# Patient Record
Sex: Female | Born: 1945 | Race: White | Hispanic: No | Marital: Married | Smoking: Never smoker
Health system: Southern US, Community
[De-identification: ages and names within clinical notes are randomized; demographics above are authoritative.]

## PROBLEM LIST (undated history)

## (undated) DIAGNOSIS — F32A Depression, unspecified: Secondary | ICD-10-CM

## (undated) DIAGNOSIS — E876 Hypokalemia: Secondary | ICD-10-CM

## (undated) DIAGNOSIS — I10 Essential (primary) hypertension: Secondary | ICD-10-CM

## (undated) DIAGNOSIS — E785 Hyperlipidemia, unspecified: Secondary | ICD-10-CM

## (undated) DIAGNOSIS — N83209 Unspecified ovarian cyst, unspecified side: Secondary | ICD-10-CM

## (undated) DIAGNOSIS — S069X9A Unspecified intracranial injury with loss of consciousness of unspecified duration, initial encounter: Secondary | ICD-10-CM

## (undated) DIAGNOSIS — S069XAA Unspecified intracranial injury with loss of consciousness status unknown, initial encounter: Secondary | ICD-10-CM

## (undated) DIAGNOSIS — E039 Hypothyroidism, unspecified: Secondary | ICD-10-CM

## (undated) DIAGNOSIS — K219 Gastro-esophageal reflux disease without esophagitis: Secondary | ICD-10-CM

## (undated) DIAGNOSIS — K589 Irritable bowel syndrome without diarrhea: Secondary | ICD-10-CM

## (undated) DIAGNOSIS — M62830 Muscle spasm of back: Secondary | ICD-10-CM

## (undated) DIAGNOSIS — G4730 Sleep apnea, unspecified: Secondary | ICD-10-CM

## (undated) DIAGNOSIS — J310 Chronic rhinitis: Secondary | ICD-10-CM

## (undated) DIAGNOSIS — N951 Menopausal and female climacteric states: Secondary | ICD-10-CM

## (undated) DIAGNOSIS — H8110 Benign paroxysmal vertigo, unspecified ear: Secondary | ICD-10-CM

## (undated) DIAGNOSIS — F329 Major depressive disorder, single episode, unspecified: Secondary | ICD-10-CM

## (undated) DIAGNOSIS — I509 Heart failure, unspecified: Secondary | ICD-10-CM

## (undated) DIAGNOSIS — N3281 Overactive bladder: Secondary | ICD-10-CM

## (undated) DIAGNOSIS — M1990 Unspecified osteoarthritis, unspecified site: Secondary | ICD-10-CM

## (undated) HISTORY — PX: REDUCTION MAMMAPLASTY: SUR839

## (undated) HISTORY — PX: OTHER SURGICAL HISTORY: SHX169

## (undated) HISTORY — PX: CATARACT EXTRACTION: SUR2

## (undated) HISTORY — PX: SHOULDER OPEN ROTATOR CUFF REPAIR: SHX2407

## (undated) HISTORY — PX: ANKLE RECONSTRUCTION: SHX1151

## (undated) HISTORY — PX: CHOLECYSTECTOMY: SHX55

## (undated) HISTORY — PX: SOFT TISSUE TUMOR RESECTION: SHX1054

## (undated) HISTORY — DX: Irritable bowel syndrome, unspecified: K58.9

## (undated) HISTORY — PX: JOINT REPLACEMENT: SHX530

## (undated) HISTORY — PX: BRACHIOPLASTY: SUR162

## (undated) HISTORY — PX: TOE SURGERY: SHX1073

---

## 2004-04-02 ENCOUNTER — Ambulatory Visit: Payer: Self-pay

## 2005-06-02 ENCOUNTER — Ambulatory Visit: Payer: Self-pay | Admitting: Internal Medicine

## 2005-07-22 ENCOUNTER — Ambulatory Visit: Payer: Self-pay | Admitting: Podiatry

## 2005-08-24 ENCOUNTER — Other Ambulatory Visit: Payer: Self-pay

## 2005-08-28 ENCOUNTER — Inpatient Hospital Stay: Payer: Self-pay | Admitting: Podiatry

## 2005-12-04 ENCOUNTER — Ambulatory Visit: Payer: Self-pay | Admitting: General Surgery

## 2006-06-11 ENCOUNTER — Ambulatory Visit: Payer: Self-pay | Admitting: General Practice

## 2006-08-13 ENCOUNTER — Ambulatory Visit: Payer: Self-pay | Admitting: Unknown Physician Specialty

## 2007-07-12 ENCOUNTER — Ambulatory Visit: Payer: Self-pay | Admitting: General Practice

## 2008-02-07 ENCOUNTER — Ambulatory Visit: Payer: Self-pay | Admitting: Internal Medicine

## 2008-02-22 ENCOUNTER — Ambulatory Visit: Payer: Self-pay | Admitting: Internal Medicine

## 2008-05-15 ENCOUNTER — Ambulatory Visit: Payer: Self-pay | Admitting: General Practice

## 2008-06-13 ENCOUNTER — Ambulatory Visit: Payer: Self-pay | Admitting: General Practice

## 2008-06-26 ENCOUNTER — Ambulatory Visit: Payer: Self-pay | Admitting: General Surgery

## 2008-07-03 ENCOUNTER — Ambulatory Visit: Payer: Self-pay | Admitting: General Surgery

## 2008-07-31 ENCOUNTER — Ambulatory Visit: Payer: Self-pay | Admitting: General Practice

## 2011-07-31 ENCOUNTER — Telehealth: Payer: Self-pay | Admitting: *Deleted

## 2011-07-31 NOTE — Telephone Encounter (Signed)
Pt states the ringing in her ears is still pretty bad and would like to know if you can send her something in for that.

## 2011-07-31 NOTE — Telephone Encounter (Signed)
Patient can try OTC anti-histamines such as benadryl, zyrtec, or claritin. Needs to have office visit if persists.

## 2011-08-03 NOTE — Telephone Encounter (Signed)
LM for pt

## 2014-07-12 HISTORY — PX: ABDOMINAL HYSTERECTOMY: SHX81

## 2015-03-25 DIAGNOSIS — N83209 Unspecified ovarian cyst, unspecified side: Secondary | ICD-10-CM

## 2015-03-25 HISTORY — DX: Unspecified ovarian cyst, unspecified side: N83.209

## 2015-04-10 DIAGNOSIS — M1712 Unilateral primary osteoarthritis, left knee: Secondary | ICD-10-CM | POA: Insufficient documentation

## 2015-08-21 DIAGNOSIS — M1711 Unilateral primary osteoarthritis, right knee: Secondary | ICD-10-CM | POA: Insufficient documentation

## 2016-03-04 DIAGNOSIS — E039 Hypothyroidism, unspecified: Secondary | ICD-10-CM | POA: Insufficient documentation

## 2016-03-04 DIAGNOSIS — H8110 Benign paroxysmal vertigo, unspecified ear: Secondary | ICD-10-CM

## 2016-03-04 DIAGNOSIS — I10 Essential (primary) hypertension: Secondary | ICD-10-CM | POA: Insufficient documentation

## 2016-03-04 DIAGNOSIS — E876 Hypokalemia: Secondary | ICD-10-CM | POA: Insufficient documentation

## 2016-03-04 DIAGNOSIS — S069X0S Unspecified intracranial injury without loss of consciousness, sequela: Secondary | ICD-10-CM | POA: Insufficient documentation

## 2016-03-04 DIAGNOSIS — F325 Major depressive disorder, single episode, in full remission: Secondary | ICD-10-CM | POA: Insufficient documentation

## 2016-03-04 DIAGNOSIS — Z0000 Encounter for general adult medical examination without abnormal findings: Secondary | ICD-10-CM | POA: Insufficient documentation

## 2016-03-04 DIAGNOSIS — R4189 Other symptoms and signs involving cognitive functions and awareness: Secondary | ICD-10-CM | POA: Insufficient documentation

## 2016-03-04 HISTORY — DX: Benign paroxysmal vertigo, unspecified ear: H81.10

## 2016-04-09 ENCOUNTER — Other Ambulatory Visit: Payer: Self-pay | Admitting: Obstetrics & Gynecology

## 2016-04-09 DIAGNOSIS — Z1239 Encounter for other screening for malignant neoplasm of breast: Secondary | ICD-10-CM

## 2016-04-09 DIAGNOSIS — N6315 Unspecified lump in the right breast, overlapping quadrants: Secondary | ICD-10-CM

## 2016-04-09 DIAGNOSIS — N6310 Unspecified lump in the right breast, unspecified quadrant: Secondary | ICD-10-CM

## 2016-04-28 ENCOUNTER — Other Ambulatory Visit: Payer: Self-pay | Admitting: *Deleted

## 2016-04-28 ENCOUNTER — Inpatient Hospital Stay
Admission: RE | Admit: 2016-04-28 | Discharge: 2016-04-28 | Disposition: A | Discharge status: Home / Self Care | Payer: Self-pay | Source: Ambulatory Visit | Attending: *Deleted | Admitting: *Deleted

## 2016-04-28 DIAGNOSIS — Z9289 Personal history of other medical treatment: Secondary | ICD-10-CM

## 2016-05-18 ENCOUNTER — Ambulatory Visit
Admission: RE | Admit: 2016-05-18 | Discharge: 2016-05-18 | Disposition: A | Discharge status: Home / Self Care | Payer: Medicare Other | Source: Ambulatory Visit | Attending: Obstetrics & Gynecology | Admitting: Obstetrics & Gynecology

## 2016-05-18 ENCOUNTER — Encounter: Payer: Self-pay | Admitting: Radiology

## 2016-05-18 DIAGNOSIS — N6315 Unspecified lump in the right breast, overlapping quadrants: Secondary | ICD-10-CM

## 2016-05-18 DIAGNOSIS — N6002 Solitary cyst of left breast: Secondary | ICD-10-CM | POA: Diagnosis not present

## 2016-05-18 DIAGNOSIS — N6310 Unspecified lump in the right breast, unspecified quadrant: Secondary | ICD-10-CM

## 2016-05-18 DIAGNOSIS — Z1231 Encounter for screening mammogram for malignant neoplasm of breast: Secondary | ICD-10-CM | POA: Diagnosis not present

## 2016-05-18 DIAGNOSIS — Z1239 Encounter for other screening for malignant neoplasm of breast: Secondary | ICD-10-CM

## 2016-06-03 DIAGNOSIS — Z6841 Body Mass Index (BMI) 40.0 and over, adult: Secondary | ICD-10-CM | POA: Insufficient documentation

## 2016-07-25 DIAGNOSIS — M545 Low back pain, unspecified: Secondary | ICD-10-CM | POA: Insufficient documentation

## 2016-09-16 DIAGNOSIS — M47816 Spondylosis without myelopathy or radiculopathy, lumbar region: Secondary | ICD-10-CM | POA: Insufficient documentation

## 2016-10-07 ENCOUNTER — Other Ambulatory Visit: Payer: Self-pay | Admitting: Orthopedic Surgery

## 2016-10-07 DIAGNOSIS — M48061 Spinal stenosis, lumbar region without neurogenic claudication: Secondary | ICD-10-CM

## 2016-10-28 ENCOUNTER — Ambulatory Visit
Admission: RE | Admit: 2016-10-28 | Discharge: 2016-10-28 | Disposition: A | Discharge status: Home / Self Care | Payer: Medicare Other | Source: Ambulatory Visit | Attending: Orthopedic Surgery | Admitting: Orthopedic Surgery

## 2016-10-28 DIAGNOSIS — M48061 Spinal stenosis, lumbar region without neurogenic claudication: Secondary | ICD-10-CM

## 2016-11-05 DIAGNOSIS — F419 Anxiety disorder, unspecified: Secondary | ICD-10-CM | POA: Insufficient documentation

## 2016-11-13 ENCOUNTER — Other Ambulatory Visit: Payer: Self-pay | Admitting: Orthopedic Surgery

## 2016-11-13 DIAGNOSIS — M9973 Connective tissue and disc stenosis of intervertebral foramina of lumbar region: Secondary | ICD-10-CM

## 2016-11-19 ENCOUNTER — Ambulatory Visit
Admission: RE | Admit: 2016-11-19 | Discharge: 2016-11-19 | Disposition: A | Discharge status: Home / Self Care | Payer: Medicare Other | Source: Ambulatory Visit | Attending: Orthopedic Surgery | Admitting: Orthopedic Surgery

## 2016-11-19 DIAGNOSIS — M9973 Connective tissue and disc stenosis of intervertebral foramina of lumbar region: Secondary | ICD-10-CM

## 2016-11-19 MED ORDER — IOPAMIDOL (ISOVUE-M 200) INJECTION 41%
1.0000 mL | Freq: Once | INTRAMUSCULAR | Status: AC
  Administered 2016-11-19: 1 mL via EPIDURAL

## 2016-11-19 MED ORDER — METHYLPREDNISOLONE ACETATE 40 MG/ML INJ SUSP (RADIOLOG
120.0000 mg | Freq: Once | INTRAMUSCULAR | Status: AC
  Administered 2016-11-19: 120 mg via EPIDURAL

## 2016-11-19 NOTE — Discharge Instructions (Signed)

## 2016-12-04 ENCOUNTER — Encounter: Payer: Self-pay | Admitting: *Deleted

## 2016-12-07 ENCOUNTER — Ambulatory Visit: Payer: Medicare Other | Admitting: Certified Registered Nurse Anesthetist

## 2016-12-07 ENCOUNTER — Ambulatory Visit
Admission: RE | Admit: 2016-12-07 | Discharge: 2016-12-07 | Disposition: A | Discharge status: Home / Self Care | Payer: Medicare Other | Source: Ambulatory Visit | Attending: Unknown Physician Specialty | Admitting: Unknown Physician Specialty

## 2016-12-07 ENCOUNTER — Encounter
Admission: RE | Disposition: A | Discharge status: Home / Self Care | Payer: Self-pay | Source: Ambulatory Visit | Attending: Unknown Physician Specialty

## 2016-12-07 DIAGNOSIS — Z79899 Other long term (current) drug therapy: Secondary | ICD-10-CM | POA: Diagnosis not present

## 2016-12-07 DIAGNOSIS — K5730 Diverticulosis of large intestine without perforation or abscess without bleeding: Secondary | ICD-10-CM | POA: Insufficient documentation

## 2016-12-07 DIAGNOSIS — K219 Gastro-esophageal reflux disease without esophagitis: Secondary | ICD-10-CM | POA: Diagnosis not present

## 2016-12-07 DIAGNOSIS — Z7982 Long term (current) use of aspirin: Secondary | ICD-10-CM | POA: Insufficient documentation

## 2016-12-07 DIAGNOSIS — M1990 Unspecified osteoarthritis, unspecified site: Secondary | ICD-10-CM | POA: Insufficient documentation

## 2016-12-07 DIAGNOSIS — K648 Other hemorrhoids: Secondary | ICD-10-CM | POA: Insufficient documentation

## 2016-12-07 DIAGNOSIS — Z7951 Long term (current) use of inhaled steroids: Secondary | ICD-10-CM | POA: Diagnosis not present

## 2016-12-07 DIAGNOSIS — G4730 Sleep apnea, unspecified: Secondary | ICD-10-CM | POA: Insufficient documentation

## 2016-12-07 DIAGNOSIS — Z1211 Encounter for screening for malignant neoplasm of colon: Secondary | ICD-10-CM | POA: Diagnosis present

## 2016-12-07 DIAGNOSIS — E785 Hyperlipidemia, unspecified: Secondary | ICD-10-CM | POA: Insufficient documentation

## 2016-12-07 DIAGNOSIS — Z7952 Long term (current) use of systemic steroids: Secondary | ICD-10-CM | POA: Diagnosis not present

## 2016-12-07 DIAGNOSIS — Z86010 Personal history of colonic polyps: Secondary | ICD-10-CM | POA: Diagnosis not present

## 2016-12-07 DIAGNOSIS — F329 Major depressive disorder, single episode, unspecified: Secondary | ICD-10-CM | POA: Insufficient documentation

## 2016-12-07 DIAGNOSIS — E039 Hypothyroidism, unspecified: Secondary | ICD-10-CM | POA: Diagnosis not present

## 2016-12-07 DIAGNOSIS — I10 Essential (primary) hypertension: Secondary | ICD-10-CM | POA: Insufficient documentation

## 2016-12-07 HISTORY — DX: Muscle spasm of back: M62.830

## 2016-12-07 HISTORY — DX: Menopausal and female climacteric states: N95.1

## 2016-12-07 HISTORY — DX: Major depressive disorder, single episode, unspecified: F32.9

## 2016-12-07 HISTORY — PX: COLONOSCOPY WITH PROPOFOL: SHX5780

## 2016-12-07 HISTORY — DX: Gastro-esophageal reflux disease without esophagitis: K21.9

## 2016-12-07 HISTORY — DX: Chronic rhinitis: J31.0

## 2016-12-07 HISTORY — DX: Unspecified intracranial injury with loss of consciousness status unknown, initial encounter: S06.9XAA

## 2016-12-07 HISTORY — DX: Unspecified intracranial injury with loss of consciousness of unspecified duration, initial encounter: S06.9X9A

## 2016-12-07 HISTORY — DX: Hyperlipidemia, unspecified: E78.5

## 2016-12-07 HISTORY — DX: Unspecified osteoarthritis, unspecified site: M19.90

## 2016-12-07 HISTORY — DX: Unspecified ovarian cyst, unspecified side: N83.209

## 2016-12-07 HISTORY — DX: Benign paroxysmal vertigo, unspecified ear: H81.10

## 2016-12-07 HISTORY — DX: Sleep apnea, unspecified: G47.30

## 2016-12-07 HISTORY — DX: Depression, unspecified: F32.A

## 2016-12-07 HISTORY — DX: Hypokalemia: E87.6

## 2016-12-07 HISTORY — DX: Hypothyroidism, unspecified: E03.9

## 2016-12-07 HISTORY — DX: Essential (primary) hypertension: I10

## 2016-12-07 HISTORY — DX: Overactive bladder: N32.81

## 2016-12-07 SURGERY — COLONOSCOPY WITH PROPOFOL
Anesthesia: General

## 2016-12-07 MED ORDER — MIDAZOLAM HCL 2 MG/2ML IJ SOLN
INTRAMUSCULAR | Status: AC
  Filled 2016-12-07: qty 2

## 2016-12-07 MED ORDER — PROPOFOL 500 MG/50ML IV EMUL
INTRAVENOUS | Status: AC
  Filled 2016-12-07: qty 50

## 2016-12-07 MED ORDER — PIPERACILLIN-TAZOBACTAM 3.375 G IVPB 30 MIN
3.3750 g | Freq: Once | INTRAVENOUS | Status: AC
  Administered 2016-12-07: 3.375 g via INTRAVENOUS
  Filled 2016-12-07: qty 50

## 2016-12-07 MED ORDER — SODIUM CHLORIDE 0.9 % IV SOLN: INTRAVENOUS | Status: DC

## 2016-12-07 MED ORDER — SODIUM CHLORIDE 0.9 % IV SOLN
INTRAVENOUS | Status: DC
  Administered 2016-12-07: 11:00:00 via INTRAVENOUS

## 2016-12-07 MED ORDER — MIDAZOLAM HCL 2 MG/2ML IJ SOLN
INTRAMUSCULAR | Status: DC | PRN
  Administered 2016-12-07: 2 mg via INTRAVENOUS

## 2016-12-07 MED ORDER — LIDOCAINE HCL (CARDIAC) 20 MG/ML IV SOLN
INTRAVENOUS | Status: DC | PRN
  Administered 2016-12-07: 40 mg via INTRAVENOUS

## 2016-12-07 MED ORDER — PROPOFOL 10 MG/ML IV BOLUS
INTRAVENOUS | Status: DC | PRN
Start: 2016-12-07 — End: 2016-12-07
  Administered 2016-12-07: 20 mg via INTRAVENOUS
  Administered 2016-12-07: 10 mg via INTRAVENOUS
  Administered 2016-12-07: 70 mg via INTRAVENOUS

## 2016-12-07 MED ORDER — PROPOFOL 500 MG/50ML IV EMUL
INTRAVENOUS | Status: DC | PRN
  Administered 2016-12-07: 140 ug/kg/min via INTRAVENOUS

## 2016-12-07 NOTE — Anesthesia Preprocedure Evaluation (Signed)
Anesthesia Evaluation  Patient identified by MRN, date of birth, ID band Patient awake    Reviewed: Allergy & Precautions, H&P , NPO status , Patient's Chart, lab work & pertinent test results, reviewed documented beta blocker date and time   Airway Mallampati: II   Neck ROM: full    Dental  (+) Poor Dentition   Pulmonary neg pulmonary ROS, sleep apnea ,    Pulmonary exam normal        Cardiovascular hypertension, negative cardio ROS Normal cardiovascular exam Rhythm:regular Rate:Normal     Neuro/Psych PSYCHIATRIC DISORDERS Depression  Neuromuscular disease negative neurological ROS  negative psych ROS   GI/Hepatic negative GI ROS, Neg liver ROS, GERD  Medicated,  Endo/Other  negative endocrine ROSHypothyroidism Morbid obesity  Renal/GU negative Renal ROS  negative genitourinary   Musculoskeletal  (+) Arthritis , Osteoarthritis,    Abdominal   Peds  Hematology negative hematology ROS (+)   Anesthesia Other Findings Past Medical History: 09/17/2016: Adenomatous polyp of colon 03/04/2016: Benign positional vertigo No date: Brain injury (HCC) No date: Climacteric No date: Depression No date: DJD (degenerative joint disease)     Comment: lumbar and cervical No date: Dyslipidemia No date: GERD (gastroesophageal reflux disease) No date: Hypertension No date: Hypokalemia No date: Hypothyroidism No date: OAB (overactive bladder) 03/25/2015: Ovarian cyst     Comment: 3cm simple cyst on right ovary No date: Rhinitis No date: Sleep apnea No date: Spasm of thoracolumbar muscle Past Surgical History: 07/12/2014: ABDOMINAL HYSTERECTOMY No date: ANKLE RECONSTRUCTION Left No date: BRACHIOPLASTY No date: CATARACT EXTRACTION No date: CHOLECYSTECTOMY 07/2014, 03/2015: JOINT REPLACEMENT Bilateral No date: nasal reconstrucion No date: REDUCTION MAMMAPLASTY Bilateral No date: SHOULDER OPEN ROTATOR CUFF REPAIR No date:  SOFT TISSUE TUMOR RESECTION     Comment: excision tumor soft tissue neck, and soft               tissue subcutaneous axilla No date: TOE SURGERY Right     Comment: reconstruction right foot 4th and 5th toe   Reproductive/Obstetrics negative OB ROS                             Anesthesia Physical Anesthesia Plan  ASA: III  Anesthesia Plan: General   Post-op Pain Management:    Induction:   PONV Risk Score and Plan:   Airway Management Planned:   Additional Equipment:   Intra-op Plan:   Post-operative Plan:   Informed Consent: I have reviewed the patients History and Physical, chart, labs and discussed the procedure including the risks, benefits and alternatives for the proposed anesthesia with the patient or authorized representative who has indicated his/her understanding and acceptance.   Dental Advisory Given  Plan Discussed with: CRNA  Anesthesia Plan Comments:         Anesthesia Quick Evaluation

## 2016-12-07 NOTE — Transfer of Care (Signed)
Immediate Anesthesia Transfer of Care Note  Patient: Robin HurlLinda J Hollifield [name]  Procedure(s) Performed: Procedure(s): COLONOSCOPY WITH PROPOFOL (N/A)  Patient Location: PACU  Anesthesia Type:General  Level of Consciousness: sedated  Airway & Oxygen Therapy: Patient Spontanous Breathing and Patient connected to nasal cannula oxygen  Post-op Assessment: Report given to RN and Post -op Vital signs reviewed and stable  Post vital signs: Reviewed and stable  Last Vitals:  Vitals:   12/07/16 1030  BP: (!) 150/74  Pulse: 74  Resp: 20  Temp: 36.6 C    Last Pain:  Vitals:   12/07/16 1030  TempSrc: Tympanic         Complications: No apparent anesthesia complications

## 2016-12-07 NOTE — H&P (Signed)
Primary Care Physician:  Lauro Regulus, MD Primary Gastroenterologist:  Dr. Mechele Collin  Pre-Procedure History & Physical: HPI:  [name] is a 71 y.o. female is here for an colonoscopy.   Past Medical History:  Diagnosis Date  . Adenomatous polyp of colon 09/17/2016  . Benign positional vertigo 03/04/2016  . Brain injury (HCC)   . Climacteric   . Depression   . 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 Dyslipidemia   . GERD (gastroesophageal reflux disease)   . Hypertension   . Hypokalemia   . Hypothyroidism   . OAB (overactive bladder)   . Ovarian cyst 03/25/2015   3cm simple cyst on right ovary  . Rhinitis   . Sleep apnea   . Spasm of thoracolumbar muscle     Past Surgical History:  Procedure Laterality Date  . ABDOMINAL HYSTERECTOMY  07/12/2014  . ANKLE RECONSTRUCTION Left   . BRACHIOPLASTY    . CATARACT EXTRACTION    . CHOLECYSTECTOMY    . JOINT REPLACEMENT Bilateral 07/2014, 03/2015  . nasal reconstrucion    . REDUCTION MAMMAPLASTY Bilateral   . SHOULDER OPEN ROTATOR CUFF REPAIR    . SOFT TISSUE TUMOR RESECTION     excision tumor soft tissue neck, and soft tissue subcutaneous axilla  . TOE SURGERY Right    reconstruction right foot 4th and 5th toe    Prior to Admission medications   Medication Sig Start Date End Date Taking? Authorizing Provider  amantadine (SYMMETREL) 100 MG capsule Take 100 mg by mouth 2 (two) times daily.   Yes [provider]  amLODipine (NORVASC) 5 MG tablet Take 5 mg by mouth daily.   Yes [provider]  aspirin EC 81 MG tablet Take 81 mg by mouth daily.   Yes [provider]  baclofen (LIORESAL) 20 MG tablet Take 20 mg by mouth daily as needed for muscle spasms.   Yes [provider]  BUPROPION HCL PO Take by mouth daily.   Yes [provider]  carvedilol (COREG) 25 MG tablet Take 25 mg by mouth 2 (two) times daily with a meal.   Yes [provider]  celecoxib (CELEBREX) 200 MG capsule Take 200 mg by mouth 2 (two) times daily.   Yes [provider]  donepezil (ARICEPT) 10 MG tablet Take 10 mg by mouth at bedtime.   Yes [provider]  esomeprazole (NEXIUM) 40 MG capsule Take 40 mg by mouth daily.   Yes [provider]  fluticasone (FLONASE) 50 MCG/ACT nasal spray Place 2 sprays into both nostrils daily.   Yes [provider]  folic acid (FOLVITE) 400 MCG tablet Take 400 mcg by mouth 2 (two) times daily.   Yes [provider]  furosemide (LASIX) 20 MG tablet Take 20 mg by mouth daily as needed.   Yes [provider]  levothyroxine (SYNTHROID, LEVOTHROID) 112 MCG tablet Take 112 mcg by mouth daily.   Yes [provider]  losartan (COZAAR) 100 MG tablet Take 100 mg by mouth daily.   Yes [provider]  Melatonin 3 MG TABS Take 1 tablet by mouth at bedtime.   Yes [provider]  montelukast (SINGULAIR) 10 MG tablet Take 10 mg by mouth every evening.   Yes [provider]  Multiple Vitamin (MULTIVITAMIN) tablet Take 1 tablet by mouth daily.   Yes [provider]  potassium chloride SA (K-DUR,KLOR-CON) 20 MEQ tablet Take 20 mEq  by mouth 3 (three) times daily.   Yes [provider]  traMADol (ULTRAM) 50 MG tablet Take by mouth every 12 (twelve) hours.   Yes [provider]  traZODone (DESYREL) 50 MG tablet Take by mouth at bedtime. 50-150mg  nightly as needed   Yes [provider]  venlafaxine (EFFEXOR) 75 MG tablet Take 75 mg by mouth 3 (three) times daily.   Yes [provider]    Allergies as of 09/28/2016  . (Not on File)    Family History  Problem Relation Age of Onset  . Breast cancer Other     Social History   Social History  . Marital status: Legally Separated    Spouse name: N/A  . Number of children: N/A  . Years of education: N/A   Occupational History  . Not on file.    Social History Main Topics  . Smoking status: Never Smoker  . Smokeless tobacco: Never Used  . Alcohol use 1.2 oz/week    2 Glasses of wine per week     Comment: per week  . Drug use: No  . Sexual activity: Yes   Other Topics Concern  . Not on file   Social History Narrative  . No narrative on file    Review of Systems: See HPI, otherwise negative ROS  Physical Exam: BP (!) 150/74   Pulse 74   Temp 97.9 F (36.6 C) (Tympanic)   Resp 20   Ht 5\' 6"  (1.676 m)   Wt 122.5 kg (270 lb)   SpO2 98%   BMI 43.58 kg/m  General:   Alert,  pleasant and cooperative in NAD Head:  Normocephalic and atraumatic. Neck:  Supple; no masses or thyromegaly. Lungs:  Clear throughout to auscultation.    Heart:  Regular rate and rhythm. Abdomen:  Soft, nontender and nondistended. Normal bowel sounds, without guarding, and without rebound.  Somewhat obese abdomen. Neurologic:  Alert and  oriented x4;  grossly normal neurologically.  Impression/Plan: [name] [name] is here for an colonoscopy to [facility].  Risks, benefits, limitations, and alternatives regarding  colonoscopy have been reviewed with the patient.  Questions have been answered.  All parties agreeable.   Lynnae PrudeELLIOTT, ROBERT, MD  12/07/2016, 10:59 AM

## 2016-12-07 NOTE — Anesthesia Postprocedure Evaluation (Signed)
Anesthesia Post Note  Patient: [name]  Procedure(s) Performed: Procedure(s) (LRB): COLONOSCOPY WITH PROPOFOL (N/A)  Patient location during evaluation: PACU Anesthesia Type: General Level of consciousness: awake and alert Pain management: pain level controlled Vital Signs Assessment: post-procedure vital signs reviewed and stable Respiratory status: spontaneous breathing, nonlabored ventilation, respiratory function stable and patient connected to nasal cannula oxygen Cardiovascular status: blood pressure returned to baseline and stable Postop Assessment: no signs of nausea or vomiting Anesthetic complications: no     Last Vitals:  Vitals:   12/07/16 1124 12/07/16 1154  BP: 124/67 (!) 159/76  Pulse: 75 80  Resp: 13 17  Temp: 36.7 C     Last Pain:  Vitals:   12/07/16 1124  TempSrc: Tympanic                 Yevette EdwardsJames G Tawfiq Favila

## 2016-12-07 NOTE — Anesthesia Procedure Notes (Addendum)
Performed by: Demarco Bacci Pre-anesthesia Checklist: Patient identified, Emergency Drugs available, Suction available, Patient being monitored and Timeout performed Patient Re-evaluated:Patient Re-evaluated prior to inductionOxygen Delivery Method: Nasal cannula Intubation Type: IV induction Ventilation: Oral airway inserted - appropriate to patient size       

## 2016-12-07 NOTE — Anesthesia Post-op Follow-up Note (Cosign Needed)
Anesthesia QCDR form completed.        

## 2016-12-07 NOTE — Op Note (Signed)
[facility] Gastroenterology Patient Name: Robin ButteryLinda Hollifield [name] Procedure Date: 12/07/2016 10:59 AM MRN: [id number] Account #: [id number] Date of Birth: 11/13/1945 Admit Type: Outpatient Age: [age] [facility] Gender: Female Note Status: Finalized Procedure:            Colonoscopy Indications:          High risk colon cancer surveillance: Personal history                        of colonic polyps Providers:            Scot Junobert T. Vegas Fritze, MD Referring MD:         Marya AmslerMarshall W. Dareen PianoAnderson MD, MD (Referring MD) Medicines:            Propofol per Anesthesia Complications:        No immediate complications. Procedure:            Pre-Anesthesia Assessment:                       - After reviewing the risks and benefits, the patient                        was deemed in satisfactory condition to undergo the                        procedure.                       After obtaining informed consent, the colonoscope was                        passed under direct vision. Throughout the procedure,                        the patient's blood pressure, pulse, and oxygen                        saturations were monitored continuously. The                        Colonoscope was introduced through the anus and                        advanced to the the cecum, identified by appendiceal                        orifice and ileocecal valve. The colonoscopy was                        performed without difficulty. The patient tolerated the                        procedure well. The quality of the bowel preparation                        was excellent. Findings:      A few small-mouthed diverticula were found in the sigmoid colon.      Internal hemorrhoids were found during endoscopy. The hemorrhoids were       small and Grade I (internal hemorrhoids that do not prolapse).      The  exam was otherwise without abnormality. Impression:           - Diverticulosis in the sigmoid colon.                     - Internal hemorrhoids.                       - The examination was otherwise normal.                       - No specimens collected. Recommendation:       - Repeat colonoscopy in 5 years for screening purposes. Scot Jun, MD 12/07/2016 11:23:43 AM This report has been signed electronically. Number of Addenda: 0 Note Initiated On: 12/07/2016 10:59 AM Scope Withdrawal Time: 0 hours 9 minutes 33 seconds  Total Procedure Duration: 0 hours 15 minutes 42 seconds       [facility]

## 2016-12-17 ENCOUNTER — Other Ambulatory Visit: Payer: Self-pay | Admitting: Orthopedic Surgery

## 2016-12-17 DIAGNOSIS — M48061 Spinal stenosis, lumbar region without neurogenic claudication: Secondary | ICD-10-CM

## 2016-12-24 ENCOUNTER — Ambulatory Visit
Admission: RE | Admit: 2016-12-24 | Discharge: 2016-12-24 | Disposition: A | Discharge status: Home / Self Care | Payer: Medicare Other | Source: Ambulatory Visit | Attending: Orthopedic Surgery | Admitting: Orthopedic Surgery

## 2016-12-24 DIAGNOSIS — M48061 Spinal stenosis, lumbar region without neurogenic claudication: Secondary | ICD-10-CM

## 2016-12-24 MED ORDER — IOPAMIDOL (ISOVUE-M 200) INJECTION 41%
1.0000 mL | Freq: Once | INTRAMUSCULAR | Status: AC
  Administered 2016-12-24: 1 mL via EPIDURAL

## 2016-12-24 MED ORDER — METHYLPREDNISOLONE ACETATE 40 MG/ML INJ SUSP (RADIOLOG
120.0000 mg | Freq: Once | INTRAMUSCULAR | Status: AC
  Administered 2016-12-24: 120 mg via EPIDURAL

## 2016-12-24 NOTE — Discharge Instructions (Signed)

## 2017-01-27 ENCOUNTER — Other Ambulatory Visit: Payer: Self-pay | Admitting: Orthopedic Surgery

## 2017-01-27 DIAGNOSIS — M9973 Connective tissue and disc stenosis of intervertebral foramina of lumbar region: Secondary | ICD-10-CM

## 2017-02-04 ENCOUNTER — Ambulatory Visit
Admission: RE | Admit: 2017-02-04 | Discharge: 2017-02-04 | Disposition: A | Discharge status: Home / Self Care | Payer: Medicare Other | Source: Ambulatory Visit | Attending: Orthopedic Surgery | Admitting: Orthopedic Surgery

## 2017-02-04 DIAGNOSIS — M9973 Connective tissue and disc stenosis of intervertebral foramina of lumbar region: Secondary | ICD-10-CM

## 2017-02-04 MED ORDER — IOPAMIDOL (ISOVUE-M 200) INJECTION 41%
1.0000 mL | Freq: Once | INTRAMUSCULAR | Status: AC
  Administered 2017-02-04: 1 mL via EPIDURAL

## 2017-02-04 MED ORDER — METHYLPREDNISOLONE ACETATE 40 MG/ML INJ SUSP (RADIOLOG
120.0000 mg | Freq: Once | INTRAMUSCULAR | Status: AC
  Administered 2017-02-04: 120 mg via EPIDURAL

## 2017-02-04 NOTE — Discharge Instructions (Signed)

## 2017-04-20 ENCOUNTER — Encounter: Payer: Self-pay | Admitting: Student in an Organized Health Care Education/Training Program

## 2017-04-20 ENCOUNTER — Ambulatory Visit
Payer: Medicare Other | Attending: Student in an Organized Health Care Education/Training Program | Admitting: Student in an Organized Health Care Education/Training Program

## 2017-04-20 VITALS — BP 136/70 | HR 76 | Temp 97.9°F | Resp 16 | Ht 63.0 in | Wt 294.0 lb

## 2017-04-20 DIAGNOSIS — K589 Irritable bowel syndrome without diarrhea: Secondary | ICD-10-CM | POA: Diagnosis not present

## 2017-04-20 DIAGNOSIS — E785 Hyperlipidemia, unspecified: Secondary | ICD-10-CM | POA: Insufficient documentation

## 2017-04-20 DIAGNOSIS — Z87442 Personal history of urinary calculi: Secondary | ICD-10-CM | POA: Insufficient documentation

## 2017-04-20 DIAGNOSIS — Z7982 Long term (current) use of aspirin: Secondary | ICD-10-CM | POA: Insufficient documentation

## 2017-04-20 DIAGNOSIS — G4730 Sleep apnea, unspecified: Secondary | ICD-10-CM | POA: Insufficient documentation

## 2017-04-20 DIAGNOSIS — E876 Hypokalemia: Secondary | ICD-10-CM | POA: Diagnosis not present

## 2017-04-20 DIAGNOSIS — Z79899 Other long term (current) drug therapy: Secondary | ICD-10-CM | POA: Diagnosis not present

## 2017-04-20 DIAGNOSIS — M545 Low back pain: Secondary | ICD-10-CM | POA: Diagnosis present

## 2017-04-20 DIAGNOSIS — F329 Major depressive disorder, single episode, unspecified: Secondary | ICD-10-CM | POA: Diagnosis not present

## 2017-04-20 DIAGNOSIS — K219 Gastro-esophageal reflux disease without esophagitis: Secondary | ICD-10-CM | POA: Insufficient documentation

## 2017-04-20 DIAGNOSIS — M5136 Other intervertebral disc degeneration, lumbar region: Secondary | ICD-10-CM | POA: Insufficient documentation

## 2017-04-20 DIAGNOSIS — N3281 Overactive bladder: Secondary | ICD-10-CM | POA: Diagnosis not present

## 2017-04-20 DIAGNOSIS — G8929 Other chronic pain: Secondary | ICD-10-CM | POA: Diagnosis not present

## 2017-04-20 DIAGNOSIS — M47816 Spondylosis without myelopathy or radiculopathy, lumbar region: Secondary | ICD-10-CM | POA: Diagnosis not present

## 2017-04-20 DIAGNOSIS — E039 Hypothyroidism, unspecified: Secondary | ICD-10-CM | POA: Insufficient documentation

## 2017-04-20 DIAGNOSIS — M48061 Spinal stenosis, lumbar region without neurogenic claudication: Secondary | ICD-10-CM | POA: Insufficient documentation

## 2017-04-20 DIAGNOSIS — Z6841 Body Mass Index (BMI) 40.0 and over, adult: Secondary | ICD-10-CM | POA: Diagnosis not present

## 2017-04-20 NOTE — Progress Notes (Signed)
Patient's Name: [name]  MRN: [id number]  Referring Provider: Meade Maw, MD  DOB: 1946-01-09  PCP: Kirk Ruths, MD  DOS: 04/20/2017  Note by: Gillis Santa, MD  Service setting: Ambulatory outpatient  Specialty: Interventional Pain Management  Location: [facility] (AMB) Pain Management Facility  Visit type: Initial Patient Evaluation  Patient type: New Patient   Primary Reason(s) for Visit: Encounter for initial evaluation of one or more chronic problems (new to examiner) potentially causing chronic pain, and posing a threat to normal musculoskeletal function. (Level of risk: High) CC: Back Pain (lower more on left than right)  HPI  [name] is a 71 y.o. year old, female patient, who comes today to see [name] for the first time for an initial evaluation of her chronic pain. She has Lumbar spondylosis; Lumbar facet arthropathy; and Lumbar degenerative disc disease on their problem list. Today she comes in for evaluation of her Back Pain (lower more on left than right)  Pain Assessment: Location: Lower, Left Back Radiating: some times pain goes down the back of left leg to the calf Onset: More than a month ago Duration: Chronic pain Quality: Aching, Constant, Radiating, Sharp(sharp pain when turns her body) Severity: 9 /10 (self-reported pain score)  Note: Reported level is inconsistent with clinical observations. Clinically the patient looks like a 3/10 A 3/10 is viewed as "Moderate" and described as significantly interfering with activities of daily living (ADL). It becomes difficult to feed, bathe, get dressed, get on and off the toilet or to perform personal hygiene functions. Difficult to get in and out of bed or a chair without assistance. Very distracting. With effort, it can be ignored when deeply involved in activities.       When using our objective Pain Scale, levels between 6 and 10/10 are said to belong in an emergency room, as it progressively  worsens from a 6/10, described as severely limiting, requiring emergency care not usually available at an outpatient pain management facility. At a 6/10 level, communication becomes difficult and requires great effort. Assistance to reach the emergency department may be required. Facial flushing and profuse sweating along with potentially dangerous increases in heart rate and blood pressure will be evident. Effect on ADL: pace self Timing: Constant Modifying factors: medicine, rest, ice and heating pad,   Onset and Duration: Sudden and Present longer than 3 months Cause of pain: Unknown Severity: Getting worse, NAS-11 at its worse: 10/10, NAS-11 at its best: 8/10, NAS-11 now: 8/10 and NAS-11 on the average: 8/10 Timing: Evening Aggravating Factors: Bending, Lifiting, Prolonged standing, Twisting and Walking uphill Alleviating Factors: Hot packs, Lying down, Medications, Resting, Sitting and Sleeping Associated Problems: Depression, Fatigue, Inability to concentrate, Inability to control bowel, Personality changes, Sadness, Swelling, Weakness and Pain that does not allow patient to sleep Quality of Pain: Aching, Agonizing, Constant, Cruel, Deep, Disabling, Distressing, Dreadful, Exhausting, Feeling of weight, Getting longer, Heavy, Horrible, Nagging, Pressure-like, Pulsating, Punishing, Throbbing, Tiring and Uncomfortable Previous Examinations or Tests: MRI scan, Neurosurgical evaluation and Orthopedic evaluation Previous Treatments: Epidural steroid injections, Pool exercises and Steroid treatments by mouth  The patient comes into the clinics today for the first time for a chronic pain management evaluation.   71 year old female with a history of depression, morbid obesity, obstructive sleep apnea who presents with axial low back pain, left greater than right.  Patient's pain has been chronic in nature but has acutely worsened over the last 6-8 weeks.  She notes difficulty in standing up  erect and  walking for an extended period of time.  Sitting and supine helps her.  Patient has tried physical therapy in the past which was effective for her leg pain but not her back pain.  She is currently on Nucynta 50 mg which she takes half tablet daily.  Patient is also on tramadol, baclofen, Celebrex.  Medication management per PCP.  She has had 3 epidural steroid injections in the past the best of which lasted 2.5 weeks.  Today I took the time to provide the patient with information regarding my pain practice. The patient was informed that my practice is divided into two sections: an interventional pain management section, as well as a completely separate and distinct medication management section. I explained that I have procedure days for my interventional therapies, and evaluation days for follow-ups and medication management. Because of the amount of documentation required during both, they are kept separated. This means that there is the possibility that she may be scheduled for a procedure on one day, and medication management the next. I have also informed her that because of staffing and facility limitations, I no longer take patients for medication management only. To illustrate the reasons for this, I gave the patient the example of surgeons, and how inappropriate it would be to refer a patient to his/her care, just to write for the post-surgical antibiotics on a surgery done by a different surgeon.   Because interventional pain management is my board-certified specialty, the patient was informed that joining my practice means that they are open to any and all interventional therapies. I made it clear that this does not mean that they will be forced to have any procedures done. What this means is that I believe interventional therapies to be essential part of the diagnosis and proper management of chronic pain conditions. Therefore, patients not interested in these interventional alternatives will be  better served under the care of a different practitioner.  The patient was also made aware of my Comprehensive Pain Management Safety Guidelines where by joining my practice, they limit all of their nerve blocks and joint injections to those done by our practice, for as long as we are retained to manage their care.   Historic Controlled Substance Pharmacotherapy Review  PMP and historical list of controlled substances: Nucynta 50 mg, half tablet daily as needed, tramadol 50 mg twice daily as needed MME/day: Less than 10 mg/day Medications: The patient did not bring the medication(s) to the appointment, as requested in our "New Patient Package" Pharmacodynamics: Desired effects: Analgesia: The patient reports >50% benefit. Reported improvement in function: The patient reports medication allows her to accomplish basic ADLs. Clinically meaningful improvement in function (CMIF): Sustained CMIF goals met Perceived effectiveness: Described as relatively effective, allowing for increase in activities of daily living (ADL) Undesirable effects: Side-effects or Adverse reactions: None reported Historical Monitoring: The patient  reports that she does not use drugs. List of all UDS Test(s): No results found for: MDMA, COCAINSCRNUR, PCPSCRNUR, PCPQUANT, CANNABQUANT, THCU, [place] List of all Serum Drug Screening Test(s):  No results found for: AMPHSCRSER, BARBSCRSER, BENZOSCRSER, COCAINSCRSER, PCPSCRSER, PCPQUANT, THCSCRSER, CANNABQUANT, OPIATESCRSER, OXYSCRSER, PROPOXSCRSER Historical Background Evaluation: [place] PMP: Six (6) year initial data search conducted.             [place] Department of public safety, offender search: Editor, commissioning Information) Non-contributory Risk Assessment Profile: Aberrant behavior: None observed or detected today Risk factors for fatal opioid overdose: None identified today Fatal overdose hazard ratio (HR): Calculation  deferred Non-fatal overdose hazard ratio (HR): Calculation  deferred Risk of opioid abuse or dependence: 0.7-3.0% with doses ? 36 MME/day and 6.1-26% with doses ? 120 MME/day. Substance use disorder (SUD) risk level: Pending results of Medical Psychology Evaluation for SUD Opioid risk tool (ORT) (Total Score): 2 Opioid Risk Tool - 04/20/17 1147      Family History of Substance Abuse   Alcohol  Negative    Illegal Drugs  Negative    Rx Drugs  Negative      Personal History of Substance Abuse   Alcohol  Negative    Illegal Drugs  Negative    Rx Drugs  Negative      Age   Age between 61-45 years   Yes      Psychological Disease   Psychological Disease  Negative    Depression  Positive on medication   on medication     Total Score   Opioid Risk Tool Scoring  2    Opioid Risk Interpretation  Low Risk      ORT Scoring interpretation table:  Score <3 = Low Risk for SUD  Score between 4-7 = Moderate Risk for SUD  Score >8 = High Risk for Opioid Abuse   PHQ-2 Depression Scale:  Total score: 0  PHQ-2 Scoring interpretation table: (Score and probability of major depressive disorder)  Score 0 = No depression  Score 1 = 15.4% Probability  Score 2 = 21.1% Probability  Score 3 = 38.4% Probability  Score 4 = 45.5% Probability  Score 5 = 56.4% Probability  Score 6 = 78.6% Probability   PHQ-9 Depression Scale:  Total score: 0  PHQ-9 Scoring interpretation table:  Score 0-4 = No depression  Score 5-9 = Mild depression  Score 10-14 = Moderate depression  Score 15-19 = Moderately severe depression  Score 20-27 = Severe depression (2.4 times higher risk of SUD and 2.89 times higher risk of overuse)   Pharmacologic Plan: Medication management per PCP.            Interventional plan as below: diagnostic lumbar facet blocks  Meds   Current Outpatient Medications:  .  amantadine (SYMMETREL) 100 MG capsule, Take 100 mg by mouth 2 (two) times daily., Disp: , Rfl:  .  amLODipine (NORVASC) 5 MG tablet, Take 5 mg by mouth daily., Disp: , Rfl:   .  Ascorbic Acid (VITAMIN C) 1000 MG tablet, Take 1,000 mg daily by mouth., Disp: , Rfl:  .  aspirin EC 81 MG tablet, Take 81 mg by mouth daily., Disp: , Rfl:  .  azelastine (ASTELIN) 0.1 % nasal spray, 2 sprays as needed for rhinitis. Use in each nostril as directed, Disp: , Rfl:  .  B Complex Vitamins (VITAMIN-B COMPLEX PO), Take 100 mg daily by mouth., Disp: , Rfl:  .  baclofen (LIORESAL) 20 MG tablet, Take 20 mg by mouth daily as needed for muscle spasms., Disp: , Rfl:  .  carvedilol (COREG) 25 MG tablet, Take 25 mg 4 (four) times daily by mouth. , Disp: , Rfl:  .  celecoxib (CELEBREX) 200 MG capsule, Take 200 mg by mouth 2 (two) times daily., Disp: , Rfl:  .  Cholecalciferol (VITAMIN D3) 2000 units TABS, Take 2,000 Units daily by mouth., Disp: , Rfl:  .  donepezil (ARICEPT) 10 MG tablet, Take 10 mg by mouth at bedtime., Disp: , Rfl:  .  esomeprazole (NEXIUM) 40 MG capsule, Take 40 mg by mouth daily., Disp: , Rfl:  .  folic acid (FOLVITE) 774 MCG tablet, Take 400 mcg by mouth 2 (two) times daily., Disp: , Rfl:  .  levothyroxine (SYNTHROID, LEVOTHROID) 112 MCG tablet, Take 112 mcg by mouth daily., Disp: , Rfl:  .  losartan (COZAAR) 100 MG tablet, Take 100 mg by mouth daily., Disp: , Rfl:  .  Melatonin 3 MG TABS, Take 1 tablet at bedtime by mouth. , Disp: , Rfl:  .  montelukast (SINGULAIR) 10 MG tablet, Take 10 mg by mouth every evening., Disp: , Rfl:  .  Multiple Vitamin (MULTIVITAMIN) tablet, Take 1 tablet by mouth daily., Disp: , Rfl:  .  OnabotulinumtoxinA (BOTOX IJ), Inject every 4 (four) months as directed., Disp: , Rfl:  .  potassium chloride SA (K-DUR,KLOR-CON) 20 MEQ tablet, Take 20 mEq by mouth 3 (three) times daily., Disp: , Rfl:  .  tapentadol (NUCYNTA) 50 MG tablet, Take 50 mg daily by mouth., Disp: , Rfl:  .  torsemide (DEMADEX) 10 MG tablet, Take 10 mg daily by mouth., Disp: , Rfl:  .  traMADol (ULTRAM) 50 MG tablet, Take 50 mg daily by mouth. , Disp: , Rfl:  .  traZODone  (DESYREL) 50 MG tablet, Take by mouth at bedtime. 50-152m nightly as needed, Disp: , Rfl:  .  venlafaxine (EFFEXOR) 75 MG tablet, Take 75 mg by mouth 3 (three) times daily., Disp: , Rfl:  .  BUPROPION HCL PO, Take by mouth daily., Disp: , Rfl:  .  fluticasone (FLONASE) 50 MCG/ACT nasal spray, Place 2 sprays into both nostrils daily., Disp: , Rfl:  .  furosemide (LASIX) 20 MG tablet, Take 20 mg by mouth daily as needed., Disp: , Rfl:   Imaging Review    Lumbosacral Imaging: Lumbar MR wo contrast:  Results for orders placed during the hospital encounter of 10/28/16  MR LUMBAR SPINE WO CONTRAST   Narrative CLINICAL DATA:  L4-5 spinal stenosis. Chronic low back and left leg pain.  EXAM: MRI LUMBAR SPINE WITHOUT CONTRAST  TECHNIQUE: Multiplanar, multisequence MR imaging of the lumbar spine was performed. No intravenous contrast was administered.  COMPARISON:  None.  FINDINGS: Segmentation: Normal lumbar segmentation is assumed, with the lowest fully formed disc space designated L5-S1.  Alignment: Mild lumbar dextroscoliosis with apex at L3. Trace retrolisthesis of L1 on L2.  Vertebrae: Preserved vertebral body heights without evidence of fracture. 1.2 x 1.0 cm T1 hypointense, T2 hyperintense cystic focus in the posterior left T12 vertebral body and pedicle has a chronic appearance with surrounding fatty marrow change and no significant edema, indeterminate but possibly an intraosseous ganglion or meningeal cyst. Advanced disc degeneration is present at L1-2 greater than L2-3 with type 2 endplate changes at both levels and mild type 1 change at L1-2.  Conus medullaris: Extends to the L1 level and appears normal.  Paraspinal and other soft tissues: Asymmetric left renal atrophy. Partially visualized 3.9 cm cyst in the right pelvis/adnexa.  Disc levels:  T11-12: Only imaged sagittally. Disc desiccation and moderate disc space narrowing. Minimal disc bulging without  stenosis.  T12-L1: Disc desiccation and mild disc space narrowing. Minimal disc bulging without stenosis.  L1-2: Disc desiccation and severe disc space narrowing. Disc bulging results in mild left lateral recess stenosis without spinal or neural foraminal stenosis.  L2-3: Disc desiccation and moderate left-sided disc space narrowing. Mild disc bulging without significant stenosis.  L3-4: Disc desiccation and mild disc space narrowing. Mild disc bulging without stenosis.  L4-5:  Mild facet arthrosis without disc herniation or stenosis.  L5-S1: Moderate facet arthrosis and at most minimal disc bulging without stenosis. Right facet joint effusion.  IMPRESSION: 1. Lumbar dextroscoliosis. 2. Advanced disc degeneration at L1-2 with mild left lateral recess stenosis. 3. Disc degeneration elsewhere as above without significant stenosis. 4. Moderate L5-S1 facet arthrosis. 5. 3.9 cm right adnexal cyst. Recommend pelvic ultrasound for further evaluation.   Electronically Signed   By: Logan Bores M.D.   On: 10/28/2016 17:29     Complexity Note: Imaging results reviewed. Results shared with [name], using Layman's terms.                         ROS  Cardiovascular History: Daily Aspirin intake and High blood pressure Pulmonary or Respiratory History: Snoring  and Coughing up mucus (Bronchitis) Neurological History: Curved spine and Incontinence:  Urinary Review of Past Neurological Studies: No results found for this or any previous visit. Psychological-Psychiatric History: Anxiousness, Depressed and Difficulty sleeping and or falling asleep Gastrointestinal History: Reflux or heatburn and Alternating episodes iof diarrhea and constipation (IBS-Irritable bowe syndrome) Genitourinary History: No reported renal or genitourinary signs or symptoms such as difficulty voiding or producing urine, peeing blood, non-functioning kidney, kidney stones, difficulty emptying the  bladder, difficulty controlling the flow of urine, or chronic kidney disease Hematological History: No reported hematological signs or symptoms such as prolonged bleeding, low or poor functioning platelets, bruising or bleeding easily, hereditary bleeding problems, low energy levels due to low hemoglobin or being anemic Endocrine History: Slow thyroid Rheumatologic History: Joint aches and or swelling due to excess weight (Osteoarthritis) Musculoskeletal History: Negative for myasthenia gravis, muscular dystrophy, multiple sclerosis or malignant hyperthermia Work History: Retired  Allergies  [name] is allergic to codeine; hydromorphone; latex; oxycodone-acetaminophen; sulfa antibiotics; bupropion; hydrocodone; morphine and related; and procaine.  Laboratory Chemistry  Inflammation Markers (CRP: Acute Phase) (ESR: Chronic Phase) No results found for: CRP, ESRSEDRATE               Renal Function Markers No results found for: BUN, CREATININE, GFRAA, GFRNONAA               Hepatic Function Markers No results found for: AST, ALT, ALBUMIN, ALKPHOS, HCVAB               Electrolytes No results found for: NA, K, CL, CALCIUM, MG               Neuropathy Markers No results found for: [id number]               Bone Pathology Markers No results found for: [place], VD125OH2TOT, [id number], [id number], 25OHVITD1, 25OHVITD2, 25OHVITD3, CALCIUM, TESTOFREE, TESTOSTERONE               Coagulation Parameters No results found for: INR, LABPROT, APTT, PLT               Cardiovascular Markers No results found for: BNP, HGB, HCT               Note: Lab results reviewed.  [place]  Drug: [name]  reports that she does not use drugs. Alcohol:  reports that she drinks about 1.2 oz of alcohol per week. Tobacco:  reports that  has never smoked. she has never used smokeless tobacco. 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 Family: family history includes Breast cancer in her other.  Past Surgical History:  Procedure Laterality Date  . ABDOMINAL HYSTERECTOMY  07/12/2014  . ANKLE RECONSTRUCTION Left   . BRACHIOPLASTY    . CATARACT EXTRACTION    . CHOLECYSTECTOMY    . JOINT REPLACEMENT Bilateral 07/2014, 03/2015  . nasal reconstrucion    . REDUCTION MAMMAPLASTY Bilateral   . SHOULDER OPEN ROTATOR CUFF REPAIR    . SOFT TISSUE TUMOR RESECTION     excision tumor soft tissue neck, and soft tissue subcutaneous axilla  . TOE SURGERY Right    reconstruction right foot 4th and 5th toe   Active Ambulatory Problems    Diagnosis Date Noted  . Lumbar spondylosis 04/20/2017  . Lumbar facet arthropathy 04/20/2017  . Lumbar degenerative disc disease 04/20/2017   Resolved Ambulatory Problems    Diagnosis Date Noted  . No Resolved Ambulatory Problems   Past Medical History:  Diagnosis Date  . Adenomatous polyp of colon 09/17/2016  . Benign positional vertigo 03/04/2016  . 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 Climacteric   . Depression   . DJD (degenerative joint disease)   . Dyslipidemia   . GERD (gastroesophageal reflux disease)   . Hypertension   . Hypokalemia   . Hypothyroidism   . IBS (irritable bowel syndrome)   . OAB (overactive bladder)   . Ovarian cyst 03/25/2015  . Rhinitis   . Sleep apnea   . Spasm of thoracolumbar muscle    Constitutional Exam  General appearance: morbidly obese and Well nourished, well developed, and well hydrated. In no apparent acute distress Vitals:   04/20/17 1117  BP: 136/70  Pulse: 76  Resp: 16  Temp: 97.9 F (36.6 C)  SpO2:  96%  Weight: 294 lb (133.4 kg)  Height: 5' 3"  (1.6 m)   BMI Assessment: Estimated body mass index is 52.08 kg/m as calculated from the following:   Height as of this encounter: 5' 3"  (1.6 m).   Weight as of this encounter: 294 lb (133.4 kg).  BMI interpretation table: BMI level Category Range association with higher incidence of chronic pain  <18 kg/m2 Underweight   18.5-24.9 kg/m2 Ideal body weight   25-29.9 kg/m2 Overweight Increased incidence by 20%  30-34.9 kg/m2 Obese (Class I) Increased incidence by 68%  35-39.9 kg/m2 Severe obesity (Class II) Increased incidence by 136%  >40 kg/m2 Extreme obesity (Class III) Increased incidence by 254%   BMI Readings from Last 4 Encounters:  04/20/17 52.08 kg/m  12/07/16 43.58 kg/m   Wt Readings from Last 4 Encounters:  04/20/17 294 lb (133.4 kg)  12/07/16 270 lb (122.5 kg)  Psych/Mental status: Alert, oriented x 3 (person, place, & time)       Eyes: PERLA Respiratory: No evidence of acute respiratory distress  Cervical Spine Area Exam  Skin & Axial Inspection: No masses, redness, edema, swelling, or associated skin lesions Alignment: Symmetrical Functional ROM: Unrestricted ROM      Stability: No instability detected Muscle Tone/Strength: Functionally intact. No obvious neuro-muscular anomalies detected. Sensory (Neurological): Unimpaired Palpation: No palpable anomalies              Upper Extremity (UE) Exam    Side: Right upper extremity  Side: Left upper extremity  Skin & Extremity Inspection: Skin color, temperature, and hair growth are WNL. No peripheral edema or cyanosis. No masses, redness, swelling, asymmetry, or  associated skin lesions. No contractures.  Skin & Extremity Inspection: Skin color, temperature, and hair growth are WNL. No peripheral edema or cyanosis. No masses, redness, swelling, asymmetry, or associated skin lesions. No contractures.  Functional ROM: Unrestricted ROM          Functional ROM: Unrestricted ROM           Muscle Tone/Strength: Functionally intact. No obvious neuro-muscular anomalies detected.  Muscle Tone/Strength: Functionally intact. No obvious neuro-muscular anomalies detected.  Sensory (Neurological): Unimpaired          Sensory (Neurological): Unimpaired          Palpation: No palpable anomalies              Palpation: No palpable anomalies              Specialized Test(s): Deferred         Specialized Test(s): Deferred          Thoracic Spine Area Exam  Skin & Axial Inspection: No masses, redness, or swelling Alignment: Symmetrical Functional ROM: Unrestricted ROM Stability: No instability detected Muscle Tone/Strength: Functionally intact. No obvious neuro-muscular anomalies detected. Sensory (Neurological): Unimpaired Muscle strength & Tone: No palpable anomalies  Lumbar Spine Area Exam  Skin & Axial Inspection: No masses, redness, or swelling Alignment: Symmetrical Functional ROM: Diminished ROM      Stability: No instability detected Muscle Tone/Strength: Functionally intact. No obvious neuro-muscular anomalies detected. Sensory (Neurological): Unimpaired Palpation: No palpable anomalies       Provocative Tests: Lumbar Hyperextension and rotation test: Positive bilaterally for facet joint pain. Lumbar Lateral bending test: Positive due to pain. Patrick's Maneuver: Positive                    Gait & Posture Assessment  Ambulation: Unassisted Gait: Relatively normal for age and body habitus Posture: WNL   Lower Extremity Exam    Side: Right lower extremity  Side: Left lower extremity  Skin & Extremity Inspection: Skin color, temperature, and hair growth are WNL. No peripheral edema or cyanosis. No masses, redness, swelling, asymmetry, or associated skin lesions. No contractures.  Skin & Extremity Inspection: Skin color, temperature, and hair growth are WNL. No peripheral edema or cyanosis. No masses, redness, swelling, asymmetry, or associated skin lesions. No  contractures.  Functional ROM: Unrestricted ROM          Functional ROM: Unrestricted ROM          Muscle Tone/Strength: Functionally intact. No obvious neuro-muscular anomalies detected.  Muscle Tone/Strength: Functionally intact. No obvious neuro-muscular anomalies detected.  Sensory (Neurological): Unimpaired  Sensory (Neurological): Unimpaired  Palpation: No palpable anomalies  Palpation: No palpable anomalies   Assessment  Primary Diagnosis & Pertinent Problem List: The primary encounter diagnosis was Lumbar spondylosis. Diagnoses of Lumbar facet arthropathy and Lumbar degenerative disc disease were also pertinent to this visit.  Visit Diagnosis (New problems to examiner): 1. Lumbar spondylosis   2. Lumbar facet arthropathy   3. Lumbar degenerative disc disease    General Recommendations: The pain condition that the patient suffers from is best treated with a multidisciplinary approach that involves an increase in physical activity to prevent de-conditioning and worsening of the pain cycle, as well as psychological counseling (formal and/or informal) to address the co-morbid psychological affects of pain. Treatment will often involve judicious use of pain medications and interventional procedures to decrease the pain, allowing the patient to participate in the physical activity that will ultimately produce long-lasting pain reductions. The goal  of the multidisciplinary approach is to return the patient to a higher level of overall function and to restore their ability to perform activities of daily living.   71 year old female who presents with axial low back pain, bilateral that is chronic in nature but has acutely worsened over the last 1-2 months.  Patient has tried 3 epidural steroid injections in the past without significant benefit.  She has completed physical therapy which was effective for her leg pain but not her back pain.  Patient's lumbar MRI shows facet arthropathy and facet  arthrosis most pronounced at L4-L5 and L5-S1.  Discussed risks and benefits of diagnostic lumbar facet blocks at L3/4, L4/5, L5/S1 bilaterally with goal radiofrequency ablation.  Patient would like to proceed.    Plan: -Continue medication management with PCP -Bilateral diagnostic lumbar facet medial branch nerve blocks at  at L3/4, L4/5, L5/S1  Future: -Bilateral sacroiliac joint injection -lumbar paraspinal trigger point injection -Bilateral lumbar erector spinae block  Orders Placed This Encounter  Procedures  . LUMBAR FACET(MEDIAL BRANCH NERVE BLOCK) MBNB    Standing Status:   Future    Standing Expiration Date:   05/20/2017    Scheduling Instructions:     Side: Bilateral     Level: L3/4, L4/5, L5/S1 facet blocks      Sedation: With Sedation.     Timeframe: ASAP-hopefully 11/12 Monday    Order Specific Question:   Where will this procedure be performed?    Answer:   [facility]    Provider-requested follow-up: Return for Procedure.  Future Appointments  Date Time Provider [place]  04/26/2017  8:45 AM Gillis Santa, MD [facility] None    Primary Care Physician: Kirk Ruths, MD Location: [facility] Note by: Gillis Santa, M.D, Date: 04/20/2017; Time: 2:56 PM  Patient Instructions   GENERAL RISKS AND COMPLICATIONS  What are the risk, side effects and possible complications? Generally speaking, most procedures are safe.  However, with any procedure there are risks, side effects, and the possibility of complications.  The risks and complications are dependent upon the sites that are lesioned, or the type of nerve block to be performed.  The closer the procedure is to the spine, the more serious the risks are.  Great care is taken when placing the radio frequency needles, block needles or lesioning probes, but sometimes complications can occur. 1. Infection: Any time there is an injection through the skin, there is a risk  of infection.  This is why sterile conditions are used for these blocks.  There are four possible types of infection. 1. Localized skin infection. 2. Central Nervous System Infection-This can be in the form of Meningitis, which can be deadly. 3. Epidural Infections-This can be in the form of an epidural abscess, which can cause pressure inside of the spine, causing compression of the spinal cord with subsequent paralysis. This would require an emergency surgery to decompress, and there are no guarantees that the patient would recover from the paralysis. 4. Discitis-This is an infection of the intervertebral discs.  It occurs in about 1% of discography procedures.  It is difficult to treat and it may lead to surgery.        2. Pain: the needles have to go through skin and soft tissues, will cause soreness.       3. Damage to internal structures:  The nerves to be lesioned may be near blood vessels or    other nerves which can be potentially  damaged.       4. Bleeding: Bleeding is more common if the patient is taking blood thinners such as  aspirin, Coumadin, Ticiid, Plavix, etc., or if he/she have some genetic predisposition  such as hemophilia. Bleeding into the spinal canal can cause compression of the spinal  cord with subsequent paralysis.  This would require an emergency surgery to  decompress and there are no guarantees that the patient would recover from the  paralysis.       5. Pneumothorax:  Puncturing of a lung is a possibility, every time a needle is introduced in  the area of the chest or upper back.  Pneumothorax refers to free air around the  collapsed lung(s), inside of the thoracic cavity (chest cavity).  Another two possible  complications related to a similar event would include: Hemothorax and Chylothorax.   These are variations of the Pneumothorax, where instead of air around the collapsed  lung(s), you may have blood or chyle, respectively.       6. Spinal headaches: They may occur  with any procedures in the area of the spine.       7. Persistent CSF (Cerebro-Spinal Fluid) leakage: This is a rare problem, but may occur  with prolonged intrathecal or epidural catheters either due to the formation of a fistulous  track or a dural tear.       8. Nerve damage: By working so close to the spinal cord, there is always a possibility of  nerve damage, which could be as serious as a permanent spinal cord injury with  paralysis.       9. Death:  Although rare, severe deadly allergic reactions known as "Anaphylactic  reaction" can occur to any of the medications used.      10. Worsening of the symptoms:  We can always make thing worse.  What are the chances of something like this happening? Chances of any of this occuring are extremely low.  By statistics, you have more of a chance of getting killed in a motor vehicle accident: while driving to the hospital than any of the above occurring .  Nevertheless, you should be aware that they are possibilities.  In general, it is similar to taking a shower.  Everybody knows that you can slip, hit your head and get killed.  Does that mean that you should not shower again?  Nevertheless always keep in mind that statistics do not mean anything if you happen to be on the wrong side of them.  Even if a procedure has a 1 (one) in a 1,000,000 (million) chance of going wrong, it you happen to be that one..Also, keep in mind that by statistics, you have more of a chance of having something go wrong when taking medications.  Who should not have this procedure? If you are on a blood thinning medication (e.g. Coumadin, Plavix, see list of "Blood Thinners"), or if you have an active infection going on, you should not have the procedure.  If you are taking any blood thinners, please inform your physician.  How should I prepare for this procedure?  Do not eat or drink anything at least six hours prior to the procedure.  Bring a driver with you .  It cannot be a  taxi.  Come accompanied by an adult that can drive you back, and that is strong enough to help you if your legs get weak or numb from the local anesthetic.  Take all of your medicines the morning  of the procedure with just enough water to swallow them.  If you have diabetes, make sure that you are scheduled to have your procedure done first thing in the morning, whenever possible.  If you have diabetes, take only half of your insulin dose and notify our nurse that you have done so as soon as you arrive at the clinic.  If you are diabetic, but only take blood sugar pills (oral hypoglycemic), then do not take them on the morning of your procedure.  You may take them after you have had the procedure.  Do not take aspirin or any aspirin-containing medications, at least eleven (11) days prior to the procedure.  They may prolong bleeding.  Wear loose fitting clothing that may be easy to take off and that you would not mind if it got stained with Betadine or blood.  Do not wear any jewelry or perfume  Remove any nail coloring.  It will interfere with some of our monitoring equipment.  NOTE: Remember that this is not meant to be interpreted as a complete list of all possible complications.  Unforeseen problems may occur.  BLOOD THINNERS The following drugs contain aspirin or other products, which can cause increased bleeding during surgery and should not be taken for 2 weeks prior to and 1 week after surgery.  If you should need take something for relief of minor pain, you may take acetaminophen which is found in Tylenol,m Datril, Anacin-3 and Panadol. It is not blood thinner. The products listed below are.  Do not take any of the products listed below in addition to any listed on your instruction sheet.  A.P.C or A.P.C with Codeine Codeine Phosphate Capsules #3 Ibuprofen Ridaura  ABC compound Congesprin Imuran rimadil  Advil Cope Indocin Robaxisal  Alka-Seltzer Effervescent Pain Reliever and  Antacid Coricidin or Coricidin-D  Indomethacin Rufen  Alka-Seltzer plus Cold Medicine Cosprin Ketoprofen S-A-C Tablets  Anacin Analgesic Tablets or Capsules Coumadin Korlgesic Salflex  Anacin Extra Strength Analgesic tablets or capsules CP-2 Tablets Lanoril Salicylate  Anaprox Cuprimine Capsules Levenox Salocol  Anexsia-D Dalteparin Magan Salsalate  Anodynos Darvon compound Magnesium Salicylate Sine-off  Ansaid Dasin Capsules Magsal Sodium Salicylate  Anturane Depen Capsules Marnal Soma  APF Arthritis pain formula Dewitt's Pills Measurin Stanback  Argesic Dia-Gesic Meclofenamic Sulfinpyrazone  Arthritis Bayer Timed Release Aspirin Diclofenac Meclomen Sulindac  Arthritis pain formula Anacin Dicumarol Medipren Supac  Analgesic (Safety coated) Arthralgen Diffunasal Mefanamic Suprofen  Arthritis Strength Bufferin Dihydrocodeine Mepro Compound Suprol  Arthropan liquid Dopirydamole Methcarbomol with Aspirin Synalgos  ASA tablets/Enseals Disalcid Micrainin Tagament  Ascriptin Doan's Midol Talwin  Ascriptin A/D Dolene Mobidin Tanderil  Ascriptin Extra Strength Dolobid Moblgesic Ticlid  Ascriptin with Codeine Doloprin or Doloprin with Codeine Momentum Tolectin  Asperbuf Duoprin Mono-gesic Trendar  Aspergum Duradyne Motrin or Motrin IB Triminicin  Aspirin plain, buffered or enteric coated Durasal Myochrisine Trigesic  Aspirin Suppositories Easprin Nalfon Trillsate  Aspirin with Codeine Ecotrin Regular or Extra Strength Naprosyn Uracel  Atromid-S Efficin Naproxen Ursinus  Auranofin Capsules Elmiron Neocylate Vanquish  Axotal Emagrin Norgesic Verin  Azathioprine Empirin or Empirin with Codeine Normiflo Vitamin E  Azolid Emprazil Nuprin Voltaren  Bayer Aspirin plain, buffered or children's or timed BC Tablets or powders Encaprin Orgaran Warfarin Sodium  Buff-a-Comp Enoxaparin Orudis Zorpin  Buff-a-Comp with Codeine Equegesic Os-Cal-Gesic   Buffaprin Excedrin plain, buffered or Extra Strength  Oxalid   Bufferin Arthritis Strength Feldene Oxphenbutazone   Bufferin plain or Extra Strength Feldene Capsules Oxycodone with Aspirin   Bufferin  with Codeine Fenoprofen Fenoprofen Pabalate or Pabalate-SF   Buffets II Flogesic Panagesic   Buffinol plain or Extra Strength Florinal or Florinal with Codeine Panwarfarin   Buf-Tabs Flurbiprofen Penicillamine   Butalbital Compound Four-way cold tablets Penicillin   Butazolidin Fragmin Pepto-Bismol   Carbenicillin Geminisyn Percodan   Carna Arthritis Reliever Geopen Persantine   Carprofen Gold's salt Persistin   Chloramphenicol Goody's Phenylbutazone   Chloromycetin Haltrain Piroxlcam   Clmetidine heparin Plaquenil   Cllnoril Hyco-pap Ponstel   Clofibrate Hydroxy chloroquine Propoxyphen         Before stopping any of these medications, be sure to consult the physician who ordered them.  Some, such as Coumadin (Warfarin) are ordered to prevent or treat serious conditions such as "deep thrombosis", "pumonary embolisms", and other heart problems.  The amount of time that you may need off of the medication may also vary with the medication and the reason for which you were taking it.  If you are taking any of these medications, please make sure you notify your pain physician before you undergo any procedures.         Facet Blocks Patient Information  Description: The facets are joints in the spine between the vertebrae.  Like any joints in the body, facets can become irritated and painful.  Arthritis can also effect the facets.  By injecting steroids and local anesthetic in and around these joints, we can temporarily block the nerve supply to them.  Steroids act directly on irritated nerves and tissues to reduce selling and inflammation which often leads to decreased pain.  Facet blocks may be done anywhere along the spine from the neck to the low back depending upon the location of your pain.   After numbing the skin with local anesthetic (like  Novocaine), a small needle is passed onto the facet joints under x-ray guidance.  You may experience a sensation of pressure while this is being done.  The entire block usually lasts about 15-25 minutes.   Conditions which may be treated by facet blocks:   Low back/buttock pain  Neck/shoulder pain  Certain types of headaches  Preparation for the injection:  1. Do not eat any solid food or dairy products within 8 hours of your appointment. 2. You may drink clear liquid up to 3 hours before appointment.  Clear liquids include water, black coffee, juice or soda.  No milk or cream please. 3. You may take your regular medication, including pain medications, with a sip of water before your appointment.  Diabetics should hold regular insulin (if taken separately) and take 1/2 normal NPH dose the morning of the procedure.  Carry some sugar containing items with you to your appointment. 4. A driver must accompany you and be prepared to drive you home after your procedure. 5. Bring all your current medications with you. 6. An IV may be inserted and sedation may be given at the discretion of the physician. 7. A blood pressure cuff, EKG and other monitors will often be applied during the procedure.  Some patients may need to have extra oxygen administered for a short period. 8. You will be asked to provide medical information, including your allergies and medications, prior to the procedure.  We must know immediately if you are taking blood thinners (like Coumadin/Warfarin) or if you are allergic to IV iodine contrast (dye).  We must know if you could possible be pregnant.  Possible side-effects:   Bleeding from needle site  Infection (rare, may require surgery)  Nerve injury (rare)  Numbness & tingling (temporary)  Difficulty urinating (rare, temporary)  Spinal headache (a headache worse with upright posture)  Light-headedness (temporary)  Pain at injection site (serveral  days)  Decreased blood pressure (rare, temporary)  Weakness in arm/leg (temporary)  Pressure sensation in back/neck (temporary)   Call if you experience:   Fever/chills associated with headache or increased back/neck pain  Headache worsened by an upright position  New onset, weakness or numbness of an extremity below the injection site  Hives or difficulty breathing (go to the emergency room)  Inflammation or drainage at the injection site(s)  Severe back/neck pain greater than usual  New symptoms which are concerning to you  Please note:  Although the local anesthetic injected can often make your back or neck feel good for several hours after the injection, the pain will likely return. It takes 3-7 days for steroids to work.  You may not notice any pain relief for at least one week.  If effective, we will often do a series of 2-3 injections spaced 3-6 weeks apart to maximally decrease your pain.  After the initial series, you may be a candidate for a more permanent nerve block of the facets.  If you have any questions, please call #[phone]) [facility]

## 2017-04-20 NOTE — Patient Instructions (Signed)
GENERAL RISKS AND COMPLICATIONS  What are the risk, side effects and possible complications? Generally speaking, most procedures are safe.  However, with any procedure there are risks, side effects, and the possibility of complications.  The risks and complications are dependent upon the sites that are lesioned, or the type of nerve block to be performed.  The closer the procedure is to the spine, the more serious the risks are.  Great care is taken when placing the radio frequency needles, block needles or lesioning probes, but sometimes complications can occur. 1. Infection: Any time there is an injection through the skin, there is a risk of infection.  This is why sterile conditions are used for these blocks.  There are four possible types of infection. 1. Localized skin infection. 2. Central Nervous System Infection-This can be in the form of Meningitis, which can be deadly. 3. Epidural Infections-This can be in the form of an epidural abscess, which can cause pressure inside of the spine, causing compression of the spinal cord with subsequent paralysis. This would require an emergency surgery to decompress, and there are no guarantees that the patient would recover from the paralysis. 4. Discitis-This is an infection of the intervertebral discs.  It occurs in about 1% of discography procedures.  It is difficult to treat and it may lead to surgery.        2. Pain: the needles have to go through skin and soft tissues, will cause soreness.       3. Damage to internal structures:  The nerves to be lesioned may be near blood vessels or    other nerves which can be potentially damaged.       4. Bleeding: Bleeding is more common if the patient is taking blood thinners such as  aspirin, Coumadin, Ticiid, Plavix, etc., or if he/she have some genetic predisposition  such as hemophilia. Bleeding into the spinal canal can cause compression of the spinal  cord with subsequent paralysis.  This would require an  emergency surgery to  decompress and there are no guarantees that the patient would recover from the  paralysis.       5. Pneumothorax:  Puncturing of a lung is a possibility, every time a needle is introduced in  the area of the chest or upper back.  Pneumothorax refers to free air around the  collapsed lung(s), inside of the thoracic cavity (chest cavity).  Another two possible  complications related to a similar event would include: Hemothorax and Chylothorax.   These are variations of the Pneumothorax, where instead of air around the collapsed  lung(s), you may have blood or chyle, respectively.       6. Spinal headaches: They may occur with any procedures in the area of the spine.       7. Persistent CSF (Cerebro-Spinal Fluid) leakage: This is a rare problem, but may occur  with prolonged intrathecal or epidural catheters either due to the formation of a fistulous  track or a dural tear.       8. Nerve damage: By working so close to the spinal cord, there is always a possibility of  nerve damage, which could be as serious as a permanent spinal cord injury with  paralysis.       9. Death:  Although rare, severe deadly allergic reactions known as "Anaphylactic  reaction" can occur to any of the medications used.      10. Worsening of the symptoms:  We can always make thing worse.    What are the chances of something like this happening? Chances of any of this occuring are extremely low.  By statistics, you have more of a chance of getting killed in a motor vehicle accident: while driving to the hospital than any of the above occurring .  Nevertheless, you should be aware that they are possibilities.  In general, it is similar to taking a shower.  Everybody knows that you can slip, hit your head and get killed.  Does that mean that you should not shower again?  Nevertheless always keep in mind that statistics do not mean anything if you happen to be on the wrong side of them.  Even if a procedure has a 1  (one) in a 1,000,000 (million) chance of going wrong, it you happen to be that one..Also, keep in mind that by statistics, you have more of a chance of having something go wrong when taking medications.  Who should not have this procedure? If you are on a blood thinning medication (e.g. Coumadin, Plavix, see list of "Blood Thinners"), or if you have an active infection going on, you should not have the procedure.  If you are taking any blood thinners, please inform your physician.  How should I prepare for this procedure?  Do not eat or drink anything at least six hours prior to the procedure.  Bring a driver with you .  It cannot be a taxi.  Come accompanied by an adult that can drive you back, and that is strong enough to help you if your legs get weak or numb from the local anesthetic.  Take all of your medicines the morning of the procedure with just enough water to swallow them.  If you have diabetes, make sure that you are scheduled to have your procedure done first thing in the morning, whenever possible.  If you have diabetes, take only half of your insulin dose and notify our nurse that you have done so as soon as you arrive at the clinic.  If you are diabetic, but only take blood sugar pills (oral hypoglycemic), then do not take them on the morning of your procedure.  You may take them after you have had the procedure.  Do not take aspirin or any aspirin-containing medications, at least eleven (11) days prior to the procedure.  They may prolong bleeding.  Wear loose fitting clothing that may be easy to take off and that you would not mind if it got stained with Betadine or blood.  Do not wear any jewelry or perfume  Remove any nail coloring.  It will interfere with some of our monitoring equipment.  NOTE: Remember that this is not meant to be interpreted as a complete list of all possible complications.  Unforeseen problems may occur.  BLOOD THINNERS The following drugs  contain aspirin or other products, which can cause increased bleeding during surgery and should not be taken for 2 weeks prior to and 1 week after surgery.  If you should need take something for relief of minor pain, you may take acetaminophen which is found in Tylenol,m Datril, Anacin-3 and Panadol. It is not blood thinner. The products listed below are.  Do not take any of the products listed below in addition to any listed on your instruction sheet.  A.P.C or A.P.C with Codeine Codeine Phosphate Capsules #3 Ibuprofen Ridaura  ABC compound Congesprin Imuran rimadil  Advil Cope Indocin Robaxisal  Alka-Seltzer Effervescent Pain Reliever and Antacid Coricidin or Coricidin-D  Indomethacin Rufen    Alka-Seltzer plus Cold Medicine Cosprin Ketoprofen S-A-C Tablets  Anacin Analgesic Tablets or Capsules Coumadin Korlgesic Salflex  Anacin Extra Strength Analgesic tablets or capsules CP-2 Tablets Lanoril Salicylate  Anaprox Cuprimine Capsules Levenox Salocol  Anexsia-D Dalteparin Magan Salsalate  Anodynos Darvon compound Magnesium Salicylate Sine-off  Ansaid Dasin Capsules Magsal Sodium Salicylate  Anturane Depen Capsules Marnal Soma  APF Arthritis pain formula Dewitt's Pills Measurin Stanback  Argesic Dia-Gesic Meclofenamic Sulfinpyrazone  Arthritis Bayer Timed Release Aspirin Diclofenac Meclomen Sulindac  Arthritis pain formula Anacin Dicumarol Medipren Supac  Analgesic (Safety coated) Arthralgen Diffunasal Mefanamic Suprofen  Arthritis Strength Bufferin Dihydrocodeine Mepro Compound Suprol  Arthropan liquid Dopirydamole Methcarbomol with Aspirin Synalgos  ASA tablets/Enseals Disalcid Micrainin Tagament  Ascriptin Doan's Midol Talwin  Ascriptin A/D Dolene Mobidin Tanderil  Ascriptin Extra Strength Dolobid Moblgesic Ticlid  Ascriptin with Codeine Doloprin or Doloprin with Codeine Momentum Tolectin  Asperbuf Duoprin Mono-gesic Trendar  Aspergum Duradyne Motrin or Motrin IB Triminicin  Aspirin  plain, buffered or enteric coated Durasal Myochrisine Trigesic  Aspirin Suppositories Easprin Nalfon Trillsate  Aspirin with Codeine Ecotrin Regular or Extra Strength Naprosyn Uracel  Atromid-S Efficin Naproxen Ursinus  Auranofin Capsules Elmiron Neocylate Vanquish  Axotal Emagrin Norgesic Verin  Azathioprine Empirin or Empirin with Codeine Normiflo Vitamin E  Azolid Emprazil Nuprin Voltaren  Bayer Aspirin plain, buffered or children's or timed BC Tablets or powders Encaprin Orgaran Warfarin Sodium  Buff-a-Comp Enoxaparin Orudis Zorpin  Buff-a-Comp with Codeine Equegesic Os-Cal-Gesic   Buffaprin Excedrin plain, buffered or Extra Strength Oxalid   Bufferin Arthritis Strength Feldene Oxphenbutazone   Bufferin plain or Extra Strength Feldene Capsules Oxycodone with Aspirin   Bufferin with Codeine Fenoprofen Fenoprofen Pabalate or Pabalate-SF   Buffets II Flogesic Panagesic   Buffinol plain or Extra Strength Florinal or Florinal with Codeine Panwarfarin   Buf-Tabs Flurbiprofen Penicillamine   Butalbital Compound Four-way cold tablets Penicillin   Butazolidin Fragmin Pepto-Bismol   Carbenicillin Geminisyn Percodan   Carna Arthritis Reliever Geopen Persantine   Carprofen Gold's salt Persistin   Chloramphenicol Goody's Phenylbutazone   Chloromycetin Haltrain Piroxlcam   Clmetidine heparin Plaquenil   Cllnoril Hyco-pap Ponstel   Clofibrate Hydroxy chloroquine Propoxyphen         Before stopping any of these medications, be sure to consult the physician who ordered them.  Some, such as Coumadin (Warfarin) are ordered to prevent or treat serious conditions such as "deep thrombosis", "pumonary embolisms", and other heart problems.  The amount of time that you may need off of the medication may also vary with the medication and the reason for which you were taking it.  If you are taking any of these medications, please make sure you notify your pain physician before you undergo any  procedures.         Facet Blocks Patient Information  Description: The facets are joints in the spine between the vertebrae.  Like any joints in the body, facets can become irritated and painful.  Arthritis can also effect the facets.  By injecting steroids and local anesthetic in and around these joints, we can temporarily block the nerve supply to them.  Steroids act directly on irritated nerves and tissues to reduce selling and inflammation which often leads to decreased pain.  Facet blocks may be done anywhere along the spine from the neck to the low back depending upon the location of your pain.   After numbing the skin with local anesthetic (like Novocaine), a small needle is passed onto the facet joints under x-ray guidance.    You may experience a sensation of pressure while this is being done.  The entire block usually lasts about 15-25 minutes.   Conditions which may be treated by facet blocks:   Low back/buttock pain  Neck/shoulder pain  Certain types of headaches  Preparation for the injection:  1. Do not eat any solid food or dairy products within 8 hours of your appointment. 2. You may drink clear liquid up to 3 hours before appointment.  Clear liquids include water, black coffee, juice or soda.  No milk or cream please. 3. You may take your regular medication, including pain medications, with a sip of water before your appointment.  Diabetics should hold regular insulin (if taken separately) and take 1/2 normal NPH dose the morning of the procedure.  Carry some sugar containing items with you to your appointment. 4. A driver must accompany you and be prepared to drive you home after your procedure. 5. Bring all your current medications with you. 6. An IV may be inserted and sedation may be given at the discretion of the physician. 7. A blood pressure cuff, EKG and other monitors will often be applied during the procedure.  Some patients may need to have extra oxygen  administered for a short period. 8. You will be asked to provide medical information, including your allergies and medications, prior to the procedure.  We must know immediately if you are taking blood thinners (like Coumadin/Warfarin) or if you are allergic to IV iodine contrast (dye).  We must know if you could possible be pregnant.  Possible side-effects:   Bleeding from needle site  Infection (rare, may require surgery)  Nerve injury (rare)  Numbness & tingling (temporary)  Difficulty urinating (rare, temporary)  Spinal headache (a headache worse with upright posture)  Light-headedness (temporary)  Pain at injection site (serveral days)  Decreased blood pressure (rare, temporary)  Weakness in arm/leg (temporary)  Pressure sensation in back/neck (temporary)   Call if you experience:   Fever/chills associated with headache or increased back/neck pain  Headache worsened by an upright position  New onset, weakness or numbness of an extremity below the injection site  Hives or difficulty breathing (go to the emergency room)  Inflammation or drainage at the injection site(s)  Severe back/neck pain greater than usual  New symptoms which are concerning to you  Please note:  Although the local anesthetic injected can often make your back or neck feel good for several hours after the injection, the pain will likely return. It takes 3-7 days for steroids to work.  You may not notice any pain relief for at least one week.  If effective, we will often do a series of 2-3 injections spaced 3-6 weeks apart to maximally decrease your pain.  After the initial series, you may be a candidate for a more permanent nerve block of the facets.  If you have any questions, please call [phone] [facility] 

## 2017-04-20 NOTE — Progress Notes (Signed)
Nursing Pain Medication Assessment:  Safety precautions to be maintained throughout the outpatient stay will include: orient to surroundings, keep bed in low position, maintain call bell within reach at all times, provide assistance with transfer out of bed and ambulation.  Medication Inspection Compliance: Pill count conducted under aseptic conditions, in front of the patient. Neither the pills nor the bottle was removed from the patient's sight at any time. Once count was completed pills were immediately returned to the patient in their original bottle.FYI : Patient started taking nucynta approx 6 weeks ago.   Medication: Tapentadol (Nucynta) Pill/Patch Count: 53 of 90 pills remain Pill/Patch Appearance: Markings consistent with prescribed medication Bottle Appearance: Standard pharmacy container. Clearly labeled. Filled Date: 03 / 28/ 2017 Last Medication intake:  Today

## 2017-04-26 ENCOUNTER — Encounter: Payer: Self-pay | Admitting: Student in an Organized Health Care Education/Training Program

## 2017-04-26 ENCOUNTER — Other Ambulatory Visit: Payer: Self-pay

## 2017-04-26 ENCOUNTER — Ambulatory Visit (HOSPITAL_BASED_OUTPATIENT_CLINIC_OR_DEPARTMENT_OTHER): Payer: Medicare Other | Admitting: Student in an Organized Health Care Education/Training Program

## 2017-04-26 ENCOUNTER — Ambulatory Visit
Admission: RE | Admit: 2017-04-26 | Discharge: 2017-04-26 | Disposition: A | Discharge status: Home / Self Care | Payer: Medicare Other | Source: Ambulatory Visit | Attending: Student in an Organized Health Care Education/Training Program | Admitting: Student in an Organized Health Care Education/Training Program

## 2017-04-26 VITALS — BP 137/67 | HR 76 | Temp 97.2°F | Resp 14 | Ht 63.0 in | Wt 290.0 lb

## 2017-04-26 DIAGNOSIS — M5136 Other intervertebral disc degeneration, lumbar region: Secondary | ICD-10-CM | POA: Insufficient documentation

## 2017-04-26 DIAGNOSIS — Z96611 Presence of right artificial shoulder joint: Secondary | ICD-10-CM | POA: Insufficient documentation

## 2017-04-26 DIAGNOSIS — Z9049 Acquired absence of other specified parts of digestive tract: Secondary | ICD-10-CM | POA: Insufficient documentation

## 2017-04-26 DIAGNOSIS — M47816 Spondylosis without myelopathy or radiculopathy, lumbar region: Secondary | ICD-10-CM | POA: Diagnosis not present

## 2017-04-26 DIAGNOSIS — Z885 Allergy status to narcotic agent status: Secondary | ICD-10-CM | POA: Insufficient documentation

## 2017-04-26 DIAGNOSIS — Z96612 Presence of left artificial shoulder joint: Secondary | ICD-10-CM | POA: Insufficient documentation

## 2017-04-26 DIAGNOSIS — Z7951 Long term (current) use of inhaled steroids: Secondary | ICD-10-CM | POA: Insufficient documentation

## 2017-04-26 DIAGNOSIS — Z79899 Other long term (current) drug therapy: Secondary | ICD-10-CM | POA: Diagnosis not present

## 2017-04-26 DIAGNOSIS — Z91040 Latex allergy status: Secondary | ICD-10-CM | POA: Diagnosis not present

## 2017-04-26 DIAGNOSIS — Z90710 Acquired absence of both cervix and uterus: Secondary | ICD-10-CM | POA: Insufficient documentation

## 2017-04-26 DIAGNOSIS — M1288 Other specific arthropathies, not elsewhere classified, other specified site: Secondary | ICD-10-CM | POA: Insufficient documentation

## 2017-04-26 DIAGNOSIS — Z882 Allergy status to sulfonamides status: Secondary | ICD-10-CM | POA: Diagnosis not present

## 2017-04-26 DIAGNOSIS — Z888 Allergy status to other drugs, medicaments and biological substances status: Secondary | ICD-10-CM | POA: Insufficient documentation

## 2017-04-26 DIAGNOSIS — M545 Low back pain: Secondary | ICD-10-CM | POA: Insufficient documentation

## 2017-04-26 MED ORDER — ROPIVACAINE HCL 2 MG/ML IJ SOLN
10.0000 mL | Freq: Once | INTRAMUSCULAR | Status: AC
  Administered 2017-04-26: 10 mL
  Filled 2017-04-26: qty 10

## 2017-04-26 MED ORDER — LACTATED RINGERS IV SOLN
1000.0000 mL | Freq: Once | INTRAVENOUS | Status: AC
  Administered 2017-04-26: 1000 mL via INTRAVENOUS

## 2017-04-26 MED ORDER — LIDOCAINE HCL (PF) 1 % IJ SOLN
10.0000 mL | Freq: Once | INTRAMUSCULAR | Status: AC
  Administered 2017-04-26: 5 mL
  Filled 2017-04-26: qty 10

## 2017-04-26 MED ORDER — FENTANYL CITRATE (PF) 100 MCG/2ML IJ SOLN
25.0000 ug | INTRAMUSCULAR | Status: AC | PRN
  Administered 2017-04-26 (×2): 100 ug via INTRAVENOUS
  Filled 2017-04-26: qty 2

## 2017-04-26 MED ORDER — ROPIVACAINE HCL 2 MG/ML IJ SOLN
INTRAMUSCULAR | Status: AC
  Filled 2017-04-26: qty 10

## 2017-04-26 NOTE — Progress Notes (Signed)
Safety precautions to be maintained throughout the outpatient stay will include: orient to surroundings, keep bed in low position, maintain call bell within reach at all times, provide assistance with transfer out of bed and ambulation.  

## 2017-04-26 NOTE — Patient Instructions (Signed)
Pain Management Discharge Instructions  General Discharge Instructions :  If you need to reach your doctor call: Monday-Friday 8:00 am - 4:00 pm at [phone] or toll free [phone].  After clinic hours [phone] to have operator reach doctor.  Bring all of your medication bottles to all your appointments in the pain clinic.  To cancel or reschedule your appointment with Pain Management please remember to call 24 hours in advance to avoid a fee.  Refer to the educational materials which you have been given on: General Risks, I had my Procedure. Discharge Instructions, Post Sedation.  Post Procedure Instructions:  The drugs you were given will stay in your system until tomorrow, so for the next 24 hours you should not drive, make any legal decisions or drink any alcoholic beverages.  You may eat anything you prefer, but it is better to start with liquids then soups and crackers, and gradually work up to solid foods.  Please notify your doctor immediately if you have any unusual bleeding, trouble breathing or pain that is not related to your normal pain.  Depending on the type of procedure that was done, some parts of your body may feel week and/or numb.  This usually clears up by tonight or the next day.  Walk with the use of an assistive device or accompanied by an adult for the 24 hours.  You may use ice on the affected area for the first 24 hours.  Put ice in a Ziploc bag and cover with a towel and place against area 15 minutes on 15 minutes off.  You may switch to heat after 24 hours.Facet Blocks Patient Information  Description: The facets are joints in the spine between the vertebrae.  Like any joints in the body, facets can become irritated and painful.  Arthritis can also effect the facets.  By injecting steroids and local anesthetic in and around these joints, we can temporarily block the nerve supply to them.  Steroids act directly on irritated nerves and tissues to  reduce selling and inflammation which often leads to decreased pain.  Facet blocks may be done anywhere along the spine from the neck to the low back depending upon the location of your pain.   After numbing the skin with local anesthetic (like Novocaine), a small needle is passed onto the facet joints under x-ray guidance.  You may experience a sensation of pressure while this is being done.  The entire block usually lasts about 15-25 minutes.   Conditions which may be treated by facet blocks:   Low back/buttock pain  Neck/shoulder pain  Certain types of headaches  Preparation for the injection:  1. Do not eat any solid food or dairy products within 8 hours of your appointment. 2. You may drink clear liquid up to 3 hours before appointment.  Clear liquids include water, black coffee, juice or soda.  No milk or cream please. 3. You may take your regular medication, including pain medications, with a sip of water before your appointment.  Diabetics should hold regular insulin (if taken separately) and take 1/2 normal NPH dose the morning of the procedure.  Carry some sugar containing items with you to your appointment. 4. A driver must accompany you and be prepared to drive you home after your procedure. 5. Bring all your current medications with you. 6. An IV may be inserted and sedation may be given at the discretion of the physician. 7. A blood pressure cuff, EKG and other monitors will often be   applied during the procedure.  Some patients may need to have extra oxygen administered for a short period. 8. You will be asked to provide medical information, including your allergies and medications, prior to the procedure.  We must know immediately if you are taking blood thinners (like Coumadin/Warfarin) or if you are allergic to IV iodine contrast (dye).  We must know if you could possible be pregnant.  Possible side-effects:   Bleeding from needle site  Infection (rare, may require  surgery)  Nerve injury (rare)  Numbness & tingling (temporary)  Difficulty urinating (rare, temporary)  Spinal headache (a headache worse with upright posture)  Light-headedness (temporary)  Pain at injection site (serveral days)  Decreased blood pressure (rare, temporary)  Weakness in arm/leg (temporary)  Pressure sensation in back/neck (temporary)   Call if you experience:   Fever/chills associated with headache or increased back/neck pain  Headache worsened by an upright position  New onset, weakness or numbness of an extremity below the injection site  Hives or difficulty breathing (go to the emergency room)  Inflammation or drainage at the injection site(s)  Severe back/neck pain greater than usual  New symptoms which are concerning to you  Please note:  Although the local anesthetic injected can often make your back or neck feel good for several hours after the injection, the pain will likely return. It takes 3-7 days for steroids to work.  You may not notice any pain relief for at least one week.  If effective, we will often do a series of 2-3 injections spaced 3-6 weeks apart to maximally decrease your pain.  After the initial series, you may be a candidate for a more permanent nerve block of the facets.  If you have any questions, please call [phone] [facility] 

## 2017-04-26 NOTE — Progress Notes (Signed)
Patient's Name: [name]  MRN: [id number]  Referring Provider: Lauro RegulusAnderson, Marshall W, MD  DOB: 07/29/1945  PCP: Lauro RegulusAnderson, Marshall W, MD  DOS: 04/26/2017  Note by: Edward JollyBilal Carrington Mullenax, MD  Service setting: Ambulatory outpatient  Specialty: Interventional Pain Management  Patient type: Established  Location: [facility] (AMB) Pain Management Facility  Visit type: Interventional Procedure   Primary Reason for Visit: Interventional Pain Management Treatment. CC: Back Pain (low)  Procedure:  Anesthesia, Analgesia, Anxiolysis:  Type: Diagnostic Medial Branch Facet Block Region: Lumbar Level: L3, L4, L5,Medial Branch Level(s) Laterality: Bilateral  Type: Local Anesthesia with Moderate (Conscious) Sedation Local Anesthetic: Lidocaine 1% Route: Intravenous (IV) IV Access: Secured Sedation: Meaningful verbal contact was maintained at all times during the procedure  Indication(s): Analgesia and Anxiety   Indications: 1. Lumbar spondylosis   2. Lumbar facet arthropathy   3. Lumbar degenerative disc disease    Pain Score: Pre-procedure: 10-Worst pain ever/10 Post-procedure: 0-No pain/10  Pre-op Assessment:  [name] is a 71 y.o. (year old), female patient, seen today for interventional treatment. She  has a past surgical history that includes Reduction mammaplasty (Bilateral); Ankle reconstruction (Left); Brachioplasty; Cataract extraction; Cholecystectomy; Soft Tissue Tumor Resection; Abdominal hysterectomy (07/12/2014); Joint replacement (Bilateral, 07/2014, 03/2015); nasal reconstrucion; Toe Surgery (Right); Shoulder open rotator cuff repair; and COLONOSCOPY WITH PROPOFOL (N/A, 12/07/2016). [name] has a current medication list which includes the following prescription(s): amantadine, amlodipine, vitamin c, aspirin ec, azelastine, b complex vitamins, baclofen, carvedilol, celecoxib, vitamin d3, donepezil, esomeprazole, folic acid, furosemide, levothyroxine,  losartan, melatonin, montelukast, multivitamin, onabotulinumtoxina, potassium chloride sa, tapentadol, torsemide, tramadol, trazodone, venlafaxine, bupropion hcl, and fluticasone, and the following Facility-Administered Medications: lactated ringers. Her primarily concern today is the Back Pain (low)  Initial Vital Signs: Blood pressure 126/75, pulse 76, temperature 98.4 F (36.9 C), resp. rate 18, height 5\' 3"  (1.6 m), weight 290 lb (131.5 kg), SpO2 96 %. BMI: Estimated body mass index is 51.37 kg/m as calculated from the following:   Height as of this encounter: 5\' 3"  (1.6 m).   Weight as of this encounter: 290 lb (131.5 kg).  Risk Assessment: Allergies: Reviewed. She is allergic to codeine; hydromorphone; latex; oxycodone-acetaminophen; sulfa antibiotics; bupropion; gabapentin; hydrocodone; morphine and related; and procaine.  Allergy Precautions: None required Coagulopathies: Reviewed. None identified.  Blood-thinner therapy: None at this time Active Infection(s): Reviewed. None identified. [name] is afebrile  Site Confirmation: [name] was asked to confirm the procedure and laterality before marking the site Procedure checklist: Completed Consent: Before the procedure and under the influence of no sedative(s), amnesic(s), or anxiolytics, the patient was informed of the treatment options, risks and possible complications. To fulfill our ethical and legal obligations, as recommended by the American Medical Association's Code of Ethics, I have informed the patient of my clinical impression; the nature and purpose of the treatment or procedure; the risks, benefits, and possible complications of the intervention; the alternatives, including doing nothing; the risk(s) and benefit(s) of the alternative treatment(s) or procedure(s); and the risk(s) and benefit(s) of doing nothing. The patient was provided information about the general risks and possible  complications associated with the procedure. These may include, but are not limited to: failure to achieve desired goals, infection, bleeding, organ or nerve damage, allergic reactions, paralysis, and death. In addition, the patient was informed of those risks and complications associated to Spine-related procedures, such as failure to decrease pain; infection (i.e.: Meningitis, epidural or intraspinal abscess); bleeding (i.e.: epidural hematoma, subarachnoid  hemorrhage, or any other type of intraspinal or peri-dural bleeding); organ or nerve damage (i.e.: Any type of peripheral nerve, nerve root, or spinal cord injury) with subsequent damage to sensory, motor, and/or autonomic systems, resulting in permanent pain, numbness, and/or weakness of one or several areas of the body; allergic reactions; (i.e.: anaphylactic reaction); and/or death. Furthermore, the patient was informed of those risks and complications associated with the medications. These include, but are not limited to: allergic reactions (i.e.: anaphylactic or anaphylactoid reaction(s)); adrenal axis suppression; blood sugar elevation that in diabetics may result in ketoacidosis or comma; water retention that in patients with history of congestive heart failure may result in shortness of breath, pulmonary edema, and decompensation with resultant heart failure; weight gain; swelling or edema; medication-induced neural toxicity; particulate matter embolism and blood vessel occlusion with resultant organ, and/or nervous system infarction; and/or aseptic necrosis of one or more joints. Finally, the patient was informed that Medicine is not an exact science; therefore, there is also the possibility of unforeseen or unpredictable risks and/or possible complications that may result in a catastrophic outcome. The patient indicated having understood very clearly. We have given the patient no guarantees and we have made no promises. Enough time was given to the  patient to ask questions, all of which were answered to the patient's satisfaction. [name] has indicated that she wanted to continue with the procedure. Attestation: I, the ordering provider, attest that I have discussed with the patient the benefits, risks, side-effects, alternatives, likelihood of achieving goals, and potential problems during recovery for the procedure that I have provided informed consent. Date: 04/26/2017; Time: 9:08 AM  Pre-Procedure Preparation:  Monitoring: As per clinic protocol. Respiration, ETCO2, SpO2, BP, heart rate and rhythm monitor placed and checked for adequate function Safety Precautions: Patient was assessed for positional comfort and pressure points before starting the procedure. Time-out: I initiated and conducted the "Time-out" before starting the procedure, as per protocol. The patient was asked to participate by confirming the accuracy of the "Time Out" information. Verification of the correct person, site, and procedure were performed and confirmed by me, the nursing staff, and the patient. "Time-out" conducted as per Joint Commission's Universal Protocol (UP.01.01.01). "Time-out" Date & Time: 04/26/2017; 0930 hrs.  Description of Procedure Process:   Position: Prone Target Area: For Lumbar Facet blocks, the target is the groove formed by the junction of the transverse process and superior articular process. For the L5 dorsal ramus, the target is the notch between superior articular process and sacral ala.  Approach: Paramedial approach. Area Prepped: Entire Posterior Lumbosacral Region Prepping solution: ChloraPrep (2% chlorhexidine gluconate and 70% isopropyl alcohol) Safety Precautions: Aspiration looking for blood return was conducted prior to all injections. At no point did we inject any substances, as a needle was being advanced. No attempts were made at seeking any paresthesias. Safe injection practices and needle disposal  techniques used. Medications properly checked for expiration dates. SDV (single dose vial) medications used. Description of the Procedure: Protocol guidelines were followed. The patient was placed in position over the fluoroscopy table. The target area was identified and the area prepped in the usual manner. Skin desensitized using vapocoolant spray. Skin & deeper tissues infiltrated with local anesthetic. Appropriate amount of time allowed to pass for local anesthetics to take effect. The procedure needle was introduced through the skin, ipsilateral to the reported pain, and advanced to the target area. Employing the "Medial Branch Technique", the needles were advanced to the angle made  by the superior and medial portion of the transverse process, and the lateral and inferior portion of the superior articulating process of the targeted vertebral bodies. This area is known as "Burton's Eye" or the "Eye of the [place] Dog". A procedure needle was introduced through the skin, and this time advanced to the angle made by the superior and medial border of the sacral ala, and the lateral border of the S1 vertebral body.  Negative aspiration confirmed. Solution injected in intermittent fashion, asking for systemic symptoms every 0.5cc of injectate. The needles were then removed and the area cleansed, making sure to leave some of the prepping solution back to take advantage of its long term bactericidal properties.   Illustration of the posterior view of the lumbar spine and the posterior neural structures. Laminae of L2 through S1 are labeled. DPRL5, dorsal primary ramus of L5; DPRS1, dorsal primary ramus of S1; DPR3, dorsal primary ramus of L3; FJ, facet (zygapophyseal) joint L3-L4; I, inferior articular process of L4; LB1, lateral branch of dorsal primary ramus of L1; IAB, inferior articular branches from L3 medial branch (supplies L4-L5 facet joint); IBP, intermediate branch plexus; MB3, medial branch of dorsal  primary ramus of L3; NR3, third lumbar nerve root; S, superior articular process of L5; SAB, superior articular branches from L4 (supplies L4-5 facet joint also); TP3, transverse process of L3.  Vitals:   04/26/17 0947 04/26/17 0957 04/26/17 1007 04/26/17 1017  BP: (!) 146/63 (!) 146/62 133/81 137/67  Pulse:   73 76  Resp:      Temp:   (!) 97.2 F (36.2 C)   SpO2: 99% 100% 100% 98%  Weight:      Height:        Start Time: 0931 hrs. End Time: 0947 hrs. Materials:  Needle(s) Type: Regular needle Gauge: 22G Length: 5-in Medication(s): We administered fentaNYL, lactated ringers, lidocaine (PF), and ropivacaine (PF) 2 mg/mL (0.2%). Please see chart orders for dosing details. 1.5 cc of 0.2% ropivacaine at each level. Imaging Guidance (Spinal):  Type of Imaging Technique: Fluoroscopy Guidance (Spinal) Indication(s): Assistance in needle guidance and placement for procedures requiring needle placement in or near specific anatomical locations not easily accessible without such assistance. Exposure Time: Please see nurses notes. Contrast: None used. Fluoroscopic Guidance: I was personally present during the use of fluoroscopy. "Tunnel Vision Technique" used to obtain the best possible view of the target area. Parallax error corrected before commencing the procedure. "Direction-depth-direction" technique used to introduce the needle under continuous pulsed fluoroscopy. Once target was reached, antero-posterior, oblique, and lateral fluoroscopic projection used confirm needle placement in all planes. Images permanently stored in EMR. Interpretation: No contrast injected. I personally interpreted the imaging intraoperatively. Adequate needle placement confirmed in multiple planes. Permanent images saved into the patient's record.  Antibiotic Prophylaxis:  Indication(s): None identified Antibiotic given: None  Post-operative Assessment:  EBL: None Complications: No immediate post-treatment  complications observed by team, or reported by patient. Note: The patient tolerated the entire procedure well. A repeat set of vitals were taken after the procedure and the patient was kept under observation following institutional policy, for this type of procedure. Post-procedural neurological assessment was performed, showing return to baseline, prior to discharge. The patient was provided with post-procedure discharge instructions, including a section on how to identify potential problems. Should any problems arise concerning this procedure, the patient was given instructions to immediately contact us, at any time, without hesitation. In any case, we plan to contact the patient by telephone  for a follow-up status report regarding this interventional procedure. Comments:  No additional relevant information. 5 out of 5 strength bilateral lower extremity: Plantar flexion, dorsiflexion, knee flexion, knee extension.  Plan of Care  Follow-up in 2 weeks for postprocedural evaluation  Imaging Orders     DG C-Arm 1-60 Min-No Report Procedure Orders    No procedure(s) ordered today    Medications ordered for procedure: Meds ordered this encounter  Medications  . fentaNYL (SUBLIMAZE) injection 25-100 mcg    Make sure Narcan is available in the pyxis when using this medication. In the event of respiratory depression (RR< 8/min): Titrate NARCAN (naloxone) in increments of 0.1 to 0.2 mg IV at 2-3 minute intervals, until desired degree of reversal.  . lactated ringers infusion 1,000 mL  . lidocaine (PF) (XYLOCAINE) 1 % injection 10 mL  . ropivacaine (PF) 2 mg/mL (0.2%) (NAROPIN) injection 10 mL   Medications administered: We administered fentaNYL, lactated ringers, lidocaine (PF), and ropivacaine (PF) 2 mg/mL (0.2%).  See the medical record for exact dosing, route, and time of administration. Disposition: Discharge home  Discharge Date & Time: 04/26/2017; 1020 hrs.   Physician-requested  Follow-up: Return in about 2 weeks (around 05/10/2017) for Post Procedure Evaluation. Future Appointments  Date Time Provider Department Center  05/11/2017 11:30 AM Edward JollyLateef, Ginna Schuur, MD [facility] None   Primary Care Physician: Lauro RegulusAnderson, Marshall W, MD [facility] Note by: Edward JollyBilal Kano Heckmann, MD Date: 04/26/2017; Time: 10:33 AM  Disclaimer:  Medicine is not an exact science. The only guarantee in medicine is that nothing is guaranteed. It is important to note that the decision to proceed with this intervention was based on the information collected from the patient. The Data and conclusions were drawn from the patient's questionnaire, the interview, and the physical examination. Because the information was provided in large part by the patient, it cannot be guaranteed that it has not been purposely or unconsciously manipulated. Every effort has been made to obtain as much relevant data as possible for this evaluation. It is important to note that the conclusions that lead to this procedure are derived in large part from the available data. Always take into account that the treatment will also be dependent on availability of resources and existing treatment guidelines, considered by other Pain Management Practitioners as being common knowledge and practice, at the time of the intervention. For Medico-Legal purposes, it is also important to point out that variation in procedural techniques and pharmacological choices are the acceptable norm. The indications, contraindications, technique, and results of the above procedure should only be interpreted and judged by a Board-Certified Interventional Pain Specialist with extensive familiarity and expertise in the same exact procedure and technique.

## 2017-05-11 ENCOUNTER — Other Ambulatory Visit: Payer: Self-pay

## 2017-05-11 ENCOUNTER — Encounter: Payer: Self-pay | Admitting: Student in an Organized Health Care Education/Training Program

## 2017-05-11 ENCOUNTER — Ambulatory Visit
Payer: Medicare Other | Attending: Student in an Organized Health Care Education/Training Program | Admitting: Student in an Organized Health Care Education/Training Program

## 2017-05-11 VITALS — BP 151/96 | HR 79 | Temp 98.2°F | Resp 16 | Ht 66.0 in | Wt 284.0 lb

## 2017-05-11 DIAGNOSIS — I10 Essential (primary) hypertension: Secondary | ICD-10-CM | POA: Insufficient documentation

## 2017-05-11 DIAGNOSIS — Z882 Allergy status to sulfonamides status: Secondary | ICD-10-CM | POA: Diagnosis not present

## 2017-05-11 DIAGNOSIS — K219 Gastro-esophageal reflux disease without esophagitis: Secondary | ICD-10-CM | POA: Insufficient documentation

## 2017-05-11 DIAGNOSIS — Z884 Allergy status to anesthetic agent status: Secondary | ICD-10-CM | POA: Diagnosis not present

## 2017-05-11 DIAGNOSIS — G4730 Sleep apnea, unspecified: Secondary | ICD-10-CM | POA: Insufficient documentation

## 2017-05-11 DIAGNOSIS — E039 Hypothyroidism, unspecified: Secondary | ICD-10-CM | POA: Diagnosis not present

## 2017-05-11 DIAGNOSIS — F329 Major depressive disorder, single episode, unspecified: Secondary | ICD-10-CM | POA: Insufficient documentation

## 2017-05-11 DIAGNOSIS — Z7982 Long term (current) use of aspirin: Secondary | ICD-10-CM | POA: Insufficient documentation

## 2017-05-11 DIAGNOSIS — Z90710 Acquired absence of both cervix and uterus: Secondary | ICD-10-CM | POA: Diagnosis not present

## 2017-05-11 DIAGNOSIS — M5136 Other intervertebral disc degeneration, lumbar region: Secondary | ICD-10-CM

## 2017-05-11 DIAGNOSIS — K589 Irritable bowel syndrome without diarrhea: Secondary | ICD-10-CM | POA: Insufficient documentation

## 2017-05-11 DIAGNOSIS — Z86010 Personal history of colonic polyps: Secondary | ICD-10-CM | POA: Insufficient documentation

## 2017-05-11 DIAGNOSIS — Z791 Long term (current) use of non-steroidal anti-inflammatories (NSAID): Secondary | ICD-10-CM | POA: Insufficient documentation

## 2017-05-11 DIAGNOSIS — N3281 Overactive bladder: Secondary | ICD-10-CM | POA: Insufficient documentation

## 2017-05-11 DIAGNOSIS — M1288 Other specific arthropathies, not elsewhere classified, other specified site: Secondary | ICD-10-CM | POA: Insufficient documentation

## 2017-05-11 DIAGNOSIS — G8929 Other chronic pain: Secondary | ICD-10-CM | POA: Diagnosis not present

## 2017-05-11 DIAGNOSIS — E876 Hypokalemia: Secondary | ICD-10-CM | POA: Diagnosis not present

## 2017-05-11 DIAGNOSIS — H8110 Benign paroxysmal vertigo, unspecified ear: Secondary | ICD-10-CM | POA: Insufficient documentation

## 2017-05-11 DIAGNOSIS — Z9049 Acquired absence of other specified parts of digestive tract: Secondary | ICD-10-CM | POA: Diagnosis not present

## 2017-05-11 DIAGNOSIS — Z888 Allergy status to other drugs, medicaments and biological substances status: Secondary | ICD-10-CM | POA: Diagnosis not present

## 2017-05-11 DIAGNOSIS — Z91040 Latex allergy status: Secondary | ICD-10-CM | POA: Insufficient documentation

## 2017-05-11 DIAGNOSIS — Z79890 Hormone replacement therapy (postmenopausal): Secondary | ICD-10-CM | POA: Diagnosis not present

## 2017-05-11 DIAGNOSIS — M545 Low back pain: Secondary | ICD-10-CM | POA: Diagnosis present

## 2017-05-11 DIAGNOSIS — Z885 Allergy status to narcotic agent status: Secondary | ICD-10-CM | POA: Insufficient documentation

## 2017-05-11 DIAGNOSIS — M47816 Spondylosis without myelopathy or radiculopathy, lumbar region: Secondary | ICD-10-CM | POA: Diagnosis not present

## 2017-05-11 DIAGNOSIS — E785 Hyperlipidemia, unspecified: Secondary | ICD-10-CM | POA: Diagnosis not present

## 2017-05-11 DIAGNOSIS — Z79899 Other long term (current) drug therapy: Secondary | ICD-10-CM | POA: Insufficient documentation

## 2017-05-11 NOTE — Progress Notes (Signed)
Safety precautions to be maintained throughout the outpatient stay will include: orient to surroundings, keep bed in low position, maintain call bell within reach at all times, provide assistance with transfer out of bed and ambulation.  

## 2017-05-11 NOTE — Progress Notes (Signed)
Patient's Name: [name]  MRN: [id number]  Referring Provider: Kirk Ruths, MD  DOB: 10-11-1945  PCP: Kirk Ruths, MD  DOS: 05/11/2017  Note by: Gillis Santa, MD  Service setting: Ambulatory outpatient  Specialty: Interventional Pain Management  Location: [facility] (AMB) Pain Management Facility    Patient type: Established   Primary Reason(s) for Visit: Encounter for post-procedure evaluation of chronic illness with mild to moderate exacerbation CC: Back Pain (bilateral lower)  HPI  [name] is a 71 y.o. year old, female patient, who comes today for a post-procedure evaluation. She has Lumbar spondylosis; Lumbar facet arthropathy; and Lumbar degenerative disc disease on their problem list. Her primarily concern today is the Back Pain (bilateral lower)  Pain Assessment: Location: Right, Left, Lower Back Radiating:   Onset: More than a month ago Duration: Chronic pain Quality: Aching, Sharp, Constant Severity: 7 /10 (self-reported pain score)  Note: Reported level is inconsistent with clinical observations. Clinically the patient looks like a 3/10 A 3/10 is viewed as "Moderate" and described as significantly interfering with activities of daily living (ADL). It becomes difficult to feed, bathe, get dressed, get on and off the toilet or to perform personal hygiene functions. Difficult to get in and out of bed or a chair without assistance. Very distracting. With effort, it can be ignored when deeply involved in activities.       When using our objective Pain Scale, levels between 6 and 10/10 are said to belong in an emergency room, as it progressively worsens from a 6/10, described as severely limiting, requiring emergency care not usually available at an outpatient pain management facility. At a 6/10 level, communication becomes difficult and requires great effort. Assistance to reach the emergency department may be required. Facial flushing and  profuse sweating along with potentially dangerous increases in heart rate and blood pressure will be evident. Effect on ADL:   Timing: Constant Modifying factors:    [name] comes in today for post-procedure evaluation after the treatment done on 04/26/2017.  Further details on both, my assessment(s), as well as the proposed treatment plan, please see below.  Post-Procedure Assessment  04/26/2017 Procedure: Bilateral L3, L4, L5 medial branch nerve block Pre-procedure pain score:  10/10 Post-procedure pain score: 0/10         Influential Factors: BMI: 45.84 kg/m Intra-procedural challenges: None observed.         Assessment challenges: None detected.              Reported side-effects: None.        Post-procedural adverse reactions or complications: None reported         Sedation: Please see nurses note. When no sedatives are used, the analgesic levels obtained are directly associated to the effectiveness of the local anesthetics. However, when sedation is provided, the level of analgesia obtained during the initial 1 hour following the intervention, is believed to be the result of a combination of factors. These factors may include, but are not limited to: 1. The effectiveness of the local anesthetics used. 2. The effects of the analgesic(s) and/or anxiolytic(s) used. 3. The degree of discomfort experienced by the patient at the time of the procedure. 4. The patients ability and reliability in recalling and recording the events. 5. The presence and influence of possible secondary gains and/or psychosocial factors. Reported result: Relief experienced during the 1st hour after the procedure: 100 % (Ultra-Short Term Relief)  Interpretative annotation: Clinically appropriate result. Analgesia during this period is likely to be Local Anesthetic and/or IV Sedative (Analgesic/Anxiolytic) related.          Effects of local anesthetic: The analgesic effects attained  during this period are directly associated to the localized infiltration of local anesthetics and therefore cary significant diagnostic value as to the etiological location, or anatomical origin, of the pain. Expected duration of relief is directly dependent on the pharmacodynamics of the local anesthetic used. Long-acting (4-6 hours) anesthetics used.  Reported result: Relief during the next 4 to 6 hour after the procedure: 90 % (Short-Term Relief)            Interpretative annotation: Clinically appropriate result. Analgesia during this period is likely to be Local Anesthetic-related.          Long-term benefit: Defined as the period of time past the expected duration of local anesthetics (1 hour for short-acting and 4-6 hours for long-acting). With the possible exception of prolonged sympathetic blockade from the local anesthetics, benefits during this period are typically attributed to, or associated with, other factors such as analgesic sensory neuropraxia, antiinflammatory effects, or beneficial biochemical changes provided by agents other than the local anesthetics.  Reported result: Extended relief following procedure: 50 % (Long-Term Relief)            Interpretative annotation: Clinically appropriate result. Good relief. No permanent benefit expected. Limited inflammation. Possible mechanical aggravating factors.          Current benefits: Defined as reported results that persistent at this point in time.   Analgesia: 50-75 %            Function: Somewhat improved ROM: Somewhat improved Interpretative annotation: Ongoing benefit. No permanent benefit expected. Effective diagnostic intervention.          Interpretation: Results would suggest a successful diagnostic intervention. We'll proceed with diagnostic intervention #2, as soon as convenient          Plan:  Please see "Plan of Care" for details.        Laboratory Chemistry  Inflammation Markers (CRP: Acute Phase) (ESR: Chronic  Phase) No results found for: CRP, ESRSEDRATE               Renal Function Markers No results found for: BUN, CREATININE, GFRAA, GFRNONAA               Hepatic Function Markers No results found for: AST, ALT, ALBUMIN, ALKPHOS, HCVAB               Electrolytes No results found for: NA, K, CL, CALCIUM, MG               Neuropathy Markers No results found for: [id number]               Bone Pathology Markers No results found for: Hendricks Milo, VD125OH2TOT, [id number], [id number], 25OHVITD1, 25OHVITD2, 25OHVITD3, CALCIUM, TESTOFREE, TESTOSTERONE               Rheumatology Markers No results found for: LABURIC, URICUR              Coagulation Parameters No results found for: INR, LABPROT, APTT, PLT, DDIMER               Cardiovascular Markers No results found for: BNP, CKTOTAL, CKMB, TROPONINI, HGB, HCT               CA Markers No results found for: CEA, CA125, LABCA2  Note: Lab results reviewed.  Recent Diagnostic Imaging Results  DG C-Arm 1-60 Min-No Report Fluoroscopy was utilized by the requesting physician.  No radiographic  interpretation.   Complexity Note: Imaging results reviewed. Results shared with [name], using Layman's terms.                         Meds   Current Outpatient Medications:  .  amantadine (SYMMETREL) 100 MG capsule, Take 100 mg by mouth 2 (two) times daily., Disp: , Rfl:  .  amLODipine (NORVASC) 5 MG tablet, Take 5 mg by mouth daily., Disp: , Rfl:  .  Ascorbic Acid (VITAMIN C) 1000 MG tablet, Take 1,000 mg daily by mouth., Disp: , Rfl:  .  aspirin EC 81 MG tablet, Take 81 mg by mouth daily., Disp: , Rfl:  .  azelastine (ASTELIN) 0.1 % nasal spray, 2 sprays as needed for rhinitis. Use in each nostril as directed, Disp: , Rfl:  .  B Complex Vitamins (VITAMIN-B COMPLEX PO), Take 100 mg daily by mouth., Disp: , Rfl:  .  baclofen (LIORESAL) 20 MG tablet, Take 20 mg by mouth daily as needed for muscle spasms., Disp: ,  Rfl:  .  carvedilol (COREG) 25 MG tablet, Take 25 mg 4 (four) times daily by mouth. , Disp: , Rfl:  .  celecoxib (CELEBREX) 200 MG capsule, Take 200 mg by mouth 2 (two) times daily., Disp: , Rfl:  .  Cholecalciferol (VITAMIN D3) 2000 units TABS, Take 2,000 Units daily by mouth., Disp: , Rfl:  .  donepezil (ARICEPT) 10 MG tablet, Take 10 mg by mouth at bedtime., Disp: , Rfl:  .  esomeprazole (NEXIUM) 40 MG capsule, Take 40 mg by mouth daily., Disp: , Rfl:  .  folic acid (FOLVITE) 400 MCG tablet, Take 400 mcg by mouth 2 (two) times daily., Disp: , Rfl:  .  levothyroxine (SYNTHROID, LEVOTHROID) 112 MCG tablet, Take 112 mcg by mouth daily., Disp: , Rfl:  .  losartan (COZAAR) 100 MG tablet, Take 100 mg by mouth daily., Disp: , Rfl:  .  Melatonin 3 MG TABS, Take 1 tablet at bedtime by mouth. , Disp: , Rfl:  .  montelukast (SINGULAIR) 10 MG tablet, Take 10 mg by mouth every evening., Disp: , Rfl:  .  Multiple Vitamin (MULTIVITAMIN) tablet, Take 1 tablet by mouth daily., Disp: , Rfl:  .  OnabotulinumtoxinA (BOTOX IJ), Inject every 4 (four) months as directed., Disp: , Rfl:  .  potassium chloride SA (K-DUR,KLOR-CON) 20 MEQ tablet, Take 20 mEq by mouth 3 (three) times daily., Disp: , Rfl:  .  tapentadol (NUCYNTA) 50 MG tablet, Take 50 mg by mouth as needed. , Disp: , Rfl:  .  torsemide (DEMADEX) 10 MG tablet, Take 10 mg daily by mouth., Disp: , Rfl:  .  traMADol (ULTRAM) 50 MG tablet, Take 50 mg by mouth every 12 (twelve) hours as needed. , Disp: , Rfl:  .  traZODone (DESYREL) 50 MG tablet, Take by mouth at bedtime. 50-118m nightly as needed, Disp: , Rfl:  .  venlafaxine (EFFEXOR) 75 MG tablet, Take 75 mg by mouth 3 (three) times daily., Disp: , Rfl:   ROS  Constitutional: Denies any fever or chills Gastrointestinal: No reported hemesis, hematochezia, vomiting, or acute GI distress Musculoskeletal: Denies any acute onset joint swelling, redness, loss of ROM, or weakness Neurological: No reported  episodes of acute onset apraxia, aphasia, dysarthria, agnosia, amnesia, paralysis, loss of coordination,  or loss of consciousness  Allergies  [name] is allergic to codeine; hydromorphone; latex; oxycodone-acetaminophen; sulfa antibiotics; bupropion; gabapentin; hydrocodone; morphine and related; and procaine.  [place]  Drug: [name]  reports that she does not use drugs. Alcohol:  reports that she drinks about 1.2 oz of alcohol per week. Tobacco:  reports that  has never smoked. she has never used smokeless tobacco. 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 Surgical: [name]  has a past surgical history that includes Reduction mammaplasty (Bilateral); Ankle reconstruction (Left); Brachioplasty; Cataract extraction; Cholecystectomy; Soft Tissue Tumor Resection; Abdominal hysterectomy (07/12/2014); Joint replacement (Bilateral, 07/2014, 03/2015); nasal reconstrucion; Toe Surgery (Right); Shoulder open rotator cuff repair; and Colonoscopy with propofol (N/A, 12/07/2016). Family: family history includes Breast cancer in her other.  Constitutional Exam  General appearance: Well nourished, well developed, and well hydrated. In no apparent acute distress Vitals:   05/11/17 1150  BP: (!) 151/96  Pulse: 79  Resp: 16  Temp: 98.2 F (36.8 C)  TempSrc: Oral  SpO2: 98%  Weight: 284 lb (128.8 kg)  Height: 5' 6"  (1.676 m)   BMI Assessment: Estimated body mass index is 45.84 kg/m as calculated from the following:   Height as of this encounter: 5' 6"  (1.676 m).    Weight as of this encounter: 284 lb (128.8 kg).  BMI interpretation table: BMI level Category Range association with higher incidence of chronic pain  <18 kg/m2 Underweight   18.5-24.9 kg/m2 Ideal body weight   25-29.9 kg/m2 Overweight Increased incidence by 20%  30-34.9 kg/m2 Obese (Class I) Increased incidence by 68%  35-39.9 kg/m2 Severe obesity (Class II) Increased incidence by 136%  >40 kg/m2 Extreme obesity (Class III) Increased incidence by 254%   BMI Readings from Last 4 Encounters:  05/11/17 45.84 kg/m  04/26/17 51.37 kg/m  04/20/17 52.08 kg/m  12/07/16 43.58 kg/m   Wt Readings from Last 4 Encounters:  05/11/17 284 lb (128.8 kg)  04/26/17 290 lb (131.5 kg)  04/20/17 294 lb (133.4 kg)  12/07/16 270 lb (122.5 kg)  Psych/Mental status: Alert, oriented x 3 (person, place, & time)       Eyes: PERLA Respiratory: No evidence of acute respiratory distress  Cervical Spine Area Exam  Skin & Axial Inspection: No masses, redness, edema, swelling, or associated skin lesions Alignment: Symmetrical Functional ROM: Unrestricted ROM      Stability: No instability detected Muscle Tone/Strength: Functionally intact. No obvious neuro-muscular anomalies detected. Sensory (Neurological): Unimpaired Palpation: No palpable anomalies              Upper Extremity (UE) Exam    Side: Right upper extremity  Side: Left upper extremity  Skin & Extremity Inspection: Skin color, temperature, and hair growth are WNL. No peripheral edema or cyanosis. No masses, redness, swelling, asymmetry, or associated skin lesions. No contractures.  Skin & Extremity Inspection: Skin color, temperature, and hair growth are WNL. No peripheral edema or cyanosis. No masses, redness, swelling, asymmetry, or associated skin lesions. No contractures.  Functional ROM: Unrestricted ROM          Functional ROM: Unrestricted ROM          Muscle Tone/Strength: Functionally intact. No obvious neuro-muscular anomalies  detected.  Muscle Tone/Strength: Functionally intact. No obvious neuro-muscular anomalies detected.  Sensory (Neurological): Unimpaired          Sensory (Neurological): Unimpaired          Palpation: No palpable anomalies              Palpation: No palpable anomalies              Specialized Test(s): Deferred         Specialized Test(s): Deferred          Thoracic Spine Area Exam  Skin & Axial Inspection: No masses, redness, or swelling Alignment: Symmetrical Functional ROM: Unrestricted ROM Stability: No instability detected Muscle Tone/Strength: Functionally intact. No obvious neuro-muscular anomalies detected. Sensory (Neurological): Unimpaired Muscle strength & Tone: No palpable anomalies  Lumbar Spine Area Exam  Skin & Axial Inspection: No masses, redness, or swelling Alignment: Symmetrical Functional ROM: Unrestricted ROM      Stability: No instability detected Muscle Tone/Strength: Functionally intact. No obvious neuro-muscular anomalies detected. Sensory (Neurological): Unimpaired Palpation: No palpable anomalies       Provocative Tests: Lumbar Hyperextension and rotation test: Improved after treatment       Lumbar Lateral bending test: Improved after treatment       Patrick's Maneuver: evaluation deferred today                    Gait & Posture Assessment  Ambulation: Unassisted Gait: Relatively normal for age and body habitus Posture: WNL   Lower Extremity Exam    Side: Right lower extremity  Side: Left lower extremity  Skin & Extremity Inspection: Skin color, temperature, and hair growth are WNL. No peripheral edema or cyanosis. No masses, redness, swelling, asymmetry, or associated skin lesions. No contractures.  Skin & Extremity Inspection: Skin color, temperature, and hair growth are WNL. No peripheral edema or cyanosis. No masses, redness, swelling, asymmetry, or associated skin lesions. No contractures.  Functional ROM: Unrestricted ROM          Functional ROM:  Unrestricted ROM          Muscle Tone/Strength: Functionally intact. No obvious neuro-muscular anomalies detected.  Muscle Tone/Strength: Functionally intact. No obvious neuro-muscular anomalies detected.  Sensory (Neurological): Unimpaired  Sensory (Neurological): Unimpaired  Palpation: No palpable anomalies  Palpation: No palpable anomalies   Assessment  Primary Diagnosis & Pertinent Problem List: The primary encounter diagnosis was Lumbar spondylosis. Diagnoses of Lumbar facet arthropathy and Lumbar degenerative disc disease were also pertinent to this visit.  Status Diagnosis  Responding Responding Responding 1. Lumbar spondylosis   2. Lumbar facet arthropathy   3. Lumbar degenerative disc disease      71 year old female who presents with axial low back pain, bilateral that is chronic in nature but has acutely worsened over the last 3 months.  Patient has tried 3 epidural steroid injections in the past without significant benefit.  She has completed physical therapy which was effective for her leg pain but not her back pain.  Patient's lumbar MRI shows facet arthropathy and facet arthrosis most pronounced at L4-L5 and L5-S1.  Patient is status post bilateral lumbar medial branch nerve blocks at L3-L5 who presents for follow-up.  Patient notes significant improvement in axial low back pain symptoms along with improve range of motion and greater ease of ambulation for the first week and a half post procedure.  Patient has improved lumbar extension with less pain since the procedure.  This would suggest a positive diagnostic block.  We will  proceed with diagnostic block #2 followed by radiofrequency ablation.  Plan: -Schedule for bilateral diagnostic lumbar facet medial branch nerve blocks at  at L3/4, L4/5, L5/S1,  #2 followed by radio frequency ablation  Future: -Bilateral sacroiliac joint injection -lumbar paraspinal trigger point injection -Bilateral lumbar erector spinae  block  Lab-work, procedure(s), and/or referral(s): Orders Placed This Encounter  Procedures  . LUMBAR FACET(MEDIAL BRANCH NERVE BLOCK) [facility]    Provider-requested follow-up: Return in about 1 week (around 05/18/2017) for Procedure.  Future Appointments  Date Time Provider [place]  05/19/2017  8:45 AM Gillis Santa, MD [facility] None    Primary Care Physician: Kirk Ruths, MD Location: [facility] Note by: Gillis Santa, M.D Date: 05/11/2017; Time: 1:53 PM  Patient Instructions   Return for repeat bilateral facet blocks without sedation  Facet Blocks Patient Information  Description: The facets are joints in the spine between the vertebrae.  Like any joints in the body, facets can become irritated and painful.  Arthritis can also effect the facets.  By injecting steroids and local anesthetic in and around these joints, we can temporarily block the nerve supply to them.  Steroids act directly on irritated nerves and tissues to reduce selling and inflammation which often leads to decreased pain.  Facet blocks may be done anywhere along the spine from the neck to the low back depending upon the location of your pain.   After numbing the skin with local anesthetic (like Novocaine), a small needle is passed onto the facet joints under x-ray guidance.  You may experience a sensation of pressure while this is being done.  The entire block usually lasts about 15-25 minutes.   Conditions which may be treated by facet blocks:   Low back/buttock pain  Neck/shoulder pain  Certain types of headaches  Preparation for the injection:  1. Do not eat any solid food or dairy products within 8 hours of your appointment. 2. You may drink clear liquid up to 3 hours before appointment.  Clear liquids include water, black coffee, juice or soda.  No milk or cream please. 3. You may take your regular medication, including pain medications, with a sip of  water before your appointment.  Diabetics should hold regular insulin (if taken separately) and take 1/2 normal NPH dose the morning of the procedure.  Carry some sugar containing items with you to your appointment. 4. A driver must accompany you and be prepared to drive you home after your procedure. 5. Bring all your current medications with you. 6. An IV may be inserted and sedation may be given at the discretion of the physician. 7. A blood pressure cuff, EKG and other monitors will often be applied during the procedure.  Some patients may need to have extra oxygen administered for a short period. 8. You will be asked to provide medical information, including your allergies and medications, prior to the procedure.  We must know immediately if you are taking blood thinners (like Coumadin/Warfarin) or if you are allergic to IV iodine contrast (dye).  We must know if you could possible be pregnant.  Possible side-effects:   Bleeding from needle site  Infection (rare, may require surgery)  Nerve injury (rare)  Numbness & tingling (temporary)  Difficulty urinating (rare, temporary)  Spinal headache (a headache worse with upright posture)  Light-headedness (temporary)  Pain at injection site (serveral days)  Decreased blood pressure (rare, temporary)  Weakness in arm/leg (temporary)  Pressure sensation in back/neck (temporary)  Call if you experience:   Fever/chills associated with headache or increased back/neck pain  Headache worsened by an upright position  New onset, weakness or numbness of an extremity below the injection site  Hives or difficulty breathing (go to the emergency room)  Inflammation or drainage at the injection site(s)  Severe back/neck pain greater than usual  New symptoms which are concerning to you  Please note:  Although the local anesthetic injected can often make your back or neck feel good for several hours after the injection, the pain  will likely return. It takes 3-7 days for steroids to work.  You may not notice any pain relief for at least one week.  If effective, we will often do a series of 2-3 injections spaced 3-6 weeks apart to maximally decrease your pain.  After the initial series, you may be a candidate for a more permanent nerve block of the facets.  GENERAL RISKS AND COMPLICATIONS  What are the risk, side effects and possible complications? Generally speaking, most procedures are safe.  However, with any procedure there are risks, side effects, and the possibility of complications.  The risks and complications are dependent upon the sites that are lesioned, or the type of nerve block to be performed.  The closer the procedure is to the spine, the more serious the risks are.  Great care is taken when placing the radio frequency needles, block needles or lesioning probes, but sometimes complications can occur. 1. Infection: Any time there is an injection through the skin, there is a risk of infection.  This is why sterile conditions are used for these blocks.  There are four possible types of infection. 1. Localized skin infection. 2. Central Nervous System Infection-This can be in the form of Meningitis, which can be deadly. 3. Epidural Infections-This can be in the form of an epidural abscess, which can cause pressure inside of the spine, causing compression of the spinal cord with subsequent paralysis. This would require an emergency surgery to decompress, and there are no guarantees that the patient would recover from the paralysis. 4. Discitis-This is an infection of the intervertebral discs.  It occurs in about 1% of discography procedures.  It is difficult to treat and it may lead to surgery.        2. Pain: the needles have to go through skin and soft tissues, will cause soreness.       3. Damage to internal structures:  The nerves to be lesioned may be near blood vessels or    other nerves which can be  potentially damaged.       4. Bleeding: Bleeding is more common if the patient is taking blood thinners such as  aspirin, Coumadin, Ticiid, Plavix, etc., or if he/she have some genetic predisposition  such as hemophilia. Bleeding into the spinal canal can cause compression of the spinal  cord with subsequent paralysis.  This would require an emergency surgery to  decompress and there are no guarantees that the patient would recover from the  paralysis.       5. Pneumothorax:  Puncturing of a lung is a possibility, every time a needle is introduced in  the area of the chest or upper back.  Pneumothorax refers to free air around the  collapsed lung(s), inside of the thoracic cavity (chest cavity).  Another two possible  complications related to a similar event would include: Hemothorax and Chylothorax.   These are variations of the Pneumothorax, where instead of air  around the collapsed  lung(s), you may have blood or chyle, respectively.       6. Spinal headaches: They may occur with any procedures in the area of the spine.       7. Persistent CSF (Cerebro-Spinal Fluid) leakage: This is a rare problem, but may occur  with prolonged intrathecal or epidural catheters either due to the formation of a fistulous  track or a dural tear.       8. Nerve damage: By working so close to the spinal cord, there is always a possibility of  nerve damage, which could be as serious as a permanent spinal cord injury with  paralysis.       9. Death:  Although rare, severe deadly allergic reactions known as "Anaphylactic  reaction" can occur to any of the medications used.      10. Worsening of the symptoms:  We can always make thing worse.  What are the chances of something like this happening? Chances of any of this occuring are extremely low.  By statistics, you have more of a chance of getting killed in a motor vehicle accident: while driving to the hospital than any of the above occurring .  Nevertheless, you should be  aware that they are possibilities.  In general, it is similar to taking a shower.  Everybody knows that you can slip, hit your head and get killed.  Does that mean that you should not shower again?  Nevertheless always keep in mind that statistics do not mean anything if you happen to be on the wrong side of them.  Even if a procedure has a 1 (one) in a 1,000,000 (million) chance of going wrong, it you happen to be that one..Also, keep in mind that by statistics, you have more of a chance of having something go wrong when taking medications.  Who should not have this procedure? If you are on a blood thinning medication (e.g. Coumadin, Plavix, see list of "Blood Thinners"), or if you have an active infection going on, you should not have the procedure.  If you are taking any blood thinners, please inform your physician.  How should I prepare for this procedure?  Do not eat or drink anything at least six hours prior to the procedure.  Bring a driver with you .  It cannot be a taxi.  Come accompanied by an adult that can drive you back, and that is strong enough to help you if your legs get weak or numb from the local anesthetic.  Take all of your medicines the morning of the procedure with just enough water to swallow them.  If you have diabetes, make sure that you are scheduled to have your procedure done first thing in the morning, whenever possible.  If you have diabetes, take only half of your insulin dose and notify our nurse that you have done so as soon as you arrive at the clinic.  If you are diabetic, but only take blood sugar pills (oral hypoglycemic), then do not take them on the morning of your procedure.  You may take them after you have had the procedure.  Do not take aspirin or any aspirin-containing medications, at least eleven (11) days prior to the procedure.  They may prolong bleeding.  Wear loose fitting clothing that may be easy to take off and that you would not mind if it  got stained with Betadine or blood.  Do not wear any jewelry or perfume  Remove any  nail coloring.  It will interfere with some of our monitoring equipment.  NOTE: Remember that this is not meant to be interpreted as a complete list of all possible complications.  Unforeseen problems may occur.  BLOOD THINNERS The following drugs contain aspirin or other products, which can cause increased bleeding during surgery and should not be taken for 2 weeks prior to and 1 week after surgery.  If you should need take something for relief of minor pain, you may take acetaminophen which is found in Tylenol,m Datril, Anacin-3 and Panadol. It is not blood thinner. The products listed below are.  Do not take any of the products listed below in addition to any listed on your instruction sheet.  A.P.C or A.P.C with Codeine Codeine Phosphate Capsules #3 Ibuprofen Ridaura  ABC compound Congesprin Imuran rimadil  Advil Cope Indocin Robaxisal  Alka-Seltzer Effervescent Pain Reliever and Antacid Coricidin or Coricidin-D  Indomethacin Rufen  Alka-Seltzer plus Cold Medicine Cosprin Ketoprofen S-A-C Tablets  Anacin Analgesic Tablets or Capsules Coumadin Korlgesic Salflex  Anacin Extra Strength Analgesic tablets or capsules CP-2 Tablets Lanoril Salicylate  Anaprox Cuprimine Capsules Levenox Salocol  Anexsia-D Dalteparin Magan Salsalate  Anodynos Darvon compound Magnesium Salicylate Sine-off  Ansaid Dasin Capsules Magsal Sodium Salicylate  Anturane Depen Capsules Marnal Soma  APF Arthritis pain formula Dewitt's Pills Measurin Stanback  Argesic Dia-Gesic Meclofenamic Sulfinpyrazone  Arthritis Bayer Timed Release Aspirin Diclofenac Meclomen Sulindac  Arthritis pain formula Anacin Dicumarol Medipren Supac  Analgesic (Safety coated) Arthralgen Diffunasal Mefanamic Suprofen  Arthritis Strength Bufferin Dihydrocodeine Mepro Compound Suprol  Arthropan liquid Dopirydamole Methcarbomol with Aspirin Synalgos  ASA  tablets/Enseals Disalcid Micrainin Tagament  Ascriptin Doan's Midol Talwin  Ascriptin A/D Dolene Mobidin Tanderil  Ascriptin Extra Strength Dolobid Moblgesic Ticlid  Ascriptin with Codeine Doloprin or Doloprin with Codeine Momentum Tolectin  Asperbuf Duoprin Mono-gesic Trendar  Aspergum Duradyne Motrin or Motrin IB Triminicin  Aspirin plain, buffered or enteric coated Durasal Myochrisine Trigesic  Aspirin Suppositories Easprin Nalfon Trillsate  Aspirin with Codeine Ecotrin Regular or Extra Strength Naprosyn Uracel  Atromid-S Efficin Naproxen Ursinus  Auranofin Capsules Elmiron Neocylate Vanquish  Axotal Emagrin Norgesic Verin  Azathioprine Empirin or Empirin with Codeine Normiflo Vitamin E  Azolid Emprazil Nuprin Voltaren  Bayer Aspirin plain, buffered or children's or timed BC Tablets or powders Encaprin Orgaran Warfarin Sodium  Buff-a-Comp Enoxaparin Orudis Zorpin  Buff-a-Comp with Codeine Equegesic Os-Cal-Gesic   Buffaprin Excedrin plain, buffered or Extra Strength Oxalid   Bufferin Arthritis Strength Feldene Oxphenbutazone   Bufferin plain or Extra Strength Feldene Capsules Oxycodone with Aspirin   Bufferin with Codeine Fenoprofen Fenoprofen Pabalate or Pabalate-SF   Buffets II Flogesic Panagesic   Buffinol plain or Extra Strength Florinal or Florinal with Codeine Panwarfarin   Buf-Tabs Flurbiprofen Penicillamine   Butalbital Compound Four-way cold tablets Penicillin   Butazolidin Fragmin Pepto-Bismol   Carbenicillin Geminisyn Percodan   Carna Arthritis Reliever Geopen Persantine   Carprofen Gold's salt Persistin   Chloramphenicol Goody's Phenylbutazone   Chloromycetin Haltrain Piroxlcam   Clmetidine heparin Plaquenil   Cllnoril Hyco-pap Ponstel   Clofibrate Hydroxy chloroquine Propoxyphen         Before stopping any of these medications, be sure to consult the physician who ordered them.  Some, such as Coumadin (Warfarin) are ordered to prevent or treat serious conditions  such as "deep thrombosis", "pumonary embolisms", and other heart problems.  The amount of time that you may need off of the medication may also vary with the medication and the reason  for which you were taking it.  If you are taking any of these medications, please make sure you notify your pain physician before you undergo any procedures.           If you have any questions, please call [phone] [facility]

## 2017-05-11 NOTE — Patient Instructions (Addendum)
Return for repeat bilateral facet blocks without sedation  Facet Blocks Patient Information  Description: The facets are joints in the spine between the vertebrae.  Like any joints in the body, facets can become irritated and painful.  Arthritis can also effect the facets.  By injecting steroids and local anesthetic in and around these joints, we can temporarily block the nerve supply to them.  Steroids act directly on irritated nerves and tissues to reduce selling and inflammation which often leads to decreased pain.  Facet blocks may be done anywhere along the spine from the neck to the low back depending upon the location of your pain.   After numbing the skin with local anesthetic (like Novocaine), a small needle is passed onto the facet joints under x-ray guidance.  You may experience a sensation of pressure while this is being done.  The entire block usually lasts about 15-25 minutes.   Conditions which may be treated by facet blocks:   Low back/buttock pain  Neck/shoulder pain  Certain types of headaches  Preparation for the injection:  1. Do not eat any solid food or dairy products within 8 hours of your appointment. 2. You may drink clear liquid up to 3 hours before appointment.  Clear liquids include water, black coffee, juice or soda.  No milk or cream please. 3. You may take your regular medication, including pain medications, with a sip of water before your appointment.  Diabetics should hold regular insulin (if taken separately) and take 1/2 normal NPH dose the morning of the procedure.  Carry some sugar containing items with you to your appointment. 4. A driver must accompany you and be prepared to drive you home after your procedure. 5. Bring all your current medications with you. 6. An IV may be inserted and sedation may be given at the discretion of the physician. 7. A blood pressure cuff, EKG and other monitors will often be applied during the procedure.  Some patients may  need to have extra oxygen administered for a short period. 8. You will be asked to provide medical information, including your allergies and medications, prior to the procedure.  We must know immediately if you are taking blood thinners (like Coumadin/Warfarin) or if you are allergic to IV iodine contrast (dye).  We must know if you could possible be pregnant.  Possible side-effects:   Bleeding from needle site  Infection (rare, may require surgery)  Nerve injury (rare)  Numbness & tingling (temporary)  Difficulty urinating (rare, temporary)  Spinal headache (a headache worse with upright posture)  Light-headedness (temporary)  Pain at injection site (serveral days)  Decreased blood pressure (rare, temporary)  Weakness in arm/leg (temporary)  Pressure sensation in back/neck (temporary)   Call if you experience:   Fever/chills associated with headache or increased back/neck pain  Headache worsened by an upright position  New onset, weakness or numbness of an extremity below the injection site  Hives or difficulty breathing (go to the emergency room)  Inflammation or drainage at the injection site(s)  Severe back/neck pain greater than usual  New symptoms which are concerning to you  Please note:  Although the local anesthetic injected can often make your back or neck feel good for several hours after the injection, the pain will likely return. It takes 3-7 days for steroids to work.  You may not notice any pain relief for at least one week.  If effective, we will often do a series of 2-3 injections spaced 3-6 weeks apart  to maximally decrease your pain.  After the initial series, you may be a candidate for a more permanent nerve block of the facets.  GENERAL RISKS AND COMPLICATIONS  What are the risk, side effects and possible complications? Generally speaking, most procedures are safe.  However, with any procedure there are risks, side effects, and the  possibility of complications.  The risks and complications are dependent upon the sites that are lesioned, or the type of nerve block to be performed.  The closer the procedure is to the spine, the more serious the risks are.  Great care is taken when placing the radio frequency needles, block needles or lesioning probes, but sometimes complications can occur. 1. Infection: Any time there is an injection through the skin, there is a risk of infection.  This is why sterile conditions are used for these blocks.  There are four possible types of infection. 1. Localized skin infection. 2. Central Nervous System Infection-This can be in the form of Meningitis, which can be deadly. 3. Epidural Infections-This can be in the form of an epidural abscess, which can cause pressure inside of the spine, causing compression of the spinal cord with subsequent paralysis. This would require an emergency surgery to decompress, and there are no guarantees that the patient would recover from the paralysis. 4. Discitis-This is an infection of the intervertebral discs.  It occurs in about 1% of discography procedures.  It is difficult to treat and it may lead to surgery.        2. Pain: the needles have to go through skin and soft tissues, will cause soreness.       3. Damage to internal structures:  The nerves to be lesioned may be near blood vessels or    other nerves which can be potentially damaged.       4. Bleeding: Bleeding is more common if the patient is taking blood thinners such as  aspirin, Coumadin, Ticiid, Plavix, etc., or if he/she have some genetic predisposition  such as hemophilia. Bleeding into the spinal canal can cause compression of the spinal  cord with subsequent paralysis.  This would require an emergency surgery to  decompress and there are no guarantees that the patient would recover from the  paralysis.       5. Pneumothorax:  Puncturing of a lung is a possibility, every time a needle is introduced  in  the area of the chest or upper back.  Pneumothorax refers to free air around the  collapsed lung(s), inside of the thoracic cavity (chest cavity).  Another two possible  complications related to a similar event would include: Hemothorax and Chylothorax.   These are variations of the Pneumothorax, where instead of air around the collapsed  lung(s), you may have blood or chyle, respectively.       6. Spinal headaches: They may occur with any procedures in the area of the spine.       7. Persistent CSF (Cerebro-Spinal Fluid) leakage: This is a rare problem, but may occur  with prolonged intrathecal or epidural catheters either due to the formation of a fistulous  track or a dural tear.       8. Nerve damage: By working so close to the spinal cord, there is always a possibility of  nerve damage, which could be as serious as a permanent spinal cord injury with  paralysis.       9. Death:  Although rare, severe deadly allergic reactions known as "Anaphylactic  reaction" can occur to any of the medications used.      10. Worsening of the symptoms:  We can always make thing worse.  What are the chances of something like this happening? Chances of any of this occuring are extremely low.  By statistics, you have more of a chance of getting killed in a motor vehicle accident: while driving to the hospital than any of the above occurring .  Nevertheless, you should be aware that they are possibilities.  In general, it is similar to taking a shower.  Everybody knows that you can slip, hit your head and get killed.  Does that mean that you should not shower again?  Nevertheless always keep in mind that statistics do not mean anything if you happen to be on the wrong side of them.  Even if a procedure has a 1 (one) in a 1,000,000 (million) chance of going wrong, it you happen to be that one..Also, keep in mind that by statistics, you have more of a chance of having something go wrong when taking medications.  Who  should not have this procedure? If you are on a blood thinning medication (e.g. Coumadin, Plavix, see list of "Blood Thinners"), or if you have an active infection going on, you should not have the procedure.  If you are taking any blood thinners, please inform your physician.  How should I prepare for this procedure?  Do not eat or drink anything at least six hours prior to the procedure.  Bring a driver with you .  It cannot be a taxi.  Come accompanied by an adult that can drive you back, and that is strong enough to help you if your legs get weak or numb from the local anesthetic.  Take all of your medicines the morning of the procedure with just enough water to swallow them.  If you have diabetes, make sure that you are scheduled to have your procedure done first thing in the morning, whenever possible.  If you have diabetes, take only half of your insulin dose and notify our nurse that you have done so as soon as you arrive at the clinic.  If you are diabetic, but only take blood sugar pills (oral hypoglycemic), then do not take them on the morning of your procedure.  You may take them after you have had the procedure.  Do not take aspirin or any aspirin-containing medications, at least eleven (11) days prior to the procedure.  They may prolong bleeding.  Wear loose fitting clothing that may be easy to take off and that you would not mind if it got stained with Betadine or blood.  Do not wear any jewelry or perfume  Remove any nail coloring.  It will interfere with some of our monitoring equipment.  NOTE: Remember that this is not meant to be interpreted as a complete list of all possible complications.  Unforeseen problems may occur.  BLOOD THINNERS The following drugs contain aspirin or other products, which can cause increased bleeding during surgery and should not be taken for 2 weeks prior to and 1 week after surgery.  If you should need take something for relief of minor  pain, you may take acetaminophen which is found in Tylenol,m Datril, Anacin-3 and Panadol. It is not blood thinner. The products listed below are.  Do not take any of the products listed below in addition to any listed on your instruction sheet.  A.P.C or A.P.C with Codeine Codeine Phosphate Capsules #3  Ibuprofen Ridaura  ABC compound Congesprin Imuran rimadil  Advil Cope Indocin Robaxisal  Alka-Seltzer Effervescent Pain Reliever and Antacid Coricidin or Coricidin-D  Indomethacin Rufen  Alka-Seltzer plus Cold Medicine Cosprin Ketoprofen S-A-C Tablets  Anacin Analgesic Tablets or Capsules Coumadin Korlgesic Salflex  Anacin Extra Strength Analgesic tablets or capsules CP-2 Tablets Lanoril Salicylate  Anaprox Cuprimine Capsules Levenox Salocol  Anexsia-D Dalteparin Magan Salsalate  Anodynos Darvon compound Magnesium Salicylate Sine-off  Ansaid Dasin Capsules Magsal Sodium Salicylate  Anturane Depen Capsules Marnal Soma  APF Arthritis pain formula Dewitt's Pills Measurin Stanback  Argesic Dia-Gesic Meclofenamic Sulfinpyrazone  Arthritis Bayer Timed Release Aspirin Diclofenac Meclomen Sulindac  Arthritis pain formula Anacin Dicumarol Medipren Supac  Analgesic (Safety coated) Arthralgen Diffunasal Mefanamic Suprofen  Arthritis Strength Bufferin Dihydrocodeine Mepro Compound Suprol  Arthropan liquid Dopirydamole Methcarbomol with Aspirin Synalgos  ASA tablets/Enseals Disalcid Micrainin Tagament  Ascriptin Doan's Midol Talwin  Ascriptin A/D Dolene Mobidin Tanderil  Ascriptin Extra Strength Dolobid Moblgesic Ticlid  Ascriptin with Codeine Doloprin or Doloprin with Codeine Momentum Tolectin  Asperbuf Duoprin Mono-gesic Trendar  Aspergum Duradyne Motrin or Motrin IB Triminicin  Aspirin plain, buffered or enteric coated Durasal Myochrisine Trigesic  Aspirin Suppositories Easprin Nalfon Trillsate  Aspirin with Codeine Ecotrin Regular or Extra Strength Naprosyn Uracel  Atromid-S Efficin Naproxen  Ursinus  Auranofin Capsules Elmiron Neocylate Vanquish  Axotal Emagrin Norgesic Verin  Azathioprine Empirin or Empirin with Codeine Normiflo Vitamin E  Azolid Emprazil Nuprin Voltaren  Bayer Aspirin plain, buffered or children's or timed BC Tablets or powders Encaprin Orgaran Warfarin Sodium  Buff-a-Comp Enoxaparin Orudis Zorpin  Buff-a-Comp with Codeine Equegesic Os-Cal-Gesic   Buffaprin Excedrin plain, buffered or Extra Strength Oxalid   Bufferin Arthritis Strength Feldene Oxphenbutazone   Bufferin plain or Extra Strength Feldene Capsules Oxycodone with Aspirin   Bufferin with Codeine Fenoprofen Fenoprofen Pabalate or Pabalate-SF   Buffets II Flogesic Panagesic   Buffinol plain or Extra Strength Florinal or Florinal with Codeine Panwarfarin   Buf-Tabs Flurbiprofen Penicillamine   Butalbital Compound Four-way cold tablets Penicillin   Butazolidin Fragmin Pepto-Bismol   Carbenicillin Geminisyn Percodan   Carna Arthritis Reliever Geopen Persantine   Carprofen Gold's salt Persistin   Chloramphenicol Goody's Phenylbutazone   Chloromycetin Haltrain Piroxlcam   Clmetidine heparin Plaquenil   Cllnoril Hyco-pap Ponstel   Clofibrate Hydroxy chloroquine Propoxyphen         Before stopping any of these medications, be sure to consult the physician who ordered them.  Some, such as Coumadin (Warfarin) are ordered to prevent or treat serious conditions such as "deep thrombosis", "pumonary embolisms", and other heart problems.  The amount of time that you may need off of the medication may also vary with the medication and the reason for which you were taking it.  If you are taking any of these medications, please make sure you notify your pain physician before you undergo any procedures.           If you have any questions, please call #[phone] [facility]

## 2017-05-18 ENCOUNTER — Telehealth: Payer: Self-pay

## 2017-05-18 NOTE — Telephone Encounter (Signed)
Called patient to let her know that we usually do not perform anything elective when there is an active infection going on.  Voicemail left that she could call back and we would reschedule at her convenience.

## 2017-05-19 ENCOUNTER — Encounter: Payer: Self-pay | Admitting: Student in an Organized Health Care Education/Training Program

## 2017-05-19 ENCOUNTER — Ambulatory Visit (HOSPITAL_BASED_OUTPATIENT_CLINIC_OR_DEPARTMENT_OTHER): Payer: Medicare Other | Admitting: Student in an Organized Health Care Education/Training Program

## 2017-05-19 ENCOUNTER — Ambulatory Visit
Admission: RE | Admit: 2017-05-19 | Discharge: 2017-05-19 | Disposition: A | Discharge status: Home / Self Care | Payer: Medicare Other | Source: Ambulatory Visit | Attending: Student in an Organized Health Care Education/Training Program | Admitting: Student in an Organized Health Care Education/Training Program

## 2017-05-19 ENCOUNTER — Other Ambulatory Visit: Payer: Self-pay

## 2017-05-19 VITALS — BP 177/63 | HR 81 | Temp 98.4°F | Resp 28 | Ht 66.0 in | Wt 294.0 lb

## 2017-05-19 DIAGNOSIS — M5136 Other intervertebral disc degeneration, lumbar region: Secondary | ICD-10-CM | POA: Diagnosis not present

## 2017-05-19 DIAGNOSIS — M47816 Spondylosis without myelopathy or radiculopathy, lumbar region: Secondary | ICD-10-CM | POA: Diagnosis not present

## 2017-05-19 DIAGNOSIS — M51369 Other intervertebral disc degeneration, lumbar region without mention of lumbar back pain or lower extremity pain: Secondary | ICD-10-CM

## 2017-05-19 MED ORDER — LIDOCAINE HCL (PF) 1 % IJ SOLN
10.0000 mL | Freq: Once | INTRAMUSCULAR | Status: AC
  Administered 2017-05-19: 5 mL
  Filled 2017-05-19: qty 10

## 2017-05-19 MED ORDER — FENTANYL CITRATE (PF) 100 MCG/2ML IJ SOLN: 25.0000 ug | INTRAMUSCULAR | Status: DC | PRN

## 2017-05-19 MED ORDER — LACTATED RINGERS IV SOLN: 1000.0000 mL | Freq: Once | INTRAVENOUS | Status: DC

## 2017-05-19 MED ORDER — ROPIVACAINE HCL 2 MG/ML IJ SOLN
INTRAMUSCULAR | Status: AC
  Filled 2017-05-19: qty 10

## 2017-05-19 MED ORDER — ROPIVACAINE HCL 2 MG/ML IJ SOLN
10.0000 mL | Freq: Once | INTRAMUSCULAR | Status: AC
  Administered 2017-05-19: 10 mL
  Filled 2017-05-19: qty 10

## 2017-05-19 NOTE — Progress Notes (Signed)
Patient's Name: [name]  MRN: [id number]  Referring Provider: Lauro RegulusAnderson, Marshall W, MD  DOB: 10/23/1945  PCP: Lauro RegulusAnderson, Marshall W, MD  DOS: 05/19/2017  Note by: Edward JollyBilal Brennan Litzinger, MD  Service setting: Ambulatory outpatient  Specialty: Interventional Pain Management  Patient type: Established  Location: [facility] (AMB) Pain Management Facility  Visit type: Interventional Procedure   Primary Reason for Visit: Interventional Pain Management Treatment. CC: Back Pain (low)  Procedure:  Anesthesia, Analgesia, Anxiolysis:  Type: Diagnostic Medial Branch Facet Block #2 Region: Lumbar Level: L3, L4, L5,Medial Branch Level(s) Laterality: Bilateral  Type: Local Anesthesia Local Anesthetic: Lidocaine 1% Route: Infiltration ([place]/IM) IV Access: Declined Sedation: Meaningful verbal contact was maintained at all times during the procedure  Indication(s): Analgesia and Anxiety   Indications: 1. Lumbar spondylosis   2. Lumbar facet arthropathy   3. Lumbar degenerative disc disease    Pain Score: Pre-procedure: 6 /10 Post-procedure: 0-No pain/10  Pre-op Assessment:  [name] is a 71 y.o. (year old), female patient, seen today for interventional treatment. She  has a past surgical history that includes Reduction mammaplasty (Bilateral); Ankle reconstruction (Left); Brachioplasty; Cataract extraction; Cholecystectomy; Soft Tissue Tumor Resection; Abdominal hysterectomy (07/12/2014); Joint replacement (Bilateral, 07/2014, 03/2015); nasal reconstrucion; Toe Surgery (Right); Shoulder open rotator cuff repair; and Colonoscopy with propofol (N/A, 12/07/2016). [name] has a current medication list which includes the following prescription(s): amantadine, amlodipine, vitamin c, aspirin ec, azelastine, b complex vitamins, baclofen, carvedilol, celecoxib, vitamin d3, donepezil, esomeprazole, folic acid, levothyroxine, losartan, melatonin, montelukast, multivitamin,  onabotulinumtoxina, potassium chloride sa, prednisone, torsemide, tramadol, trazodone, venlafaxine, and tapentadol, and the following Facility-Administered Medications: fentanyl and lactated ringers. Her primarily concern today is the Back Pain (low)  Initial Vital Signs: Blood pressure 126/75, pulse 76, temperature 98.4 F (36.9 C), resp. rate 18, height 5\' 3"  (1.6 m), weight 290 lb (131.5 kg), SpO2 96 %. BMI: Estimated body mass index is 47.45 kg/m as calculated from the following:   Height as of this encounter: 5\' 6"  (1.676 m).   Weight as of this encounter: 294 lb (133.4 kg).  Risk Assessment: Allergies: Reviewed. She is allergic to codeine; hydromorphone; latex; oxycodone-acetaminophen; sulfa antibiotics; bupropion; gabapentin; hydrocodone; morphine and related; and procaine.  Allergy Precautions: None required Coagulopathies: Reviewed. None identified.  Blood-thinner therapy: None at this time Active Infection(s): Reviewed. None identified. [name] is afebrile  Site Confirmation: [name] was asked to confirm the procedure and laterality before marking the site Procedure checklist: Completed Consent: Before the procedure and under the influence of no sedative(s), amnesic(s), or anxiolytics, the patient was informed of the treatment options, risks and possible complications. To fulfill our ethical and legal obligations, as recommended by the American Medical Association's Code of Ethics, I have informed the patient of my clinical impression; the nature and purpose of the treatment or procedure; the risks, benefits, and possible complications of the intervention; the alternatives, including doing nothing; the risk(s) and benefit(s) of the alternative treatment(s) or procedure(s); and the risk(s) and benefit(s) of doing nothing. The patient was provided information about the general risks and possible complications associated with the procedure. These may  include, but are not limited to: failure to achieve desired goals, infection, bleeding, organ or nerve damage, allergic reactions, paralysis, and death. In addition, the patient was informed of those risks and complications associated to Spine-related procedures, such as failure to decrease pain; infection (i.e.: Meningitis, epidural or intraspinal abscess); bleeding (i.e.: epidural hematoma, subarachnoid hemorrhage, or any other type  of intraspinal or peri-dural bleeding); organ or nerve damage (i.e.: Any type of peripheral nerve, nerve root, or spinal cord injury) with subsequent damage to sensory, motor, and/or autonomic systems, resulting in permanent pain, numbness, and/or weakness of one or several areas of the body; allergic reactions; (i.e.: anaphylactic reaction); and/or death. Furthermore, the patient was informed of those risks and complications associated with the medications. These include, but are not limited to: allergic reactions (i.e.: anaphylactic or anaphylactoid reaction(s)); adrenal axis suppression; blood sugar elevation that in diabetics may result in ketoacidosis or comma; water retention that in patients with history of congestive heart failure may result in shortness of breath, pulmonary edema, and decompensation with resultant heart failure; weight gain; swelling or edema; medication-induced neural toxicity; particulate matter embolism and blood vessel occlusion with resultant organ, and/or nervous system infarction; and/or aseptic necrosis of one or more joints. Finally, the patient was informed that Medicine is not an exact science; therefore, there is also the possibility of unforeseen or unpredictable risks and/or possible complications that may result in a catastrophic outcome. The patient indicated having understood very clearly. We have given the patient no guarantees and we have made no promises. Enough time was given to the patient to ask questions, all of which were answered  to the patient's satisfaction. [name] has indicated that she wanted to continue with the procedure. Attestation: I, the ordering provider, attest that I have discussed with the patient the benefits, risks, side-effects, alternatives, likelihood of achieving goals, and potential problems during recovery for the procedure that I have provided informed consent. Date: 05/19/2017; Time: 9:08 AM  Pre-Procedure Preparation:  Monitoring: As per clinic protocol. Respiration, ETCO2, SpO2, BP, heart rate and rhythm monitor placed and checked for adequate function Safety Precautions: Patient was assessed for positional comfort and pressure points before starting the procedure. Time-out: I initiated and conducted the "Time-out" before starting the procedure, as per protocol. The patient was asked to participate by confirming the accuracy of the "Time Out" information. Verification of the correct person, site, and procedure were performed and confirmed by me, the nursing staff, and the patient. "Time-out" conducted as per Joint Commission's Universal Protocol (UP.01.01.01). "Time-out" Date & Time: 05/19/2017; 0945 hrs.  Description of Procedure Process:   Position: Prone Target Area: For Lumbar Facet blocks, the target is the groove formed by the junction of the transverse process and superior articular process. For the L5 dorsal ramus, the target is the notch between superior articular process and sacral ala.  Approach: Paramedial approach. Area Prepped: Entire Posterior Lumbosacral Region Prepping solution: ChloraPrep (2% chlorhexidine gluconate and 70% isopropyl alcohol) Safety Precautions: Aspiration looking for blood return was conducted prior to all injections. At no point did we inject any substances, as a needle was being advanced. No attempts were made at seeking any paresthesias. Safe injection practices and needle disposal techniques used. Medications properly checked for expiration  dates. SDV (single dose vial) medications used. Description of the Procedure: Protocol guidelines were followed. The patient was placed in position over the fluoroscopy table. The target area was identified and the area prepped in the usual manner. Skin desensitized using vapocoolant spray. Skin & deeper tissues infiltrated with local anesthetic. Appropriate amount of time allowed to pass for local anesthetics to take effect. The procedure needle was introduced through the skin, ipsilateral to the reported pain, and advanced to the target area. Employing the "Medial Branch Technique", the needles were advanced to the angle made by the superior and medial  portion of the transverse process, and the lateral and inferior portion of the superior articulating process of the targeted vertebral bodies. This area is known as "Burton's Eye" or the "Eye of the [place] Dog". A procedure needle was introduced through the skin, and this time advanced to the angle made by the superior and medial border of the sacral ala.  Negative aspiration confirmed. Solution injected in intermittent fashion, asking for systemic symptoms every 0.5cc of injectate. The needles were then removed and the area cleansed, making sure to leave some of the prepping solution back to take advantage of its long term bactericidal properties.   Illustration of the posterior view of the lumbar spine and the posterior neural structures. Laminae of L2 through S1 are labeled. DPRL5, dorsal primary ramus of L5; DPRS1, dorsal primary ramus of S1; DPR3, dorsal primary ramus of L3; FJ, facet (zygapophyseal) joint L3-L4; I, inferior articular process of L4; LB1, lateral branch of dorsal primary ramus of L1; IAB, inferior articular branches from L3 medial branch (supplies L4-L5 facet joint); IBP, intermediate branch plexus; MB3, medial branch of dorsal primary ramus of L3; NR3, third lumbar nerve root; S, superior articular process of L5; SAB, superior articular  branches from L4 (supplies L4-5 facet joint also); TP3, transverse process of L3.  Vitals:   05/19/17 0950 05/19/17 0955 05/19/17 1000 05/19/17 1005  BP: (!) 152/95 (!) 160/82 (!) 155/99 (!) 177/63  Pulse:      Resp: 20 16 14  (!) 28  Temp:      TempSrc:      SpO2: 100% 100% 99% 99%  Weight:      Height:        Start Time: 0945 hrs. End Time: 1005 hrs. Materials:  Needle(s) Type: Regular needle Gauge: 22G Length: 5-in Medication(s): We administered lidocaine (PF) and ropivacaine (PF) 2 mg/mL (0.2%). Please see chart orders for dosing details. 1.5 cc of 0.2% ropivacaine at each level. Imaging Guidance (Spinal):  Type of Imaging Technique: Fluoroscopy Guidance (Spinal) Indication(s): Assistance in needle guidance and placement for procedures requiring needle placement in or near specific anatomical locations not easily accessible without such assistance. Exposure Time: Please see nurses notes. Contrast: None used. Fluoroscopic Guidance: I was personally present during the use of fluoroscopy. "Tunnel Vision Technique" used to obtain the best possible view of the target area. Parallax error corrected before commencing the procedure. "Direction-depth-direction" technique used to introduce the needle under continuous pulsed fluoroscopy. Once target was reached, antero-posterior, oblique, and lateral fluoroscopic projection used confirm needle placement in all planes. Images permanently stored in EMR. Interpretation: No contrast injected. I personally interpreted the imaging intraoperatively. Adequate needle placement confirmed in multiple planes. Permanent images saved into the patient's record.  Antibiotic Prophylaxis:  Indication(s): None identified Antibiotic given: None  Post-operative Assessment:  EBL: None Complications: No immediate post-treatment complications observed by team, or reported by patient. Note: The patient tolerated the entire procedure well. A repeat set of vitals  were taken after the procedure and the patient was kept under observation following institutional policy, for this type of procedure. Post-procedural neurological assessment was performed, showing return to baseline, prior to discharge. The patient was provided with post-procedure discharge instructions, including a section on how to identify potential problems. Should any problems arise concerning this procedure, the patient was given instructions to immediately contact us, at any time, without hesitation. In any case, we plan to contact the patient by telephone for a follow-up status report regarding this interventional procedure. Comments:  No  additional relevant information. 5 out of 5 strength bilateral lower extremity: Plantar flexion, dorsiflexion, knee flexion, knee extension.  Plan of Care  Follow-up in 2 weeks for postprocedural evaluation   Imaging Orders     DG C-Arm 1-60 Min-No Report Procedure Orders    No procedure(s) ordered today    Medications ordered for procedure: Meds ordered this encounter  Medications  . lactated ringers infusion 1,000 mL  . fentaNYL (SUBLIMAZE) injection 25-100 mcg    Make sure Narcan is available in the pyxis when using this medication. In the event of respiratory depression (RR< 8/min): Titrate NARCAN (naloxone) in increments of 0.1 to 0.2 mg IV at 2-3 minute intervals, until desired degree of reversal.  . lidocaine (PF) (XYLOCAINE) 1 % injection 10 mL  . ropivacaine (PF) 2 mg/mL (0.2%) (NAROPIN) injection 10 mL   Medications administered: We administered lidocaine (PF) and ropivacaine (PF) 2 mg/mL (0.2%).  See the medical record for exact dosing, route, and time of administration. Disposition: Discharge home  Discharge Date & Time: 05/19/2017; 1020 hrs.   Physician-requested Follow-up: Return in about 2 weeks (around 06/02/2017) for Post Procedure Evaluation. Future Appointments  Date Time Provider Department Center  06/02/2017 12:00 PM  Edward Jolly, MD [facility] None   Primary Care Physician: Lauro Regulus, MD Location: [facility] Facility Note by: Edward Jolly, MD Date: 05/19/2017; Time: 12:25 PM  Disclaimer:  Medicine is not an exact science. The only guarantee in medicine is that nothing is guaranteed. It is important to note that the decision to proceed with this intervention was based on the information collected from the patient. The Data and conclusions were drawn from the patient's questionnaire, the interview, and the physical examination. Because the information was provided in large part by the patient, it cannot be guaranteed that it has not been purposely or unconsciously manipulated. Every effort has been made to obtain as much relevant data as possible for this evaluation. It is important to note that the conclusions that lead to this procedure are derived in large part from the available data. Always take into account that the treatment will also be dependent on availability of resources and existing treatment guidelines, considered by other Pain Management Practitioners as being common knowledge and practice, at the time of the intervention. For Medico-Legal purposes, it is also important to point out that variation in procedural techniques and pharmacological choices are the acceptable norm. The indications, contraindications, technique, and results of the above procedure should only be interpreted and judged by a Board-Certified Interventional Pain Specialist with extensive familiarity and expertise in the same exact procedure and technique.

## 2017-05-19 NOTE — Progress Notes (Signed)
Safety precautions to be maintained throughout the outpatient stay will include: orient to surroundings, keep bed in low position, maintain call bell within reach at all times, provide assistance with transfer out of bed and ambulation.  

## 2017-05-19 NOTE — Patient Instructions (Signed)
Pain Management Discharge Instructions  General Discharge Instructions :  If you need to reach your doctor call: Monday-Friday 8:00 am - 4:00 pm at [phone] or toll free [phone].  After clinic hours [phone] to have operator reach doctor.  Bring all of your medication bottles to all your appointments in the pain clinic.  To cancel or reschedule your appointment with Pain Management please remember to call 24 hours in advance to avoid a fee.  Refer to the educational materials which you have been given on: General Risks, I had my Procedure. Discharge Instructions, Post Sedation.  Post Procedure Instructions:  The drugs you were given will stay in your system until tomorrow, so for the next 24 hours you should not drive, make any legal decisions or drink any alcoholic beverages.  You may eat anything you prefer, but it is better to start with liquids then soups and crackers, and gradually work up to solid foods.  Please notify your doctor immediately if you have any unusual bleeding, trouble breathing or pain that is not related to your normal pain.  Depending on the type of procedure that was done, some parts of your body may feel week and/or numb.  This usually clears up by tonight or the next day.  Walk with the use of an assistive device or accompanied by an adult for the 24 hours.  You may use ice on the affected area for the first 24 hours.  Put ice in a Ziploc bag and cover with a towel and place against area 15 minutes on 15 minutes off.  You may switch to heat after 24 hours.Facet Joint Block The facet joints connect the bones of the spine (vertebrae). They make it possible for you to bend, twist, and make other movements with your spine. They also keep you from bending too far, twisting too far, and making other excessive movements. A facet joint block is a procedure where a numbing medicine (anesthetic) is injected into a facet joint. Often, a type of  anti-inflammatory medicine called a steroid is also injected. A facet joint block may be done to diagnose neck or back pain. If the pain gets better after a facet joint block, it means the pain is probably coming from the facet joint. If the pain does not get better, it means the pain is probably not coming from the facet joint. A facet joint block may also be done to relieve neck or back pain caused by an inflamed facet joint. A facet joint block is only done to relieve pain if the pain does not improve with other methods, such as medicine, exercise programs, and physical therapy. Tell a health care provider about:  Any allergies you have.  All medicines you are taking, including vitamins, herbs, eye drops, creams, and over-the-counter medicines.  Any problems you or family members have had with anesthetic medicines.  Any blood disorders you have.  Any surgeries you have had.  Any medical conditions you have.  Whether you are pregnant or may be pregnant. What are the risks? Generally, this is a safe procedure. However, problems may occur, including:  Bleeding.  Injury to a nerve near the injection site.  Pain at the injection site.  Weakness or numbness in areas controlled by nerves near the injection site.  Infection.  Temporary fluid retention.  Allergic reactions to medicines or dyes.  Injury to other structures or organs near the injection site.  What happens before the procedure?  Follow instructions from your health   care provider about eating or drinking restrictions.  Ask your health care provider about: ? Changing or stopping your regular medicines. This is especially important if you are taking diabetes medicines or blood thinners. ? Taking medicines such as aspirin and ibuprofen. These medicines can thin your blood. Do not take these medicines before your procedure if your health care provider instructs you not to.  Do not take any new dietary supplements or  medicines without asking your health care provider first.  Plan to have someone take you home after the procedure. What happens during the procedure?  You may need to remove your clothing and dress in an open-back gown.  The procedure will be done while you are lying on an X-ray table. You will most likely be asked to lie on your stomach, but you may be asked to lie in a different position if an injection will be made in your neck.  Machines will be used to monitor your oxygen levels, heart rate, and blood pressure.  If an injection will be made in your neck, an IV tube will be inserted into one of your veins. Fluids and medicine will flow directly into your body through the IV tube.  The area over the facet joint where the injection will be made will be cleaned with soap. The surrounding skin will be covered with clean drapes.  A numbing medicine (local anesthetic) will be applied to your skin. Your skin may sting or burn for a moment.  A video X-ray machine (fluoroscopy) will be used to locate the joint. In some cases, a CT scan may be used.  A contrast dye may be injected into the facet joint area to help locate the joint.  When the joint is located, an anesthetic will be injected into the joint through the needle.  Your health care provider will ask you whether you feel pain relief. If you do feel relief, a steroid may be injected to provide pain relief for a longer period of time. If you do not feel relief or feel only partial relief, additional injections of an anesthetic may be made in other facet joints.  The needle will be removed.  Your skin will be cleaned.  A bandage (dressing) will be applied over each injection site. The procedure may vary among health care providers and hospitals. What happens after the procedure?  You will be observed for 15-30 minutes before being allowed to go home. This information is not intended to replace advice given to you by your health care  provider. Make sure you discuss any questions you have with your health care provider. Document Released: 10/21/2006 Document Revised: 07/03/2015 Document Reviewed: 02/25/2015 [employer] Interactive Patient Education  2018 [employer]  

## 2017-05-20 ENCOUNTER — Telehealth: Payer: Self-pay

## 2017-06-02 ENCOUNTER — Encounter: Payer: Self-pay | Admitting: Student in an Organized Health Care Education/Training Program

## 2017-06-02 ENCOUNTER — Ambulatory Visit
Payer: Medicare Other | Attending: Student in an Organized Health Care Education/Training Program | Admitting: Student in an Organized Health Care Education/Training Program

## 2017-06-02 ENCOUNTER — Other Ambulatory Visit: Payer: Self-pay

## 2017-06-02 VITALS — BP 148/81 | HR 69 | Temp 97.8°F | Resp 18 | Ht 66.0 in | Wt 285.0 lb

## 2017-06-02 DIAGNOSIS — M51369 Other intervertebral disc degeneration, lumbar region without mention of lumbar back pain or lower extremity pain: Secondary | ICD-10-CM

## 2017-06-02 DIAGNOSIS — E039 Hypothyroidism, unspecified: Secondary | ICD-10-CM | POA: Diagnosis not present

## 2017-06-02 DIAGNOSIS — M5136 Other intervertebral disc degeneration, lumbar region: Secondary | ICD-10-CM | POA: Insufficient documentation

## 2017-06-02 DIAGNOSIS — Z885 Allergy status to narcotic agent status: Secondary | ICD-10-CM | POA: Insufficient documentation

## 2017-06-02 DIAGNOSIS — M47816 Spondylosis without myelopathy or radiculopathy, lumbar region: Secondary | ICD-10-CM | POA: Diagnosis not present

## 2017-06-02 DIAGNOSIS — E785 Hyperlipidemia, unspecified: Secondary | ICD-10-CM | POA: Insufficient documentation

## 2017-06-02 DIAGNOSIS — E876 Hypokalemia: Secondary | ICD-10-CM | POA: Diagnosis not present

## 2017-06-02 DIAGNOSIS — Z6841 Body Mass Index (BMI) 40.0 and over, adult: Secondary | ICD-10-CM | POA: Insufficient documentation

## 2017-06-02 DIAGNOSIS — Z79899 Other long term (current) drug therapy: Secondary | ICD-10-CM | POA: Insufficient documentation

## 2017-06-02 DIAGNOSIS — G8929 Other chronic pain: Secondary | ICD-10-CM | POA: Insufficient documentation

## 2017-06-02 DIAGNOSIS — G4730 Sleep apnea, unspecified: Secondary | ICD-10-CM | POA: Diagnosis not present

## 2017-06-02 DIAGNOSIS — M47896 Other spondylosis, lumbar region: Secondary | ICD-10-CM | POA: Diagnosis not present

## 2017-06-02 DIAGNOSIS — M545 Low back pain: Secondary | ICD-10-CM | POA: Diagnosis present

## 2017-06-02 DIAGNOSIS — I10 Essential (primary) hypertension: Secondary | ICD-10-CM | POA: Insufficient documentation

## 2017-06-02 DIAGNOSIS — F329 Major depressive disorder, single episode, unspecified: Secondary | ICD-10-CM | POA: Diagnosis not present

## 2017-06-02 DIAGNOSIS — M1288 Other specific arthropathies, not elsewhere classified, other specified site: Secondary | ICD-10-CM | POA: Diagnosis not present

## 2017-06-02 DIAGNOSIS — Z79890 Hormone replacement therapy (postmenopausal): Secondary | ICD-10-CM | POA: Diagnosis not present

## 2017-06-02 DIAGNOSIS — K219 Gastro-esophageal reflux disease without esophagitis: Secondary | ICD-10-CM | POA: Diagnosis not present

## 2017-06-02 DIAGNOSIS — K589 Irritable bowel syndrome without diarrhea: Secondary | ICD-10-CM | POA: Insufficient documentation

## 2017-06-02 DIAGNOSIS — Z7982 Long term (current) use of aspirin: Secondary | ICD-10-CM | POA: Insufficient documentation

## 2017-06-02 NOTE — Progress Notes (Signed)
Patient's Name: [name]  MRN: [id number]  Referring Provider: Lauro Regulus, MD  DOB: 30-Oct-1945  PCP: Lauro Regulus, MD  DOS: 06/02/2017  Note by: Edward Jolly, MD  Service setting: Ambulatory outpatient  Specialty: Interventional Pain Management  Location: [facility] (AMB) Pain Management Facility    Patient type: Established   Primary Reason(s) for Visit: Encounter for post-procedure evaluation of chronic illness with mild to moderate exacerbation CC: Back Pain (lower)  HPI  [name] is a 71 y.o. year old, female patient, who comes today for a post-procedure evaluation. She has Lumbar spondylosis; Lumbar facet arthropathy; and Lumbar degenerative disc disease on their problem list. Her primarily concern today is the Back Pain (lower)  Pain Assessment: Location: Lower Back Radiating:   Onset: More than a month ago Duration: Chronic pain Quality: Constant, Aching Severity: 4 /10 (self-reported pain score)  Note: Reported level is compatible with observation.                         When using our objective Pain Scale, levels between 6 and 10/10 are said to belong in an emergency room, as it progressively worsens from a 6/10, described as severely limiting, requiring emergency care not usually available at an outpatient pain management facility. At a 6/10 level, communication becomes difficult and requires great effort. Assistance to reach the emergency department may be required. Facial flushing and profuse sweating along with potentially dangerous increases in heart rate and blood pressure will be evident. Effect on ADL:   Timing: Constant Modifying factors: sitting/lying down  [name] comes in today for post-procedure evaluation after the treatment done on 05/19/2017.  Further details on both, my assessment(s), as well as the proposed treatment plan, please see below.  Post-Procedure Assessment  05/19/2017 Procedure:  Bilateral L3-L5 lumbar medial branch nerve block Pre-procedure pain score:  6/10 Post-procedure pain score: 0/10         Influential Factors: BMI: 46.00 kg/m Intra-procedural challenges: None observed.         Assessment challenges: None detected.              Reported side-effects: None.        Post-procedural adverse reactions or complications: None reported         Sedation: Please see nurses note. When no sedatives are used, the analgesic levels obtained are directly associated to the effectiveness of the local anesthetics. However, when sedation is provided, the level of analgesia obtained during the initial 1 hour following the intervention, is believed to be the result of a combination of factors. These factors may include, but are not limited to: 1. The effectiveness of the local anesthetics used. 2. The effects of the analgesic(s) and/or anxiolytic(s) used. 3. The degree of discomfort experienced by the patient at the time of the procedure. 4. The patients ability and reliability in recalling and recording the events. 5. The presence and influence of possible secondary gains and/or psychosocial factors. Reported result: Relief experienced during the 1st hour after the procedure: 100 % (Ultra-Short Term Relief)            Interpretative annotation: Clinically appropriate result. Analgesia during this period is likely to be Local Anesthetic and/or IV Sedative (Analgesic/Anxiolytic) related.          Effects of local anesthetic: The analgesic effects attained during this period are directly associated to the localized infiltration of local anesthetics and therefore cary significant diagnostic value  as to the etiological location, or anatomical origin, of the pain. Expected duration of relief is directly dependent on the pharmacodynamics of the local anesthetic used. Long-acting (4-6 hours) anesthetics used.  Reported result: Relief during the next 4 to 6 hour after the procedure: 100 %  (Short-Term Relief)            Interpretative annotation: Clinically appropriate result. Analgesia during this period is likely to be Local Anesthetic-related.          Long-term benefit: Defined as the period of time past the expected duration of local anesthetics (1 hour for short-acting and 4-6 hours for long-acting). With the possible exception of prolonged sympathetic blockade from the local anesthetics, benefits during this period are typically attributed to, or associated with, other factors such as analgesic sensory neuropraxia, antiinflammatory effects, or beneficial biochemical changes provided by agents other than the local anesthetics.  Reported result: Extended relief following procedure: 50 % (Long-Term Relief)            Interpretative annotation: Clinically appropriate result. Good relief. No long-term benefit expected.   Current benefits: Defined as reported results that persistent at this point in time.   Analgesia: 25-50 %            Function: Somewhat improved ROM: Somewhat improved Interpretative annotation: Short-term benefit. Therapeutic benefit observed. Effective diagnostic intervention.          Interpretation: Results would suggest a successful diagnostic intervention. Proceed with radiofrequency ablation starting with the left side first.          Plan:  Please see "Plan of Care" for details.        Meds   Current Outpatient Medications:  .  amantadine (SYMMETREL) 100 MG capsule, Take 100 mg by mouth 2 (two) times daily., Disp: , Rfl:  .  amLODipine (NORVASC) 5 MG tablet, Take 5 mg by mouth daily., Disp: , Rfl:  .  Ascorbic Acid (VITAMIN C) 1000 MG tablet, Take 1,000 mg daily by mouth., Disp: , Rfl:  .  aspirin EC 81 MG tablet, Take 81 mg by mouth daily., Disp: , Rfl:  .  azelastine (ASTELIN) 0.1 % nasal spray, 2 sprays as needed for rhinitis. Use in each nostril as directed, Disp: , Rfl:  .  B Complex Vitamins (VITAMIN-B COMPLEX PO), Take 100 mg daily by mouth.,  Disp: , Rfl:  .  baclofen (LIORESAL) 20 MG tablet, Take 20 mg by mouth daily as needed for muscle spasms., Disp: , Rfl:  .  carvedilol (COREG) 25 MG tablet, Take 25 mg 4 (four) times daily by mouth. , Disp: , Rfl:  .  celecoxib (CELEBREX) 200 MG capsule, Take 200 mg by mouth 2 (two) times daily., Disp: , Rfl:  .  Cholecalciferol (VITAMIN D3) 2000 units TABS, Take 2,000 Units daily by mouth., Disp: , Rfl:  .  donepezil (ARICEPT) 10 MG tablet, Take 10 mg by mouth at bedtime., Disp: , Rfl:  .  esomeprazole (NEXIUM) 40 MG capsule, Take 40 mg by mouth daily., Disp: , Rfl:  .  folic acid (FOLVITE) 400 MCG tablet, Take 400 mcg by mouth 2 (two) times daily., Disp: , Rfl:  .  levothyroxine (SYNTHROID, LEVOTHROID) 112 MCG tablet, Take 112 mcg by mouth daily., Disp: , Rfl:  .  losartan (COZAAR) 100 MG tablet, Take 100 mg by mouth daily., Disp: , Rfl:  .  Melatonin 3 MG TABS, Take 1 tablet at bedtime by mouth. , Disp: , Rfl:  .  montelukast (SINGULAIR) 10 MG tablet, Take 10 mg by mouth every evening., Disp: , Rfl:  .  Multiple Vitamin (MULTIVITAMIN) tablet, Take 1 tablet by mouth daily., Disp: , Rfl:  .  OnabotulinumtoxinA (BOTOX IJ), Inject every 4 (four) months as directed., Disp: , Rfl:  .  potassium chloride SA (K-DUR,KLOR-CON) 20 MEQ tablet, Take 20 mEq by mouth 3 (three) times daily., Disp: , Rfl:  .  torsemide (DEMADEX) 10 MG tablet, Take 10 mg daily by mouth., Disp: , Rfl:  .  traMADol (ULTRAM) 50 MG tablet, Take 50 mg by mouth every 12 (twelve) hours as needed. , Disp: , Rfl:  .  traZODone (DESYREL) 50 MG tablet, Take by mouth at bedtime. 50-150mg  nightly as needed, Disp: , Rfl:  .  venlafaxine (EFFEXOR) 75 MG tablet, Take 75 mg by mouth 3 (three) times daily., Disp: , Rfl:  .  predniSONE (STERAPRED UNI-PAK 21 TAB) 5 MG (21) TBPK tablet, Take 5 mg by mouth daily., Disp: , Rfl:  .  tapentadol (NUCYNTA) 50 MG tablet, Take 50 mg by mouth as needed. , Disp: , Rfl:   ROS  Constitutional: Denies any  fever or chills Gastrointestinal: No reported hemesis, hematochezia, vomiting, or acute GI distress Musculoskeletal: Denies any acute onset joint swelling, redness, loss of ROM, or weakness Neurological: No reported episodes of acute onset apraxia, aphasia, dysarthria, agnosia, amnesia, paralysis, loss of coordination, or loss of consciousness  Allergies  [name] is allergic to codeine; hydromorphone; latex; oxycodone-acetaminophen; sulfa antibiotics; bupropion; gabapentin; hydrocodone; morphine and related; and procaine.  PFSH  Drug: [name]  reports that she does not use drugs. Alcohol:  reports that she drinks about 1.2 oz of alcohol per week. Tobacco:  reports that  has never smoked. she has never used smokeless tobacco. Medical:  has a past medical history of Adenomatous polyp of colon (09/17/2016), Benign positional vertigo (03/04/2016), Brain injury (HCC), Climacteric, Depression, DJD (degenerative joint disease), Dyslipidemia, GERD (gastroesophageal reflux disease), Hypertension, Hypokalemia, Hypothyroidism, IBS (irritable bowel syndrome), OAB (overactive bladder), Ovarian cyst (03/25/2015), Rhinitis, Sleep apnea, and Spasm of thoracolumbar muscle. Surgical: [name]  has a past surgical history that includes Reduction mammaplasty (Bilateral); Ankle reconstruction (Left); Brachioplasty; Cataract extraction; Cholecystectomy; Soft Tissue Tumor Resection; Abdominal hysterectomy (07/12/2014); Joint replacement (Bilateral, 07/2014, 03/2015); nasal reconstrucion; Toe Surgery (Right); Shoulder open rotator cuff repair; and Colonoscopy with propofol (N/A, 12/07/2016). Family: family history includes Breast cancer in her other.  Constitutional Exam  General appearance: alert, cooperative and morbidly obese Vitals:   06/02/17 1151  BP: (!) 148/81  Pulse: 69  Resp: 18  Temp: 97.8 F (36.6 C)  TempSrc: Oral  SpO2: 98%  Weight: 285 lb (129.3  kg)  Height: 5\' 6"  (1.676 m)   BMI Assessment: Estimated body mass index is 46 kg/m as calculated from the following:   Height as of this encounter: 5\' 6"  (1.676 m).   Weight as of this encounter: 285 lb (129.3 kg).  BMI interpretation table: BMI level Category Range association with higher incidence of chronic pain  <18 kg/m2 Underweight   18.5-24.9 kg/m2 Ideal body weight   25-29.9 kg/m2 Overweight Increased incidence by 20%  30-34.9 kg/m2 Obese (Class I) Increased incidence by 68%  35-39.9 kg/m2 Severe obesity (Class II) Increased incidence by 136%  >40 kg/m2 Extreme obesity (Class III) Increased incidence by 254%   BMI Readings from Last 4 Encounters:  06/02/17 46.00 kg/m  05/19/17 47.45 kg/m  05/11/17 45.84 kg/m  04/26/17 51.37 kg/m  Wt Readings from Last 4 Encounters:  06/02/17 285 lb (129.3 kg)  05/19/17 294 lb (133.4 kg)  05/11/17 284 lb (128.8 kg)  04/26/17 290 lb (131.5 kg)  Psych/Mental status: Alert, oriented x 3 (person, place, & time)       Eyes: PERLA Respiratory: No evidence of acute respiratory distress  Cervical Spine Area Exam  Skin & Axial Inspection: No masses, redness, edema, swelling, or associated skin lesions Alignment: Symmetrical Functional ROM: Unrestricted ROM      Stability: No instability detected Muscle Tone/Strength: Functionally intact. No obvious neuro-muscular anomalies detected. Sensory (Neurological): Unimpaired Palpation: No palpable anomalies              Upper Extremity (UE) Exam    Side: Right upper extremity  Side: Left upper extremity  Skin & Extremity Inspection: Skin color, temperature, and hair growth are WNL. No peripheral edema or cyanosis. No masses, redness, swelling, asymmetry, or associated skin lesions. No contractures.  Skin & Extremity Inspection: Skin color, temperature, and hair growth are WNL. No peripheral edema or cyanosis. No masses, redness, swelling, asymmetry, or associated skin lesions. No contractures.   Functional ROM: Unrestricted ROM          Functional ROM: Unrestricted ROM          Muscle Tone/Strength: Functionally intact. No obvious neuro-muscular anomalies detected.  Muscle Tone/Strength: Functionally intact. No obvious neuro-muscular anomalies detected.  Sensory (Neurological): Unimpaired          Sensory (Neurological): Unimpaired          Palpation: No palpable anomalies              Palpation: No palpable anomalies              Specialized Test(s): Deferred         Specialized Test(s): Deferred          Thoracic Spine Area Exam  Skin & Axial Inspection: No masses, redness, or swelling Alignment: Symmetrical Functional ROM: Unrestricted ROM Stability: No instability detected Muscle Tone/Strength: Functionally intact. No obvious neuro-muscular anomalies detected. Sensory (Neurological): Unimpaired Muscle strength & Tone: No palpable anomalies  Lumbar Spine Area Exam  Skin & Axial Inspection: No masses, redness, or swelling Alignment: Symmetrical Functional ROM: Unrestricted ROM      Stability: No instability detected Muscle Tone/Strength: Functionally intact. No obvious neuro-muscular anomalies detected. Sensory (Neurological): Unimpaired Palpation: No palpable anomalies       Provocative Tests: Lumbar Hyperextension and rotation test: Positive bilaterally for facet joint pain.improved after treatment Lumbar Lateral bending test: evaluation deferred today       Patrick's Maneuver: evaluation deferred today                    Gait & Posture Assessment  Ambulation: Unassisted Gait: Relatively normal for age and body habitus Posture: WNL   Lower Extremity Exam    Side: Right lower extremity  Side: Left lower extremity  Skin & Extremity Inspection: Skin color, temperature, and hair growth are WNL. No peripheral edema or cyanosis. No masses, redness, swelling, asymmetry, or associated skin lesions. No contractures.  Skin & Extremity Inspection: Skin color, temperature, and  hair growth are WNL. No peripheral edema or cyanosis. No masses, redness, swelling, asymmetry, or associated skin lesions. No contractures.  Functional ROM: Unrestricted ROM          Functional ROM: Unrestricted ROM          Muscle Tone/Strength: Functionally intact. No obvious neuro-muscular anomalies detected.  Muscle  Tone/Strength: Functionally intact. No obvious neuro-muscular anomalies detected.  Sensory (Neurological): Unimpaired  Sensory (Neurological): Unimpaired  Palpation: No palpable anomalies  Palpation: No palpable anomalies   Assessment  Primary Diagnosis & Pertinent Problem List: The primary encounter diagnosis was Lumbar spondylosis. Diagnoses of Lumbar facet arthropathy and Lumbar degenerative disc disease were also pertinent to this visit.  Status Diagnosis  Responding Responding Persistent 1. Lumbar spondylosis   2. Lumbar facet arthropathy   3. Lumbar degenerative disc disease       71 year old female with a history of axial low back pain that radiates into bilateral thighs, left greater than right secondary to lumbar spondylosis, lumbar facet arthropathy, lumbar degenerative disc disease.  She is status post 2 series of bilateral lumbar facet blocks at L3/4, L4/5 and L5/S1 which provided significant pain relief for approximately 5-7 days after the procedure.  In addition the patient endorses improved range of motion and ease of walking for those 5-7 days after the procedure.  This was suggest a positive diagnostic block and will proceed with radiofrequency ablation starting with the left side first.  Risks and benefits were discussed.  Plan:  -Start with left L3/4, L4/5 and L5/S1 RFA with sedation followed by right 2 weeks after.  Time Note: Greater than 50% of the 25 minute(s) of face-to-face time spent with [name], was spent in counseling/coordination of care regarding: the treatment plan, the risks and possible complications of proposed treatment,  going over the informed consent, the results, interpretation and significance of  her recent diagnostic interventional treatment(s), the goals of pain management (increased in functionality) and the need to bring and keep the BMI below 30.  Lab-work, procedure(s), and/or referral(s): Orders Placed This Encounter  Procedures  . Radiofrequency,Lumbar    Provider-requested follow-up: Return in about 7 days (around 06/09/2017) for Procedure.  Future Appointments  Date Time Provider Department Center  06/09/2017  8:00 AM Edward JollyLateef, Aleen Marston, MD [facility] None    Primary Care Physician: Lauro RegulusAnderson, Marshall W, MD [facility] Note by: Edward JollyBilal Lilee Aldea, M.D Date: 06/02/2017; Time: 3:57 PM  There are no Patient Instructions on file for this visit.

## 2017-06-02 NOTE — Progress Notes (Signed)
Safety precautions to be maintained throughout the outpatient stay will include: orient to surroundings, keep bed in low position, maintain call bell within reach at all times, provide assistance with transfer out of bed and ambulation.  

## 2017-06-09 ENCOUNTER — Other Ambulatory Visit: Payer: Self-pay

## 2017-06-09 ENCOUNTER — Encounter: Payer: Self-pay | Admitting: Student in an Organized Health Care Education/Training Program

## 2017-06-09 ENCOUNTER — Ambulatory Visit
Admission: RE | Admit: 2017-06-09 | Discharge: 2017-06-09 | Disposition: A | Discharge status: Home / Self Care | Payer: Medicare Other | Source: Ambulatory Visit | Attending: Student in an Organized Health Care Education/Training Program | Admitting: Student in an Organized Health Care Education/Training Program

## 2017-06-09 ENCOUNTER — Ambulatory Visit (HOSPITAL_BASED_OUTPATIENT_CLINIC_OR_DEPARTMENT_OTHER): Payer: Medicare Other | Admitting: Student in an Organized Health Care Education/Training Program

## 2017-06-09 VITALS — BP 151/63 | HR 76 | Temp 98.2°F | Resp 17 | Wt 287.0 lb

## 2017-06-09 DIAGNOSIS — M47816 Spondylosis without myelopathy or radiculopathy, lumbar region: Secondary | ICD-10-CM

## 2017-06-09 DIAGNOSIS — M5136 Other intervertebral disc degeneration, lumbar region: Secondary | ICD-10-CM | POA: Insufficient documentation

## 2017-06-09 DIAGNOSIS — M545 Low back pain: Secondary | ICD-10-CM | POA: Diagnosis present

## 2017-06-09 MED ORDER — DEXAMETHASONE SODIUM PHOSPHATE 10 MG/ML IJ SOLN
10.0000 mg | Freq: Once | INTRAMUSCULAR | Status: AC
  Administered 2017-06-09: 10 mg
  Filled 2017-06-09: qty 1

## 2017-06-09 MED ORDER — MIDAZOLAM HCL 5 MG/5ML IJ SOLN
1.0000 mg | INTRAMUSCULAR | Status: DC | PRN
  Administered 2017-06-09: 1 mg via INTRAVENOUS
  Filled 2017-06-09: qty 5

## 2017-06-09 MED ORDER — LIDOCAINE HCL (PF) 1 % IJ SOLN
10.0000 mL | Freq: Once | INTRAMUSCULAR | Status: AC
  Administered 2017-06-09: 5 mL
  Filled 2017-06-09: qty 10

## 2017-06-09 MED ORDER — ROPIVACAINE HCL 2 MG/ML IJ SOLN
10.0000 mL | Freq: Once | INTRAMUSCULAR | Status: AC
  Administered 2017-06-09: 10 mL
  Filled 2017-06-09: qty 10

## 2017-06-09 NOTE — Progress Notes (Signed)
Patient's Name: Robin MoseLinda J Hollifield [name]  MRN: [id number]  Referring Provider: Lauro RegulusAnderson, Marshall W, MD  DOB: 01/13/1946  PCP: Lauro RegulusAnderson, Marshall W, MD  DOS: 06/09/2017  Note by: Edward JollyBilal Sequan Auxier, MD  Service setting: Ambulatory outpatient  Specialty: Interventional Pain Management  Patient type: Established  Location: [facility] (AMB) Pain Management Facility  Visit type: Interventional Procedure   Primary Reason for Visit: Interventional Pain Management Treatment. CC: Back Pain (lower)  Procedure:  Anesthesia, Analgesia, Anxiolysis:  Type: Therapeutic Medial Branch Facet Radiofrequency Ablation Region: Lumbar Level: L3, L4, L5, Medial Branch Level(s) Laterality: Left-Sided  Type: Local Anesthesia with Moderate (Conscious) Sedation Local Anesthetic: Lidocaine 1% Route: Intravenous (IV) IV Access: Secured Sedation: Meaningful verbal contact was maintained at all times during the procedure  Indication(s): Analgesia and Anxiety   Indications: 1. Lumbar spondylosis   2. Lumbar facet arthropathy   3. Lumbar degenerative disc disease    [name] has either failed to respond, was unable to tolerate, or simply did not get enough benefit from other more conservative therapies including, but not limited to: 1. Over-the-counter medications 2. Anti-inflammatory medications 3. Muscle relaxants 4. Membrane stabilizers 5. Opioids 6. Physical therapy 7. Modalities (Heat, ice, etc.) 8. Invasive techniques such as nerve blocks. [name] has attained more than 50% relief of the pain from a series of diagnostic injections conducted in separate occasions.  Pain Score: Pre-procedure: 5 /10 Post-procedure: 0-No pain/10  Pre-op Assessment:  [name] is a 71 y.o. (year old), female patient, seen today for interventional treatment. She  has a past surgical history that includes Reduction mammaplasty (Bilateral); Ankle reconstruction (Left); Brachioplasty;  Cataract extraction; Cholecystectomy; Soft Tissue Tumor Resection; Abdominal hysterectomy (07/12/2014); Joint replacement (Bilateral, 07/2014, 03/2015); nasal reconstrucion; Toe Surgery (Right); Shoulder open rotator cuff repair; and Colonoscopy with propofol (N/A, 12/07/2016). [name] has a current medication list which includes the following prescription(s): amantadine, amlodipine, vitamin c, aspirin ec, azelastine, b complex vitamins, baclofen, carvedilol, celecoxib, vitamin d3, donepezil, esomeprazole, folic acid, levothyroxine, losartan, melatonin, montelukast, multivitamin, onabotulinumtoxina, potassium chloride sa, tapentadol, torsemide, tramadol, trazodone, venlafaxine, and prednisone, and the following Facility-Administered Medications: midazolam. Her primarily concern today is the Back Pain (lower)  Initial Vital Signs: Blood pressure (!) 150/73, pulse 84, temperature 98.2 F (36.8 C), weight 287 lb (130.2 kg), SpO2 98 %. BMI: Estimated body mass index is 46.32 kg/m as calculated from the following:   Height as of 06/02/17: 5\' 6"  (1.676 m).   Weight as of this encounter: 287 lb (130.2 kg).  Risk Assessment: Allergies: Reviewed. She is allergic to codeine; hydromorphone; latex; oxycodone-acetaminophen; sulfa antibiotics; bupropion; gabapentin; hydrocodone; morphine and related; and procaine.  Allergy Precautions: None required Coagulopathies: Reviewed. None identified.  Blood-thinner therapy: None at this time Active Infection(s): Reviewed. None identified. [name] is afebrile  Site Confirmation: [name] was asked to confirm the procedure and laterality before marking the site Procedure checklist: Completed Consent: Before the procedure and under the influence of no sedative(s), amnesic(s), or anxiolytics, the patient was informed of the treatment options, risks and possible complications. To fulfill our ethical and legal obligations, as  recommended by the American Medical Association's Code of Ethics, I have informed the patient of my clinical impression; the nature and purpose of the treatment or procedure; the risks, benefits, and possible complications of the intervention; the alternatives, including doing nothing; the risk(s) and benefit(s) of the alternative treatment(s) or procedure(s); and the risk(s) and benefit(s) of doing nothing. The patient was  provided information about the general risks and possible complications associated with the procedure. These may include, but are not limited to: failure to achieve desired goals, infection, bleeding, organ or nerve damage, allergic reactions, paralysis, and death. In addition, the patient was informed of those risks and complications associated to Spine-related procedures, such as failure to decrease pain; infection (i.e.: Meningitis, epidural or intraspinal abscess); bleeding (i.e.: epidural hematoma, subarachnoid hemorrhage, or any other type of intraspinal or peri-dural bleeding); organ or nerve damage (i.e.: Any type of peripheral nerve, nerve root, or spinal cord injury) with subsequent damage to sensory, motor, and/or autonomic systems, resulting in permanent pain, numbness, and/or weakness of one or several areas of the body; allergic reactions; (i.e.: anaphylactic reaction); and/or death. Furthermore, the patient was informed of those risks and complications associated with the medications. These include, but are not limited to: allergic reactions (i.e.: anaphylactic or anaphylactoid reaction(s)); adrenal axis suppression; blood sugar elevation that in diabetics may result in ketoacidosis or comma; water retention that in patients with history of congestive heart failure may result in shortness of breath, pulmonary edema, and decompensation with resultant heart failure; weight gain; swelling or edema; medication-induced neural toxicity; particulate matter embolism and blood vessel  occlusion with resultant organ, and/or nervous system infarction; and/or aseptic necrosis of one or more joints. Finally, the patient was informed that Medicine is not an exact science; therefore, there is also the possibility of unforeseen or unpredictable risks and/or possible complications that may result in a catastrophic outcome. The patient indicated having understood very clearly. We have given the patient no guarantees and we have made no promises. Enough time was given to the patient to ask questions, all of which were answered to the patient's satisfaction. [name] has indicated that she wanted to continue with the procedure. Attestation: I, the ordering provider, attest that I have discussed with the patient the benefits, risks, side-effects, alternatives, likelihood of achieving goals, and potential problems during recovery for the procedure that I have provided informed consent. Date: 06/09/2017; Time: 8:10 AM  Pre-Procedure Preparation:  Monitoring: As per clinic protocol. Respiration, ETCO2, SpO2, BP, heart rate and rhythm monitor placed and checked for adequate function Safety Precautions: Patient was assessed for positional comfort and pressure points before starting the procedure. Time-out: I initiated and conducted the "Time-out" before starting the procedure, as per protocol. The patient was asked to participate by confirming the accuracy of the "Time Out" information. Verification of the correct person, site, and procedure were performed and confirmed by me, the nursing staff, and the patient. "Time-out" conducted as per Joint Commission's Universal Protocol (UP.01.01.01). "Time-out" Date & Time: 06/09/2017; 0843 hrs.  Description of Procedure Process:   Position: Prone Target Area: For Lumbar Facet blocks, the target is the groove formed by the junction of the transverse process and superior articular process. For the L5 dorsal ramus, the target is the notch  between superior articular process and sacral ala. Approach: Paraspinal approach. Area Prepped: Entire Posterior Lumbosacral Region Prepping solution: Hibiclens (4.0% Chlorhexidine gluconate solution) Safety Precautions: Aspiration looking for blood return was conducted prior to all injections. At no point did we inject any substances, as a needle was being advanced. No attempts were made at seeking any paresthesias. Safe injection practices and needle disposal techniques used. Medications properly checked for expiration dates. SDV (single dose vial) medications used. Description of the Procedure: Protocol guidelines were followed. The patient was placed in position over the procedure table. The target area was identified  and the area prepped in the usual manner. The skin and muscle were infiltrated with local anesthetic. Appropriate amount of time allowed to pass for local anesthetics to take effect. Radiofrequency needles were introduced to the target area using fluoroscopic guidance. Using the NeuroTherm NT1100 Radiofrequency Generator, sensory stimulation using 50 Hz was used to locate & identify the nerve, making sure that the needle was positioned such that there was no sensory stimulation below 0.3 V or above 0.7 V. Stimulation using 2 Hz was used to evaluate the motor component. Care was taken not to lesion any nerves that demonstrated motor stimulation of the lower extremities at an output of less than 2.5 times that of the sensory threshold, or a maximum of 2.0 V. Once satisfactory placement of the needles was achieved, the numbing solution was slowly injected after negative aspiration. After waiting for at least 2 minutes, the ablation was performed at 80 degrees C for 60 seconds, using regular Radiofrequency settings. Once the procedure was completed, the needles were then removed and the area cleansed, making sure to leave some of the prepping solution back to take advantage of its long term  bactericidal properties. Intra-operative Compliance: Compliant  Illustration of the posterior view of the lumbar spine and the posterior neural structures. Laminae of L2 through S1 are labeled. DPRL5, dorsal primary ramus of L5; DPRS1, dorsal primary ramus of S1; DPR3, dorsal primary ramus of L3; FJ, facet (zygapophyseal) joint L3-L4; I, inferior articular process of L4; LB1, lateral branch of dorsal primary ramus of L1; IAB, inferior articular branches from L3 medial branch (supplies L4-L5 facet joint); IBP, intermediate branch plexus; MB3, medial branch of dorsal primary ramus of L3; NR3, third lumbar nerve root; S, superior articular process of L5; SAB, superior articular branches from L4 (supplies L4-5 facet joint also); TP3, transverse process of L3.  Vitals:   06/09/17 0920 06/09/17 0926 06/09/17 0939 06/09/17 0948  BP: 140/61 134/69 (!) 159/72 (!) 151/63  Pulse: 75 80 74 76  Resp: 15 12 16 17   Temp:  98.2 F (36.8 C)    SpO2: 96% 99% 98% 98%  Weight:        Start Time: 0843 hrs. End Time: 0917 hrs. Materials & Medications:  Needle(s) Type: Teflon-coated, curved tip, Radiofrequency needle(s) Gauge: 22G Length: 10cm Medication(s): We administered midazolam, lidocaine (PF), ropivacaine (PF) 2 mg/mL (0.2%), and dexamethasone. Please see chart orders for dosing details. After ablation, each level injected with 0.33 cc of Decadron 10 mg/cc and then flushed with 0.2% ropivacaine. Imaging Guidance (Spinal):  Type of Imaging Technique: Fluoroscopy Guidance (Spinal) Indication(s): Assistance in needle guidance and placement for procedures requiring needle placement in or near specific anatomical locations not easily accessible without such assistance. Exposure Time: Please see nurses notes. Contrast: None used. Fluoroscopic Guidance: I was personally present during the use of fluoroscopy. "Tunnel Vision Technique" used to obtain the best possible view of the target area. Parallax error  corrected before commencing the procedure. "Direction-depth-direction" technique used to introduce the needle under continuous pulsed fluoroscopy. Once target was reached, antero-posterior, oblique, and lateral fluoroscopic projection used confirm needle placement in all planes. Images permanently stored in EMR. Interpretation: No contrast injected. I personally interpreted the imaging intraoperatively. Adequate needle placement confirmed in multiple planes. Permanent images saved into the patient's record.  Antibiotic Prophylaxis:  Indication(s): None identified Antibiotic given: None  Post-operative Assessment:  EBL: None Complications: No immediate post-treatment complications observed by team, or reported by patient. Note: The patient tolerated the entire  procedure well. A repeat set of vitals were taken after the procedure and the patient was kept under observation following institutional policy, for this type of procedure. Post-procedural neurological assessment was performed, showing return to baseline, prior to discharge. The patient was provided with post-procedure discharge instructions, including a section on how to identify potential problems. Should any problems arise concerning this procedure, the patient was given instructions to immediately contact us, at any time, without hesitation. In any case, we plan to contact the patient by telephone for a follow-up status report regarding this interventional procedure. Comments:  No additional relevant information. 5 out of 5 strength bilateral lower extremity: Plantar flexion, dorsiflexion, knee flexion, knee extension.  Plan of Care  Follow-up in 3 weeks for contralateral RFA with sedation.  Imaging Orders     DG C-Arm 1-60 Min-No Report  Procedure Orders     Radiofrequency,Lumbar  Medications ordered for procedure: Meds ordered this encounter  Medications  . midazolam (VERSED) 5 MG/5ML injection 1-2 mg    Make sure Flumazenil is  available in the pyxis when using this medication. If oversedation occurs, administer 0.2 mg IV over 15 sec. If after 45 sec no response, administer 0.2 mg again over 1 min; may repeat at 1 min intervals; not to exceed 4 doses (1 mg)  . lidocaine (PF) (XYLOCAINE) 1 % injection 10 mL  . ropivacaine (PF) 2 mg/mL (0.2%) (NAROPIN) injection 10 mL  . dexamethasone (DECADRON) injection 10 mg   Medications administered: We administered midazolam, lidocaine (PF), ropivacaine (PF) 2 mg/mL (0.2%), and dexamethasone.  See the medical record for exact dosing, route, and time of administration.  This SmartLink is deprecated. Use AVSMEDLIST instead to display the medication list for a patient. Disposition: Discharge home  Discharge Date & Time: 06/09/2017; 0949 hrs.   Physician-requested Follow-up: Return in about 3 weeks (around 06/30/2017) for Contra-lateral RFA. Future Appointments  Date Time Provider Department Center  06/30/2017  8:00 AM Edward JollyLateef, Tian Davison, MD [facility]   Primary Care Physician: Lauro RegulusAnderson, Marshall W, MD [facility] Note by: Edward JollyBilal Audrick Lamoureaux, MD Date: 06/09/2017; Time: 10:40 AM  Disclaimer:  Medicine is not an exact science. The only guarantee in medicine is that nothing is guaranteed. It is important to note that the decision to proceed with this intervention was based on the information collected from the patient. The Data and conclusions were drawn from the patient's questionnaire, the interview, and the physical examination. Because the information was provided in large part by the patient, it cannot be guaranteed that it has not been purposely or unconsciously manipulated. Every effort has been made to obtain as much relevant data as possible for this evaluation. It is important to note that the conclusions that lead to this procedure are derived in large part from the available data. Always take into account that the treatment will also be  dependent on availability of resources and existing treatment guidelines, considered by other Pain Management Practitioners as being common knowledge and practice, at the time of the intervention. For Medico-Legal purposes, it is also important to point out that variation in procedural techniques and pharmacological choices are the acceptable norm. The indications, contraindications, technique, and results of the above procedure should only be interpreted and judged by a Board-Certified Interventional Pain Specialist with extensive familiarity and expertise in the same exact procedure and technique.

## 2017-06-09 NOTE — Patient Instructions (Signed)
Post-procedure Information What to expect: Most procedures involve the use of a local anesthetic (numbing medicine), and a steroid (anti-inflammatory medicine).  The local anesthetics may cause temporary numbness and weakness of the legs or arms, depending on the location of the block. This numbness/weakness may last 4-6 hours, depending on the local anesthetic used. In rare instances, it can last up to 24 hours. While numb, you must be very careful not to injure the extremity.  After any procedure, you could expect the pain to get better within 15-20 minutes. This relief is temporary and may last 4-6 hours. Once the local anesthetics wears off, you could experience discomfort, possibly more than usual, for up to 10 (ten) days. In the case of radiofrequencies, it may last up to 6 weeks. Surgeries may take up to 8 weeks for the healing process. The discomfort is due to the irritation caused by needles going through skin and muscle. To minimize the discomfort, we recommend using ice the first day, and heat from then on. The ice should be applied for 15 minutes on, and 15 minutes off. Keep repeating this cycle until bedtime. Avoid applying the ice directly to the skin, to prevent frostbite. Heat should be used daily, until the pain improves (4-10 days). Be careful not to burn yourself.  Occasionally you may experience muscle spasms or cramps. These occur as a consequence of the irritation caused by the needle sticks to the muscle and the blood that will inevitably be lost into the surrounding muscle tissue. Blood tends to be very irritating to tissues, which tend to react by going into spasm. These spasms may start the same day of your procedure, but they may also take days to develop. This late onset type of spasm or cramp is usually caused by electrolyte imbalances triggered by the steroids, at the level of the kidney. Cramps and spasms tend to respond well to muscle relaxants, multivitamins (some are  triggered by the procedure, but may have their origins in vitamin deficiencies), and "Gatorade", or any sports drinks that can replenish any electrolyte imbalances. (If you are a diabetic, ask your pharmacist to get you a sugar-free brand.) Warm showers or baths may also be helpful. Stretching exercises are highly recommended. General Instructions:  Be alert for signs of possible infection: redness, swelling, heat, red streaks, elevated temperature, and/or fever. These typically appear 4 to 6 days after the procedure. Immediately notify your doctor if you experience unusual bleeding, difficulty breathing, or loss of bowel or bladder control. If you experience increased pain, do not increase your pain medicine intake, unless instructed by your pain physician. Post-Procedure Care:  Be careful in moving about. Muscle spasms in the area of the injection may occur. Applying ice or heat to the area is often helpful. The incidence of spinal headaches after epidural injections ranges between 1.4% and 6%. If you develop a headache that does not seem to respond to conservative therapy, please let your physician know. This can be treated with an epidural blood patch.   Post-procedure numbness or redness is to be expected, however it should average 4 to 6 hours. If numbness and weakness of your extremities begins to develop 4 to 6 hours after your procedure, and is felt to be progressing and worsening, immediately contact your physician.   Diet:  If you experience nausea, do not eat until this sensation goes away. If you had a "Stellate Ganglion Block" for upper extremity "Reflex Sympathetic Dystrophy", do not eat or drink until your   hoarseness goes away. In any case, always start with liquids first and if you tolerate them well, then slowly progress to more solid foods. Activity:  For the first 4 to 6 hours after the procedure, use caution in moving about as you may experience numbness and/or weakness. Use caution in  cooking, using household electrical appliances, and climbing steps. If you need to reach your Doctor call our office: [phone] Monday-Thursday 8:00 am - 4:00 PM    Fridays: Closed     In case of an emergency: In case of emergency, call 911 or go to the nearest emergency room and have the physician there call us.  Interpretation of Procedure Every nerve block has two components: a diagnostic component, and a treatment component. Unrealistic expectations are the most common causes of "perceived failure".  In a perfect world, a single nerve block should be able to completely and permanently eliminate the pain. Sadly, the world is not perfect.  Most pain management nerve blocks are performed using local anesthetics and steroids. Steroids are responsible for any long-term benefit that you may experience. Their purpose is to decrease any chronic swelling that may exist in the area. Steroids begin to work immediately after being injected. However, most patients will not experience any benefits until 5 to 10 days after the injection, when the swelling has come down to the point where they can tell a difference. Steroids will only help if there is swelling to be treated. As such, they can assist with the diagnosis. If effective, they suggest an inflammatory component to the pain, and if ineffective, they rule out inflammation as the main cause or component of the problem. If the problem is one of mechanical compression, you will get no benefit from those steroids.   In the case of local anesthetics, they have a crucial role in the diagnosis of your condition. Most will begin to work within15 to 20 minutes after injection. The duration will depend on the type used (short- vs. Long-acting). It is of outmost importance that patients keep tract of their pain, after the procedure. To assist with this matter, a "Post-procedure Pain Diary" is provided. Make sure to complete it and to bring it back to your  follow-up appointment.  As long as the patient keeps accurate, detailed records of their symptoms after every procedure, and returns to have those interpreted, every procedure will provide us with invaluable information. Even a block that does not provide the patient with any relief, will always provide us with information about the mechanism and the origin of the pain. The only time a nerve block can be considered a waste of time is when patients do not keep track of the results, or do not keep their post-procedure appointment.  Reporting the results back to your physician The Pain Score  Pain is a subjective complaint. It cannot be seen, touched, or measured. We depend entirely on the patient's report of the pain in order to assess your condition and treatment. To evaluate the pain, we use a pain scale, where "0" means "No Pain", and a "10" is "the worst possible pain that you can even imagine" (i.e. something like been eaten alive by a shark or being torn apart by a lion).   You will frequently be asked to rate your pain. Please be as accurate, remember that medical decisions will be based on your responses. Please do not rate your pain above a 10. Doing so is actually interpreted as "symptom magnification" (exaggeration), as   well as lack of understanding with regards to the scale. To put this into perspective, when you tell us that your pain is at a 10 (ten), what you are saying is that there is nothing we can do to make this pain any worse. (Carefully think about that.) Preparing for Procedure with Sedation Instructions: . Oral Intake: Do not eat or drink anything for at least 8 hours prior to your procedure. . Transportation: Public transportation is not allowed. Bring an adult driver. The driver must be physically present in our waiting room before any procedure can be started. . Physical Assistance: Bring an adult capable of physically assisting you, in the event you need help. . Blood Pressure  Medicine: Take your blood pressure medicine with a sip of water the morning of the procedure. . Insulin: Take only  of your normal insulin dose. . Preventing infections: Shower with an antibacterial soap the morning of your procedure. . Build-up your immune system: Take 1000 mg of Vitamin C with every meal (3 times a day) the day prior to your procedure. . Pregnancy: If you are pregnant, call and cancel the procedure. . Sickness: If you have a cold, fever, or any active infections, call and cancel the procedure. . Arrival: You must be in the facility at least 30 minutes prior to your scheduled procedure. . Children: Do not bring children with you. . Dress appropriately: Bring dark clothing that you would not mind if they get stained. . Valuables: Do not bring any jewelry or valuables. Procedure appointments are reserved for interventional treatments only. . No Prescription Refills. . No medication changes will be discussed during procedure appointments. . No disability issues will be discussed. .  

## 2017-06-10 ENCOUNTER — Telehealth: Payer: Self-pay

## 2017-06-10 NOTE — Telephone Encounter (Signed)
Denies any needs at this time. Instructed to call if needed. 

## 2017-06-30 ENCOUNTER — Ambulatory Visit
Admission: RE | Admit: 2017-06-30 | Discharge: 2017-06-30 | Disposition: A | Discharge status: Home / Self Care | Payer: Medicare Other | Source: Ambulatory Visit | Attending: Student in an Organized Health Care Education/Training Program | Admitting: Student in an Organized Health Care Education/Training Program

## 2017-06-30 ENCOUNTER — Ambulatory Visit (HOSPITAL_BASED_OUTPATIENT_CLINIC_OR_DEPARTMENT_OTHER): Payer: Medicare Other | Admitting: Student in an Organized Health Care Education/Training Program

## 2017-06-30 ENCOUNTER — Encounter: Payer: Self-pay | Admitting: Student in an Organized Health Care Education/Training Program

## 2017-06-30 VITALS — BP 135/72 | HR 82 | Temp 97.9°F | Resp 16 | Ht 66.0 in | Wt 287.0 lb

## 2017-06-30 DIAGNOSIS — Z885 Allergy status to narcotic agent status: Secondary | ICD-10-CM | POA: Insufficient documentation

## 2017-06-30 DIAGNOSIS — G8929 Other chronic pain: Secondary | ICD-10-CM | POA: Insufficient documentation

## 2017-06-30 DIAGNOSIS — M47896 Other spondylosis, lumbar region: Secondary | ICD-10-CM | POA: Diagnosis not present

## 2017-06-30 DIAGNOSIS — M5136 Other intervertebral disc degeneration, lumbar region: Secondary | ICD-10-CM | POA: Insufficient documentation

## 2017-06-30 DIAGNOSIS — M47816 Spondylosis without myelopathy or radiculopathy, lumbar region: Secondary | ICD-10-CM | POA: Diagnosis not present

## 2017-06-30 DIAGNOSIS — Z91040 Latex allergy status: Secondary | ICD-10-CM | POA: Diagnosis not present

## 2017-06-30 DIAGNOSIS — M51369 Other intervertebral disc degeneration, lumbar region without mention of lumbar back pain or lower extremity pain: Secondary | ICD-10-CM

## 2017-06-30 DIAGNOSIS — Z888 Allergy status to other drugs, medicaments and biological substances status: Secondary | ICD-10-CM | POA: Insufficient documentation

## 2017-06-30 DIAGNOSIS — Z882 Allergy status to sulfonamides status: Secondary | ICD-10-CM | POA: Insufficient documentation

## 2017-06-30 MED ORDER — DEXAMETHASONE SODIUM PHOSPHATE 10 MG/ML IJ SOLN
10.0000 mg | Freq: Once | INTRAMUSCULAR | Status: AC
  Administered 2017-06-30: 10 mg
  Filled 2017-06-30: qty 1

## 2017-06-30 MED ORDER — FENTANYL CITRATE (PF) 100 MCG/2ML IJ SOLN
25.0000 ug | INTRAMUSCULAR | Status: DC | PRN
  Administered 2017-06-30: 25 ug via INTRAVENOUS
  Filled 2017-06-30: qty 2

## 2017-06-30 MED ORDER — LACTATED RINGERS IV SOLN
1000.0000 mL | Freq: Once | INTRAVENOUS | Status: AC
  Administered 2017-06-30: 1000 mL via INTRAVENOUS

## 2017-06-30 MED ORDER — ROPIVACAINE HCL 2 MG/ML IJ SOLN
10.0000 mL | Freq: Once | INTRAMUSCULAR | Status: AC
  Administered 2017-06-30: 10 mL
  Filled 2017-06-30: qty 10

## 2017-06-30 MED ORDER — LIDOCAINE HCL (PF) 1 % IJ SOLN
10.0000 mL | Freq: Once | INTRAMUSCULAR | Status: AC
  Administered 2017-06-30: 5 mL
  Filled 2017-06-30: qty 10

## 2017-06-30 NOTE — Progress Notes (Signed)
Patient's Name: [name]  MRN: [id number]  Referring Provider: Lauro Regulus, MD  DOB: 1945/11/26  PCP: Lauro Regulus, MD  DOS: 06/30/2017  Note by: Edward Jolly, MD  Service setting: Ambulatory outpatient  Specialty: Interventional Pain Management  Patient type: Established  Location: [facility] (AMB) Pain Management Facility  Visit type: Interventional Procedure   Primary Reason for Visit: Interventional Pain Management Treatment. CC: Back Pain (lower right)  Procedure:  Anesthesia, Analgesia, Anxiolysis:  Type: Therapeutic Medial Branch Facet Radiofrequency Ablation Region: Lumbar Level: L3, L4, L5, Medial Branch Level(s) Laterality: Right-Sided  Type: Local Anesthesia with Moderate (Conscious) Sedation Local Anesthetic: Lidocaine 1% Route: Intravenous (IV) IV Access: Secured Sedation: Meaningful verbal contact was maintained at all times during the procedure  Indication(s): Analgesia and Anxiety   Indications: 1. Lumbar spondylosis   2. Lumbar facet arthropathy   3. Lumbar degenerative disc disease    [name] has either failed to respond, was unable to tolerate, or simply did not get enough benefit from other more conservative therapies including, but not limited to: 1. Over-the-counter medications 2. Anti-inflammatory medications 3. Muscle relaxants 4. Membrane stabilizers 5. Opioids 6. Physical therapy 7. Modalities (Heat, ice, etc.) 8. Invasive techniques such as nerve blocks. [name] has attained more than 50% relief of the pain from a series of diagnostic injections conducted in separate occasions.  Pain Score: Pre-procedure: 2 /10 Post-procedure: 0-No pain/10  Pre-op Assessment:  [name] is a 72 y.o. (year old), female patient, seen today for interventional treatment. She  has a past surgical history that includes Reduction mammaplasty (Bilateral); Ankle reconstruction (Left);  Brachioplasty; Cataract extraction; Cholecystectomy; Soft Tissue Tumor Resection; Abdominal hysterectomy (07/12/2014); Joint replacement (Bilateral, 07/2014, 03/2015); nasal reconstrucion; Toe Surgery (Right); Shoulder open rotator cuff repair; and Colonoscopy with propofol (N/A, 12/07/2016). [name] has a current medication list which includes the following prescription(s): amantadine, amlodipine, vitamin c, aspirin ec, azelastine, b complex vitamins, baclofen, carvedilol, celecoxib, vitamin d3, donepezil, esomeprazole, folic acid, levothyroxine, losartan, melatonin, montelukast, multivitamin, onabotulinumtoxina, potassium chloride sa, tapentadol, torsemide, tramadol, trazodone, venlafaxine, and prednisone, and the following Facility-Administered Medications: fentanyl. Her primarily concern today is the Back Pain (lower right)  Initial Vital Signs: Blood pressure (!) 150/73, pulse 84, temperature 98.2 F (36.8 C), weight 287 lb (130.2 kg), SpO2 98 %. BMI: Estimated body mass index is 46.32 kg/m as calculated from the following:   Height as of this encounter: 5\' 6"  (1.676 m).   Weight as of this encounter: 287 lb (130.2 kg).  Risk Assessment: Allergies: Reviewed. She is allergic to codeine; hydromorphone; latex; oxycodone-acetaminophen; sulfa antibiotics; bupropion; gabapentin; hydrocodone; morphine and related; and procaine.  Allergy Precautions: None required Coagulopathies: Reviewed. None identified.  Blood-thinner therapy: None at this time Active Infection(s): Reviewed. None identified. [name] is afebrile  Site Confirmation: [name] was asked to confirm the procedure and laterality before marking the site Procedure checklist: Completed Consent: Before the procedure and under the influence of no sedative(s), amnesic(s), or anxiolytics, the patient was informed of the treatment options, risks and possible complications. To fulfill our ethical  and legal obligations, as recommended by the American Medical Association's Code of Ethics, I have informed the patient of my clinical impression; the nature and purpose of the treatment or procedure; the risks, benefits, and possible complications of the intervention; the alternatives, including doing nothing; the risk(s) and benefit(s) of the alternative treatment(s) or procedure(s); and the risk(s) and benefit(s) of doing nothing.  The patient was provided information about the general risks and possible complications associated with the procedure. These may include, but are not limited to: failure to achieve desired goals, infection, bleeding, organ or nerve damage, allergic reactions, paralysis, and death. In addition, the patient was informed of those risks and complications associated to Spine-related procedures, such as failure to decrease pain; infection (i.e.: Meningitis, epidural or intraspinal abscess); bleeding (i.e.: epidural hematoma, subarachnoid hemorrhage, or any other type of intraspinal or peri-dural bleeding); organ or nerve damage (i.e.: Any type of peripheral nerve, nerve root, or spinal cord injury) with subsequent damage to sensory, motor, and/or autonomic systems, resulting in permanent pain, numbness, and/or weakness of one or several areas of the body; allergic reactions; (i.e.: anaphylactic reaction); and/or death. Furthermore, the patient was informed of those risks and complications associated with the medications. These include, but are not limited to: allergic reactions (i.e.: anaphylactic or anaphylactoid reaction(s)); adrenal axis suppression; blood sugar elevation that in diabetics may result in ketoacidosis or comma; water retention that in patients with history of congestive heart failure may result in shortness of breath, pulmonary edema, and decompensation with resultant heart failure; weight gain; swelling or edema; medication-induced neural toxicity; particulate matter  embolism and blood vessel occlusion with resultant organ, and/or nervous system infarction; and/or aseptic necrosis of one or more joints. Finally, the patient was informed that Medicine is not an exact science; therefore, there is also the possibility of unforeseen or unpredictable risks and/or possible complications that may result in a catastrophic outcome. The patient indicated having understood very clearly. We have given the patient no guarantees and we have made no promises. Enough time was given to the patient to ask questions, all of which were answered to the patient's satisfaction. [name] has indicated that she wanted to continue with the procedure. Attestation: I, the ordering provider, attest that I have discussed with the patient the benefits, risks, side-effects, alternatives, likelihood of achieving goals, and potential problems during recovery for the procedure that I have provided informed consent. Date: 06/30/2017; Time: 8:10 AM  Pre-Procedure Preparation:  Monitoring: As per clinic protocol. Respiration, ETCO2, SpO2, BP, heart rate and rhythm monitor placed and checked for adequate function Safety Precautions: Patient was assessed for positional comfort and pressure points before starting the procedure. Time-out: I initiated and conducted the "Time-out" before starting the procedure, as per protocol. The patient was asked to participate by confirming the accuracy of the "Time Out" information. Verification of the correct person, site, and procedure were performed and confirmed by me, the nursing staff, and the patient. "Time-out" conducted as per Joint Commission's Universal Protocol (UP.01.01.01). "Time-out" Date & Time: 06/30/2017; 0850 hrs.  Description of Procedure Process:   Position: Prone Target Area: For Lumbar Facet blocks, the target is the groove formed by the junction of the transverse process and superior articular process. For the L5 dorsal ramus, the  target is the notch between superior articular process and sacral ala. Approach: Paraspinal approach. Area Prepped: Entire Posterior Lumbosacral Region Prepping solution: Hibiclens (4.0% Chlorhexidine gluconate solution) Safety Precautions: Aspiration looking for blood return was conducted prior to all injections. At no point did we inject any substances, as a needle was being advanced. No attempts were made at seeking any paresthesias. Safe injection practices and needle disposal techniques used. Medications properly checked for expiration dates. SDV (single dose vial) medications used. Description of the Procedure: Protocol guidelines were followed. The patient was placed in position over the procedure table. The target  area was identified and the area prepped in the usual manner. The skin and muscle were infiltrated with local anesthetic. Appropriate amount of time allowed to pass for local anesthetics to take effect. Radiofrequency needles were introduced to the target area using fluoroscopic guidance. Using the NeuroTherm NT1100 Radiofrequency Generator, sensory stimulation using 50 Hz was used to locate & identify the nerve, making sure that the needle was positioned such that there was no sensory stimulation below 0.3 V or above 0.7 V. Stimulation using 2 Hz was used to evaluate the motor component. Care was taken not to lesion any nerves that demonstrated motor stimulation of the lower extremities at an output of less than 2.5 times that of the sensory threshold, or a maximum of 2.0 V. Once satisfactory placement of the needles was achieved, the numbing solution was slowly injected after negative aspiration. After waiting for at least 2 minutes, the ablation was performed at 80 degrees C for 60 seconds, using regular Radiofrequency settings. Once the procedure was completed, the needles were then removed and the area cleansed, making sure to leave some of the prepping solution back to take advantage of  its long term bactericidal properties. Intra-operative Compliance: Compliant  Illustration of the posterior view of the lumbar spine and the posterior neural structures. Laminae of L2 through S1 are labeled. DPRL5, dorsal primary ramus of L5; DPRS1, dorsal primary ramus of S1; DPR3, dorsal primary ramus of L3; FJ, facet (zygapophyseal) joint L3-L4; I, inferior articular process of L4; LB1, lateral branch of dorsal primary ramus of L1; IAB, inferior articular branches from L3 medial branch (supplies L4-L5 facet joint); IBP, intermediate branch plexus; MB3, medial branch of dorsal primary ramus of L3; NR3, third lumbar nerve root; S, superior articular process of L5; SAB, superior articular branches from L4 (supplies L4-5 facet joint also); TP3, transverse process of L3.  Vitals:   06/30/17 0919 06/30/17 0929 06/30/17 0939 06/30/17 0949  BP: 138/85 (!) 151/84 125/65 135/72  Pulse:      Resp: 15 16 16 16   Temp:  97.8 F (36.6 C)  97.9 F (36.6 C)  TempSrc:      SpO2: 100% 98% 98% 96%  Weight:      Height:        Start Time: 0851 hrs. End Time: 0918 hrs. Materials & Medications:  Needle(s) Type: Teflon-coated, curved tip, Radiofrequency needle(s) Gauge: 22G Length: 10cm Medication(s): We administered lactated ringers, fentaNYL, lidocaine (PF), ropivacaine (PF) 2 mg/mL (0.2%), and dexamethasone. Please see chart orders for dosing details. After ablation, each level injected with 0.33 cc of Decadron 10 mg/cc and then flushed with 0.2% ropivacaine. Imaging Guidance (Spinal):  Type of Imaging Technique: Fluoroscopy Guidance (Spinal) Indication(s): Assistance in needle guidance and placement for procedures requiring needle placement in or near specific anatomical locations not easily accessible without such assistance. Exposure Time: Please see nurses notes. Contrast: None used. Fluoroscopic Guidance: I was personally present during the use of fluoroscopy. "Tunnel Vision Technique" used to  obtain the best possible view of the target area. Parallax error corrected before commencing the procedure. "Direction-depth-direction" technique used to introduce the needle under continuous pulsed fluoroscopy. Once target was reached, antero-posterior, oblique, and lateral fluoroscopic projection used confirm needle placement in all planes. Images permanently stored in EMR. Interpretation: No contrast injected. I personally interpreted the imaging intraoperatively. Adequate needle placement confirmed in multiple planes. Permanent images saved into the patient's record.  Antibiotic Prophylaxis:  Indication(s): None identified Antibiotic given: None  Post-operative Assessment:  EBL:  None Complications: No immediate post-treatment complications observed by team, or reported by patient. Note: The patient tolerated the entire procedure well. A repeat set of vitals were taken after the procedure and the patient was kept under observation following institutional policy, for this type of procedure. Post-procedural neurological assessment was performed, showing return to baseline, prior to discharge. The patient was provided with post-procedure discharge instructions, including a section on how to identify potential problems. Should any problems arise concerning this procedure, the patient was given instructions to immediately contact us, at any time, without hesitation. In any case, we plan to contact the patient by telephone for a follow-up status report regarding this interventional procedure. Comments:  No additional relevant information. 5 out of 5 strength bilateral lower extremity: Plantar flexion, dorsiflexion, knee flexion, knee extension.  Plan of Care    Imaging Orders     DG C-Arm 1-60 Min-No Report  Medications ordered for procedure: Meds ordered this encounter  Medications  . lactated ringers infusion 1,000 mL  . fentaNYL (SUBLIMAZE) injection 25-100 mcg    Make sure Narcan is  available in the pyxis when using this medication. In the event of respiratory depression (RR< 8/min): Titrate NARCAN (naloxone) in increments of 0.1 to 0.2 mg IV at 2-3 minute intervals, until desired degree of reversal.  . lidocaine (PF) (XYLOCAINE) 1 % injection 10 mL  . ropivacaine (PF) 2 mg/mL (0.2%) (NAROPIN) injection 10 mL  . dexamethasone (DECADRON) injection 10 mg   Medications administered: We administered lactated ringers, fentaNYL, lidocaine (PF), ropivacaine (PF) 2 mg/mL (0.2%), and dexamethasone.  See the medical record for exact dosing, route, and time of administration.  New Prescriptions   No medications on file   Disposition: Discharge home  Discharge Date & Time: 06/30/2017; 0951 hrs.   Physician-requested Follow-up: Return in about 6 weeks (around 08/11/2017) for Post Procedure Evaluation. Future Appointments  Date Time Provider Department Center  08/12/2017  8:45 AM Edward JollyLateef, Dareion Kneece, MD [facility]   Primary Care Physician: Lauro RegulusAnderson, Marshall W, MD [facility] Note by: Edward JollyBilal Jacklyne Baik, MD Date: 06/30/2017; Time: 10:02 AM  Disclaimer:  Medicine is not an exact science. The only guarantee in medicine is that nothing is guaranteed. It is important to note that the decision to proceed with this intervention was based on the information collected from the patient. The Data and conclusions were drawn from the patient's questionnaire, the interview, and the physical examination. Because the information was provided in large part by the patient, it cannot be guaranteed that it has not been purposely or unconsciously manipulated. Every effort has been made to obtain as much relevant data as possible for this evaluation. It is important to note that the conclusions that lead to this procedure are derived in large part from the available data. Always take into account that the treatment will also be dependent on availability of resources and  existing treatment guidelines, considered by other Pain Management Practitioners as being common knowledge and practice, at the time of the intervention. For Medico-Legal purposes, it is also important to point out that variation in procedural techniques and pharmacological choices are the acceptable norm. The indications, contraindications, technique, and results of the above procedure should only be interpreted and judged by a Board-Certified Interventional Pain Specialist with extensive familiarity and expertise in the same exact procedure and technique.

## 2017-06-30 NOTE — Patient Instructions (Signed)
GENERAL RISKS AND COMPLICATIONS  What are the risk, side effects and possible complications? Generally speaking, most procedures are safe.  However, with any procedure there are risks, side effects, and the possibility of complications.  The risks and complications are dependent upon the sites that are lesioned, or the type of nerve block to be performed.  The closer the procedure is to the spine, the more serious the risks are.  Great care is taken when placing the radio frequency needles, block needles or lesioning probes, but sometimes complications can occur. 1. Infection: Any time there is an injection through the skin, there is a risk of infection.  This is why sterile conditions are used for these blocks.  There are four possible types of infection. 1. Localized skin infection. 2. Central Nervous System Infection-This can be in the form of Meningitis, which can be deadly. 3. Epidural Infections-This can be in the form of an epidural abscess, which can cause pressure inside of the spine, causing compression of the spinal cord with subsequent paralysis. This would require an emergency surgery to decompress, and there are no guarantees that the patient would recover from the paralysis. 4. Discitis-This is an infection of the intervertebral discs.  It occurs in about 1% of discography procedures.  It is difficult to treat and it may lead to surgery.        2. Pain: the needles have to go through skin and soft tissues, will cause soreness.       3. Damage to internal structures:  The nerves to be lesioned may be near blood vessels or    other nerves which can be potentially damaged.       4. Bleeding: Bleeding is more common if the patient is taking blood thinners such as  aspirin, Coumadin, Ticiid, Plavix, etc., or if he/she have some genetic predisposition  such as hemophilia. Bleeding into the spinal canal can cause compression of the spinal  cord with subsequent paralysis.  This would require an  emergency surgery to  decompress and there are no guarantees that the patient would recover from the  paralysis.       5. Pneumothorax:  Puncturing of a lung is a possibility, every time a needle is introduced in  the area of the chest or upper back.  Pneumothorax refers to free air around the  collapsed lung(s), inside of the thoracic cavity (chest cavity).  Another two possible  complications related to a similar event would include: Hemothorax and Chylothorax.   These are variations of the Pneumothorax, where instead of air around the collapsed  lung(s), you may have blood or chyle, respectively.       6. Spinal headaches: They may occur with any procedures in the area of the spine.       7. Persistent CSF (Cerebro-Spinal Fluid) leakage: This is a rare problem, but may occur  with prolonged intrathecal or epidural catheters either due to the formation of a fistulous  track or a dural tear.       8. Nerve damage: By working so close to the spinal cord, there is always a possibility of  nerve damage, which could be as serious as a permanent spinal cord injury with  paralysis.       9. Death:  Although rare, severe deadly allergic reactions known as "Anaphylactic  reaction" can occur to any of the medications used.      10. Worsening of the symptoms:  We can always make thing worse.    What are the chances of something like this happening? Chances of any of this occuring are extremely low.  By statistics, you have more of a chance of getting killed in a motor vehicle accident: while driving to the hospital than any of the above occurring .  Nevertheless, you should be aware that they are possibilities.  In general, it is similar to taking a shower.  Everybody knows that you can slip, hit your head and get killed.  Does that mean that you should not shower again?  Nevertheless always keep in mind that statistics do not mean anything if you happen to be on the wrong side of them.  Even if a procedure has a 1  (one) in a 1,000,000 (million) chance of going wrong, it you happen to be that one..Also, keep in mind that by statistics, you have more of a chance of having something go wrong when taking medications.  Who should not have this procedure? If you are on a blood thinning medication (e.g. Coumadin, Plavix, see list of "Blood Thinners"), or if you have an active infection going on, you should not have the procedure.  If you are taking any blood thinners, please inform your physician.  How should I prepare for this procedure?  Do not eat or drink anything at least six hours prior to the procedure.  Bring a driver with you .  It cannot be a taxi.  Come accompanied by an adult that can drive you back, and that is strong enough to help you if your legs get weak or numb from the local anesthetic.  Take all of your medicines the morning of the procedure with just enough water to swallow them.  If you have diabetes, make sure that you are scheduled to have your procedure done first thing in the morning, whenever possible.  If you have diabetes, take only half of your insulin dose and notify our nurse that you have done so as soon as you arrive at the clinic.  If you are diabetic, but only take blood sugar pills (oral hypoglycemic), then do not take them on the morning of your procedure.  You may take them after you have had the procedure.  Do not take aspirin or any aspirin-containing medications, at least eleven (11) days prior to the procedure.  They may prolong bleeding.  Wear loose fitting clothing that may be easy to take off and that you would not mind if it got stained with Betadine or blood.  Do not wear any jewelry or perfume  Remove any nail coloring.  It will interfere with some of our monitoring equipment.  NOTE: Remember that this is not meant to be interpreted as a complete list of all possible complications.  Unforeseen problems may occur.  BLOOD THINNERS The following drugs  contain aspirin or other products, which can cause increased bleeding during surgery and should not be taken for 2 weeks prior to and 1 week after surgery.  If you should need take something for relief of minor pain, you may take acetaminophen which is found in Tylenol,m Datril, Anacin-3 and Panadol. It is not blood thinner. The products listed below are.  Do not take any of the products listed below in addition to any listed on your instruction sheet.  A.P.C or A.P.C with Codeine Codeine Phosphate Capsules #3 Ibuprofen Ridaura  ABC compound Congesprin Imuran rimadil  Advil Cope Indocin Robaxisal  Alka-Seltzer Effervescent Pain Reliever and Antacid Coricidin or Coricidin-D  Indomethacin Rufen    Alka-Seltzer plus Cold Medicine Cosprin Ketoprofen S-A-C Tablets  Anacin Analgesic Tablets or Capsules Coumadin Korlgesic Salflex  Anacin Extra Strength Analgesic tablets or capsules CP-2 Tablets Lanoril Salicylate  Anaprox Cuprimine Capsules Levenox Salocol  Anexsia-D Dalteparin Magan Salsalate  Anodynos Darvon compound Magnesium Salicylate Sine-off  Ansaid Dasin Capsules Magsal Sodium Salicylate  Anturane Depen Capsules Marnal Soma  APF Arthritis pain formula Dewitt's Pills Measurin Stanback  Argesic Dia-Gesic Meclofenamic Sulfinpyrazone  Arthritis Bayer Timed Release Aspirin Diclofenac Meclomen Sulindac  Arthritis pain formula Anacin Dicumarol Medipren Supac  Analgesic (Safety coated) Arthralgen Diffunasal Mefanamic Suprofen  Arthritis Strength Bufferin Dihydrocodeine Mepro Compound Suprol  Arthropan liquid Dopirydamole Methcarbomol with Aspirin Synalgos  ASA tablets/Enseals Disalcid Micrainin Tagament  Ascriptin Doan's Midol Talwin  Ascriptin A/D Dolene Mobidin Tanderil  Ascriptin Extra Strength Dolobid Moblgesic Ticlid  Ascriptin with Codeine Doloprin or Doloprin with Codeine Momentum Tolectin  Asperbuf Duoprin Mono-gesic Trendar  Aspergum Duradyne Motrin or Motrin IB Triminicin  Aspirin  plain, buffered or enteric coated Durasal Myochrisine Trigesic  Aspirin Suppositories Easprin Nalfon Trillsate  Aspirin with Codeine Ecotrin Regular or Extra Strength Naprosyn Uracel  Atromid-S Efficin Naproxen Ursinus  Auranofin Capsules Elmiron Neocylate Vanquish  Axotal Emagrin Norgesic Verin  Azathioprine Empirin or Empirin with Codeine Normiflo Vitamin E  Azolid Emprazil Nuprin Voltaren  Bayer Aspirin plain, buffered or children's or timed BC Tablets or powders Encaprin Orgaran Warfarin Sodium  Buff-a-Comp Enoxaparin Orudis Zorpin  Buff-a-Comp with Codeine Equegesic Os-Cal-Gesic   Buffaprin Excedrin plain, buffered or Extra Strength Oxalid   Bufferin Arthritis Strength Feldene Oxphenbutazone   Bufferin plain or Extra Strength Feldene Capsules Oxycodone with Aspirin   Bufferin with Codeine Fenoprofen Fenoprofen Pabalate or Pabalate-SF   Buffets II Flogesic Panagesic   Buffinol plain or Extra Strength Florinal or Florinal with Codeine Panwarfarin   Buf-Tabs Flurbiprofen Penicillamine   Butalbital Compound Four-way cold tablets Penicillin   Butazolidin Fragmin Pepto-Bismol   Carbenicillin Geminisyn Percodan   Carna Arthritis Reliever Geopen Persantine   Carprofen Gold's salt Persistin   Chloramphenicol Goody's Phenylbutazone   Chloromycetin Haltrain Piroxlcam   Clmetidine heparin Plaquenil   Cllnoril Hyco-pap Ponstel   Clofibrate Hydroxy chloroquine Propoxyphen         Before stopping any of these medications, be sure to consult the physician who ordered them.  Some, such as Coumadin (Warfarin) are ordered to prevent or treat serious conditions such as "deep thrombosis", "pumonary embolisms", and other heart problems.  The amount of time that you may need off of the medication may also vary with the medication and the reason for which you were taking it.  If you are taking any of these medications, please make sure you notify your pain physician before you undergo any  procedures.         Post RF Discharge Instructions  You have just completed Radiofrequency Neurotomy.  The following instructions will provide you with information and guidelines for self-care upon discharge.  If at any time you have questions or concerns please call your physician.  General Instructions:  Be alert for signs of possible infection: redness, swelling, heat, red streaks, elevated temperature, fever.  Please notify your doctor immediately if you have unusual bleeding, trouble breathing, or loss of the ability to control your bowel or bladder.  What to expect:  Most procedures involve the use of local anesthetics (numbing medicine), steroids (anti-inflammatory medicines) and possibly sedation (relaxation or nerve medicine).  Sedation may affect your memory, not allowing you to remember the procedure, or the instructions   that we give you after it.  Because of this, you doctor may want to avoid providing you important information after the procedure, since you may not remember.  The doctor will be more than happy to go over the information upon your return.  Local anesthetics, on the other hand, may cause temporary numbness and weakness of the legs or arms, depending on the location of the block.  This numbness/weaknes may last 4-6 hours ( the duration of the local anesthetic).  During this period of numbness, you must be more careful than usual to prevent any injuries to th extremity.  Steroids will begin to work immediately after injected, but on the average, it will take 6-10 days for the swelling to come down to the point where you will be able to tell a difference in terms of the pain.  In summary, you should expect for your pain to get better within 15-20 minutes after the procedure.  This relieve or numbness should last 4-6 hours, after which, it will wear off.  Once it wears off, you may experience more pain than usual for 4-6 days, or until the steroids "kick in".  This  discomfort is due to the procedure itself.  To minimize this, we recommend applying ice (fill a plastic sandwich bag with ice and wrap it on a towel to prevent frostbite) to the area, 15 minutes on and 15 minutes off, the day of the procedure.  This will minimize any swelling.  Starting the next day, you should then start with heat (moist or dry, it does not matter).  Heat therapy should continue until the pain improves (4-6 days).  Be careful not to burn yourself.  In the case of Radiofrequency procedures, you should expect more pain than usual for 5 to 6 weeks after the procedure.  This is how long it takes the burned tissue to heal.  We cannot assess any definite results on the success of the procedure until this recovery period has elapsed.  Post-Procedure Care:  You will want to be careful in moving about.  Muscle spasms in the area of the injection may occur.  Use ice or heat to the area is often helpful.   Occasionally, a spinal headache can develop.  This is different from a normal headache in the sense that it will not go away on it's own, despite normal measures.  If you develop a headache that does not seem to respond to conservative therapy, please let your physician know.  This can be treated with an epidural blood patch.   You may experience some numbness or redness,m however it should be short lived.  If persistent numbness occurs, contact your physician. (Persistent numbness would be defined as lasting more than 4-6 hours).  Use care in moving the effected arm or leg to avoid further injury.  You may be given a sling or crutches to use temporarily.  Some discoloration may be present in your arms or leg for 24-48 hours.  If you experience shortness of breath or if your lips or fingers develop a dusky or "blue" appearance, contact your physician or go to the emergency room.  Diet  No eating limitations, unless stipulated above.  Nevertheless, if you have had sedation, you may experience some  nausea.  In this case, it may be wise to wait at least two hours prior to resuming regular diet.  Comfort  You may experience a temporary increase in pain.  You will also experience tenderness at the   site of injection, which may be accompanied with muscle spasms.  Use of cold or heat is usually helpful.  Take your prescription as directed.  Always exercise caution when taking pain pills.  Activity:  For the first 24 hours after the procedure, do not drive a motor vehicle,  Operate heavy machinery or power tools or handle any weapons.  Consider walking with the use of an assistive device or accompanied by an adult for the next 24 hours.  Do not drink alcoholic beverages including beer.  Do not make any important decisions or sign any legal documents. Go home and rest today.  Resume activities tomorrow, as tolerated.  Use caution in moving about as you may experience mild leg weakness.  Use caution in cooking, use of household electrical appliances and climbing steps.  Medications  May resume pre-procedure medications.  Do not take any drugs, other than what has been prescribed to you.   Other  

## 2017-06-30 NOTE — Progress Notes (Signed)
Safety precautions to be maintained throughout the outpatient stay will include: orient to surroundings, keep bed in low position, maintain call bell within reach at all times, provide assistance with transfer out of bed and ambulation.  

## 2017-07-01 ENCOUNTER — Telehealth: Payer: Self-pay | Admitting: *Deleted

## 2017-07-01 NOTE — Telephone Encounter (Signed)
Attempted to reach patient, voicemail left.  

## 2017-07-01 NOTE — Telephone Encounter (Signed)
Attempted to call for post procedure follow-up. Message left. 

## 2017-07-09 DIAGNOSIS — L708 Other acne: Secondary | ICD-10-CM | POA: Insufficient documentation

## 2017-08-12 ENCOUNTER — Ambulatory Visit
Payer: Medicare Other | Attending: Student in an Organized Health Care Education/Training Program | Admitting: Student in an Organized Health Care Education/Training Program

## 2017-08-12 ENCOUNTER — Other Ambulatory Visit: Payer: Self-pay

## 2017-08-12 ENCOUNTER — Encounter: Payer: Self-pay | Admitting: Student in an Organized Health Care Education/Training Program

## 2017-08-12 VITALS — BP 150/62 | Temp 98.0°F | Resp 18 | Ht 65.0 in | Wt 289.0 lb

## 2017-08-12 DIAGNOSIS — E876 Hypokalemia: Secondary | ICD-10-CM | POA: Diagnosis not present

## 2017-08-12 DIAGNOSIS — M545 Low back pain: Secondary | ICD-10-CM | POA: Insufficient documentation

## 2017-08-12 DIAGNOSIS — Z888 Allergy status to other drugs, medicaments and biological substances status: Secondary | ICD-10-CM | POA: Insufficient documentation

## 2017-08-12 DIAGNOSIS — M47816 Spondylosis without myelopathy or radiculopathy, lumbar region: Secondary | ICD-10-CM

## 2017-08-12 DIAGNOSIS — Z7982 Long term (current) use of aspirin: Secondary | ICD-10-CM | POA: Diagnosis not present

## 2017-08-12 DIAGNOSIS — E039 Hypothyroidism, unspecified: Secondary | ICD-10-CM | POA: Diagnosis not present

## 2017-08-12 DIAGNOSIS — M5136 Other intervertebral disc degeneration, lumbar region: Secondary | ICD-10-CM | POA: Diagnosis not present

## 2017-08-12 DIAGNOSIS — M47896 Other spondylosis, lumbar region: Secondary | ICD-10-CM | POA: Insufficient documentation

## 2017-08-12 DIAGNOSIS — F329 Major depressive disorder, single episode, unspecified: Secondary | ICD-10-CM | POA: Insufficient documentation

## 2017-08-12 DIAGNOSIS — I10 Essential (primary) hypertension: Secondary | ICD-10-CM | POA: Diagnosis not present

## 2017-08-12 DIAGNOSIS — K219 Gastro-esophageal reflux disease without esophagitis: Secondary | ICD-10-CM | POA: Diagnosis not present

## 2017-08-12 DIAGNOSIS — E785 Hyperlipidemia, unspecified: Secondary | ICD-10-CM | POA: Diagnosis not present

## 2017-08-12 DIAGNOSIS — Z882 Allergy status to sulfonamides status: Secondary | ICD-10-CM | POA: Diagnosis not present

## 2017-08-12 DIAGNOSIS — G4730 Sleep apnea, unspecified: Secondary | ICD-10-CM | POA: Insufficient documentation

## 2017-08-12 DIAGNOSIS — Z79899 Other long term (current) drug therapy: Secondary | ICD-10-CM | POA: Diagnosis not present

## 2017-08-12 DIAGNOSIS — G8929 Other chronic pain: Secondary | ICD-10-CM | POA: Diagnosis present

## 2017-08-12 DIAGNOSIS — Z885 Allergy status to narcotic agent status: Secondary | ICD-10-CM | POA: Diagnosis not present

## 2017-08-12 NOTE — Progress Notes (Signed)
Safety precautions to be maintained throughout the outpatient stay will include: orient to surroundings, keep bed in low position, maintain call bell within reach at all times, provide assistance with transfer out of bed and ambulation.  

## 2017-08-12 NOTE — Progress Notes (Signed)
Patient's Name: [name]  MRN: [id number]  Referring Provider: Lauro Regulus, MD  DOB: 1945-09-04  PCP: Lauro Regulus, MD  DOS: 08/12/2017  Note by: Edward Jolly, MD  Service setting: Ambulatory outpatient  Specialty: Interventional Pain Management  Location: [facility] (AMB) Pain Management Facility    Patient type: Established   Primary Reason(s) for Visit: Encounter for post-procedure evaluation of chronic illness with mild to moderate exacerbation CC: Back Pain (lower)  HPI  [name] is a 72 y.o. year old, female patient, who comes today for a post-procedure evaluation. She has Lumbar spondylosis; Lumbar facet arthropathy; and Lumbar degenerative disc disease on their problem list. Her primarily concern today is the Back Pain (lower)  Pain Assessment: Location: Lower Back Radiating: denies Onset: More than a month ago Duration: Chronic pain Quality:   Severity: 1 /10 (self-reported pain score)  Note: Reported level is compatible with observation.                         When using our objective Pain Scale, levels between 6 and 10/10 are said to belong in an emergency room, as it progressively worsens from a 6/10, described as severely limiting, requiring emergency care not usually available at an outpatient pain management facility. At a 6/10 level, communication becomes difficult and requires great effort. Assistance to reach the emergency department may be required. Facial flushing and profuse sweating along with potentially dangerous increases in heart rate and blood pressure will be evident. Effect on ADL:   Timing: Intermittent Modifying factors: Tylenol  [name] comes in today for post-procedure evaluation after the treatment done on 06/30/2017.  Further details on both, my assessment(s), as well as the proposed treatment plan, please see below.  Post-Procedure Assessment  06/30/2017 Procedure: Right L3, L4, L5  RFA Pre-procedure pain score:  2/10 Post-procedure pain score: 0/10         Influential Factors: BMI: 48.09 kg/m Intra-procedural challenges: None observed.         Assessment challenges: None detected.              Reported side-effects: None.        Post-procedural adverse reactions or complications: None reported         Sedation: Please see nurses note. When no sedatives are used, the analgesic levels obtained are directly associated to the effectiveness of the local anesthetics. However, when sedation is provided, the level of analgesia obtained during the initial 1 hour following the intervention, is believed to be the result of a combination of factors. These factors may include, but are not limited to: 1. The effectiveness of the local anesthetics used. 2. The effects of the analgesic(s) and/or anxiolytic(s) used. 3. The degree of discomfort experienced by the patient at the time of the procedure. 4. The patients ability and reliability in recalling and recording the events. 5. The presence and influence of possible secondary gains and/or psychosocial factors. Reported result: Relief experienced during the 1st hour after the procedure: 100 % (Ultra-Short Term Relief)            Interpretative annotation: Clinically appropriate result. Analgesia during this period is likely to be Local Anesthetic and/or IV Sedative (Analgesic/Anxiolytic) related.          Effects of local anesthetic: The analgesic effects attained during this period are directly associated to the localized infiltration of local anesthetics and therefore cary significant diagnostic value as to the etiological  location, or anatomical origin, of the pain. Expected duration of relief is directly dependent on the pharmacodynamics of the local anesthetic used. Long-acting (4-6 hours) anesthetics used.  Reported result: Relief during the next 4 to 6 hour after the procedure: 100 % (Short-Term Relief)            Interpretative  annotation: Clinically appropriate result. Analgesia during this period is likely to be Local Anesthetic-related.          Long-term benefit: Defined as the period of time past the expected duration of local anesthetics (1 hour for short-acting and 4-6 hours for long-acting). With the possible exception of prolonged sympathetic blockade from the local anesthetics, benefits during this period are typically attributed to, or associated with, other factors such as analgesic sensory neuropraxia, antiinflammatory effects, or beneficial biochemical changes provided by agents other than the local anesthetics.  Reported result: Extended relief following procedure: 95 % (Long-Term Relief)            Interpretative annotation: Clinically appropriate result. Complete relief. Therapeutic success. Inflammation plays a part in the etiology to the pain.          Current benefits: Defined as reported results that persistent at this point in time.   Analgesia: 90 % [name] reports that both, extremity and the axial pain improved with the treatment. Function: [name] reports improvement in function ROM: [name] reports improvement in ROM Interpretative annotation: Complete relief. Therapeutic benefit observed. Effective therapeutic approach.          Interpretation: Results would suggest a successful therapeutic intervention.                  Plan:  Please see "Plan of Care" for details.                 Meds   Current Outpatient Medications:  .  amantadine (SYMMETREL) 100 MG capsule, Take 100 mg by mouth 2 (two) times daily., Disp: , Rfl:  .  amLODipine (NORVASC) 5 MG tablet, Take 5 mg by mouth daily., Disp: , Rfl:  .  Ascorbic Acid (VITAMIN C) 1000 MG tablet, Take 1,000 mg daily by mouth., Disp: , Rfl:  .  aspirin EC 81 MG tablet, Take 81 mg by mouth daily., Disp: , Rfl:  .  azelastine (ASTELIN) 0.1 % nasal spray, 2 sprays as needed for rhinitis. Use in  each nostril as directed, Disp: , Rfl:  .  B Complex Vitamins (VITAMIN-B COMPLEX PO), Take 100 mg daily by mouth., Disp: , Rfl:  .  baclofen (LIORESAL) 20 MG tablet, Take 20 mg by mouth daily as needed for muscle spasms., Disp: , Rfl:  .  carvedilol (COREG) 25 MG tablet, Take 25 mg 4 (four) times daily by mouth. , Disp: , Rfl:  .  celecoxib (CELEBREX) 200 MG capsule, Take 200 mg by mouth 2 (two) times daily., Disp: , Rfl:  .  Cholecalciferol (VITAMIN D3) 2000 units TABS, Take 2,000 Units daily by mouth., Disp: , Rfl:  .  donepezil (ARICEPT) 10 MG tablet, Take 10 mg by mouth at bedtime., Disp: , Rfl:  .  esomeprazole (NEXIUM) 40 MG capsule, Take 40 mg by mouth daily., Disp: , Rfl:  .  folic acid (FOLVITE) 400 MCG tablet, Take 400 mcg by mouth 2 (two) times daily., Disp: , Rfl:  .  levothyroxine (SYNTHROID, LEVOTHROID) 112 MCG tablet, Take 112 mcg by mouth daily., Disp: , Rfl:  .  losartan (COZAAR) 100  MG tablet, Take 100 mg by mouth daily., Disp: , Rfl:  .  Melatonin 3 MG TABS, Take 1 tablet at bedtime by mouth. , Disp: , Rfl:  .  montelukast (SINGULAIR) 10 MG tablet, Take 10 mg by mouth every evening., Disp: , Rfl:  .  Multiple Vitamin (MULTIVITAMIN) tablet, Take 1 tablet by mouth daily., Disp: , Rfl:  .  OnabotulinumtoxinA (BOTOX IJ), Inject every 4 (four) months as directed., Disp: , Rfl:  .  potassium chloride SA (K-DUR,KLOR-CON) 20 MEQ tablet, Take 20 mEq by mouth 3 (three) times daily., Disp: , Rfl:  .  torsemide (DEMADEX) 10 MG tablet, Take 10 mg daily by mouth., Disp: , Rfl:  .  traZODone (DESYREL) 50 MG tablet, Take by mouth at bedtime. 50-150mg  nightly as needed, Disp: , Rfl:  .  Turmeric 500 MG TABS, Take 500 mg by mouth. 2 tablets one time per day, Disp: , Rfl:  .  venlafaxine (EFFEXOR) 75 MG tablet, Take 75 mg by mouth 3 (three) times daily., Disp: , Rfl:  .  predniSONE (STERAPRED UNI-PAK 21 TAB) 5 MG (21) TBPK tablet, Take 5 mg by mouth daily., Disp: , Rfl:  .  tapentadol (NUCYNTA)  50 MG tablet, Take 50 mg by mouth as needed. , Disp: , Rfl:  .  traMADol (ULTRAM) 50 MG tablet, Take 50 mg by mouth every 12 (twelve) hours as needed. , Disp: , Rfl:   ROS  Constitutional: Denies any fever or chills Gastrointestinal: No reported hemesis, hematochezia, vomiting, or acute GI distress Musculoskeletal: Denies any acute onset joint swelling, redness, loss of ROM, or weakness Neurological: No reported episodes of acute onset apraxia, aphasia, dysarthria, agnosia, amnesia, paralysis, loss of coordination, or loss of consciousness  Allergies  [name] is allergic to codeine; hydromorphone; latex; oxycodone-acetaminophen; sulfa antibiotics; bupropion; gabapentin; hydrocodone; morphine and related; and procaine.  PFSH  Drug: [name]  reports that she does not use drugs. Alcohol:  reports that she drinks about 1.2 oz of alcohol per week. Tobacco:  reports that  has never smoked. she has never used smokeless tobacco. Medical:  has a past medical history of Adenomatous polyp of colon (09/17/2016), Benign positional vertigo (03/04/2016), Brain injury (HCC), Climacteric, Depression, DJD (degenerative joint disease), Dyslipidemia, GERD (gastroesophageal reflux disease), Hypertension, Hypokalemia, Hypothyroidism, IBS (irritable bowel syndrome), OAB (overactive bladder), Ovarian cyst (03/25/2015), Rhinitis, Sleep apnea, and Spasm of thoracolumbar muscle. Surgical: [name]  has a past surgical history that includes Reduction mammaplasty (Bilateral); Ankle reconstruction (Left); Brachioplasty; Cataract extraction; Cholecystectomy; Soft Tissue Tumor Resection; Abdominal hysterectomy (07/12/2014); Joint replacement (Bilateral, 07/2014, 03/2015); nasal reconstrucion; Toe Surgery (Right); Shoulder open rotator cuff repair; and Colonoscopy with propofol (N/A, 12/07/2016). Family: family history includes Breast cancer in her other.  Constitutional Exam   General appearance: Well nourished, well developed, and well hydrated. In no apparent acute distress Vitals:   08/12/17 0845  BP: (!) 150/62  Resp: 18  Temp: 98 F (36.7 C)  TempSrc: Oral  SpO2: 98%  Weight: 289 lb (131.1 kg)  Height: 5\' 5"  (1.651 m)   BMI Assessment: Estimated body mass index is 48.09 kg/m as calculated from the following:   Height as of this encounter: 5\' 5"  (1.651 m).   Weight as of this encounter: 289 lb (131.1 kg).  BMI interpretation table: BMI level Category Range association with higher incidence of chronic pain  <18 kg/m2 Underweight   18.5-24.9 kg/m2 Ideal body weight   25-29.9 kg/m2 Overweight Increased incidence by  20%  30-34.9 kg/m2 Obese (Class I) Increased incidence by 68%  35-39.9 kg/m2 Severe obesity (Class II) Increased incidence by 136%  >40 kg/m2 Extreme obesity (Class III) Increased incidence by 254%   BMI Readings from Last 4 Encounters:  08/12/17 48.09 kg/m  06/30/17 46.32 kg/m  06/09/17 46.32 kg/m  06/02/17 46.00 kg/m   Wt Readings from Last 4 Encounters:  08/12/17 289 lb (131.1 kg)  06/30/17 287 lb (130.2 kg)  06/09/17 287 lb (130.2 kg)  06/02/17 285 lb (129.3 kg)  Psych/Mental status: Alert, oriented x 3 (person, place, & time)       Eyes: PERLA Respiratory: No evidence of acute respiratory distress  Cervical Spine Area Exam  Skin & Axial Inspection: No masses, redness, edema, swelling, or associated skin lesions Alignment: Symmetrical Functional ROM: Unrestricted ROM      Stability: No instability detected Muscle Tone/Strength: Functionally intact. No obvious neuro-muscular anomalies detected. Sensory (Neurological): Unimpaired Palpation: No palpable anomalies              Upper Extremity (UE) Exam    Side: Right upper extremity  Side: Left upper extremity  Skin & Extremity Inspection: Skin color, temperature, and hair growth are WNL. No peripheral edema or cyanosis. No masses, redness, swelling, asymmetry, or  associated skin lesions. No contractures.  Skin & Extremity Inspection: Skin color, temperature, and hair growth are WNL. No peripheral edema or cyanosis. No masses, redness, swelling, asymmetry, or associated skin lesions. No contractures.  Functional ROM: Unrestricted ROM          Functional ROM: Unrestricted ROM          Muscle Tone/Strength: Functionally intact. No obvious neuro-muscular anomalies detected.  Muscle Tone/Strength: Functionally intact. No obvious neuro-muscular anomalies detected.  Sensory (Neurological): Unimpaired          Sensory (Neurological): Unimpaired          Palpation: No palpable anomalies              Palpation: No palpable anomalies              Specialized Test(s): Deferred         Specialized Test(s): Deferred          Thoracic Spine Area Exam  Skin & Axial Inspection: No masses, redness, or swelling Alignment: Symmetrical Functional ROM: Unrestricted ROM Stability: No instability detected Muscle Tone/Strength: Functionally intact. No obvious neuro-muscular anomalies detected. Sensory (Neurological): Unimpaired Muscle strength & Tone: No palpable anomalies  Lumbar Spine Area Exam  Skin & Axial Inspection: No masses, redness, or swelling Alignment: Symmetrical Functional ROM: Improved after treatment      Stability: No instability detected Muscle Tone/Strength: Functionally intact. No obvious neuro-muscular anomalies detected. Sensory (Neurological): Unimpaired Palpation: No palpable anomalies       Provocative Tests: Lumbar Hyperextension and rotation test: Improved after treatment       Lumbar Lateral bending test: Improved after treatment       Patrick's Maneuver: evaluation deferred today                    Gait & Posture Assessment  Ambulation: Unassisted Gait: Relatively normal for age and body habitus Posture: WNL   Lower Extremity Exam    Side: Right lower extremity  Side: Left lower extremity  Skin & Extremity Inspection: Skin color,  temperature, and hair growth are WNL. No peripheral edema or cyanosis. No masses, redness, swelling, asymmetry, or associated skin lesions. No contractures.  Skin & Extremity Inspection: Skin  color, temperature, and hair growth are WNL. No peripheral edema or cyanosis. No masses, redness, swelling, asymmetry, or associated skin lesions. No contractures.  Functional ROM: Unrestricted ROM          Functional ROM: Unrestricted ROM          Muscle Tone/Strength: Functionally intact. No obvious neuro-muscular anomalies detected.  Muscle Tone/Strength: Functionally intact. No obvious neuro-muscular anomalies detected.  Sensory (Neurological): Unimpaired  Sensory (Neurological): Unimpaired  Palpation: No palpable anomalies  Palpation: No palpable anomalies   Assessment  Primary Diagnosis & Pertinent Problem List: The primary encounter diagnosis was Lumbar spondylosis. Diagnoses of Lumbar facet arthropathy and Lumbar degenerative disc disease were also pertinent to this visit.  Status Diagnosis  Controlled Controlled Controlled 1. Lumbar spondylosis   2. Lumbar facet arthropathy   3. Lumbar degenerative disc disease      72 year old female with a history of axial low back pain status post bilateral L3, L4, L5 radiofrequency ablation who presents for follow-up.  Patient notes significant improvement in her axial low back pain symptoms.  She states that she has gone back to work part-time.  She is able to ambulate a longer distance without as much pain.  She notes improvement in functional status and greater ease of performing activities of daily living.  Patient will follow-up in 3 months.   Provider-requested follow-up: Return in about 3 months (around 11/09/2017). Time Note: Greater than 50% of the 15 minute(s) of face-to-face time spent with [name], was spent in counseling/coordination of care regarding: [name]'s primary cause of pain, the treatment plan,  treatment alternatives, the results, interpretation and significance of  her recent diagnostic interventional treatment(s), realistic expectations, the goals of pain management (increased in functionality) and the need to bring and keep the BMI below 30. Future Appointments  Date Time Provider Department Center  11/09/2017  8:30 AM Edward Jolly, MD [facility] None    Primary Care Physician: Lauro Regulus, MD Location: [facility] Note by: Edward Jolly, M.D Date: 08/12/2017; Time: 8:59 AM  There are no Patient Instructions on file for this visit.

## 2017-08-15 ENCOUNTER — Ambulatory Visit
Admission: EM | Admit: 2017-08-15 | Discharge: 2017-08-15 | Disposition: A | Discharge status: Home / Self Care | Payer: Medicare Other | Attending: Family Medicine | Admitting: Family Medicine

## 2017-08-15 ENCOUNTER — Other Ambulatory Visit: Payer: Self-pay

## 2017-08-15 DIAGNOSIS — R5383 Other fatigue: Secondary | ICD-10-CM

## 2017-08-15 DIAGNOSIS — R059 Cough, unspecified: Secondary | ICD-10-CM

## 2017-08-15 DIAGNOSIS — R05 Cough: Secondary | ICD-10-CM | POA: Diagnosis not present

## 2017-08-15 MED ORDER — DOXYCYCLINE HYCLATE 100 MG PO TABS: 100.0000 mg | ORAL_TABLET | Freq: Two times a day (BID) | ORAL | 0 refills | Status: DC

## 2017-08-15 MED ORDER — BENZONATATE 100 MG PO CAPS: 100.0000 mg | ORAL_CAPSULE | Freq: Three times a day (TID) | ORAL | 0 refills | Status: DC | PRN

## 2017-08-15 NOTE — ED Provider Notes (Signed)
[facility]    CSN: [id number] Arrival date & time: 08/15/17  1355     History   Chief Complaint Chief Complaint  Patient presents with  . Cough    HPI [name] is a 72 y.o. female.   The history is provided by the patient.  Cough  Associated symptoms: no wheezing   URI  Presenting symptoms: cough and fatigue   Severity:  Moderate Onset quality:  Sudden Duration:  5 days Timing:  Constant Progression:  Worsening Chronicity:  Recurrent Relieved by:  None tried Ineffective treatments:  None tried Associated symptoms: no wheezing   Risk factors: being elderly and sick contacts   Risk factors: no chronic cardiac disease, no chronic kidney disease, no chronic respiratory disease, no diabetes mellitus, no immunosuppression, no recent illness and no recent travel     Past Medical History:  Diagnosis Date  . Adenomatous polyp of colon 09/17/2016  . Benign positional vertigo 03/04/2016  . Brain injury (HCC)   . Climacteric   . Depression   . 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 Dyslipidemia   . GERD (gastroesophageal reflux disease)   . Hypertension   . Hypokalemia   . Hypothyroidism   . IBS (irritable bowel syndrome)   . OAB (overactive bladder)   . Ovarian cyst 03/25/2015   3cm simple cyst on right ovary  . Rhinitis   . Sleep apnea   . Spasm of thoracolumbar muscle     Patient Active Problem List   Diagnosis Date Noted  . Lumbar spondylosis 04/20/2017  . Lumbar facet arthropathy 04/20/2017  . Lumbar degenerative disc disease 04/20/2017    Past Surgical History:  Procedure Laterality Date  . ABDOMINAL HYSTERECTOMY  07/12/2014  . ANKLE RECONSTRUCTION Left   . BRACHIOPLASTY    . CATARACT EXTRACTION    . CHOLECYSTECTOMY    . COLONOSCOPY WITH PROPOFOL N/A 12/07/2016   Procedure: COLONOSCOPY WITH PROPOFOL;  Surgeon: Scot Jun, MD;  Location: [facility] ENDOSCOPY;  Service: Endoscopy;  Laterality: N/A;   . JOINT REPLACEMENT Bilateral 07/2014, 03/2015  . nasal reconstrucion    . REDUCTION MAMMAPLASTY Bilateral   . SHOULDER OPEN ROTATOR CUFF REPAIR    . SOFT TISSUE TUMOR RESECTION     excision tumor soft tissue neck, and soft tissue subcutaneous axilla  . TOE SURGERY Right    reconstruction right foot 4th and 5th toe    OB History    No data available       Home Medications    Prior to Admission medications   Medication Sig Start Date End Date Taking? Authorizing Provider  amantadine (SYMMETREL) 100 MG capsule Take 100 mg by mouth 2 (two) times daily.    [provider]  amLODipine (NORVASC) 5 MG tablet Take 5 mg by mouth daily.    [provider]  Ascorbic Acid (VITAMIN C) 1000 MG tablet Take 1,000 mg daily by mouth.    [provider]  aspirin EC 81 MG tablet Take 81 mg by mouth daily.    [provider]  azelastine (ASTELIN) 0.1 % nasal spray 2 sprays as needed for rhinitis. Use in each nostril as directed    [provider]  B Complex Vitamins (VITAMIN-B COMPLEX PO) Take 100 mg daily by mouth.    [provider]  baclofen (LIORESAL) 20 MG tablet Take 20 mg by mouth daily as needed for muscle spasms.    [provider]  benzonatate (TESSALON) 100 MG capsule Take 1 capsule (100 mg total) by mouth 3 (three) times daily as needed. 08/15/17   [name], MD  carvedilol (COREG) 25 MG tablet Take 25 mg 4 (four) times daily by mouth.     [provider]  celecoxib (CELEBREX) 200 MG capsule Take 200 mg by mouth 2 (two) times daily.    [provider]  Cholecalciferol (VITAMIN D3) 2000 units TABS Take 2,000 Units daily by mouth.    [provider]  donepezil (ARICEPT) 10 MG tablet Take 10 mg by mouth at bedtime.    [provider]  doxycycline (VIBRA-TABS) 100 MG tablet Take 1 tablet (100 mg total) by mouth 2 (two) times daily. 08/15/17   [name], MD  esomeprazole (NEXIUM) 40 MG capsule  Take 40 mg by mouth daily.    [provider]  folic acid (FOLVITE) 400 MCG tablet Take 400 mcg by mouth 2 (two) times daily.    [provider]  levothyroxine (SYNTHROID, LEVOTHROID) 112 MCG tablet Take 112 mcg by mouth daily.    [provider]  losartan (COZAAR) 100 MG tablet Take 100 mg by mouth daily.    [provider]  Melatonin 3 MG TABS Take 1 tablet at bedtime by mouth.     [provider]  montelukast (SINGULAIR) 10 MG tablet Take 10 mg by mouth every evening.    [provider]  Multiple Vitamin (MULTIVITAMIN) tablet Take 1 tablet by mouth daily.    [provider]  OnabotulinumtoxinA (BOTOX IJ) Inject every 4 (four) months as directed.    [provider]  potassium chloride SA (K-DUR,KLOR-CON) 20 MEQ tablet Take 20 mEq by mouth 3 (three) times daily.    [provider]  predniSONE (STERAPRED UNI-PAK 21 TAB) 5 MG (21) TBPK tablet Take 5 mg by mouth daily.    [provider]  tapentadol (NUCYNTA) 50 MG tablet Take 50 mg by mouth as needed.     [provider]  torsemide (DEMADEX) 10 MG tablet Take 10 mg daily by mouth.    [provider]  traMADol (ULTRAM) 50 MG tablet Take 50 mg by mouth every 12 (twelve) hours as needed.     [provider]  traZODone (DESYREL) 50 MG tablet Take by mouth at bedtime. 50-150mg  nightly as needed    [provider]  Turmeric 500 MG TABS Take 500 mg by mouth. 2 tablets one time per day    [provider]  venlafaxine (EFFEXOR) 75 MG tablet Take 75 mg by mouth 3 (three) times daily.    [provider]    Family History Family History  Problem Relation Age of Onset  . Breast cancer Other     Social History Social History   Tobacco Use  . Smoking status: Never Smoker  . Smokeless tobacco: Never Used  Substance Use Topics  . Alcohol use: Yes    Alcohol/week: 1.2 oz    Types: 2 Glasses of wine per week     Comment: per week  . Drug use: No     Allergies   Codeine; Hydromorphone; Latex; Oxycodone-acetaminophen; Sulfa antibiotics; Bupropion; Gabapentin; Hydrocodone; Morphine and related; and Procaine   Review of Systems Review of Systems  Constitutional: Positive for fatigue.  Respiratory: Positive for cough. Negative for wheezing.      Physical Exam Triage Vital Signs ED Triage Vitals  Enc Vitals Group     BP 08/15/17 1456 (!) 143/57  Pulse Rate 08/15/17 1456 71     Resp 08/15/17 1456 (!) 22     Temp 08/15/17 1456 97.9 F (36.6 C)     Temp Source 08/15/17 1456 Oral     SpO2 08/15/17 1456 97 %     Weight 08/15/17 1458 289 lb (131.1 kg)     Height 08/15/17 1458 5\' 5"  (1.651 m)     Head Circumference --      Peak Flow --      Pain Score 08/15/17 1458 0     Pain Loc --      Pain Edu? --      Excl. in GC? --    No data found.  Updated Vital Signs BP (!) 143/57 (BP Location: Right Arm)   Pulse 71   Temp 97.9 F (36.6 C) (Oral)   Resp (!) 22   Ht 5\' 5"  (1.651 m)   Wt 289 lb (131.1 kg)   SpO2 97%   BMI 48.09 kg/m   Visual Acuity Right Eye Distance:   Left Eye Distance:   Bilateral Distance:    Right Eye Near:   Left Eye Near:    Bilateral Near:     Physical Exam  Constitutional: She appears well-developed and well-nourished. No distress.  HENT:  Head: Normocephalic and atraumatic.  Right Ear: Tympanic membrane, external ear and ear canal normal.  Left Ear: Tympanic membrane, external ear and ear canal normal.  Nose: Rhinorrhea present. No mucosal edema, nose lacerations, sinus tenderness, nasal deformity, septal deviation or nasal septal hematoma. No epistaxis.  No foreign bodies. Right sinus exhibits no maxillary sinus tenderness and no frontal sinus tenderness. Left sinus exhibits no maxillary sinus tenderness and no frontal sinus tenderness.  Mouth/Throat: Uvula is midline, oropharynx is clear and moist and mucous membranes are normal. No oropharyngeal  exudate.  Eyes: Conjunctivae and EOM are normal. Pupils are equal, round, and reactive to light. Right eye exhibits no discharge. Left eye exhibits no discharge. No scleral icterus.  Neck: Normal range of motion. Neck supple. No thyromegaly present.  Cardiovascular: Normal rate, regular rhythm and normal heart sounds.  Pulmonary/Chest: Effort normal. No stridor. No respiratory distress. She has no wheezes. She has no rales.  rhonchi  Lymphadenopathy:    She has no cervical adenopathy.  Skin: She is not diaphoretic.  Nursing note and vitals reviewed.    UC Treatments / Results  Labs (all labs ordered are listed, but only abnormal results are displayed) Labs Reviewed - No data to display  EKG  EKG Interpretation None       Radiology No results found.  Procedures Procedures (including critical care time)  Medications Ordered in UC Medications - No data to display   Initial Impression / Assessment and Plan / UC Course  I have reviewed the triage vital signs and the nursing notes.  Pertinent labs & imaging results that were available during my care of the patient were reviewed by me and considered in my medical decision making (see chart for details).       Final Clinical Impressions(s) / UC Diagnoses   Final diagnoses:  Cough    ED Discharge Orders        Ordered    doxycycline (VIBRA-TABS) 100 MG tablet  2 times daily     08/15/17 1552    benzonatate (TESSALON) 100 MG capsule  3 times daily PRN     08/15/17 1552     1. diagnosis reviewed with patient 2. rx as  per orders above; reviewed possible side effects, interactions, risks and benefits  3. Recommend supportive treatment with rest, fluids, otc analgesics prn  4. Follow-up prn if symptoms worsen or don't improve  Controlled Substance Prescriptions [place] Controlled Substance Registry consulted? Not Applicable   Payton Mccallumonty, Deshara Rossi, MD 08/15/17 1556

## 2017-08-15 NOTE — ED Triage Notes (Signed)
Pt with frequent bronchitis and has had 4 days of cough which she reports is consistent with her typical bronchitis.

## 2017-09-27 ENCOUNTER — Other Ambulatory Visit: Payer: Self-pay | Admitting: Internal Medicine

## 2017-09-27 DIAGNOSIS — Z1231 Encounter for screening mammogram for malignant neoplasm of breast: Secondary | ICD-10-CM

## 2017-09-29 ENCOUNTER — Ambulatory Visit
Admission: RE | Admit: 2017-09-29 | Discharge: 2017-09-29 | Disposition: A | Discharge status: Home / Self Care | Payer: Medicare Other | Source: Ambulatory Visit | Attending: Internal Medicine | Admitting: Internal Medicine

## 2017-09-29 DIAGNOSIS — Z1231 Encounter for screening mammogram for malignant neoplasm of breast: Secondary | ICD-10-CM | POA: Diagnosis present

## 2017-11-09 ENCOUNTER — Other Ambulatory Visit: Payer: Self-pay

## 2017-11-09 ENCOUNTER — Encounter: Payer: Self-pay | Admitting: Student in an Organized Health Care Education/Training Program

## 2017-11-09 ENCOUNTER — Ambulatory Visit
Payer: Medicare Other | Attending: Student in an Organized Health Care Education/Training Program | Admitting: Student in an Organized Health Care Education/Training Program

## 2017-11-09 VITALS — BP 117/69 | HR 76 | Temp 98.0°F | Ht 66.5 in | Wt 292.0 lb

## 2017-11-09 DIAGNOSIS — Z888 Allergy status to other drugs, medicaments and biological substances status: Secondary | ICD-10-CM | POA: Insufficient documentation

## 2017-11-09 DIAGNOSIS — I10 Essential (primary) hypertension: Secondary | ICD-10-CM | POA: Insufficient documentation

## 2017-11-09 DIAGNOSIS — Z885 Allergy status to narcotic agent status: Secondary | ICD-10-CM | POA: Insufficient documentation

## 2017-11-09 DIAGNOSIS — Z882 Allergy status to sulfonamides status: Secondary | ICD-10-CM | POA: Diagnosis not present

## 2017-11-09 DIAGNOSIS — E039 Hypothyroidism, unspecified: Secondary | ICD-10-CM | POA: Insufficient documentation

## 2017-11-09 DIAGNOSIS — K589 Irritable bowel syndrome without diarrhea: Secondary | ICD-10-CM | POA: Diagnosis not present

## 2017-11-09 DIAGNOSIS — G4730 Sleep apnea, unspecified: Secondary | ICD-10-CM | POA: Diagnosis not present

## 2017-11-09 DIAGNOSIS — Z6841 Body Mass Index (BMI) 40.0 and over, adult: Secondary | ICD-10-CM | POA: Insufficient documentation

## 2017-11-09 DIAGNOSIS — G8929 Other chronic pain: Secondary | ICD-10-CM | POA: Insufficient documentation

## 2017-11-09 DIAGNOSIS — M47816 Spondylosis without myelopathy or radiculopathy, lumbar region: Secondary | ICD-10-CM

## 2017-11-09 DIAGNOSIS — Z79899 Other long term (current) drug therapy: Secondary | ICD-10-CM | POA: Insufficient documentation

## 2017-11-09 DIAGNOSIS — F329 Major depressive disorder, single episode, unspecified: Secondary | ICD-10-CM | POA: Insufficient documentation

## 2017-11-09 DIAGNOSIS — Z7982 Long term (current) use of aspirin: Secondary | ICD-10-CM | POA: Diagnosis not present

## 2017-11-09 DIAGNOSIS — E876 Hypokalemia: Secondary | ICD-10-CM | POA: Insufficient documentation

## 2017-11-09 DIAGNOSIS — E785 Hyperlipidemia, unspecified: Secondary | ICD-10-CM | POA: Diagnosis not present

## 2017-11-09 DIAGNOSIS — K219 Gastro-esophageal reflux disease without esophagitis: Secondary | ICD-10-CM | POA: Insufficient documentation

## 2017-11-09 DIAGNOSIS — M5136 Other intervertebral disc degeneration, lumbar region: Secondary | ICD-10-CM

## 2017-11-09 MED ORDER — KETOROLAC TROMETHAMINE 30 MG/ML IJ SOLN
30.0000 mg | Freq: Once | INTRAMUSCULAR | Status: AC
  Administered 2017-11-09: 30 mg via INTRAMUSCULAR
  Filled 2017-11-09: qty 1

## 2017-11-09 MED ORDER — ORPHENADRINE CITRATE 30 MG/ML IJ SOLN
30.0000 mg | Freq: Once | INTRAMUSCULAR | Status: AC
  Administered 2017-11-09: 30 mg via INTRAMUSCULAR
  Filled 2017-11-09: qty 2

## 2017-11-09 NOTE — Patient Instructions (Signed)

## 2017-11-09 NOTE — Progress Notes (Signed)
Patient's Name: [name]  MRN: [id number]  Referring Provider: Lauro Regulus, MD  DOB: 30-Aug-1945  PCP: Lauro Regulus, MD  DOS: 11/09/2017  Note by: Edward Jolly, MD  Service setting: Ambulatory outpatient  Specialty: Interventional Pain Management  Location: [facility] (AMB) Pain Management Facility    Patient type: Established   Primary Reason(s) for Visit: Evaluation of chronic illnesses with exacerbation, or progression (Level of risk: moderate) CC: Back Pain (lower)  HPI  [name] is a 72 y.o. year old, female patient, who comes today for a follow-up evaluation. She has Lumbar spondylosis; Lumbar facet arthropathy; and Lumbar degenerative disc disease on their problem list. [name] was last seen on 08/12/2017. Her primarily concern today is the Back Pain (lower)  Pain Assessment: Location: Lower Back Radiating: denies Onset: More than a month ago Duration: Chronic pain Quality: Aching Severity: 4 /10 (subjective, self-reported pain score)  Note: Reported level is compatible with observation.                         When using our objective Pain Scale, levels between 6 and 10/10 are said to belong in an emergency room, as it progressively worsens from a 6/10, described as severely limiting, requiring emergency care not usually available at an outpatient pain management facility. At a 6/10 level, communication becomes difficult and requires great effort. Assistance to reach the emergency department may be required. Facial flushing and profuse sweating along with potentially dangerous increases in heart rate and blood pressure will be evident. Effect on ADL:   Timing: Constant Modifying factors: procedures, tylenol BP: 117/69  HR: 76  Further details on both, my assessment(s), as well as the proposed treatment plan, please see below.  Lumbosacral Imaging: Lumbar MR wo contrast:  Results for orders placed during the hospital  encounter of 10/28/16  MR LUMBAR SPINE WO CONTRAST   Narrative CLINICAL DATA:  L4-5 spinal stenosis. Chronic low back and left leg pain.  EXAM: MRI LUMBAR SPINE WITHOUT CONTRAST  TECHNIQUE: Multiplanar, multisequence MR imaging of the lumbar spine was performed. No intravenous contrast was administered.  COMPARISON:  None.  FINDINGS: Segmentation: Normal lumbar segmentation is assumed, with the lowest fully formed disc space designated L5-S1.  Alignment: Mild lumbar dextroscoliosis with apex at L3. Trace retrolisthesis of L1 on L2.  Vertebrae: Preserved vertebral body heights without evidence of fracture. 1.2 x 1.0 cm T1 hypointense, T2 hyperintense cystic focus in the posterior left T12 vertebral body and pedicle has a chronic appearance with surrounding fatty marrow change and no significant edema, indeterminate but possibly an intraosseous ganglion or meningeal cyst. Advanced disc degeneration is present at L1-2 greater than L2-3 with type 2 endplate changes at both levels and mild type 1 change at L1-2.  Conus medullaris: Extends to the L1 level and appears normal.  Paraspinal and other soft tissues: Asymmetric left renal atrophy. Partially visualized 3.9 cm cyst in the right pelvis/adnexa.  Disc levels:  T11-12: Only imaged sagittally. Disc desiccation and moderate disc space narrowing. Minimal disc bulging without stenosis.  T12-L1: Disc desiccation and mild disc space narrowing. Minimal disc bulging without stenosis.  L1-2: Disc desiccation and severe disc space narrowing. Disc bulging results in mild left lateral recess stenosis without spinal or neural foraminal stenosis.  L2-3: Disc desiccation and moderate left-sided disc space narrowing. Mild disc bulging without significant stenosis.  L3-4: Disc desiccation and mild disc space narrowing. Mild disc bulging without stenosis.  L4-5:  Mild facet arthrosis without disc herniation or stenosis.  L5-S1:  Moderate facet arthrosis and at most minimal disc bulging without stenosis. Right facet joint effusion.  IMPRESSION: 1. Lumbar dextroscoliosis. 2. Advanced disc degeneration at L1-2 with mild left lateral recess stenosis. 3. Disc degeneration elsewhere as above without significant stenosis. 4. Moderate L5-S1 facet arthrosis. 5. 3.9 cm right adnexal cyst. Recommend pelvic ultrasound for further evaluation.   Electronically Signed   By: Sebastian Ache M.D.   On: 10/28/2016 17:29     Complexity Note: Imaging results reviewed. Results shared with [name], using Layman's terms.                         Meds   Current Outpatient Medications:  .  acetaminophen (TYLENOL) 500 MG tablet, Take 500 mg by mouth every 6 (six) hours as needed., Disp: , Rfl:  .  amantadine (SYMMETREL) 100 MG capsule, Take 100 mg by mouth 2 (two) times daily., Disp: , Rfl:  .  amLODipine (NORVASC) 5 MG tablet, Take 5 mg by mouth daily., Disp: , Rfl:  .  Ascorbic Acid (VITAMIN C) 1000 MG tablet, Take 1,000 mg daily by mouth., Disp: , Rfl:  .  aspirin EC 81 MG tablet, Take 81 mg by mouth daily., Disp: , Rfl:  .  azelastine (ASTELIN) 0.1 % nasal spray, 2 sprays as needed for rhinitis. Use in each nostril as directed, Disp: , Rfl:  .  B Complex Vitamins (VITAMIN-B COMPLEX PO), Take 100 mg daily by mouth., Disp: , Rfl:  .  baclofen (LIORESAL) 20 MG tablet, Take 20 mg by mouth daily as needed for muscle spasms., Disp: , Rfl:  .  carvedilol (COREG) 25 MG tablet, Take 25 mg 4 (four) times daily by mouth. , Disp: , Rfl:  .  celecoxib (CELEBREX) 200 MG capsule, Take 200 mg by mouth 2 (two) times daily., Disp: , Rfl:  .  Cholecalciferol (VITAMIN D3) 2000 units TABS, Take 2,000 Units daily by mouth., Disp: , Rfl:  .  donepezil (ARICEPT) 10 MG tablet, Take 10 mg by mouth at bedtime., Disp: , Rfl:  .  esomeprazole (NEXIUM) 40 MG capsule, Take 40 mg by mouth daily., Disp: , Rfl:  .  folic acid (FOLVITE) 400 MCG  tablet, Take 400 mcg by mouth 2 (two) times daily., Disp: , Rfl:  .  levothyroxine (SYNTHROID, LEVOTHROID) 112 MCG tablet, Take 112 mcg by mouth daily., Disp: , Rfl:  .  losartan (COZAAR) 100 MG tablet, Take 100 mg by mouth daily., Disp: , Rfl:  .  Melatonin 3 MG TABS, Take 1 tablet at bedtime by mouth. , Disp: , Rfl:  .  montelukast (SINGULAIR) 10 MG tablet, Take 10 mg by mouth every evening., Disp: , Rfl:  .  Multiple Vitamin (MULTIVITAMIN) tablet, Take 1 tablet by mouth daily., Disp: , Rfl:  .  OnabotulinumtoxinA (BOTOX IJ), Inject every 4 (four) months as directed., Disp: , Rfl:  .  potassium chloride SA (K-DUR,KLOR-CON) 20 MEQ tablet, Take 20 mEq by mouth 3 (three) times daily., Disp: , Rfl:  .  torsemide (DEMADEX) 10 MG tablet, Take 10 mg daily by mouth., Disp: , Rfl:  .  traZODone (DESYREL) 50 MG tablet, Take by mouth at bedtime. 50-150mg  nightly as needed, Disp: , Rfl:  .  Turmeric 500 MG TABS, Take 500 mg by mouth. 2 tablets one time per day, Disp: , Rfl:  .  venlafaxine (EFFEXOR) 75 MG  tablet, Take 75 mg by mouth 3 (three) times daily., Disp: , Rfl:  .  benzonatate (TESSALON) 100 MG capsule, Take 1 capsule (100 mg total) by mouth 3 (three) times daily as needed. (Patient not taking: Reported on 11/09/2017), Disp: 21 capsule, Rfl: 0 .  doxycycline (VIBRA-TABS) 100 MG tablet, Take 1 tablet (100 mg total) by mouth 2 (two) times daily. (Patient not taking: Reported on 11/09/2017), Disp: 14 tablet, Rfl: 0 .  predniSONE (STERAPRED UNI-PAK 21 TAB) 5 MG (21) TBPK tablet, Take 5 mg by mouth daily., Disp: , Rfl:  .  tapentadol (NUCYNTA) 50 MG tablet, Take 50 mg by mouth as needed. , Disp: , Rfl:  .  traMADol (ULTRAM) 50 MG tablet, Take 50 mg by mouth every 12 (twelve) hours as needed. , Disp: , Rfl:   ROS  Constitutional: Denies any fever or chills Gastrointestinal: No reported hemesis, hematochezia, vomiting, or acute GI distress Musculoskeletal: Denies any acute onset joint swelling, redness,  loss of ROM, or weakness Neurological: No reported episodes of acute onset apraxia, aphasia, dysarthria, agnosia, amnesia, paralysis, loss of coordination, or loss of consciousness  Allergies  [name] is allergic to codeine; hydromorphone; latex; oxycodone-acetaminophen; sulfa antibiotics; bupropion; gabapentin; hydrocodone; morphine and related; and procaine.  PFSH  Drug: [name]  reports that she does not use drugs. Alcohol:  reports that she drinks about 1.2 oz of alcohol per week. Tobacco:  reports that she has never smoked. She has never used smokeless tobacco. Medical:  has a past medical history of Benign positional vertigo (03/04/2016), Brain injury (HCC), Climacteric, Depression, DJD (degenerative joint disease), Dyslipidemia, GERD (gastroesophageal reflux disease), Hypertension, Hypokalemia, Hypothyroidism, IBS (irritable bowel syndrome), OAB (overactive bladder), Ovarian cyst (03/25/2015), Rhinitis, Sleep apnea, and Spasm of thoracolumbar muscle. Surgical: [name]  has a past surgical history that includes Ankle reconstruction (Left); Brachioplasty; Cataract extraction; Cholecystectomy; Soft Tissue Tumor Resection; Abdominal hysterectomy (07/12/2014); Joint replacement (Bilateral, 07/2014, 03/2015); nasal reconstrucion; Toe Surgery (Right); Shoulder open rotator cuff repair; Colonoscopy with propofol (N/A, 12/07/2016); and Reduction mammaplasty (Bilateral). Family: family history includes Breast cancer in her paternal aunt.  Constitutional Exam  General appearance: Well nourished, well developed, and well hydrated. In no apparent acute distress Vitals:   11/09/17 0906  BP: 117/69  Pulse: 76  Temp: 98 F (36.7 C)  TempSrc: Oral  SpO2: 98%  Weight: 292 lb (132.5 kg)  Height: 5' 6.5" (1.689 m)   BMI Assessment: Estimated body mass index is 46.42 kg/m as calculated from the following:   Height as of this encounter: 5' 6.5" (1.689  m).   Weight as of this encounter: 292 lb (132.5 kg).  BMI interpretation table: BMI level Category Range association with higher incidence of chronic pain  <18 kg/m2 Underweight   18.5-24.9 kg/m2 Ideal body weight   25-29.9 kg/m2 Overweight Increased incidence by 20%  30-34.9 kg/m2 Obese (Class I) Increased incidence by 68%  35-39.9 kg/m2 Severe obesity (Class II) Increased incidence by 136%  >40 kg/m2 Extreme obesity (Class III) Increased incidence by 254%   Patient's current BMI Ideal Body weight  Body mass index is 46.42 kg/m. Ideal body weight: 60.5 kg (133 lb 4.3 oz) Adjusted ideal body weight: 89.3 kg (196 lb 12.2 oz)   BMI Readings from Last 4 Encounters:  11/09/17 46.42 kg/m  08/15/17 48.09 kg/m  08/12/17 48.09 kg/m  06/30/17 46.32 kg/m   Wt Readings from Last 4 Encounters:  11/09/17 292 lb (132.5 kg)  08/15/17 289 lb (  131.1 kg)  08/12/17 289 lb (131.1 kg)  06/30/17 287 lb (130.2 kg)  Psych/Mental status: Alert, oriented x 3 (person, place, & time)       Eyes: PERLA Respiratory: No evidence of acute respiratory distress  Cervical Spine Area Exam  Skin & Axial Inspection: No masses, redness, edema, swelling, or associated skin lesions Alignment: Symmetrical Functional ROM: Unrestricted ROM      Stability: No instability detected Muscle Tone/Strength: Functionally intact. No obvious neuro-muscular anomalies detected. Sensory (Neurological): Unimpaired Palpation: No palpable anomalies              Upper Extremity (UE) Exam    Side: Right upper extremity  Side: Left upper extremity  Skin & Extremity Inspection: Skin color, temperature, and hair growth are WNL. No peripheral edema or cyanosis. No masses, redness, swelling, asymmetry, or associated skin lesions. No contractures.  Skin & Extremity Inspection: Skin color, temperature, and hair growth are WNL. No peripheral edema or cyanosis. No masses, redness, swelling, asymmetry, or associated skin lesions. No  contractures.  Functional ROM: Unrestricted ROM          Functional ROM: Unrestricted ROM          Muscle Tone/Strength: Functionally intact. No obvious neuro-muscular anomalies detected.  Muscle Tone/Strength: Functionally intact. No obvious neuro-muscular anomalies detected.  Sensory (Neurological): Unimpaired          Sensory (Neurological): Unimpaired          Palpation: No palpable anomalies              Palpation: No palpable anomalies              Provocative Test(s):  Phalen's test: deferred Tinel's test: deferred Apley's scratch test (touch opposite shoulder):  Action 1 (Across chest): deferred Action 2 (Overhead): deferred Action 3 (LB reach): deferred   Provocative Test(s):  Phalen's test: deferred Tinel's test: deferred Apley's scratch test (touch opposite shoulder):  Action 1 (Across chest): deferred Action 2 (Overhead): deferred Action 3 (LB reach): deferred    Thoracic Spine Area Exam  Skin & Axial Inspection: No masses, redness, or swelling Alignment: Symmetrical Functional ROM: Unrestricted ROM Stability: No instability detected Muscle Tone/Strength: Functionally intact. No obvious neuro-muscular anomalies detected. Sensory (Neurological): Unimpaired Muscle strength & Tone: No palpable anomalies  Lumbar Spine Area Exam  Skin & Axial Inspection: No masses, redness, or swelling Alignment: Symmetrical Functional ROM: Decreased ROM affecting both sides Stability: No instability detected Muscle Tone/Strength: Functionally intact. No obvious neuro-muscular anomalies detected. Sensory (Neurological): Articular pain pattern Palpation: No palpable anomalies       Provocative Tests: Lumbar Hyperextension/rotation test: Positive bilaterally for facet joint pain. Lumbar quadrant test (Kemp's test): (+) bilaterally for facet joint pain. Lumbar Lateral bending test: (+) due to pain. Patrick's Maneuver: deferred today                   FABER test: deferred today        Thigh-thrust test: deferred today       S-I compression test: deferred today       S-I distraction test: deferred today        Gait & Posture Assessment  Ambulation: Unassisted Gait: Relatively normal for age and body habitus Posture: WNL   Lower Extremity Exam    Side: Right lower extremity  Side: Left lower extremity  Stability: No instability observed          Stability: No instability observed  Skin & Extremity Inspection: Skin color, temperature, and hair growth are WNL. No peripheral edema or cyanosis. No masses, redness, swelling, asymmetry, or associated skin lesions. No contractures.  Skin & Extremity Inspection: Skin color, temperature, and hair growth are WNL. No peripheral edema or cyanosis. No masses, redness, swelling, asymmetry, or associated skin lesions. No contractures.  Functional ROM: Unrestricted ROM                  Functional ROM: Unrestricted ROM                  Muscle Tone/Strength: Functionally intact. No obvious neuro-muscular anomalies detected.  Muscle Tone/Strength: Functionally intact. No obvious neuro-muscular anomalies detected.  Sensory (Neurological): Unimpaired  Sensory (Neurological): Unimpaired  Palpation: No palpable anomalies  Palpation: No palpable anomalies   Assessment  Primary Diagnosis & Pertinent Problem List: The primary encounter diagnosis was Lumbar spondylosis. Diagnoses of Lumbar facet arthropathy and Lumbar degenerative disc disease were also pertinent to this visit.  Status Diagnosis  Having a Flare-up Having a Flare-up Persistent 1. Lumbar spondylosis   2. Lumbar facet arthropathy   3. Lumbar degenerative disc disease     General Recommendations: The pain condition that the patient suffers from is best treated with a multidisciplinary approach that involves an increase in physical activity to prevent de-conditioning and worsening of the pain cycle, as well as psychological counseling (formal and/or informal) to address  the co-morbid psychological affects of pain. Treatment will often involve judicious use of pain medications and interventional procedures to decrease the pain, allowing the patient to participate in the physical activity that will ultimately produce long-lasting pain reductions. The goal of the multidisciplinary approach is to return the patient to a higher level of overall function and to restore their ability to perform activities of daily living.  72 year old female with a history of morbid obesity, lumbar spondylosis, lumbar facet arthropathy who presents with axial low back and buttock pain that she describes similar in quality in nature to how it was prior to her ablation.  Patient states that over the last 2 to 3 weeks, she has noted a return of her pain in her low back and buttock regions.  She states that the radiofrequency ablation was effective in reducing her axial low back and buttock pain but now is returning although not to the same level as it was prior to her block.  We discussed repeating lumbar radiofrequency ablation starting with the left side first.  Patient had her left L3, L4, L5 RFA performed 06/09/2017 followed by her right side, L3, L4, L5 on 06/30/2017.  We discussed repeating radiofrequency ablation at left L3-L4-L5 first followed by her right side thereafter.  For her acute on chronic pain today, we discussed intramuscular Norflex and Toradol.  Patient's previous creatinine and GFR within normal limits.  Plan: -Repeat left L3, L4, L5 radiofrequency ablation followed by right with sedation for lumbar spondylosis, facet arthropathy -IM Toradol 30 mg, IM Norflex 30 mg today for acute on chronic back pain.  Plan of Care  Pharmacotherapy (Medications Ordered): Meds ordered this encounter  Medications  . ketorolac (TORADOL) 30 MG/ML injection 30 mg  . orphenadrine (NORFLEX) injection 30 mg   Lab-work, procedure(s), and/or referral(s): Orders Placed This Encounter  Procedures   . Radiofrequency,Lumbar   Time Note: Greater than 50% of the 25 minute(s) of face-to-face time spent with [name], was spent in counseling/coordination of care regarding: [name]'s primary cause of pain, the treatment  plan, treatment alternatives, the risks and possible complications of proposed treatment, realistic expectations, the goals of pain management (increased in functionality) and the need to bring and keep the BMI below 30.  Provider-requested follow-up: Return for Procedure.  Future Appointments  Date Time Provider Department Center  12/01/2017  8:00 AM Edward Jolly, MD [facility] None    Primary Care Physician: Lauro Regulus, MD Location: [facility] Note by: Edward Jolly, M.D Date: 11/09/2017; Time: 10:12 AM  Patient Instructions   GENERAL RISKS AND COMPLICATIONS  What are the risk, side effects and possible complications? Generally speaking, most procedures are safe.  However, with any procedure there are risks, side effects, and the possibility of complications.  The risks and complications are dependent upon the sites that are lesioned, or the type of nerve block to be performed.  The closer the procedure is to the spine, the more serious the risks are.  Great care is taken when placing the radio frequency needles, block needles or lesioning probes, but sometimes complications can occur. 1. Infection: Any time there is an injection through the skin, there is a risk of infection.  This is why sterile conditions are used for these blocks.  There are four possible types of infection. 1. Localized skin infection. 2. Central Nervous System Infection-This can be in the form of Meningitis, which can be deadly. 3. Epidural Infections-This can be in the form of an epidural abscess, which can cause pressure inside of the spine, causing compression of the spinal cord with subsequent paralysis. This would require an  emergency surgery to decompress, and there are no guarantees that the patient would recover from the paralysis. 4. Discitis-This is an infection of the intervertebral discs.  It occurs in about 1% of discography procedures.  It is difficult to treat and it may lead to surgery.        2. Pain: the needles have to go through skin and soft tissues, will cause soreness.       3. Damage to internal structures:  The nerves to be lesioned may be near blood vessels or    other nerves which can be potentially damaged.       4. Bleeding: Bleeding is more common if the patient is taking blood thinners such as  aspirin, Coumadin, Ticiid, Plavix, etc., or if he/she have some genetic predisposition  such as hemophilia. Bleeding into the spinal canal can cause compression of the spinal  cord with subsequent paralysis.  This would require an emergency surgery to  decompress and there are no guarantees that the patient would recover from the  paralysis.       5. Pneumothorax:  Puncturing of a lung is a possibility, every time a needle is introduced in  the area of the chest or upper back.  Pneumothorax refers to free air around the  collapsed lung(s), inside of the thoracic cavity (chest cavity).  Another two possible  complications related to a similar event would include: Hemothorax and Chylothorax.   These are variations of the Pneumothorax, where instead of air around the collapsed  lung(s), you may have blood or chyle, respectively.       6. Spinal headaches: They may occur with any procedures in the area of the spine.       7. Persistent CSF (Cerebro-Spinal Fluid) leakage: This is a rare problem, but may occur  with prolonged intrathecal or epidural catheters either due to the formation of a fistulous  track or  a dural tear.       8. Nerve damage: By working so close to the spinal cord, there is always a possibility of  nerve damage, which could be as serious as a permanent spinal cord injury with  paralysis.        9. Death:  Although rare, severe deadly allergic reactions known as "Anaphylactic  reaction" can occur to any of the medications used.      10. Worsening of the symptoms:  We can always make thing worse.  What are the chances of something like this happening? Chances of any of this occuring are extremely low.  By statistics, you have more of a chance of getting killed in a motor vehicle accident: while driving to the hospital than any of the above occurring .  Nevertheless, you should be aware that they are possibilities.  In general, it is similar to taking a shower.  Everybody knows that you can slip, hit your head and get killed.  Does that mean that you should not shower again?  Nevertheless always keep in mind that statistics do not mean anything if you happen to be on the wrong side of them.  Even if a procedure has a 1 (one) in a 1,000,000 (million) chance of going wrong, it you happen to be that one..Also, keep in mind that by statistics, you have more of a chance of having something go wrong when taking medications.  Who should not have this procedure? If you are on a blood thinning medication (e.g. Coumadin, Plavix, see list of "Blood Thinners"), or if you have an active infection going on, you should not have the procedure.  If you are taking any blood thinners, please inform your physician.  How should I prepare for this procedure?  Do not eat or drink anything at least six hours prior to the procedure.  Bring a driver with you .  It cannot be a taxi.  Come accompanied by an adult that can drive you back, and that is strong enough to help you if your legs get weak or numb from the local anesthetic.  Take all of your medicines the morning of the procedure with just enough water to swallow them.  If you have diabetes, make sure that you are scheduled to have your procedure done first thing in the morning, whenever possible.  If you have diabetes, take only half of your insulin dose and  notify our nurse that you have done so as soon as you arrive at the clinic.  If you are diabetic, but only take blood sugar pills (oral hypoglycemic), then do not take them on the morning of your procedure.  You may take them after you have had the procedure.  Do not take aspirin or any aspirin-containing medications, at least eleven (11) days prior to the procedure.  They may prolong bleeding.  Wear loose fitting clothing that may be easy to take off and that you would not mind if it got stained with Betadine or blood.  Do not wear any jewelry or perfume  Remove any nail coloring.  It will interfere with some of our monitoring equipment.  NOTE: Remember that this is not meant to be interpreted as a complete list of all possible complications.  Unforeseen problems may occur.  BLOOD THINNERS The following drugs contain aspirin or other products, which can cause increased bleeding during surgery and should not be taken for 2 weeks prior to and 1 week after surgery.  If  you should need take something for relief of minor pain, you may take acetaminophen which is found in Tylenol,m Datril, Anacin-3 and Panadol. It is not blood thinner. The products listed below are.  Do not take any of the products listed below in addition to any listed on your instruction sheet.  A.P.C or A.P.C with Codeine Codeine Phosphate Capsules #3 Ibuprofen Ridaura  ABC compound Congesprin Imuran rimadil  Advil Cope Indocin Robaxisal  Alka-Seltzer Effervescent Pain Reliever and Antacid Coricidin or Coricidin-D  Indomethacin Rufen  Alka-Seltzer plus Cold Medicine Cosprin Ketoprofen S-A-C Tablets  Anacin Analgesic Tablets or Capsules Coumadin Korlgesic Salflex  Anacin Extra Strength Analgesic tablets or capsules CP-2 Tablets Lanoril Salicylate  Anaprox Cuprimine Capsules Levenox Salocol  Anexsia-D Dalteparin Magan Salsalate  Anodynos Darvon compound Magnesium Salicylate Sine-off  Ansaid Dasin Capsules Magsal Sodium  Salicylate  Anturane Depen Capsules Marnal Soma  APF Arthritis pain formula Dewitt's Pills Measurin Stanback  Argesic Dia-Gesic Meclofenamic Sulfinpyrazone  Arthritis Bayer Timed Release Aspirin Diclofenac Meclomen Sulindac  Arthritis pain formula Anacin Dicumarol Medipren Supac  Analgesic (Safety coated) Arthralgen Diffunasal Mefanamic Suprofen  Arthritis Strength Bufferin Dihydrocodeine Mepro Compound Suprol  Arthropan liquid Dopirydamole Methcarbomol with Aspirin Synalgos  ASA tablets/Enseals Disalcid Micrainin Tagament  Ascriptin Doan's Midol Talwin  Ascriptin A/D Dolene Mobidin Tanderil  Ascriptin Extra Strength Dolobid Moblgesic Ticlid  Ascriptin with Codeine Doloprin or Doloprin with Codeine Momentum Tolectin  Asperbuf Duoprin Mono-gesic Trendar  Aspergum Duradyne Motrin or Motrin IB Triminicin  Aspirin plain, buffered or enteric coated Durasal Myochrisine Trigesic  Aspirin Suppositories Easprin Nalfon Trillsate  Aspirin with Codeine Ecotrin Regular or Extra Strength Naprosyn Uracel  Atromid-S Efficin Naproxen Ursinus  Auranofin Capsules Elmiron Neocylate Vanquish  Axotal Emagrin Norgesic Verin  Azathioprine Empirin or Empirin with Codeine Normiflo Vitamin E  Azolid Emprazil Nuprin Voltaren  Bayer Aspirin plain, buffered or children's or timed BC Tablets or powders Encaprin Orgaran Warfarin Sodium  Buff-a-Comp Enoxaparin Orudis Zorpin  Buff-a-Comp with Codeine Equegesic Os-Cal-Gesic   Buffaprin Excedrin plain, buffered or Extra Strength Oxalid   Bufferin Arthritis Strength Feldene Oxphenbutazone   Bufferin plain or Extra Strength Feldene Capsules Oxycodone with Aspirin   Bufferin with Codeine Fenoprofen Fenoprofen Pabalate or Pabalate-SF   Buffets II Flogesic Panagesic   Buffinol plain or Extra Strength Florinal or Florinal with Codeine Panwarfarin   Buf-Tabs Flurbiprofen Penicillamine   Butalbital Compound Four-way cold tablets Penicillin   Butazolidin Fragmin Pepto-Bismol    Carbenicillin Geminisyn Percodan   Carna Arthritis Reliever Geopen Persantine   Carprofen Gold's salt Persistin   Chloramphenicol Goody's Phenylbutazone   Chloromycetin Haltrain Piroxlcam   Clmetidine heparin Plaquenil   Cllnoril Hyco-pap Ponstel   Clofibrate Hydroxy chloroquine Propoxyphen         Before stopping any of these medications, be sure to consult the physician who ordered them.  Some, such as Coumadin (Warfarin) are ordered to prevent or treat serious conditions such as "deep thrombosis", "pumonary embolisms", and other heart problems.  The amount of time that you may need off of the medication may also vary with the medication and the reason for which you were taking it.  If you are taking any of these medications, please make sure you notify your pain physician before you undergo any procedures.

## 2017-11-09 NOTE — Progress Notes (Signed)
Safety precautions to be maintained throughout the outpatient stay will include: orient to surroundings, keep bed in low position, maintain call bell within reach at all times, provide assistance with transfer out of bed and ambulation.  

## 2017-12-01 ENCOUNTER — Other Ambulatory Visit: Payer: Self-pay

## 2017-12-01 ENCOUNTER — Ambulatory Visit
Admission: RE | Admit: 2017-12-01 | Discharge: 2017-12-01 | Disposition: A | Discharge status: Home / Self Care | Payer: Medicare Other | Source: Ambulatory Visit | Attending: Student in an Organized Health Care Education/Training Program | Admitting: Student in an Organized Health Care Education/Training Program

## 2017-12-01 ENCOUNTER — Ambulatory Visit (HOSPITAL_BASED_OUTPATIENT_CLINIC_OR_DEPARTMENT_OTHER): Payer: Medicare Other | Admitting: Student in an Organized Health Care Education/Training Program

## 2017-12-01 ENCOUNTER — Encounter: Payer: Self-pay | Admitting: Student in an Organized Health Care Education/Training Program

## 2017-12-01 VITALS — BP 137/67 | HR 71 | Temp 98.0°F | Resp 14 | Ht 66.0 in | Wt 289.0 lb

## 2017-12-01 DIAGNOSIS — G8929 Other chronic pain: Secondary | ICD-10-CM | POA: Insufficient documentation

## 2017-12-01 DIAGNOSIS — M47816 Spondylosis without myelopathy or radiculopathy, lumbar region: Secondary | ICD-10-CM | POA: Insufficient documentation

## 2017-12-01 DIAGNOSIS — Z79899 Other long term (current) drug therapy: Secondary | ICD-10-CM | POA: Diagnosis not present

## 2017-12-01 DIAGNOSIS — Z882 Allergy status to sulfonamides status: Secondary | ICD-10-CM | POA: Diagnosis not present

## 2017-12-01 DIAGNOSIS — M5136 Other intervertebral disc degeneration, lumbar region: Secondary | ICD-10-CM | POA: Diagnosis not present

## 2017-12-01 DIAGNOSIS — Z885 Allergy status to narcotic agent status: Secondary | ICD-10-CM | POA: Insufficient documentation

## 2017-12-01 DIAGNOSIS — Z888 Allergy status to other drugs, medicaments and biological substances status: Secondary | ICD-10-CM | POA: Insufficient documentation

## 2017-12-01 MED ORDER — FENTANYL CITRATE (PF) 100 MCG/2ML IJ SOLN
25.0000 ug | INTRAMUSCULAR | Status: DC | PRN
  Administered 2017-12-01: 50 ug via INTRAVENOUS
  Filled 2017-12-01: qty 2

## 2017-12-01 MED ORDER — LACTATED RINGERS IV SOLN
1000.0000 mL | Freq: Once | INTRAVENOUS | Status: AC
  Administered 2017-12-01: 1000 mL via INTRAVENOUS

## 2017-12-01 MED ORDER — LIDOCAINE HCL 1 % IJ SOLN
10.0000 mL | Freq: Once | INTRAMUSCULAR | Status: AC
  Administered 2017-12-01: 5 mL
  Filled 2017-12-01: qty 10

## 2017-12-01 MED ORDER — ROPIVACAINE HCL 2 MG/ML IJ SOLN
10.0000 mL | Freq: Once | INTRAMUSCULAR | Status: AC
  Administered 2017-12-01: 10 mL
  Filled 2017-12-01: qty 10

## 2017-12-01 MED ORDER — DEXAMETHASONE SODIUM PHOSPHATE 10 MG/ML IJ SOLN
10.0000 mg | Freq: Once | INTRAMUSCULAR | Status: AC
  Administered 2017-12-01: 10 mg
  Filled 2017-12-01: qty 1

## 2017-12-01 MED ORDER — LIDOCAINE HCL (PF) 1 % IJ SOLN
INTRAMUSCULAR | Status: AC
  Filled 2017-12-01: qty 5

## 2017-12-01 NOTE — Patient Instructions (Signed)
_____________________________________________________________________________________________  Post-Procedure Discharge Instructions  Instructions:  Apply ice: Fill a plastic sandwich bag with crushed ice. Cover it with a small towel and apply to injection site. Apply for 15 minutes then remove x 15 minutes. Repeat sequence on day of procedure, until you go to bed. The purpose is to minimize swelling and discomfort after procedure.  Apply heat: Apply heat to procedure site starting the day following the procedure. The purpose is to treat any soreness and discomfort from the procedure.  Food intake: Start with clear liquids (like water) and advance to regular food, as tolerated.   Physical activities: Keep activities to a minimum for the first 8 hours after the procedure.   Driving: If you have received any sedation, you are not allowed to drive for 24 hours after your procedure.  Blood thinner: Restart your blood thinner 6 hours after your procedure. (Only for those taking blood thinners)  Insulin: As soon as you can eat, you may resume your normal dosing schedule. (Only for those taking insulin)  Infection prevention: Keep procedure site clean and dry.  Post-procedure Pain Diary: Extremely important that this be done correctly and accurately. Recorded information will be used to determine the next step in treatment.  Pain evaluated is that of treated area only. Do not include pain from an untreated area.  Complete every hour, on the hour, for the initial 8 hours. Set an alarm to help you do this part accurately.  Do not go to sleep and have it completed later. It will not be accurate.  Follow-up appointment: Keep your follow-up appointment after the procedure. Usually 2 weeks for most procedures. (6 weeks in the case of radiofrequency.) Bring you pain diary.   Expect:  From numbing medicine (AKA: Local Anesthetics): Numbness or decrease in pain.  Onset: Full effect within 15  minutes of injected.  Duration: It will depend on the type of local anesthetic used. On the average, 1 to 8 hours.   From steroids: Decrease in swelling or inflammation. Once inflammation is improved, relief of the pain will follow.  Onset of benefits: Depends on the amount of swelling present. The more swelling, the longer it will take for the benefits to be seen. In some cases, up to 10 days.  Duration: Steroids will stay in the system x 2 weeks. Duration of benefits will depend on multiple posibilities including persistent irritating factors.  From procedure: Some discomfort is to be expected once the numbing medicine wears off. This should be minimal if ice and heat are applied as instructed.  Call if:  You experience numbness and weakness that gets worse with time, as opposed to wearing off.  New onset bowel or bladder incontinence. (This applies to Spinal procedures only)  Emergency Numbers:  Durning business hours (Monday - Thursday, 8:00 AM - 4:00 PM) (Friday, 9:00 AM - 12:00 Noon): [phone]  After hours: [phone] ____________________________________________________________________________________________  __________________________________________________________________________________________  Post-Radiofrequency (RF) Discharge Instructions  You have just completed a Radiofrequency Neurotomy.  The following instructions will provide you with information and guidelines for self-care upon discharge.  If at any time you have questions or concerns please call your physician. DO NOT DRIVE YOURSELF!!  Instructions:  Apply ice: Fill a plastic sandwich bag with crushed ice. Cover it with a small towel and apply to injection site. Apply for 15 minutes then remove x 15 minutes. Repeat sequence on day of procedure, until you go to bed. The purpose is to minimize swelling and discomfort after  procedure.  Apply heat: Apply heat to procedure site starting the day  following the procedure. The purpose is to treat any soreness and discomfort from the procedure.  Food intake: No eating limitations, unless stipulated above.  Nevertheless, if you have had sedation, you may experience some nausea.  In this case, it may be wise to wait at least two hours prior to resuming regular diet.  Physical activities: Keep activities to a minimum for the first 8 hours after the procedure. For the first 24 hours after the procedure, do not drive a motor vehicle,  Operate heavy machinery, power tools, or handle any weapons.  Consider walking with the use of an assistive device or accompanied by an adult for the first 24 hours.  Do not drink alcoholic beverages including beer.  Do not make any important decisions or sign any legal documents. Go home and rest today.  Resume activities tomorrow, as tolerated.  Use caution in moving about as you may experience mild leg weakness.  Use caution in cooking, use of household electrical appliances and climbing steps.  Driving: If you have received any sedation, you are not allowed to drive for 24 hours after your procedure.  Blood thinner: Restart your blood thinner 6 hours after your procedure. (Only for those taking blood thinners)  Insulin: As soon as you can eat, you may resume your normal dosing schedule. (Only for those taking insulin)  Medications: May resume pre-procedure medications.  Do not take any drugs, other than what has been prescribed to you.  Infection prevention: Keep procedure site clean and dry.  Post-procedure Pain Diary: Extremely important that this be done correctly and accurately. Recorded information will be used to determine the next step in treatment.  Pain evaluated is that of treated area only. Do not include pain from an untreated area.  Complete every hour, on the hour, for the initial 8 hours. Set an alarm to help you do this part accurately.  Do not go to sleep and have it completed later. It will  not be accurate.  Follow-up appointment: Keep your follow-up appointment after the procedure. Usually 2 weeks for most procedures. (6 weeks in the case of radiofrequency.) Bring you pain diary.   Expect:  From numbing medicine (AKA: Local Anesthetics): Numbness or decrease in pain.  Onset: Full effect within 15 minutes of injected.  Duration: It will depend on the type of local anesthetic used. On the average, 1 to 8 hours.   From steroids: Decrease in swelling or inflammation. Once inflammation is improved, relief of the pain will follow.  Onset of benefits: Depends on the amount of swelling present. The more swelling, the longer it will take for the benefits to be seen. In some cases, up to 10 days.  Duration: Steroids will stay in the system x 2 weeks. Duration of benefits will depend on multiple posibilities including persistent irritating factors.  From procedure: Some discomfort is to be expected once the numbing medicine wears off. This should be minimal if ice and heat are applied as instructed.  Call if:  You experience numbness and weakness that gets worse with time, as opposed to wearing off.  He experience any unusual bleeding, difficulty breathing, or loss of the ability to control your bowel and bladder. (This applies to Spinal procedures only)  You experience any redness, swelling, heat, red streaks, elevated temperature, fever, or any other signs of a possible infection.  Emergency Numbers:  Durning business hours (Monday - Thursday, 8:00 AM -  4:00 PM) (Friday, 9:00 AM - 12:00 Noon): [phone]  After hours: [phone] ____________________________________________________________________________________________

## 2017-12-01 NOTE — Progress Notes (Signed)
Patient's Name: [name]  MRN: [id number]  Referring Provider: Lauro Regulus, MD  DOB: Jul 08, 1945  PCP: Lauro Regulus, MD  DOS: 12/01/2017  Note by: Edward Jolly, MD  Service setting: Ambulatory outpatient  Specialty: Interventional Pain Management  Patient type: Established  Location: [facility] (AMB) Pain Management Facility  Visit type: Interventional Procedure   Primary Reason for Visit: Interventional Pain Management Treatment. CC: Back Pain (lower)  Procedure:       Anesthesia, Analgesia, Anxiolysis:  Type: Thermal Lumbar Facet, Medial Branch Radiofrequency Ablation/Neurotomy Level:L3, L4, L5,  Medial Branch Level(s). These levels will denervate the L4-5, and the L5-S1 lumbar facet joints. Primary Purpose: Therapeutic Region: Posterolateral Lumbosacral Spine Laterality: Left  Type: Moderate (Conscious) Sedation combined with Local Anesthesia Indication(s): Analgesia and Anxiety Route: Intravenous (IV) IV Access: Secured Sedation: Meaningful verbal contact was maintained at all times during the procedure  Local Anesthetic: Lidocaine 1%   Indications: 1. Lumbar spondylosis   2. Lumbar facet arthropathy   3. Lumbar degenerative disc disease    [name] has been dealing with the above chronic pain for longer than three months and has either failed to respond, was unable to tolerate, or simply did not get enough benefit from other more conservative therapies including, but not limited to: 1. Over-the-counter medications 2. Anti-inflammatory medications 3. Muscle relaxants 4. Membrane stabilizers 5. Opioids 6. Physical therapy 7. Modalities (Heat, ice, etc.) 8. Invasive techniques such as nerve blocks. [name] has attained more than 50% relief of the pain from a series of diagnostic injections conducted in separate occasions.  Pain Score: Pre-procedure: 0-No pain/10 Post-procedure: 0-No pain/10  Pre-op Assessment:    [name] is a 72 y.o. (year old), female patient, seen today for interventional treatment. She  has a past surgical history that includes Ankle reconstruction (Left); Brachioplasty; Cataract extraction; Cholecystectomy; Soft Tissue Tumor Resection; Abdominal hysterectomy (07/12/2014); Joint replacement (Bilateral, 07/2014, 03/2015); nasal reconstrucion; Toe Surgery (Right); Shoulder open rotator cuff repair; Colonoscopy with propofol (N/A, 12/07/2016); and Reduction mammaplasty (Bilateral). [name] has a current medication list which includes the following prescription(s): acetaminophen, amantadine, amlodipine, vitamin c, aspirin ec, azelastine, b complex vitamins, baclofen, benzonatate, carvedilol, celecoxib, vitamin d3, donepezil, esomeprazole, folic acid, levothyroxine, losartan, melatonin, montelukast, multivitamin, onabotulinumtoxina, potassium chloride sa, prednisone, torsemide, tramadol, trazodone, turmeric, venlafaxine, doxycycline, and tapentadol, and the following Facility-Administered Medications: fentanyl. Her primarily concern today is the Back Pain (lower)  Initial Vital Signs:  Pulse/HCG Rate: 86ECG Heart Rate: 79 Temp: 98.2 F (36.8 C) Resp: 18 BP: (!) 142/80 SpO2: 96 %  BMI: Estimated body mass index is 46.65 kg/m as calculated from the following:   Height as of this encounter: 5\' 6"  (1.676 m).   Weight as of this encounter: 289 lb (131.1 kg).  Risk Assessment: Allergies: Reviewed. She is allergic to codeine; hydromorphone; latex; oxycodone-acetaminophen; sulfa antibiotics; bupropion; gabapentin; hydrocodone; morphine and related; and procaine.  Allergy Precautions: None required Coagulopathies: Reviewed. None identified.  Blood-thinner therapy: None at this time Active Infection(s): Reviewed. None identified. [name] is afebrile  Site Confirmation: [name] was asked to confirm the procedure and laterality  before marking the site Procedure checklist: Completed Consent: Before the procedure and under the influence of no sedative(s), amnesic(s), or anxiolytics, the patient was informed of the treatment options, risks and possible complications. To fulfill our ethical and legal obligations, as recommended by the American Medical Association's Code of Ethics, I have informed the patient of my  clinical impression; the nature and purpose of the treatment or procedure; the risks, benefits, and possible complications of the intervention; the alternatives, including doing nothing; the risk(s) and benefit(s) of the alternative treatment(s) or procedure(s); and the risk(s) and benefit(s) of doing nothing. The patient was provided information about the general risks and possible complications associated with the procedure. These may include, but are not limited to: failure to achieve desired goals, infection, bleeding, organ or nerve damage, allergic reactions, paralysis, and death. In addition, the patient was informed of those risks and complications associated to Spine-related procedures, such as failure to decrease pain; infection (i.e.: Meningitis, epidural or intraspinal abscess); bleeding (i.e.: epidural hematoma, subarachnoid hemorrhage, or any other type of intraspinal or peri-dural bleeding); organ or nerve damage (i.e.: Any type of peripheral nerve, nerve root, or spinal cord injury) with subsequent damage to sensory, motor, and/or autonomic systems, resulting in permanent pain, numbness, and/or weakness of one or several areas of the body; allergic reactions; (i.e.: anaphylactic reaction); and/or death. Furthermore, the patient was informed of those risks and complications associated with the medications. These include, but are not limited to: allergic reactions (i.e.: anaphylactic or anaphylactoid reaction(s)); adrenal axis suppression; blood sugar elevation that in diabetics may result in ketoacidosis or comma;  water retention that in patients with history of congestive heart failure may result in shortness of breath, pulmonary edema, and decompensation with resultant heart failure; weight gain; swelling or edema; medication-induced neural toxicity; particulate matter embolism and blood vessel occlusion with resultant organ, and/or nervous system infarction; and/or aseptic necrosis of one or more joints. Finally, the patient was informed that Medicine is not an exact science; therefore, there is also the possibility of unforeseen or unpredictable risks and/or possible complications that may result in a catastrophic outcome. The patient indicated having understood very clearly. We have given the patient no guarantees and we have made no promises. Enough time was given to the patient to ask questions, all of which were answered to the patient's satisfaction. [name] has indicated that she wanted to continue with the procedure. Attestation: I, the ordering provider, attest that I have discussed with the patient the benefits, risks, side-effects, alternatives, likelihood of achieving goals, and potential problems during recovery for the procedure that I have provided informed consent. Date  Time: 12/01/2017  8:12 AM  Pre-Procedure Preparation:  Monitoring: As per clinic protocol. Respiration, ETCO2, SpO2, BP, heart rate and rhythm monitor placed and checked for adequate function Safety Precautions: Patient was assessed for positional comfort and pressure points before starting the procedure. Time-out: I initiated and conducted the "Time-out" before starting the procedure, as per protocol. The patient was asked to participate by confirming the accuracy of the "Time Out" information. Verification of the correct person, site, and procedure were performed and confirmed by me, the nursing staff, and the patient. "Time-out" conducted as per Joint Commission's Universal Protocol (UP.01.01.01). Time:  0837  Description of Procedure:       Position: Prone Laterality: Left Levels:   L3, L4, L5,  Medial Branch Level(s), at the L4-5, and the L5-S1 lumbar facet joints. Area Prepped: Lumbosacral Prepping solution: ChloraPrep (2% chlorhexidine gluconate and 70% isopropyl alcohol) Safety Precautions: Aspiration looking for blood return was conducted prior to all injections. At no point did we inject any substances, as a needle was being advanced. Before injecting, the patient was told to immediately notify me if she was experiencing any new onset of "ringing in the ears, or metallic taste in the  mouth". No attempts were made at seeking any paresthesias. Safe injection practices and needle disposal techniques used. Medications properly checked for expiration dates. SDV (single dose vial) medications used. After the completion of the procedure, all disposable equipment used was discarded in the proper designated medical waste containers. Local Anesthesia: Protocol guidelines were followed. The patient was positioned over the fluoroscopy table. The area was prepped in the usual manner. The time-out was completed. The target area was identified using fluoroscopy. A 12-in long, straight, sterile hemostat was used with fluoroscopic guidance to locate the targets for each level blocked. Once located, the skin was marked with an approved surgical skin marker. Once all sites were marked, the skin (epidermis, dermis, and hypodermis), as well as deeper tissues (fat, connective tissue and muscle) were infiltrated with a small amount of a short-acting local anesthetic, loaded on a 10cc syringe with a 25G, 1.5-in  Needle. An appropriate amount of time was allowed for local anesthetics to take effect before proceeding to the next step. Local Anesthetic: Lidocaine 1.0% The unused portion of the local anesthetic was discarded in the proper designated containers. Technical explanation of process:  Radiofrequency Ablation  (RFA)  L3 Medial Branch Nerve RFA: The target area for the L3 medial branch is at the junction of the postero-lateral aspect of the superior articular process and the superior, posterior, and medial edge of the transverse process of L4. Under fluoroscopic guidance, a Radiofrequency needle was inserted until contact was made with os over the superior postero-lateral aspect of the pedicular shadow (target area). Sensory and motor testing was conducted to properly adjust the position of the needle. Once satisfactory placement of the needle was achieved, the numbing solution was slowly injected after negative aspiration for blood. 1 mL of the nerve block solution was injected without difficulty or complication. After waiting for at least 3 minutes, the ablation was performed. Once completed, the needle was removed intact. L4 Medial Branch Nerve RFA: The target area for the L4 medial branch is at the junction of the postero-lateral aspect of the superior articular process and the superior, posterior, and medial edge of the transverse process of L5. Under fluoroscopic guidance, a Radiofrequency needle was inserted until contact was made with os over the superior postero-lateral aspect of the pedicular shadow (target area). Sensory and motor testing was conducted to properly adjust the position of the needle. Once satisfactory placement of the needle was achieved, the numbing solution was slowly injected after negative aspiration for blood. 1mL of the nerve block solution was injected without difficulty or complication. After waiting for at least 3 minutes, the ablation was performed. Once completed, the needle was removed intact. L5 Medial Branch Nerve RFA: The target area for the L5 medial branch is at the junction of the postero-lateral aspect of the superior articular process of S1 and the superior, posterior, and medial edge of the sacral ala. Under fluoroscopic guidance, a Radiofrequency needle was inserted until  contact was made with os over the superior postero-lateral aspect of the pedicular shadow (target area). Sensory and motor testing was conducted to properly adjust the position of the needle. Once satisfactory placement of the needle was achieved, the numbing solution was slowly injected after negative aspiration for blood. 1mL of the nerve block solution was injected without difficulty or complication. After waiting for at least 3 minutes, the ablation was performed. Once completed, the needle was removed intact.  Radiofrequency lesioning (ablation):  Radiofrequency Generator: NeuroTherm [id number] Sensory Stimulation Parameters: 50 Hz was  used to locate & identify the nerve, making sure that the needle was positioned such that there was no sensory stimulation below 0.3 V or above 0.7 V. Motor Stimulation Parameters: 2 Hz was used to evaluate the motor component. Care was taken not to lesion any nerves that demonstrated motor stimulation of the lower extremities at an output of less than 2.5 times that of the sensory threshold, or a maximum of 2.0 V. Lesioning Technique Parameters: Standard Radiofrequency settings. (Not bipolar or pulsed.) Temperature Settings: 80 degrees C Lesioning time: 60 seconds Intra-operative Compliance: Compliant Materials & Medications: Needle(s) (Electrode/Cannula) Type: Teflon-coated, curved tip, Radiofrequency needle(s) Gauge: 22G Length: 10cm Numbing solution: 5 cc solution of 4 cc of 0.2% Ropivacaine and 1 cc of Decadron 10mg /cc, 1.5 cc injected at each level above prior to ablation. The unused portion of the solution was discarded in the proper designated containers.  Once the entire procedure was completed, the treated area was cleaned, making sure to leave some of the prepping solution back to take advantage of its long term bactericidal properties.  Illustration of the posterior view of the lumbar spine and the posterior neural structures. Laminae of L2 through S1  are labeled. DPRL5, dorsal primary ramus of L5; DPRS1, dorsal primary ramus of S1; DPR3, dorsal primary ramus of L3; FJ, facet (zygapophyseal) joint L3-L4; I, inferior articular process of L4; LB1, lateral branch of dorsal primary ramus of L1; IAB, inferior articular branches from L3 medial branch (supplies L4-L5 facet joint); IBP, intermediate branch plexus; MB3, medial branch of dorsal primary ramus of L3; NR3, third lumbar nerve root; S, superior articular process of L5; SAB, superior articular branches from L4 (supplies L4-5 facet joint also); TP3, transverse process of L3.  Vitals:   12/01/17 0858 12/01/17 0908 12/01/17 0918 12/01/17 0928  BP: (!) 155/107 127/60 133/76 137/67  Pulse:  77 73 71  Resp: 13 13 16 14   Temp:  98.7 F (37.1 C)  98 F (36.7 C)  TempSrc:  Temporal  Tympanic  SpO2: 94% 99% 98% 100%  Weight:      Height:        Start Time: 0838 hrs. End Time: 0857 hrs.  Imaging Guidance (Spinal):  Type of Imaging Technique: Fluoroscopy Guidance (Spinal) Indication(s): Assistance in needle guidance and placement for procedures requiring needle placement in or near specific anatomical locations not easily accessible without such assistance. Exposure Time: Please see nurses notes. Contrast: None used. Fluoroscopic Guidance: I was personally present during the use of fluoroscopy. "Tunnel Vision Technique" used to obtain the best possible view of the target area. Parallax error corrected before commencing the procedure. "Direction-depth-direction" technique used to introduce the needle under continuous pulsed fluoroscopy. Once target was reached, antero-posterior, oblique, and lateral fluoroscopic projection used confirm needle placement in all planes. Images permanently stored in EMR. Interpretation: No contrast injected. I personally interpreted the imaging intraoperatively. Adequate needle placement confirmed in multiple planes. Permanent images saved into the patient's  record.  Antibiotic Prophylaxis:   Anti-infectives (From admission, onward)   None     Indication(s): None identified  Post-operative Assessment:  Post-procedure Vital Signs:  Pulse/HCG Rate: 7177 Temp: 98 F (36.7 C) Resp: 14 BP: 137/67 SpO2: 100 %  EBL: None  Complications: No immediate post-treatment complications observed by team, or reported by patient.  Note: The patient tolerated the entire procedure well. A repeat set of vitals were taken after the procedure and the patient was kept under observation following institutional policy, for this type of  procedure. Post-procedural neurological assessment was performed, showing return to baseline, prior to discharge. The patient was provided with post-procedure discharge instructions, including a section on how to identify potential problems. Should any problems arise concerning this procedure, the patient was given instructions to immediately contact us, at any time, without hesitation. In any case, we plan to contact the patient by telephone for a follow-up status report regarding this interventional procedure.  Comments:  No additional relevant information. 5 out of 5 strength bilateral lower extremity: Plantar flexion, dorsiflexion, knee flexion, knee extension.  Plan of Care   Imaging Orders     DG C-Arm 1-60 Min-No Report  Procedure Orders     Radiofrequency,Lumbar Follow up for RIGHT- L3,4,5 RFA  Medications ordered for procedure: Meds ordered this encounter  Medications  . lactated ringers infusion 1,000 mL  . fentaNYL (SUBLIMAZE) injection 25-100 mcg    Make sure Narcan is available in the pyxis when using this medication. In the event of respiratory depression (RR< 8/min): Titrate NARCAN (naloxone) in increments of 0.1 to 0.2 mg IV at 2-3 minute intervals, until desired degree of reversal.  . lidocaine (XYLOCAINE) 1 % (with pres) injection 10 mL  . ropivacaine (PF) 2 mg/mL (0.2%) (NAROPIN) injection 10 mL  .  dexamethasone (DECADRON) injection 10 mg   Medications administered: We administered lactated ringers, fentaNYL, lidocaine, ropivacaine (PF) 2 mg/mL (0.2%), and dexamethasone.  See the medical record for exact dosing, route, and time of administration.  New Prescriptions   No medications on file   Disposition: Discharge home  Discharge Date & Time: 12/01/2017; 0935 hrs.   Physician-requested Follow-up: Return in about 3 weeks (around 12/22/2017) for Procedure.  Future Appointments  Date Time Provider Department Center  12/22/2017  8:00 AM Edward JollyLateef, Yuki Purves, MD [facility] None   Primary Care Physician: Lauro RegulusAnderson, Marshall W, MD [facility] Note by: Edward JollyBilal Mlissa Tamayo, MD Date: 12/01/2017; Time: 11:44 AM  Disclaimer:  Medicine is not an exact science. The only guarantee in medicine is that nothing is guaranteed. It is important to note that the decision to proceed with this intervention was based on the information collected from the patient. The Data and conclusions were drawn from the patient's questionnaire, the interview, and the physical examination. Because the information was provided in large part by the patient, it cannot be guaranteed that it has not been purposely or unconsciously manipulated. Every effort has been made to obtain as much relevant data as possible for this evaluation. It is important to note that the conclusions that lead to this procedure are derived in large part from the available data. Always take into account that the treatment will also be dependent on availability of resources and existing treatment guidelines, considered by other Pain Management Practitioners as being common knowledge and practice, at the time of the intervention. For Medico-Legal purposes, it is also important to point out that variation in procedural techniques and pharmacological choices are the acceptable norm. The indications, contraindications, technique, and  results of the above procedure should only be interpreted and judged by a Board-Certified Interventional Pain Specialist with extensive familiarity and expertise in the same exact procedure and technique.

## 2017-12-01 NOTE — Progress Notes (Signed)
Safety precautions to be maintained throughout the outpatient stay will include: orient to surroundings, keep bed in low position, maintain call bell within reach at all times, provide assistance with transfer out of bed and ambulation.  

## 2017-12-02 ENCOUNTER — Telehealth: Payer: Self-pay

## 2017-12-02 NOTE — Telephone Encounter (Signed)
Post procedure phone call.  Patient states she is doing well.  

## 2017-12-22 ENCOUNTER — Ambulatory Visit
Admission: RE | Admit: 2017-12-22 | Discharge: 2017-12-22 | Disposition: A | Discharge status: Home / Self Care | Payer: Medicare Other | Source: Ambulatory Visit | Attending: Student in an Organized Health Care Education/Training Program | Admitting: Student in an Organized Health Care Education/Training Program

## 2017-12-22 ENCOUNTER — Ambulatory Visit (HOSPITAL_BASED_OUTPATIENT_CLINIC_OR_DEPARTMENT_OTHER): Payer: Medicare Other | Admitting: Student in an Organized Health Care Education/Training Program

## 2017-12-22 ENCOUNTER — Encounter: Payer: Self-pay | Admitting: Student in an Organized Health Care Education/Training Program

## 2017-12-22 VITALS — BP 128/62 | HR 83 | Temp 97.8°F | Resp 16 | Ht 66.0 in | Wt 285.0 lb

## 2017-12-22 DIAGNOSIS — Z79899 Other long term (current) drug therapy: Secondary | ICD-10-CM | POA: Insufficient documentation

## 2017-12-22 DIAGNOSIS — M47816 Spondylosis without myelopathy or radiculopathy, lumbar region: Secondary | ICD-10-CM | POA: Diagnosis not present

## 2017-12-22 DIAGNOSIS — Z882 Allergy status to sulfonamides status: Secondary | ICD-10-CM | POA: Diagnosis not present

## 2017-12-22 DIAGNOSIS — Z885 Allergy status to narcotic agent status: Secondary | ICD-10-CM | POA: Insufficient documentation

## 2017-12-22 DIAGNOSIS — G8929 Other chronic pain: Secondary | ICD-10-CM | POA: Diagnosis not present

## 2017-12-22 DIAGNOSIS — Z888 Allergy status to other drugs, medicaments and biological substances status: Secondary | ICD-10-CM | POA: Insufficient documentation

## 2017-12-22 DIAGNOSIS — Z7982 Long term (current) use of aspirin: Secondary | ICD-10-CM | POA: Diagnosis not present

## 2017-12-22 MED ORDER — DEXAMETHASONE SODIUM PHOSPHATE 10 MG/ML IJ SOLN
10.0000 mg | Freq: Once | INTRAMUSCULAR | Status: AC
  Administered 2017-12-22: 10 mg

## 2017-12-22 MED ORDER — DEXAMETHASONE SODIUM PHOSPHATE 10 MG/ML IJ SOLN
INTRAMUSCULAR | Status: AC
  Filled 2017-12-22: qty 1

## 2017-12-22 MED ORDER — FENTANYL CITRATE (PF) 100 MCG/2ML IJ SOLN
INTRAMUSCULAR | Status: AC
  Filled 2017-12-22: qty 2

## 2017-12-22 MED ORDER — ROPIVACAINE HCL 2 MG/ML IJ SOLN
10.0000 mL | Freq: Once | INTRAMUSCULAR | Status: AC
  Administered 2017-12-22: 10 mL

## 2017-12-22 MED ORDER — ROPIVACAINE HCL 2 MG/ML IJ SOLN
INTRAMUSCULAR | Status: AC
  Filled 2017-12-22: qty 10

## 2017-12-22 MED ORDER — LACTATED RINGERS IV SOLN
1000.0000 mL | Freq: Once | INTRAVENOUS | Status: AC
  Administered 2017-12-22: 1000 mL via INTRAVENOUS

## 2017-12-22 MED ORDER — LIDOCAINE HCL (PF) 1 % IJ SOLN
INTRAMUSCULAR | Status: AC
  Filled 2017-12-22: qty 5

## 2017-12-22 MED ORDER — FENTANYL CITRATE (PF) 100 MCG/2ML IJ SOLN
25.0000 ug | INTRAMUSCULAR | Status: DC | PRN
  Administered 2017-12-22: 50 ug via INTRAVENOUS

## 2017-12-22 MED ORDER — LIDOCAINE HCL 1 % IJ SOLN
10.0000 mL | Freq: Once | INTRAMUSCULAR | Status: AC
  Administered 2017-12-22: 5 mL
  Filled 2017-12-22: qty 10

## 2017-12-22 NOTE — Patient Instructions (Signed)

## 2017-12-22 NOTE — Progress Notes (Signed)
Patient's Name: [name]  MRN: [id number]  Referring Provider: Lauro RegulusAnderson, Marshall W, MD  DOB: 10/26/1945  PCP: Lauro RegulusAnderson, Marshall W, MD  DOS: 12/22/2017  Note by: Edward JollyBilal Athziri Freundlich, MD  Service setting: Ambulatory outpatient  Specialty: Interventional Pain Management  Patient type: Established  Location: [facility] (AMB) Pain Management Facility  Visit type: Interventional Procedure   Primary Reason for Visit: Interventional Pain Management Treatment. CC: Back Pain (lower right)  Procedure:       Anesthesia, Analgesia, Anxiolysis:  Type: Thermal Lumbar Facet, Medial Branch Radiofrequency Ablation/Neurotomy Level:L3, L4, L5,  Medial Branch Level(s). These levels will denervate the L4-5, and the L5-S1 lumbar facet joints. Primary Purpose: Therapeutic Region: Posterolateral Lumbosacral Spine Laterality: Right  Type: Moderate (Conscious) Sedation combined with Local Anesthesia Indication(s): Analgesia and Anxiety Route: Intravenous (IV) IV Access: Secured Sedation: Meaningful verbal contact was maintained at all times during the procedure  Local Anesthetic: Lidocaine 1%   Indications: 1. Lumbar spondylosis   2. Lumbar facet arthropathy    [name] has been dealing with the above chronic pain for longer than three months and has either failed to respond, was unable to tolerate, or simply did not get enough benefit from other more conservative therapies including, but not limited to: 1. Over-the-counter medications 2. Anti-inflammatory medications 3. Muscle relaxants 4. Membrane stabilizers 5. Opioids 6. Physical therapy 7. Modalities (Heat, ice, etc.) 8. Invasive techniques such as nerve blocks. [name] has attained more than 50% relief of the pain from a series of diagnostic injections conducted in separate occasions.  Pain Score: Pre-procedure: 3 /10 Post-procedure: 0-No pain/10  Pre-op Assessment:  Ms. Robin LaurenceHollifield [name] is a 72 y.o.  (year old), female patient, seen today for interventional treatment. She  has a past surgical history that includes Ankle reconstruction (Left); Brachioplasty; Cataract extraction; Cholecystectomy; Soft Tissue Tumor Resection; Abdominal hysterectomy (07/12/2014); Joint replacement (Bilateral, 07/2014, 03/2015); nasal reconstrucion; Toe Surgery (Right); Shoulder open rotator cuff repair; Colonoscopy with propofol (N/A, 12/07/2016); and Reduction mammaplasty (Bilateral). [name] has a current medication list which includes the following prescription(s): acetaminophen, amantadine, amlodipine, vitamin c, aspirin ec, azelastine, b complex vitamins, baclofen, carvedilol, celecoxib, vitamin d3, donepezil, esomeprazole, folic acid, levothyroxine, losartan, melatonin, montelukast, multivitamin, onabotulinumtoxina, potassium chloride sa, tapentadol, torsemide, trazodone, turmeric, venlafaxine, benzonatate, doxycycline, prednisone, and tramadol, and the following Facility-Administered Medications: fentanyl. Her primarily concern today is the Back Pain (lower right)  Initial Vital Signs:  Pulse/HCG Rate: 83ECG Heart Rate: 81 Temp: 98.4 F (36.9 C) Resp: 16 BP: (!) 145/73 SpO2: 98 %  BMI: Estimated body mass index is 46 kg/m as calculated from the following:   Height as of this encounter: 5\' 6"  (1.676 m).   Weight as of this encounter: 285 lb (129.3 kg).  Risk Assessment: Allergies: Reviewed. She is allergic to codeine; hydromorphone; latex; oxycodone-acetaminophen; sulfa antibiotics; bupropion; gabapentin; hydrocodone; morphine and related; and procaine.  Allergy Precautions: None required Coagulopathies: Reviewed. None identified.  Blood-thinner therapy: None at this time Active Infection(s): Reviewed. None identified. [name] is afebrile  Site Confirmation: [name] was asked to confirm the procedure and laterality before marking the site Procedure  checklist: Completed Consent: Before the procedure and under the influence of no sedative(s), amnesic(s), or anxiolytics, the patient was informed of the treatment options, risks and possible complications. To fulfill our ethical and legal obligations, as recommended by the American Medical Association's Code of Ethics, I have informed the patient of my clinical impression; the nature and purpose  of the treatment or procedure; the risks, benefits, and possible complications of the intervention; the alternatives, including doing nothing; the risk(s) and benefit(s) of the alternative treatment(s) or procedure(s); and the risk(s) and benefit(s) of doing nothing. The patient was provided information about the general risks and possible complications associated with the procedure. These may include, but are not limited to: failure to achieve desired goals, infection, bleeding, organ or nerve damage, allergic reactions, paralysis, and death. In addition, the patient was informed of those risks and complications associated to Spine-related procedures, such as failure to decrease pain; infection (i.e.: Meningitis, epidural or intraspinal abscess); bleeding (i.e.: epidural hematoma, subarachnoid hemorrhage, or any other type of intraspinal or peri-dural bleeding); organ or nerve damage (i.e.: Any type of peripheral nerve, nerve root, or spinal cord injury) with subsequent damage to sensory, motor, and/or autonomic systems, resulting in permanent pain, numbness, and/or weakness of one or several areas of the body; allergic reactions; (i.e.: anaphylactic reaction); and/or death. Furthermore, the patient was informed of those risks and complications associated with the medications. These include, but are not limited to: allergic reactions (i.e.: anaphylactic or anaphylactoid reaction(s)); adrenal axis suppression; blood sugar elevation that in diabetics may result in ketoacidosis or comma; water retention that in patients  with history of congestive heart failure may result in shortness of breath, pulmonary edema, and decompensation with resultant heart failure; weight gain; swelling or edema; medication-induced neural toxicity; particulate matter embolism and blood vessel occlusion with resultant organ, and/or nervous system infarction; and/or aseptic necrosis of one or more joints. Finally, the patient was informed that Medicine is not an exact science; therefore, there is also the possibility of unforeseen or unpredictable risks and/or possible complications that may result in a catastrophic outcome. The patient indicated having understood very clearly. We have given the patient no guarantees and we have made no promises. Enough time was given to the patient to ask questions, all of which were answered to the patient's satisfaction. [name] has indicated that she wanted to continue with the procedure. Attestation: I, the ordering provider, attest that I have discussed with the patient the benefits, risks, side-effects, alternatives, likelihood of achieving goals, and potential problems during recovery for the procedure that I have provided informed consent.   Pre-Procedure Preparation:  Monitoring: As per clinic protocol. Respiration, ETCO2, SpO2, BP, heart rate and rhythm monitor placed and checked for adequate function Safety Precautions: Patient was assessed for positional comfort and pressure points before starting the procedure. Time-out: I initiated and conducted the "Time-out" before starting the procedure, as per protocol. The patient was asked to participate by confirming the accuracy of the "Time Out" information. Verification of the correct person, site, and procedure were performed and confirmed by me, the nursing staff, and the patient. "Time-out" conducted as per Joint Commission's Universal Protocol (UP.01.01.01). Time: 0838  Description of Procedure:       Position: Prone Laterality:  Right Levels:   L3, L4, L5,  Medial Branch Level(s), at the L4-5, and the L5-S1 lumbar facet joints. Area Prepped: Lumbosacral Prepping solution: ChloraPrep (2% chlorhexidine gluconate and 70% isopropyl alcohol) Safety Precautions: Aspiration looking for blood return was conducted prior to all injections. At no point did we inject any substances, as a needle was being advanced. Before injecting, the patient was told to immediately notify me if she was experiencing any new onset of "ringing in the ears, or metallic taste in the mouth". No attempts were made at seeking any paresthesias. Safe injection practices  and needle disposal techniques used. Medications properly checked for expiration dates. SDV (single dose vial) medications used. After the completion of the procedure, all disposable equipment used was discarded in the proper designated medical waste containers. Local Anesthesia: Protocol guidelines were followed. The patient was positioned over the fluoroscopy table. The area was prepped in the usual manner. The time-out was completed. The target area was identified using fluoroscopy. A 12-in long, straight, sterile hemostat was used with fluoroscopic guidance to locate the targets for each level blocked. Once located, the skin was marked with an approved surgical skin marker. Once all sites were marked, the skin (epidermis, dermis, and hypodermis), as well as deeper tissues (fat, connective tissue and muscle) were infiltrated with a small amount of a short-acting local anesthetic, loaded on a 10cc syringe with a 25G, 1.5-in  Needle. An appropriate amount of time was allowed for local anesthetics to take effect before proceeding to the next step. Local Anesthetic: Lidocaine 1.0% The unused portion of the local anesthetic was discarded in the proper designated containers. Technical explanation of process:  Radiofrequency Ablation (RFA)  L3 Medial Branch Nerve RFA: The target area for the L3 medial  branch is at the junction of the postero-lateral aspect of the superior articular process and the superior, posterior, and medial edge of the transverse process of L4. Under fluoroscopic guidance, a Radiofrequency needle was inserted until contact was made with os over the superior postero-lateral aspect of the pedicular shadow (target area). Sensory and motor testing was conducted to properly adjust the position of the needle. Once satisfactory placement of the needle was achieved, the numbing solution was slowly injected after negative aspiration for blood. 1 mL of the nerve block solution was injected without difficulty or complication. After waiting for at least 3 minutes, the ablation was performed. Once completed, the needle was removed intact. L4 Medial Branch Nerve RFA: The target area for the L4 medial branch is at the junction of the postero-lateral aspect of the superior articular process and the superior, posterior, and medial edge of the transverse process of L5. Under fluoroscopic guidance, a Radiofrequency needle was inserted until contact was made with os over the superior postero-lateral aspect of the pedicular shadow (target area). Sensory and motor testing was conducted to properly adjust the position of the needle. Once satisfactory placement of the needle was achieved, the numbing solution was slowly injected after negative aspiration for blood. 1mL of the nerve block solution was injected without difficulty or complication. After waiting for at least 3 minutes, the ablation was performed. Once completed, the needle was removed intact. L5 Medial Branch Nerve RFA: The target area for the L5 medial branch is at the junction of the postero-lateral aspect of the superior articular process of S1 and the superior, posterior, and medial edge of the sacral ala. Under fluoroscopic guidance, a Radiofrequency needle was inserted until contact was made with os over the superior postero-lateral aspect of  the pedicular shadow (target area). Sensory and motor testing was conducted to properly adjust the position of the needle. Once satisfactory placement of the needle was achieved, the numbing solution was slowly injected after negative aspiration for blood. 1mL of the nerve block solution was injected without difficulty or complication. After waiting for at least 3 minutes, the ablation was performed. Once completed, the needle was removed intact.  Radiofrequency lesioning (ablation):  Radiofrequency Generator: NeuroTherm [id number] Sensory Stimulation Parameters: 50 Hz was used to locate & identify the nerve, making sure that the needle  was positioned such that there was no sensory stimulation below 0.3 V or above 0.7 V. Motor Stimulation Parameters: 2 Hz was used to evaluate the motor component. Care was taken not to lesion any nerves that demonstrated motor stimulation of the lower extremities at an output of less than 2.5 times that of the sensory threshold, or a maximum of 2.0 V. Lesioning Technique Parameters: Standard Radiofrequency settings. (Not bipolar or pulsed.) Temperature Settings: 80 degrees C Lesioning time: 60 seconds Intra-operative Compliance: Compliant Materials & Medications: Needle(s) (Electrode/Cannula) Type: Teflon-coated, curved tip, Radiofrequency needle(s) Gauge: 22G Length: 10cm Numbing solution: 5 cc solution of 4 cc of 0.2% Ropivacaine and 1 cc of Decadron 10mg /cc, 1.5 cc injected at each level above prior to ablation. The unused portion of the solution was discarded in the proper designated containers.  Once the entire procedure was completed, the treated area was cleaned, making sure to leave some of the prepping solution back to take advantage of its long term bactericidal properties.  Illustration of the posterior view of the lumbar spine and the posterior neural structures. Laminae of L2 through S1 are labeled. DPRL5, dorsal primary ramus of L5; DPRS1, dorsal primary  ramus of S1; DPR3, dorsal primary ramus of L3; FJ, facet (zygapophyseal) joint L3-L4; I, inferior articular process of L4; LB1, lateral branch of dorsal primary ramus of L1; IAB, inferior articular branches from L3 medial branch (supplies L4-L5 facet joint); IBP, intermediate branch plexus; MB3, medial branch of dorsal primary ramus of L3; NR3, third lumbar nerve root; S, superior articular process of L5; SAB, superior articular branches from L4 (supplies L4-5 facet joint also); TP3, transverse process of L3.  Vitals:   12/22/17 0902 12/22/17 0911 12/22/17 0923 12/22/17 0932  BP: 133/62 138/76 (!) 123/48 128/62  Pulse:      Resp: 13 14 17 16   Temp:  97.7 F (36.5 C)  97.8 F (36.6 C)  TempSrc:      SpO2: 97% 97% 92% 99%  Weight:      Height:        Start Time: 0839 hrs. End Time: 0901 hrs.  Imaging Guidance (Spinal):  Type of Imaging Technique: Fluoroscopy Guidance (Spinal) Indication(s): Assistance in needle guidance and placement for procedures requiring needle placement in or near specific anatomical locations not easily accessible without such assistance. Exposure Time: Please see nurses notes. Contrast: None used. Fluoroscopic Guidance: I was personally present during the use of fluoroscopy. "Tunnel Vision Technique" used to obtain the best possible view of the target area. Parallax error corrected before commencing the procedure. "Direction-depth-direction" technique used to introduce the needle under continuous pulsed fluoroscopy. Once target was reached, antero-posterior, oblique, and lateral fluoroscopic projection used confirm needle placement in all planes. Images permanently stored in EMR. Interpretation: No contrast injected. I personally interpreted the imaging intraoperatively. Adequate needle placement confirmed in multiple planes. Permanent images saved into the patient's record.  Antibiotic Prophylaxis:   Anti-infectives (From admission, onward)   None      Indication(s): None identified  Post-operative Assessment:  Post-procedure Vital Signs:  Pulse/HCG Rate: 8375 Temp: 97.8 F (36.6 C) Resp: 16 BP: 128/62 SpO2: 99 %  EBL: None  Complications: No immediate post-treatment complications observed by team, or reported by patient.  Note: The patient tolerated the entire procedure well. A repeat set of vitals were taken after the procedure and the patient was kept under observation following institutional policy, for this type of procedure. Post-procedural neurological assessment was performed, showing return to baseline, prior to  discharge. The patient was provided with post-procedure discharge instructions, including a section on how to identify potential problems. Should any problems arise concerning this procedure, the patient was given instructions to immediately contact us, at any time, without hesitation. In any case, we plan to contact the patient by telephone for a follow-up status report regarding this interventional procedure.  Comments:  No additional relevant information. 5 out of 5 strength bilateral lower extremity: Plantar flexion, dorsiflexion, knee flexion, knee extension.  Plan of Care   Imaging Orders     DG C-Arm 1-60 Min-No Report Procedure Orders    No procedure(s) ordered today     Medications ordered for procedure: Meds ordered this encounter  Medications  . lactated ringers infusion 1,000 mL  . fentaNYL (SUBLIMAZE) injection 25-100 mcg    Make sure Narcan is available in the pyxis when using this medication. In the event of respiratory depression (RR< 8/min): Titrate NARCAN (naloxone) in increments of 0.1 to 0.2 mg IV at 2-3 minute intervals, until desired degree of reversal.  . lidocaine (XYLOCAINE) 1 % (with pres) injection 10 mL  . ropivacaine (PF) 2 mg/mL (0.2%) (NAROPIN) injection 10 mL  . dexamethasone (DECADRON) injection 10 mg   Medications administered: We administered lactated ringers,  fentaNYL, lidocaine, ropivacaine (PF) 2 mg/mL (0.2%), and dexamethasone.  See the medical record for exact dosing, route, and time of administration.  New Prescriptions   No medications on file   Disposition: Discharge home  Discharge Date & Time: 12/22/2017; 0933 hrs.   Physician-requested Follow-up: Return in about 1 month (around 01/19/2018) for Post Procedure Evaluation.  Future Appointments  Date Time Provider Department Center  01/19/2018 12:45 PM Edward Jolly, MD [facility] None   Primary Care Physician: Lauro Regulus, MD Location: [facility] Note by: Edward Jolly, MD Date: 12/22/2017; Time: 12:54 PM  Disclaimer:  Medicine is not an exact science. The only guarantee in medicine is that nothing is guaranteed. It is important to note that the decision to proceed with this intervention was based on the information collected from the patient. The Data and conclusions were drawn from the patient's questionnaire, the interview, and the physical examination. Because the information was provided in large part by the patient, it cannot be guaranteed that it has not been purposely or unconsciously manipulated. Every effort has been made to obtain as much relevant data as possible for this evaluation. It is important to note that the conclusions that lead to this procedure are derived in large part from the available data. Always take into account that the treatment will also be dependent on availability of resources and existing treatment guidelines, considered by other Pain Management Practitioners as being common knowledge and practice, at the time of the intervention. For Medico-Legal purposes, it is also important to point out that variation in procedural techniques and pharmacological choices are the acceptable norm. The indications, contraindications, technique, and results of the above procedure should only be interpreted and judged by a Board-Certified  Interventional Pain Specialist with extensive familiarity and expertise in the same exact procedure and technique.

## 2017-12-23 ENCOUNTER — Telehealth: Payer: Self-pay

## 2017-12-23 NOTE — Telephone Encounter (Signed)
Post procedure phone call. Patient states she is doing good.  

## 2018-01-19 ENCOUNTER — Other Ambulatory Visit: Payer: Self-pay

## 2018-01-19 ENCOUNTER — Encounter: Payer: Self-pay | Admitting: Student in an Organized Health Care Education/Training Program

## 2018-01-19 ENCOUNTER — Ambulatory Visit
Payer: Medicare Other | Attending: Student in an Organized Health Care Education/Training Program | Admitting: Student in an Organized Health Care Education/Training Program

## 2018-01-19 VITALS — BP 138/76 | HR 81 | Temp 98.0°F | Resp 18 | Ht 66.0 in | Wt 290.0 lb

## 2018-01-19 DIAGNOSIS — E876 Hypokalemia: Secondary | ICD-10-CM | POA: Insufficient documentation

## 2018-01-19 DIAGNOSIS — G4730 Sleep apnea, unspecified: Secondary | ICD-10-CM | POA: Diagnosis not present

## 2018-01-19 DIAGNOSIS — M47816 Spondylosis without myelopathy or radiculopathy, lumbar region: Secondary | ICD-10-CM | POA: Diagnosis not present

## 2018-01-19 DIAGNOSIS — E039 Hypothyroidism, unspecified: Secondary | ICD-10-CM | POA: Insufficient documentation

## 2018-01-19 DIAGNOSIS — K219 Gastro-esophageal reflux disease without esophagitis: Secondary | ICD-10-CM | POA: Insufficient documentation

## 2018-01-19 DIAGNOSIS — M5136 Other intervertebral disc degeneration, lumbar region: Secondary | ICD-10-CM | POA: Diagnosis not present

## 2018-01-19 DIAGNOSIS — M62830 Muscle spasm of back: Secondary | ICD-10-CM | POA: Diagnosis not present

## 2018-01-19 DIAGNOSIS — I10 Essential (primary) hypertension: Secondary | ICD-10-CM | POA: Insufficient documentation

## 2018-01-19 DIAGNOSIS — M1990 Unspecified osteoarthritis, unspecified site: Secondary | ICD-10-CM | POA: Insufficient documentation

## 2018-01-19 DIAGNOSIS — Z7982 Long term (current) use of aspirin: Secondary | ICD-10-CM | POA: Diagnosis not present

## 2018-01-19 DIAGNOSIS — Z79899 Other long term (current) drug therapy: Secondary | ICD-10-CM | POA: Insufficient documentation

## 2018-01-19 DIAGNOSIS — N3281 Overactive bladder: Secondary | ICD-10-CM | POA: Insufficient documentation

## 2018-01-19 DIAGNOSIS — Z79890 Hormone replacement therapy (postmenopausal): Secondary | ICD-10-CM | POA: Diagnosis not present

## 2018-01-19 DIAGNOSIS — F329 Major depressive disorder, single episode, unspecified: Secondary | ICD-10-CM | POA: Diagnosis not present

## 2018-01-19 DIAGNOSIS — E785 Hyperlipidemia, unspecified: Secondary | ICD-10-CM | POA: Diagnosis not present

## 2018-01-19 DIAGNOSIS — K589 Irritable bowel syndrome without diarrhea: Secondary | ICD-10-CM | POA: Insufficient documentation

## 2018-01-19 DIAGNOSIS — M545 Low back pain: Secondary | ICD-10-CM | POA: Diagnosis present

## 2018-01-19 MED ORDER — TIZANIDINE HCL 4 MG PO TABS: 2.0000 mg | ORAL_TABLET | Freq: Two times a day (BID) | ORAL | 1 refills | Status: AC | PRN

## 2018-01-19 NOTE — Patient Instructions (Addendum)
Zanaflex sent to pharmacy  

## 2018-01-19 NOTE — Progress Notes (Signed)
Safety precautions to be maintained throughout the outpatient stay will include: orient to surroundings, keep bed in low position, maintain call bell within reach at all times, provide assistance with transfer out of bed and ambulation.  

## 2018-01-19 NOTE — Progress Notes (Signed)
Patient's Name: [name]  MRN: [id number]  Referring Provider: Kirk Ruths, MD  DOB: 05-06-1946  PCP: Kirk Ruths, MD  DOS: 01/19/2018  Note by: Gillis Santa, MD  Service setting: Ambulatory outpatient  Specialty: Interventional Pain Management  Location: [facility] (AMB) Pain Management Facility    Patient type: Established   Primary Reason(s) for Visit: Encounter for post-procedure evaluation of chronic illness with mild to moderate exacerbation CC: Back Pain (low)  HPI  [name] is a 72 y.o. year old, female patient, who comes today for a post-procedure evaluation. She has Lumbar spondylosis; Lumbar facet arthropathy; and Lumbar degenerative disc disease on their problem list. Her primarily concern today is the Back Pain (low)  Pain Assessment: Location: Lower Back Radiating: denies Onset: More than a month ago Duration: Chronic pain Quality: Aching, Constant Severity: 3 /10 (subjective, self-reported pain score)  Note: Reported level is compatible with observation.                         When using our objective Pain Scale, levels between 6 and 10/10 are said to belong in an emergency room, as it progressively worsens from a 6/10, described as severely limiting, requiring emergency care not usually available at an outpatient pain management facility. At a 6/10 level, communication becomes difficult and requires great effort. Assistance to reach the emergency department may be required. Facial flushing and profuse sweating along with potentially dangerous increases in heart rate and blood pressure will be evident. Effect on ADL:   Timing: Constant Modifying factors: procedures, ES tylenol, Nucynta BP: 138/76  HR: 81  [name] comes in today for post-procedure evaluation after the treatment done on 12/23/2017.  Further details on both, my assessment(s), as well as the proposed treatment plan, please see  below.  Post-Procedure Assessment  12/22/2017 Procedure: R L3,4,5 RFA Pre-procedure pain score:  3/10 Post-procedure pain score: 0/10         Influential Factors: BMI: 46.81 kg/m Intra-procedural challenges: None observed.         Assessment challenges: None detected.              Reported side-effects: None.        Post-procedural adverse reactions or complications: None reported         Sedation: Please see nurses note. When no sedatives are used, the analgesic levels obtained are directly associated to the effectiveness of the local anesthetics. However, when sedation is provided, the level of analgesia obtained during the initial 1 hour following the intervention, is believed to be the result of a combination of factors. These factors may include, but are not limited to: 1. The effectiveness of the local anesthetics used. 2. The effects of the analgesic(s) and/or anxiolytic(s) used. 3. The degree of discomfort experienced by the patient at the time of the procedure. 4. The patients ability and reliability in recalling and recording the events. 5. The presence and influence of possible secondary gains and/or psychosocial factors. Reported result: Relief experienced during the 1st hour after the procedure: 100 % (Ultra-Short Term Relief)            Interpretative annotation: Clinically appropriate result. Analgesia during this period is likely to be Local Anesthetic and/or IV Sedative (Analgesic/Anxiolytic) related.          Effects of local anesthetic: The analgesic effects attained during this period are directly associated to the localized infiltration of local anesthetics and therefore cary  significant diagnostic value as to the etiological location, or anatomical origin, of the pain. Expected duration of relief is directly dependent on the pharmacodynamics of the local anesthetic used. Long-acting (4-6 hours) anesthetics used.  Reported result: Relief during the next 4 to 6 hour after  the procedure: 100 % (Short-Term Relief)            Interpretative annotation: Clinically appropriate result. Analgesia during this period is likely to be Local Anesthetic-related.          Long-term benefit: Defined as the period of time past the expected duration of local anesthetics (1 hour for short-acting and 4-6 hours for long-acting). With the possible exception of prolonged sympathetic blockade from the local anesthetics, benefits during this period are typically attributed to, or associated with, other factors such as analgesic sensory neuropraxia, antiinflammatory effects, or beneficial biochemical changes provided by agents other than the local anesthetics.  Reported result: Extended relief following procedure: 50 %(for about a week) (Long-Term Relief)            Interpretative annotation: Clinically possible results. Good relief. No permanent benefit expected. Inflammation plays a part in the etiology to the pain.          Current benefits: Defined as reported results that persistent at this point in time.   Analgesia: 50-75 %            Function: Somewhat improved ROM: Somewhat improved Interpretative annotation: Ongoing benefit. Therapeutic benefit observed. Effective therapeutic approach.          Interpretation: Results would suggest a successful diagnostic and therapeutic intervention.                  Plan:  Please see "Plan of Care" for details.                Laboratory Chemistry  Inflammation Markers (CRP: Acute Phase) (ESR: Chronic Phase) No results found for: CRP, ESRSEDRATE, LATICACIDVEN                       Rheumatology Markers No results found for: RF, ANA, LABURIC, URICUR, LYMEIGGIGMAB, LYMEABIGMQN, HLAB27                      Renal Function Markers No results found for: BUN, CREATININE, BCR, GFRAA, GFRNONAA                           Hepatic Function Markers No results found for: AST, ALT, ALBUMIN, ALKPHOS, HCVAB, AMYLASE, LIPASE, AMMONIA                       Electrolytes No results found for: NA, K, CL, CALCIUM, MG, PHOS                      Neuropathy Markers No results found for: VITAMINB12, FOLATE, HGBA1C, HIV                      Bone Pathology Markers No results found for: VD25OH, [id number], [id number], TD1761YW7, 25OHVITD1, 25OHVITD2, 25OHVITD3, TESTOFREE, TESTOSTERONE                       Coagulation Parameters No results found for: INR, LABPROT, APTT, PLT, DDIMER                      Cardiovascular Markers  No results found for: BNP, CKTOTAL, CKMB, TROPONINI, HGB, HCT                       CA Markers No results found for: CEA, CA125, LABCA2                      Note: Lab results reviewed.  Recent Diagnostic Imaging Results  DG C-Arm 1-60 Min-No Report Fluoroscopy was utilized by the requesting physician.  No radiographic  interpretation.   Complexity Note: Imaging results reviewed. Results shared with [name], using Layman's terms.                         Meds   Current Outpatient Medications:  .  acetaminophen (TYLENOL) 500 MG tablet, Take 500 mg by mouth every 6 (six) hours as needed., Disp: , Rfl:  .  amantadine (SYMMETREL) 100 MG capsule, Take 100 mg by mouth 2 (two) times daily., Disp: , Rfl:  .  amLODipine (NORVASC) 5 MG tablet, Take 5 mg by mouth daily., Disp: , Rfl:  .  Ascorbic Acid (VITAMIN C) 1000 MG tablet, Take 1,000 mg daily by mouth., Disp: , Rfl:  .  aspirin EC 81 MG tablet, Take 81 mg by mouth daily., Disp: , Rfl:  .  azelastine (ASTELIN) 0.1 % nasal spray, 2 sprays as needed for rhinitis. Use in each nostril as directed, Disp: , Rfl:  .  B Complex Vitamins (VITAMIN-B COMPLEX PO), Take 100 mg daily by mouth., Disp: , Rfl:  .  baclofen (LIORESAL) 20 MG tablet, Take 20 mg by mouth daily as needed for muscle spasms., Disp: , Rfl:  .  benzonatate (TESSALON) 100 MG capsule, Take 1 capsule (100 mg total) by mouth 3 (three) times daily as needed., Disp: 21 capsule, Rfl: 0 .  carvedilol  (COREG) 25 MG tablet, Take 25 mg 4 (four) times daily by mouth. , Disp: , Rfl:  .  celecoxib (CELEBREX) 200 MG capsule, Take 200 mg by mouth 2 (two) times daily., Disp: , Rfl:  .  Cholecalciferol (VITAMIN D3) 2000 units TABS, Take 2,000 Units daily by mouth., Disp: , Rfl:  .  donepezil (ARICEPT) 10 MG tablet, Take 10 mg by mouth at bedtime., Disp: , Rfl:  .  doxycycline (VIBRA-TABS) 100 MG tablet, Take 1 tablet (100 mg total) by mouth 2 (two) times daily., Disp: 14 tablet, Rfl: 0 .  esomeprazole (NEXIUM) 40 MG capsule, Take 40 mg by mouth daily., Disp: , Rfl:  .  folic acid (FOLVITE) 191 MCG tablet, Take 400 mcg by mouth 2 (two) times daily., Disp: , Rfl:  .  levothyroxine (SYNTHROID, LEVOTHROID) 112 MCG tablet, Take 112 mcg by mouth daily., Disp: , Rfl:  .  losartan (COZAAR) 100 MG tablet, Take 100 mg by mouth daily., Disp: , Rfl:  .  Melatonin 3 MG TABS, Take 1 tablet at bedtime by mouth. , Disp: , Rfl:  .  montelukast (SINGULAIR) 10 MG tablet, Take 10 mg by mouth every evening., Disp: , Rfl:  .  Multiple Vitamin (MULTIVITAMIN) tablet, Take 1 tablet by mouth daily., Disp: , Rfl:  .  OnabotulinumtoxinA (BOTOX IJ), Inject every 4 (four) months as directed., Disp: , Rfl:  .  potassium chloride SA (K-DUR,KLOR-CON) 20 MEQ tablet, Take 20 mEq by mouth 3 (three) times daily., Disp: , Rfl:  .  predniSONE (STERAPRED UNI-PAK 21 TAB) 5 MG (21) TBPK tablet, Take 5  mg by mouth daily., Disp: , Rfl:  .  tapentadol (NUCYNTA) 50 MG tablet, Take 50 mg by mouth as needed. , Disp: , Rfl:  .  torsemide (DEMADEX) 10 MG tablet, Take 10 mg daily by mouth., Disp: , Rfl:  .  traMADol (ULTRAM) 50 MG tablet, Take 50 mg by mouth every 12 (twelve) hours as needed. , Disp: , Rfl:  .  traZODone (DESYREL) 50 MG tablet, Take by mouth at bedtime. 50-175m nightly as needed, Disp: , Rfl:  .  Turmeric 500 MG TABS, Take 500 mg by mouth. 2 tablets one time per day, Disp: , Rfl:  .  venlafaxine (EFFEXOR) 75 MG tablet, Take 75 mg by  mouth 3 (three) times daily., Disp: , Rfl:  .  tiZANidine (ZANAFLEX) 4 MG tablet, Take 0.5 tablets (2 mg total) by mouth 2 (two) times daily as needed for muscle spasms., Disp: 60 tablet, Rfl: 1  ROS  Constitutional: Denies any fever or chills Gastrointestinal: No reported hemesis, hematochezia, vomiting, or acute GI distress Musculoskeletal: Denies any acute onset joint swelling, redness, loss of ROM, or weakness Neurological: No reported episodes of acute onset apraxia, aphasia, dysarthria, agnosia, amnesia, paralysis, loss of coordination, or loss of consciousness  Allergies  Ms. Massimo is allergic to codeine; hydromorphone; latex; oxycodone-acetaminophen; sulfa antibiotics; bupropion; gabapentin; hydrocodone; morphine and related; and procaine.  [place]: Ms. HGeralynn [name]  reports that she does not use drugs. Alcohol:  reports that she drinks about 1.2 oz of alcohol per week. Tobacco:  reports that she has never smoked. She has never used smokeless tobacco. 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 Surgical: Ms. HGeralynn [name]  has a past surgical history that includes Ankle reconstruction (Left); Brachioplasty; Cataract extraction; Cholecystectomy; Soft Tissue Tumor Resection; Abdominal hysterectomy (07/12/2014); Joint replacement (Bilateral, 07/2014, 03/2015); nasal reconstrucion; Toe Surgery (Right); Shoulder open rotator cuff repair; Colonoscopy with propofol (N/A, 12/07/2016); and Reduction mammaplasty (Bilateral). Family: family history includes Breast cancer in her paternal aunt.  Constitutional Exam   General appearance: Well nourished, well developed, and well hydrated. In no apparent acute distress Vitals:   01/19/18 1320  BP: 138/76  Pulse: 81  Resp: 18  Temp: 98 F (36.7 C)  TempSrc: Oral  SpO2: 98%  Weight: 290 lb (131.5 kg)  Height: 5' 6" (1.676 m)   BMI Assessment: Estimated body mass index is 46.81 kg/m as calculated from the following:   Height as of this encounter: 5' 6" (1.676 m).   Weight as of this encounter: 290 lb (131.5 kg).  BMI interpretation table: BMI level Category Range association with higher incidence of chronic pain  <18 kg/m2 Underweight   18.5-24.9 kg/m2 Ideal body weight   25-29.9 kg/m2 Overweight Increased incidence by 20%  30-34.9 kg/m2 Obese (Class I) Increased incidence by 68%  35-39.9 kg/m2 Severe obesity (Class II) Increased incidence by 136%  >40 kg/m2 Extreme obesity (Class III) Increased incidence by 254%   Patient's current BMI Ideal Body weight  Body mass index is 46.81 kg/m. Ideal body weight: 59.3 kg (130 lb 11.7 oz) Adjusted ideal body weight: 88.2 kg (194 lb 7 oz)   BMI Readings from Last 4 Encounters:  01/19/18 46.81 kg/m  12/22/17 46.00 kg/m  12/01/17 46.65 kg/m  11/09/17  46.42 kg/m   Wt Readings from Last 4 Encounters:  01/19/18 290 lb (131.5 kg)  12/22/17 285 lb (129.3 kg)  12/01/17 289 lb (131.1 kg)  11/09/17 292 lb (132.5 kg)  Psych/Mental status: Alert, oriented x 3 (person, place, & time)       Eyes: PERLA Respiratory: No evidence of acute respiratory distress  Cervical Spine Area Exam  Skin & Axial Inspection: No masses, redness, edema, swelling, or associated skin lesions Alignment: Symmetrical Functional ROM: Unrestricted ROM      Stability: No instability detected Muscle Tone/Strength: Functionally intact. No obvious neuro-muscular anomalies detected. Sensory (Neurological): Unimpaired Palpation: No palpable anomalies              Upper Extremity (UE) Exam    Side: Right upper extremity  Side:  Left upper extremity  Skin & Extremity Inspection: Skin color, temperature, and hair growth are WNL. No peripheral edema or cyanosis. No masses, redness, swelling, asymmetry, or associated skin lesions. No contractures.  Skin & Extremity Inspection: Skin color, temperature, and hair growth are WNL. No peripheral edema or cyanosis. No masses, redness, swelling, asymmetry, or associated skin lesions. No contractures.  Functional ROM: Unrestricted ROM          Functional ROM: Unrestricted ROM          Muscle Tone/Strength: Functionally intact. No obvious neuro-muscular anomalies detected.  Muscle Tone/Strength: Functionally intact. No obvious neuro-muscular anomalies detected.  Sensory (Neurological): Unimpaired          Sensory (Neurological): Unimpaired          Palpation: No palpable anomalies              Palpation: No palpable anomalies              Provocative Test(s):  Phalen's test: deferred Tinel's test: deferred Apley's scratch test (touch opposite shoulder):  Action 1 (Across chest): deferred Action 2 (Overhead): deferred Action 3 (LB reach): deferred   Provocative Test(s):  Phalen's test: deferred Tinel's test: deferred Apley's scratch test (touch opposite shoulder):  Action 1 (Across chest): deferred Action 2 (Overhead): deferred Action 3 (LB reach): deferred    Thoracic Spine Area Exam  Skin & Axial Inspection: No masses, redness, or swelling Alignment: Symmetrical Functional ROM: Unrestricted ROM Stability: No instability detected Muscle Tone/Strength: Functionally intact. No obvious neuro-muscular anomalies detected. Sensory (Neurological): Unimpaired Muscle strength & Tone: No palpable anomalies   Lumbar Spine Area Exam  Skin & Axial Inspection: No masses, redness, or swelling Alignment: Symmetrical Functional ROM: Decreased ROM affecting both sides Stability: No instability detected Muscle Tone/Strength: Functionally intact. No obvious neuro-muscular anomalies  detected. Sensory (Neurological): Articular pain pattern Palpation: No palpable anomalies       Provocative Tests: Lumbar Hyperextension/rotation test: Positive bilaterally for facet joint pain. Lumbar quadrant test (Kemp's test): (+) bilaterally for facet joint pain. Lumbar Lateral bending test: (+) due to pain. Patrick's Maneuver: deferred today                   FABER test: deferred today       Thigh-thrust test: deferred today       S-I compression test: deferred today       S-I distraction test: deferred today         Gait & Posture Assessment  Ambulation: Unassisted Gait: Relatively normal for age and body habitus Posture: WNL   Lower Extremity Exam    Side: Right lower extremity  Side: Left lower extremity  Stability: No instability observed  Stability: No instability observed          Skin & Extremity Inspection: Skin color, temperature, and hair growth are WNL. No peripheral edema or cyanosis. No masses, redness, swelling, asymmetry, or associated skin lesions. No contractures.  Skin & Extremity Inspection: Skin color, temperature, and hair growth are WNL. No peripheral edema or cyanosis. No masses, redness, swelling, asymmetry, or associated skin lesions. No contractures.  Functional ROM: Unrestricted ROM                  Functional ROM: Unrestricted ROM                  Muscle Tone/Strength: Functionally intact. No obvious neuro-muscular anomalies detected.  Muscle Tone/Strength: Functionally intact. No obvious neuro-muscular anomalies detected.  Sensory (Neurological): Unimpaired  Sensory (Neurological): Unimpaired  Palpation: No palpable anomalies  Palpation: No palpable anomalies   Assessment  Primary Diagnosis & Pertinent Problem List: The primary encounter diagnosis was Lumbar spondylosis. Diagnoses of Lumbar facet arthropathy, Lumbar degenerative disc disease, and Spasm of lumbar paraspinous muscle were also pertinent to this visit.  Status Diagnosis   Controlled Controlled Controlled 1. Lumbar spondylosis   2. Lumbar facet arthropathy   3. Lumbar degenerative disc disease   4. Spasm of lumbar paraspinous muscle      72 year old female with a history of axial low back pain secondary lumbar degenerative disc disease, lumbar spondylosis, lumbar facet arthropathy who is status post left L3, L4, L5 radio frequency ablation on 12/01/2017 followed by right radiofrequency ablation at L3, L4, L5 on 12-22-2017.  Patient endorses greater benefit on her left side compared to her right side.  She states that she is experiencing almost 80 to 90% pain relief on her left side and about 50% on her right side.  She is also endorsing of lumbar paraspinal muscle spasms that started after she twisted in an awkward position a couple of days ago.  Denies any new onset lower extremely weakness or bowel bladder dysfunction.  Overall does note improvement in her axial low back pain but just feels that the right side is continuing to bother her more so than the left side.  In regards to her lumbar paraspinal spasms.  I recommended heat packs and warm compresses and I will prescribe her tizanidine which she can take at night when she has severe muscle spasms.  I recommended the patient not take tizanidine or baclofen and also informed her that it could cause sedation.  Plan of Care  Pharmacotherapy (Medications Ordered): Meds ordered this encounter  Medications  . tiZANidine (ZANAFLEX) 4 MG tablet    Sig: Take 0.5 tablets (2 mg total) by mouth 2 (two) times daily as needed for muscle spasms.    Dispense:  60 tablet    Refill:  1   Provider-requested follow-up: Return in about 6 weeks (around 03/02/2018) for Medication Management.  Time Note: Greater than 50% of the 25 minute(s) of face-to-face time spent with [name], was spent in counseling/coordination of care regarding: [name]'s primary cause of pain, the treatment plan,  treatment alternatives, medication side effects, the results, interpretation and significance of  her recent diagnostic interventional treatment(s), the appropriate use of her medications, realistic expectations, the goals of pain management (increased in functionality) and the need to bring and keep the BMI below 30.  Future Appointments  Date Time Provider [place]  03/01/2018  8:45 AM Gillis Santa, MD [facility] None    Primary Care Physician: Ouida Sills,  Ocie Cornfield, MD Location: [facility] Note by: Gillis Santa, M.D Date: 01/19/2018; Time: 2:28 PM  Patient Instructions  Zanaflex sent to pharmacy.

## 2018-01-20 ENCOUNTER — Telehealth: Payer: Self-pay

## 2018-01-20 NOTE — Telephone Encounter (Signed)
Pt was called and message was left.  °

## 2018-03-01 ENCOUNTER — Encounter: Payer: Self-pay | Admitting: Student in an Organized Health Care Education/Training Program

## 2018-03-01 ENCOUNTER — Other Ambulatory Visit: Payer: Self-pay

## 2018-03-01 ENCOUNTER — Ambulatory Visit
Payer: Medicare Other | Attending: Student in an Organized Health Care Education/Training Program | Admitting: Student in an Organized Health Care Education/Training Program

## 2018-03-01 VITALS — BP 152/63 | HR 63 | Temp 98.3°F | Resp 18 | Ht 66.0 in | Wt 285.0 lb

## 2018-03-01 DIAGNOSIS — N3281 Overactive bladder: Secondary | ICD-10-CM | POA: Insufficient documentation

## 2018-03-01 DIAGNOSIS — K219 Gastro-esophageal reflux disease without esophagitis: Secondary | ICD-10-CM | POA: Insufficient documentation

## 2018-03-01 DIAGNOSIS — M62830 Muscle spasm of back: Secondary | ICD-10-CM | POA: Diagnosis not present

## 2018-03-01 DIAGNOSIS — E039 Hypothyroidism, unspecified: Secondary | ICD-10-CM | POA: Diagnosis not present

## 2018-03-01 DIAGNOSIS — M47816 Spondylosis without myelopathy or radiculopathy, lumbar region: Secondary | ICD-10-CM | POA: Diagnosis not present

## 2018-03-01 DIAGNOSIS — Z79890 Hormone replacement therapy (postmenopausal): Secondary | ICD-10-CM | POA: Insufficient documentation

## 2018-03-01 DIAGNOSIS — E785 Hyperlipidemia, unspecified: Secondary | ICD-10-CM | POA: Diagnosis not present

## 2018-03-01 DIAGNOSIS — M48061 Spinal stenosis, lumbar region without neurogenic claudication: Secondary | ICD-10-CM | POA: Insufficient documentation

## 2018-03-01 DIAGNOSIS — G4730 Sleep apnea, unspecified: Secondary | ICD-10-CM | POA: Insufficient documentation

## 2018-03-01 DIAGNOSIS — Z79899 Other long term (current) drug therapy: Secondary | ICD-10-CM | POA: Insufficient documentation

## 2018-03-01 DIAGNOSIS — F329 Major depressive disorder, single episode, unspecified: Secondary | ICD-10-CM | POA: Insufficient documentation

## 2018-03-01 DIAGNOSIS — M5136 Other intervertebral disc degeneration, lumbar region: Secondary | ICD-10-CM | POA: Insufficient documentation

## 2018-03-01 DIAGNOSIS — K589 Irritable bowel syndrome without diarrhea: Secondary | ICD-10-CM | POA: Insufficient documentation

## 2018-03-01 DIAGNOSIS — Z7982 Long term (current) use of aspirin: Secondary | ICD-10-CM | POA: Insufficient documentation

## 2018-03-01 DIAGNOSIS — I10 Essential (primary) hypertension: Secondary | ICD-10-CM | POA: Insufficient documentation

## 2018-03-01 DIAGNOSIS — G8929 Other chronic pain: Secondary | ICD-10-CM | POA: Insufficient documentation

## 2018-03-01 MED ORDER — TAPENTADOL HCL 50 MG PO TABS: 50.0000 mg | ORAL_TABLET | Freq: Every day | ORAL | 0 refills | Status: AC | PRN

## 2018-03-01 MED ORDER — TAPENTADOL HCL 50 MG PO TABS: 50.0000 mg | ORAL_TABLET | Freq: Every day | ORAL | 0 refills | Status: DC | PRN

## 2018-03-01 NOTE — Progress Notes (Signed)
Patient's Name: [name]  MRN: [id number]  Referring Provider: Kirk Ruths, MD  DOB: 04-Mar-1946  PCP: Kirk Ruths, MD  DOS: 03/01/2018  Note by: Gillis Santa, MD  Service setting: Ambulatory outpatient  Specialty: Interventional Pain Management  Location: [facility] (AMB) Pain Management Facility    Patient type: Established   Primary Reason(s) for Visit: Encounter for evaluation before starting new chronic pain management plan of care: Prescription for Nucynta (Level of risk: moderate) CC: Follow-up  HPI  [name] is a 72 y.o. year old, female patient, who comes today for a follow-up evaluation to review the test results and decide on a treatment plan. She has Lumbar spondylosis; Lumbar facet arthropathy; and Lumbar degenerative disc disease on their problem list. Her primarily concern today is the Follow-up  Pain Assessment: Location: Lower, Right, Left("Left side is worse") Back Radiating: Denies  Onset: More than a month ago Duration: Chronic pain Quality: Constant, Aching Severity: 4 /10 (subjective, self-reported pain score)  Note: Reported level is compatible with observation.                         When using our objective Pain Scale, levels between 6 and 10/10 are said to belong in an emergency room, as it progressively worsens from a 6/10, described as severely limiting, requiring emergency care not usually available at an outpatient pain management facility. At a 6/10 level, communication becomes difficult and requires great effort. Assistance to reach the emergency department may be required. Facial flushing and profuse sweating along with potentially dangerous increases in heart rate and blood pressure will be evident. Effect on ADL: "Unable to ben dover, unable to lift much of anything"  Timing: Constant Modifying factors: Procedures, ES Tylenol, Nucynta  BP: (!) 152/63  HR: 63  [name] comes in today for a  follow-up visit after her initial evaluation on 01/20/2018. Today we went over the results of her tests. These were explained in "Layman's terms". During today's appointment we went over my diagnostic impression, as well as the proposed treatment plan.  Patient follows up status post left and right lumbar RFA performed on 12/01/2017 and 12/22/2017.  Patient reports pain relief and improvement in range of motion after the RFA but not to the same extent as her first series which were done in December 2018 and January 2019.  Patient states that she has improvement in her lumbar flexion but not to the point where she is able to touch her toes which she could after her first ablation.  She is requesting Nucynta which she takes 50 mg daily from an old prescription that she had.  In considering the treatment plan options, [name] was reminded that I no longer take patients for medication management only. I asked her to let me know if she had no intention of taking advantage of the interventional therapies, so that we could make arrangements to provide this space to someone interested. I also made it clear that undergoing interventional therapies for the purpose of getting pain medications is very inappropriate on the part of a patient, and it will not be tolerated in this practice. This type of behavior would suggest true addiction and therefore it requires referral to an addiction specialist.   Further details on both, my assessment(s), as well as the proposed treatment plan, please see below.  Controlled Substance Pharmacotherapy Assessment REMS (Risk Evaluation and Mitigation Strategy)  Analgesic: Nucynta 50 mg  daily MME/day: Less than 30 mg/day. Pill Count: None expected due to no prior prescriptions written by our practice. Janne Napoleon, RN  03/01/2018  9:08 AM  Sign at close encounter Safety precautions to be maintained throughout the outpatient stay will include: orient to  surroundings, keep bed in low position, maintain call bell within reach at all times, provide assistance with transfer out of bed and ambulation.    Pharmacokinetics: Liberation and absorption (onset of action): WNL Distribution (time to peak effect): WNL Metabolism and excretion (duration of action): WNL         Pharmacodynamics: Desired effects: Analgesia: [name] reports >50% benefit. Functional ability: Patient reports that medication allows her to accomplish basic ADLs Clinically meaningful improvement in function (CMIF): Sustained CMIF goals met Perceived effectiveness: Described as relatively effective, allowing for increase in activities of daily living (ADL) Undesirable effects: Side-effects or Adverse reactions: None reported Monitoring: [place] PMP: Online review of the past 35-monthperiod previously conducted. Not applicable at this point since we have not taken over the patient's medication management yet. List of other Serum/Urine Drug Screening Test(s):  No results found for: AMPHSCRSER, BARBSCRSER, BENZOSCRSER, COCAINSCRSER, COCAINSCRNUR, PCameron TDiamond TAddison [place] PFort [place] ERenovoList of all UDS test(s) done:  No results found for: TOXASSSELUR, SUMMARY Last UDS on record: No results found for: TOXASSSELUR, SUMMARY UDS interpretation: No unexpected findings.          Medication Assessment Form: Patient introduced to form today Treatment compliance: Treatment may start today if patient agrees with proposed plan. Evaluation of compliance is not applicable at this point Risk Assessment Profile: Aberrant behavior: See initial evaluations. None observed or detected today Comorbid factors increasing risk of overdose: See initial evaluation. No additional risks detected today Opioid risk tool (ORT) (Total Score): 2 Personal History of Substance Abuse (SUD-Substance use disorder):  Alcohol: Alcohol: Negative  Illegal Drugs:  Illegal Drugs: Negative  Rx Drugs: Rx Drugs: Negative  ORT Risk Level calculation: Opioid Risk Interpretation: Low Risk Risk of substance use disorder (SUD): Low Opioid Risk Tool - 03/01/18 0908      Family History of Substance Abuse   Alcohol  Positive Female    Illegal Drugs  Negative    Rx Drugs  Negative      Personal History of Substance Abuse   Alcohol  Negative    Illegal Drugs  Negative    Rx Drugs  Negative      Age   Age between 148-45years   No      History of Preadolescent Sexual Abuse   History of Preadolescent Sexual Abuse  Negative or Female      Psychological Disease   Psychological Disease  Negative    Depression  Positive      Total Score   Opioid Risk Tool Scoring  2    Opioid Risk Interpretation  Low Risk      ORT Scoring interpretation table:  Score <3 = Low Risk for SUD  Score between 4-7 = Moderate Risk for SUD  Score >8 = High Risk for Opioid Abuse   Risk Mitigation Strategies:  Patient opioid safety counseling: Completed today. Counseling provided to patient as per "Patient Counseling Document". Document signed by patient, attesting to counseling and understanding Patient-Prescriber Agreement (PPA): Obtained today.  Controlled substance notification to other providers: Written and sent today.  Pharmacologic Plan: Today we may be taking over the patient's pharmacological regimen. See below.  Laboratory Chemistry  Inflammation Markers (CRP: Acute Phase) (ESR: Chronic Phase) No results found for: CRP, ESRSEDRATE, LATICACIDVEN                       Rheumatology Markers No results found for: RF, ANA, LABURIC, URICUR, LYMEIGGIGMAB, LYMEABIGMQN, HLAB27                      Renal Function Markers No results found for: BUN, CREATININE, BCR, GFRAA, GFRNONAA                           Hepatic Function Markers No results found for: AST, ALT, ALBUMIN, ALKPHOS, HCVAB, AMYLASE, LIPASE, AMMONIA                      Electrolytes No results  found for: NA, K, CL, CALCIUM, MG, PHOS                      Neuropathy Markers No results found for: VITAMINB12, FOLATE, HGBA1C, HIV                      CNS Tests No results found for: COLORCSF, APPEARCSF, RBCCOUNTCSF, WBCCSF, POLYSCSF, LYMPHSCSF, EOSCSF, PROTEINCSF, GLUCCSF, JCVIRUS, CSFOLI, IGGCSF                      Bone Pathology Markers No results found for: VD25OH, [id number], [id number], [id number], 25OHVITD1, 25OHVITD2, 25OHVITD3, TESTOFREE, TESTOSTERONE                       Coagulation Parameters No results found for: INR, LABPROT, APTT, PLT, DDIMER                      Cardiovascular Markers No results found for: BNP, CKTOTAL, CKMB, TROPONINI, HGB, HCT                       CA Markers No results found for: CEA, CA125, LABCA2                      Note: Lab results reviewed.  Recent Diagnostic Imaging Review    Lumbosacral Imaging: Lumbar MR wo contrast:  Results for orders placed during the hospital encounter of 10/28/16  MR LUMBAR SPINE WO CONTRAST   Narrative CLINICAL DATA:  L4-5 spinal stenosis. Chronic low back and left leg pain.  EXAM: MRI LUMBAR SPINE WITHOUT CONTRAST  TECHNIQUE: Multiplanar, multisequence MR imaging of the lumbar spine was performed. No intravenous contrast was administered.  COMPARISON:  None.  FINDINGS: Segmentation: Normal lumbar segmentation is assumed, with the lowest fully formed disc space designated L5-S1.  Alignment: Mild lumbar dextroscoliosis with apex at L3. Trace retrolisthesis of L1 on L2.  Vertebrae: Preserved vertebral body heights without evidence of fracture. 1.2 x 1.0 cm T1 hypointense, T2 hyperintense cystic focus in the posterior left T12 vertebral body and pedicle has a chronic appearance with surrounding fatty marrow change and no significant edema, indeterminate but possibly an intraosseous ganglion or meningeal cyst. Advanced disc degeneration is present at L1-2 greater than L2-3 with type 2 endplate  changes at both levels and mild type 1 change at L1-2.  Conus medullaris: Extends to the L1 level and appears normal.  Paraspinal and other soft tissues: Asymmetric left renal atrophy. Partially visualized 3.9 cm cyst in  the right pelvis/adnexa.  Disc levels:  T11-12: Only imaged sagittally. Disc desiccation and moderate disc space narrowing. Minimal disc bulging without stenosis.  T12-L1: Disc desiccation and mild disc space narrowing. Minimal disc bulging without stenosis.  L1-2: Disc desiccation and severe disc space narrowing. Disc bulging results in mild left lateral recess stenosis without spinal or neural foraminal stenosis.  L2-3: Disc desiccation and moderate left-sided disc space narrowing. Mild disc bulging without significant stenosis.  L3-4: Disc desiccation and mild disc space narrowing. Mild disc bulging without stenosis.  L4-5:  Mild facet arthrosis without disc herniation or stenosis.  L5-S1: Moderate facet arthrosis and at most minimal disc bulging without stenosis. Right facet joint effusion.  IMPRESSION: 1. Lumbar dextroscoliosis. 2. Advanced disc degeneration at L1-2 with mild left lateral recess stenosis. 3. Disc degeneration elsewhere as above without significant stenosis. 4. Moderate L5-S1 facet arthrosis. 5. 3.9 cm right adnexal cyst. Recommend pelvic ultrasound for further evaluation.   Electronically Signed   By: Logan Bores M.D.   On: 10/28/2016 17:29      Complexity Note: Imaging results reviewed. Results shared with [name], using Layman's terms.                         Meds   Current Outpatient Medications:  .  acetaminophen (TYLENOL) 500 MG tablet, Take 500 mg by mouth every 6 (six) hours as needed., Disp: , Rfl:  .  amantadine (SYMMETREL) 100 MG capsule, Take 100 mg by mouth 2 (two) times daily., Disp: , Rfl:  .  amLODipine (NORVASC) 5 MG tablet, Take 5 mg by mouth daily., Disp: , Rfl:  .  Ascorbic Acid  (VITAMIN C) 1000 MG tablet, Take 1,000 mg daily by mouth., Disp: , Rfl:  .  aspirin EC 81 MG tablet, Take 81 mg by mouth daily., Disp: , Rfl:  .  azelastine (ASTELIN) 0.1 % nasal spray, 2 sprays as needed for rhinitis. Use in each nostril as directed, Disp: , Rfl:  .  B Complex Vitamins (VITAMIN-B COMPLEX PO), Take 100 mg daily by mouth., Disp: , Rfl:  .  baclofen (LIORESAL) 20 MG tablet, Take 20 mg by mouth daily as needed for muscle spasms., Disp: , Rfl:  .  benzonatate (TESSALON) 100 MG capsule, Take 1 capsule (100 mg total) by mouth 3 (three) times daily as needed., Disp: 21 capsule, Rfl: 0 .  carvedilol (COREG) 25 MG tablet, Take 25 mg 4 (four) times daily by mouth. , Disp: , Rfl:  .  celecoxib (CELEBREX) 200 MG capsule, Take 200 mg by mouth 2 (two) times daily., Disp: , Rfl:  .  Cholecalciferol (VITAMIN D3) 2000 units TABS, Take 2,000 Units daily by mouth., Disp: , Rfl:  .  donepezil (ARICEPT) 10 MG tablet, Take 10 mg by mouth at bedtime., Disp: , Rfl:  .  doxycycline (VIBRA-TABS) 100 MG tablet, Take 1 tablet (100 mg total) by mouth 2 (two) times daily., Disp: 14 tablet, Rfl: 0 .  esomeprazole (NEXIUM) 40 MG capsule, Take 40 mg by mouth daily., Disp: , Rfl:  .  folic acid (FOLVITE) 588 MCG tablet, Take 400 mcg by mouth 2 (two) times daily., Disp: , Rfl:  .  levothyroxine (SYNTHROID, LEVOTHROID) 112 MCG tablet, Take 112 mcg by mouth daily., Disp: , Rfl:  .  losartan (COZAAR) 100 MG tablet, Take 100 mg by mouth daily., Disp: , Rfl:  .  Melatonin 3 MG TABS, Take 1 tablet at bedtime by  mouth. , Disp: , Rfl:  .  montelukast (SINGULAIR) 10 MG tablet, Take 10 mg by mouth every evening., Disp: , Rfl:  .  Multiple Vitamin (MULTIVITAMIN) tablet, Take 1 tablet by mouth daily., Disp: , Rfl:  .  OnabotulinumtoxinA (BOTOX IJ), Inject every 4 (four) months as directed., Disp: , Rfl:  .  potassium chloride SA (K-DUR,KLOR-CON) 20 MEQ tablet, Take 20 mEq by mouth 3 (three) times daily., Disp: , Rfl:  .   predniSONE (STERAPRED UNI-PAK 21 TAB) 5 MG (21) TBPK tablet, Take 5 mg by mouth daily., Disp: , Rfl:  .  tiZANidine (ZANAFLEX) 4 MG tablet, Take 0.5 tablets (2 mg total) by mouth 2 (two) times daily as needed for muscle spasms., Disp: 60 tablet, Rfl: 1 .  torsemide (DEMADEX) 10 MG tablet, Take 10 mg daily by mouth., Disp: , Rfl:  .  traMADol (ULTRAM) 50 MG tablet, Take 50 mg by mouth every 12 (twelve) hours as needed. , Disp: , Rfl:  .  traZODone (DESYREL) 50 MG tablet, Take by mouth at bedtime. 50-167m nightly as needed, Disp: , Rfl:  .  Turmeric 500 MG TABS, Take 500 mg by mouth. 2 tablets one time per day, Disp: , Rfl:  .  venlafaxine (EFFEXOR) 75 MG tablet, Take 75 mg by mouth 3 (three) times daily., Disp: , Rfl:  .  tapentadol (NUCYNTA) 50 MG tablet, Take 1 tablet (50 mg total) by mouth daily as needed. To Fill on or after: 03/01/18, 03/30/18, 04/29/18, Disp: 30 tablet, Rfl: 0  ROS  Constitutional: Denies any fever or chills Gastrointestinal: No reported hemesis, hematochezia, vomiting, or acute GI distress Musculoskeletal: Denies any acute onset joint swelling, redness, loss of ROM, or weakness Neurological: No reported episodes of acute onset apraxia, aphasia, dysarthria, agnosia, amnesia, paralysis, loss of coordination, or loss of consciousness  Allergies  [name] is allergic to codeine; hydromorphone; latex; oxycodone-acetaminophen; sulfa antibiotics; bupropion; gabapentin; hydrocodone; morphine and related; and procaine.  [place] [facility]: Ms. HGeralynn RileRogozinski  reports that she does not use drugs. Alcohol:  reports that she drinks about 2.0 standard drinks of alcohol per week. Tobacco:  reports that she has never smoked. She has never used smokeless tobacco. 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 Surgical: Ms. HGeralynn RileRogozinski  has a past surgical history that includes Ankle reconstruction (Left); Brachioplasty; Cataract extraction; Cholecystectomy; Soft Tissue Tumor Resection; Abdominal hysterectomy (07/12/2014); Joint replacement (Bilateral, 07/2014, 03/2015); nasal reconstrucion; Toe Surgery (Right); Shoulder open rotator cuff repair; Colonoscopy with propofol (N/A, 12/07/2016); and Reduction mammaplasty (Bilateral). Family: family history includes Breast cancer in her paternal aunt.  Constitutional Exam  General appearance: alert, cooperative and morbidly obese Vitals:   03/01/18 0900  BP: (!) 152/63  Pulse: 63  Resp: 18  Temp: 98.3 F (36.8 C)  SpO2: 100%  Weight: 285 lb (129.3 kg)  Height: 5' 6"  (1.676 m)   BMI Assessment: Estimated body mass index is 46 kg/m as calculated from the following:   Height as of this encounter: 5' 6"  (1.676 m).   Weight as of this encounter: 285 lb (129.3 kg).  BMI interpretation table: BMI level Category Range association with higher incidence of chronic pain  <18 kg/m2 Underweight   18.5-24.9 kg/m2 Ideal body weight  25-29.9 kg/m2 Overweight Increased incidence by 20%  30-34.9 kg/m2 Obese (Class I) Increased incidence by 68%  35-39.9 kg/m2 Severe obesity (Class II) Increased incidence by 136%  >40 kg/m2 Extreme obesity (Class III) Increased incidence by 254%   Patient's current BMI Ideal Body weight  Body mass index is 46 kg/m. Ideal body weight: 59.3 kg (130 lb 11.7 oz) Adjusted ideal body weight: 87.3 kg (192 lb 7 oz)   BMI Readings from Last 4 Encounters:  03/01/18 46.00 kg/m  01/19/18 46.81 kg/m  12/22/17 46.00 kg/m  12/01/17 46.65 kg/m   Wt Readings from Last 4 Encounters:  03/01/18 285 lb (129.3 kg)  01/19/18 290 lb (131.5 kg)   12/22/17 285 lb (129.3 kg)  12/01/17 289 lb (131.1 kg)  Psych/Mental status: Alert, oriented x 3 (person, place, & time)       Eyes: PERLA Respiratory: No evidence of acute respiratory distress  Cervical Spine Area Exam  Skin & Axial Inspection: No masses, redness, edema, swelling, or associated skin lesions Alignment: Symmetrical Functional ROM: Unrestricted ROM      Stability: No instability detected Muscle Tone/Strength: Functionally intact. No obvious neuro-muscular anomalies detected. Sensory (Neurological): Unimpaired Palpation: No palpable anomalies              Upper Extremity (UE) Exam    Side: Right upper extremity  Side: Left upper extremity  Skin & Extremity Inspection: Skin color, temperature, and hair growth are WNL. No peripheral edema or cyanosis. No masses, redness, swelling, asymmetry, or associated skin lesions. No contractures.  Skin & Extremity Inspection: Skin color, temperature, and hair growth are WNL. No peripheral edema or cyanosis. No masses, redness, swelling, asymmetry, or associated skin lesions. No contractures.  Functional ROM: Unrestricted ROM          Functional ROM: Unrestricted ROM          Muscle Tone/Strength: Functionally intact. No obvious neuro-muscular anomalies detected.  Muscle Tone/Strength: Functionally intact. No obvious neuro-muscular anomalies detected.  Sensory (Neurological): Unimpaired          Sensory (Neurological): Unimpaired          Palpation: No palpable anomalies              Palpation: No palpable anomalies              Provocative Test(s):  Phalen's test: deferred Tinel's test: deferred Apley's scratch test (touch opposite shoulder):  Action 1 (Across chest): deferred Action 2 (Overhead): deferred Action 3 (LB reach): deferred   Provocative Test(s):  Phalen's test: deferred Tinel's test: deferred Apley's scratch test (touch opposite shoulder):  Action 1 (Across chest): deferred Action 2 (Overhead): deferred Action 3 (LB  reach): deferred    Thoracic Spine Area Exam  Skin & Axial Inspection: No masses, redness, or swelling Alignment: Symmetrical Functional ROM: Unrestricted ROM Stability: No instability detected Muscle Tone/Strength: Functionally intact. No obvious neuro-muscular anomalies detected. Sensory (Neurological): Unimpaired Muscle strength & Tone: No palpable anomalies  Lumbar Spine Area Exam  Skin & Axial Inspection: No masses, redness, or swelling Alignment: Symmetrical Functional ROM: Decreased ROM affecting both sides Stability: No instability detected Muscle Tone/Strength: Functionally intact. No obvious neuro-muscular anomalies detected. Sensory (Neurological): Musculoskeletal pain pattern Palpation: No palpable anomalies       Provocative Tests: Hyperextension/rotation test: (+) bilaterally for facet joint pain. Lumbar quadrant test (Kemp's test): (+) bilaterally for facet joint pain. Lateral bending test: deferred today       Patrick's Maneuver: deferred today  FABER test: deferred today                   S-I anterior distraction/compression test: deferred today         S-I lateral compression test: deferred today         S-I Thigh-thrust test: deferred today         S-I Gaenslen's test: deferred today          Gait & Posture Assessment  Ambulation: Limited Gait: Antalgic Posture: Difficulty standing up straight, due to pain   Lower Extremity Exam    Side: Right lower extremity  Side: Left lower extremity  Stability: No instability observed          Stability: No instability observed          Skin & Extremity Inspection: Skin color, temperature, and hair growth are WNL. No peripheral edema or cyanosis. No masses, redness, swelling, asymmetry, or associated skin lesions. No contractures.  Skin & Extremity Inspection: Skin color, temperature, and hair growth are WNL. No peripheral edema or cyanosis. No masses, redness, swelling, asymmetry, or associated skin  lesions. No contractures.  Functional ROM: Unrestricted ROM                  Functional ROM: Unrestricted ROM                  Muscle Tone/Strength: Functionally intact. No obvious neuro-muscular anomalies detected.  Muscle Tone/Strength: Functionally intact. No obvious neuro-muscular anomalies detected.  Sensory (Neurological): Unimpaired  Sensory (Neurological): Unimpaired  Palpation: No palpable anomalies  Palpation: No palpable anomalies   Assessment & Plan  Primary Diagnosis & Pertinent Problem List: The primary encounter diagnosis was Lumbar spondylosis. Diagnoses of Lumbar facet arthropathy, Lumbar degenerative disc disease, and Spasm of lumbar paraspinous muscle were also pertinent to this visit.  Visit Diagnosis: 1. Lumbar spondylosis   2. Lumbar facet arthropathy   3. Lumbar degenerative disc disease   4. Spasm of lumbar paraspinous muscle    General Recommendations: The pain condition that the patient suffers from is best treated with a multidisciplinary approach that involves an increase in physical activity to prevent de-conditioning and worsening of the pain cycle, as well as psychological counseling (formal and/or informal) to address the co-morbid psychological affects of pain. Treatment will often involve judicious use of pain medications and interventional procedures to decrease the pain, allowing the patient to participate in the physical activity that will ultimately produce long-lasting pain reductions. The goal of the multidisciplinary approach is to return the patient to a higher level of overall function and to restore their ability to perform activities of daily living.  72 year old female with history of lumbar spondylosis and lumbar facet arthropathy status post 2 series of right and left lumbar radiofrequency ablation who presents with worsening axial low back pain that is been refractory to over-the-counter medications.  Patient states that she has been utilizing  Nucynta from an old prescription and she finds this effective.  Patient takes 50 mg daily.  I will have the patient complete a urine drug screen and have her sign an opiate contract with our clinic and refill Nucynta as below.  Note: PMP checked and appropriate.  Plan of Care  Pharmacotherapy (Medications Ordered): Meds ordered this encounter  Medications  . DISCONTD: tapentadol (NUCYNTA) 50 MG tablet    Sig: Take 1 tablet (50 mg total) by mouth daily as needed. To Fill on or after: 03/01/18, 03/30/18, 04/29/18    Dispense:  30 tablet    Refill:  0  . DISCONTD: tapentadol (NUCYNTA) 50 MG tablet    Sig: Take 1 tablet (50 mg total) by mouth daily as needed. To Fill on or after: 03/01/18, 03/30/18, 04/29/18    Dispense:  30 tablet    Refill:  0  . tapentadol (NUCYNTA) 50 MG tablet    Sig: Take 1 tablet (50 mg total) by mouth daily as needed. To Fill on or after: 03/01/18, 03/30/18, 04/29/18    Dispense:  30 tablet    Refill:  0   Lab-work, procedure(s), and/or referral(s): Orders Placed This Encounter  Procedures  . Compliance Drug Analysis, Ur    Time Note: Greater than 50% of the 25 minute(s) of face-to-face time spent with [name], was spent in counseling/coordination of care regarding: [name]'s primary cause of pain, the treatment plan, treatment alternatives, medication side effects, the opioid analgesic risks and possible complications, the results, interpretation and significance of  her recent diagnostic interventional treatment(s), the appropriate use of her medications, realistic expectations, the goals of pain management (increased in functionality), the need to bring and keep the BMI below 30, the medication agreement and the patient's responsibilities when it comes to controlled substances. Provider-requested follow-up: Return in about 3 months (around 05/31/2018) for Medication Management.  Future Appointments  Date Time Provider [place]  05/31/2018  8:30 AM Gillis Santa, MD [facility] None    Primary Care Physician: Kirk Ruths, MD Location: [facility] Note by: Gillis Santa, M.D Date: 03/01/2018; Time: 10:11 AM  Patient Instructions  1. Sign opioid contract 2. Follow up in 3 months   You have been given 3 prescriptions for Nycynta.

## 2018-03-01 NOTE — Progress Notes (Signed)
Safety precautions to be maintained throughout the outpatient stay will include: orient to surroundings, keep bed in low position, maintain call bell within reach at all times, provide assistance with transfer out of bed and ambulation.  

## 2018-03-01 NOTE — Patient Instructions (Addendum)
1. Sign opioid contract 2. Follow up in 3 months   You have been given 3 prescriptions for Nycynta.

## 2018-03-05 LAB — COMPLIANCE DRUG ANALYSIS, UR

## 2018-05-31 ENCOUNTER — Encounter: Payer: Self-pay | Admitting: Student in an Organized Health Care Education/Training Program

## 2018-05-31 ENCOUNTER — Ambulatory Visit
Payer: Medicare Other | Attending: Student in an Organized Health Care Education/Training Program | Admitting: Student in an Organized Health Care Education/Training Program

## 2018-05-31 ENCOUNTER — Other Ambulatory Visit: Payer: Self-pay

## 2018-05-31 VITALS — BP 146/78 | HR 64 | Temp 98.1°F | Resp 18 | Ht 66.0 in | Wt 257.0 lb

## 2018-05-31 DIAGNOSIS — Z888 Allergy status to other drugs, medicaments and biological substances status: Secondary | ICD-10-CM | POA: Insufficient documentation

## 2018-05-31 DIAGNOSIS — E785 Hyperlipidemia, unspecified: Secondary | ICD-10-CM | POA: Insufficient documentation

## 2018-05-31 DIAGNOSIS — Z760 Encounter for issue of repeat prescription: Secondary | ICD-10-CM | POA: Diagnosis not present

## 2018-05-31 DIAGNOSIS — N3281 Overactive bladder: Secondary | ICD-10-CM | POA: Insufficient documentation

## 2018-05-31 DIAGNOSIS — Z9049 Acquired absence of other specified parts of digestive tract: Secondary | ICD-10-CM | POA: Diagnosis not present

## 2018-05-31 DIAGNOSIS — F329 Major depressive disorder, single episode, unspecified: Secondary | ICD-10-CM | POA: Insufficient documentation

## 2018-05-31 DIAGNOSIS — Z79899 Other long term (current) drug therapy: Secondary | ICD-10-CM | POA: Insufficient documentation

## 2018-05-31 DIAGNOSIS — M5136 Other intervertebral disc degeneration, lumbar region: Secondary | ICD-10-CM

## 2018-05-31 DIAGNOSIS — E039 Hypothyroidism, unspecified: Secondary | ICD-10-CM | POA: Insufficient documentation

## 2018-05-31 DIAGNOSIS — Z885 Allergy status to narcotic agent status: Secondary | ICD-10-CM | POA: Diagnosis not present

## 2018-05-31 DIAGNOSIS — K219 Gastro-esophageal reflux disease without esophagitis: Secondary | ICD-10-CM | POA: Insufficient documentation

## 2018-05-31 DIAGNOSIS — I10 Essential (primary) hypertension: Secondary | ICD-10-CM | POA: Insufficient documentation

## 2018-05-31 DIAGNOSIS — G894 Chronic pain syndrome: Secondary | ICD-10-CM | POA: Insufficient documentation

## 2018-05-31 DIAGNOSIS — M1288 Other specific arthropathies, not elsewhere classified, other specified site: Secondary | ICD-10-CM | POA: Insufficient documentation

## 2018-05-31 DIAGNOSIS — Z882 Allergy status to sulfonamides status: Secondary | ICD-10-CM | POA: Insufficient documentation

## 2018-05-31 DIAGNOSIS — M47816 Spondylosis without myelopathy or radiculopathy, lumbar region: Secondary | ICD-10-CM | POA: Diagnosis not present

## 2018-05-31 DIAGNOSIS — M545 Low back pain: Secondary | ICD-10-CM | POA: Insufficient documentation

## 2018-05-31 DIAGNOSIS — Z79890 Hormone replacement therapy (postmenopausal): Secondary | ICD-10-CM | POA: Diagnosis not present

## 2018-05-31 DIAGNOSIS — G4730 Sleep apnea, unspecified: Secondary | ICD-10-CM | POA: Insufficient documentation

## 2018-05-31 DIAGNOSIS — Z7982 Long term (current) use of aspirin: Secondary | ICD-10-CM | POA: Diagnosis not present

## 2018-05-31 DIAGNOSIS — K589 Irritable bowel syndrome without diarrhea: Secondary | ICD-10-CM | POA: Diagnosis not present

## 2018-05-31 DIAGNOSIS — N83209 Unspecified ovarian cyst, unspecified side: Secondary | ICD-10-CM | POA: Diagnosis not present

## 2018-05-31 MED ORDER — TAPENTADOL HCL 50 MG PO TABS: 50.0000 mg | ORAL_TABLET | Freq: Every day | ORAL | 0 refills | Status: DC | PRN

## 2018-05-31 MED ORDER — TAPENTADOL HCL 50 MG PO TABS: 50.0000 mg | ORAL_TABLET | Freq: Every day | ORAL | 0 refills | Status: AC | PRN

## 2018-05-31 NOTE — Progress Notes (Signed)
Patient's Name: [name]  MRN: [id number]  Referring Provider: Kirk Ruths, MD  DOB: [birth year]  PCP: Kirk Ruths, MD  DOS: 05/31/2018  Note by: Gillis Santa, MD  Service setting: Ambulatory outpatient  Specialty: Interventional Pain Management  Location: [facility] (AMB) Pain Management Facility    Patient type: Established   Primary Reason(s) for Visit: Encounter for prescription drug management. (Level of risk: moderate)  CC: Medication Refill (Nucynta ) and Back Pain  HPI  [name] is a 72 y.o. year old, female patient, who comes today for a medication management evaluation. She has Lumbar spondylosis; Lumbar facet arthropathy; and Lumbar degenerative disc disease on their problem list. Her primarily concern today is the Medication Refill (Nucynta ) and Back Pain  Pain Assessment: Location: Right, Left, Lower Back Radiating: Denies  Onset: More than a month ago Duration: Chronic pain Quality: Constant, Aching Severity: 1 /10 (subjective, self-reported pain score)  Note: Reported level is compatible with observation.                         When using our objective Pain Scale, levels between 6 and 10/10 are said to belong in an emergency room, as it progressively worsens from a 6/10, described as severely limiting, requiring emergency care not usually available at an outpatient pain management facility. At a 6/10 level, communication becomes difficult and requires great effort. Assistance to reach the emergency department may be required. Facial flushing and profuse sweating along with potentially dangerous increases in heart rate and blood pressure will be evident. Effect on ADL: "I don't bend over much anymore"  Timing: Constant Modifying factors: Nucynta and tylenol  BP: (!) 146/78  HR: 64  [name] was last scheduled for an appointment on 03/01/2018 for medication management. During today's appointment we reviewed Ms.  Hollifield [name]'s chronic pain status, as well as her outpatient medication regimen.  Patient overall is doing well.  She is here for medication refill.  Patient takes Nucynta 50 mg daily as needed.  She finds his medication effective.  Since August, the patient has lost approximately 30 pounds by weight watchers.  She states that this is help with her overall mobility and chronic pain status.  The patient  reports no history of drug use. Her body mass index is 41.48 kg/m.  Further details on both, my assessment(s), as well as the proposed treatment plan, please see below.  Controlled Substance Pharmacotherapy Assessment REMS (Risk Evaluation and Mitigation Strategy)  Analgesic: Nucynta 50 mg daily, quantity 30/month MME/day: Approximately 20 mg/day.  Janne Napoleon, RN  05/31/2018  9:07 AM  Sign when Signing Visit Nursing Pain Medication Assessment:  Safety precautions to be maintained throughout the outpatient stay will include: orient to surroundings, keep bed in low position, maintain call bell within reach at all times, provide assistance with transfer out of bed and ambulation.  Medication Inspection Compliance: Pill count conducted under aseptic conditions, in front of the patient. Neither the pills nor the bottle was removed from the patient's sight at any time. Once count was completed pills were immediately returned to the patient in their original bottle.  Medication: Tapentadol (Nucynta) Pill/Patch Count: 16 of 30 pills remain Pill/Patch Appearance: Markings consistent with prescribed medication Bottle Appearance: Standard pharmacy container. Clearly labeled. Filled Date: 35 / 29 / 2019 Last Medication intake:  Today   Pharmacokinetics: Liberation and absorption (onset of action): WNL Distribution (time to peak effect): WNL  Metabolism and excretion (duration of action): WNL         Pharmacodynamics: Desired effects: Analgesia: [name] reports >50%  benefit. Functional ability: Patient reports that medication allows her to accomplish basic ADLs Clinically meaningful improvement in function (CMIF): Sustained CMIF goals met Perceived effectiveness: Described as relatively effective, allowing for increase in activities of daily living (ADL) Undesirable effects: Side-effects or Adverse reactions: None reported Monitoring: [place] PMP: Online review of the past 2-monthperiod conducted. Compliant with practice rules and regulations Last UDS on record: Summary  Date Value Ref Range Status  03/01/2018 FINAL  Final    Comment:    ==================================================================== [facility] COMP DRUG ANALYSIS,UR ==================================================================== Test                             Result       Flag       Units Drug Present and Declared for Prescription Verification   Tapentadol                     4028         EXPECTED   ng/mg creat    Source of tapentadol is a scheduled prescription medication.   Baclofen                       PRESENT      EXPECTED   Tizanidine                     PRESENT      EXPECTED   Trazodone                      PRESENT      EXPECTED   1,3 chlorophenyl piperazine    PRESENT      EXPECTED    1,3-chlorophenyl piperazine is an expected metabolite of    trazodone.   Venlafaxine                    PRESENT      EXPECTED   Desmethylvenlafaxine           PRESENT      EXPECTED    Desmethylvenlafaxine is an expected metabolite of venlafaxine.   Acetaminophen                  PRESENT      EXPECTED Drug Absent but Declared for Prescription Verification   Tramadol                       Not Detected UNEXPECTED ng/mg creat   Duloxetine                     Not Detected UNEXPECTED   Salicylate                     Not Detected UNEXPECTED    Aspirin, as indicated in the declared medication list, is not    always detected even when used as  directed. ==================================================================== Test                      Result    Flag   Units      Ref Range   Creatinine              137  mg/dL      >=20 ==================================================================== Declared Medications:  The flagging and interpretation on this report are based on the  following declared medications.  Unexpected results may arise from  inaccuracies in the declared medications.  **Note: The testing scope of this panel includes these medications:  Baclofen  Duloxetine (Cymbalta)  Tapentadol (Nucynta)  Tramadol  Trazodone  Venlafaxine  **Note: The testing scope of this panel does not include small to  moderate amounts of these reported medications:  Acetaminophen (Tylenol)  Aspirin (Aspirin 81)  Tizanidine  **Note: The testing scope of this panel does not include following  reported medications:  Amantadine  Amlodipine Besylate  Azelastine (Astelin)  Benzonatate  Carvedilol (Coreg)  Donepezil (Aricept)  Doxycycline  Folic acid  Levothyroxine  Losartan (Cozaar)  Melatonin  Montelukast (Singulair)  Multivitamin  Omeprazole (Nexium)  Potassium (K-Dur)  Prednisone  Torsemide (Demadex)  Turmeric  Vitamin B (Super B Complex)  Vitamin C  Vitamin D3 ==================================================================== For clinical consultation, please call [phone]. ====================================================================    UDS interpretation: Compliant          Medication Assessment Form: Reviewed. Patient indicates being compliant with therapy Treatment compliance: Compliant Risk Assessment Profile: Aberrant behavior: See prior evaluations. None observed or detected today Comorbid factors increasing risk of overdose: See prior notes. No additional risks detected today Opioid risk tool (ORT) (Total Score): 2 Personal History of Substance Abuse (SUD-Substance use  disorder):  Alcohol: Negative  Illegal Drugs: Negative  Rx Drugs: Negative  ORT Risk Level calculation: Low Risk Risk of substance use disorder (SUD): Low Opioid Risk Tool - 05/31/18 0905      Family History of Substance Abuse   Alcohol  Positive Female    Illegal Drugs  Negative    Rx Drugs  Negative      Personal History of Substance Abuse   Alcohol  Negative    Illegal Drugs  Negative    Rx Drugs  Negative      Age   Age between 75-45 years   No      History of Preadolescent Sexual Abuse   History of Preadolescent Sexual Abuse  Negative or Female      Psychological Disease   Psychological Disease  Negative    Depression  Positive      Total Score   Opioid Risk Tool Scoring  2    Opioid Risk Interpretation  Low Risk      ORT Scoring interpretation table:  Score <3 = Low Risk for SUD  Score between 4-7 = Moderate Risk for SUD  Score >8 = High Risk for Opioid Abuse   Risk Mitigation Strategies:  Patient Counseling: Covered Patient-Prescriber Agreement (PPA): Present and active  Notification to other healthcare providers: Done  Pharmacologic Plan: No change in therapy, at this time.              Meds   Current Outpatient Medications:  .  acetaminophen (TYLENOL) 500 MG tablet, Take 500 mg by mouth every 6 (six) hours as needed., Disp: , Rfl:  .  amantadine (SYMMETREL) 100 MG capsule, Take 100 mg by mouth 2 (two) times daily., Disp: , Rfl:  .  amLODipine (NORVASC) 5 MG tablet, Take 5 mg by mouth daily., Disp: , Rfl:  .  Ascorbic Acid (VITAMIN C) 1000 MG tablet, Take 1,000 mg daily by mouth., Disp: , Rfl:  .  aspirin EC 81 MG tablet, Take 81 mg by mouth daily., Disp: , Rfl:  .  azelastine (ASTELIN)  0.1 % nasal spray, 2 sprays as needed for rhinitis. Use in each nostril as directed, Disp: , Rfl:  .  B Complex Vitamins (VITAMIN-B COMPLEX PO), Take 100 mg daily by mouth., Disp: , Rfl:  .  baclofen (LIORESAL) 20 MG tablet, Take 20 mg by mouth daily as needed for muscle  spasms., Disp: , Rfl:  .  benzonatate (TESSALON) 100 MG capsule, Take 1 capsule (100 mg total) by mouth 3 (three) times daily as needed., Disp: 21 capsule, Rfl: 0 .  carvedilol (COREG) 25 MG tablet, Take 25 mg 4 (four) times daily by mouth. , Disp: , Rfl:  .  celecoxib (CELEBREX) 200 MG capsule, Take 200 mg by mouth 2 (two) times daily., Disp: , Rfl:  .  Cholecalciferol (VITAMIN D3) 2000 units TABS, Take 2,000 Units daily by mouth., Disp: , Rfl:  .  donepezil (ARICEPT) 10 MG tablet, Take 10 mg by mouth at bedtime., Disp: , Rfl:  .  doxycycline (VIBRA-TABS) 100 MG tablet, Take 1 tablet (100 mg total) by mouth 2 (two) times daily., Disp: 14 tablet, Rfl: 0 .  esomeprazole (NEXIUM) 40 MG capsule, Take 40 mg by mouth daily., Disp: , Rfl:  .  folic acid (FOLVITE) 109 MCG tablet, Take 400 mcg by mouth 2 (two) times daily., Disp: , Rfl:  .  levothyroxine (SYNTHROID, LEVOTHROID) 112 MCG tablet, Take 112 mcg by mouth daily., Disp: , Rfl:  .  losartan (COZAAR) 100 MG tablet, Take 100 mg by mouth daily., Disp: , Rfl:  .  Melatonin 3 MG TABS, Take 1 tablet at bedtime by mouth. , Disp: , Rfl:  .  montelukast (SINGULAIR) 10 MG tablet, Take 10 mg by mouth every evening., Disp: , Rfl:  .  Multiple Vitamin (MULTIVITAMIN) tablet, Take 1 tablet by mouth daily., Disp: , Rfl:  .  OnabotulinumtoxinA (BOTOX IJ), Inject every 4 (four) months as directed., Disp: , Rfl:  .  potassium chloride SA (K-DUR,KLOR-CON) 20 MEQ tablet, Take 20 mEq by mouth 3 (three) times daily., Disp: , Rfl:  .  predniSONE (STERAPRED UNI-PAK 21 TAB) 5 MG (21) TBPK tablet, Take 5 mg by mouth daily., Disp: , Rfl:  .  tiZANidine (ZANAFLEX) 4 MG tablet, Take 0.5 tablets (2 mg total) by mouth 2 (two) times daily as needed for muscle spasms., Disp: 60 tablet, Rfl: 1 .  torsemide (DEMADEX) 10 MG tablet, Take 10 mg daily by mouth., Disp: , Rfl:  .  traMADol (ULTRAM) 50 MG tablet, Take 50 mg by mouth every 12 (twelve) hours as needed. , Disp: , Rfl:  .   traZODone (DESYREL) 50 MG tablet, Take by mouth at bedtime. 50-131m nightly as needed, Disp: , Rfl:  .  Turmeric 500 MG TABS, Take 500 mg by mouth. 2 tablets one time per day, Disp: , Rfl:  .  venlafaxine (EFFEXOR) 75 MG tablet, Take 75 mg by mouth 3 (three) times daily., Disp: , Rfl:  .  [START ON 06/12/2018] tapentadol (NUCYNTA) 50 MG tablet, Take 1 tablet (50 mg total) by mouth daily as needed., Disp: 30 tablet, Rfl: 0 .  [START ON 07/12/2018] tapentadol (NUCYNTA) 50 MG tablet, Take 1 tablet (50 mg total) by mouth daily as needed for moderate pain or severe pain. Do not take if you have epilepsy or a history of seizures. Swallow tablets whole. Do not chew, crush or dissolve., Disp: 30 tablet, Rfl: 0  ROS  Constitutional: Denies any fever or chills Gastrointestinal: No reported hemesis, hematochezia, vomiting, or acute  GI distress Musculoskeletal: Denies any acute onset joint swelling, redness, loss of ROM, or weakness Neurological: No reported episodes of acute onset apraxia, aphasia, dysarthria, agnosia, amnesia, paralysis, loss of coordination, or loss of consciousness  Allergies  [name] is allergic to codeine; hydromorphone; latex; oxycodone-acetaminophen; sulfa antibiotics; bupropion; gabapentin; hydrocodone; morphine and related; and procaine.  [place]  Drug: [name]  reports no history of drug use. Alcohol:  reports current alcohol use of about 2.0 standard drinks of alcohol per week. Tobacco:  reports that she has never smoked. She has never used smokeless tobacco. 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 Surgical: [name]  has a past surgical history that includes Ankle reconstruction (Left); Brachioplasty; Cataract extraction; Cholecystectomy; Soft Tissue Tumor Resection; Abdominal hysterectomy (07/12/2014); Joint replacement (Bilateral, 07/2014, 03/2015); nasal reconstrucion; Toe Surgery (Right); Shoulder open rotator cuff repair; Colonoscopy with propofol (N/A, 12/07/2016); and Reduction mammaplasty (Bilateral). Family: family history includes Breast cancer in her paternal aunt.  Constitutional Exam  General appearance: Well nourished, well developed, and well hydrated. In no apparent acute distress Vitals:   05/31/18 0858  BP: (!) 146/78  Pulse: 64  Resp: 18  Temp: 98.1 F (36.7 C)  SpO2: 99%  Weight: 257 lb (116.6 kg)  Height: _0  (1.676 m)   BMI Assessment: Estimated body mass index is 41.48 kg/m as calculated from the following:   Height as of this encounter: _1  (1.676 m).   Weight as of this encounter: 257 lb (116.6 kg).  BMI interpretation table: BMI level Category Range association with higher incidence of chronic pain  <18 kg/m2 Underweight   18.5-24.9 kg/m2 Ideal body weight   25-29.9 kg/m2 Overweight Increased incidence by 20%  30-34.9 kg/m2 Obese (Class I) Increased incidence by 68%  35-39.9 kg/m2 Severe obesity (Class II) Increased incidence by 136%  >40 kg/m2 Extreme obesity (Class III) Increased incidence by 254%   Patient's current BMI Ideal Body weight  Body mass index is 41.48 kg/m. Ideal body weight: 59.3 kg (130 lb 11.7 oz) Adjusted ideal body weight: 82.2 kg (181 lb 3.8 oz)   BMI Readings from Last 4 Encounters:  05/31/18 41.48 kg/m  03/01/18 46.00 kg/m  01/19/18 46.81 kg/m  12/22/17 46.00 kg/m   Wt Readings from Last 4 Encounters:  05/31/18 257 lb (116.6 kg)  03/01/18 285 lb (129.3 kg)  01/19/18 290 lb (131.5 kg)  12/22/17 285 lb (129.3 kg)  Psych/Mental status: Alert, oriented x 3 (person, place, & time)        Eyes: PERLA Respiratory: No evidence of acute respiratory distress  Cervical Spine Area Exam  Skin & Axial Inspection: No masses, redness, edema, swelling, or associated skin lesions Alignment: Symmetrical Functional ROM: Unrestricted ROM      Stability: No instability detected Muscle Tone/Strength: Functionally intact. No obvious neuro-muscular anomalies detected. Sensory (Neurological): Unimpaired Palpation: No palpable anomalies              Upper Extremity (UE) Exam    Side: Right upper extremity  Side: Left upper extremity  Skin & Extremity Inspection: Skin color, temperature, and hair growth are WNL. No peripheral edema or cyanosis. No masses, redness, swelling, asymmetry, or associated skin lesions. No contractures.  Skin & Extremity Inspection: Skin color,  temperature, and hair growth are WNL. No peripheral edema or cyanosis. No masses, redness, swelling, asymmetry, or associated skin lesions. No contractures.  Functional ROM: Unrestricted ROM          Functional ROM: Unrestricted ROM          Muscle Tone/Strength: Functionally intact. No obvious neuro-muscular anomalies detected.  Muscle Tone/Strength: Functionally intact. No obvious neuro-muscular anomalies detected.  Sensory (Neurological): Unimpaired          Sensory (Neurological): Unimpaired          Palpation: No palpable anomalies              Palpation: No palpable anomalies              Provocative Test(s):  Phalen's test: deferred Tinel's test: deferred Apley's scratch test (touch opposite shoulder):  Action 1 (Across chest): deferred Action 2 (Overhead): deferred Action 3 (LB reach): deferred   Provocative Test(s):  Phalen's test: deferred Tinel's test: deferred Apley's scratch test (touch opposite shoulder):  Action 1 (Across chest): deferred Action 2 (Overhead): deferred Action 3 (LB reach): deferred    Thoracic Spine Area Exam  Skin & Axial Inspection: No masses, redness, or swelling Alignment:  Symmetrical Functional ROM: Unrestricted ROM Stability: No instability detected Muscle Tone/Strength: Functionally intact. No obvious neuro-muscular anomalies detected. Sensory (Neurological): Unimpaired Muscle strength & Tone: No palpable anomalies  Lumbar Spine Area Exam  Skin & Axial Inspection: No masses, redness, or swelling Alignment: Symmetrical Functional ROM: Decreased ROM       Stability: No instability detected Muscle Tone/Strength: Functionally intact. No obvious neuro-muscular anomalies detected. Sensory (Neurological): Musculoskeletal pain pattern Palpation: No palpable anomalies       Provocative Tests: Hyperextension/rotation test: (+) bilaterally for facet joint pain. Lumbar quadrant test (Kemp's test): deferred today       Lateral bending test: deferred today       Patrick's Maneuver: deferred today                   FABER* test: deferred today                   S-I anterior distraction/compression test: deferred today         S-I lateral compression test: deferred today         S-I Thigh-thrust test: deferred today         S-I Gaenslen's test: deferred today         *(Flexion, ABduction and External Rotation)  Gait & Posture Assessment  Ambulation: Unassisted Gait: Relatively normal for age and body habitus Posture: WNL   Lower Extremity Exam    Side: Right lower extremity  Side: Left lower extremity  Stability: No instability observed          Stability: No instability observed          Skin & Extremity Inspection: Skin color, temperature, and hair growth are WNL. No peripheral edema or cyanosis. No masses, redness, swelling, asymmetry, or associated skin lesions. No contractures.  Skin & Extremity Inspection: Skin color, temperature, and hair growth are WNL. No peripheral edema or cyanosis. No masses, redness, swelling, asymmetry, or associated skin lesions. No contractures.  Functional ROM: Unrestricted ROM                  Functional ROM: Unrestricted ROM                   Muscle Tone/Strength: Functionally intact. No obvious neuro-muscular anomalies detected.  Muscle Tone/Strength: Functionally intact. No obvious neuro-muscular anomalies detected.  Sensory (Neurological): Unimpaired        Sensory (Neurological): Unimpaired        DTR: Patellar: deferred today Achilles: deferred today Plantar: deferred today  DTR: Patellar: deferred today Achilles: deferred today Plantar: deferred today  Palpation: No palpable anomalies  Palpation: No palpable anomalies   Assessment  Primary Diagnosis & Pertinent Problem List: The primary encounter diagnosis was Lumbar spondylosis. Diagnoses of Lumbar facet arthropathy, Lumbar degenerative disc disease, and Chronic pain syndrome were also pertinent to this visit.  Status Diagnosis  Controlled Controlled Controlled 1. Lumbar spondylosis   2. Lumbar facet arthropathy   3. Lumbar degenerative disc disease   4. Chronic pain syndrome     General Recommendations: The pain condition that the patient suffers from is best treated with a multidisciplinary approach that involves an increase in physical activity to prevent de-conditioning and worsening of the pain cycle, as well as psychological counseling (formal and/or informal) to address the co-morbid psychological affects of pain. Treatment will often involve judicious use of pain medications and interventional procedures to decrease the pain, allowing the patient to participate in the physical activity that will ultimately produce long-lasting pain reductions. The goal of the multidisciplinary approach is to return the patient to a higher level of overall function and to restore their ability to perform activities of daily living.   72 year old female with a history of chronic pain syndrome secondary lumbar generative disc disease, lumbar facet arthropathy, lumbar spondylosis status post  left and right lumbar RFA performed on 12/01/2017 and 12/22/2017.  Commended  patient on weight loss.  She is continuing with weight watchers.  Can consider performing lumbar RFA in the future when return of pain.  Overall patient's pain well managed with Nucynta 50 mg daily PRN.  Patient trying to exercise more as well.  Plan: -Refill Nucynta as below, prescription to last until end of February -Continue all other non-opioid analgesics -Continue with weight loss strategies and healthy eating -Repeat UDS today  Plan of Care  Pharmacotherapy (Medications Ordered): Meds ordered this encounter  Medications  . tapentadol (NUCYNTA) 50 MG tablet    Sig: Take 1 tablet (50 mg total) by mouth daily as needed.    Dispense:  30 tablet    Refill:  0  . tapentadol (NUCYNTA) 50 MG tablet    Sig: Take 1 tablet (50 mg total) by mouth daily as needed for moderate pain or severe pain. Do not take if you have epilepsy or a history of seizures. Swallow tablets whole. Do not chew, crush or dissolve.    Dispense:  30 tablet    Refill:  0    Do not place this medication, or any other prescription from our practice, on "Automatic Refill". Patient may have prescription filled one day early if pharmacy is closed on scheduled refill date.   Lab-work, procedure(s), and/or referral(s): Orders Placed This Encounter  Procedures  . ToxASSURE Select 13 (MW), Urine    Considering:   Repeat L3, L4, L5 radiofrequency ablation ( left and right lumbar RFA performed on 12/01/2017 and 12/22/2017.)   PRN Procedures:   To be determined at a later time   Provider-requested follow-up: Return in about 10 weeks (around 08/09/2018) for Medication Management.  Future Appointments  Date Time Provider [place]  08/09/2018  8:45 AM Gillis Santa, MD [facility] None    Primary Care Physician: Kirk Ruths, MD Location: [facility] Outpatient Pain Management Facility Note  by: Gillis Santa, M.D Date: 05/31/2018; Time: 9:59 AM  There are no Patient Instructions on file for this visit.

## 2018-05-31 NOTE — Progress Notes (Signed)
Nursing Pain Medication Assessment:  Safety precautions to be maintained throughout the outpatient stay will include: orient to surroundings, keep bed in low position, maintain call bell within reach at all times, provide assistance with transfer out of bed and ambulation.  Medication Inspection Compliance: Pill count conducted under aseptic conditions, in front of the patient. Neither the pills nor the bottle was removed from the patient's sight at any time. Once count was completed pills were immediately returned to the patient in their original bottle.  Medication: Tapentadol (Nucynta) Pill/Patch Count: 16 of 30 pills remain Pill/Patch Appearance: Markings consistent with prescribed medication Bottle Appearance: Standard pharmacy container. Clearly labeled. Filled Date: 6411 / 29 / 2019 Last Medication intake:  Today

## 2018-06-04 LAB — TOXASSURE SELECT 13 (MW), URINE

## 2018-06-22 IMAGING — MG MM DIGITAL SCREENING BILAT W/ TOMO W/ CAD
8 of 16 series · 8 of 32 positions shown · non-contrast
Comparison: Previous exam(s).

ACR Breast Density Category a: The breast tissue is almost entirely
fatty.

CLINICAL DATA: Screening.

EXAM:
DIGITAL SCREENING BILATERAL MAMMOGRAM WITH TOMO AND CAD

[R MLO]
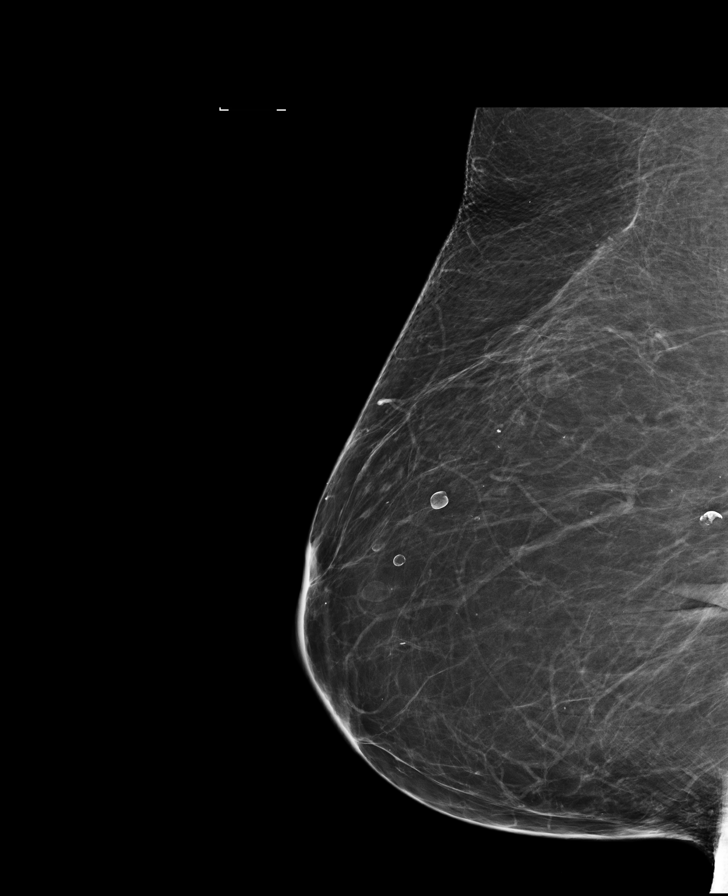

[L CC (1 of 2)]
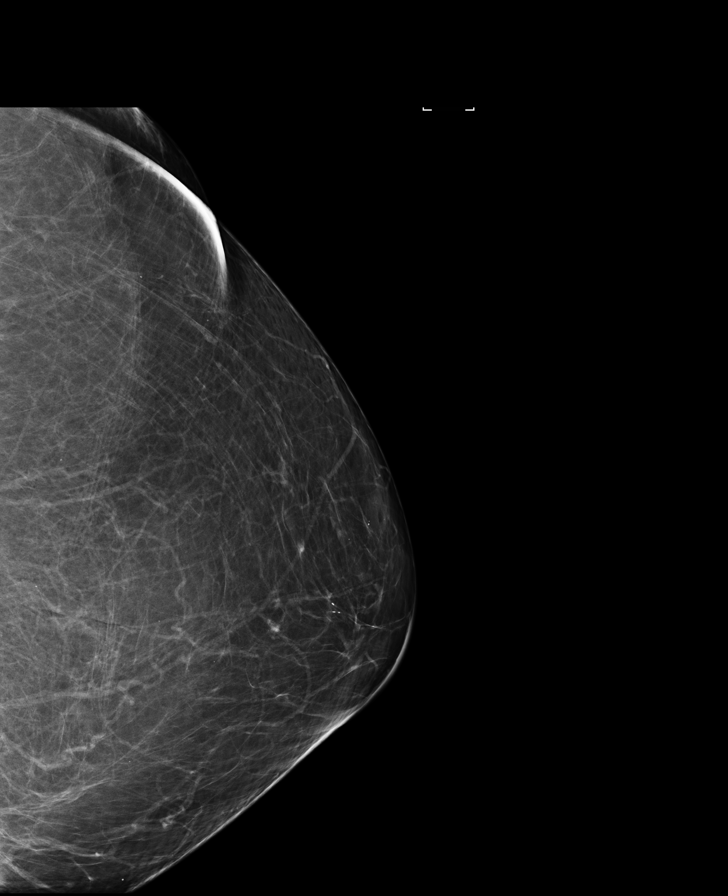

[R XCCL]
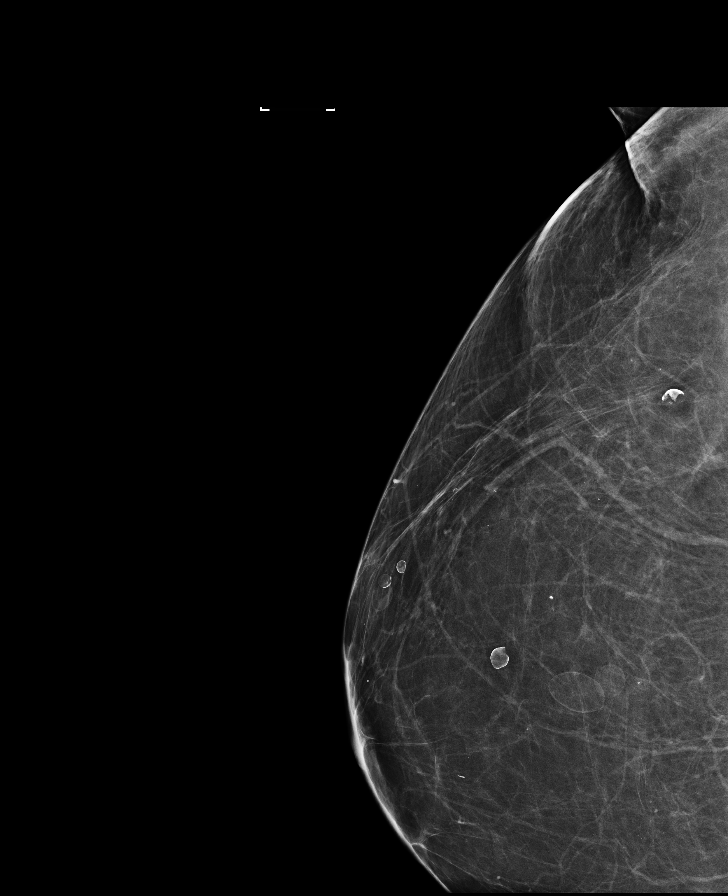

[L MLO (1 of 2)]
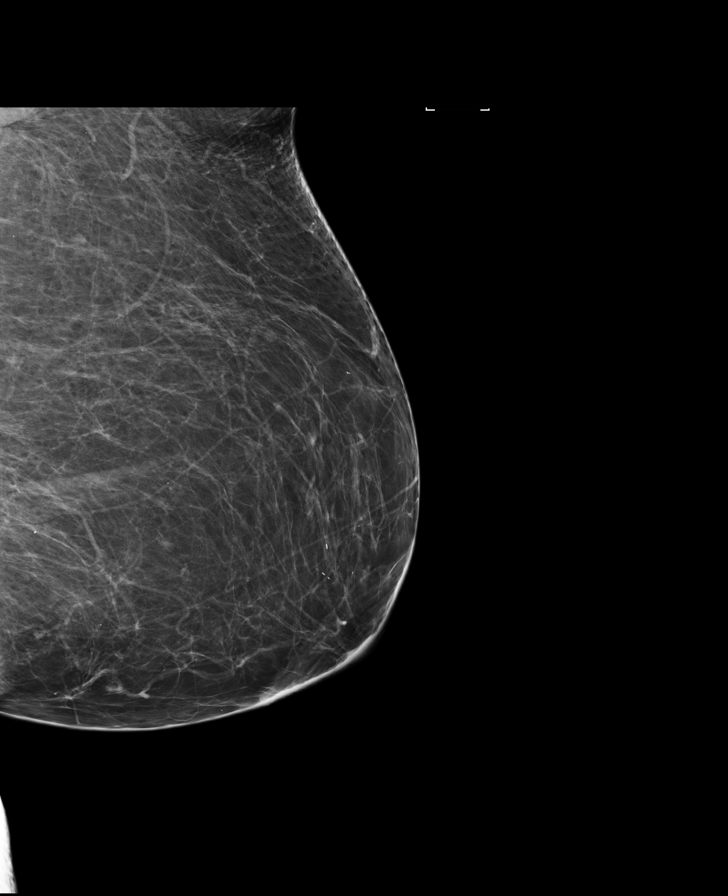

[L CC synth-2D]
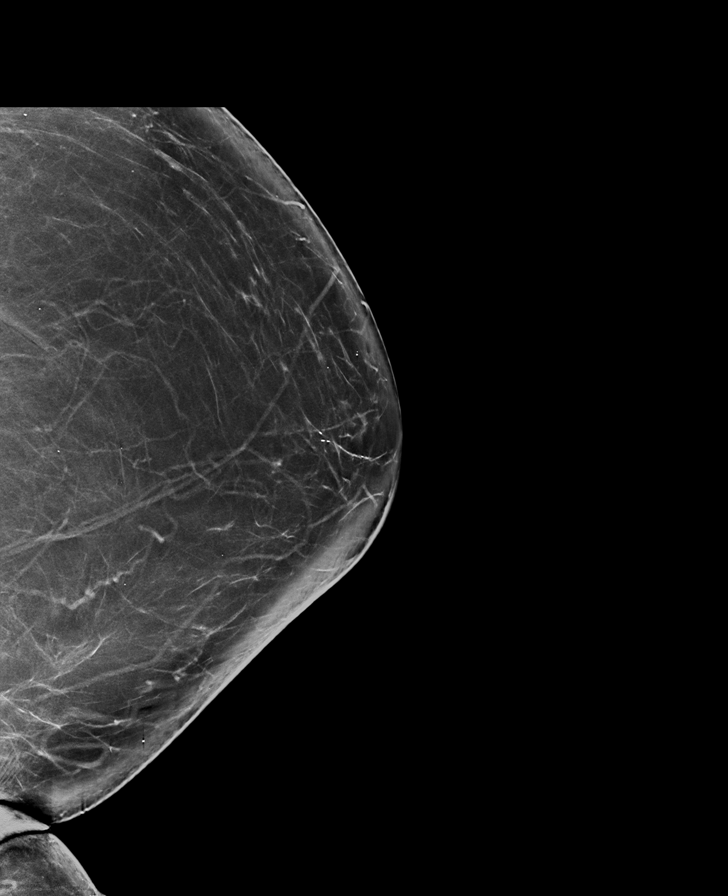

[R CC]
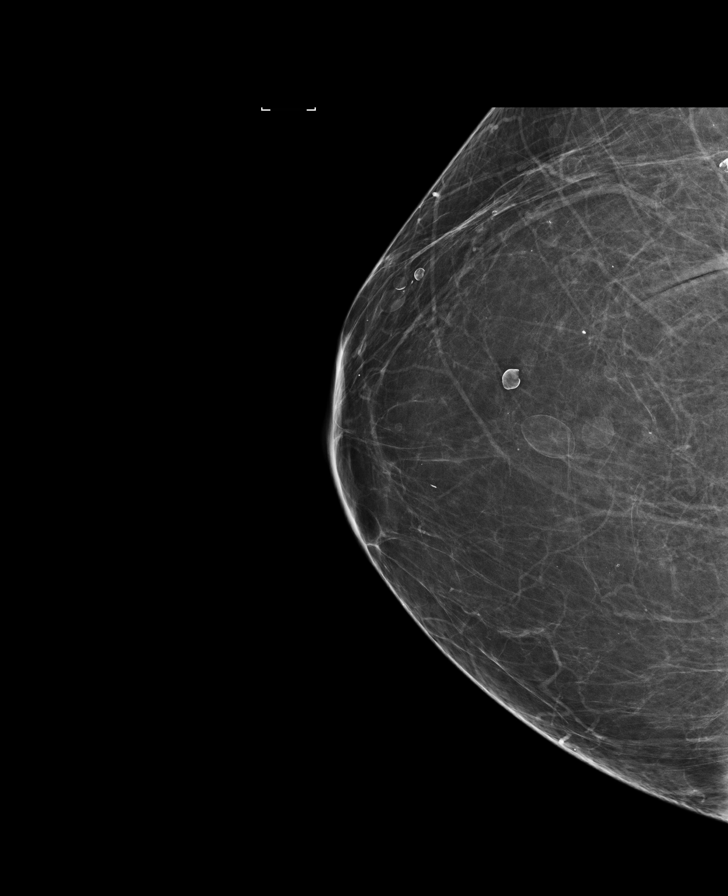

[L CC (2 of 2)]
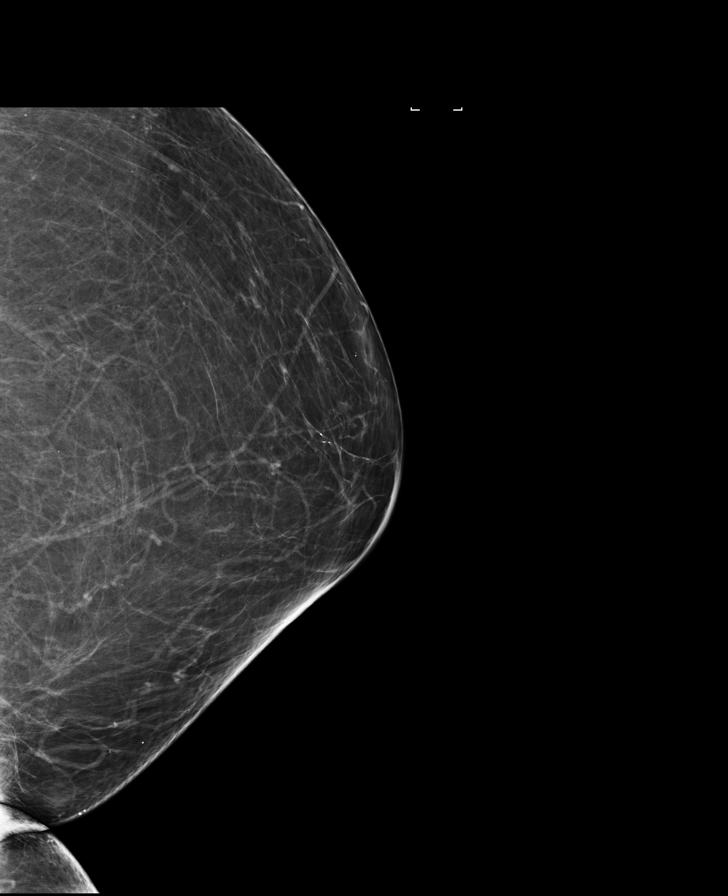

[L MLO (2 of 2)]
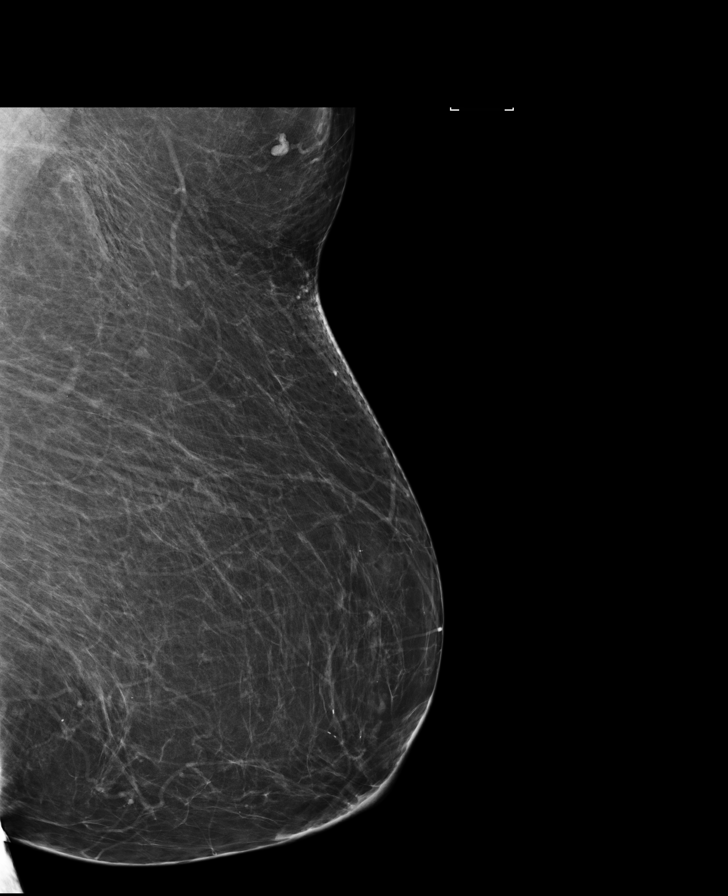

[8 of 32 positions shown; findings below may reference images not displayed]

FINDINGS: There are no findings suspicious for malignancy. Images were
processed with CAD.
IMPRESSION: No mammographic evidence of malignancy. A result letter of this
screening mammogram will be mailed directly to the patient.

RECOMMENDATION:
Screening mammogram in one year. (Code:[id number])

BI-RADS CATEGORY  1: Negative.

## 2018-08-09 ENCOUNTER — Encounter: Payer: Self-pay | Admitting: Student in an Organized Health Care Education/Training Program

## 2018-08-09 ENCOUNTER — Other Ambulatory Visit: Payer: Self-pay

## 2018-08-09 ENCOUNTER — Ambulatory Visit
Payer: Medicare Other | Attending: Student in an Organized Health Care Education/Training Program | Admitting: Student in an Organized Health Care Education/Training Program

## 2018-08-09 VITALS — BP 138/60 | HR 68 | Temp 98.0°F | Resp 18 | Ht 66.0 in | Wt 253.6 lb

## 2018-08-09 DIAGNOSIS — M62830 Muscle spasm of back: Secondary | ICD-10-CM | POA: Insufficient documentation

## 2018-08-09 DIAGNOSIS — M5136 Other intervertebral disc degeneration, lumbar region: Secondary | ICD-10-CM

## 2018-08-09 DIAGNOSIS — G894 Chronic pain syndrome: Secondary | ICD-10-CM | POA: Insufficient documentation

## 2018-08-09 DIAGNOSIS — M47816 Spondylosis without myelopathy or radiculopathy, lumbar region: Secondary | ICD-10-CM | POA: Diagnosis present

## 2018-08-09 MED ORDER — TAPENTADOL HCL 50 MG PO TABS: 50.0000 mg | ORAL_TABLET | Freq: Every day | ORAL | 0 refills | Status: AC | PRN

## 2018-08-09 MED ORDER — TAPENTADOL HCL 50 MG PO TABS: 50.0000 mg | ORAL_TABLET | Freq: Every day | ORAL | 0 refills | Status: DC | PRN

## 2018-08-09 NOTE — Progress Notes (Signed)
Patient's Name: [name]  MRN: [id number]  Referring Provider: Kirk Ruths, MD  DOB: 1945/07/30  PCP: Kirk Ruths, MD  DOS: 08/09/2018  Note by: Gillis Santa, MD  Service setting: Ambulatory outpatient  Specialty: Interventional Pain Management  Location: [facility] (AMB) Pain Management Facility    Patient type: Established   Primary Reason(s) for Visit: Encounter for prescription drug management. (Level of risk: moderate)  CC: Back Pain (low)  HPI  [name] is a 73 y.o. year old, female patient, who comes today for a medication management evaluation. She has Lumbar spondylosis; Lumbar facet arthropathy; and Lumbar degenerative disc disease on their problem list. Her primarily concern today is the Back Pain (low)  Pain Assessment: Location: Lower, Left, Right Back Radiating: denies Onset: More than a month ago Duration: Chronic pain Quality:   Severity: 0-No pain/10 (subjective, self-reported pain score)  Note: Reported level is compatible with observation.                         When using our objective Pain Scale, levels between 6 and 10/10 are said to belong in an emergency room, as it progressively worsens from a 6/10, described as severely limiting, requiring emergency care not usually available at an outpatient pain management facility. At a 6/10 level, communication becomes difficult and requires great effort. Assistance to reach the emergency department may be required. Facial flushing and profuse sweating along with potentially dangerous increases in heart rate and blood pressure will be evident. Effect on ADL:   Timing: Intermittent(No pain most days, "mostly related to atmospheric pressure) Modifying factors: meds BP: 138/60  HR: 68  [name] was last scheduled for an appointment on 05/31/2018 for medication management. During today's appointment we reviewed [name]'s chronic pain status, as well  as her outpatient medication regimen.  Patient follows up today for medication management.  She is lost approximately 47 pounds since August 2019.  She has been following weight watchers.  Her BMI has decreased from 46-40.  She states that she is feeling better and her pain levels have reduced.  She looks forward to walking more in the spring and summer months.  She states that overall her back is doing better.  The patient  reports no history of drug use. Her body mass index is 40.93 kg/m.  Further details on both, my assessment(s), as well as the proposed treatment plan, please see below.  Controlled Substance Pharmacotherapy Assessment REMS (Risk Evaluation and Mitigation Strategy)   07/13/2018  2   05/31/2018  Nucynta 50 MG Tablet  30.00 30 Bi Lat   [id number]   [facility] (7587)   0  20.00 MME     No notes on file Pharmacokinetics: Liberation and absorption (onset of action): WNL Distribution (time to peak effect): WNL Metabolism and excretion (duration of action): WNL         Pharmacodynamics: Desired effects: Analgesia: [name] reports >50% benefit. Functional ability: Patient reports that medication allows her to accomplish basic ADLs Clinically meaningful improvement in function (CMIF): Sustained CMIF goals met Perceived effectiveness: Described as relatively effective, allowing for increase in activities of daily living (ADL) Undesirable effects: Side-effects or Adverse reactions: None reported Monitoring: [place] PMP: Online review of the past 85-monthperiod conducted. Compliant with practice rules and regulations Last UDS on record: Summary  Date Value Ref Range Status  05/31/2018 FINAL  Final    Comment:    ====================================================================  TOXASSURE SELECT 13 (MW) ==================================================================== Test                             Result       Flag       Units Drug Present and Declared for  Prescription Verification   Tapentadol                     3923         EXPECTED   ng/mg creat    Source of tapentadol is a scheduled prescription medication. Drug Absent but Declared for Prescription Verification   Tramadol                       Not Detected UNEXPECTED ng/mg creat ==================================================================== Test                      Result    Flag   Units      Ref Range   Creatinine              99               mg/dL      >=20 ==================================================================== Declared Medications:  The flagging and interpretation on this report are based on the  following declared medications.  Unexpected results may arise from  inaccuracies in the declared medications.  **Note: The testing scope of this panel includes these medications:  Tapentadol  Tramadol  **Note: The testing scope of this panel does not include following  reported medications:  Acetaminophen  Amantadine  Amlodipine Besylate  Aspirin (Aspirin 81)  Azelastine  B Complex  Baclofen  Benzonatate  Botulinum (Botox)  Carvedilol  Celecoxib  Donepezil  Doxycycline  Esomeprazole  Folic acid  Levothyroxine  Losartan (Losartan Potassium)  Melatonin  Montelukast  Multivitamin (MVI)  Potassium  Prednisone  Tizanidine  Torsemide  Trazodone  Turmeric  Vitamin C  Vitamin D3 ==================================================================== For clinical consultation, please call [phone]. ====================================================================    UDS interpretation: Compliant          Medication Assessment Form: Reviewed. Patient indicates being compliant with therapy Treatment compliance: Compliant Risk Assessment Profile: Aberrant behavior: See initial evaluations. None observed or detected today Comorbid factors increasing risk of overdose: See initial evaluation. No additional risks detected today Opioid risk tool  (ORT):  Opioid Risk  08/09/2018  Alcohol 1  Illegal Drugs 0  Rx Drugs 0  Alcohol 0  Illegal Drugs 0  Rx Drugs 0  Age between 16-45 years  0  History of Preadolescent Sexual Abuse 0  Psychological Disease 0  Depression 1  Opioid Risk Tool Scoring 2  Opioid Risk Interpretation Low Risk    ORT Scoring interpretation table:  Score <3 = Low Risk for SUD  Score between 4-7 = Moderate Risk for SUD  Score >8 = High Risk for Opioid Abuse   Risk of substance use disorder (SUD): Low  Risk Mitigation Strategies:  Patient Counseling: Covered Patient-Prescriber Agreement (PPA): Present and active  Notification to other healthcare providers: Done  Pharmacologic Plan: No change in therapy, at this time.               Meds   Current Outpatient Medications:  .  acetaminophen (TYLENOL) 500 MG tablet, Take 500 mg by mouth every 6 (six) hours as needed., Disp: , Rfl:  .  amantadine (SYMMETREL) 100 MG capsule, Take 100  mg by mouth 2 (two) times daily., Disp: , Rfl:  .  amLODipine (NORVASC) 5 MG tablet, Take 5 mg by mouth daily., Disp: , Rfl:  .  Ascorbic Acid (VITAMIN C) 1000 MG tablet, Take 1,000 mg daily by mouth., Disp: , Rfl:  .  aspirin EC 81 MG tablet, Take 81 mg by mouth daily., Disp: , Rfl:  .  azelastine (ASTELIN) 0.1 % nasal spray, 2 sprays as needed for rhinitis. Use in each nostril as directed, Disp: , Rfl:  .  B Complex Vitamins (VITAMIN-B COMPLEX PO), Take 100 mg daily by mouth., Disp: , Rfl:  .  baclofen (LIORESAL) 20 MG tablet, Take 20 mg by mouth daily as needed for muscle spasms., Disp: , Rfl:  .  carvedilol (COREG) 25 MG tablet, Take 25 mg 4 (four) times daily by mouth. , Disp: , Rfl:  .  celecoxib (CELEBREX) 200 MG capsule, Take 200 mg by mouth 2 (two) times daily., Disp: , Rfl:  .  Cholecalciferol (VITAMIN D3) 2000 units TABS, Take 2,000 Units daily by mouth., Disp: , Rfl:  .  donepezil (ARICEPT) 10 MG tablet, Take 10 mg by mouth at bedtime., Disp: , Rfl:  .  esomeprazole  (NEXIUM) 40 MG capsule, Take 40 mg by mouth daily., Disp: , Rfl:  .  folic acid (FOLVITE) 784 MCG tablet, Take 400 mcg by mouth 2 (two) times daily., Disp: , Rfl:  .  levothyroxine (SYNTHROID, LEVOTHROID) 112 MCG tablet, Take 112 mcg by mouth daily., Disp: , Rfl:  .  Melatonin 3 MG TABS, Take 1 tablet at bedtime by mouth. , Disp: , Rfl:  .  montelukast (SINGULAIR) 10 MG tablet, Take 10 mg by mouth every evening., Disp: , Rfl:  .  Multiple Vitamin (MULTIVITAMIN) tablet, Take 1 tablet by mouth daily., Disp: , Rfl:  .  OnabotulinumtoxinA (BOTOX IJ), Inject every 4 (four) months as directed., Disp: , Rfl:  .  potassium chloride SA (K-DUR,KLOR-CON) 20 MEQ tablet, Take 20 mEq by mouth 3 (three) times daily., Disp: , Rfl:  .  [START ON 08/12/2018] tapentadol (NUCYNTA) 50 MG tablet, Take 1 tablet (50 mg total) by mouth daily as needed for up to 30 days for moderate pain or severe pain. Do not take if you have epilepsy or a history of seizures. Swallow tablets whole. Do not chew, crush or dissolve., Disp: 30 tablet, Rfl: 0 .  tiZANidine (ZANAFLEX) 4 MG tablet, Take 0.5 tablets (2 mg total) by mouth 2 (two) times daily as needed for muscle spasms., Disp: 60 tablet, Rfl: 1 .  torsemide (DEMADEX) 10 MG tablet, Take 10 mg daily by mouth., Disp: , Rfl:  .  traZODone (DESYREL) 50 MG tablet, Take by mouth at bedtime. 50-'150mg'$  nightly as needed, Disp: , Rfl:  .  venlafaxine (EFFEXOR) 75 MG tablet, Take 75 mg by mouth 3 (three) times daily., Disp: , Rfl:  .  benzonatate (TESSALON) 100 MG capsule, Take 1 capsule (100 mg total) by mouth 3 (three) times daily as needed. (Patient not taking: Reported on 08/09/2018), Disp: 21 capsule, Rfl: 0 .  doxycycline (VIBRA-TABS) 100 MG tablet, Take 1 tablet (100 mg total) by mouth 2 (two) times daily. (Patient not taking: Reported on 08/09/2018), Disp: 14 tablet, Rfl: 0 .  losartan (COZAAR) 100 MG tablet, Take 100 mg by mouth daily., Disp: , Rfl:  .  predniSONE (STERAPRED UNI-PAK 21  TAB) 5 MG (21) TBPK tablet, Take 5 mg by mouth daily., Disp: , Rfl:  .  [  START ON 09/11/2018] tapentadol (NUCYNTA) 50 MG tablet, Take 1 tablet (50 mg total) by mouth daily as needed for up to 30 days for moderate pain or severe pain. Do not take if you have epilepsy or a history of seizures. Swallow tablets whole. Do not chew, crush or dissolve., Disp: 30 tablet, Rfl: 0 .  [START ON 10/11/2018] tapentadol (NUCYNTA) 50 MG tablet, Take 1 tablet (50 mg total) by mouth daily as needed for up to 30 days for moderate pain or severe pain. Do not take if you have epilepsy or a history of seizures. Swallow tablets whole. Do not chew, crush or dissolve., Disp: 30 tablet, Rfl: 0 .  traMADol (ULTRAM) 50 MG tablet, Take 50 mg by mouth every 12 (twelve) hours as needed. , Disp: , Rfl:  .  Turmeric 500 MG TABS, Take 500 mg by mouth. 2 tablets one time per day, Disp: , Rfl:   ROS  Constitutional: Denies any fever or chills Gastrointestinal: No reported hemesis, hematochezia, vomiting, or acute GI distress Musculoskeletal: Denies any acute onset joint swelling, redness, loss of ROM, or weakness Neurological: No reported episodes of acute onset apraxia, aphasia, dysarthria, agnosia, amnesia, paralysis, loss of coordination, or loss of consciousness  Allergies  [name] is allergic to codeine; hydromorphone; latex; oxycodone-acetaminophen; sulfa antibiotics; bupropion; gabapentin; hydrocodone; morphine and related; and procaine.  [place]  Drug: [name]  reports no history of drug use. Alcohol:  reports current alcohol use of about 2.0 standard drinks of alcohol per week. Tobacco:  reports that she has never smoked. She has never used smokeless tobacco. 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 Surgical: [name]  has a past surgical history that includes Ankle reconstruction (Left); Brachioplasty; Cataract extraction; Cholecystectomy; Soft Tissue Tumor Resection; Abdominal hysterectomy (07/12/2014); Joint replacement (Bilateral, 07/2014, 03/2015); nasal reconstrucion; Toe Surgery (Right); Shoulder open rotator cuff repair; Colonoscopy with propofol (N/A, 12/07/2016); and Reduction mammaplasty (Bilateral). Family: family history includes Breast cancer in her paternal aunt.  Constitutional Exam  General appearance: Well nourished, well developed, and well hydrated. In no apparent acute distress Vitals:   08/09/18 0855  BP: 138/60  Pulse: 68  Resp: 18  Temp: 98 F (36.7 C)  TempSrc: Oral  SpO2: 100%  Weight: 253 lb 9.6 oz (115 kg)  Height: 5' 6"  (1.676 m)   BMI Assessment: Estimated body mass index is 40.93 kg/m as calculated from the following:   Height as of this encounter: 5' 6"  (1.676 m).   Weight as of this encounter: 253 lb 9.6 oz (115 kg).  BMI interpretation table: BMI level Category Range association with higher incidence of chronic pain  <18 kg/m2 Underweight   18.5-24.9 kg/m2 Ideal body weight   25-29.9 kg/m2 Overweight Increased incidence by 20%  30-34.9 kg/m2 Obese (Class I) Increased incidence by 68%  35-39.9 kg/m2 Severe obesity (Class II) Increased incidence by 136%  >40 kg/m2 Extreme obesity (Class III) Increased incidence by 254%   Patient's current BMI Ideal Body weight  Body mass index is 40.93 kg/m. Ideal body weight: 59.3 kg (130 lb 11.7 oz) Adjusted ideal body weight: 81.6 kg (179 lb 14.1 oz)   BMI Readings from Last 4 Encounters:  08/09/18 40.93 kg/m  05/31/18 41.48 kg/m  03/01/18 46.00 kg/m  01/19/18 46.81 kg/m   Wt Readings from Last 4 Encounters:   08/09/18 253 lb 9.6 oz (115 kg)  05/31/18 257 lb (116.6 kg)  03/01/18 285 lb (129.3 kg)  01/19/18 290 lb (131.5 kg)  Psych/Mental status: Alert, oriented x 3 (person, place, & time)       Eyes: PERLA Respiratory: No evidence of acute respiratory distress  Cervical Spine Area Exam  Skin & Axial Inspection: No masses, redness, edema, swelling, or associated skin lesions Alignment: Symmetrical Functional ROM: Unrestricted ROM      Stability: No instability detected Muscle Tone/Strength: Functionally intact. No obvious neuro-muscular anomalies detected. Sensory (Neurological): Unimpaired Palpation: No palpable anomalies              Upper Extremity (UE) Exam    Side: Right upper extremity  Side: Left upper extremity  Skin & Extremity Inspection: Skin color, temperature, and hair growth are WNL. No peripheral edema or cyanosis. No masses, redness, swelling, asymmetry, or associated skin lesions. No contractures.  Skin & Extremity Inspection: Skin color, temperature, and hair growth are WNL. No peripheral edema or cyanosis. No masses, redness, swelling, asymmetry, or associated skin lesions. No contractures.  Functional ROM: Unrestricted ROM          Functional ROM: Unrestricted ROM          Muscle Tone/Strength: Functionally intact. No obvious neuro-muscular anomalies detected.  Muscle Tone/Strength: Functionally intact. No obvious neuro-muscular anomalies detected.  Sensory (Neurological): Unimpaired          Sensory (Neurological): Unimpaired          Palpation: No palpable anomalies              Palpation: No palpable anomalies              Provocative Test(s):  Phalen's test: deferred Tinel's test: deferred Apley's scratch test (touch opposite shoulder):  Action 1 (Across chest): deferred Action 2 (Overhead): deferred Action 3 (LB reach): deferred   Provocative Test(s):  Phalen's test: deferred Tinel's test: deferred Apley's scratch test (touch opposite shoulder):  Action 1  (Across chest): deferred Action 2 (Overhead): deferred Action 3 (LB reach): deferred    Thoracic Spine Area Exam  Skin & Axial Inspection: No masses, redness, or swelling Alignment: Symmetrical Functional ROM: Unrestricted ROM Stability: No instability detected Muscle Tone/Strength: Functionally intact. No obvious neuro-muscular anomalies detected. Sensory (Neurological): Unimpaired Muscle strength & Tone: No palpable anomalies  Lumbar Spine Area Exam  Skin & Axial Inspection: No masses, redness, or swelling Alignment: Symmetrical Functional ROM: Diminished ROM       Stability: No instability detected Muscle Tone/Strength: Functionally intact. No obvious neuro-muscular anomalies detected. Sensory (Neurological): Musculoskeletal pain pattern Palpation: No palpable anomalies       Provocative Tests: Hyperextension/rotation test: deferred today       Lumbar quadrant test (Kemp's test): deferred today       Lateral bending test: deferred today       Patrick's Maneuver: deferred today                   FABER* test: deferred today                   S-I anterior distraction/compression test: deferred today         S-I lateral compression test: deferred today         S-I Thigh-thrust test: deferred today         S-I Gaenslen's test: deferred today         *(  Flexion, ABduction and External Rotation)  Gait & Posture Assessment  Ambulation: Unassisted Gait: Relatively normal for age and body habitus Posture: WNL   Lower Extremity Exam    Side: Right lower extremity  Side: Left lower extremity  Stability: No instability observed          Stability: No instability observed          Skin & Extremity Inspection: Skin color, temperature, and hair growth are WNL. No peripheral edema or cyanosis. No masses, redness, swelling, asymmetry, or associated skin lesions. No contractures.  Skin & Extremity Inspection: Skin color, temperature, and hair growth are WNL. No peripheral edema or cyanosis.  No masses, redness, swelling, asymmetry, or associated skin lesions. No contractures.  Functional ROM: Unrestricted ROM                  Functional ROM: Unrestricted ROM                  Muscle Tone/Strength: Functionally intact. No obvious neuro-muscular anomalies detected.  Muscle Tone/Strength: Functionally intact. No obvious neuro-muscular anomalies detected.  Sensory (Neurological): Unimpaired        Sensory (Neurological): Unimpaired        DTR: Patellar: deferred today Achilles: deferred today Plantar: deferred today  DTR: Patellar: deferred today Achilles: deferred today Plantar: deferred today  Palpation: No palpable anomalies  Palpation: No palpable anomalies   Assessment   Status Diagnosis  Controlled Controlled Controlled 1. Lumbar spondylosis   2. Lumbar facet arthropathy   3. Lumbar degenerative disc disease   4. Chronic pain syndrome   5. Spasm of lumbar paraspinous muscle      Patient follows up today for medication management.  She is lost approximately 47 pounds since August 2019.  She has been following weight watchers.  Her BMI has decreased from 46-40.  She states that she is feeling better and her pain levels have reduced.  She looks forward to walking more in the spring and summer months.  She states that overall her back is doing better.  Refill medications as below.  UDS up-to-date and appropriate.  Can consider radiofrequency ablation in the future if patient has back pain flare.  Interventional history:   Left L3, L4, L5 RFA 06/09/2017, 12/01/2017   right L3, L4, L5 RFA 06/30/2017, 12/22/2017  Plan of Care  Pharmacotherapy (Medications Ordered): Meds ordered this encounter  Medications  . tapentadol (NUCYNTA) 50 MG tablet    Sig: Take 1 tablet (50 mg total) by mouth daily as needed for up to 30 days for moderate pain or severe pain. Do not take if you have epilepsy or a history of seizures. Swallow tablets whole. Do not chew, crush or dissolve.     Dispense:  30 tablet    Refill:  0    Do not place this medication, or any other prescription from our practice, on "Automatic Refill". Patient may have prescription filled one day early if pharmacy is closed on scheduled refill date.  . tapentadol (NUCYNTA) 50 MG tablet    Sig: Take 1 tablet (50 mg total) by mouth daily as needed for up to 30 days for moderate pain or severe pain. Do not take if you have epilepsy or a history of seizures. Swallow tablets whole. Do not chew, crush or dissolve.    Dispense:  30 tablet    Refill:  0  . tapentadol (NUCYNTA) 50 MG tablet    Sig: Take 1 tablet (50 mg total) by mouth  daily as needed for up to 30 days for moderate pain or severe pain. Do not take if you have epilepsy or a history of seizures. Swallow tablets whole. Do not chew, crush or dissolve.    Dispense:  30 tablet    Refill:  0   Provider-requested follow-up: Return in about 3 months (around 11/07/2018) for Medication Management.  No future appointments.  Primary Care Physician: Kirk Ruths, MD Location: [facility] Note by: Gillis Santa, M.D Date: 08/09/2018; Time: 9:28 AM  Patient Instructions  You have been escribed 3 scripts for Nucynta today to last until 11-11-2018

## 2018-08-09 NOTE — Patient Instructions (Signed)
You have been escribed 3 scripts for Nucynta today to last until 11-11-2018

## 2018-08-09 NOTE — Progress Notes (Deleted)
Patient's Name: [name]  MRN: [id number]  Referring Provider: Kirk Ruths, MD  DOB: May 03, 1946  PCP: Kirk Ruths, MD  DOS: 08/09/2018  Note by: Gillis Santa, MD  Service setting: Ambulatory outpatient  Specialty: Interventional Pain Management  Location: [facility] (AMB) Pain Management Facility    Patient type: Established   Primary Reason(s) for Visit: Encounter for prescription drug management. (Level of risk: moderate)  CC: Back Pain (low)  HPI  [name] is a 73 y.o. year old, female patient, who comes today for a follow-up evaluation. She has Lumbar spondylosis; Lumbar facet arthropathy; and Lumbar degenerative disc disease on their problem list. [name] [name] was last seen on 05/31/2018. Her primarily concern today is the Back Pain (low)  [name] last encounter's weight was 253 lb 9.6 oz (115 kg), with a BMI of 40.93 kg/m. We have pointed out to the patient that as long as her BMI remains above 30, this will adversely affect her pain.  Estimated body mass index is 40.93 kg/m as calculated from the following:   Height as of this encounter: 5' 6"  (1.676 m).   Weight as of this encounter: 253 lb 9.6 oz (115 kg).  Her ideal body weight should be: Ideal body weight: 59.3 kg (130 lb 11.7 oz) Adjusted ideal body weight: 81.6 kg (179 lb 14.1 oz)  Pain Assessment: Location: Lower, Left, Right Back Radiating: denies Onset: More than a month ago Duration: Chronic pain Quality:   Severity: 0-No pain/10 (subjective, self-reported pain score)  Note: Reported level is compatible with observation.                         When using our objective Pain Scale, levels between 6 and 10/10 are said to belong in an emergency room, as it progressively worsens from a 6/10, described as severely limiting, requiring emergency care not usually available at an outpatient pain management facility. At a 6/10 level, communication  becomes difficult and requires great effort. Assistance to reach the emergency department may be required. Facial flushing and profuse sweating along with potentially dangerous increases in heart rate and blood pressure will be evident. Effect on ADL:   Timing: Intermittent(No pain most days, "mostly related to atmospheric pressure) Modifying factors: meds BP: 138/60  HR: 68  Further details on both, my assessment(s), as well as the proposed treatment plan, please see below.  Controlled Substance Pharmacotherapy Assessment REMS (Risk Evaluation and Mitigation Strategy)  Analgesic: *** MME/day: *** mg/day.  No notes on file Pharmacokinetics: Liberation and absorption (onset of action): WNL Distribution (time to peak effect): WNL Metabolism and excretion (duration of action): WNL         Pharmacodynamics: Desired effects: Analgesia: [name] [name] reports >50% benefit. Functional ability: Patient reports that medication allows her to accomplish basic ADLs Clinically meaningful improvement in function (CMIF): Sustained CMIF goals met Perceived effectiveness: Described as relatively effective, allowing for increase in activities of daily living (ADL) Undesirable effects: Side-effects or Adverse reactions: None reported Monitoring: [place] PMP: Online review of the past 61-monthperiod conducted. Compliant with practice rules and regulations Last UDS on record: Summary  Date Value Ref Range Status  05/31/2018 FINAL  Final    Comment:    ==================================================================== TOXASSURE SELECT 13 (MW) ==================================================================== Test  Result       Flag       Units Drug Present and Declared for Prescription Verification   Tapentadol                     3923         EXPECTED   ng/mg creat    Source of tapentadol is a scheduled prescription medication. Drug Absent but Declared for  Prescription Verification   Tramadol                       Not Detected UNEXPECTED ng/mg creat ==================================================================== Test                      Result    Flag   Units      Ref Range   Creatinine              99               mg/dL      >=20 ==================================================================== Declared Medications:  The flagging and interpretation on this report are based on the  following declared medications.  Unexpected results may arise from  inaccuracies in the declared medications.  **Note: The testing scope of this panel includes these medications:  Tapentadol  Tramadol  **Note: The testing scope of this panel does not include following  reported medications:  Acetaminophen  Amantadine  Amlodipine Besylate  Aspirin (Aspirin 81)  Azelastine  B Complex  Baclofen  Benzonatate  Botulinum (Botox)  Carvedilol  Celecoxib  Donepezil  Doxycycline  Esomeprazole  Folic acid  Levothyroxine  Losartan (Losartan Potassium)  Melatonin  Montelukast  Multivitamin (MVI)  Potassium  Prednisone  Tizanidine  Torsemide  Trazodone  Turmeric  Vitamin C  Vitamin D3 ==================================================================== For clinical consultation, please call [phone]. ====================================================================   No results found for: CBDTHCRNo results found for: CBDTHCRMs.RobinRobin RogozinskiHollifield [name] Opioid Risk  08/09/2018  Alcohol 1  Illegal Drugs 0  Rx Drugs 0  Alcohol 0  Illegal Drugs 0  Rx Drugs 0  Age between 16-45 years  0  History of Preadolescent Sexual Abuse 0  Psychological Disease 0  Depression 1  Opioid Risk Tool Scoring 2  Opioid Risk Interpretation Low Risk   Opioid Risk  08/09/2018  Alcohol 1  Illegal Drugs 0  Rx Drugs 0  Alcohol 0  Illegal Drugs 0  Rx Drugs 0  Age between 63-45 years  0  History of Preadolescent Sexual Abuse 0   Psychological Disease 0  Depression 1  Opioid Risk Tool Scoring 2  Opioid Risk Interpretation Low Risk  Robin RogozinskiHollifield RogozinskiMs.RobinRobin RogozinskiHollifield RogozinskishesheMs.RobinRobin RogozinskiHollifield RogozinskiMs.RobinRobin RogozinskiHollifield RogozinskiherherMs.RobinRobin RogozinskiHollifield RogozinskiMs.RobinRobin RogozinskiHollifield RogozinskiherherSheSheSheSheMs.RobinRobin RogozinskiHollifield RogozinskiherherMs.RobinRobin RogozinskiHollifield RogozinskiherherMs.RobinRobin RogozinskiHollifield RogozinskiNo results found for: CRP, ESRSEDRATE, LATICACIDVENNo results found for: CRP, ESRSEDRATE, LATICACIDVENNo results found for: RF, ANA, LABURIC, URICUR, LYMEIGGIGMAB, LYMEABIGMQN, HLAB27No results found for: RF, ANA, LABURIC, URICUR, LYMEIGGIGMAB, LYMEABIGMQN, HLAB27No results found for: BUN, CREATININE, BCR, GFRAA, GFRNONAA, LABVMA, EPIRU, EPINEPH24HUR, NOREPRU, NOREPI24HUR, DOPARU, [id number] results found for: BUN, CREATININE, BCR, GFRAA, GFRNONAA, LABVMA, EPIRU, [id number], NOREPRU, NOREPI24HUR, DOPARU, DOPAM24HRURNo results found for: AST, ALT, ALBUMIN, ALKPHOS, HCVAB, AMYLASE, LIPASE, AMMONIANo results found for: AST, ALT, ALBUMIN, ALKPHOS, HCVAB, AMYLASE, LIPASE, AMMONIANo results found for: NA, K, CL, CALCIUM, MG, PHOSNo results found for: NA, K, CL, CALCIUM, MG, PHOSNo results found for: VITAMINB12, FOLATE, HGBA1C, HIVNo results found for: VITAMINB12, FOLATE, HGBA1C, HIVNo results found for: COLORCSF, APPEARCSF, RBCCOUNTCSF, WBCCSF, POLYSCSF, LYMPHSCSF,  EOSCSF, PROTEINCSF, GLUCCSF, JCVIRUS, CSFOLI, IGGCSFNo results found for: COLORCSF, APPEARCSF, RBCCOUNTCSF, WBCCSF, POLYSCSF, LYMPHSCSF, EOSCSF, PROTEINCSF, GLUCCSF, JCVIRUS, CSFOLI, IGGCSFNo results found for: [place], [id number], [id number], [id number], 25OHVITD1, 25OHVITD2, 25OHVITD3, TESTOFREE, TESTOSTERONENo results found for: [place], [id number], [id number], [id number], 25OHVITD1,  25OHVITD2, 25OHVITD3, TESTOFREE, TESTOSTERONENo results found for: INR, LABPROT, APTT, PLT, DDIMER, LABHEMA, VITAMINK1No results found for: INR, LABPROT, APTT, PLT, DDIMER, LABHEMA, VITAMINK1No results found for: BNP, CKTOTAL, CKMB, TROPONINI, HGB, HCTNo results found for: BNP, CKTOTAL, CKMB, TROPONINI, HGB, HCTNo results found for: CEA, CA125, LABCA2No results found for: CEA, CA125, LABCA2No results found for: TSH, FREET4, TESTOFREE, TESTOSTERONE, ESTRADIOL, ESTRADIOLPCT, ESTRADIOLFRENo results found for: TSH, FREET4, TESTOFREE, TESTOSTERONE, ESTRADIOL, ESTRADIOLPCT, ESTRADIOLFREDG C-Arm 1-60 Min-No Report Fluoroscopy was utilized by the requesting physician.  No radiographic  interpretation.  DG C-Arm 1-60 Min-No Report Fluoroscopy was utilized by the requesting physician.  No radiographic  interpretation.  Robin RogozinskiHollifield RogozinskiMs.RobinRobin RogozinskiHollifield [name] Current Outpatient Medications:  .  acetaminophen (TYLENOL) 500 MG tablet, Take 500 mg by mouth every 6 (six) hours as needed., Disp: , Rfl:  .  amantadine (SYMMETREL) 100 MG capsule, Take 100 mg by mouth 2 (two) times daily., Disp: , Rfl:  .  amLODipine (NORVASC) 5 MG tablet, Take 5 mg by mouth daily., Disp: , Rfl:  .  Ascorbic Acid (VITAMIN C) 1000 MG tablet, Take 1,000 mg daily by mouth., Disp: , Rfl:  .  aspirin EC 81 MG tablet, Take 81 mg by mouth daily., Disp: , Rfl:  .  azelastine (ASTELIN) 0.1 % nasal spray, 2 sprays as needed for rhinitis. Use in each nostril as directed, Disp: , Rfl:  .  B Complex Vitamins (VITAMIN-B COMPLEX PO), Take 100 mg daily by mouth., Disp: , Rfl:  .  baclofen (LIORESAL) 20 MG tablet, Take 20 mg by mouth daily as needed for muscle spasms., Disp: , Rfl:  .  carvedilol (COREG) 25 MG tablet, Take 25 mg 4 (four) times daily by mouth. , Disp: , Rfl:  .  celecoxib (CELEBREX) 200 MG capsule, Take 200 mg by mouth 2 (two) times daily., Disp: , Rfl:  .  Cholecalciferol  (VITAMIN D3) 2000 units TABS, Take 2,000 Units daily by mouth., Disp: , Rfl:  .  donepezil (ARICEPT) 10 MG tablet, Take 10 mg by mouth at bedtime., Disp: , Rfl:  .  esomeprazole (NEXIUM) 40 MG capsule, Take 40 mg by mouth daily., Disp: , Rfl:  .  folic acid (FOLVITE) 466 MCG tablet, Take 400 mcg by mouth 2 (two) times daily., Disp: , Rfl:  .  levothyroxine (SYNTHROID, LEVOTHROID) 112 MCG tablet, Take 112 mcg by mouth daily., Disp: , Rfl:  .  Melatonin 3 MG TABS, Take 1 tablet at bedtime by mouth. , Disp: , Rfl:  .  montelukast (SINGULAIR) 10 MG tablet, Take 10 mg by mouth every evening., Disp: , Rfl:  .  Multiple Vitamin (MULTIVITAMIN) tablet, Take 1 tablet by mouth daily., Disp: , Rfl:  .  OnabotulinumtoxinA (BOTOX IJ), Inject every 4 (four) months as directed., Disp: , Rfl:  .  potassium chloride SA (K-DUR,KLOR-CON) 20 MEQ tablet, Take 20 mEq by mouth 3 (three) times daily., Disp: , Rfl:  .  tapentadol (NUCYNTA) 50 MG tablet, Take 1 tablet (50 mg total) by mouth daily as needed for moderate pain or severe pain. Do not take if you have epilepsy or a history of seizures. Swallow tablets whole. Do not chew, crush or dissolve., Disp: 30 tablet, Rfl: 0 .  tiZANidine (ZANAFLEX) 4 MG tablet,  Take 0.5 tablets (2 mg total) by mouth 2 (two) times daily as needed for muscle spasms., Disp: 60 tablet, Rfl: 1 .  torsemide (DEMADEX) 10 MG tablet, Take 10 mg daily by mouth., Disp: , Rfl:  .  traZODone (DESYREL) 50 MG tablet, Take by mouth at bedtime. 50-155m nightly as needed, Disp: , Rfl:  .  venlafaxine (EFFEXOR) 75 MG tablet, Take 75 mg by mouth 3 (three) times daily., Disp: , Rfl:  .  benzonatate (TESSALON) 100 MG capsule, Take 1 capsule (100 mg total) by mouth 3 (three) times daily as needed. (Patient not taking: Reported on 08/09/2018), Disp: 21 capsule, Rfl: 0 .  doxycycline (VIBRA-TABS) 100 MG tablet, Take 1 tablet (100 mg total) by mouth 2 (two) times daily. (Patient not taking: Reported on 08/09/2018),  Disp: 14 tablet, Rfl: 0 .  losartan (COZAAR) 100 MG tablet, Take 100 mg by mouth daily., Disp: , Rfl:  .  predniSONE (STERAPRED UNI-PAK 21 TAB) 5 MG (21) TBPK tablet, Take 5 mg by mouth daily., Disp: , Rfl:  .  traMADol (ULTRAM) 50 MG tablet, Take 50 mg by mouth every 12 (twelve) hours as needed. , Disp: , Rfl:  .  Turmeric 500 MG TABS, Take 500 mg by mouth. 2 tablets one time per day, Disp: , Rfl:  Current Outpatient Medications:  .  acetaminophen (TYLENOL) 500 MG tablet, Take 500 mg by mouth every 6 (six) hours as needed., Disp: , Rfl:  .  amantadine (SYMMETREL) 100 MG capsule, Take 100 mg by mouth 2 (two) times daily., Disp: , Rfl:  .  amLODipine (NORVASC) 5 MG tablet, Take 5 mg by mouth daily., Disp: , Rfl:  .  Ascorbic Acid (VITAMIN C) 1000 MG tablet, Take 1,000 mg daily by mouth., Disp: , Rfl:  .  aspirin EC 81 MG tablet, Take 81 mg by mouth daily., Disp: , Rfl:  .  azelastine (ASTELIN) 0.1 % nasal spray, 2 sprays as needed for rhinitis. Use in each nostril as directed, Disp: , Rfl:  .  B Complex Vitamins (VITAMIN-B COMPLEX PO), Take 100 mg daily by mouth., Disp: , Rfl:  .  baclofen (LIORESAL) 20 MG tablet, Take 20 mg by mouth daily as needed for muscle spasms., Disp: , Rfl:  .  carvedilol (COREG) 25 MG tablet, Take 25 mg 4 (four) times daily by mouth. , Disp: , Rfl:  .  celecoxib (CELEBREX) 200 MG capsule, Take 200 mg by mouth 2 (two) times daily., Disp: , Rfl:  .  Cholecalciferol (VITAMIN D3) 2000 units TABS, Take 2,000 Units daily by mouth., Disp: , Rfl:  .  donepezil (ARICEPT) 10 MG tablet, Take 10 mg by mouth at bedtime., Disp: , Rfl:  .  esomeprazole (NEXIUM) 40 MG capsule, Take 40 mg by mouth daily., Disp: , Rfl:  .  folic acid (FOLVITE) 4333MCG tablet, Take 400 mcg by mouth 2 (two) times daily., Disp: , Rfl:  .  levothyroxine (SYNTHROID, LEVOTHROID) 112 MCG tablet, Take 112 mcg by mouth daily., Disp: , Rfl:  .  Melatonin 3 MG TABS, Take 1 tablet at bedtime by mouth. , Disp: , Rfl:   .  montelukast (SINGULAIR) 10 MG tablet, Take 10 mg by mouth every evening., Disp: , Rfl:  .  Multiple Vitamin (MULTIVITAMIN) tablet, Take 1 tablet by mouth daily., Disp: , Rfl:  .  OnabotulinumtoxinA (BOTOX IJ), Inject every 4 (four) months as directed., Disp: , Rfl:  .  potassium chloride SA (K-DUR,KLOR-CON) 20 MEQ tablet, Take  20 mEq by mouth 3 (three) times daily., Disp: , Rfl:  .  tapentadol (NUCYNTA) 50 MG tablet, Take 1 tablet (50 mg total) by mouth daily as needed for moderate pain or severe pain. Do not take if you have epilepsy or a history of seizures. Swallow tablets whole. Do not chew, crush or dissolve., Disp: 30 tablet, Rfl: 0 .  tiZANidine (ZANAFLEX) 4 MG tablet, Take 0.5 tablets (2 mg total) by mouth 2 (two) times daily as needed for muscle spasms., Disp: 60 tablet, Rfl: 1 .  torsemide (DEMADEX) 10 MG tablet, Take 10 mg daily by mouth., Disp: , Rfl:  .  traZODone (DESYREL) 50 MG tablet, Take by mouth at bedtime. 50-185m nightly as needed, Disp: , Rfl:  .  venlafaxine (EFFEXOR) 75 MG tablet, Take 75 mg by mouth 3 (three) times daily., Disp: , Rfl:  .  benzonatate (TESSALON) 100 MG capsule, Take 1 capsule (100 mg total) by mouth 3 (three) times daily as needed. (Patient not taking: Reported on 08/09/2018), Disp: 21 capsule, Rfl: 0 .  doxycycline (VIBRA-TABS) 100 MG tablet, Take 1 tablet (100 mg total) by mouth 2 (two) times daily. (Patient not taking: Reported on 08/09/2018), Disp: 14 tablet, Rfl: 0 .  losartan (COZAAR) 100 MG tablet, Take 100 mg by mouth daily., Disp: , Rfl:  .  predniSONE (STERAPRED UNI-PAK 21 TAB) 5 MG (21) TBPK tablet, Take 5 mg by mouth daily., Disp: , Rfl:  .  traMADol (ULTRAM) 50 MG tablet, Take 50 mg by mouth every 12 (twelve) hours as needed. , Disp: , Rfl:  .  Turmeric 500 MG TABS, Take 500 mg by mouth. 2 tablets one time per day, Disp: , Rfl: Robin RogozinskiHollifield Rogozinskiis allergic to codeine; hydromorphone; latex; oxycodone-acetaminophen;  sulfa antibiotics; bupropion; gabapentin; hydrocodone; morphine and related; and procaine.is allergic to codeine; hydromorphone; latex; oxycodone-acetaminophen; sulfa antibiotics; bupropion; gabapentin; hydrocodone; morphine and related; and procaine.Robin RogozinskiHollifield [name] reports no history of drug use. reports no history of drug use. reports current alcohol use of about 2.0 standard drinks of alcohol per week. reports current alcohol use of about 2.0 standard drinks of alcohol per week. reports that she has never smoked. She has never used smokeless tobacco. reports that she has never smoked. She has never used smokeless tobacco. 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 has a past medical history of Benign positional vertigo (03/04/2016), [place], Climacteric, Depression, DJD (degenerative joint disease), Dyslipidemia, GERD (gastroesophageal reflux disease), Hypertension, Hypokalemia, Hypothyroidism, IBS (irritable bowel syndrome), OAB (overactive bladder), Ovarian cyst (03/25/2015), Rhinitis, Sleep apnea, and Spasm of thoracolumbar muscle.Robin RogozinskiHollifield [name] has a past surgical history that includes Ankle reconstruction (Left); Brachioplasty; Cataract extraction; Cholecystectomy; Soft Tissue Tumor Resection; Abdominal hysterectomy (07/12/2014); Joint replacement (Bilateral, 07/2014, 03/2015); nasal reconstrucion; Toe Surgery (Right); Shoulder open rotator cuff repair; Colonoscopy with propofol (N/A, 12/07/2016); and Reduction mammaplasty (Bilateral). has a past surgical history that includes Ankle  reconstruction (Left); Brachioplasty; Cataract extraction; Cholecystectomy; Soft Tissue Tumor Resection; Abdominal hysterectomy (07/12/2014); Joint replacement (Bilateral, 07/2014, 03/2015); nasal reconstrucion; Toe Surgery (Right); Shoulder open rotator cuff repair; Colonoscopy with propofol (N/A, 12/07/2016); and Reduction mammaplasty (Bilateral).family history includes Breast cancer in her paternal aunt.family history includes Breast cancer in her paternal aunt. Vitals:   08/09/18 0855  BP: 138/60  Pulse: 68  Resp: 18  Temp: 98 F (36.7 C)  TempSrc: Oral  SpO2: 100%  Weight: 253 lb 9.6 oz (115 kg)  Height: 5' 6"  (1.676 m)   Vitals:   08/09/18 0855  BP: 138/60  Pulse: 68  Resp: 18  Temp: 98 F (36.7 C)  TempSrc: Oral  SpO2: 100%  Weight: 253 lb 9.6 oz (115 kg)  Height: 5' 6"  (1.676 m)  Estimated body mass index is 40.93 kg/m as calculated from the following:   Height as of this encounter: 5' 6"  (1.676 m).   Weight as of this encounter: 253 lb 9.6 oz (115 kg).Estimated body mass index is 40.93 kg/m as calculated from the following:   Height as of this encounter: 5' 6"  (1.676 m).   Weight as of this encounter: 253 lb 9.6 oz (115 kg).Body mass index is 40.93 kg/m.Body mass index is 40.93 kg/m.Ideal body weight: 59.3 kg (130 lb 11.7 oz) Adjusted ideal body weight: 81.6 kg (179 lb 14.1 oz)Ideal body weight: 59.3 kg (130 lb 11.7 oz) Adjusted ideal body weight: 81.6 kg (179 lb 14.1 oz) BMI Readings from Last 4 Encounters:  08/09/18 40.93 kg/m  05/31/18 41.48 kg/m  03/01/18 46.00 kg/m  01/19/18 46.81 kg/m   BMI Readings from Last 4 Encounters:  08/09/18 40.93 kg/m  05/31/18 41.48 kg/m  03/01/18 46.00 kg/m  01/19/18 46.81 kg/m   Wt Readings from Last 4 Encounters:  08/09/18 253 lb 9.6 oz (115 kg)  05/31/18 257 lb (116.6 kg)  03/01/18 285 lb (129.3 kg)  01/19/18 290 lb (131.5 kg)   Wt Readings from Last 4 Encounters:  08/09/18 253 lb 9.6 oz (115 kg)  05/31/18 257  lb (116.6 kg)  03/01/18 285 lb (129.3 kg)  01/19/18 290 lb (131.5 kg)  Robin RogozinskiHollifield RogozinskiNo diagnosis found.No diagnosis found.No problems updated.No problems updated.No orders of the defined types were placed in this encounter. No orders of the defined types were placed in this encounter. No orders of the defined types were placed in this encounter. No orders of the defined types were placed in this encounter. Robin RogozinskiHollifield RogozinskiMs.RobinRobin RogozinskiHollifield RogozinskiMs.RobinRobin RogozinskiHollifield RogozinskiMs.RobinRobin RogozinskiHollifield RogozinskiMs.RobinRobin RogozinskiHollifield RogozinskiMs.RobinRobin RogozinskiHollifield RogozinskiNo follow-ups on file.No follow-ups on file.No future appointments.No future appointments.Kirk Ruths, MDAnderson, Ocie Cornfield, MD2/25/20202/25/20209:06 AM9:07 AMThere are no Patient Instructions on file for this visit.

## 2018-08-09 NOTE — Progress Notes (Signed)
Nursing Pain Medication Assessment:  Safety precautions to be maintained throughout the outpatient stay will include: orient to surroundings, keep bed in low position, maintain call bell within reach at all times, provide assistance with transfer out of bed and ambulation.  Medication Inspection Compliance: Pill count conducted under aseptic conditions, in front of the patient. Neither the pills nor the bottle was removed from the patient's sight at any time. Once count was completed pills were immediately returned to the patient in their original bottle.  Medication: Tapentadol (Nucynta) Pill/Patch Count: 7 of 30 pills remain Pill/Patch Appearance: Markings consistent with prescribed medication Bottle Appearance: Standard pharmacy container. Clearly labeled. Filled Date:01/29/ 2020 Last Medication intake:  Today

## 2018-10-27 ENCOUNTER — Encounter: Payer: Medicare Other | Admitting: Student in an Organized Health Care Education/Training Program

## 2018-11-09 ENCOUNTER — Encounter: Payer: Self-pay | Admitting: Student in an Organized Health Care Education/Training Program

## 2018-11-10 ENCOUNTER — Ambulatory Visit
Payer: Medicare Other | Attending: Student in an Organized Health Care Education/Training Program | Admitting: Student in an Organized Health Care Education/Training Program

## 2018-11-10 ENCOUNTER — Encounter: Payer: Self-pay | Admitting: Student in an Organized Health Care Education/Training Program

## 2018-11-10 ENCOUNTER — Other Ambulatory Visit: Payer: Self-pay

## 2018-11-10 DIAGNOSIS — M5136 Other intervertebral disc degeneration, lumbar region: Secondary | ICD-10-CM | POA: Diagnosis not present

## 2018-11-10 DIAGNOSIS — G894 Chronic pain syndrome: Secondary | ICD-10-CM

## 2018-11-10 DIAGNOSIS — M51369 Other intervertebral disc degeneration, lumbar region without mention of lumbar back pain or lower extremity pain: Secondary | ICD-10-CM

## 2018-11-10 DIAGNOSIS — M47816 Spondylosis without myelopathy or radiculopathy, lumbar region: Secondary | ICD-10-CM | POA: Diagnosis not present

## 2018-11-10 MED ORDER — TAPENTADOL HCL 50 MG PO TABS: 50.0000 mg | ORAL_TABLET | Freq: Every day | ORAL | 0 refills | Status: AC | PRN

## 2018-11-10 NOTE — Progress Notes (Signed)
Pain Management Virtual Encounter Note - Virtual Visit via Video Conference Telehealth (real-time audio visits between healthcare provider and patient).  Patient's Phone No. & Preferred Pharmacy:  [phone] (home); [phone] (mobile); (Preferred) [phone] [email or web address]  [facility][id number] [facility]oles Rough- [place], [place] - [address] [facility] [address] [place] [id number] Phone: [phone] Fax: [phone]   Pre-screening note:  Our staff contacted [name] and offered her an "in person", "face-to-face" appointment versus a telephone encounter. She indicated preferring the telephone encounter, at this time.  Reason for Virtual Visit: COVID-19*  Social distancing based on CDC and AMA recommendations.   I contacted [name] on 11/10/2018 at 9:30 AM via video conference.      I clearly identified myself as Edward JollyBilal Myles Mallicoat, MD. I verified that I was speaking with the correct person using two identifiers (Name and date of birth: 07/29/1945).  Advanced Informed Consent I sought verbal advanced consent from Robin MoseLinda J Hollifield [name] for virtual visit interactions. I informed [name] of possible security and privacy concerns, risks, and limitations associated with providing "not-in-person" medical evaluation and management services. I also informed [name] of the availability of "in-person" appointments. Finally, I informed her that there would be a charge for the virtual visit and that she could be  personally, fully or partially, financially responsible for it. [name] expressed understanding and agreed to proceed.   Historic Elements   [name] is a 73 y.o. year old, female patient evaluated today after her last encounter by our practice on 08/09/2018. [name]  has a past medical history of Benign positional  vertigo (03/04/2016), Brain injury (HCC), Climacteric, Depression, DJD (degenerative joint disease), Dyslipidemia, GERD (gastroesophageal reflux disease), Hypertension, Hypokalemia, Hypothyroidism, IBS (irritable bowel syndrome), OAB (overactive bladder), Ovarian cyst (03/25/2015), Rhinitis, Sleep apnea, and Spasm of thoracolumbar muscle. She also  has a past surgical history that includes Ankle reconstruction (Left); Brachioplasty; Cataract extraction; Cholecystectomy; Soft Tissue Tumor Resection; Abdominal hysterectomy (07/12/2014); Joint replacement (Bilateral, 07/2014, 03/2015); nasal reconstrucion; Toe Surgery (Right); Shoulder open rotator cuff repair; Colonoscopy with propofol (N/A, 12/07/2016); and Reduction mammaplasty (Bilateral). [name] has a current medication list which includes the following prescription(s): acetaminophen, amantadine, amlodipine, vitamin c, aspirin ec, azelastine, b complex vitamins, baclofen, carvedilol, celecoxib, vitamin d3, donepezil, esomeprazole, folic acid, levothyroxine, losartan, melatonin, montelukast, multivitamin, onabotulinumtoxina, potassium chloride sa, tapentadol, tizanidine, torsemide, trazodone, turmeric, venlafaxine, prednisone, tapentadol, tapentadol, and tramadol. She  reports that she has never smoked. She has never used smokeless tobacco. She reports current alcohol use of about 2.0 standard drinks of alcohol per week. She reports that she does not use drugs. [name] is allergic to codeine; hydromorphone; latex; oxycodone-acetaminophen; sulfa antibiotics; bupropion; gabapentin; hydrocodone; morphine and related; and procaine.   HPI  I last communicated with her on 08/09/2018. Today, she is being contacted for worsening of previously known (established) problem and medication management  Of note, patient's last visit with me was on 08/09/2018.  Since that time, she has had no significant change in her medical history.  She  states that her low back pain seems to be returning to the same level as before her lumbar radiofrequency ablation that was performed.  After her lumbar radiofrequency ablation last year performed on 12/01/2017 (left L3, L4, L5 RFA), followed by right L3, L4, L5 on 12/22/2017 patient has lost a significant amount of weight on weight  watchers and exercise.  She states that since the gym has been closed, she has been unable to exercise as much and has gained a few pounds back.  Patient is interested in repeating lumbar radiofrequency ablation with the attempts of gaining pain relief in her low back and buttocks.  She states that the lumbar radiofrequency ablation that she had done in June and July of last year was effective for approximately 10 to 11 months in managing her low back pain.  Otherwise refill Nucynta as below and schedule for repeat left L3, L4, L5 RFA followed by right.  Pharmacotherapy Assessment   10/14/2018  2   08/09/2018  Nucynta 50 MG Tablet  30.00 30 Bi Lat   [id number]   Wal ([id number])   0  20.00 MME  Comm Ins   [place]     Monitoring: Pharmacotherapy: No side-effects or adverse reactions reported. [place] PMP: PDMP reviewed during this encounter.       Compliance: No problems identified. Effectiveness: Clinically acceptable. Plan: Refer to "POC".  Pertinent Labs  Renal Function No results found for: BUN, CREATININE, BCR, GFRAA, GFRNONAA Hepatic Function No results found for: AST, ALT, ALBUMIN UDS Summary  Date Value Ref Range Status  05/31/2018 FINAL  Final    Comment:    ==================================================================== TOXASSURE SELECT 13 (MW) ==================================================================== Test                             Result       Flag       Units Drug Present and Declared for Prescription Verification   Tapentadol                     3923         EXPECTED   ng/mg creat    Source of tapentadol is a scheduled prescription  medication. Drug Absent but Declared for Prescription Verification   Tramadol                       Not Detected UNEXPECTED ng/mg creat ==================================================================== Test                      Result    Flag   Units      Ref Range   Creatinine              99               mg/dL      >=45 ==================================================================== Declared Medications:  The flagging and interpretation on this report are based on the  following declared medications.  Unexpected results may arise from  inaccuracies in the declared medications.  **Note: The testing scope of this panel includes these medications:  Tapentadol  Tramadol  **Note: The testing scope of this panel does not include following  reported medications:  Acetaminophen  Amantadine  Amlodipine Besylate  Aspirin (Aspirin 81)  Azelastine  B Complex  Baclofen  Benzonatate  Botulinum (Botox)  Carvedilol  Celecoxib  Donepezil  Doxycycline  Esomeprazole  Folic acid  Levothyroxine  Losartan (Losartan Potassium)  Melatonin  Montelukast  Multivitamin (MVI)  Potassium  Prednisone  Tizanidine  Torsemide  Trazodone  Turmeric  Vitamin C  Vitamin D3 ==================================================================== For clinical consultation, please call [phone]. ====================================================================    Note: Above Lab results reviewed.  Recent imaging  DG C-Arm 1-60 Min-No Report Fluoroscopy was utilized by the requesting  physician.  No radiographic  interpretation.   Assessment  The primary encounter diagnosis was Lumbar spondylosis. Diagnoses of Lumbar facet arthropathy, Lumbar degenerative disc disease, and Chronic pain syndrome were also pertinent to this visit.  Plan of Care  I have discontinued [name]. Hollifield [name]'s doxycycline and benzonatate. I am also having her start on tapentadol and tapentadol.  Additionally, I am having her maintain her amantadine, amLODipine, aspirin EC, baclofen, carvedilol, celecoxib, donepezil, esomeprazole, folic acid, losartan, Melatonin, montelukast, multivitamin, potassium chloride SA, levothyroxine, traMADol, traZODone, venlafaxine, torsemide, B Complex Vitamins (VITAMIN-B COMPLEX PO), vitamin C, Vitamin D3, OnabotulinumtoxinA (BOTOX IJ), azelastine, predniSONE, Turmeric, acetaminophen, tiZANidine, and tapentadol.  Pharmacotherapy (Medications Ordered): Meds ordered this encounter  Medications  . tapentadol (NUCYNTA) 50 MG tablet    Sig: Take 1 tablet (50 mg total) by mouth daily as needed for up to 30 days for moderate pain or severe pain. Do not take if you have epilepsy or a history of seizures. Swallow tablets whole. Do not chew, crush or dissolve.    Dispense:  30 tablet    Refill:  0  . tapentadol (NUCYNTA) 50 MG tablet    Sig: Take 1 tablet (50 mg total) by mouth daily as needed for up to 30 days for moderate pain or severe pain. Do not take if you have epilepsy or a history of seizures. Swallow tablets whole. Do not chew, crush or dissolve.    Dispense:  30 tablet    Refill:  0    [place] STOP ACT - Not applicable. Fill one day early if pharmacy is closed on scheduled refill date.  . tapentadol (NUCYNTA) 50 MG tablet    Sig: Take 1 tablet (50 mg total) by mouth daily as needed for up to 30 days for moderate pain or severe pain. Do not take if you have epilepsy or a history of seizures. Swallow tablets whole. Do not chew, crush or dissolve.    Dispense:  30 tablet    Refill:  0    [place] STOP ACT - Not applicable. Fill one day early if pharmacy is closed on scheduled refill date. *.   Orders:  Orders Placed This Encounter  Procedures  . Radiofrequency,Lumbar    Standing Status:   Future    Standing Expiration Date:   05/12/2020    Scheduling Instructions:     Side(s): Left-sided     Level: L3-4, L4-5, & L5-S1 Facets (L2, L3, L4, L5, Medial Branch Nerves)      Sedation: With Sedation     Scheduling Timeframe: As soon as pre-approved    Order Specific Question:   Where will this procedure be performed?    Answer:   [facility]   Follow-up plan:   Return for Procedure- Left L3, 4, 5, RFA.    I discussed the assessment and treatment plan with the patient. The patient was provided an opportunity to ask questions and all were answered. The patient agreed with the plan and demonstrated an understanding of the instructions.  Patient advised to call back or seek an in-person evaluation if the symptoms or condition worsens.  Total duration of non-face-to-face encounter: 25 minutes.  Note by: Edward Jolly, MD Date: 11/10/2018; Time: 9:30 AM  Note: This dictation was prepared with Dragon dictation. Any transcriptional errors that may result from this process are unintentional.  Disclaimer:  * Given the special circumstances of the COVID-19 pandemic, the federal government has announced that the Office for Civil Rights (OCR) will exercise  its enforcement discretion and will not impose penalties on physicians using telehealth in the event of noncompliance with regulatory requirements under the [place] and Accountability Act (HIPAA) in connection with the good faith provision of telehealth during the PODGW-15 national public health emergency. ([place])

## 2018-11-24 ENCOUNTER — Other Ambulatory Visit: Payer: Self-pay | Admitting: Internal Medicine

## 2018-11-24 DIAGNOSIS — Z1231 Encounter for screening mammogram for malignant neoplasm of breast: Secondary | ICD-10-CM

## 2018-11-28 ENCOUNTER — Other Ambulatory Visit: Payer: Self-pay

## 2018-11-28 ENCOUNTER — Ambulatory Visit
Admission: RE | Admit: 2018-11-28 | Discharge: 2018-11-28 | Disposition: A | Discharge status: Home / Self Care | Payer: Medicare Other | Source: Ambulatory Visit | Attending: Internal Medicine | Admitting: Internal Medicine

## 2018-11-28 DIAGNOSIS — Z1231 Encounter for screening mammogram for malignant neoplasm of breast: Secondary | ICD-10-CM | POA: Diagnosis present

## 2018-12-09 ENCOUNTER — Other Ambulatory Visit
Admission: RE | Admit: 2018-12-09 | Discharge: 2018-12-09 | Disposition: A | Discharge status: Home / Self Care | Payer: Medicare Other | Source: Ambulatory Visit | Attending: Anesthesiology | Admitting: Anesthesiology

## 2018-12-09 ENCOUNTER — Other Ambulatory Visit: Payer: Self-pay

## 2018-12-09 DIAGNOSIS — Z1159 Encounter for screening for other viral diseases: Secondary | ICD-10-CM | POA: Diagnosis present

## 2018-12-10 LAB — NOVEL CORONAVIRUS, NAA (HOSP ORDER, SEND-OUT TO REF LAB; TAT 18-24 HRS): SARS-CoV-2, NAA: NOT DETECTED

## 2018-12-14 ENCOUNTER — Encounter: Payer: Self-pay | Admitting: Student in an Organized Health Care Education/Training Program

## 2018-12-14 ENCOUNTER — Ambulatory Visit
Admission: RE | Admit: 2018-12-14 | Discharge: 2018-12-14 | Disposition: A | Discharge status: Home / Self Care | Payer: Medicare Other | Source: Ambulatory Visit | Attending: Student in an Organized Health Care Education/Training Program | Admitting: Student in an Organized Health Care Education/Training Program

## 2018-12-14 ENCOUNTER — Ambulatory Visit (HOSPITAL_BASED_OUTPATIENT_CLINIC_OR_DEPARTMENT_OTHER): Payer: Medicare Other | Admitting: Student in an Organized Health Care Education/Training Program

## 2018-12-14 ENCOUNTER — Other Ambulatory Visit: Payer: Self-pay

## 2018-12-14 DIAGNOSIS — M47816 Spondylosis without myelopathy or radiculopathy, lumbar region: Secondary | ICD-10-CM

## 2018-12-14 MED ORDER — LIDOCAINE HCL 2 % IJ SOLN
INTRAMUSCULAR | Status: AC
  Filled 2018-12-14: qty 20

## 2018-12-14 MED ORDER — DEXAMETHASONE SODIUM PHOSPHATE 10 MG/ML IJ SOLN
10.0000 mg | Freq: Once | INTRAMUSCULAR | Status: AC
  Administered 2018-12-14: 09:00:00 10 mg

## 2018-12-14 MED ORDER — DEXAMETHASONE SODIUM PHOSPHATE 10 MG/ML IJ SOLN
INTRAMUSCULAR | Status: AC
  Filled 2018-12-14: qty 1

## 2018-12-14 MED ORDER — ROPIVACAINE HCL 2 MG/ML IJ SOLN
INTRAMUSCULAR | Status: AC
  Filled 2018-12-14: qty 10

## 2018-12-14 MED ORDER — LIDOCAINE HCL 2 % IJ SOLN
20.0000 mL | Freq: Once | INTRAMUSCULAR | Status: AC
  Administered 2018-12-14: 09:00:00 400 mg

## 2018-12-14 MED ORDER — FENTANYL CITRATE (PF) 100 MCG/2ML IJ SOLN
INTRAMUSCULAR | Status: AC
  Filled 2018-12-14: qty 2

## 2018-12-14 MED ORDER — FENTANYL CITRATE (PF) 100 MCG/2ML IJ SOLN
25.0000 ug | INTRAMUSCULAR | Status: DC | PRN
  Administered 2018-12-14: 09:00:00 50 ug via INTRAVENOUS

## 2018-12-14 MED ORDER — ROPIVACAINE HCL 2 MG/ML IJ SOLN
1.0000 mL | Freq: Once | INTRAMUSCULAR | Status: AC
  Administered 2018-12-14: 2 mL via EPIDURAL

## 2018-12-14 NOTE — Patient Instructions (Signed)
Post-procedure Information What to expect: Most procedures involve the use of a local anesthetic (numbing medicine), and a steroid (anti-inflammatory medicine).  The local anesthetics may cause temporary numbness and weakness of the legs or arms, depending on the location of the block. This numbness/weakness may last 4-6 hours, depending on the local anesthetic used. In rare instances, it can last up to 24 hours. While numb, you must be very careful not to injure the extremity.  After any procedure, you could expect the pain to get better within 15-20 minutes. This relief is temporary and may last 4-6 hours. Once the local anesthetics wears off, you could experience discomfort, possibly more than usual, for up to 10 (ten) days. In the case of radiofrequencies, it may last up to 6 weeks. Surgeries may take up to 8 weeks for the healing process. The discomfort is due to the irritation caused by needles going through skin and muscle. To minimize the discomfort, we recommend using ice the first day, and heat from then on. The ice should be applied for 15 minutes on, and 15 minutes off. Keep repeating this cycle until bedtime. Avoid applying the ice directly to the skin, to prevent frostbite. Heat should be used daily, until the pain improves (4-10 days). Be careful not to burn yourself.  Occasionally you may experience muscle spasms or cramps. These occur as a consequence of the irritation caused by the needle sticks to the muscle and the blood that will inevitably be lost into the surrounding muscle tissue. Blood tends to be very irritating to tissues, which tend to react by going into spasm. These spasms may start the same day of your procedure, but they may also take days to develop. This late onset type of spasm or cramp is usually caused by electrolyte imbalances triggered by the steroids, at the level of the kidney. Cramps and spasms tend to respond well to muscle relaxants, multivitamins (some are  triggered by the procedure, but may have their origins in vitamin deficiencies), and "Gatorade", or any sports drinks that can replenish any electrolyte imbalances. (If you are a diabetic, ask your pharmacist to get you a sugar-free brand.) Warm showers or baths may also be helpful. Stretching exercises are highly recommended. General Instructions:  Be alert for signs of possible infection: redness, swelling, heat, red streaks, elevated temperature, and/or fever. These typically appear 4 to 6 days after the procedure. Immediately notify your doctor if you experience unusual bleeding, difficulty breathing, or loss of bowel or bladder control. If you experience increased pain, do not increase your pain medicine intake, unless instructed by your pain physician. Post-Procedure Care:  Be careful in moving about. Muscle spasms in the area of the injection may occur. Applying ice or heat to the area is often helpful. The incidence of spinal headaches after epidural injections ranges between 1.4% and 6%. If you develop a headache that does not seem to respond to conservative therapy, please let your physician know. This can be treated with an epidural blood patch.   Post-procedure numbness or redness is to be expected, however it should average 4 to 6 hours. If numbness and weakness of your extremities begins to develop 4 to 6 hours after your procedure, and is felt to be progressing and worsening, immediately contact your physician.   Diet:  If you experience nausea, do not eat until this sensation goes away. If you had a "Stellate Ganglion Block" for upper extremity "Reflex Sympathetic Dystrophy", do not eat or drink until your   hoarseness goes away. In any case, always start with liquids first and if you tolerate them well, then slowly progress to more solid foods. Activity:  For the first 4 to 6 hours after the procedure, use caution in moving about as you may experience numbness and/or weakness. Use caution in  cooking, using household electrical appliances, and climbing steps. If you need to reach your Doctor call our office: [phone] Monday-Thursday 8:00 am - 4:00 PM    Fridays: Closed     In case of an emergency: In case of emergency, call 911 or go to the nearest emergency room and have the physician there call us.  Interpretation of Procedure Every nerve block has two components: a diagnostic component, and a treatment component. Unrealistic expectations are the most common causes of "perceived failure".  In a perfect world, a single nerve block should be able to completely and permanently eliminate the pain. Sadly, the world is not perfect.  Most pain management nerve blocks are performed using local anesthetics and steroids. Steroids are responsible for any long-term benefit that you may experience. Their purpose is to decrease any chronic swelling that may exist in the area. Steroids begin to work immediately after being injected. However, most patients will not experience any benefits until 5 to 10 days after the injection, when the swelling has come down to the point where they can tell a difference. Steroids will only help if there is swelling to be treated. As such, they can assist with the diagnosis. If effective, they suggest an inflammatory component to the pain, and if ineffective, they rule out inflammation as the main cause or component of the problem. If the problem is one of mechanical compression, you will get no benefit from those steroids.   In the case of local anesthetics, they have a crucial role in the diagnosis of your condition. Most will begin to work within15 to 20 minutes after injection. The duration will depend on the type used (short- vs. Long-acting). It is of outmost importance that patients keep tract of their pain, after the procedure. To assist with this matter, a "Post-procedure Pain Diary" is provided. Make sure to complete it and to bring it back to your  follow-up appointment.  As long as the patient keeps accurate, detailed records of their symptoms after every procedure, and returns to have those interpreted, every procedure will provide us with invaluable information. Even a block that does not provide the patient with any relief, will always provide us with information about the mechanism and the origin of the pain. The only time a nerve block can be considered a waste of time is when patients do not keep track of the results, or do not keep their post-procedure appointment.  Reporting the results back to your physician The Pain Score  Pain is a subjective complaint. It cannot be seen, touched, or measured. We depend entirely on the patient's report of the pain in order to assess your condition and treatment. To evaluate the pain, we use a pain scale, where "0" means "No Pain", and a "10" is "the worst possible pain that you can even imagine" (i.e. something like been eaten alive by a shark or being torn apart by a lion).   You will frequently be asked to rate your pain. Please be as accurate, remember that medical decisions will be based on your responses. Please do not rate your pain above a 10. Doing so is actually interpreted as "symptom magnification" (exaggeration), as   well as lack of understanding with regards to the scale. To put this into perspective, when you tell us that your pain is at a 10 (ten), what you are saying is that there is nothing we can do to make this pain any worse. (Carefully think about that.) Preparing for Procedure with Sedation Instructions: . Oral Intake: Do not eat or drink anything for at least 8 hours prior to your procedure. . Transportation: Public transportation is not allowed. Bring an adult driver. The driver must be physically present in our waiting room before any procedure can be started. . Physical Assistance: Bring an adult capable of physically assisting you, in the event you need help. . Blood Pressure  Medicine: Take your blood pressure medicine with a sip of water the morning of the procedure. . Insulin: Take only  of your normal insulin dose. . Preventing infections: Shower with an antibacterial soap the morning of your procedure. . Build-up your immune system: Take 1000 mg of Vitamin C with every meal (3 times a day) the day prior to your procedure. . Pregnancy: If you are pregnant, call and cancel the procedure. . Sickness: If you have a cold, fever, or any active infections, call and cancel the procedure. . Arrival: You must be in the facility at least 30 minutes prior to your scheduled procedure. . Children: Do not bring children with you. . Dress appropriately: Bring dark clothing that you would not mind if they get stained. . Valuables: Do not bring any jewelry or valuables. Procedure appointments are reserved for interventional treatments only. . No Prescription Refills. . No medication changes will be discussed during procedure appointments. . No disability issues will be discussed. .  

## 2018-12-14 NOTE — Progress Notes (Signed)
Patient's Name: [name]  MRN: [id number]  Referring Provider: Edward JollyLateef, Mahalia Dykes, MD  DOB: 04/15/1946  PCP: Lauro RegulusAnderson, Marshall W, MD  DOS: 12/14/2018  Note by: Edward JollyBilal Estreya Clay, MD  Service setting: Ambulatory outpatient  Specialty: Interventional Pain Management  Patient type: Established  Location: [facility] (AMB) Pain Management Facility  Visit type: Interventional Procedure   Primary Reason for Visit: Interventional Pain Management Treatment. CC: Back Pain (lower)  Procedure:       Anesthesia, Analgesia, Anxiolysis:  Type: Thermal Lumbar Facet, Medial Branch Radiofrequency Ablation/Neurotomy Level:L3, L4, L5,  Medial Branch Level(s). These levels will denervate the L4-5, and the L5-S1 lumbar facet joints. Primary Purpose: Therapeutic Region: Posterolateral Lumbosacral Spine Laterality: Left  (#1 done 12/01/2017 left L3, L4, L5 RFA)   Type: Moderate (Conscious) Sedation combined with Local Anesthesia Indication(s): Analgesia and Anxiety Route: Intravenous (IV) IV Access: Secured Sedation: Meaningful verbal contact was maintained at all times during the procedure  Local Anesthetic: Lidocaine 1%   Indications: 1. Lumbar spondylosis    [name] has been dealing with the above chronic pain for longer than three months and has either failed to respond, was unable to tolerate, or simply did not get enough benefit from other more conservative therapies including, but not limited to: 1. Over-the-counter medications 2. Anti-inflammatory medications 3. Muscle relaxants 4. Membrane stabilizers 5. Opioids 6. Physical therapy 7. Modalities (Heat, ice, etc.) 8. Invasive techniques such as nerve blocks. [name] has attained more than 50% relief of the pain from a series of diagnostic injections conducted in separate occasions.  Pain Score: Pre-procedure: 6 /10 Post-procedure: 0-No pain/10  Pre-op Assessment:  [name] is a 73 y.o.  (year old), female patient, seen today for interventional treatment. She  has a past surgical history that includes Ankle reconstruction (Left); Brachioplasty; Cataract extraction; Cholecystectomy; Soft Tissue Tumor Resection; Abdominal hysterectomy (07/12/2014); Joint replacement (Bilateral, 07/2014, 03/2015); nasal reconstrucion; Toe Surgery (Right); Shoulder open rotator cuff repair; Colonoscopy with propofol (N/A, 12/07/2016); and Reduction mammaplasty (Bilateral). [name] has a current medication list which includes the following prescription(s): acetaminophen, amantadine, amlodipine, vitamin c, aspirin ec, azelastine, b complex vitamins, baclofen, carvedilol, celecoxib, vitamin d3, donepezil, esomeprazole, folic acid, levothyroxine, losartan, melatonin, montelukast, multivitamin, onabotulinumtoxina, potassium chloride sa, prednisone, tapentadol, tapentadol, tizanidine, torsemide, tramadol, trazodone, turmeric, and venlafaxine, and the following Facility-Administered Medications: fentanyl. Her primarily concern today is the Back Pain (lower)  Initial Vital Signs:  Pulse/HCG Rate: 71ECG Heart Rate: 74 Temp: 98 F (36.7 C) Resp: 16 BP: 122/75 SpO2: 99 %  BMI: Estimated body mass index is 40.35 kg/m as calculated from the following:   Height as of this encounter: 5\' 6"  (1.676 m).   Weight as of this encounter: 250 lb (113.4 kg).  Risk Assessment: Allergies: Reviewed. She is allergic to codeine; hydromorphone; latex; oxycodone-acetaminophen; sulfa antibiotics; bupropion; gabapentin; hydrocodone; morphine and related; and procaine.  Allergy Precautions: None required Coagulopathies: Reviewed. None identified.  Blood-thinner therapy: None at this time Active Infection(s): Reviewed. None identified. [name] is afebrile  Site Confirmation: [name] was asked to confirm the procedure and laterality before marking the site Procedure checklist:  Completed Consent: Before the procedure and under the influence of no sedative(s), amnesic(s), or anxiolytics, the patient was informed of the treatment options, risks and possible complications. To fulfill our ethical and legal obligations, as recommended by the American Medical Association's Code of Ethics, I have informed the patient of my clinical impression; the nature and purpose  of the treatment or procedure; the risks, benefits, and possible complications of the intervention; the alternatives, including doing nothing; the risk(s) and benefit(s) of the alternative treatment(s) or procedure(s); and the risk(s) and benefit(s) of doing nothing. The patient was provided information about the general risks and possible complications associated with the procedure. These may include, but are not limited to: failure to achieve desired goals, infection, bleeding, organ or nerve damage, allergic reactions, paralysis, and death. In addition, the patient was informed of those risks and complications associated to Spine-related procedures, such as failure to decrease pain; infection (i.e.: Meningitis, epidural or intraspinal abscess); bleeding (i.e.: epidural hematoma, subarachnoid hemorrhage, or any other type of intraspinal or peri-dural bleeding); organ or nerve damage (i.e.: Any type of peripheral nerve, nerve root, or spinal cord injury) with subsequent damage to sensory, motor, and/or autonomic systems, resulting in permanent pain, numbness, and/or weakness of one or several areas of the body; allergic reactions; (i.e.: anaphylactic reaction); and/or death. Furthermore, the patient was informed of those risks and complications associated with the medications. These include, but are not limited to: allergic reactions (i.e.: anaphylactic or anaphylactoid reaction(s)); adrenal axis suppression; blood sugar elevation that in diabetics may result in ketoacidosis or comma; water retention that in patients with history  of congestive heart failure may result in shortness of breath, pulmonary edema, and decompensation with resultant heart failure; weight gain; swelling or edema; medication-induced neural toxicity; particulate matter embolism and blood vessel occlusion with resultant organ, and/or nervous system infarction; and/or aseptic necrosis of one or more joints. Finally, the patient was informed that Medicine is not an exact science; therefore, there is also the possibility of unforeseen or unpredictable risks and/or possible complications that may result in a catastrophic outcome. The patient indicated having understood very clearly. We have given the patient no guarantees and we have made no promises. Enough time was given to the patient to ask questions, all of which were answered to the patient's satisfaction. [name] has indicated that she wanted to continue with the procedure. Attestation: I, the ordering provider, attest that I have discussed with the patient the benefits, risks, side-effects, alternatives, likelihood of achieving goals, and potential problems during recovery for the procedure that I have provided informed consent. Date  Time: 12/14/2018  8:12 AM  Pre-Procedure Preparation:  Monitoring: As per clinic protocol. Respiration, ETCO2, SpO2, BP, heart rate and rhythm monitor placed and checked for adequate function Safety Precautions: Patient was assessed for positional comfort and pressure points before starting the procedure. Time-out: I initiated and conducted the "Time-out" before starting the procedure, as per protocol. The patient was asked to participate by confirming the accuracy of the "Time Out" information. Verification of the correct person, site, and procedure were performed and confirmed by me, the nursing staff, and the patient. "Time-out" conducted as per Joint Commission's Universal Protocol (UP.01.01.01). Time: 0838  Description of Procedure:       Position:  Prone Laterality: Left Levels:   L3, L4, L5,  Medial Branch Level(s), at the L4-5, and the L5-S1 lumbar facet joints. Area Prepped: Lumbosacral Prepping solution: ChloraPrep (2% chlorhexidine gluconate and 70% isopropyl alcohol) Safety Precautions: Aspiration looking for blood return was conducted prior to all injections. At no point did we inject any substances, as a needle was being advanced. Before injecting, the patient was told to immediately notify me if she was experiencing any new onset of "ringing in the ears, or metallic taste in the mouth". No attempts were made at  seeking any paresthesias. Safe injection practices and needle disposal techniques used. Medications properly checked for expiration dates. SDV (single dose vial) medications used. After the completion of the procedure, all disposable equipment used was discarded in the proper designated medical waste containers. Local Anesthesia: Protocol guidelines were followed. The patient was positioned over the fluoroscopy table. The area was prepped in the usual manner. The time-out was completed. The target area was identified using fluoroscopy. A 12-in long, straight, sterile hemostat was used with fluoroscopic guidance to locate the targets for each level blocked. Once located, the skin was marked with an approved surgical skin marker. Once all sites were marked, the skin (epidermis, dermis, and hypodermis), as well as deeper tissues (fat, connective tissue and muscle) were infiltrated with a small amount of a short-acting local anesthetic, loaded on a 10cc syringe with a 25G, 1.5-in  Needle. An appropriate amount of time was allowed for local anesthetics to take effect before proceeding to the next step. Local Anesthetic: Lidocaine 1.0% The unused portion of the local anesthetic was discarded in the proper designated containers. Technical explanation of process:  Radiofrequency Ablation (RFA)  L3 Medial Branch Nerve RFA: The target area for  the L3 medial branch is at the junction of the postero-lateral aspect of the superior articular process and the superior, posterior, and medial edge of the transverse process of L4. Under fluoroscopic guidance, a Radiofrequency needle was inserted until contact was made with os over the superior postero-lateral aspect of the pedicular shadow (target area). Sensory and motor testing was conducted to properly adjust the position of the needle. Once satisfactory placement of the needle was achieved, the numbing solution was slowly injected after negative aspiration for blood. 1 mL of the nerve block solution was injected without difficulty or complication. After waiting for at least 3 minutes, the ablation was performed. Once completed, the needle was removed intact. L4 Medial Branch Nerve RFA: The target area for the L4 medial branch is at the junction of the postero-lateral aspect of the superior articular process and the superior, posterior, and medial edge of the transverse process of L5. Under fluoroscopic guidance, a Radiofrequency needle was inserted until contact was made with os over the superior postero-lateral aspect of the pedicular shadow (target area). Sensory and motor testing was conducted to properly adjust the position of the needle. Once satisfactory placement of the needle was achieved, the numbing solution was slowly injected after negative aspiration for blood. 1mL of the nerve block solution was injected without difficulty or complication. After waiting for at least 3 minutes, the ablation was performed. Once completed, the needle was removed intact. L5 Medial Branch Nerve RFA: The target area for the L5 medial branch is at the junction of the postero-lateral aspect of the superior articular process of S1 and the superior, posterior, and medial edge of the sacral ala. Under fluoroscopic guidance, a Radiofrequency needle was inserted until contact was made with os over the superior  postero-lateral aspect of the pedicular shadow (target area). Sensory and motor testing was conducted to properly adjust the position of the needle. Once satisfactory placement of the needle was achieved, the numbing solution was slowly injected after negative aspiration for blood. 1mL of the nerve block solution was injected without difficulty or complication. After waiting for at least 3 minutes, the ablation was performed. Once completed, the needle was removed intact.  Radiofrequency lesioning (ablation):  Radiofrequency Generator: NeuroTherm [id number] Sensory Stimulation Parameters: 50 Hz was used to locate & identify the  nerve, making sure that the needle was positioned such that there was no sensory stimulation below 0.3 V or above 0.7 V. Motor Stimulation Parameters: 2 Hz was used to evaluate the motor component. Care was taken not to lesion any nerves that demonstrated motor stimulation of the lower extremities at an output of less than 2.5 times that of the sensory threshold, or a maximum of 2.0 V. Lesioning Technique Parameters: Standard Radiofrequency settings. (Not bipolar or pulsed.) Temperature Settings: 80 degrees C Lesioning time: 60 seconds Intra-operative Compliance: Compliant Materials & Medications: Needle(s) (Electrode/Cannula) Type: Teflon-coated, curved tip, Radiofrequency needle(s) Gauge: 22G Length: 10cm Numbing solution: 5 cc solution of 4 cc of 0.2% Ropivacaine and 1 cc of Decadron 10mg /cc, 1.5 cc injected at each level above prior to ablation. The unused portion of the solution was discarded in the proper designated containers.  Once the entire procedure was completed, the treated area was cleaned, making sure to leave some of the prepping solution back to take advantage of its long term bactericidal properties.  Illustration of the posterior view of the lumbar spine and the posterior neural structures. Laminae of L2 through S1 are labeled. DPRL5, dorsal primary ramus of  L5; DPRS1, dorsal primary ramus of S1; DPR3, dorsal primary ramus of L3; FJ, facet (zygapophyseal) joint L3-L4; I, inferior articular process of L4; LB1, lateral branch of dorsal primary ramus of L1; IAB, inferior articular branches from L3 medial branch (supplies L4-L5 facet joint); IBP, intermediate branch plexus; MB3, medial branch of dorsal primary ramus of L3; NR3, third lumbar nerve root; S, superior articular process of L5; SAB, superior articular branches from L4 (supplies L4-5 facet joint also); TP3, transverse process of L3.  Vitals:   12/14/18 0900 12/14/18 0909 12/14/18 0920 12/14/18 0930  BP:  137/70 (!) 160/76 (!) 148/59  Pulse:      Resp: 14 11 11 12   Temp:  98 F (36.7 C)  98.2 F (36.8 C)  SpO2: 97% 100% 99% 100%  Weight:      Height:        Start Time: 0838 hrs. End Time: 0900 hrs.  Imaging Guidance (Spinal):  Type of Imaging Technique: Fluoroscopy Guidance (Spinal) Indication(s): Assistance in needle guidance and placement for procedures requiring needle placement in or near specific anatomical locations not easily accessible without such assistance. Exposure Time: Please see nurses notes. Contrast: None used. Fluoroscopic Guidance: I was personally present during the use of fluoroscopy. "Tunnel Vision Technique" used to obtain the best possible view of the target area. Parallax error corrected before commencing the procedure. "Direction-depth-direction" technique used to introduce the needle under continuous pulsed fluoroscopy. Once target was reached, antero-posterior, oblique, and lateral fluoroscopic projection used confirm needle placement in all planes. Images permanently stored in EMR. Interpretation: No contrast injected. I personally interpreted the imaging intraoperatively. Adequate needle placement confirmed in multiple planes. Permanent images saved into the patient's record.  Antibiotic Prophylaxis:   Anti-infectives (From admission, onward)   None      Indication(s): None identified  Post-operative Assessment:  Post-procedure Vital Signs:  Pulse/HCG Rate: 7171 Temp: 98.2 F (36.8 C) Resp: 12 BP: (!) 148/59 SpO2: 100 %  EBL: None  Complications: No immediate post-treatment complications observed by team, or reported by patient.  Note: The patient tolerated the entire procedure well. A repeat set of vitals were taken after the procedure and the patient was kept under observation following institutional policy, for this type of procedure. Post-procedural neurological assessment was performed, showing return to baseline,  prior to discharge. The patient was provided with post-procedure discharge instructions, including a section on how to identify potential problems. Should any problems arise concerning this procedure, the patient was given instructions to immediately contact us, at any time, without hesitation. In any case, we plan to contact the patient by telephone for a follow-up status report regarding this interventional procedure.  Comments:  No additional relevant information. 5 out of 5 strength bilateral lower extremity: Plantar flexion, dorsiflexion, knee flexion, knee extension.  Plan of Care    Imaging Orders     DG PAIN CLINIC C-ARM 1-60 MIN NO REPORT  Procedure Orders     Radiofrequency,Lumbar Follow up for RIGHT- L3,4,5 RFA  Medications ordered for procedure: Meds ordered this encounter  Medications  . lidocaine (XYLOCAINE) 2 % (with pres) injection 400 mg  . fentaNYL (SUBLIMAZE) injection 25-50 mcg    Make sure Narcan is available in the pyxis when using this medication. In the event of respiratory depression (RR< 8/min): Titrate NARCAN (naloxone) in increments of 0.1 to 0.2 mg IV at 2-3 minute intervals, until desired degree of reversal.  . ropivacaine (PF) 2 mg/mL (0.2%) (NAROPIN) injection 1 mL  . dexamethasone (DECADRON) injection 10 mg   Medications administered: We administered lidocaine, fentaNYL,  ropivacaine (PF) 2 mg/mL (0.2%), and dexamethasone.  See the medical record for exact dosing, route, and time of administration.  New Prescriptions   No medications on file   Disposition: Discharge home  Discharge Date & Time: 12/14/2018; 1033 hrs.   Physician-requested Follow-up: Return in about 2 weeks (around 12/28/2018) for Contra-lateral RFA: R L3,4,5 RFA.  Future Appointments  Date Time Provider [place]  12/28/2018  8:00 AM Gillis Santa, MD [facility] None  02/09/2019  9:30 AM Gillis Santa, MD [facility] None   Primary Care Physician: Kirk Ruths, MD Location: [facility] Note by: Gillis Santa, MD Date: 12/14/2018; Time: 9:49 AM  Disclaimer:  Medicine is not an exact science. The only guarantee in medicine is that nothing is guaranteed. It is important to note that the decision to proceed with this intervention was based on the information collected from the patient. The Data and conclusions were drawn from the patient's questionnaire, the interview, and the physical examination. Because the information was provided in large part by the patient, it cannot be guaranteed that it has not been purposely or unconsciously manipulated. Every effort has been made to obtain as much relevant data as possible for this evaluation. It is important to note that the conclusions that lead to this procedure are derived in large part from the available data. Always take into account that the treatment will also be dependent on availability of resources and existing treatment guidelines, considered by other Pain Management Practitioners as being common knowledge and practice, at the time of the intervention. For Medico-Legal purposes, it is also important to point out that variation in procedural techniques and pharmacological choices are the acceptable norm. The indications, contraindications, technique, and results of the above procedure should only be interpreted  and judged by a Board-Certified Interventional Pain Specialist with extensive familiarity and expertise in the same exact procedure and technique.

## 2018-12-14 NOTE — Progress Notes (Signed)
Safety precautions to be maintained throughout the outpatient stay will include: orient to surroundings, keep bed in low position, maintain call bell within reach at all times, provide assistance with transfer out of bed and ambulation.  

## 2018-12-15 ENCOUNTER — Telehealth: Payer: Self-pay

## 2018-12-15 NOTE — Telephone Encounter (Signed)
Patient was called and message

## 2018-12-15 NOTE — Telephone Encounter (Signed)
Continue last note: Patient was called and message left on answering service

## 2018-12-23 ENCOUNTER — Other Ambulatory Visit
Admission: RE | Admit: 2018-12-23 | Discharge: 2018-12-23 | Disposition: A | Discharge status: Home / Self Care | Payer: Medicare Other | Source: Ambulatory Visit | Attending: Student in an Organized Health Care Education/Training Program | Admitting: Student in an Organized Health Care Education/Training Program

## 2018-12-23 ENCOUNTER — Other Ambulatory Visit: Payer: Self-pay

## 2018-12-23 DIAGNOSIS — Z1159 Encounter for screening for other viral diseases: Secondary | ICD-10-CM | POA: Diagnosis not present

## 2018-12-23 DIAGNOSIS — Z01812 Encounter for preprocedural laboratory examination: Secondary | ICD-10-CM | POA: Insufficient documentation

## 2018-12-24 LAB — SARS CORONAVIRUS 2 (TAT 6-24 HRS): SARS Coronavirus 2: NEGATIVE

## 2018-12-28 ENCOUNTER — Ambulatory Visit
Admission: RE | Admit: 2018-12-28 | Discharge: 2018-12-28 | Disposition: A | Discharge status: Home / Self Care | Payer: Medicare Other | Source: Ambulatory Visit | Attending: Student in an Organized Health Care Education/Training Program | Admitting: Student in an Organized Health Care Education/Training Program

## 2018-12-28 ENCOUNTER — Other Ambulatory Visit: Payer: Self-pay

## 2018-12-28 ENCOUNTER — Ambulatory Visit (HOSPITAL_BASED_OUTPATIENT_CLINIC_OR_DEPARTMENT_OTHER): Payer: Medicare Other | Admitting: Student in an Organized Health Care Education/Training Program

## 2018-12-28 ENCOUNTER — Encounter: Payer: Self-pay | Admitting: Student in an Organized Health Care Education/Training Program

## 2018-12-28 VITALS — BP 150/85 | HR 72 | Temp 97.4°F | Resp 11 | Ht 66.0 in | Wt 263.0 lb

## 2018-12-28 DIAGNOSIS — M47816 Spondylosis without myelopathy or radiculopathy, lumbar region: Secondary | ICD-10-CM | POA: Diagnosis present

## 2018-12-28 MED ORDER — LIDOCAINE HCL 2 % IJ SOLN
INTRAMUSCULAR | Status: AC
  Filled 2018-12-28: qty 20

## 2018-12-28 MED ORDER — DEXAMETHASONE SODIUM PHOSPHATE 10 MG/ML IJ SOLN
INTRAMUSCULAR | Status: AC
  Filled 2018-12-28: qty 1

## 2018-12-28 MED ORDER — FENTANYL CITRATE (PF) 100 MCG/2ML IJ SOLN
25.0000 ug | INTRAMUSCULAR | Status: DC | PRN
  Administered 2018-12-28: 09:00:00 50 ug via INTRAVENOUS
  Filled 2018-12-28: qty 2

## 2018-12-28 MED ORDER — ROPIVACAINE HCL 2 MG/ML IJ SOLN
1.0000 mL | Freq: Once | INTRAMUSCULAR | Status: AC
  Administered 2018-12-28: 10 mL via EPIDURAL

## 2018-12-28 MED ORDER — DEXAMETHASONE SODIUM PHOSPHATE 10 MG/ML IJ SOLN
10.0000 mg | Freq: Once | INTRAMUSCULAR | Status: AC
  Administered 2018-12-28: 10 mg

## 2018-12-28 MED ORDER — ROPIVACAINE HCL 2 MG/ML IJ SOLN
INTRAMUSCULAR | Status: AC
  Filled 2018-12-28: qty 10

## 2018-12-28 MED ORDER — LIDOCAINE HCL 2 % IJ SOLN
20.0000 mL | Freq: Once | INTRAMUSCULAR | Status: AC
  Administered 2018-12-28: 400 mg

## 2018-12-28 NOTE — Progress Notes (Signed)
Patient's Name: [name]  MRN: [id number]  Referring Provider: Lauro Regulus, MD  DOB: 23-Jan-1946  PCP: Lauro Regulus, MD  DOS: 12/28/2018  Note by: Edward Jolly, MD  Service setting: Ambulatory outpatient  Specialty: Interventional Pain Management  Patient type: Established  Location: [facility] (AMB) Pain Management Facility  Visit type: Interventional Procedure   Primary Reason for Visit: Interventional Pain Management Treatment. CC: Back Pain  Procedure:       Anesthesia, Analgesia, Anxiolysis:  Type: Thermal Lumbar Facet, Medial Branch Radiofrequency Ablation/Neurotomy Level:L3, L4, L5,  Medial Branch Level(s). These levels will denervate the L4-5, and the L5-S1 lumbar facet joints. Primary Purpose: Therapeutic Region: Posterolateral Lumbosacral Spine Laterality: Right  (#1 done 12/22/17 RIGHT L3, L4, L5 RFA)   Type: Moderate (Conscious) Sedation combined with Local Anesthesia Indication(s): Analgesia and Anxiety Route: Intravenous (IV) IV Access: Secured Sedation: Meaningful verbal contact was maintained at all times during the procedure  Local Anesthetic: Lidocaine 1%   Indications: 1. Lumbar spondylosis   2. Lumbar facet arthropathy    [name] has been dealing with the above chronic pain for longer than three months and has either failed to respond, was unable to tolerate, or simply did not get enough benefit from other more conservative therapies including, but not limited to: 1. Over-the-counter medications 2. Anti-inflammatory medications 3. Muscle relaxants 4. Membrane stabilizers 5. Opioids 6. Physical therapy 7. Modalities (Heat, ice, etc.) 8. Invasive techniques such as nerve blocks. [name] has attained more than 50% relief of the pain from a series of diagnostic injections conducted in separate occasions.  Pain Score: Pre-procedure: 5 /10 Post-procedure: 0-No pain/10  Pre-op Assessment:  [name] is a 73 y.o. (year old), female patient, seen today for interventional treatment. She  has a past surgical history that includes Ankle reconstruction (Left); Brachioplasty; Cataract extraction; Cholecystectomy; Soft Tissue Tumor Resection; Abdominal hysterectomy (07/12/2014); Joint replacement (Bilateral, 07/2014, 03/2015); nasal reconstrucion; Toe Surgery (Right); Shoulder open rotator cuff repair; Colonoscopy with propofol (N/A, 12/07/2016); and Reduction mammaplasty (Bilateral). [name] has a current medication list which includes the following prescription(s): acetaminophen, amantadine, amlodipine, vitamin c, aspirin ec, azelastine, b complex vitamins, baclofen, carvedilol, celecoxib, vitamin d3, donepezil, esomeprazole, folic acid, levothyroxine, losartan, melatonin, montelukast, multivitamin, onabotulinumtoxina, potassium chloride sa, prednisone, tapentadol, tapentadol, tizanidine, torsemide, tramadol, trazodone, turmeric, and venlafaxine, and the following Facility-Administered Medications: fentanyl. Her primarily concern today is the Back Pain  Initial Vital Signs:  Pulse/HCG Rate: 72ECG Heart Rate: 68 Temp: 98.3 F (36.8 C) Resp: 10 BP: (!) 162/79 SpO2: 97 %  BMI: Estimated body mass index is 42.45 kg/m as calculated from the following:   Height as of this encounter:  (1.676 m).   Weight as of this encounter: 263 lb (119.3 kg).  Risk Assessment: Allergies: Reviewed. She is allergic to codeine; hydromorphone; latex; oxycodone-acetaminophen; sulfa antibiotics; bupropion; gabapentin; hydrocodone; morphine and related; and procaine.  Allergy Precautions: None required Coagulopathies: Reviewed. None identified.  Blood-thinner therapy: None at this time Active Infection(s): Reviewed. None identified. [name] is afebrile  Site Confirmation: [name] was asked to confirm the procedure and laterality before marking  the site Procedure checklist: Completed Consent: Before the procedure and under the influence of no sedative(s), amnesic(s), or anxiolytics, the patient was informed of the treatment options, risks and possible complications. To fulfill our ethical and legal obligations, as recommended by the American Medical Association's Code of Ethics, I have informed the patient of my  clinical impression; the nature and purpose of the treatment or procedure; the risks, benefits, and possible complications of the intervention; the alternatives, including doing nothing; the risk(s) and benefit(s) of the alternative treatment(s) or procedure(s); and the risk(s) and benefit(s) of doing nothing. The patient was provided information about the general risks and possible complications associated with the procedure. These may include, but are not limited to: failure to achieve desired goals, infection, bleeding, organ or nerve damage, allergic reactions, paralysis, and death. In addition, the patient was informed of those risks and complications associated to Spine-related procedures, such as failure to decrease pain; infection (i.e.: Meningitis, epidural or intraspinal abscess); bleeding (i.e.: epidural hematoma, subarachnoid hemorrhage, or any other type of intraspinal or peri-dural bleeding); organ or nerve damage (i.e.: Any type of peripheral nerve, nerve root, or spinal cord injury) with subsequent damage to sensory, motor, and/or autonomic systems, resulting in permanent pain, numbness, and/or weakness of one or several areas of the body; allergic reactions; (i.e.: anaphylactic reaction); and/or death. Furthermore, the patient was informed of those risks and complications associated with the medications. These include, but are not limited to: allergic reactions (i.e.: anaphylactic or anaphylactoid reaction(s)); adrenal axis suppression; blood sugar elevation that in diabetics may result in ketoacidosis or comma; water  retention that in patients with history of congestive heart failure may result in shortness of breath, pulmonary edema, and decompensation with resultant heart failure; weight gain; swelling or edema; medication-induced neural toxicity; particulate matter embolism and blood vessel occlusion with resultant organ, and/or nervous system infarction; and/or aseptic necrosis of one or more joints. Finally, the patient was informed that Medicine is not an exact science; therefore, there is also the possibility of unforeseen or unpredictable risks and/or possible complications that may result in a catastrophic outcome. The patient indicated having understood very clearly. We have given the patient no guarantees and we have made no promises. Enough time was given to the patient to ask questions, all of which were answered to the patient's satisfaction. Ms. Robin SagoHollifield [name] has indicated that she wanted to continue with the procedure. Attestation: I, the ordering provider, attest that I have discussed with the patient the benefits, risks, side-effects, alternatives, likelihood of achieving goals, and potential problems during recovery for the procedure that I have provided informed consent. Date  Time: 12/28/2018  8:09 AM  Pre-Procedure Preparation:  Monitoring: As per clinic protocol. Respiration, ETCO2, SpO2, BP, heart rate and rhythm monitor placed and checked for adequate function Safety Precautions: Patient was assessed for positional comfort and pressure points before starting the procedure. Time-out: I initiated and conducted the "Time-out" before starting the procedure, as per protocol. The patient was asked to participate by confirming the accuracy of the "Time Out" information. Verification of the correct person, site, and procedure were performed and confirmed by me, the nursing staff, and the patient. "Time-out" conducted as per Joint Commission's Universal Protocol (UP.01.01.01). Time: 580840   Description of Procedure:       Position: Prone Laterality: Right Levels:   L3, L4, L5,  Medial Branch Level(s), at the L4-5, and the L5-S1 lumbar facet joints. Area Prepped: Lumbosacral Prepping solution: ChloraPrep (2% chlorhexidine gluconate and 70% isopropyl alcohol) Safety Precautions: Aspiration looking for blood return was conducted prior to all injections. At no point did we inject any substances, as a needle was being advanced. Before injecting, the patient was told to immediately notify me if she was experiencing any new onset of "ringing in the ears, or metallic taste in the  mouth". No attempts were made at seeking any paresthesias. Safe injection practices and needle disposal techniques used. Medications properly checked for expiration dates. SDV (single dose vial) medications used. After the completion of the procedure, all disposable equipment used was discarded in the proper designated medical waste containers. Local Anesthesia: Protocol guidelines were followed. The patient was positioned over the fluoroscopy table. The area was prepped in the usual manner. The time-out was completed. The target area was identified using fluoroscopy. A 12-in long, straight, sterile hemostat was used with fluoroscopic guidance to locate the targets for each level blocked. Once located, the skin was marked with an approved surgical skin marker. Once all sites were marked, the skin (epidermis, dermis, and hypodermis), as well as deeper tissues (fat, connective tissue and muscle) were infiltrated with a small amount of a short-acting local anesthetic, loaded on a 10cc syringe with a 25G, 1.5-in  Needle. An appropriate amount of time was allowed for local anesthetics to take effect before proceeding to the next step. Local Anesthetic: Lidocaine 1.0% The unused portion of the local anesthetic was discarded in the proper designated containers. Technical explanation of process:  Radiofrequency Ablation (RFA)  L3  Medial Branch Nerve RFA: The target area for the L3 medial branch is at the junction of the postero-lateral aspect of the superior articular process and the superior, posterior, and medial edge of the transverse process of L4. Under fluoroscopic guidance, a Radiofrequency needle was inserted until contact was made with os over the superior postero-lateral aspect of the pedicular shadow (target area). Sensory and motor testing was conducted to properly adjust the position of the needle. Once satisfactory placement of the needle was achieved, the numbing solution was slowly injected after negative aspiration for blood. 1 mL of the nerve block solution was injected without difficulty or complication. After waiting for at least 3 minutes, the ablation was performed. Once completed, the needle was removed intact. L4 Medial Branch Nerve RFA: The target area for the L4 medial branch is at the junction of the postero-lateral aspect of the superior articular process and the superior, posterior, and medial edge of the transverse process of L5. Under fluoroscopic guidance, a Radiofrequency needle was inserted until contact was made with os over the superior postero-lateral aspect of the pedicular shadow (target area). Sensory and motor testing was conducted to properly adjust the position of the needle. Once satisfactory placement of the needle was achieved, the numbing solution was slowly injected after negative aspiration for blood. 57mL of the nerve block solution was injected without difficulty or complication. After waiting for at least 3 minutes, the ablation was performed. Once completed, the needle was removed intact. L5 Medial Branch Nerve RFA: The target area for the L5 medial branch is at the junction of the postero-lateral aspect of the superior articular process of S1 and the superior, posterior, and medial edge of the sacral ala. Under fluoroscopic guidance, a Radiofrequency needle was inserted until contact was  made with os over the superior postero-lateral aspect of the pedicular shadow (target area). Sensory and motor testing was conducted to properly adjust the position of the needle. Once satisfactory placement of the needle was achieved, the numbing solution was slowly injected after negative aspiration for blood. 25mL of the nerve block solution was injected without difficulty or complication. After waiting for at least 3 minutes, the ablation was performed. Once completed, the needle was removed intact.  Radiofrequency lesioning (ablation):  Radiofrequency Generator: NeuroTherm [id number] Sensory Stimulation Parameters: 50 Hz was  used to locate & identify the nerve, making sure that the needle was positioned such that there was no sensory stimulation below 0.3 V or above 0.7 V. Motor Stimulation Parameters: 2 Hz was used to evaluate the motor component. Care was taken not to lesion any nerves that demonstrated motor stimulation of the lower extremities at an output of less than 2.5 times that of the sensory threshold, or a maximum of 2.0 V. Lesioning Technique Parameters: Standard Radiofrequency settings. (Not bipolar or pulsed.) Temperature Settings: 80 degrees C Lesioning time: 60 seconds Intra-operative Compliance: Compliant Materials & Medications: Needle(s) (Electrode/Cannula) Type: Teflon-coated, curved tip, Radiofrequency needle(s) Gauge: 22G Length: 10cm Numbing solution: 5 cc solution of 4 cc of 0.2% Ropivacaine and 1 cc of Decadron 10mg /cc, 1.5 cc injected at each level above prior to ablation. The unused portion of the solution was discarded in the proper designated containers.  Once the entire procedure was completed, the treated area was cleaned, making sure to leave some of the prepping solution back to take advantage of its long term bactericidal properties.  Illustration of the posterior view of the lumbar spine and the posterior neural structures. Laminae of L2 through S1 are labeled.  DPRL5, dorsal primary ramus of L5; DPRS1, dorsal primary ramus of S1; DPR3, dorsal primary ramus of L3; FJ, facet (zygapophyseal) joint L3-L4; I, inferior articular process of L4; LB1, lateral branch of dorsal primary ramus of L1; IAB, inferior articular branches from L3 medial branch (supplies L4-L5 facet joint); IBP, intermediate branch plexus; MB3, medial branch of dorsal primary ramus of L3; NR3, third lumbar nerve root; S, superior articular process of L5; SAB, superior articular branches from L4 (supplies L4-5 facet joint also); TP3, transverse process of L3.  Vitals:   12/28/18 0903 12/28/18 0910 12/28/18 0920 12/28/18 0929  BP: 112/82 124/76 (!) 146/85 (!) 150/85  Pulse:      Resp: 10 10 12 11   Temp:  (!) 97.2 F (36.2 C)  (!) 97.4 F (36.3 C)  SpO2: 95% 98% 97% 100%  Weight:      Height:        Start Time: 0840 hrs. End Time: 0902 hrs.  Imaging Guidance (Spinal):  Type of Imaging Technique: Fluoroscopy Guidance (Spinal) Indication(s): Assistance in needle guidance and placement for procedures requiring needle placement in or near specific anatomical locations not easily accessible without such assistance. Exposure Time: Please see nurses notes. Contrast: None used. Fluoroscopic Guidance: I was personally present during the use of fluoroscopy. "Tunnel Vision Technique" used to obtain the best possible view of the target area. Parallax error corrected before commencing the procedure. "Direction-depth-direction" technique used to introduce the needle under continuous pulsed fluoroscopy. Once target was reached, antero-posterior, oblique, and lateral fluoroscopic projection used confirm needle placement in all planes. Images permanently stored in EMR. Interpretation: No contrast injected. I personally interpreted the imaging intraoperatively. Adequate needle placement confirmed in multiple planes. Permanent images saved into the patient's record.  Antibiotic Prophylaxis:    Anti-infectives (From admission, onward)   None     Indication(s): None identified  Post-operative Assessment:  Post-procedure Vital Signs:  Pulse/HCG Rate: 7267 Temp: (!) 97.4 F (36.3 C) Resp: 11 BP: (!) 150/85 SpO2: 100 %  EBL: None  Complications: No immediate post-treatment complications observed by team, or reported by patient.  Note: The patient tolerated the entire procedure well. A repeat set of vitals were taken after the procedure and the patient was kept under observation following institutional policy, for this type of procedure.  Post-procedural neurological assessment was performed, showing return to baseline, prior to discharge. The patient was provided with post-procedure discharge instructions, including a section on how to identify potential problems. Should any problems arise concerning this procedure, the patient was given instructions to immediately contact us, at any time, without hesitation. In any case, we plan to contact the patient by telephone for a follow-up status report regarding this interventional procedure.  Comments:  No additional relevant information. 5 out of 5 strength bilateral lower extremity: Plantar flexion, dorsiflexion, knee flexion, knee extension.  Plan of Care    Imaging Orders     DG PAIN CLINIC C-ARM 1-60 MIN NO REPORT Procedure Orders    No procedure(s) ordered today   Medications ordered for procedure: Meds ordered this encounter  Medications  . lidocaine (XYLOCAINE) 2 % (with pres) injection 400 mg  . fentaNYL (SUBLIMAZE) injection 25-50 mcg    Make sure Narcan is available in the pyxis when using this medication. In the event of respiratory depression (RR< 8/min): Titrate NARCAN (naloxone) in increments of 0.1 to 0.2 mg IV at 2-3 minute intervals, until desired degree of reversal.  . ropivacaine (PF) 2 mg/mL (0.2%) (NAROPIN) injection 1 mL  . dexamethasone (DECADRON) injection 10 mg   Medications administered: We  administered lidocaine, fentaNYL, ropivacaine (PF) 2 mg/mL (0.2%), and dexamethasone.  See the medical record for exact dosing, route, and time of administration.  New Prescriptions   No medications on file   Disposition: Discharge home  Discharge Date & Time: 12/28/2018; 0930 hrs.   Physician-requested Follow-up:Keep august appt  Future Appointments  Date Time Provider Department Center  02/09/2019  9:30 AM Edward JollyLateef, Catelyn Friel, MD [facility] None   Primary Care Physician: Lauro RegulusAnderson, Marshall W, MD [facility] Note by: Edward JollyBilal Dakiya Puopolo, MD Date: 12/28/2018; Time: 9:54 AM  Disclaimer:  Medicine is not an exact science. The only guarantee in medicine is that nothing is guaranteed. It is important to note that the decision to proceed with this intervention was based on the information collected from the patient. The Data and conclusions were drawn from the patient's questionnaire, the interview, and the physical examination. Because the information was provided in large part by the patient, it cannot be guaranteed that it has not been purposely or unconsciously manipulated. Every effort has been made to obtain as much relevant data as possible for this evaluation. It is important to note that the conclusions that lead to this procedure are derived in large part from the available data. Always take into account that the treatment will also be dependent on availability of resources and existing treatment guidelines, considered by other Pain Management Practitioners as being common knowledge and practice, at the time of the intervention. For Medico-Legal purposes, it is also important to point out that variation in procedural techniques and pharmacological choices are the acceptable norm. The indications, contraindications, technique, and results of the above procedure should only be interpreted and judged by a Board-Certified Interventional Pain Specialist with extensive  familiarity and expertise in the same exact procedure and technique.

## 2018-12-28 NOTE — Patient Instructions (Signed)

## 2018-12-29 ENCOUNTER — Telehealth: Payer: Self-pay | Admitting: *Deleted

## 2018-12-29 NOTE — Telephone Encounter (Signed)
No problems post procedure. 

## 2019-01-04 DIAGNOSIS — Z9989 Dependence on other enabling machines and devices: Secondary | ICD-10-CM | POA: Insufficient documentation

## 2019-02-09 ENCOUNTER — Encounter: Payer: Medicare Other | Admitting: Student in an Organized Health Care Education/Training Program

## 2019-02-13 ENCOUNTER — Encounter: Payer: Self-pay | Admitting: Student in an Organized Health Care Education/Training Program

## 2019-02-14 ENCOUNTER — Ambulatory Visit
Payer: Medicare Other | Attending: Student in an Organized Health Care Education/Training Program | Admitting: Student in an Organized Health Care Education/Training Program

## 2019-02-14 ENCOUNTER — Encounter: Payer: Self-pay | Admitting: Student in an Organized Health Care Education/Training Program

## 2019-02-14 ENCOUNTER — Other Ambulatory Visit: Payer: Self-pay

## 2019-02-14 DIAGNOSIS — M47816 Spondylosis without myelopathy or radiculopathy, lumbar region: Secondary | ICD-10-CM | POA: Diagnosis not present

## 2019-02-14 DIAGNOSIS — M5136 Other intervertebral disc degeneration, lumbar region: Secondary | ICD-10-CM | POA: Diagnosis not present

## 2019-02-14 DIAGNOSIS — G894 Chronic pain syndrome: Secondary | ICD-10-CM | POA: Diagnosis not present

## 2019-02-14 DIAGNOSIS — M62830 Muscle spasm of back: Secondary | ICD-10-CM | POA: Diagnosis not present

## 2019-02-14 MED ORDER — TAPENTADOL HCL 50 MG PO TABS: 50.0000 mg | ORAL_TABLET | Freq: Every day | ORAL | 0 refills | Status: AC | PRN

## 2019-02-14 MED ORDER — TAPENTADOL HCL 50 MG PO TABS: 50.0000 mg | ORAL_TABLET | Freq: Every day | ORAL | 0 refills | Status: DC | PRN

## 2019-02-14 NOTE — Progress Notes (Signed)
Pain Management Virtual Encounter Note - Virtual Visit via Video Conference Telehealth (real-time audio visits between healthcare provider and patient).   Patient's Phone No. & Preferred Pharmacy:  [phone] (home); [phone] (mobile); (Preferred) [phone] [email or web address]  [facility][id number] [place] [facility]- [place], [place] - [address] AT [facility] [address] [place] [id number] Phone: [phone] Fax: [phone]    Pre-screening note:  Our staff contacted [name] and offered her an "in person", "face-to-face" appointment versus a telephone encounter. She indicated preferring the telephone encounter, at this time.   Reason for Virtual Visit: COVID-19*  Social distancing based on CDC and AMA recommendations.   I contacted [name] [name] on 02/14/2019 via video conference.      I clearly identified myself as Edward JollyBilal Tonnie Friedel, MD. I verified that I was speaking with the correct person using two identifiers (Name: [name], and date of birth: 06/02/1946).  Advanced Informed Consent I sought verbal advanced consent from Aaron MoseLinda [name] [name] for virtual visit interactions. I informed [name] of possible security and privacy concerns, risks, and limitations associated with providing "not-in-person" medical evaluation and management services. I also informed [name] of the availability of "in-person" appointments. Finally, I informed her that there would be a charge for the virtual visit and that she could be  personally, fully or partially, financially responsible for it. [name] expressed understanding and agreed to proceed.   Historic Elements   [name] [name] is a 73 y.o. year old, female patient evaluated today after her last encounter by our practice on 12/29/2018. [name]  has a past medical  history of Benign positional vertigo (03/04/2016), Brain injury (HCC), Climacteric, Depression, DJD (degenerative joint disease), Dyslipidemia, GERD (gastroesophageal reflux disease), Hypertension, Hypokalemia, Hypothyroidism, IBS (irritable bowel syndrome), OAB (overactive bladder), Ovarian cyst (03/25/2015), Rhinitis, Sleep apnea, and Spasm of thoracolumbar muscle. She also  has a past surgical history that includes Ankle reconstruction (Left); Brachioplasty; Cataract extraction; Cholecystectomy; Soft Tissue Tumor Resection; Abdominal hysterectomy (07/12/2014); Joint replacement (Bilateral, 07/2014, 03/2015); nasal reconstrucion; Toe Surgery (Right); Shoulder open rotator cuff repair; Colonoscopy with propofol (N/A, 12/07/2016); and Reduction mammaplasty (Bilateral). [name] has a current medication list which includes the following prescription(s): acetaminophen, amantadine, amlodipine, vitamin c, aspirin ec, azelastine, baclofen, carvedilol, celecoxib, vitamin d3, donepezil, esomeprazole, folic acid, levothyroxine, losartan, melatonin, montelukast, multivitamin, onabotulinumtoxina, potassium chloride sa, torsemide, trazodone, venlafaxine, b complex vitamins, prednisone, tapentadol, tapentadol, tapentadol, and turmeric. She  reports that she has never smoked. She has never used smokeless tobacco. She reports current alcohol use of about 2.0 standard drinks of alcohol per week. She reports that she does not use drugs. [name] is allergic to codeine; hydromorphone; latex; oxycodone-acetaminophen; sulfa antibiotics; bupropion; gabapentin; hydrocodone; morphine and related; and procaine.   HPI  Today, she is being contacted for medication management.   No change in medical history since last visit.  Patient's pain is at baseline.  Patient continues multimodal pain regimen as prescribed.  States that it provides pain relief and improvement in functional  status.   Pharmacotherapy Assessment   01/11/2019  2   11/10/2018  Nucynta 50 MG Tablet  30.00 30 Bi Lat   [id number]   [facility] (7587)   0  20.00 MME  Comm Ins   [place]     Monitoring: Pharmacotherapy: No side-effects or adverse reactions reported.  PMP: PDMP reviewed during this encounter.  Compliance: No problems identified. Effectiveness: Clinically acceptable. Plan: Refer to "POC".  UDS:  Summary  Date Value Ref Range Status  05/31/2018 FINAL  Final    Comment:    ==================================================================== TOXASSURE SELECT 13 (MW) ==================================================================== Test                             Result       Flag       Units Drug Present and Declared for Prescription Verification   Tapentadol                     3923         EXPECTED   ng/mg creat    Source of tapentadol is a scheduled prescription medication. Drug Absent but Declared for Prescription Verification   Tramadol                       Not Detected UNEXPECTED ng/mg creat ==================================================================== Test                      Result    Flag   Units      Ref Range   Creatinine              99               mg/dL      >=60>=20 ==================================================================== Declared Medications:  The flagging and interpretation on this report are based on the  following declared medications.  Unexpected results may arise from  inaccuracies in the declared medications.  **Note: The testing scope of this panel includes these medications:  Tapentadol  Tramadol  **Note: The testing scope of this panel does not include following  reported medications:  Acetaminophen  Amantadine  Amlodipine Besylate  Aspirin (Aspirin 81)  Azelastine  B Complex  Baclofen  Benzonatate  Botulinum (Botox)  Carvedilol  Celecoxib  Donepezil  Doxycycline  Esomeprazole  Folic acid  Levothyroxine  Losartan  (Losartan Potassium)  Melatonin  Montelukast  Multivitamin (MVI)  Potassium  Prednisone  Tizanidine  Torsemide  Trazodone  Turmeric  Vitamin C  Vitamin D3 ==================================================================== For clinical consultation, please call [phone]. ====================================================================    Laboratory Chemistry Profile (12 mo)  Renal: No results found for requested labs within last 8760 hours.  No results found for: GFRAA, [facility] Inc[facility]NONAA Hepatic: No results found for requested labs within last 8760 hours. No results found for: AST, ALT Other: No results found for requested labs within last 8760 hours. Note: Above Lab results reviewed.  Imaging  Last 90 days:  Dg Pain Clinic C-arm 1-60 Min No Report  Result Date: 12/28/2018 Fluoro was used, but no Radiologist interpretation will be provided. Please refer to "NOTES" tab for provider progress note.  Dg Pain Clinic C-arm 1-60 Min No Report  Result Date: 12/14/2018 Fluoro was used, but no Radiologist interpretation will be provided. Please refer to "NOTES" tab for provider progress note.  Mm 3d Screen Breast Bilateral  Result Date: 11/28/2018 CLINICAL DATA:  Screening. EXAM: DIGITAL SCREENING BILATERAL MAMMOGRAM WITH TOMO AND CAD COMPARISON:  Previous exam(s). ACR Breast Density Category b: There are scattered areas of fibroglandular density. FINDINGS: There are no findings suspicious for malignancy. Images were processed with CAD. IMPRESSION: No mammographic evidence of malignancy. A result letter of this screening mammogram will be mailed directly to the patient. RECOMMENDATION: Screening mammogram in one year. (Code:SM-B-01Y) BI-RADS CATEGORY  1: Negative. Electronically Signed   By: Dorise Bullion III M.D   On: 11/28/2018 14:12    Assessment  The primary encounter diagnosis was Lumbar spondylosis. Diagnoses of Lumbar facet arthropathy, Lumbar degenerative disc disease, Spasm  of lumbar paraspinous muscle, and Chronic pain syndrome were also pertinent to this visit.  Plan of Care  I have discontinued Tenna Child. [name]'s traMADol. I am also having her start on tapentadol, tapentadol, and tapentadol. Additionally, I am having her maintain her amantadine, amLODipine, aspirin EC, baclofen, carvedilol, celecoxib, donepezil, esomeprazole, folic acid, losartan, Melatonin, montelukast, multivitamin, potassium chloride SA, levothyroxine, traZODone, venlafaxine, torsemide, B Complex Vitamins (VITAMIN-B COMPLEX PO), vitamin C, Vitamin D3, OnabotulinumtoxinA (BOTOX IJ), azelastine, predniSONE, Turmeric, and acetaminophen.  Pharmacotherapy (Medications Ordered): Meds ordered this encounter  Medications  . tapentadol (NUCYNTA) 50 MG tablet    Sig: Take 1 tablet (50 mg total) by mouth daily as needed for moderate pain or severe pain. Do not take if you have epilepsy or a history of seizures. Swallow tablets whole. Do not chew, crush or dissolve.    Dispense:  30 tablet    Refill:  0    [place] STOP ACT - Not applicable. Fill one day early if pharmacy is closed on scheduled refill date.  . tapentadol (NUCYNTA) 50 MG tablet    Sig: Take 1 tablet (50 mg total) by mouth daily as needed for moderate pain or severe pain. Do not take if you have epilepsy or a history of seizures. Swallow tablets whole. Do not chew, crush or dissolve.    Dispense:  30 tablet    Refill:  0    [place] STOP ACT - Not applicable. Fill one day early if pharmacy is closed on scheduled refill date.  . tapentadol (NUCYNTA) 50 MG tablet    Sig: Take 1 tablet (50 mg total) by mouth daily as needed for moderate pain or severe pain. Do not take if you have epilepsy or a history of seizures. Swallow tablets whole. Do not chew, crush or dissolve.    Dispense:  30 tablet    Refill:  0    [place] STOP ACT - Not applicable. Fill one day early if pharmacy is closed on scheduled refill date.   Follow-up plan:   No  follow-ups on file.     Status post left L3, L4, L5 RFA 12/01/2017, 12/14/2018.  Status post right L3, L4, L5 RFA on 12/22/2017, 12/28/2018.  Helps decrease her pain and improve her functional status for axial low back for approximately 6 to 8 months post RFA.  Can repeat every 6 to 12 months.   Recent Visits Date Type Provider Dept  12/28/18 Procedure visit Gillis Santa, MD [facility]  12/14/18 Procedure visit Gillis Santa, MD [facility]  Showing recent visits within past 90 days and meeting all other requirements   Today's Visits Date Type Provider Dept  02/14/19 Office Visit Gillis Santa, MD [facility]  Showing today's visits and meeting all other requirements   Future Appointments No visits were found meeting these conditions.  Showing future appointments within next 90 days and meeting all other requirements   I discussed the assessment and treatment plan with the patient. The patient was provided an opportunity to ask questions and all were answered. The patient agreed with the plan and demonstrated an understanding of the instructions.  Patient advised to call back or seek an in-person evaluation if the symptoms or condition worsens.  Total duration of  non-face-to-face encounter: .  Note by: Edward Jolly, MD Date: 02/14/2019; Time: 9:36 AM  Note: This dictation was prepared with Dragon dictation. Any transcriptional errors that may result from this process are unintentional.  Disclaimer:  * Given the special circumstances of the COVID-19 pandemic, the federal government has announced that the Office for Civil Rights (OCR) will exercise its enforcement discretion and will not impose penalties on physicians using telehealth in the event of noncompliance with regulatory requirements under the [employer] Portability and Accountability Act (HIPAA) in connection with the good faith provision of telehealth during the COVID-19 national public  health emergency. (AMA)

## 2019-03-06 ENCOUNTER — Other Ambulatory Visit: Payer: Self-pay | Admitting: Student

## 2019-03-06 DIAGNOSIS — M19012 Primary osteoarthritis, left shoulder: Secondary | ICD-10-CM

## 2019-03-10 ENCOUNTER — Other Ambulatory Visit: Payer: Self-pay | Admitting: Student

## 2019-03-10 DIAGNOSIS — M19012 Primary osteoarthritis, left shoulder: Secondary | ICD-10-CM

## 2019-04-03 ENCOUNTER — Ambulatory Visit
Admission: RE | Admit: 2019-04-03 | Discharge: 2019-04-03 | Disposition: A | Discharge status: Home / Self Care | Payer: Medicare Other | Source: Ambulatory Visit | Attending: Student | Admitting: Student

## 2019-04-03 DIAGNOSIS — M19012 Primary osteoarthritis, left shoulder: Secondary | ICD-10-CM

## 2019-04-07 ENCOUNTER — Telehealth: Payer: Self-pay | Admitting: *Deleted

## 2019-04-07 NOTE — Telephone Encounter (Signed)
Called and atalked with patient. Instructed that our doctors would not increase her pain meds, that the dentist performing her dental work could and she stated she was alleg to everything. Patient states that she will take tylenol /motrin and use ice packs. Instructed to call us back with any needs.

## 2019-04-26 DIAGNOSIS — Z87828 Personal history of other (healed) physical injury and trauma: Secondary | ICD-10-CM | POA: Insufficient documentation

## 2019-04-26 DIAGNOSIS — F32A Depression, unspecified: Secondary | ICD-10-CM | POA: Insufficient documentation

## 2019-04-26 DIAGNOSIS — H832X9 Labyrinthine dysfunction, unspecified ear: Secondary | ICD-10-CM | POA: Insufficient documentation

## 2019-04-26 DIAGNOSIS — F070 Personality change due to known physiological condition: Secondary | ICD-10-CM | POA: Insufficient documentation

## 2019-04-26 DIAGNOSIS — E079 Disorder of thyroid, unspecified: Secondary | ICD-10-CM | POA: Insufficient documentation

## 2019-04-26 DIAGNOSIS — K219 Gastro-esophageal reflux disease without esophagitis: Secondary | ICD-10-CM | POA: Insufficient documentation

## 2019-05-15 ENCOUNTER — Encounter: Payer: Self-pay | Admitting: Student in an Organized Health Care Education/Training Program

## 2019-05-16 ENCOUNTER — Other Ambulatory Visit: Payer: Self-pay

## 2019-05-16 ENCOUNTER — Ambulatory Visit
Payer: Medicare Other | Attending: Student in an Organized Health Care Education/Training Program | Admitting: Student in an Organized Health Care Education/Training Program

## 2019-05-16 ENCOUNTER — Encounter: Payer: Self-pay | Admitting: Student in an Organized Health Care Education/Training Program

## 2019-05-16 DIAGNOSIS — M62830 Muscle spasm of back: Secondary | ICD-10-CM | POA: Diagnosis not present

## 2019-05-16 DIAGNOSIS — M47816 Spondylosis without myelopathy or radiculopathy, lumbar region: Secondary | ICD-10-CM

## 2019-05-16 DIAGNOSIS — M5136 Other intervertebral disc degeneration, lumbar region: Secondary | ICD-10-CM | POA: Diagnosis not present

## 2019-05-16 DIAGNOSIS — G894 Chronic pain syndrome: Secondary | ICD-10-CM | POA: Diagnosis not present

## 2019-05-16 MED ORDER — TAPENTADOL HCL 50 MG PO TABS: 50.0000 mg | ORAL_TABLET | Freq: Two times a day (BID) | ORAL | 0 refills | Status: DC | PRN

## 2019-05-16 MED ORDER — TAPENTADOL HCL 50 MG PO TABS: 50.0000 mg | ORAL_TABLET | Freq: Two times a day (BID) | ORAL | 0 refills | Status: AC | PRN

## 2019-05-16 NOTE — Progress Notes (Signed)
Pain Management Virtual Encounter Note - Virtual Visit via Video Conference Telehealth (real-time audio visits between healthcare provider and patient).   Patient's Phone No. & Preferred Pharmacy:  [phone] (home); [phone] (mobile); (Preferred) [phone] [email or web address]  [id number] [place], [place] - [address] AT [facility] [address] [place] [address] Phone: [phone] Fax: [phone]    Pre-screening note:  Our staff contacted [name] and offered her an "in person", "face-to-face" appointment versus a telephone encounter. She indicated preferring the telephone encounter, at this time.   Reason for Virtual Visit: COVID-19*  Social distancing based on CDC and AMA recommendations.   I contacted [name] [name] on 05/16/2019 via video conference.      I clearly identified myself as Robin Jolly, MD. I verified that I was speaking with the correct person using two identifiers (Name: [name], and date of birth: 13-Mar-1946).  Advanced Informed Consent I sought verbal advanced consent from [name] for virtual visit interactions. I informed [name] of possible security and privacy concerns, risks, and limitations associated with providing "not-in-person" medical evaluation and management services. I also informed [name] of the availability of "in-person" appointments. Finally, I informed her that there would be a charge for the virtual visit and that she could be  personally, fully or partially, financially responsible for it. [name] expressed understanding and agreed to proceed.   Historic Elements   [name] [name] is a 73 y.o. year old, female patient evaluated today after her last encounter by our practice on 04/07/2019. [name]  has a past medical  history of Benign positional vertigo (03/04/2016), Brain injury (HCC), Climacteric, Depression, DJD (degenerative joint disease), Dyslipidemia, GERD (gastroesophageal reflux disease), Hypertension, Hypokalemia, Hypothyroidism, IBS (irritable bowel syndrome), OAB (overactive bladder), Ovarian cyst (03/25/2015), Rhinitis, Sleep apnea, and Spasm of thoracolumbar muscle. She also  has a past surgical history that includes Ankle reconstruction (Left); Brachioplasty; Cataract extraction; Cholecystectomy; Soft Tissue Tumor Resection; Abdominal hysterectomy (07/12/2014); Joint replacement (Bilateral, 07/2014, 03/2015); nasal reconstrucion; Toe Surgery (Right); Shoulder open rotator cuff repair; Colonoscopy with propofol (N/A, 12/07/2016); and Reduction mammaplasty (Bilateral). [name] has a current medication list which includes the following prescription(s): acetaminophen, amantadine, amlodipine, vitamin c, aspirin ec, azelastine, baclofen, carvedilol, celecoxib, vitamin d3, clonazepam, donepezil, esomeprazole, folic acid, levothyroxine, losartan, melatonin, montelukast, multivitamin, onabotulinumtoxina, potassium chloride sa, tapentadol, tapentadol, tapentadol, torsemide, trazodone, venlafaxine, b complex vitamins, prednisone, and turmeric. She  reports that she has never smoked. She has never used smokeless tobacco. She reports current alcohol use of about 2.0 standard drinks of alcohol per week. She reports that she does not use drugs. [name] is allergic to codeine; hydromorphone; latex; oxycodone-acetaminophen; sulfa antibiotics; bupropion; gabapentin; hydrocodone; morphine and related; and procaine.   HPI  Today, she is being contacted for worsening of previously known (established) problem and MM.  Patient endorses low back pain, left greater than right.  This is similar to the pain that she had prior to her lumbar radiofrequency ablation done in July 2020.  Patient states that  her low back pain is similar to her preablation levels.  She is having difficulty moving.  Furthermore her husband, [name] sustained a bad injury and she has been having to take care of him which has resulted in more stress and more physical exertion for her.  We discussed repeating lumbar radiofrequency ablation as  her previous one provided 75% pain relief for 4 to 5 months.  We will start with the left side first, left L3, L4, L5 RFA followed by right L3, L4, L5 RFA.  In the interim, given that the patient is having increased pain, we will make a temporary increase of her Nucynta from quantity 30 to quantity 45 so that she can take an extra tablet on some days when she is having increased breakthrough pain.  Patient endorsed understanding.   Pharmacotherapy Assessment  Analgesic: 04/15/2019  1   02/14/2019  Nucynta 50 MG Tablet  30.00  30 Bi Lat   1200500   Wal ([id number])   0  20.00 MME  Comm Ins   [place]     Monitoring: Pharmacotherapy: No side-effects or adverse reactions reported. [place] PMP: PDMP reviewed during this encounter.       Compliance: No problems identified. Effectiveness: Clinically acceptable. Plan: Refer to "POC".  UDS:  Summary  Date Value Ref Range Status  05/31/2018 FINAL  Final    Comment:    ==================================================================== TOXASSURE SELECT 13 (MW) ==================================================================== Test                             Result       Flag       Units Drug Present and Declared for Prescription Verification   Tapentadol                     3923         EXPECTED   ng/mg creat    Source of tapentadol is a scheduled prescription medication. Drug Absent but Declared for Prescription Verification   Tramadol                       Not Detected UNEXPECTED ng/mg creat ==================================================================== Test                      Result    Flag   Units      Ref Range   Creatinine               99               mg/dL      >=14>=20 ==================================================================== Declared Medications:  The flagging and interpretation on this report are based on the  following declared medications.  Unexpected results may arise from  inaccuracies in the declared medications.  **Note: The testing scope of this panel includes these medications:  Tapentadol  Tramadol  **Note: The testing scope of this panel does not include following  reported medications:  Acetaminophen  Amantadine  Amlodipine Besylate  Aspirin (Aspirin 81)  Azelastine  B Complex  Baclofen  Benzonatate  Botulinum (Botox)  Carvedilol  Celecoxib  Donepezil  Doxycycline  Esomeprazole  Folic acid  Levothyroxine  Losartan (Losartan Potassium)  Melatonin  Montelukast  Multivitamin (MVI)  Potassium  Prednisone  Tizanidine  Torsemide  Trazodone  Turmeric  Vitamin C  Vitamin D3 ==================================================================== For clinical consultation, please call [phone]. ====================================================================    Laboratory Chemistry Profile (12 mo)  Renal: No results found for requested labs within last 8760 hours.  No results found for: GFR, GFRAA, GFRNONAA Hepatic: No results found for requested labs within last 8760 hours. No results found for: AST, ALT Other: No results found for requested labs within last 8760 hours. Note: Above Lab results reviewed.  Assessment  The primary encounter diagnosis was Lumbar spondylosis. Diagnoses of Lumbar facet arthropathy, Lumbar degenerative disc disease, Spasm of lumbar paraspinous muscle, and Chronic pain syndrome were also pertinent to this visit.  Plan of Care  I have changed [name]'s tapentadol. I am also having her start on tapentadol and tapentadol. Additionally, I am having her maintain her amantadine, amLODipine, aspirin EC, baclofen,  carvedilol, celecoxib, donepezil, esomeprazole, folic acid, losartan, Melatonin, montelukast, multivitamin, potassium chloride SA, levothyroxine, traZODone, venlafaxine, torsemide, B Complex Vitamins (VITAMIN-B COMPLEX PO), vitamin C, Vitamin D3, OnabotulinumtoxinA (BOTOX IJ), azelastine, predniSONE, Turmeric, acetaminophen, and clonazePAM.  1.  Schedule for left L3, L4, L5 radiofrequency ablation followed by right L3, L4, L5 radiofrequency ablation anytime after June 15, 2018. 2.  Refill tapentadol as below.  Temporary increase in monthly quantity to #45/month so that patient can utilize it twice daily as needed on some days when she is having increased pain.  After ablation, will reduce back down to quantity 30/month 3.  UDS which we can obtain at procedure visit for annual medication compliance and monitoring 4.  Continue multimodal analgesics with acetaminophen 500 mg every 6 hours as needed, baclofen 20 mg daily as needed, Celebrex 200 mg as needed.   Pharmacotherapy (Medications Ordered): Meds ordered this encounter  Medications  . tapentadol (NUCYNTA) 50 MG tablet    Sig: Take 1 tablet (50 mg total) by mouth 2 (two) times daily as needed for moderate pain or severe pain. Do not take if you have epilepsy or a history of seizures. Swallow tablets whole. Do not chew, crush or dissolve.    Dispense:  45 tablet    Refill:  0    [place] STOP ACT - Not applicable. Fill one day early if pharmacy is closed on scheduled refill date.  . tapentadol (NUCYNTA) 50 MG tablet    Sig: Take 1 tablet (50 mg total) by mouth 2 (two) times daily as needed for moderate pain or severe pain. Do not take if you have epilepsy or a history of seizures. Swallow tablets whole. Do not chew, crush or dissolve.    Dispense:  45 tablet    Refill:  0    [place] STOP ACT - Not applicable. Fill one day early if pharmacy is closed on scheduled refill date.  . tapentadol (NUCYNTA) 50 MG tablet    Sig: Take 1 tablet (50 mg total) by  mouth 2 (two) times daily as needed for moderate pain or severe pain. Do not take if you have epilepsy or a history of seizures. Swallow tablets whole. Do not chew, crush or dissolve.    Dispense:  45 tablet    Refill:  0    [place] STOP ACT - Not applicable. Fill one day early if pharmacy is closed on scheduled refill date.   Orders:  Orders Placed This Encounter  Procedures  . RFA - Lumbar Facet (Schedule)    Standing Status:   Future    Standing Expiration Date:   11/13/2020    Scheduling Instructions:     Side(s): Left-sided     Level: L3-4, L4-5,  Facets (L3, L4, L5, Medial Branch Nerves)     Sedation: With Sedation     Scheduling Timeframe: After Jan 1    Order Specific Question:   Where will this procedure be performed?    Answer:   [facility]  . RFA - Lumbar Facet (Schedule)    Standing Status:   Future  Standing Expiration Date:   11/13/2020    Scheduling Instructions:     Side(s): RIGHT     Level: L3-4, L4-5,  Facets (L3, L4, L5, Medial Branch Nerves)     Sedation: With Sedation     Scheduling Timeframe: After Jan 15, after left side    Order Specific Question:   Where will this procedure be performed?    Answer:   [facility]  . UDS (Compliance-13) ([facility]) ([facility]) (Established Pt.)    Volume: 30 ml(s). Minimum 3 ml of urine is needed. Document temperature of fresh sample. 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     Status post left L3, L4, L5 RFA 12/01/2017, 12/14/2018.  Status post right L3, L4, L5 RFA on 12/22/2017, 12/28/2018.  Helps decrease her pain and improve her functional status for axial low back for approximately 6 to 8 months post RFA.  Can repeat every 6 to 12 months.    Recent Visits No visits were found meeting these conditions.  Showing recent visits within past 90 days and meeting all  other requirements   Today's Visits Date Type Provider Dept  05/16/19 Office Visit Gillis Santa, MD [facility]  Showing today's visits and meeting all other requirements   Future Appointments No visits were found meeting these conditions.  Showing future appointments within next 90 days and meeting all other requirements   I discussed the assessment and treatment plan with the patient. The patient was provided an opportunity to ask questions and all were answered. The patient agreed with the plan and demonstrated an understanding of the instructions.  Patient advised to call back or seek an in-person evaluation if the symptoms or condition worsens.  Total duration of non-face-to-face encounter: 25 minutes.  Note by: Gillis Santa, MD Date: 05/16/2019; Time: 10:42 AM  Note: This dictation was prepared with Dragon dictation. Any transcriptional errors that may result from this process are unintentional.  Disclaimer:  * Given the special circumstances of the COVID-19 pandemic, the federal government has announced that the Office for Civil Rights (OCR) will exercise its enforcement discretion and will not impose penalties on physicians using telehealth in the event of noncompliance with regulatory requirements under the [place] (HIPAA) in connection with the good faith provision of telehealth during the BTDVV-61 national public health emergency. ([place])

## 2019-05-29 ENCOUNTER — Other Ambulatory Visit: Payer: Self-pay | Admitting: Student in an Organized Health Care Education/Training Program

## 2019-05-29 DIAGNOSIS — M47816 Spondylosis without myelopathy or radiculopathy, lumbar region: Secondary | ICD-10-CM

## 2019-06-21 ENCOUNTER — Other Ambulatory Visit: Payer: Self-pay

## 2019-06-21 ENCOUNTER — Ambulatory Visit
Admission: RE | Admit: 2019-06-21 | Discharge: 2019-06-21 | Disposition: A | Discharge status: Home / Self Care | Payer: Medicare Other | Source: Ambulatory Visit | Attending: Student in an Organized Health Care Education/Training Program | Admitting: Student in an Organized Health Care Education/Training Program

## 2019-06-21 ENCOUNTER — Ambulatory Visit (HOSPITAL_BASED_OUTPATIENT_CLINIC_OR_DEPARTMENT_OTHER): Payer: Medicare Other | Admitting: Student in an Organized Health Care Education/Training Program

## 2019-06-21 ENCOUNTER — Encounter: Payer: Self-pay | Admitting: Student in an Organized Health Care Education/Training Program

## 2019-06-21 VITALS — BP 118/58 | HR 77 | Temp 98.0°F | Resp 15 | Ht 66.0 in | Wt 287.0 lb

## 2019-06-21 DIAGNOSIS — M47816 Spondylosis without myelopathy or radiculopathy, lumbar region: Secondary | ICD-10-CM

## 2019-06-21 MED ORDER — ROPIVACAINE HCL 2 MG/ML IJ SOLN
9.0000 mL | Freq: Once | INTRAMUSCULAR | Status: AC
  Administered 2019-06-21: 9 mL via PERINEURAL

## 2019-06-21 MED ORDER — DEXAMETHASONE SODIUM PHOSPHATE 10 MG/ML IJ SOLN
INTRAMUSCULAR | Status: AC
  Filled 2019-06-21: qty 1

## 2019-06-21 MED ORDER — LIDOCAINE HCL 2 % IJ SOLN
INTRAMUSCULAR | Status: AC
  Filled 2019-06-21: qty 20

## 2019-06-21 MED ORDER — FENTANYL CITRATE (PF) 100 MCG/2ML IJ SOLN
INTRAMUSCULAR | Status: AC
  Filled 2019-06-21: qty 2

## 2019-06-21 MED ORDER — DEXAMETHASONE SODIUM PHOSPHATE 10 MG/ML IJ SOLN
10.0000 mg | Freq: Once | INTRAMUSCULAR | Status: AC
  Administered 2019-06-21: 10 mg

## 2019-06-21 MED ORDER — FENTANYL CITRATE (PF) 100 MCG/2ML IJ SOLN
25.0000 ug | INTRAMUSCULAR | Status: DC | PRN
  Administered 2019-06-21: 09:00:00 50 ug via INTRAVENOUS

## 2019-06-21 MED ORDER — LIDOCAINE HCL 2 % IJ SOLN
20.0000 mL | Freq: Once | INTRAMUSCULAR | Status: AC
  Administered 2019-06-21: 400 mg

## 2019-06-21 MED ORDER — ROPIVACAINE HCL 2 MG/ML IJ SOLN
INTRAMUSCULAR | Status: AC
  Filled 2019-06-21: qty 10

## 2019-06-21 NOTE — Patient Instructions (Signed)

## 2019-06-21 NOTE — Progress Notes (Signed)
Patient's Name: [name]  MRN: [id number]  Referring Provider: Lauro RegulusAnderson, Marshall W, MD  DOB: 03/03/1946  PCP: Lauro RegulusAnderson, Marshall W, MD  DOS: 06/21/2019  Note by: Edward JollyBilal Weylyn Ricciuti, MD  Service setting: Ambulatory outpatient  Specialty: Interventional Pain Management  Patient type: Established  Location: [facility] (AMB) Pain Management Facility  Visit type: Interventional Procedure   Primary Reason for Visit: Interventional Pain Management Treatment. CC: Back Pain (lower)  Procedure:       Anesthesia, Analgesia, Anxiolysis:  Type: Thermal Lumbar Facet, Medial Branch Radiofrequency Ablation/Neurotomy #3 Level:L3, L4, L5,  Medial Branch Level(s). These levels will denervate the L3-4, and the L4-5 lumbar facet joints. Primary Purpose: Therapeutic Region: Posterolateral Lumbosacral Spine Laterality: Left  (#1 done 12/01/2017 left L3, L4, L5 RFA, #2 done 12/14/2018)   Type: Moderate (Conscious) Sedation combined with Local Anesthesia Indication(s): Analgesia and Anxiety Route: Intravenous (IV) IV Access: Secured Sedation: Meaningful verbal contact was maintained at all times during the procedure  Local Anesthetic: Lidocaine 1%   Indications: 1. Lumbar spondylosis   2. Lumbar facet arthropathy    [name] has been dealing with the above chronic pain for longer than three months and has either failed to respond, was unable to tolerate, or simply did not get enough benefit from other more conservative therapies including, but not limited to: 1. Over-the-counter medications 2. Anti-inflammatory medications 3. Muscle relaxants 4. Membrane stabilizers 5. Opioids 6. Physical therapy 7. Modalities (Heat, ice, etc.) 8. Invasive techniques such as nerve blocks. [name] has attained more than 50% relief of the pain from a series of diagnostic injections conducted in separate occasions.  Pain Score: Pre-procedure: 7 /10 Post-procedure: 0-No  pain/10  Pre-op Assessment:  [name] is a 74 y.o. (year old), female patient, seen today for interventional treatment. She  has a past surgical history that includes Ankle reconstruction (Left); Brachioplasty; Cataract extraction; Cholecystectomy; Soft Tissue Tumor Resection; Abdominal hysterectomy (07/12/2014); Joint replacement (Bilateral, 07/2014, 03/2015); nasal reconstrucion; Toe Surgery (Right); Shoulder open rotator cuff repair; Colonoscopy with propofol (N/A, 12/07/2016); and Reduction mammaplasty (Bilateral). [name] has a current medication list which includes the following prescription(s): acetaminophen, amantadine, amlodipine, vitamin c, aspirin ec, azelastine, b complex vitamins, baclofen, carvedilol, celecoxib, vitamin d3, clonazepam, donepezil, esomeprazole, folic acid, levothyroxine, losartan, melatonin, montelukast, multivitamin, onabotulinumtoxina, potassium chloride sa, prednisone, tapentadol, [START ON 07/15/2019] tapentadol, torsemide, trazodone, turmeric, and venlafaxine, and the following Facility-Administered Medications: fentanyl. Her primarily concern today is the Back Pain (lower)  Initial Vital Signs:  Pulse/HCG Rate: 77ECG Heart Rate: 77 Temp: 99 F (37.2 C) Resp: 18 BP: (!) 151/80 SpO2: 98 %  BMI: Estimated body mass index is 46.32 kg/m as calculated from the following:   Height as of this encounter: 5\' 6"  (1.676 m).   Weight as of this encounter: 287 lb (130.2 kg).  Risk Assessment: Allergies: Reviewed. She is allergic to codeine; hydromorphone; latex; oxycodone-acetaminophen; sulfa antibiotics; bupropion; gabapentin; hydrocodone; morphine and related; and procaine.  Allergy Precautions: None required Coagulopathies: Reviewed. None identified.  Blood-thinner therapy: None at this time Active Infection(s): Reviewed. None identified. [name] is afebrile  Site Confirmation: [name] was asked to  confirm the procedure and laterality before marking the site Procedure checklist: Completed Consent: Before the procedure and under the influence of no sedative(s), amnesic(s), or anxiolytics, the patient was informed of the treatment options, risks and possible complications. To fulfill our ethical and legal obligations, as recommended by the American Medical Association's Code of  Ethics, I have informed the patient of my clinical impression; the nature and purpose of the treatment or procedure; the risks, benefits, and possible complications of the intervention; the alternatives, including doing nothing; the risk(s) and benefit(s) of the alternative treatment(s) or procedure(s); and the risk(s) and benefit(s) of doing nothing. The patient was provided information about the general risks and possible complications associated with the procedure. These may include, but are not limited to: failure to achieve desired goals, infection, bleeding, organ or nerve damage, allergic reactions, paralysis, and death. In addition, the patient was informed of those risks and complications associated to Spine-related procedures, such as failure to decrease pain; infection (i.e.: Meningitis, epidural or intraspinal abscess); bleeding (i.e.: epidural hematoma, subarachnoid hemorrhage, or any other type of intraspinal or peri-dural bleeding); organ or nerve damage (i.e.: Any type of peripheral nerve, nerve root, or spinal cord injury) with subsequent damage to sensory, motor, and/or autonomic systems, resulting in permanent pain, numbness, and/or weakness of one or several areas of the body; allergic reactions; (i.e.: anaphylactic reaction); and/or death. Furthermore, the patient was informed of those risks and complications associated with the medications. These include, but are not limited to: allergic reactions (i.e.: anaphylactic or anaphylactoid reaction(s)); adrenal axis suppression; blood sugar elevation that in diabetics  may result in ketoacidosis or comma; water retention that in patients with history of congestive heart failure may result in shortness of breath, pulmonary edema, and decompensation with resultant heart failure; weight gain; swelling or edema; medication-induced neural toxicity; particulate matter embolism and blood vessel occlusion with resultant organ, and/or nervous system infarction; and/or aseptic necrosis of one or more joints. Finally, the patient was informed that Medicine is not an exact science; therefore, there is also the possibility of unforeseen or unpredictable risks and/or possible complications that may result in a catastrophic outcome. The patient indicated having understood very clearly. We have given the patient no guarantees and we have made no promises. Enough time was given to the patient to ask questions, all of which were answered to the patient's satisfaction. [name] has indicated that she wanted to continue with the procedure. Attestation: I, the ordering provider, attest that I have discussed with the patient the benefits, risks, side-effects, alternatives, likelihood of achieving goals, and potential problems during recovery for the procedure that I have provided informed consent. Date  Time: 06/21/2019  8:16 AM  Pre-Procedure Preparation:  Monitoring: As per clinic protocol. Respiration, ETCO2, SpO2, BP, heart rate and rhythm monitor placed and checked for adequate function Safety Precautions: Patient was assessed for positional comfort and pressure points before starting the procedure. Time-out: I initiated and conducted the "Time-out" before starting the procedure, as per protocol. The patient was asked to participate by confirming the accuracy of the "Time Out" information. Verification of the correct person, site, and procedure were performed and confirmed by me, the nursing staff, and the patient. "Time-out" conducted as per Joint Commission's Universal  Protocol (UP.01.01.01). Time: 0853  Description of Procedure:       Position: Prone Laterality: Left Levels:   L3, L4, L5,  Medial Branch Level(s), at the L3-4 and the L4-5 lumbar facet joints. Area Prepped: Lumbosacral Prepping solution: ChloraPrep (2% chlorhexidine gluconate and 70% isopropyl alcohol) Safety Precautions: Aspiration looking for blood return was conducted prior to all injections. At no point did we inject any substances, as a needle was being advanced. Before injecting, the patient was told to immediately notify me if she was experiencing any new onset of "ringing  in the ears, or metallic taste in the mouth". No attempts were made at seeking any paresthesias. Safe injection practices and needle disposal techniques used. Medications properly checked for expiration dates. SDV (single dose vial) medications used. After the completion of the procedure, all disposable equipment used was discarded in the proper designated medical waste containers. Local Anesthesia: Protocol guidelines were followed. The patient was positioned over the fluoroscopy table. The area was prepped in the usual manner. The time-out was completed. The target area was identified using fluoroscopy. A 12-in long, straight, sterile hemostat was used with fluoroscopic guidance to locate the targets for each level blocked. Once located, the skin was marked with an approved surgical skin marker. Once all sites were marked, the skin (epidermis, dermis, and hypodermis), as well as deeper tissues (fat, connective tissue and muscle) were infiltrated with a small amount of a short-acting local anesthetic, loaded on a 10cc syringe with a 25G, 1.5-in  Needle. An appropriate amount of time was allowed for local anesthetics to take effect before proceeding to the next step. Local Anesthetic: Lidocaine 1.0% The unused portion of the local anesthetic was discarded in the proper designated containers. Technical explanation of process:   Radiofrequency Ablation (RFA)  L3 Medial Branch Nerve RFA: The target area for the L3 medial branch is at the junction of the postero-lateral aspect of the superior articular process and the superior, posterior, and medial edge of the transverse process of L4. Under fluoroscopic guidance, a Radiofrequency needle was inserted until contact was made with os over the superior postero-lateral aspect of the pedicular shadow (target area). Sensory and motor testing was conducted to properly adjust the position of the needle. Once satisfactory placement of the needle was achieved, the numbing solution was slowly injected after negative aspiration for blood. 1 mL of the nerve block solution was injected without difficulty or complication. After waiting for at least 3 minutes, the ablation was performed. Once completed, the needle was removed intact. L4 Medial Branch Nerve RFA: The target area for the L4 medial branch is at the junction of the postero-lateral aspect of the superior articular process and the superior, posterior, and medial edge of the transverse process of L5. Under fluoroscopic guidance, a Radiofrequency needle was inserted until contact was made with os over the superior postero-lateral aspect of the pedicular shadow (target area). Sensory and motor testing was conducted to properly adjust the position of the needle. Once satisfactory placement of the needle was achieved, the numbing solution was slowly injected after negative aspiration for blood. 5mL of the nerve block solution was injected without difficulty or complication. After waiting for at least 3 minutes, the ablation was performed. Once completed, the needle was removed intact. L5 Medial Branch Nerve RFA: The target area for the L5 medial branch is at the junction of the postero-lateral aspect of the superior articular process of S1 and the superior, posterior, and medial edge of the sacral ala. Under fluoroscopic guidance, a Radiofrequency  needle was inserted until contact was made with os over the superior postero-lateral aspect of the pedicular shadow (target area). Sensory and motor testing was conducted to properly adjust the position of the needle. Once satisfactory placement of the needle was achieved, the numbing solution was slowly injected after negative aspiration for blood. 46mL of the nerve block solution was injected without difficulty or complication. After waiting for at least 3 minutes, the ablation was performed. Once completed, the needle was removed intact.  Radiofrequency lesioning (ablation):  Radiofrequency Generator:  NeuroTherm [id number] Sensory Stimulation Parameters: 50 Hz was used to locate & identify the nerve, making sure that the needle was positioned such that there was no sensory stimulation below 0.3 V or above 0.7 V. Motor Stimulation Parameters: 2 Hz was used to evaluate the motor component. Care was taken not to lesion any nerves that demonstrated motor stimulation of the lower extremities at an output of less than 2.5 times that of the sensory threshold, or a maximum of 2.0 V. Lesioning Technique Parameters: Standard Radiofrequency settings. (Not bipolar or pulsed.) Temperature Settings: 80 degrees C Lesioning time: 60 seconds Intra-operative Compliance: Compliant Materials & Medications: Needle(s) (Electrode/Cannula) Type: Teflon-coated, curved tip, Radiofrequency needle(s) Gauge: 22G Length: 10cm Numbing solution: 5 cc solution of 4 cc of 0.2% Ropivacaine and 1 cc of Decadron 10mg /cc, 1-1.5 cc injected at each level above prior to ablation. The unused portion of the solution was discarded in the proper designated containers.  Once the entire procedure was completed, the treated area was cleaned, making sure to leave some of the prepping solution back to take advantage of its long term bactericidal properties.  Illustration of the posterior view of the lumbar spine and the posterior neural structures.  Laminae of L2 through S1 are labeled. DPRL5, dorsal primary ramus of L5; DPRS1, dorsal primary ramus of S1; DPR3, dorsal primary ramus of L3; FJ, facet (zygapophyseal) joint L3-L4; I, inferior articular process of L4; LB1, lateral branch of dorsal primary ramus of L1; IAB, inferior articular branches from L3 medial branch (supplies L4-L5 facet joint); IBP, intermediate branch plexus; MB3, medial branch of dorsal primary ramus of L3; NR3, third lumbar nerve root; S, superior articular process of L5; SAB, superior articular branches from L4 (supplies L4-5 facet joint also); TP3, transverse process of L3.  Vitals:   06/21/19 0917 06/21/19 0926 06/21/19 0936 06/21/19 0946  BP: 139/86 (!) 145/73 121/73 (!) 118/58  Pulse:      Resp: 11 17 13 15   Temp:  98.2 F (36.8 C)  98 F (36.7 C)  TempSrc:      SpO2: 100% 100% 100% 100%  Weight:      Height:        Start Time: 0853 hrs. End Time: 0917 hrs.  Imaging Guidance (Spinal):  Type of Imaging Technique: Fluoroscopy Guidance (Spinal) Indication(s): Assistance in needle guidance and placement for procedures requiring needle placement in or near specific anatomical locations not easily accessible without such assistance. Exposure Time: Please see nurses notes. Contrast: None used. Fluoroscopic Guidance: I was personally present during the use of fluoroscopy. "Tunnel Vision Technique" used to obtain the best possible view of the target area. Parallax error corrected before commencing the procedure. "Direction-depth-direction" technique used to introduce the needle under continuous pulsed fluoroscopy. Once target was reached, antero-posterior, oblique, and lateral fluoroscopic projection used confirm needle placement in all planes. Images permanently stored in EMR. Interpretation: No contrast injected. I personally interpreted the imaging intraoperatively. Adequate needle placement confirmed in multiple planes. Permanent images saved into the patient's  record.  Antibiotic Prophylaxis:   Anti-infectives (From admission, onward)   None     Indication(s): None identified  Post-operative Assessment:  Post-procedure Vital Signs:  Pulse/HCG Rate: 7774 Temp: 98 F (36.7 C) Resp: 15 BP: (!) 118/58 SpO2: 100 %  EBL: None  Complications: No immediate post-treatment complications observed by team, or reported by patient.  Note: The patient tolerated the entire procedure well. A repeat set of vitals were taken after the procedure and the patient was  kept under observation following institutional policy, for this type of procedure. Post-procedural neurological assessment was performed, showing return to baseline, prior to discharge. The patient was provided with post-procedure discharge instructions, including a section on how to identify potential problems. Should any problems arise concerning this procedure, the patient was given instructions to immediately contact us, at any time, without hesitation. In any case, we plan to contact the patient by telephone for a follow-up status report regarding this interventional procedure.  Comments:  No additional relevant information. 5 out of 5 strength bilateral lower extremity: Plantar flexion, dorsiflexion, knee flexion, knee extension.  Plan of Care    Imaging Orders     Fluoro (C-Arm) (<60 min) (No Report) Procedure Orders    No procedure(s) ordered today   Follow up for RIGHT- L3,4,5 RFA 07/10/19  Medications ordered for procedure: Meds ordered this encounter  Medications  . lidocaine (XYLOCAINE) 2 % (with pres) injection 400 mg  . fentaNYL (SUBLIMAZE) injection 25-50 mcg    Make sure Narcan is available in the pyxis when using this medication. In the event of respiratory depression (RR< 8/min): Titrate NARCAN (naloxone) in increments of 0.1 to 0.2 mg IV at 2-3 minute intervals, until desired degree of reversal.  . ropivacaine (PF) 2 mg/mL (0.2%) (NAROPIN) injection 9 mL  .  dexamethasone (DECADRON) injection 10 mg   Medications administered: We administered lidocaine, fentaNYL, ropivacaine (PF) 2 mg/mL (0.2%), and dexamethasone.  See the medical record for exact dosing, route, and time of administration.  New Prescriptions   No medications on file   Disposition: Discharge home  Discharge Date & Time: 06/21/2019; 0947 hrs.   Physician-requested Follow-up: Return for Keep sch. appt.  Future Appointments  Date Time Provider Department Center  07/10/2019  8:00 AM Edward Jolly, MD [facility] None  08/15/2019  8:00 AM Edward Jolly, MD [facility] None   Primary Care Physician: Lauro Regulus, MD Location: [facility] Note by: Edward Jolly, MD Date: 06/21/2019; Time: 12:57 PM  Disclaimer:  Medicine is not an exact science. The only guarantee in medicine is that nothing is guaranteed. It is important to note that the decision to proceed with this intervention was based on the information collected from the patient. The Data and conclusions were drawn from the patient's questionnaire, the interview, and the physical examination. Because the information was provided in large part by the patient, it cannot be guaranteed that it has not been purposely or unconsciously manipulated. Every effort has been made to obtain as much relevant data as possible for this evaluation. It is important to note that the conclusions that lead to this procedure are derived in large part from the available data. Always take into account that the treatment will also be dependent on availability of resources and existing treatment guidelines, considered by other Pain Management Practitioners as being common knowledge and practice, at the time of the intervention. For Medico-Legal purposes, it is also important to point out that variation in procedural techniques and pharmacological choices are the acceptable norm. The indications, contraindications, technique, and  results of the above procedure should only be interpreted and judged by a Board-Certified Interventional Pain Specialist with extensive familiarity and expertise in the same exact procedure and technique.

## 2019-06-21 NOTE — Progress Notes (Signed)
Safety precautions to be maintained throughout the outpatient stay will include: orient to surroundings, keep bed in low position, maintain call bell within reach at all times, provide assistance with transfer out of bed and ambulation.  

## 2019-06-22 ENCOUNTER — Telehealth: Payer: Self-pay

## 2019-06-22 NOTE — Telephone Encounter (Signed)
Post procedure phone call.  Patient states she is doing well.  

## 2019-06-26 LAB — TOXASSURE SELECT 13 (MW), URINE

## 2019-07-07 DIAGNOSIS — R7303 Prediabetes: Secondary | ICD-10-CM | POA: Insufficient documentation

## 2019-07-10 ENCOUNTER — Ambulatory Visit
Admission: RE | Admit: 2019-07-10 | Discharge: 2019-07-10 | Disposition: A | Discharge status: Home / Self Care | Payer: Medicare Other | Source: Ambulatory Visit | Attending: Student in an Organized Health Care Education/Training Program | Admitting: Student in an Organized Health Care Education/Training Program

## 2019-07-10 ENCOUNTER — Other Ambulatory Visit: Payer: Self-pay

## 2019-07-10 ENCOUNTER — Ambulatory Visit (HOSPITAL_BASED_OUTPATIENT_CLINIC_OR_DEPARTMENT_OTHER): Payer: Medicare Other | Admitting: Student in an Organized Health Care Education/Training Program

## 2019-07-10 ENCOUNTER — Encounter: Payer: Self-pay | Admitting: Student in an Organized Health Care Education/Training Program

## 2019-07-10 VITALS — BP 122/65 | HR 79 | Temp 97.5°F | Resp 15 | Ht 65.0 in | Wt 287.0 lb

## 2019-07-10 DIAGNOSIS — M47816 Spondylosis without myelopathy or radiculopathy, lumbar region: Secondary | ICD-10-CM

## 2019-07-10 MED ORDER — DEXAMETHASONE SODIUM PHOSPHATE 10 MG/ML IJ SOLN
10.0000 mg | Freq: Once | INTRAMUSCULAR | Status: AC
  Administered 2019-07-10: 10 mg

## 2019-07-10 MED ORDER — DEXAMETHASONE SODIUM PHOSPHATE 10 MG/ML IJ SOLN
INTRAMUSCULAR | Status: AC
  Filled 2019-07-10: qty 1

## 2019-07-10 MED ORDER — FENTANYL CITRATE (PF) 100 MCG/2ML IJ SOLN
25.0000 ug | INTRAMUSCULAR | Status: DC | PRN
  Administered 2019-07-10: 50 ug via INTRAVENOUS

## 2019-07-10 MED ORDER — FENTANYL CITRATE (PF) 100 MCG/2ML IJ SOLN
INTRAMUSCULAR | Status: AC
  Filled 2019-07-10: qty 2

## 2019-07-10 MED ORDER — ROPIVACAINE HCL 2 MG/ML IJ SOLN
9.0000 mL | Freq: Once | INTRAMUSCULAR | Status: AC
  Administered 2019-07-10: 10 mL via PERINEURAL

## 2019-07-10 MED ORDER — LIDOCAINE HCL 2 % IJ SOLN
20.0000 mL | Freq: Once | INTRAMUSCULAR | Status: AC
  Administered 2019-07-10: 400 mg

## 2019-07-10 MED ORDER — LIDOCAINE HCL 2 % IJ SOLN
INTRAMUSCULAR | Status: AC
  Filled 2019-07-10: qty 20

## 2019-07-10 MED ORDER — IOHEXOL 180 MG/ML  SOLN: 10.0000 mL | Freq: Once | INTRAMUSCULAR | Status: DC

## 2019-07-10 MED ORDER — ROPIVACAINE HCL 2 MG/ML IJ SOLN
INTRAMUSCULAR | Status: AC
  Filled 2019-07-10: qty 10

## 2019-07-10 NOTE — Progress Notes (Signed)
Patient's Name: [name]  MRN: [id number]  Referring Provider: Lauro RegulusAnderson, Marshall W, MD  DOB: 08/15/1945  PCP: Lauro RegulusAnderson, Marshall W, MD  DOS: 07/10/2019  Note by: Edward JollyBilal Kendria Halberg, MD  Service setting: Ambulatory outpatient  Specialty: Interventional Pain Management  Patient type: Established  Location: [facility] (AMB) Pain Management Facility  Visit type: Interventional Procedure   Primary Reason for Visit: Interventional Pain Management Treatment. CC: Back Pain (right )  Procedure:       Anesthesia, Analgesia, Anxiolysis:  Type: Thermal Lumbar Facet, Medial Branch Radiofrequency Ablation/Neurotomy #3 Level:L3, L4, L5,  Medial Branch Level(s). These levels will denervate the L3-4, and the L4-5 lumbar facet joints. Primary Purpose: Therapeutic Region: Posterolateral Lumbosacral Spine Laterality: Right  (#1 done: 12/22/17 RIGHT L3, L4, L5 RFA, #2: 12/28/2018,)   Type: Moderate (Conscious) Sedation combined with Local Anesthesia Indication(s): Analgesia and Anxiety Route: Intravenous (IV) IV Access: Secured Sedation: Meaningful verbal contact was maintained at all times during the procedure  Local Anesthetic: Lidocaine 1%   Indications: 1. Lumbar spondylosis    [name] has been dealing with the above chronic pain for longer than three months and has either failed to respond, was unable to tolerate, or simply did not get enough benefit from other more conservative therapies including, but not limited to: 1. Over-the-counter medications 2. Anti-inflammatory medications 3. Muscle relaxants 4. Membrane stabilizers 5. Opioids 6. Physical therapy 7. Modalities (Heat, ice, etc.) 8. Invasive techniques such as nerve blocks. [name] has attained more than 50% relief of the pain from a series of diagnostic injections conducted in separate occasions.  Pain Score: Pre-procedure: 5 /10 Post-procedure: 0-No pain/10  Pre-op Assessment:  Ms.  [name] is a 74 y.o. (year old), female patient, seen today for interventional treatment. She  has a past surgical history that includes Ankle reconstruction (Left); Brachioplasty; Cataract extraction; Cholecystectomy; Soft Tissue Tumor Resection; Abdominal hysterectomy (07/12/2014); Joint replacement (Bilateral, 07/2014, 03/2015); nasal reconstrucion; Toe Surgery (Right); Shoulder open rotator cuff repair; Colonoscopy with propofol (N/A, 12/07/2016); and Reduction mammaplasty (Bilateral). [name] has a current medication list which includes the following prescription(s): acetaminophen, amantadine, amantadine hcl, amlodipine, vitamin c, aspirin ec, azelastine, b complex vitamins, baclofen, carvedilol, celecoxib, vitamin d3, clonazepam, donepezil, esomeprazole, folic acid, levothyroxine, losartan, melatonin, montelukast, multivitamin, onabotulinumtoxina, potassium chloride sa, tapentadol, [START ON 07/15/2019] tapentadol, torsemide, trazodone, venlafaxine, prednisone, and turmeric, and the following Facility-Administered Medications: fentanyl and iohexol. Her primarily concern today is the Back Pain (right )  Initial Vital Signs:  Pulse/HCG Rate: 79ECG Heart Rate: 80 Temp: (!) 97.5 F (36.4 C) Resp: 16 BP: 128/84 SpO2: 98 %  BMI: Estimated body mass index is 47.76 kg/m as calculated from the following:   Height as of this encounter: 5\' 5"  (1.651 m).   Weight as of this encounter: 287 lb (130.2 kg).  Risk Assessment: Allergies: Reviewed. She is allergic to codeine; hydromorphone; latex; oxycodone-acetaminophen; sulfa antibiotics; bupropion; gabapentin; hydrocodone; morphine and related; and procaine.  Allergy Precautions: None required Coagulopathies: Reviewed. None identified.  Blood-thinner therapy: None at this time Active Infection(s): Reviewed. None identified. [name] is afebrile  Site Confirmation: [name] was asked to  confirm the procedure and laterality before marking the site Procedure checklist: Completed Consent: Before the procedure and under the influence of no sedative(s), amnesic(s), or anxiolytics, the patient was informed of the treatment options, risks and possible complications. To fulfill our ethical and legal obligations, as recommended by the American Medical Association's Code of Ethics,  I have informed the patient of my clinical impression; the nature and purpose of the treatment or procedure; the risks, benefits, and possible complications of the intervention; the alternatives, including doing nothing; the risk(s) and benefit(s) of the alternative treatment(s) or procedure(s); and the risk(s) and benefit(s) of doing nothing. The patient was provided information about the general risks and possible complications associated with the procedure. These may include, but are not limited to: failure to achieve desired goals, infection, bleeding, organ or nerve damage, allergic reactions, paralysis, and death. In addition, the patient was informed of those risks and complications associated to Spine-related procedures, such as failure to decrease pain; infection (i.e.: Meningitis, epidural or intraspinal abscess); bleeding (i.e.: epidural hematoma, subarachnoid hemorrhage, or any other type of intraspinal or peri-dural bleeding); organ or nerve damage (i.e.: Any type of peripheral nerve, nerve root, or spinal cord injury) with subsequent damage to sensory, motor, and/or autonomic systems, resulting in permanent pain, numbness, and/or weakness of one or several areas of the body; allergic reactions; (i.e.: anaphylactic reaction); and/or death. Furthermore, the patient was informed of those risks and complications associated with the medications. These include, but are not limited to: allergic reactions (i.e.: anaphylactic or anaphylactoid reaction(s)); adrenal axis suppression; blood sugar elevation that in diabetics  may result in ketoacidosis or comma; water retention that in patients with history of congestive heart failure may result in shortness of breath, pulmonary edema, and decompensation with resultant heart failure; weight gain; swelling or edema; medication-induced neural toxicity; particulate matter embolism and blood vessel occlusion with resultant organ, and/or nervous system infarction; and/or aseptic necrosis of one or more joints. Finally, the patient was informed that Medicine is not an exact science; therefore, there is also the possibility of unforeseen or unpredictable risks and/or possible complications that may result in a catastrophic outcome. The patient indicated having understood very clearly. We have given the patient no guarantees and we have made no promises. Enough time was given to the patient to ask questions, all of which were answered to the patient's satisfaction. [name] has indicated that she wanted to continue with the procedure. Attestation: I, the ordering provider, attest that I have discussed with the patient the benefits, risks, side-effects, alternatives, likelihood of achieving goals, and potential problems during recovery for the procedure that I have provided informed consent. Date  Time: 07/10/2019  8:16 AM  Pre-Procedure Preparation:  Monitoring: As per clinic protocol. Respiration, ETCO2, SpO2, BP, heart rate and rhythm monitor placed and checked for adequate function Safety Precautions: Patient was assessed for positional comfort and pressure points before starting the procedure. Time-out: I initiated and conducted the "Time-out" before starting the procedure, as per protocol. The patient was asked to participate by confirming the accuracy of the "Time Out" information. Verification of the correct person, site, and procedure were performed and confirmed by me, the nursing staff, and the patient. "Time-out" conducted as per Joint Commission's Universal  Protocol (UP.01.01.01). Time: 0858  Description of Procedure:       Position: Prone Laterality: Right Levels:   L3, L4, L5,  Medial Branch Level(s), at the L3-4 and the L4-5 lumbar facet joints. Area Prepped: Lumbosacral Prepping solution: ChloraPrep (2% chlorhexidine gluconate and 70% isopropyl alcohol) Safety Precautions: Aspiration looking for blood return was conducted prior to all injections. At no point did we inject any substances, as a needle was being advanced. Before injecting, the patient was told to immediately notify me if she was experiencing any new onset of "ringing in  the ears, or metallic taste in the mouth". No attempts were made at seeking any paresthesias. Safe injection practices and needle disposal techniques used. Medications properly checked for expiration dates. SDV (single dose vial) medications used. After the completion of the procedure, all disposable equipment used was discarded in the proper designated medical waste containers. Local Anesthesia: Protocol guidelines were followed. The patient was positioned over the fluoroscopy table. The area was prepped in the usual manner. The time-out was completed. The target area was identified using fluoroscopy. A 12-in long, straight, sterile hemostat was used with fluoroscopic guidance to locate the targets for each level blocked. Once located, the skin was marked with an approved surgical skin marker. Once all sites were marked, the skin (epidermis, dermis, and hypodermis), as well as deeper tissues (fat, connective tissue and muscle) were infiltrated with a small amount of a short-acting local anesthetic, loaded on a 10cc syringe with a 25G, 1.5-in  Needle. An appropriate amount of time was allowed for local anesthetics to take effect before proceeding to the next step. Local Anesthetic: Lidocaine 1.0% The unused portion of the local anesthetic was discarded in the proper designated containers. Technical explanation of process:   Radiofrequency Ablation (RFA)  L3 Medial Branch Nerve RFA: The target area for the L3 medial branch is at the junction of the postero-lateral aspect of the superior articular process and the superior, posterior, and medial edge of the transverse process of L4. Under fluoroscopic guidance, a Radiofrequency needle was inserted until contact was made with os over the superior postero-lateral aspect of the pedicular shadow (target area). Sensory and motor testing was conducted to properly adjust the position of the needle. Once satisfactory placement of the needle was achieved, the numbing solution was slowly injected after negative aspiration for blood. 1 mL of the nerve block solution was injected without difficulty or complication. After waiting for at least 3 minutes, the ablation was performed. Once completed, the needle was removed intact. L4 Medial Branch Nerve RFA: The target area for the L4 medial branch is at the junction of the postero-lateral aspect of the superior articular process and the superior, posterior, and medial edge of the transverse process of L5. Under fluoroscopic guidance, a Radiofrequency needle was inserted until contact was made with os over the superior postero-lateral aspect of the pedicular shadow (target area). Sensory and motor testing was conducted to properly adjust the position of the needle. Once satisfactory placement of the needle was achieved, the numbing solution was slowly injected after negative aspiration for blood. 78mL of the nerve block solution was injected without difficulty or complication. After waiting for at least 3 minutes, the ablation was performed. Once completed, the needle was removed intact. L5 Medial Branch Nerve RFA: The target area for the L5 medial branch is at the junction of the postero-lateral aspect of the superior articular process of S1 and the superior, posterior, and medial edge of the sacral ala. Under fluoroscopic guidance, a Radiofrequency  needle was inserted until contact was made with os over the superior postero-lateral aspect of the pedicular shadow (target area). Sensory and motor testing was conducted to properly adjust the position of the needle. Once satisfactory placement of the needle was achieved, the numbing solution was slowly injected after negative aspiration for blood. 67mL of the nerve block solution was injected without difficulty or complication. After waiting for at least 3 minutes, the ablation was performed. Once completed, the needle was removed intact.  Radiofrequency lesioning (ablation):  Radiofrequency Generator: NeuroTherm  [id number] Sensory Stimulation Parameters: 50 Hz was used to locate & identify the nerve, making sure that the needle was positioned such that there was no sensory stimulation below 0.3 V or above 0.7 V. Motor Stimulation Parameters: 2 Hz was used to evaluate the motor component. Care was taken not to lesion any nerves that demonstrated motor stimulation of the lower extremities at an output of less than 2.5 times that of the sensory threshold, or a maximum of 2.0 V. Lesioning Technique Parameters: Standard Radiofrequency settings. (Not bipolar or pulsed.) Temperature Settings: 80 degrees C Lesioning time: 60 seconds Intra-operative Compliance: Compliant Materials & Medications: Needle(s) (Electrode/Cannula) Type: Teflon-coated, curved tip, Radiofrequency needle(s) Gauge: 22G Length: 10cm Numbing solution: 5 cc solution of 4 cc of 0.2% Ropivacaine and 1 cc of Decadron 10mg /cc, 1-1.5 cc injected at each level above prior to ablation. The unused portion of the solution was discarded in the proper designated containers.  Once the entire procedure was completed, the treated area was cleaned, making sure to leave some of the prepping solution back to take advantage of its long term bactericidal properties.  Illustration of the posterior view of the lumbar spine and the posterior neural structures.  Laminae of L2 through S1 are labeled. DPRL5, dorsal primary ramus of L5; DPRS1, dorsal primary ramus of S1; DPR3, dorsal primary ramus of L3; FJ, facet (zygapophyseal) joint L3-L4; I, inferior articular process of L4; LB1, lateral branch of dorsal primary ramus of L1; IAB, inferior articular branches from L3 medial branch (supplies L4-L5 facet joint); IBP, intermediate branch plexus; MB3, medial branch of dorsal primary ramus of L3; NR3, third lumbar nerve root; S, superior articular process of L5; SAB, superior articular branches from L4 (supplies L4-5 facet joint also); TP3, transverse process of L3.  Vitals:   07/10/19 0916 07/10/19 0924 07/10/19 0934 07/10/19 0944  BP: 109/75 133/67 (!) 159/83 122/65  Pulse:      Resp: 18 14 14 15   Temp:      TempSrc:      SpO2: 91% 98% 99% 97%  Weight:      Height:        Start Time: 0858 hrs. End Time: 0915 hrs.  Imaging Guidance (Spinal):  Type of Imaging Technique: Fluoroscopy Guidance (Spinal) Indication(s): Assistance in needle guidance and placement for procedures requiring needle placement in or near specific anatomical locations not easily accessible without such assistance. Exposure Time: Please see nurses notes. Contrast: None used. Fluoroscopic Guidance: I was personally present during the use of fluoroscopy. "Tunnel Vision Technique" used to obtain the best possible view of the target area. Parallax error corrected before commencing the procedure. "Direction-depth-direction" technique used to introduce the needle under continuous pulsed fluoroscopy. Once target was reached, antero-posterior, oblique, and lateral fluoroscopic projection used confirm needle placement in all planes. Images permanently stored in EMR. Interpretation: No contrast injected. I personally interpreted the imaging intraoperatively. Adequate needle placement confirmed in multiple planes. Permanent images saved into the patient's record.  Antibiotic Prophylaxis:    Anti-infectives (From admission, onward)   None     Indication(s): None identified  Post-operative Assessment:  Post-procedure Vital Signs:  Pulse/HCG Rate: 7970 Temp: (!) 97.5 F (36.4 C) Resp: 15 BP: 122/65 SpO2: 97 %  EBL: None  Complications: No immediate post-treatment complications observed by team, or reported by patient.  Note: The patient tolerated the entire procedure well. A repeat set of vitals were taken after the procedure and the patient was kept under observation following institutional policy, for this  type of procedure. Post-procedural neurological assessment was performed, showing return to baseline, prior to discharge. The patient was provided with post-procedure discharge instructions, including a section on how to identify potential problems. Should any problems arise concerning this procedure, the patient was given instructions to immediately contact us, at any time, without hesitation. In any case, we plan to contact the patient by telephone for a follow-up status report regarding this interventional procedure.  Comments:  No additional relevant information. 5 out of 5 strength bilateral lower extremity: Plantar flexion, dorsiflexion, knee flexion, knee extension.  Plan of Care    Imaging Orders     DG PAIN CLINIC C-ARM 1-60 MIN NO REPORT Procedure Orders    No procedure(s) ordered today    Medications ordered for procedure: Meds ordered this encounter  Medications  . iohexol (OMNIPAQUE) 180 MG/ML injection 10 mL    Must be Myelogram-compatible. If not available, you may substitute with a water-soluble, non-ionic, hypoallergenic, myelogram-compatible radiological contrast medium.  Marland Kitchen lidocaine (XYLOCAINE) 2 % (with pres) injection 400 mg  . fentaNYL (SUBLIMAZE) injection 25-50 mcg    Make sure Narcan is available in the pyxis when using this medication. In the event of respiratory depression (RR< 8/min): Titrate NARCAN (naloxone) in increments of  0.1 to 0.2 mg IV at 2-3 minute intervals, until desired degree of reversal.  . ropivacaine (PF) 2 mg/mL (0.2%) (NAROPIN) injection 9 mL  . dexamethasone (DECADRON) injection 10 mg   Medications administered: We administered lidocaine, fentaNYL, ropivacaine (PF) 2 mg/mL (0.2%), and dexamethasone.  See the medical record for exact dosing, route, and time of administration.  New Prescriptions   No medications on file   Disposition: Discharge home  Discharge Date & Time: 07/10/2019; 0945 hrs.   Physician-requested Follow-up: Return for Keep sch. appt.  Future Appointments  Date Time Provider Department Center  08/15/2019  8:00 AM Edward Jolly, MD [facility] None   Primary Care Physician: Lauro Regulus, MD Location: [facility] Management [facility] Note by: Edward Jolly, MD Date: 07/10/2019; Time: 10:43 AM  Disclaimer:  Medicine is not an exact science. The only guarantee in medicine is that nothing is guaranteed. It is important to note that the decision to proceed with this intervention was based on the information collected from the patient. The Data and conclusions were drawn from the patient's questionnaire, the interview, and the physical examination. Because the information was provided in large part by the patient, it cannot be guaranteed that it has not been purposely or unconsciously manipulated. Every effort has been made to obtain as much relevant data as possible for this evaluation. It is important to note that the conclusions that lead to this procedure are derived in large part from the available data. Always take into account that the treatment will also be dependent on availability of resources and existing treatment guidelines, considered by other Pain Management Practitioners as being common knowledge and practice, at the time of the intervention. For Medico-Legal purposes, it is also important to point out that variation in procedural techniques and  pharmacological choices are the acceptable norm. The indications, contraindications, technique, and results of the above procedure should only be interpreted and judged by a Board-Certified Interventional Pain Specialist with extensive familiarity and expertise in the same exact procedure and technique.

## 2019-07-10 NOTE — Patient Instructions (Signed)

## 2019-07-10 NOTE — Progress Notes (Signed)
Safety precautions to be maintained throughout the outpatient stay will include: orient to surroundings, keep bed in low position, maintain call bell within reach at all times, provide assistance with transfer out of bed and ambulation.  

## 2019-07-11 ENCOUNTER — Telehealth: Payer: Self-pay | Admitting: *Deleted

## 2019-07-11 NOTE — Telephone Encounter (Signed)
Attempted to call for post procedure follow-up. Message left. 

## 2019-07-29 DIAGNOSIS — R609 Edema, unspecified: Secondary | ICD-10-CM | POA: Insufficient documentation

## 2019-08-05 ENCOUNTER — Ambulatory Visit: Payer: Medicare Other | Attending: Internal Medicine

## 2019-08-05 ENCOUNTER — Other Ambulatory Visit: Payer: Self-pay

## 2019-08-05 DIAGNOSIS — Z23 Encounter for immunization: Secondary | ICD-10-CM

## 2019-08-05 NOTE — Progress Notes (Signed)
   Covid-19 Vaccination Clinic  Name:  ATLEIGH GRUEN [name]    MRN: [id number] DOB: 08/06/1945  08/05/2019  [name] was observed post Covid-19 immunization for 15 minutes without incidence. She was provided with Vaccine Information Sheet and instruction to access the V-Safe system.   Ms. Maralyn Sago [name] was instructed to call 911 with any severe reactions post vaccine: Marland Kitchen Difficulty breathing  . Swelling of your face and throat  . A fast heartbeat  . A bad rash all over your body  . Dizziness and weakness    Immunizations Administered    Name Date Dose VIS Date Route   Pfizer COVID-19 Vaccine 08/05/2019 10:07 AM 0.3 mL 05/26/2019 Intramuscular   Manufacturer: [employer], [employer]   Lot: [id number]   NDC: [address]-9

## 2019-08-14 ENCOUNTER — Encounter: Payer: Self-pay | Admitting: Student in an Organized Health Care Education/Training Program

## 2019-08-15 ENCOUNTER — Ambulatory Visit
Payer: Medicare Other | Attending: Student in an Organized Health Care Education/Training Program | Admitting: Student in an Organized Health Care Education/Training Program

## 2019-08-15 ENCOUNTER — Other Ambulatory Visit: Payer: Self-pay

## 2019-08-15 ENCOUNTER — Encounter: Payer: Self-pay | Admitting: Student in an Organized Health Care Education/Training Program

## 2019-08-15 DIAGNOSIS — M5136 Other intervertebral disc degeneration, lumbar region: Secondary | ICD-10-CM | POA: Diagnosis not present

## 2019-08-15 DIAGNOSIS — G894 Chronic pain syndrome: Secondary | ICD-10-CM | POA: Diagnosis not present

## 2019-08-15 DIAGNOSIS — M47816 Spondylosis without myelopathy or radiculopathy, lumbar region: Secondary | ICD-10-CM | POA: Diagnosis not present

## 2019-08-15 MED ORDER — TAPENTADOL HCL 50 MG PO TABS: 50.0000 mg | ORAL_TABLET | Freq: Two times a day (BID) | ORAL | 0 refills | Status: AC | PRN

## 2019-08-15 MED ORDER — TAPENTADOL HCL 50 MG PO TABS: 50.0000 mg | ORAL_TABLET | Freq: Two times a day (BID) | ORAL | 0 refills | Status: DC | PRN

## 2019-08-15 NOTE — Progress Notes (Signed)
Patient: [name] [facility] [name]  Service Category: E/M  Provider: Gillis Santa, MD  DOB: 03-Aug-1945  DOS: 08/15/2019  Location: Office  MRN: [id number]  Setting: Ambulatory outpatient  Referring Provider: Kirk Ruths, MD  Type: Established Patient  Specialty: Interventional Pain Management  PCP: Kirk Ruths, MD  Location: Home  Delivery: TeleHealth     Virtual Encounter - Pain Management PROVIDER NOTE: Information contained herein reflects review and annotations entered in association with encounter. Interpretation of such information and data should be left to medically-trained personnel. Information provided to patient can be located elsewhere in the medical record under "Patient Instructions". Document created using STT-dictation technology, any transcriptional errors that may result from process are unintentional.    Contact & Pharmacy Preferred: [phone] Home: [phone] (home) Mobile: [phone] (mobile) E-mail: [email or web address] .[id number] [address], [address] [place] [address] Phone: [phone] Fax: [phone]   Pre-screening  [name] offered "in-person" vs "virtual" encounter. She indicated preferring virtual for this encounter.   Reason COVID-19*  Social distancing based on CDC and AMA recommendations.   I contacted Jillienne Egner Hollifield [name] on 08/15/2019 via telephone.      I clearly identified myself as Gillis Santa, MD. I verified that I was speaking with the correct person using two identifiers (Name: Aliyyah Riese [facility] [name], and date of birth: 02-Oct-1945).  This visit was completed via telephone due to the restrictions of the COVID-19 pandemic. All issues as above were discussed and addressed but no physical exam was performed. If it was felt that the patient should be evaluated in the office, they were directed there. The  patient verbally consented to this visit. Patient was unable to complete an audio/visual visit due to Technical difficulties and/or Lack of internet. Due to the catastrophic nature of the COVID-19 pandemic, this visit was done through audio contact only.  Location of the patient: home address (see Epic for details)  Location of the provider: office  Consent I sought verbal advanced consent from [place] for virtual visit interactions. I informed [name] of possible security and privacy concerns, risks, and limitations associated with providing "not-in-person" medical evaluation and management services. I also informed [name] of the availability of "in-person" appointments. Finally, I informed her that there would be a charge for the virtual visit and that she could be  personally, fully or partially, financially responsible for it. [name] expressed understanding and agreed to proceed.   Historic Elements   [name] is a 74 y.o. year old, female patient evaluated today after her last contact with our practice on 07/11/2019. 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 She also  has a past surgical history that includes Ankle reconstruction (Left); Brachioplasty; Cataract extraction; Cholecystectomy; Soft Tissue Tumor Resection; Abdominal hysterectomy (07/12/2014); Joint replacement (Bilateral, 07/2014, 03/2015); nasal reconstrucion; Toe Surgery (Right); Shoulder open rotator cuff repair; Colonoscopy with propofol (N/A, 12/07/2016); and Reduction  mammaplasty (Bilateral). [name] has a current medication list which includes the following prescription(s): acetaminophen, amantadine hcl, amlodipine, vitamin c, aspirin ec, azelastine, b complex vitamins, baclofen, carvedilol, celecoxib, vitamin  d3, clonazepam, donepezil, esomeprazole, folic acid, furosemide, levothyroxine, losartan, melatonin, montelukast, multivitamin, onabotulinumtoxina, potassium chloride sa, spironolactone, [START ON 09/12/2019] tapentadol, [START ON 10/12/2019] tapentadol, [START ON 11/11/2019] tapentadol, topiramate, torsemide, trazodone, turmeric, venlafaxine, amantadine, and prednisone. She  reports that she has never smoked. She has never used smokeless tobacco. She reports current alcohol use of about 2.0 standard drinks of alcohol per week. She reports that she does not use drugs. [name] is allergic to codeine; hydromorphone; latex; oxycodone-acetaminophen; sulfa antibiotics; bupropion; gabapentin; hydrocodone; morphine and related; and procaine.   HPI  Today, she is being contacted for medication management.   Since last visit, patient saw [facility] for left glenohumeral joint injection on 08/02/19 along with subacromial shoulder injection. She states that this was helpful.  Patient is status post right L3, L4, L5 RFA on 07/10/2019 with her left side being done on 06/21/2019.  She states that the facet ablation has helped reduce her pain but she does have some numbness in her anterolateral thigh.  This is not painful.  It is not impacting her ability to walk.  We will continue to monitor the symptom.  Otherwise she continues her Nucynta as prescribed.  She states that it does help her manage her pain.  We will refill as below.  Pharmacotherapy Assessment  Analgesic: 06/22/2019  1   05/16/2019  Nucynta 50 MG Tablet  45.00  22 Bi Lat   [id number]   [facility] (7587)   0  40.91 MME  Medicare   [place]     Monitoring: [place] PMP: PDMP reviewed  during this encounter.       Pharmacotherapy: No side-effects or adverse reactions reported. Compliance: No problems identified. Effectiveness: Clinically acceptable. Plan: Refer to "POC".  UDS:  Summary  Date Value Ref Range Status  06/22/2019 Note  Final    Comment:    ==================================================================== [employer] Select 13 (MW) ==================================================================== Test                             Result       Flag       Units Drug Present and Declared for Prescription Verification   Tapentadol                     4009         EXPECTED   ng/mg creat    Source of tapentadol is a scheduled prescription medication. Drug Absent but Declared for Prescription Verification   Clonazepam                     Not Detected UNEXPECTED ng/mg creat ==================================================================== Test                      Result    Flag   Units      Ref Range   Creatinine              81               mg/dL      >=20 ==================================================================== Declared Medications:  The flagging and interpretation on this report are based on the  following declared medications.  Unexpected results may arise from  inaccuracies in the declared medications.  **Note: The testing scope of this panel includes these medications:  Clonazepam  Tapentadol  **Note: The testing scope of this panel does not include the  following reported medications:  Acetaminophen  Amantadine  Amlodipine  Aspirin  Azelastine  Baclofen  Botulinum (Botox)  Carvedilol  Celecoxib  Cholecalciferol  Donepezil  Esomeprazole  Folic Acid  Levothyroxine  Losartan  Melatonin  Montelukast  Multivitamin  Potassium (Klor-Con)  Prednisone  Torsemide  Trazodone  Turmeric  Venlafaxine  Vitamin B  Vitamin C ==================================================================== For clinical consultation,  please call [phone]. ====================================================================    Laboratory Chemistry Profile   Renal No results found for: BUN, CREATININE, LABCREA, BCR, GFR, GFRAA, GFRNONAA, LABVMA, EPIRU, EPINEPH24HUR, NOREPRU, NOREPI24HUR, DOPARU, [id number]  Hepatic No results found for: AST, ALT, ALBUMIN, ALKPHOS, HCVAB, AMYLASE, LIPASE, AMMONIA  Electrolytes No results found for: NA, K, CL, CALCIUM, MG, PHOS  Bone No results found for: VD25OH, BP112TK2OEC, XF0722VJ5, YN1833PO2, 25OHVITD1, 25OHVITD2, 25OHVITD3, TESTOFREE, TESTOSTERONE  Inflammation (CRP: Acute Phase) (ESR: Chronic Phase) No results found for: CRP, ESRSEDRATE, LATICACIDVEN    Note: Above Lab results reviewed.  Imaging  DG PAIN CLINIC C-ARM 1-60 MIN NO REPORT Fluoro was used, but no Radiologist interpretation will be provided.  Please refer to "NOTES" tab for provider progress note.  Assessment  The primary encounter diagnosis was Lumbar facet arthropathy. Diagnoses of Chronic pain syndrome, Lumbar spondylosis, and Lumbar degenerative disc disease were also pertinent to this visit.  Plan of Care  Ms. Lenea J Hollifield [name] has a current medication list which includes the following long-term medication(s): amantadine hcl, amlodipine, azelastine, carvedilol, clonazepam, donepezil, esomeprazole, levothyroxine, losartan, montelukast, potassium chloride sa, spironolactone, topiramate, torsemide, trazodone, venlafaxine, and amantadine.  Pharmacotherapy (Medications Ordered): Meds ordered this encounter  Medications  . tapentadol (NUCYNTA) 50 MG tablet    Sig: Take 1 tablet (50 mg total) by mouth 2 (two) times daily as needed for moderate pain or severe pain. Do not take if you have epilepsy or a history of seizures. Swallow tablets whole. Do not chew, crush or dissolve.    Dispense:  45 tablet    Refill:  0    [place] STOP ACT - Not applicable. Fill one day early if pharmacy is closed on  scheduled refill date.  . tapentadol (NUCYNTA) 50 MG tablet    Sig: Take 1 tablet (50 mg total) by mouth 2 (two) times daily as needed for moderate pain or severe pain. Do not take if you have epilepsy or a history of seizures. Swallow tablets whole. Do not chew, crush or dissolve.    Dispense:  45 tablet    Refill:  0    [place] STOP ACT - Not applicable. Fill one day early if pharmacy is closed on scheduled refill date.  . tapentadol (NUCYNTA) 50 MG tablet    Sig: Take 1 tablet (50 mg total) by mouth 2 (two) times daily as needed for moderate pain or severe pain. Do not take if you have epilepsy or a history of seizures. Swallow tablets whole. Do not chew, crush or dissolve.    Dispense:  45 tablet    Refill:  0    [place] STOP ACT - Not applicable. Fill one day early if pharmacy is closed on scheduled refill date.    Follow-up plan:   Return in about 3 months (around 11/15/2019) for Medication Management.     Status post left L3, L4, L5 RFA 12/01/2017, 12/14/2018, 06/21/2019.  Status post right L3, L4, L5 RFA on 12/22/2017, 12/28/2018, 07/10/2019 helps decrease her pain and improve her functional status for axial low back for approximately 6 to 8 months post RFA.  Can repeat every 6 to 12 months.  Recent Visits Date Type Provider Dept  07/10/19 Procedure visit Gillis Santa, MD [facility]  06/21/19 Procedure visit Gillis Santa, MD [facility]  Showing recent visits within past 90 days and meeting all other requirements   Today's Visits Date Type Provider Dept  08/15/19 Office Visit Gillis Santa, MD [facility]  Showing today's visits and meeting all other requirements   Future Appointments No visits were found meeting these conditions.  Showing future appointments within next 90 days and meeting all other requirements   I discussed the assessment and treatment plan with the patient. The patient was provided an opportunity to ask questions and all were answered. The  patient agreed with the plan and demonstrated an understanding of the instructions.  Patient advised to call back or seek an in-person evaluation if the symptoms or condition worsens.  Duration of encounter: 25 minutes.  Note by: Gillis Santa, MD Date: 08/15/2019; Time: 8:30 AM

## 2019-08-22 DIAGNOSIS — I5032 Chronic diastolic (congestive) heart failure: Secondary | ICD-10-CM | POA: Insufficient documentation

## 2019-08-29 ENCOUNTER — Ambulatory Visit: Payer: Medicare Other | Attending: Internal Medicine

## 2019-08-29 DIAGNOSIS — Z23 Encounter for immunization: Secondary | ICD-10-CM

## 2019-08-29 NOTE — Progress Notes (Signed)
   Covid-19 Vaccination Clinic  Name:  [name]    MRN: [id number] DOB: September 21, 1945  08/29/2019  [name] was observed post Covid-19 immunization for 15 minutes without incident. She was provided with Vaccine Information Sheet and instruction to access the V-Safe system.   [name] was instructed to call 911 with any severe reactions post vaccine: Marland Kitchen Difficulty breathing  . Swelling of face and throat  . A fast heartbeat  . A bad rash all over body  . Dizziness and weakness   Immunizations Administered    Name Date Dose VIS Date Route   Pfizer COVID-19 Vaccine 08/29/2019  8:44 AM 0.3 mL 05/26/2019 Intramuscular   Manufacturer: [employer], [employer]   Lot: [id number]   NDC: [address]-8

## 2019-09-06 ENCOUNTER — Other Ambulatory Visit: Payer: Self-pay | Admitting: Student

## 2019-09-06 DIAGNOSIS — G8929 Other chronic pain: Secondary | ICD-10-CM

## 2019-09-06 DIAGNOSIS — M5441 Lumbago with sciatica, right side: Secondary | ICD-10-CM

## 2019-11-09 ENCOUNTER — Other Ambulatory Visit: Payer: Self-pay | Admitting: Internal Medicine

## 2019-11-09 DIAGNOSIS — Z1231 Encounter for screening mammogram for malignant neoplasm of breast: Secondary | ICD-10-CM

## 2019-11-16 ENCOUNTER — Ambulatory Visit
Payer: Medicare Other | Attending: Student in an Organized Health Care Education/Training Program | Admitting: Student in an Organized Health Care Education/Training Program

## 2019-11-16 ENCOUNTER — Other Ambulatory Visit: Payer: Self-pay

## 2019-11-16 ENCOUNTER — Encounter: Payer: Self-pay | Admitting: Student in an Organized Health Care Education/Training Program

## 2019-11-16 VITALS — BP 145/75 | HR 78 | Temp 97.9°F | Resp 18 | Ht 66.0 in | Wt 287.0 lb

## 2019-11-16 DIAGNOSIS — M47816 Spondylosis without myelopathy or radiculopathy, lumbar region: Secondary | ICD-10-CM | POA: Insufficient documentation

## 2019-11-16 DIAGNOSIS — M5136 Other intervertebral disc degeneration, lumbar region: Secondary | ICD-10-CM | POA: Insufficient documentation

## 2019-11-16 DIAGNOSIS — G894 Chronic pain syndrome: Secondary | ICD-10-CM | POA: Insufficient documentation

## 2019-11-16 MED ORDER — TAPENTADOL HCL 50 MG PO TABS: 50.0000 mg | ORAL_TABLET | Freq: Two times a day (BID) | ORAL | 0 refills | Status: DC | PRN

## 2019-11-16 NOTE — Progress Notes (Signed)
Nursing Pain Medication Assessment:  Safety precautions to be maintained throughout the outpatient stay will include: orient to surroundings, keep bed in low position, maintain call bell within reach at all times, provide assistance with transfer out of bed and ambulation.  Medication Inspection Compliance: Pill count conducted under aseptic conditions, in front of the patient. Neither the pills nor the bottle was removed from the patient's sight at any time. Once count was completed pills were immediately returned to the patient in their original bottle.  Medication: Tapentadol (Nucynta) Pill/Patch Count: 41 of 45 pills remain Pill/Patch Appearance: Markings consistent with prescribed medication Bottle Appearance: Standard pharmacy container. Clearly labeled. Filled Date: 05 / 30 / 2021 Last Medication intake:  Today

## 2019-11-16 NOTE — Progress Notes (Signed)
PROVIDER NOTE: Information contained herein reflects review and annotations entered in association with encounter. Interpretation of such information and data should be left to medically-trained personnel. Information provided to patient can be located elsewhere in the medical record under "Patient Instructions". Document created using STT-dictation technology, any transcriptional errors that may result from process are unintentional.    Patient: [name]  Service Category: E/M  Provider: Gillis Santa, MD  DOB: [birth year]  DOS: 11/16/2019  Referring Provider: Kirk Ruths, MD  MRN: [id number]  Setting: Ambulatory outpatient  PCP: Kirk Ruths, MD  Type: Established Patient  Specialty: Interventional Pain Management    Location: Office  Delivery: Face-to-face     Primary Reason(s) for Visit: Encounter for prescription drug management. (Level of risk: moderate)  CC: Back Pain (left)  HPI  [name] is a 74 y.o. year old, female patient, who comes today for a medication management evaluation. She has Lumbar spondylosis; Lumbar facet arthropathy; Lumbar degenerative disc disease; Spasm of lumbar paraspinous muscle; and Chronic pain syndrome on their problem list. Her primarily concern today is the Back Pain (left)  Pain Assessment: Location: Lower, Left Back Radiating:   Onset: More than a month ago Duration: Chronic pain Quality: Constant, Pressure, Aching Severity: 3 /10 (subjective, self-reported pain score)  Note: Reported level is compatible with observation.                         When using our objective Pain Scale, levels between 6 and 10/10 are said to belong in an emergency room, as it progressively worsens from a 6/10, described as severely limiting, requiring emergency care not usually available at an outpatient pain management facility. At a 6/10 level, communication becomes difficult and requires great effort. Assistance to reach the  emergency department may be required. Facial flushing and profuse sweating along with potentially dangerous increases in heart rate and blood pressure will be evident. Effect on ADL:   Timing: Constant Modifying factors: medications, heat BP: (!) 145/75   HR: 78  [name] was last scheduled for an appointment on 08/15/2019 for medication management. During today's appointment we reviewed [name]'s chronic pain status, as well as her outpatient medication regimen.  [name] presents today for medication management and follow-up.  Overall she is doing well.  She is working with [facility] twice a week 45 minutes at a time for leg strengthening and lower back stretching.  She states that this is been really helpful for her.  She endorses low back pain that is returning.  Her previous lumbar RFA at L3, L4, L5 was performed 07/10/2019 on the right side and 06/21/2019 on the left side.  This provides her with approximately 75% pain relief for her low back and referred buttock pain.  She is able to ambulate and has improved function.  Given return of low back pain over the last 3 to 4 weeks, we discussed repeating lumbar radiofrequency ablation.  Risk and benefits reviewed and patient would like to proceed.  The patient  reports no history of drug use. Her body mass index is 46.32 kg/m.  Further details on both, my assessment(s), as well as the proposed treatment plan, please see below.  Controlled Substance Pharmacotherapy Assessment REMS (Risk Evaluation and Mitigation Strategy)  Analgesic: 11/12/2019  1   08/15/2019  Nucynta 50 MG Tablet  45.00  22 Bi Lat   [id number]   [facility] ([id number])   0  40.91 MME  Medicare   [place]   Component 4 mo ago  Summary Note   Comment: ====================================================================  [employer] Select 13 (MW)  ====================================================================  Test               Result     Flag    Units  Drug Present and Declared for Prescription Verification   Tapentadol           4009     EXPECTED  ng/mg creat   Source of tapentadol is a scheduled prescription medication.  Drug Absent but Declared for Prescription Verification   Clonazepam           Not Detected UNEXPECTED ng/mg creat  ====================================================================  Test           Result  Flag  Units   Ref Range   Creatinine       81        mg/dL   >=20      Hart Rochester, RN  11/16/2019  8:16 AM  Sign when Signing Visit Nursing Pain Medication Assessment:  Safety precautions to be maintained throughout the outpatient stay will include: orient to surroundings, keep bed in low position, maintain call bell within reach at all times, provide assistance with transfer out of bed and ambulation.  Medication Inspection Compliance: Pill count conducted under aseptic conditions, in front of the patient. Neither the pills nor the bottle was removed from the patient's sight at any time. Once count was completed pills were immediately returned to the patient in their original bottle.  Medication: Tapentadol (Nucynta) Pill/Patch Count: 41 of 45 pills remain Pill/Patch Appearance: Markings consistent with prescribed medication Bottle Appearance: Standard pharmacy container. Clearly labeled. Filled Date: 05 / 30 / 2021 Last Medication intake:  Today   Pharmacokinetics: Liberation and absorption (onset of action): WNL Distribution (time to peak effect): WNL Metabolism and excretion (duration of action): WNL         Pharmacodynamics: Desired effects: Analgesia: [name] reports >50% benefit. Functional ability: Patient reports that medication allows her to accomplish basic ADLs Clinically meaningful improvement in function (CMIF): Sustained CMIF goals met Perceived effectiveness: Described as relatively  effective, allowing for increase in activities of daily living (ADL) Undesirable effects: Side-effects or Adverse reactions: None reported Monitoring: [place] PMP: PDMP not reviewed this encounter. Online review of the past 45-monthperiod conducted. Compliant with practice rules and regulations Last UDS on record: Summary  Date Value Ref Range Status  06/22/2019 Note  Final    Comment:    ==================================================================== [employer] Select 13 (MW) ==================================================================== Test                             Result       Flag       Units Drug Present and Declared for Prescription Verification   Tapentadol                     4009         EXPECTED   ng/mg creat    Source of tapentadol is a scheduled prescription medication. Drug Absent but Declared for Prescription Verification   Clonazepam                     Not Detected UNEXPECTED ng/mg creat ==================================================================== Test  Result    Flag   Units      Ref Range   Creatinine              81               mg/dL      >=20 ==================================================================== Declared Medications:  The flagging and interpretation on this report are based on the  following declared medications.  Unexpected results may arise from  inaccuracies in the declared medications.  **Note: The testing scope of this panel includes these medications:  Clonazepam  Tapentadol  **Note: The testing scope of this panel does not include the  following reported medications:  Acetaminophen  Amantadine  Amlodipine  Aspirin  Azelastine  Baclofen  Botulinum (Botox)  Carvedilol  Celecoxib  Cholecalciferol  Donepezil  Esomeprazole  Folic Acid  Levothyroxine  Losartan  Melatonin  Montelukast  Multivitamin  Potassium (Klor-Con)  Prednisone  Torsemide  Trazodone  Turmeric  Venlafaxine  Vitamin  B  Vitamin C ==================================================================== For clinical consultation, please call [phone]. ====================================================================    UDS interpretation: Compliant          Medication Assessment Form: Reviewed. Patient indicates being compliant with therapy Treatment compliance: Compliant Risk Assessment Profile: Aberrant behavior: See initial evaluations. None observed or detected today Comorbid factors increasing risk of overdose: See initial evaluation. No additional risks detected today Opioid risk tool (ORT):  Opioid Risk  08/09/2018  Alcohol 1  Illegal Drugs 0  Rx Drugs 0  Alcohol 0  Illegal Drugs 0  Rx Drugs 0  Age between 16-45 years  0  History of Preadolescent Sexual Abuse 0  Psychological Disease 0  Depression 1  Opioid Risk Tool Scoring 2  Opioid Risk Interpretation Low Risk    ORT Scoring interpretation table:  Score <3 = Low Risk for SUD  Score between 4-7 = Moderate Risk for SUD  Score >8 = High Risk for Opioid Abuse   Risk of substance use disorder (SUD): Low  Risk Mitigation Strategies:  Patient Counseling: Covered Patient-Prescriber Agreement (PPA): Present and active  Notification to other healthcare providers: Done  Pharmacologic Plan: No change in therapy, at this time.             Laboratory Chemistry Profile   Renal No results found for: BUN, CREATININE, LABCREA, BCR, GFR, GFRAA, GFRNONAA, SPECGRAV, PHUR, PROTEINUR   Electrolytes No results found for: NA, K, CL, CALCIUM, MG, PHOS   Hepatic No results found for: AST, ALT, ALBUMIN, ALKPHOS, AMYLASE, LIPASE, AMMONIA   ID Lab Results  Component Value Date   SARSCOV2NAA NEGATIVE 12/23/2018     Bone No results found for: VD25OH, [id number], [id number], [id number], 25OHVITD1, 25OHVITD2, 25OHVITD3, TESTOFREE, TESTOSTERONE   Endocrine No results found for: GLUCOSE, GLUCOSEU, HGBA1C, TSH, FREET4, TESTOFREE, TESTOSTERONE,  SHBG, ESTRADIOL, ESTRADIOLPCT, ESTRADIOLFRE, LABPREG, ACTH, CRTSLPL, UCORFRPERLTR, UCORFRPERDAY, CORTISOLBASE, LABPREG   Neuropathy No results found for: VITAMINB12, FOLATE, HGBA1C, HIV   CNS No results found for: COLORCSF, APPEARCSF, RBCCOUNTCSF, WBCCSF, POLYSCSF, LYMPHSCSF, EOSCSF, PROTEINCSF, GLUCCSF, JCVIRUS, CSFOLI, IGGCSF, LABACHR, ACETBL, LABACHR, ACETBL   Inflammation (CRP: Acute   ESR: Chronic) No results found for: CRP, ESRSEDRATE, LATICACIDVEN   Rheumatology No results found for: RF, ANA, LABURIC, URICUR, LYMEIGGIGMAB, LYMEABIGMQN, HLAB27   Coagulation No results found for: INR, LABPROT, APTT, PLT, DDIMER, LABHEMA, VITAMINK1, AT3   Cardiovascular No results found for: BNP, CKTOTAL, CKMB, TROPONINI, HGB, HCT, LABVMA, EPIRU, [id number], NOREPRU, [id number], DOPARU, [id number]   Screening Lab Results  Component Value Date  Gam[place]iel NEGATIVE 12/23/2018   COVIDSOURCE NASOPHARYNGEAL 12/09/2018     Cancer No results found for: CEA, CA125, LABCA2   Allergens No results found for: ALMOND, APPLE, ASPARAGUS, AVOCADO, BANANA, BARLEY, BASIL, BAYLEAF, GREENBEAN, LIMABEAN, WHITEBEAN, BEEFIGE, REDBEET, BLUEBERRY, BROCCOLI, CABBAGE, MELON, CARROT, CASEIN, CASHEWNUT, CAULIFLOWER, CELERY     Note: Lab results reviewed.   Recent Diagnostic Imaging Results  DG PAIN CLINIC C-ARM 1-60 MIN NO REPORT Fluoro was used, but no Radiologist interpretation will be provided.  Please refer to "NOTES" tab for provider progress note.  Complexity Note: Imaging results reviewed. Results shared with [name], using Layman's terms.                               Meds   Current Outpatient Medications:    acetaminophen (TYLENOL) 500 MG tablet, Take 500 mg by mouth every 6 (six) hours as needed., Disp: , Rfl:    Amantadine HCl 100 MG tablet, Take 100 mg by mouth 2 (two) times daily., Disp: , Rfl:    amLODipine (NORVASC) 5 MG tablet, Take 5 mg by mouth daily., Disp: , Rfl:     Ascorbic Acid (VITAMIN C) 1000 MG tablet, Take 1,000 mg daily by mouth., Disp: , Rfl:    aspirin EC 81 MG tablet, Take 81 mg by mouth daily., Disp: , Rfl:    azelastine (ASTELIN) 0.1 % nasal spray, 2 sprays as needed for rhinitis. Use in each nostril as directed, Disp: , Rfl:    baclofen (LIORESAL) 20 MG tablet, Take 20 mg by mouth daily as needed for muscle spasms., Disp: , Rfl:    carvedilol (COREG) 25 MG tablet, Take 25 mg by mouth 2 (two) times daily with a meal. , Disp: , Rfl:    celecoxib (CELEBREX) 200 MG capsule, Take 200 mg by mouth 2 (two) times daily., Disp: , Rfl:    Cholecalciferol (VITAMIN D3) 2000 units TABS, Take 2,000 Units daily by mouth., Disp: , Rfl:    clonazePAM (KLONOPIN) 0.5 MG tablet, Take 0.5 mg by mouth as needed., Disp: , Rfl:    donepezil (ARICEPT) 10 MG tablet, Take 10 mg by mouth at bedtime., Disp: , Rfl:    esomeprazole (NEXIUM) 40 MG capsule, Take 40 mg by mouth daily., Disp: , Rfl:    folic acid (FOLVITE) 161 MCG tablet, Take 400 mcg by mouth 2 (two) times daily., Disp: , Rfl:    furosemide (LASIX) 20 MG tablet, Take by mouth., Disp: , Rfl:    levothyroxine (SYNTHROID, LEVOTHROID) 112 MCG tablet, Take 112 mcg by mouth daily., Disp: , Rfl:    losartan (COZAAR) 100 MG tablet, Take 100 mg by mouth daily., Disp: , Rfl:    Melatonin 3 MG TABS, Take 1 tablet at bedtime by mouth. , Disp: , Rfl:    montelukast (SINGULAIR) 10 MG tablet, Take 10 mg by mouth every evening., Disp: , Rfl:    Multiple Vitamin (MULTIVITAMIN) tablet, Take 1 tablet by mouth daily., Disp: , Rfl:    OnabotulinumtoxinA (BOTOX IJ), Inject every 4 (four) months as directed., Disp: , Rfl:    potassium chloride SA (K-DUR,KLOR-CON) 20 MEQ tablet, Take 20 mEq by mouth 3 (three) times daily., Disp: , Rfl:    [START ON 01/14/2020] tapentadol (NUCYNTA) 50 MG tablet, Take 1 tablet (50 mg total) by mouth 2 (two) times daily as needed for moderate pain or severe pain. Do not take if you have  epilepsy or  a history of seizures. Swallow tablets whole. Do not chew, crush or dissolve., Disp: 45 tablet, Rfl: 0   [START ON 02/13/2020] tapentadol (NUCYNTA) 50 MG tablet, Take 1 tablet (50 mg total) by mouth 2 (two) times daily as needed for moderate pain or severe pain. Do not take if you have epilepsy or a history of seizures. Swallow tablets whole. Do not chew, crush or dissolve., Disp: 45 tablet, Rfl: 0   topiramate (TOPAMAX) 25 MG tablet, topiramate 25 mg tablet, Disp: , Rfl:    torsemide (DEMADEX) 10 MG tablet, Take 40 mg by mouth 2 (two) times daily. , Disp: , Rfl:    traZODone (DESYREL) 50 MG tablet, Take by mouth at bedtime. 50-166m nightly as needed, Disp: , Rfl:    venlafaxine (EFFEXOR) 75 MG tablet, Take 75 mg by mouth 3 (three) times daily., Disp: , Rfl:    amantadine (SYMMETREL) 100 MG capsule, Take 100 mg by mouth 2 (two) times daily., Disp: , Rfl:    B Complex Vitamins (VITAMIN-B COMPLEX PO), Take 100 mg daily by mouth., Disp: , Rfl:    predniSONE (STERAPRED UNI-PAK 21 TAB) 5 MG (21) TBPK tablet, Take 5 mg by mouth daily., Disp: , Rfl:    spironolactone (ALDACTONE) 25 MG tablet, Take 25 mg by mouth daily., Disp: , Rfl:    Turmeric 500 MG TABS, Take 500 mg by mouth. 2 tablets one time per day, Disp: , Rfl:   ROS  Constitutional: Denies any fever or chills Gastrointestinal: No reported hemesis, hematochezia, vomiting, or acute GI distress Musculoskeletal: Denies any acute onset joint swelling, redness, loss of ROM, or weakness Neurological: No reported episodes of acute onset apraxia, aphasia, dysarthria, agnosia, amnesia, paralysis, loss of coordination, or loss of consciousness  Allergies  [name] is allergic to codeine; hydromorphone; latex; oxycodone-acetaminophen; sulfa antibiotics; bupropion; gabapentin; hydrocodone; morphine and related; and procaine.  PCrane Drug: Ms. HGeralynn RileRogozinski  reports no history of drug use. Alcohol:  reports  current alcohol use of about 2.0 standard drinks of alcohol per week. Tobacco:  reports that she has never smoked. She has never used smokeless tobacco. Medical:  has a past medical history of Benign positional vertigo (03/04/2016), [place], Climacteric, Depression, DJD (degenerative joint disease), Dyslipidemia, GERD (gastroesophageal reflux disease), Hypertension, Hypokalemia, Hypothyroidism, IBS (irritable bowel syndrome), OAB (overactive bladder), Ovarian cyst (03/25/2015), Rhinitis, Sleep apnea, and Spasm of thoracolumbar muscle. Surgical: Ms. HGeralynn RileRogozinski  has a past surgical history that includes Ankle reconstruction (Left); Brachioplasty; Cataract extraction; Cholecystectomy; Soft Tissue Tumor Resection; Abdominal hysterectomy (07/12/2014); Joint replacement (Bilateral, 07/2014, 03/2015); nasal reconstrucion; Toe Surgery (Right); Shoulder open rotator cuff repair; Colonoscopy with propofol (N/A, 12/07/2016); and Reduction mammaplasty (Bilateral). Family: family history includes Breast cancer in her paternal aunt.  Constitutional Exam  General appearance: Well nourished, well developed, and well hydrated. In no apparent acute distress Vitals:   11/16/19 0806  BP: (!) 145/75  Pulse: 78  Resp: 18  Temp: 97.9 F (36.6 C)  TempSrc: Temporal  SpO2: 100%  Weight: 287 lb (130.2 kg)  Height: 5' 6"  (1.676 m)   BMI Assessment: Estimated body mass index is 46.32 kg/m as calculated from the following:   Height as of this encounter: 5' 6"  (1.676 m).   Weight as of this encounter: 287 lb (130.2 kg).  BMI interpretation table: BMI level Category Range association with higher incidence of chronic pain  <18 kg/m2 Underweight   18.5-24.9 kg/m2 Ideal body weight   25-29.9 kg/m2 Overweight  Increased incidence by 20%  30-34.9 kg/m2 Obese (Class I) Increased incidence by 68%  35-39.9 kg/m2 Severe obesity (Class II) Increased incidence by 136%  >40 kg/m2 Extreme obesity (Class III)  Increased incidence by 254%   Patient's current BMI Ideal Body weight  Body mass index is 46.32 kg/m. Ideal body weight: 59.3 kg (130 lb 11.7 oz) Adjusted ideal body weight: 87.7 kg (193 lb 3.8 oz)   BMI Readings from Last 4 Encounters:  11/16/19 46.32 kg/m  07/10/19 47.76 kg/m  06/21/19 46.32 kg/m  12/28/18 42.45 kg/m   Wt Readings from Last 4 Encounters:  11/16/19 287 lb (130.2 kg)  07/10/19 287 lb (130.2 kg)  06/21/19 287 lb (130.2 kg)  12/28/18 263 lb (119.3 kg)    Psych/Mental status: Alert, oriented x 3 (person, place, & time)       Eyes: PERLA Respiratory: No evidence of acute respiratory distress  Cervical Spine Exam  Skin & Axial Inspection: No masses, redness, edema, swelling, or associated skin lesions Alignment: Symmetrical Functional ROM: Unrestricted ROM      Stability: No instability detected Muscle Tone/Strength: Functionally intact. No obvious neuro-muscular anomalies detected. Sensory (Neurological): Unimpaired Palpation: No palpable anomalies              Upper Extremity (UE) Exam    Side: Right upper extremity  Side: Left upper extremity  Skin & Extremity Inspection: Skin color, temperature, and hair growth are WNL. No peripheral edema or cyanosis. No masses, redness, swelling, asymmetry, or associated skin lesions. No contractures.  Skin & Extremity Inspection: Skin color, temperature, and hair growth are WNL. No peripheral edema or cyanosis. No masses, redness, swelling, asymmetry, or associated skin lesions. No contractures.  Functional ROM: Unrestricted ROM          Functional ROM: Unrestricted ROM          Muscle Tone/Strength: Functionally intact. No obvious neuro-muscular anomalies detected.  Muscle Tone/Strength: Functionally intact. No obvious neuro-muscular anomalies detected.  Sensory (Neurological): Unimpaired          Sensory (Neurological): Unimpaired          Palpation: No palpable anomalies              Palpation: No palpable anomalies               Provocative Test(s):  Phalen's test: deferred Tinel's test: deferred Apley's scratch test (touch opposite shoulder):  Action 1 (Across chest): deferred Action 2 (Overhead): deferred Action 3 (LB reach): deferred   Provocative Test(s):  Phalen's test: deferred Tinel's test: deferred Apley's scratch test (touch opposite shoulder):  Action 1 (Across chest): deferred Action 2 (Overhead): deferred Action 3 (LB reach): deferred    Thoracic Spine Area Exam  Skin & Axial Inspection: No masses, redness, or swelling Alignment: Symmetrical Functional ROM: Unrestricted ROM Stability: No instability detected Muscle Tone/Strength: Functionally intact. No obvious neuro-muscular anomalies detected. Sensory (Neurological): Unimpaired Muscle strength & Tone: No palpable anomalies  Lumbar Exam  Skin & Axial Inspection: No masses, redness, or swelling Alignment: Symmetrical Functional ROM: Pain restricted ROM however improved after physical therapy       Stability: No instability detected Muscle Tone/Strength: Functionally intact. No obvious neuro-muscular anomalies detected. Sensory (Neurological): Musculoskeletal pain pattern Palpation: No palpable anomalies       Provocative Tests: Hyperextension/rotation test: Positive bilaterally for facet joint pain. Lumbar quadrant test (Kemp's test): (+) bilaterally for facet joint pain. Lateral bending test: deferred today       Patrick's Maneuver: deferred today  FABER* test: deferred today                   S-I anterior distraction/compression test: deferred today         S-I lateral compression test: deferred today         S-I Thigh-thrust test: deferred today         S-I Gaenslen's test: deferred today         *(Flexion, ABduction and External Rotation)  Gait & Posture Assessment  Ambulation: Patient ambulates using a cane Gait: Limited. Using assistive device to ambulate Posture: Difficulty standing up straight,  due to pain   Lower Extremity Exam    Side: Right lower extremity  Side: Left lower extremity  Stability: No instability observed          Stability: No instability observed          Skin & Extremity Inspection: Skin color, temperature, and hair growth are WNL. No peripheral edema or cyanosis. No masses, redness, swelling, asymmetry, or associated skin lesions. No contractures.  Skin & Extremity Inspection: Skin color, temperature, and hair growth are WNL. No peripheral edema or cyanosis. No masses, redness, swelling, asymmetry, or associated skin lesions. No contractures.  Functional ROM: Pain restricted ROM for hip and knee joints          Functional ROM: Pain restricted ROM for hip and knee joints          Muscle Tone/Strength: Functionally intact. No obvious neuro-muscular anomalies detected.  Muscle Tone/Strength: Functionally intact. No obvious neuro-muscular anomalies detected.  Sensory (Neurological): Unimpaired        Sensory (Neurological): Unimpaired        DTR: Patellar: deferred today Achilles: deferred today Plantar: deferred today  DTR: Patellar: deferred today Achilles: deferred today Plantar: deferred today  Palpation: No palpable anomalies  Palpation: No palpable anomalies   Assessment   Status Diagnosis  Having a Flare-up Controlled Having a Flare-up 1. Lumbar facet arthropathy   2. Chronic pain syndrome   3. Lumbar spondylosis   4. Lumbar degenerative disc disease       Plan of Care  Pharmacotherapy (Medications Ordered): Meds ordered this encounter  Medications   tapentadol (NUCYNTA) 50 MG tablet    Sig: Take 1 tablet (50 mg total) by mouth 2 (two) times daily as needed for moderate pain or severe pain. Do not take if you have epilepsy or a history of seizures. Swallow tablets whole. Do not chew, crush or dissolve.    Dispense:  45 tablet    Refill:  0    [place] STOP ACT - Not applicable. Fill one day early if pharmacy is closed on scheduled refill date.    tapentadol (NUCYNTA) 50 MG tablet    Sig: Take 1 tablet (50 mg total) by mouth 2 (two) times daily as needed for moderate pain or severe pain. Do not take if you have epilepsy or a history of seizures. Swallow tablets whole. Do not chew, crush or dissolve.    Dispense:  45 tablet    Refill:  0    [place] STOP ACT - Not applicable. Fill one day early if pharmacy is closed on scheduled refill date.   Medications administered today: Tenna Child. Hollifield [name] had no medications administered during this visit.  Orders:  Orders Placed This Encounter  Procedures   Radiofrequency,Lumbar    Standing Status:   Future    Standing Expiration Date:   05/17/2020    Scheduling Instructions:  Side(s): Left-sided     Level: L3-4, L4-5, Facets ( L3, L4, L5,Medial Branch Nerves)     Sedation: With Sedation     Scheduling Timeframe: As soon as pre-approved    Order Specific Question:   Where will this procedure be performed?    Answer:   [facility]   Radiofrequency,Lumbar    Standing Status:   Future    Standing Expiration Date:   05/17/2020    Scheduling Instructions:     Side(s): RIGHT     Level: L3-4, L4-5, Facets ( L3, L4, L5,Medial Branch Nerves)     Sedation: With Sedation     Scheduling Timeframe: 2 weeks after left    Order Specific Question:   Where will this procedure be performed?    Answer:   [facility]   Planned follow-up:   Return in about 2 weeks (around 11/30/2019) for Left L3,4,5 RFA, Right 2 weeks after.     Status post left L3, L4, L5 RFA 12/01/2017, 12/14/2018, 06/21/2019.  Status post right L3, L4, L5 RFA on 12/22/2017, 12/28/2018, 06/21/2019, 07/10/2019 helps decrease her pain and improve her functional status for axial low back for approximately 6 to 8 months post RFA.  Can repeat every 6 to 12 months.      Recent Visits No visits were found meeting these conditions.  Showing recent visits within past 90 days and meeting all other requirements   Today's  Visits Date Type Provider Dept  11/16/19 Office Visit Gillis Santa, MD [facility]  Showing today's visits and meeting all other requirements   Future Appointments No visits were found meeting these conditions.  Showing future appointments within next 90 days and meeting all other requirements   Primary Care Physician: Kirk Ruths, MD Location: [facility] Note by: Gillis Santa, MD Date: 11/16/2019; Time: 8:35 AM  Note: This dictation was prepared with Dragon dictation. Any transcriptional errors that may result from this process are unintentional.

## 2019-11-16 NOTE — Patient Instructions (Signed)

## 2019-11-22 ENCOUNTER — Other Ambulatory Visit: Payer: Self-pay | Admitting: Family Medicine

## 2019-11-22 ENCOUNTER — Ambulatory Visit
Admission: RE | Admit: 2019-11-22 | Discharge: 2019-11-22 | Disposition: A | Discharge status: Home / Self Care | Payer: Medicare Other | Source: Ambulatory Visit | Attending: Family Medicine | Admitting: Family Medicine

## 2019-11-22 ENCOUNTER — Other Ambulatory Visit: Payer: Self-pay

## 2019-11-22 DIAGNOSIS — M79662 Pain in left lower leg: Secondary | ICD-10-CM | POA: Insufficient documentation

## 2019-11-22 DIAGNOSIS — M7989 Other specified soft tissue disorders: Secondary | ICD-10-CM | POA: Insufficient documentation

## 2019-11-22 DIAGNOSIS — R238 Other skin changes: Secondary | ICD-10-CM

## 2019-12-01 ENCOUNTER — Ambulatory Visit
Admission: RE | Admit: 2019-12-01 | Discharge: 2019-12-01 | Disposition: A | Discharge status: Home / Self Care | Payer: Medicare Other | Source: Ambulatory Visit | Attending: Internal Medicine | Admitting: Internal Medicine

## 2019-12-01 DIAGNOSIS — Z1231 Encounter for screening mammogram for malignant neoplasm of breast: Secondary | ICD-10-CM | POA: Diagnosis not present

## 2019-12-04 ENCOUNTER — Encounter: Payer: Self-pay | Admitting: Student in an Organized Health Care Education/Training Program

## 2019-12-04 ENCOUNTER — Ambulatory Visit (HOSPITAL_BASED_OUTPATIENT_CLINIC_OR_DEPARTMENT_OTHER): Payer: Medicare Other | Admitting: Student in an Organized Health Care Education/Training Program

## 2019-12-04 ENCOUNTER — Other Ambulatory Visit: Payer: Self-pay

## 2019-12-04 ENCOUNTER — Ambulatory Visit
Admission: RE | Admit: 2019-12-04 | Discharge: 2019-12-04 | Disposition: A | Discharge status: Home / Self Care | Payer: Medicare Other | Source: Ambulatory Visit | Attending: Student in an Organized Health Care Education/Training Program | Admitting: Student in an Organized Health Care Education/Training Program

## 2019-12-04 VITALS — BP 156/85 | HR 73 | Temp 98.3°F | Resp 18 | Ht 66.0 in | Wt 289.0 lb

## 2019-12-04 DIAGNOSIS — G894 Chronic pain syndrome: Secondary | ICD-10-CM

## 2019-12-04 DIAGNOSIS — M47816 Spondylosis without myelopathy or radiculopathy, lumbar region: Secondary | ICD-10-CM

## 2019-12-04 MED ORDER — DEXAMETHASONE SODIUM PHOSPHATE 10 MG/ML IJ SOLN
10.0000 mg | Freq: Once | INTRAMUSCULAR | Status: AC
  Administered 2019-12-04: 10 mg
  Filled 2019-12-04: qty 1

## 2019-12-04 MED ORDER — LIDOCAINE HCL 2 % IJ SOLN
20.0000 mL | Freq: Once | INTRAMUSCULAR | Status: AC
  Administered 2019-12-04: 400 mg
  Filled 2019-12-04: qty 20

## 2019-12-04 MED ORDER — FENTANYL CITRATE (PF) 100 MCG/2ML IJ SOLN
25.0000 ug | INTRAMUSCULAR | Status: DC | PRN
  Administered 2019-12-04: 50 ug via INTRAVENOUS
  Filled 2019-12-04: qty 2

## 2019-12-04 MED ORDER — ROPIVACAINE HCL 2 MG/ML IJ SOLN
9.0000 mL | Freq: Once | INTRAMUSCULAR | Status: AC
  Administered 2019-12-04: 9 mL via PERINEURAL
  Filled 2019-12-04: qty 10

## 2019-12-04 NOTE — Patient Instructions (Signed)
Post-procedure Information What to expect: Most procedures involve the use of a local anesthetic (numbing medicine), and a steroid (anti-inflammatory medicine).  The local anesthetics may cause temporary numbness and weakness of the legs or arms, depending on the location of the block. This numbness/weakness may last 4-6 hours, depending on the local anesthetic used. In rare instances, it can last up to 24 hours. While numb, you must be very careful not to injure the extremity.  After any procedure, you could expect the pain to get better within 15-20 minutes. This relief is temporary and may last 4-6 hours. Once the local anesthetics wears off, you could experience discomfort, possibly more than usual, for up to 10 (ten) days. In the case of radiofrequencies, it may last up to 6 weeks. Surgeries may take up to 8 weeks for the healing process. The discomfort is due to the irritation caused by needles going through skin and muscle. To minimize the discomfort, we recommend using ice the first day, and heat from then on. The ice should be applied for 15 minutes on, and 15 minutes off. Keep repeating this cycle until bedtime. Avoid applying the ice directly to the skin, to prevent frostbite. Heat should be used daily, until the pain improves (4-10 days). Be careful not to burn yourself.  Occasionally you may experience muscle spasms or cramps. These occur as a consequence of the irritation caused by the needle sticks to the muscle and the blood that will inevitably be lost into the surrounding muscle tissue. Blood tends to be very irritating to tissues, which tend to react by going into spasm. These spasms may start the same day of your procedure, but they may also take days to develop. This late onset type of spasm or cramp is usually caused by electrolyte imbalances triggered by the steroids, at the level of the kidney. Cramps and spasms tend to respond well to muscle relaxants, multivitamins (some are  triggered by the procedure, but may have their origins in vitamin deficiencies), and "Gatorade", or any sports drinks that can replenish any electrolyte imbalances. (If you are a diabetic, ask your pharmacist to get you a sugar-free brand.) Warm showers or baths may also be helpful. Stretching exercises are highly recommended. General Instructions:  Be alert for signs of possible infection: redness, swelling, heat, red streaks, elevated temperature, and/or fever. These typically appear 4 to 6 days after the procedure. Immediately notify your doctor if you experience unusual bleeding, difficulty breathing, or loss of bowel or bladder control. If you experience increased pain, do not increase your pain medicine intake, unless instructed by your pain physician. Post-Procedure Care:  Be careful in moving about. Muscle spasms in the area of the injection may occur. Applying ice or heat to the area is often helpful. The incidence of spinal headaches after epidural injections ranges between 1.4% and 6%. If you develop a headache that does not seem to respond to conservative therapy, please let your physician know. This can be treated with an epidural blood patch.   Post-procedure numbness or redness is to be expected, however it should average 4 to 6 hours. If numbness and weakness of your extremities begins to develop 4 to 6 hours after your procedure, and is felt to be progressing and worsening, immediately contact your physician.   Diet:  If you experience nausea, do not eat until this sensation goes away. If you had a "Stellate Ganglion Block" for upper extremity "Reflex Sympathetic Dystrophy", do not eat or drink until your   hoarseness goes away. In any case, always start with liquids first and if you tolerate them well, then slowly progress to more solid foods. Activity:  For the first 4 to 6 hours after the procedure, use caution in moving about as you may experience numbness and/or weakness. Use caution in  cooking, using household electrical appliances, and climbing steps. If you need to reach your Doctor call our office: [phone] Monday-Thursday 8:00 am - 4:00 PM    Fridays: Closed     In case of an emergency: In case of emergency, call 911 or go to the nearest emergency room and have the physician there call us.  Interpretation of Procedure Every nerve block has two components: a diagnostic component, and a treatment component. Unrealistic expectations are the most common causes of "perceived failure".  In a perfect world, a single nerve block should be able to completely and permanently eliminate the pain. Sadly, the world is not perfect.  Most pain management nerve blocks are performed using local anesthetics and steroids. Steroids are responsible for any long-term benefit that you may experience. Their purpose is to decrease any chronic swelling that may exist in the area. Steroids begin to work immediately after being injected. However, most patients will not experience any benefits until 5 to 10 days after the injection, when the swelling has come down to the point where they can tell a difference. Steroids will only help if there is swelling to be treated. As such, they can assist with the diagnosis. If effective, they suggest an inflammatory component to the pain, and if ineffective, they rule out inflammation as the main cause or component of the problem. If the problem is one of mechanical compression, you will get no benefit from those steroids.   In the case of local anesthetics, they have a crucial role in the diagnosis of your condition. Most will begin to work within15 to 20 minutes after injection. The duration will depend on the type used (short- vs. Long-acting). It is of outmost importance that patients keep tract of their pain, after the procedure. To assist with this matter, a "Post-procedure Pain Diary" is provided. Make sure to complete it and to bring it back to your  follow-up appointment.  As long as the patient keeps accurate, detailed records of their symptoms after every procedure, and returns to have those interpreted, every procedure will provide us with invaluable information. Even a block that does not provide the patient with any relief, will always provide us with information about the mechanism and the origin of the pain. The only time a nerve block can be considered a waste of time is when patients do not keep track of the results, or do not keep their post-procedure appointment.  Reporting the results back to your physician The Pain Score  Pain is a subjective complaint. It cannot be seen, touched, or measured. We depend entirely on the patient's report of the pain in order to assess your condition and treatment. To evaluate the pain, we use a pain scale, where "0" means "No Pain", and a "10" is "the worst possible pain that you can even imagine" (i.e. something like been eaten alive by a shark or being torn apart by a lion).   You will frequently be asked to rate your pain. Please be as accurate, remember that medical decisions will be based on your responses. Please do not rate your pain above a 10. Doing so is actually interpreted as "symptom magnification" (exaggeration), as   well as lack of understanding with regards to the scale. To put this into perspective, when you tell us that your pain is at a 10 (ten), what you are saying is that there is nothing we can do to make this pain any worse. (Carefully think about that.) Preparing for Procedure with Sedation Instructions: . Oral Intake: Do not eat or drink anything for at least 8 hours prior to your procedure. . Transportation: Public transportation is not allowed. Bring an adult driver. The driver must be physically present in our waiting room before any procedure can be started. . Physical Assistance: Bring an adult capable of physically assisting you, in the event you need help. . Blood Pressure  Medicine: Take your blood pressure medicine with a sip of water the morning of the procedure. . Insulin: Take only  of your normal insulin dose. . Preventing infections: Shower with an antibacterial soap the morning of your procedure. . Build-up your immune system: Take 1000 mg of Vitamin C with every meal (3 times a day) the day prior to your procedure. . Pregnancy: If you are pregnant, call and cancel the procedure. . Sickness: If you have a cold, fever, or any active infections, call and cancel the procedure. . Arrival: You must be in the facility at least 30 minutes prior to your scheduled procedure. . Children: Do not bring children with you. . Dress appropriately: Bring dark clothing that you would not mind if they get stained. . Valuables: Do not bring any jewelry or valuables. Procedure appointments are reserved for interventional treatments only. . No Prescription Refills. . No medication changes will be discussed during procedure appointments. . No disability issues will be discussed. .  

## 2019-12-04 NOTE — Progress Notes (Signed)
Safety precautions to be maintained throughout the outpatient stay will include: orient to surroundings, keep bed in low position, maintain call bell within reach at all times, provide assistance with transfer out of bed and ambulation. Safety precautions to be maintained throughout the outpatient stay will include: orient to surroundings, keep bed in low position, maintain call bell within reach at all times, provide assistance with transfer out of bed and ambulation.  

## 2019-12-04 NOTE — Progress Notes (Signed)
Patient's Name: [name]  MRN: [id number]  Referring Provider: Lauro RegulusAnderson, Marshall W, MD  DOB: 10/23/1945  PCP: Lauro RegulusAnderson, Marshall W, MD  DOS: 12/04/2019  Note by: Edward JollyBilal Gurnoor Sloop, MD  Service setting: Ambulatory outpatient  Specialty: Interventional Pain Management  Patient type: Established  Location: [facility] ([facility]  Visit type: Interventional Procedure   Primary Reason for Visit: Interventional Pain Management Treatment. CC: Back Pain (lumbar left)  Procedure:       Anesthesia, Analgesia, Anxiolysis:  Type: Thermal Lumbar Facet, Medial Branch Radiofrequency Ablation/Neurotomy #3 Level:L3, L4, L5,  Medial Branch Level(s). These levels will denervate the L3-4, and the L4-5 lumbar facet joints. Primary Purpose: Therapeutic Region: Posterolateral Lumbosacral Spine Laterality: Left  (#1 done 12/01/2017 left L3, L4, L5 RFA, #2 done 12/14/2018, #3 done 06/21/19)   Type: Moderate (Conscious) Sedation combined with Local Anesthesia Indication(s): Analgesia and Anxiety Route: Intravenous (IV) IV Access: Secured Sedation: Meaningful verbal contact was maintained at all times during the procedure  Local Anesthetic: Lidocaine 1%   Indications: 1. Lumbar facet arthropathy   2. Lumbar spondylosis   3. Chronic pain syndrome    [name] has been dealing with the above chronic pain for longer than three months and has either failed to respond, was unable to tolerate, or simply did not get enough benefit from other more conservative therapies including, but not limited to: 1. Over-the-counter medications 2. Anti-inflammatory medications 3. Muscle relaxants 4. Membrane stabilizers 5. Opioids 6. Physical therapy 7. Modalities (Heat, ice, etc.) 8. Invasive techniques such as nerve blocks. [name] has attained more than 50% relief of the pain from a series of diagnostic injections conducted in separate occasions.  Pain  Score: Pre-procedure: 3 /10 Post-procedure: 0-No pain/10  Pre-op Assessment:  [name] is a 74 y.o. (year old), female patient, seen today for interventional treatment. She  has a past surgical history that includes Ankle reconstruction (Left); Brachioplasty; Cataract extraction; Cholecystectomy; Soft Tissue Tumor Resection; Abdominal hysterectomy (07/12/2014); Joint replacement (Bilateral, 07/2014, 03/2015); nasal reconstrucion; Toe Surgery (Right); Shoulder open rotator cuff repair; Colonoscopy with propofol (N/A, 12/07/2016); and Reduction mammaplasty (Bilateral). [name] has a current medication list which includes the following prescription(s): acetaminophen, amantadine, amlodipine, vitamin c, aspirin ec, azelastine, b complex vitamins, baclofen, carvedilol, celecoxib, vitamin d3, clonazepam, donepezil, esomeprazole, folic acid, furosemide, levothyroxine, losartan, melatonin, montelukast, multivitamin, onabotulinumtoxina, potassium chloride sa, [START ON 01/14/2020] tapentadol, [START ON 02/13/2020] tapentadol, torsemide, trazodone, venlafaxine, amantadine hcl, prednisone, spironolactone, topiramate, and turmeric, and the following Facility-Administered Medications: fentanyl. Her primarily concern today is the Back Pain (lumbar left)  Initial Vital Signs:  Pulse/HCG Rate: 73ECG Heart Rate: 76 Temp: (!) 97.2 F (36.2 C) Resp: 16 BP: (!) 145/66 SpO2: 98 %  BMI: Estimated body mass index is 46.65 kg/m as calculated from the following:   Height as of this encounter: 5\' 6"  (1.676 m).   Weight as of this encounter: 289 lb (131.1 kg).  Risk Assessment: Allergies: Reviewed. She is allergic to codeine, hydromorphone, latex, oxycodone-acetaminophen, sulfa antibiotics, bupropion, gabapentin, hydrocodone, morphine and related, and procaine.  Allergy Precautions: None required Coagulopathies: Reviewed. None identified.  Blood-thinner therapy: None at this time Active  Infection(s): Reviewed. None identified. [name] is afebrile  Site Confirmation: [name] was asked to confirm the procedure and laterality before marking the site Procedure checklist: Completed Consent: Before the procedure and under the influence of no sedative(s), amnesic(s), or anxiolytics, the patient was informed of the treatment options,  risks and possible complications. To fulfill our ethical and legal obligations, as recommended by the American Medical Association's Code of Ethics, I have informed the patient of my clinical impression; the nature and purpose of the treatment or procedure; the risks, benefits, and possible complications of the intervention; the alternatives, including doing nothing; the risk(s) and benefit(s) of the alternative treatment(s) or procedure(s); and the risk(s) and benefit(s) of doing nothing. The patient was provided information about the general risks and possible complications associated with the procedure. These may include, but are not limited to: failure to achieve desired goals, infection, bleeding, organ or nerve damage, allergic reactions, paralysis, and death. In addition, the patient was informed of those risks and complications associated to Spine-related procedures, such as failure to decrease pain; infection (i.e.: Meningitis, epidural or intraspinal abscess); bleeding (i.e.: epidural hematoma, subarachnoid hemorrhage, or any other type of intraspinal or peri-dural bleeding); organ or nerve damage (i.e.: Any type of peripheral nerve, nerve root, or spinal cord injury) with subsequent damage to sensory, motor, and/or autonomic systems, resulting in permanent pain, numbness, and/or weakness of one or several areas of the body; allergic reactions; (i.e.: anaphylactic reaction); and/or death. Furthermore, the patient was informed of those risks and complications associated with the medications. These include, but are not limited  to: allergic reactions (i.e.: anaphylactic or anaphylactoid reaction(s)); adrenal axis suppression; blood sugar elevation that in diabetics may result in ketoacidosis or comma; water retention that in patients with history of congestive heart failure may result in shortness of breath, pulmonary edema, and decompensation with resultant heart failure; weight gain; swelling or edema; medication-induced neural toxicity; particulate matter embolism and blood vessel occlusion with resultant organ, and/or nervous system infarction; and/or aseptic necrosis of one or more joints. Finally, the patient was informed that Medicine is not an exact science; therefore, there is also the possibility of unforeseen or unpredictable risks and/or possible complications that may result in a catastrophic outcome. The patient indicated having understood very clearly. We have given the patient no guarantees and we have made no promises. Enough time was given to the patient to ask questions, all of which were answered to the patient's satisfaction. [name] has indicated that she wanted to continue with the procedure. Attestation: I, the ordering provider, attest that I have discussed with the patient the benefits, risks, side-effects, alternatives, likelihood of achieving goals, and potential problems during recovery for the procedure that I have provided informed consent. Date  Time: 12/04/2019  9:19 AM  Pre-Procedure Preparation:  Monitoring: As per clinic protocol. Respiration, ETCO2, SpO2, BP, heart rate and rhythm monitor placed and checked for adequate function Safety Precautions: Patient was assessed for positional comfort and pressure points before starting the procedure. Time-out: I initiated and conducted the "Time-out" before starting the procedure, as per protocol. The patient was asked to participate by confirming the accuracy of the "Time Out" information. Verification of the correct person, site, and  procedure were performed and confirmed by me, the nursing staff, and the patient. "Time-out" conducted as per Joint Commission's Universal Protocol (UP.01.01.01). Time: 1012  Description of Procedure:       Position: Prone Laterality: Left Levels:   L3, L4, L5,  Medial Branch Level(s), at the L3-4 and the L4-5 lumbar facet joints. Area Prepped: Lumbosacral Prepping solution: ChloraPrep (2% chlorhexidine gluconate and 70% isopropyl alcohol) Safety Precautions: Aspiration looking for blood return was conducted prior to all injections. At no point did we inject any substances, as a needle was being  advanced. Before injecting, the patient was told to immediately notify me if she was experiencing any new onset of "ringing in the ears, or metallic taste in the mouth". No attempts were made at seeking any paresthesias. Safe injection practices and needle disposal techniques used. Medications properly checked for expiration dates. SDV (single dose vial) medications used. After the completion of the procedure, all disposable equipment used was discarded in the proper designated medical waste containers. Local Anesthesia: Protocol guidelines were followed. The patient was positioned over the fluoroscopy table. The area was prepped in the usual manner. The time-out was completed. The target area was identified using fluoroscopy. A 12-in long, straight, sterile hemostat was used with fluoroscopic guidance to locate the targets for each level blocked. Once located, the skin was marked with an approved surgical skin marker. Once all sites were marked, the skin (epidermis, dermis, and hypodermis), as well as deeper tissues (fat, connective tissue and muscle) were infiltrated with a small amount of a short-acting local anesthetic, loaded on a 10cc syringe with a 25G, 1.5-in  Needle. An appropriate amount of time was allowed for local anesthetics to take effect before proceeding to the next step. Local Anesthetic:  Lidocaine 1.0% The unused portion of the local anesthetic was discarded in the proper designated containers. Technical explanation of process:  Radiofrequency Ablation (RFA)  L3 Medial Branch Nerve RFA: The target area for the L3 medial branch is at the junction of the postero-lateral aspect of the superior articular process and the superior, posterior, and medial edge of the transverse process of L4. Under fluoroscopic guidance, a Radiofrequency needle was inserted until contact was made with os over the superior postero-lateral aspect of the pedicular shadow (target area). Sensory and motor testing was conducted to properly adjust the position of the needle. Once satisfactory placement of the needle was achieved, the numbing solution was slowly injected after negative aspiration for blood. 1 mL of the nerve block solution was injected without difficulty or complication. After waiting for at least 3 minutes, the ablation was performed. Once completed, the needle was removed intact. L4 Medial Branch Nerve RFA: The target area for the L4 medial branch is at the junction of the postero-lateral aspect of the superior articular process and the superior, posterior, and medial edge of the transverse process of L5. Under fluoroscopic guidance, a Radiofrequency needle was inserted until contact was made with os over the superior postero-lateral aspect of the pedicular shadow (target area). Sensory and motor testing was conducted to properly adjust the position of the needle. Once satisfactory placement of the needle was achieved, the numbing solution was slowly injected after negative aspiration for blood. 68mL of the nerve block solution was injected without difficulty or complication. After waiting for at least 3 minutes, the ablation was performed. Once completed, the needle was removed intact. L5 Medial Branch Nerve RFA: The target area for the L5 medial branch is at the junction of the postero-lateral aspect of  the superior articular process of S1 and the superior, posterior, and medial edge of the sacral ala. Under fluoroscopic guidance, a Radiofrequency needle was inserted until contact was made with os over the superior postero-lateral aspect of the pedicular shadow (target area). Sensory and motor testing was conducted to properly adjust the position of the needle. Once satisfactory placement of the needle was achieved, the numbing solution was slowly injected after negative aspiration for blood. 58mL of the nerve block solution was injected without difficulty or complication. After waiting for at least  3 minutes, the ablation was performed. Once completed, the needle was removed intact.  Radiofrequency lesioning (ablation):  Radiofrequency Generator: NeuroTherm [id number] Sensory Stimulation Parameters: 50 Hz was used to locate & identify the nerve, making sure that the needle was positioned such that there was no sensory stimulation below 0.3 V or above 0.7 V. Motor Stimulation Parameters: 2 Hz was used to evaluate the motor component. Care was taken not to lesion any nerves that demonstrated motor stimulation of the lower extremities at an output of less than 2.5 times that of the sensory threshold, or a maximum of 2.0 V. Lesioning Technique Parameters: Standard Radiofrequency settings. (Not bipolar or pulsed.) Temperature Settings: 80 degrees C Lesioning time: 60 seconds Intra-operative Compliance: Compliant Materials & Medications: Needle(s) (Electrode/Cannula) Type: Teflon-coated, curved tip, Radiofrequency needle(s) Gauge: 22G Length: 10cm Numbing solution: 5 cc solution of 4 cc of 0.2% Ropivacaine and 1 cc of Decadron 10mg /cc, 1-1.5 cc injected at each level above prior to ablation. The unused portion of the solution was discarded in the proper designated containers.  Once the entire procedure was completed, the treated area was cleaned, making sure to leave some of the prepping solution back to take  advantage of its long term bactericidal properties.  Illustration of the posterior view of the lumbar spine and the posterior neural structures. Laminae of L2 through S1 are labeled. DPRL5, dorsal primary ramus of L5; DPRS1, dorsal primary ramus of S1; DPR3, dorsal primary ramus of L3; FJ, facet (zygapophyseal) joint L3-L4; I, inferior articular process of L4; LB1, lateral branch of dorsal primary ramus of L1; IAB, inferior articular branches from L3 medial branch (supplies L4-L5 facet joint); IBP, intermediate branch plexus; MB3, medial branch of dorsal primary ramus of L3; NR3, third lumbar nerve root; S, superior articular process of L5; SAB, superior articular branches from L4 (supplies L4-5 facet joint also); TP3, transverse process of L3.  Vitals:   12/04/19 1030 12/04/19 1039 12/04/19 1050 12/04/19 1100  BP: (!) 140/109 140/74 140/80 (!) 156/85  Pulse: 73     Resp: 15 16 14 18   Temp:  98.1 F (36.7 C)  98.3 F (36.8 C)  TempSrc:      SpO2: 99% 99% 99% 100%  Weight:      Height:        Start Time: 1012 hrs. End Time: 1028 hrs.  Imaging Guidance (Spinal):  Type of Imaging Technique: Fluoroscopy Guidance (Spinal) Indication(s): Assistance in needle guidance and placement for procedures requiring needle placement in or near specific anatomical locations not easily accessible without such assistance. Exposure Time: Please see nurses notes. Contrast: None used. Fluoroscopic Guidance: I was personally present during the use of fluoroscopy. "Tunnel Vision Technique" used to obtain the best possible view of the target area. Parallax error corrected before commencing the procedure. "Direction-depth-direction" technique used to introduce the needle under continuous pulsed fluoroscopy. Once target was reached, antero-posterior, oblique, and lateral fluoroscopic projection used confirm needle placement in all planes. Images permanently stored in EMR. Interpretation: No contrast injected. I  personally interpreted the imaging intraoperatively. Adequate needle placement confirmed in multiple planes. Permanent images saved into the patient's record.  Antibiotic Prophylaxis:   Anti-infectives (From admission, onward)   None     Indication(s): None identified  Post-operative Assessment:  Post-procedure Vital Signs:  Pulse/HCG Rate: 7370 Temp: 98.3 F (36.8 C) Resp: 18 BP: (!) 156/85 SpO2: 100 %  EBL: None  Complications: No immediate post-treatment complications observed by team, or reported by patient.  Note: The  patient tolerated the entire procedure well. A repeat set of vitals were taken after the procedure and the patient was kept under observation following institutional policy, for this type of procedure. Post-procedural neurological assessment was performed, showing return to baseline, prior to discharge. The patient was provided with post-procedure discharge instructions, including a section on how to identify potential problems. Should any problems arise concerning this procedure, the patient was given instructions to immediately contact us, at any time, without hesitation. In any case, we plan to contact the patient by telephone for a follow-up status report regarding this interventional procedure.  Comments:  No additional relevant information. 5 out of 5 strength bilateral lower extremity: Plantar flexion, dorsiflexion, knee flexion, knee extension.  Plan of Care    Imaging Orders     DG PA[facility] C-ARM 1-60 MIN NO REPORT Procedure Orders    No procedure(s) ordered today     Medications ordered for procedure: Meds ordered this encounter  Medications  . lidocaine (XYLOCAINE) 2 % (with pres) injection 400 mg  . fentaNYL (SUBLIMAZE) injection 25-50 mcg    Make sure Narcan is available in the pyxis when using this medication. In the event of respiratory depression (RR< 8/min): Titrate NARCAN (naloxone) in increments of 0.1 to 0.2 mg IV at 2-3 minute  intervals, until desired degree of reversal.  . ropivacaine (PF) 2 mg/mL (0.2%) (NAROPIN) injection 9 mL  . dexamethasone (DECADRON) injection 10 mg   Medications administered: We administered lidocaine, fentaNYL, ropivacaine (PF) 2 mg/mL (0.2%), and dexamethasone.  See the medical record for exact dosing, route, and time of administration.  New Prescriptions   No medications on file   Disposition: Discharge home  Discharge Date & Time: 12/04/2019; 1100 hrs.   Physician-requested Follow-up: Return in about 2 weeks (around 12/18/2019) for Right L3,4,5 RFA.  Future Appointments  Date Time Provider [place]  12/25/2019  9:00 AM Gillis Santa, MD [facility] None  02/15/2020  8:30 AM Gillis Santa, MD [facility] None   Primary Care Physician: Kirk Ruths, MD Location: [facility] Note by: Gillis Santa, MD Date: 12/04/2019; Time: 11:02 AM  Disclaimer:  Medicine is not an exact science. The only guarantee in medicine is that nothing is guaranteed. It is important to note that the decision to proceed with this intervention was based on the information collected from the patient. The Data and conclusions were drawn from the patient's questionnaire, the interview, and the physical examination. Because the information was provided in large part by the patient, it cannot be guaranteed that it has not been purposely or unconsciously manipulated. Every effort has been made to obtain as much relevant data as possible for this evaluation. It is important to note that the conclusions that lead to this procedure are derived in large part from the available data. Always take into account that the treatment will also be dependent on availability of resources and existing treatment guidelines, considered by other Pain Management Practitioners as being common knowledge and practice, at the time of the intervention. For Medico-Legal purposes, it is also important to point  out that variation in procedural techniques and pharmacological choices are the acceptable norm. The indications, contraindications, technique, and results of the above procedure should only be interpreted and judged by a Board-Certified Interventional Pain Specialist with extensive familiarity and expertise in the same exact procedure and technique.

## 2019-12-05 ENCOUNTER — Telehealth: Payer: Self-pay

## 2019-12-05 NOTE — Telephone Encounter (Signed)
Post procedure phone call.  Patient states she is doing ok.  

## 2019-12-21 ENCOUNTER — Ambulatory Visit: Payer: Medicare Other | Admitting: Pain Medicine

## 2019-12-25 ENCOUNTER — Other Ambulatory Visit: Payer: Self-pay

## 2019-12-25 ENCOUNTER — Ambulatory Visit
Admission: RE | Admit: 2019-12-25 | Discharge: 2019-12-25 | Disposition: A | Discharge status: Home / Self Care | Payer: Medicare Other | Source: Ambulatory Visit | Attending: Student in an Organized Health Care Education/Training Program | Admitting: Student in an Organized Health Care Education/Training Program

## 2019-12-25 ENCOUNTER — Ambulatory Visit (HOSPITAL_BASED_OUTPATIENT_CLINIC_OR_DEPARTMENT_OTHER): Payer: Medicare Other | Admitting: Student in an Organized Health Care Education/Training Program

## 2019-12-25 ENCOUNTER — Encounter: Payer: Self-pay | Admitting: Student in an Organized Health Care Education/Training Program

## 2019-12-25 VITALS — BP 97/78 | HR 77 | Temp 97.3°F | Resp 15 | Ht 66.0 in | Wt 292.0 lb

## 2019-12-25 DIAGNOSIS — M47816 Spondylosis without myelopathy or radiculopathy, lumbar region: Secondary | ICD-10-CM | POA: Diagnosis present

## 2019-12-25 DIAGNOSIS — G894 Chronic pain syndrome: Secondary | ICD-10-CM | POA: Insufficient documentation

## 2019-12-25 MED ORDER — DEXAMETHASONE SODIUM PHOSPHATE 10 MG/ML IJ SOLN
INTRAMUSCULAR | Status: AC
  Filled 2019-12-25: qty 1

## 2019-12-25 MED ORDER — ROPIVACAINE HCL 2 MG/ML IJ SOLN
9.0000 mL | Freq: Once | INTRAMUSCULAR | Status: AC
  Administered 2019-12-25: 9 mL via PERINEURAL

## 2019-12-25 MED ORDER — ROPIVACAINE HCL 2 MG/ML IJ SOLN
INTRAMUSCULAR | Status: AC
  Filled 2019-12-25: qty 10

## 2019-12-25 MED ORDER — DEXAMETHASONE SODIUM PHOSPHATE 10 MG/ML IJ SOLN
10.0000 mg | Freq: Once | INTRAMUSCULAR | Status: AC
  Administered 2019-12-25: 10 mg

## 2019-12-25 MED ORDER — FENTANYL CITRATE (PF) 100 MCG/2ML IJ SOLN
25.0000 ug | INTRAMUSCULAR | Status: DC | PRN
  Administered 2019-12-25: 50 ug via INTRAVENOUS

## 2019-12-25 MED ORDER — FENTANYL CITRATE (PF) 100 MCG/2ML IJ SOLN
INTRAMUSCULAR | Status: AC
  Filled 2019-12-25: qty 2

## 2019-12-25 MED ORDER — LIDOCAINE HCL 2 % IJ SOLN
20.0000 mL | Freq: Once | INTRAMUSCULAR | Status: AC
  Administered 2019-12-25: 400 mg

## 2019-12-25 MED ORDER — LIDOCAINE HCL 2 % IJ SOLN
INTRAMUSCULAR | Status: AC
  Filled 2019-12-25: qty 10

## 2019-12-25 NOTE — Progress Notes (Signed)
Safety precautions to be maintained throughout the outpatient stay will include: orient to surroundings, keep bed in low position, maintain call bell within reach at all times, provide assistance with transfer out of bed and ambulation.  

## 2019-12-25 NOTE — Patient Instructions (Signed)

## 2019-12-25 NOTE — Progress Notes (Signed)
Patient's Name: [name]  MRN: [id number]  Referring Provider: Lauro Regulus, MD  DOB: 05-Nov-1945  PCP: Lauro Regulus, MD  DOS: 12/25/2019  Note by: Edward Jolly, MD  Service setting: Ambulatory outpatient  Specialty: Interventional Pain Management  Patient type: Established  Location: [facility] (AMB) Pain Management Facility  Visit type: Interventional Procedure   Primary Reason for Visit: Interventional Pain Management Treatment. CC: Back Pain (lumbar bilateral )  Procedure:       Anesthesia, Analgesia, Anxiolysis:  Type: Thermal Lumbar Facet, Medial Branch Radiofrequency Ablation/Neurotomy #3 Level:L3, L4, L5,  Medial Branch Level(s). These levels will denervate the L3-4, and the L4-5 lumbar facet joints. Primary Purpose: Therapeutic Region: Posterolateral Lumbosacral Spine Laterality: Right  (#1 done 12/01/2017 left L3, L4, L5 RFA, #2 done 12/14/2018, #3 done 06/21/19)   Type: Moderate (Conscious) Sedation combined with Local Anesthesia Indication(s): Analgesia and Anxiety Route: Intravenous (IV) IV Access: Secured Sedation: Meaningful verbal contact was maintained at all times during the procedure  Local Anesthetic: Lidocaine 1%   Indications: 1. Lumbar facet arthropathy   2. Lumbar spondylosis   3. Chronic pain syndrome    [name] has been dealing with the above chronic pain for longer than three months and has either failed to respond, was unable to tolerate, or simply did not get enough benefit from other more conservative therapies including, but not limited to: 1. Over-the-counter medications 2. Anti-inflammatory medications 3. Muscle relaxants 4. Membrane stabilizers 5. Opioids 6. Physical therapy 7. Modalities (Heat, ice, etc.) 8. Invasive techniques such as nerve blocks. [name] has attained more than 50% relief of the pain from a series of diagnostic injections conducted in separate occasions.  Pain  Score: Pre-procedure: 3 /10 Post-procedure: 0-No pain/10  Pre-op Assessment:  [name] is a 74 y.o. (year old), female patient, seen today for interventional treatment. She  has a past surgical history that includes Ankle reconstruction (Left); Brachioplasty; Cataract extraction; Cholecystectomy; Soft Tissue Tumor Resection; Abdominal hysterectomy (07/12/2014); Joint replacement (Bilateral, 07/2014, 03/2015); nasal reconstrucion; Toe Surgery (Right); Shoulder open rotator cuff repair; Colonoscopy with propofol (N/A, 12/07/2016); and Reduction mammaplasty (Bilateral). [name] has a current medication list which includes the following prescription(s): acetaminophen, amantadine, amlodipine, vitamin c, aspirin ec, azelastine, b complex vitamins, baclofen, carvedilol, celecoxib, vitamin d3, clonazepam, donepezil, esomeprazole, folic acid, furosemide, levothyroxine, losartan, melatonin, montelukast, multivitamin, onabotulinumtoxina, potassium chloride sa, [START ON 01/14/2020] tapentadol, [START ON 02/13/2020] tapentadol, torsemide, trazodone, venlafaxine, amantadine hcl, prednisone, spironolactone, topiramate, and turmeric, and the following Facility-Administered Medications: fentanyl. Her primarily concern today is the Back Pain (lumbar bilateral )  Initial Vital Signs:  Pulse/HCG Rate: 77ECG Heart Rate: 76 Temp: (!) 97 F (36.1 C) Resp: 16 BP: (!) 142/61 SpO2: 99 %  BMI: Estimated body mass index is 47.13 kg/m as calculated from the following:   Height as of this encounter:  (1.676 m).   Weight as of this encounter: 292 lb (132.5 kg).  Risk Assessment: Allergies: Reviewed. She is allergic to codeine, povidone iodine, hydromorphone, latex, oxycodone-acetaminophen, sulfa antibiotics, bupropion, gabapentin, hydrocodone, morphine and related, and procaine.  Allergy Precautions: None required Coagulopathies: Reviewed. None identified.  Blood-thinner therapy: None at  this time Active Infection(s): Reviewed. None identified. [name] is afebrile  Site Confirmation: [name] was asked to confirm the procedure and laterality before marking the site Procedure checklist: Completed Consent: Before the procedure and under the influence of no sedative(s), amnesic(s), or anxiolytics, the patient was informed  of the treatment options, risks and possible complications. To fulfill our ethical and legal obligations, as recommended by the American Medical Association's Code of Ethics, I have informed the patient of my clinical impression; the nature and purpose of the treatment or procedure; the risks, benefits, and possible complications of the intervention; the alternatives, including doing nothing; the risk(s) and benefit(s) of the alternative treatment(s) or procedure(s); and the risk(s) and benefit(s) of doing nothing. The patient was provided information about the general risks and possible complications associated with the procedure. These may include, but are not limited to: failure to achieve desired goals, infection, bleeding, organ or nerve damage, allergic reactions, paralysis, and death. In addition, the patient was informed of those risks and complications associated to Spine-related procedures, such as failure to decrease pain; infection (i.e.: Meningitis, epidural or intraspinal abscess); bleeding (i.e.: epidural hematoma, subarachnoid hemorrhage, or any other type of intraspinal or peri-dural bleeding); organ or nerve damage (i.e.: Any type of peripheral nerve, nerve root, or spinal cord injury) with subsequent damage to sensory, motor, and/or autonomic systems, resulting in permanent pain, numbness, and/or weakness of one or several areas of the body; allergic reactions; (i.e.: anaphylactic reaction); and/or death. Furthermore, the patient was informed of those risks and complications associated with the medications. These include,  but are not limited to: allergic reactions (i.e.: anaphylactic or anaphylactoid reaction(s)); adrenal axis suppression; blood sugar elevation that in diabetics may result in ketoacidosis or comma; water retention that in patients with history of congestive heart failure may result in shortness of breath, pulmonary edema, and decompensation with resultant heart failure; weight gain; swelling or edema; medication-induced neural toxicity; particulate matter embolism and blood vessel occlusion with resultant organ, and/or nervous system infarction; and/or aseptic necrosis of one or more joints. Finally, the patient was informed that Medicine is not an exact science; therefore, there is also the possibility of unforeseen or unpredictable risks and/or possible complications that may result in a catastrophic outcome. The patient indicated having understood very clearly. We have given the patient no guarantees and we have made no promises. Enough time was given to the patient to ask questions, all of which were answered to the patient's satisfaction. [name] has indicated that she wanted to continue with the procedure. Attestation: I, the ordering provider, attest that I have discussed with the patient the benefits, risks, side-effects, alternatives, likelihood of achieving goals, and potential problems during recovery for the procedure that I have provided informed consent. Date  Time: 12/25/2019  9:18 AM  Pre-Procedure Preparation:  Monitoring: As per clinic protocol. Respiration, ETCO2, SpO2, BP, heart rate and rhythm monitor placed and checked for adequate function Safety Precautions: Patient was assessed for positional comfort and pressure points before starting the procedure. Time-out: I initiated and conducted the "Time-out" before starting the procedure, as per protocol. The patient was asked to participate by confirming the accuracy of the "Time Out" information. Verification of the  correct person, site, and procedure were performed and confirmed by me, the nursing staff, and the patient. "Time-out" conducted as per Joint Commission's Universal Protocol (UP.01.01.01). Time: 1021  Description of Procedure:       Position: Prone Laterality: Right Levels:   L3, L4, L5,  Medial Branch Level(s), at the L3-4 and the L4-5 lumbar facet joints. Area Prepped: Lumbosacral Prepping solution: ChloraPrep (2% chlorhexidine gluconate and 70% isopropyl alcohol) Safety Precautions: Aspiration looking for blood return was conducted prior to all injections. At no point did we inject any substances, as  a needle was being advanced. Before injecting, the patient was told to immediately notify me if she was experiencing any new onset of "ringing in the ears, or metallic taste in the mouth". No attempts were made at seeking any paresthesias. Safe injection practices and needle disposal techniques used. Medications properly checked for expiration dates. SDV (single dose vial) medications used. After the completion of the procedure, all disposable equipment used was discarded in the proper designated medical waste containers. Local Anesthesia: Protocol guidelines were followed. The patient was positioned over the fluoroscopy table. The area was prepped in the usual manner. The time-out was completed. The target area was identified using fluoroscopy. A 12-in long, straight, sterile hemostat was used with fluoroscopic guidance to locate the targets for each level blocked. Once located, the skin was marked with an approved surgical skin marker. Once all sites were marked, the skin (epidermis, dermis, and hypodermis), as well as deeper tissues (fat, connective tissue and muscle) were infiltrated with a small amount of a short-acting local anesthetic, loaded on a 10cc syringe with a 25G, 1.5-in  Needle. An appropriate amount of time was allowed for local anesthetics to take effect before proceeding to the next  step. Local Anesthetic: Lidocaine 1.0% The unused portion of the local anesthetic was discarded in the proper designated containers. Technical explanation of process:  Radiofrequency Ablation (RFA)  L3 Medial Branch Nerve RFA: The target area for the L3 medial branch is at the junction of the postero-lateral aspect of the superior articular process and the superior, posterior, and medial edge of the transverse process of L4. Under fluoroscopic guidance, a Radiofrequency needle was inserted until contact was made with os over the superior postero-lateral aspect of the pedicular shadow (target area). Sensory and motor testing was conducted to properly adjust the position of the needle. Once satisfactory placement of the needle was achieved, the numbing solution was slowly injected after negative aspiration for blood. 1 mL of the nerve block solution was injected without difficulty or complication. After waiting for at least 3 minutes, the ablation was performed. Once completed, the needle was removed intact. L4 Medial Branch Nerve RFA: The target area for the L4 medial branch is at the junction of the postero-lateral aspect of the superior articular process and the superior, posterior, and medial edge of the transverse process of L5. Under fluoroscopic guidance, a Radiofrequency needle was inserted until contact was made with os over the superior postero-lateral aspect of the pedicular shadow (target area). Sensory and motor testing was conducted to properly adjust the position of the needle. Once satisfactory placement of the needle was achieved, the numbing solution was slowly injected after negative aspiration for blood. 47mL of the nerve block solution was injected without difficulty or complication. After waiting for at least 3 minutes, the ablation was performed. Once completed, the needle was removed intact. L5 Medial Branch Nerve RFA: The target area for the L5 medial branch is at the junction of the  postero-lateral aspect of the superior articular process of S1 and the superior, posterior, and medial edge of the sacral ala. Under fluoroscopic guidance, a Radiofrequency needle was inserted until contact was made with os over the superior postero-lateral aspect of the pedicular shadow (target area). Sensory and motor testing was conducted to properly adjust the position of the needle. Once satisfactory placement of the needle was achieved, the numbing solution was slowly injected after negative aspiration for blood. 68mL of the nerve block solution was injected without difficulty or complication. After  waiting for at least 3 minutes, the ablation was performed. Once completed, the needle was removed intact.  Radiofrequency lesioning (ablation):  Radiofrequency Generator: NeuroTherm [id number] Sensory Stimulation Parameters: 50 Hz was used to locate & identify the nerve, making sure that the needle was positioned such that there was no sensory stimulation below 0.3 V or above 0.7 V. Motor Stimulation Parameters: 2 Hz was used to evaluate the motor component. Care was taken not to lesion any nerves that demonstrated motor stimulation of the lower extremities at an output of less than 2.5 times that of the sensory threshold, or a maximum of 2.0 V. Lesioning Technique Parameters: Standard Radiofrequency settings. (Not bipolar or pulsed.) Temperature Settings: 80 degrees C Lesioning time: 60 seconds Intra-operative Compliance: Compliant Materials & Medications: Needle(s) (Electrode/Cannula) Type: Teflon-coated, curved tip, Radiofrequency needle(s) Gauge: 22G Length: 10cm Numbing solution: 5 cc solution of 4 cc of 0.2% Ropivacaine and 1 cc of Decadron 10mg /cc, 1-1.5 cc injected at each level above prior to ablation. The unused portion of the solution was discarded in the proper designated containers.  Once the entire procedure was completed, the treated area was cleaned, making sure to leave some of the  prepping solution back to take advantage of its long term bactericidal properties.  Illustration of the posterior view of the lumbar spine and the posterior neural structures. Laminae of L2 through S1 are labeled. DPRL5, dorsal primary ramus of L5; DPRS1, dorsal primary ramus of S1; DPR3, dorsal primary ramus of L3; FJ, facet (zygapophyseal) joint L3-L4; I, inferior articular process of L4; LB1, lateral branch of dorsal primary ramus of L1; IAB, inferior articular branches from L3 medial branch (supplies L4-L5 facet joint); IBP, intermediate branch plexus; MB3, medial branch of dorsal primary ramus of L3; NR3, third lumbar nerve root; S, superior articular process of L5; SAB, superior articular branches from L4 (supplies L4-5 facet joint also); TP3, transverse process of L3.  Vitals:   12/25/19 1043 12/25/19 1053 12/25/19 1103 12/25/19 1113  BP: (!) 139/92 134/64 92/78 97/78   Pulse:      Resp: 10 15 15 15   Temp:  (!) 97.3 F (36.3 C)  (!) 97.3 F (36.3 C)  TempSrc:      SpO2: 100% 100% 100% 100%  Weight:      Height:        Start Time: 1021 hrs. End Time: 1043 hrs.  Imaging Guidance (Spinal):  Type of Imaging Technique: Fluoroscopy Guidance (Spinal) Indication(s): Assistance in needle guidance and placement for procedures requiring needle placement in or near specific anatomical locations not easily accessible without such assistance. Exposure Time: Please see nurses notes. Contrast: None used. Fluoroscopic Guidance: I was personally present during the use of fluoroscopy. "Tunnel Vision Technique" used to obtain the best possible view of the target area. Parallax error corrected before commencing the procedure. "Direction-depth-direction" technique used to introduce the needle under continuous pulsed fluoroscopy. Once target was reached, antero-posterior, oblique, and lateral fluoroscopic projection used confirm needle placement in all planes. Images permanently stored in  EMR. Interpretation: No contrast injected. I personally interpreted the imaging intraoperatively. Adequate needle placement confirmed in multiple planes. Permanent images saved into the patient's record.  Antibiotic Prophylaxis:   Anti-infectives (From admission, onward)   None     Indication(s): None identified  Post-operative Assessment:  Post-procedure Vital Signs:  Pulse/HCG Rate: 7770 Temp: (!) 97.3 F (36.3 C) Resp: 15 BP: 97/78 SpO2: 100 %  EBL: None  Complications: No immediate post-treatment complications observed by team, or reported  by patient.  Note: The patient tolerated the entire procedure well. A repeat set of vitals were taken after the procedure and the patient was kept under observation following institutional policy, for this type of procedure. Post-procedural neurological assessment was performed, showing return to baseline, prior to discharge. The patient was provided with post-procedure discharge instructions, including a section on how to identify potential problems. Should any problems arise concerning this procedure, the patient was given instructions to immediately contact us, at any time, without hesitation. In any case, we plan to contact the patient by telephone for a follow-up status report regarding this interventional procedure.  Comments:  No additional relevant information. 5 out of 5 strength bilateral lower extremity: Plantar flexion, dorsiflexion, knee flexion, knee extension.  Plan of Care    Imaging Orders     DG [facility] C-ARM 1-60 MIN NO REPORT Procedure Orders    No procedure(s) ordered today     Medications ordered for procedure: Meds ordered this encounter  Medications  . lidocaine (XYLOCAINE) 2 % (with pres) injection 400 mg  . fentaNYL (SUBLIMAZE) injection 25-50 mcg    Make sure Narcan is available in the pyxis when using this medication. In the event of respiratory depression (RR< 8/min): Titrate NARCAN (naloxone) in  increments of 0.1 to 0.2 mg IV at 2-3 minute intervals, until desired degree of reversal.  . ropivacaine (PF) 2 mg/mL (0.2%) (NAROPIN) injection 9 mL  . dexamethasone (DECADRON) injection 10 mg   Medications administered: We administered lidocaine, fentaNYL, ropivacaine (PF) 2 mg/mL (0.2%), and dexamethasone.  See the medical record for exact dosing, route, and time of administration.  New Prescriptions   No medications on file   Disposition: Discharge home  Discharge Date & Time: 12/25/2019; 1114 hrs.   Physician-requested Follow-up: Return for Keep sch. appt.  Future Appointments  Date Time Provider Department Center  02/15/2020  8:30 AM Edward JollyLateef, Khani Paino, MD [facility] None   Primary Care Physician: Lauro RegulusAnderson, Marshall W, MD [facility] Note by: Edward JollyBilal Ladon Vandenberghe, MD Date: 12/25/2019; Time: 1:25 PM  Disclaimer:  Medicine is not an exact science. The only guarantee in medicine is that nothing is guaranteed. It is important to note that the decision to proceed with this intervention was based on the information collected from the patient. The Data and conclusions were drawn from the patient's questionnaire, the interview, and the physical examination. Because the information was provided in large part by the patient, it cannot be guaranteed that it has not been purposely or unconsciously manipulated. Every effort has been made to obtain as much relevant data as possible for this evaluation. It is important to note that the conclusions that lead to this procedure are derived in large part from the available data. Always take into account that the treatment will also be dependent on availability of resources and existing treatment guidelines, considered by other Pain Management Practitioners as being common knowledge and practice, at the time of the intervention. For Medico-Legal purposes, it is also important to point out that variation in procedural techniques and  pharmacological choices are the acceptable norm. The indications, contraindications, technique, and results of the above procedure should only be interpreted and judged by a Board-Certified Interventional Pain Specialist with extensive familiarity and expertise in the same exact procedure and technique.

## 2019-12-26 ENCOUNTER — Telehealth: Payer: Self-pay

## 2019-12-26 ENCOUNTER — Telehealth: Payer: Self-pay | Admitting: Student in an Organized Health Care Education/Training Program

## 2019-12-26 ENCOUNTER — Other Ambulatory Visit: Payer: Self-pay | Admitting: Student in an Organized Health Care Education/Training Program

## 2019-12-26 DIAGNOSIS — G894 Chronic pain syndrome: Secondary | ICD-10-CM

## 2019-12-26 MED ORDER — TAPENTADOL HCL 50 MG PO TABS: 50.0000 mg | ORAL_TABLET | Freq: Two times a day (BID) | ORAL | 0 refills | Status: DC | PRN

## 2019-12-26 MED ORDER — TAPENTADOL HCL 50 MG PO TABS: 50.0000 mg | ORAL_TABLET | Freq: Two times a day (BID) | ORAL | 0 refills | Status: AC | PRN

## 2019-12-26 NOTE — Telephone Encounter (Signed)
Dr Cherylann Ratel, the patient requests that her medications be sent to [facility] , [address]  I added it to her pharmacies, but she did not want me to remove the primary pharmacy.

## 2019-12-26 NOTE — Telephone Encounter (Signed)
Pt was called and no problems reported. 

## 2019-12-26 NOTE — Telephone Encounter (Addendum)
Patient states her [facility] does not have her pain meds and she needs to have sent to another [facility]. Please call patient asap she has no more medications

## 2019-12-27 NOTE — Telephone Encounter (Signed)
I spoke with pharmacist, the issue has already been resolved.

## 2019-12-27 NOTE — Telephone Encounter (Signed)
Patient called stating she tried to get script yesterday evening and pharmacy says they need a code from the physician. Please call pharmacy and see what they need so she can pick up meds.

## 2019-12-27 NOTE — Telephone Encounter (Signed)
Returned call to [employer], the person who answered the phone is unaware of the reason for the call.  The pharmacist is on break at this time. Suggested I call back after 2:30.

## 2020-02-13 ENCOUNTER — Encounter: Payer: Self-pay | Admitting: Student in an Organized Health Care Education/Training Program

## 2020-02-13 ENCOUNTER — Ambulatory Visit
Payer: Medicare Other | Attending: Student in an Organized Health Care Education/Training Program | Admitting: Student in an Organized Health Care Education/Training Program

## 2020-02-13 ENCOUNTER — Other Ambulatory Visit: Payer: Self-pay

## 2020-02-13 ENCOUNTER — Telehealth: Payer: Self-pay | Admitting: Student in an Organized Health Care Education/Training Program

## 2020-02-13 VITALS — BP 142/78 | HR 76 | Temp 97.2°F | Resp 22 | Ht 66.0 in | Wt 294.0 lb

## 2020-02-13 DIAGNOSIS — G894 Chronic pain syndrome: Secondary | ICD-10-CM | POA: Diagnosis present

## 2020-02-13 DIAGNOSIS — M47816 Spondylosis without myelopathy or radiculopathy, lumbar region: Secondary | ICD-10-CM | POA: Insufficient documentation

## 2020-02-13 DIAGNOSIS — M5136 Other intervertebral disc degeneration, lumbar region: Secondary | ICD-10-CM | POA: Diagnosis present

## 2020-02-13 MED ORDER — TAPENTADOL HCL 50 MG PO TABS: 50.0000 mg | ORAL_TABLET | Freq: Two times a day (BID) | ORAL | 0 refills | Status: DC | PRN

## 2020-02-13 MED ORDER — TAPENTADOL HCL 50 MG PO TABS: 50.0000 mg | ORAL_TABLET | Freq: Two times a day (BID) | ORAL | 0 refills | Status: AC | PRN

## 2020-02-13 NOTE — Progress Notes (Signed)
Nursing Pain Medication Assessment:  Safety precautions to be maintained throughout the outpatient stay will include: orient to surroundings, keep bed in low position, maintain call bell within reach at all times, provide assistance with transfer out of bed and ambulation.  Medication Inspection Compliance: Pill count conducted under aseptic conditions, in front of the patient. Neither the pills nor the bottle was removed from the patient's sight at any time. Once count was completed pills were immediately returned to the patient in their original bottle.  Medication: Tapentadol (Nucynta) Pill/Patch Count: 0 of 45 pills remain Pill/Patch Appearance: Markings consistent with prescribed medication Bottle Appearance: Standard pharmacy container. Clearly labeled. Filled Date: 12/26/2019 Last Medication intake:  Ran out of medicine more than 48 hours ago

## 2020-02-13 NOTE — Telephone Encounter (Signed)
I spoke with the pharmacist, they are able to fill the Nucynta, but they do not have enough in stock to fill it today. Patient notified per voicemail.

## 2020-02-13 NOTE — Patient Instructions (Signed)
Two prescriptions for Nucynta were sent to your pharmacy.

## 2020-02-13 NOTE — Telephone Encounter (Signed)
Please call pharmacy and find out why they will not fill patient's meds today. She is out of medications.

## 2020-02-13 NOTE — Progress Notes (Signed)
PROVIDER NOTE: Information contained herein reflects review and annotations entered in association with encounter. Interpretation of such information and data should be left to medically-trained personnel. Information provided to patient can be located elsewhere in the medical record under "Patient Instructions". Document created using STT-dictation technology, any transcriptional errors that may result from process are unintentional.    Patient: [name]  Service Category: E/M  Provider: Gillis Santa, MD  DOB: February 05, 1946  DOS: 02/13/2020  Specialty: Interventional Pain Management  MRN: [id number]  Setting: Ambulatory outpatient  PCP: Kirk Ruths, MD  Type: Established Patient    Referring Provider: Kirk Ruths, MD  Location: Office  Delivery: Face-to-face     HPI  Reason for encounter: [name], a 74 y.o. year old female, is here today for evaluation and management of her Lumbar facet arthropathy [M47.816]. [name]'s primary complain today is Back Pain Last encounter: Practice (12/26/2019). My last encounter with her was on 12/26/2019. Pertinent problems: [name] has Lumbar spondylosis; Lumbar facet arthropathy; Lumbar degenerative disc disease; Spasm of lumbar paraspinous muscle; and Chronic pain syndrome on their pertinent problem list. Pain Assessment: Severity of Chronic pain is reported as a 6 /10. Location: Back Lower/denies. Onset: More than a month ago. Quality: Aching. Timing: Constant. Modifying factor(s): medications. Vitals:  height is 5' 6"  (1.676 m) and weight is 294 lb (133.4 kg). Her temporal temperature is 97.2 F (36.2 C) (abnormal). Her blood pressure is 142/78 (abnormal) and her pulse is 76. Her respiration is 22 (abnormal) and oxygen saturation is 98%.   Patient presents today for medication management and worsening low back pain.  This is related to lumbar facet arthropathy, lumbar  degenerative disc disease, lumbar spondylosis.  Of note, patient has stopped weight watchers dieting.  She is however starting aquatic therapy in the pool.  She continues Nucynta as prescribed.  She does endorse pain relief with the medication and improvement in her functional status.  Her previous lumbar radiofrequency ablation was on 12/25/2019 right L3, L4, L5; left L3, L4, L5 RFA with 12/04/2019.  This did provide pain relief and functional benefit although she is experiencing diminishing analgesic benefit with subsequent RFA's.  We did discuss sprint peripheral nerve stimulation of L4 medial branch.  Risks and benefits reviewed.  Patient states that she would like to proceed with Sprint PNS in the future when she has worsening low back pain.    Pharmacotherapy Assessment   12/27/2019  1   12/26/2019  Nucynta 50 MG Tablet  45.00  22 Bi Lat   [id number]   [facility] (0202)   0/0  40.91 MME  Medicare   [place]      Monitoring: [place] PMP: PDMP reviewed during this encounter.       Pharmacotherapy: No side-effects or adverse reactions reported. Compliance: No problems identified. Effectiveness: Clinically acceptable.  Landis Martins, RN  02/13/2020  1:23 PM  Sign when Signing Visit Nursing Pain Medication Assessment:  Safety precautions to be maintained throughout the outpatient stay will include: orient to surroundings, keep bed in low position, maintain call bell within reach at all times, provide assistance with transfer out of bed and ambulation.  Medication Inspection Compliance: Pill count conducted under aseptic conditions, in front of the patient. Neither the pills nor the bottle was removed from the patient's sight at any time. Once count was completed pills were immediately returned to the patient in their original bottle.  Medication: Tapentadol (Nucynta) Pill/Patch Count:  0 of 45 pills remain Pill/Patch Appearance: Markings consistent with prescribed medication Bottle Appearance: Standard pharmacy  container. Clearly labeled. Filled Date: 12/26/2019 Last Medication intake:  Ran out of medicine more than 48 hours ago    UDS:  Summary  Date Value Ref Range Status  06/22/2019 Note  Final    Comment:    ==================================================================== [employer] Select 13 (MW) ==================================================================== Test                             Result       Flag       Units Drug Present and Declared for Prescription Verification   Tapentadol                     4009         EXPECTED   ng/mg creat    Source of tapentadol is a scheduled prescription medication. Drug Absent but Declared for Prescription Verification   Clonazepam                     Not Detected UNEXPECTED ng/mg creat ==================================================================== Test                      Result    Flag   Units      Ref Range   Creatinine              81               mg/dL      >=20 ==================================================================== Declared Medications:  The flagging and interpretation on this report are based on the  following declared medications.  Unexpected results may arise from  inaccuracies in the declared medications.  **Note: The testing scope of this panel includes these medications:  Clonazepam  Tapentadol  **Note: The testing scope of this panel does not include the  following reported medications:  Acetaminophen  Amantadine  Amlodipine  Aspirin  Azelastine  Baclofen  Botulinum (Botox)  Carvedilol  Celecoxib  Cholecalciferol  Donepezil  Esomeprazole  Folic Acid  Levothyroxine  Losartan  Melatonin  Montelukast  Multivitamin  Potassium (Klor-Con)  Prednisone  Torsemide  Trazodone  Turmeric  Venlafaxine  Vitamin B  Vitamin C ==================================================================== For clinical consultation, please call [phone]. ====================================================================      ROS  Constitutional: Denies any fever or chills Gastrointestinal: No reported hemesis, hematochezia, vomiting, or acute GI distress Musculoskeletal: Low back, bilateral hip pain Neurological: No reported episodes of acute onset apraxia, aphasia, dysarthria, agnosia, amnesia, paralysis, loss of coordination, or loss of consciousness  Medication Review  Biotin, OnabotulinumtoxinA, Vitamin D3, acetaminophen, amLODipine, amantadine, aspirin EC, azelastine, baclofen, carvedilol, celecoxib, donepezil, esomeprazole, folic acid, levothyroxine, losartan, magnesium, melatonin, montelukast, multivitamin, potassium chloride SA, tapentadol, torsemide, traZODone, venlafaxine, vitamin C, and zinc gluconate  History Review  Allergy: [name] is allergic to codeine, povidone iodine, hydromorphone, latex, oxycodone-acetaminophen, sulfa antibiotics, bupropion, gabapentin, hydrocodone, morphine and related, and procaine. Drug: [name]  reports no history of drug use. Alcohol:  reports current alcohol use of about 2.0 standard drinks of alcohol per week. Tobacco:  reports that she has never smoked. She has never used smokeless tobacco. Social: [name]  reports that she has never smoked. She has never used smokeless tobacco. She reports current alcohol use of about 2.0 standard drinks of alcohol per week. She reports that she does not use drugs. 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 Surgical: [name]  has a  past surgical history that includes Ankle reconstruction (Left); Brachioplasty; Cataract extraction; Cholecystectomy; Soft Tissue Tumor Resection; Abdominal hysterectomy (07/12/2014); Joint replacement (Bilateral, 07/2014, 03/2015); nasal reconstrucion; Toe Surgery (Right); Shoulder open rotator cuff repair; Colonoscopy with propofol (N/A, 12/07/2016); and Reduction mammaplasty (Bilateral). Family: family history includes Breast cancer in her paternal aunt.  Laboratory Chemistry Profile   Renal No results found for: BUN, CREATININE, LABCREA, BCR, GFR, GFRAA, GFRNONAA, LABVMA, EPIRU, EPINEPH24HUR, NOREPRU, NOREPI24HUR, DOPARU, [id number]   Hepatic No results found for: AST, ALT, ALBUMIN, ALKPHOS, HCVAB, AMYLASE, LIPASE, AMMONIA   Electrolytes No results found for: NA, K, CL, CALCIUM, MG, PHOS   Bone No results found for: VD25OH, WG665LD3TTS, VX7939QZ0, SP2330QT6, 25OHVITD1, 25OHVITD2, 25OHVITD3, TESTOFREE, TESTOSTERONE   Inflammation (CRP: Acute Phase) (ESR: Chronic Phase) No results found for: CRP, ESRSEDRATE, LATICACIDVEN     Note: Above Lab results reviewed.  Recent Imaging Review  DG PAIN CLINIC C-ARM 1-60 MIN NO REPORT Fluoro was used, but no Radiologist interpretation will be provided.  Please refer to "NOTES" tab for provider progress note. Note: Reviewed        Physical Exam  General appearance: Well nourished, well developed, and well hydrated. In no apparent acute distress Mental status: Alert, oriented x 3 (person, place, & time)       Respiratory: No evidence of acute respiratory distress Eyes: PERLA Vitals: BP (!) 142/78 (BP Location: Right Arm, Patient Position: Sitting, Cuff Size: Normal)   Pulse 76   Temp (!) 97.2 F (36.2 C) (Temporal)   Resp (!) 22   Ht 5' 6"  (1.676 m)   Wt 294 lb (133.4 kg)   SpO2 98%   BMI 47.45 kg/m  BMI: Estimated body mass index is 47.45 kg/m as calculated from the following:   Height as of this encounter: 5' 6"  (1.676 m).   Weight as  of this encounter: 294 lb (133.4 kg). Ideal: Ideal body weight: 59.3 kg (130 lb 11.7 oz) Adjusted ideal body weight: 88.9 kg (196 lb 0.6 oz)  Lumbar Exam  Skin & Axial Inspection: No masses, redness, or swelling Alignment: Symmetrical Functional ROM: Pain restricted ROM however improved after physical therapy       Stability: No instability detected Muscle Tone/Strength: Functionally intact. No obvious neuro-muscular anomalies detected. Sensory (Neurological): Musculoskeletal pain pattern Palpation: No palpable anomalies       Provocative Tests: Hyperextension/rotation test: Positive bilaterally for facet joint pain. Lumbar quadrant test (Kemp's test): (+) bilaterally for facet joint pain. Lateral bending test: deferred today       Patrick's Maneuver: deferred today                   FABER* test: deferred today                   S-I anterior distraction/compression test: deferred today         S-I lateral compression test: deferred today         S-I Thigh-thrust test: deferred today         S-I Gaenslen's test: deferred today         *(Flexion, ABduction and External Rotation)  Gait & Posture Assessment  Ambulation: Patient ambulates using  a cane Gait: Limited. Using assistive device to ambulate Posture: Difficulty standing up straight, due to pain   Lower Extremity Exam    Side: Right lower extremity  Side: Left lower extremity  Stability: No instability observed          Stability: No instability observed          Skin & Extremity Inspection: Skin color, temperature, and hair growth are WNL. No peripheral edema or cyanosis. No masses, redness, swelling, asymmetry, or associated skin lesions. No contractures.  Skin & Extremity Inspection: Skin color, temperature, and hair growth are WNL. No peripheral edema or cyanosis. No masses, redness, swelling, asymmetry, or associated skin lesions. No contractures.  Functional ROM: Pain restricted ROM for hip and knee joints           Functional ROM: Pain restricted ROM for hip and knee joints          Muscle Tone/Strength: Functionally intact. No obvious neuro-muscular anomalies detected.  Muscle Tone/Strength: Functionally intact. No obvious neuro-muscular anomalies detected.  Sensory (Neurological): Unimpaired        Sensory (Neurological): Unimpaired        DTR: Patellar: deferred today Achilles: deferred today Plantar: deferred today  DTR: Patellar: deferred today Achilles: deferred today Plantar: deferred today  Palpation: No palpable anomalies  Palpation: No palpable anomalies     Assessment   Status Diagnosis  Persistent Persistent Persistent 1. Lumbar facet arthropathy   2. Chronic pain syndrome   3. Lumbar spondylosis   4. Lumbar degenerative disc disease      Updated Problems: Problem  Spasm of Lumbar Paraspinous Muscle  Chronic Pain Syndrome  Lumbar Spondylosis  Lumbar Facet Arthropathy  Lumbar Degenerative Disc Disease    Plan of Care   Ms. Soleia J Hollifield [name] has a current medication list which includes the following long-term medication(s): amantadine, amlodipine, azelastine, carvedilol, donepezil, esomeprazole, levothyroxine, losartan, montelukast, potassium chloride sa, torsemide, trazodone, and venlafaxine.  Pharmacotherapy (Medications Ordered): Meds ordered this encounter  Medications  . tapentadol (NUCYNTA) 50 MG tablet    Sig: Take 1 tablet (50 mg total) by mouth 2 (two) times daily as needed for moderate pain or severe pain. For chronic pain syndrome    Dispense:  45 tablet    Refill:  0    [place] STOP ACT - Not applicable. Fill one day early if pharmacy is closed on scheduled refill date.  . tapentadol (NUCYNTA) 50 MG tablet    Sig: Take 1 tablet (50 mg total) by mouth 2 (two) times daily as needed for moderate pain or severe pain. For chronic pain syndrome    Dispense:  45 tablet    Refill:  0     STOP ACT - Not applicable. Fill one day early if pharmacy is  closed on scheduled refill date.   Orders:  Orders Placed This Encounter  Procedures  . Peripheral Nerve Stimulation    Standing Status:   Standing    Number of Occurrences:   2    Standing Expiration Date:   08/12/2020    Scheduling Instructions:     L4 medial branch PNS with SPRINT      PRN procedure      PO valium     Pt will call when she wants to proceed     Start with left side first    Order Specific Question:   Where will this procedure be performed?    Answer:   [facility]   Follow-up  plan:   Return in about 3 months (around 05/14/2020) for Medication Management, in person.     Status post left L3, L4, L5 RFA 12/01/2017, 12/14/2018, 06/21/2019.  Status post right L3, L4, L5 RFA on 12/22/2017, 12/28/2018, 06/21/2019, 07/10/2019 helps decrease her pain and improve her functional status for axial low back for approximately 6 to 8 months post RFA.  Can repeat every 6 to 12 months.  Consider sprint peripheral nerve stimulation of L4 medial branch.  As needed order placed.      Recent Visits Date Type Provider Dept  12/25/19 Procedure visit Gillis Santa, MD [facility]  12/04/19 Procedure visit Gillis Santa, MD [facility]  11/16/19 Office Visit Gillis Santa, MD [facility]  Showing recent visits within past 90 days and meeting all other requirements Today's Visits Date Type Provider Dept  02/13/20 Office Visit Gillis Santa, MD [facility]  Showing today's visits and meeting all other requirements Future Appointments Date Type Provider Dept  04/30/20 Appointment Gillis Santa, MD [facility]  Showing future appointments within next 90 days and meeting all other requirements  I discussed the assessment and treatment plan with the patient. The patient was provided an opportunity to ask questions and all were answered. The patient agreed with the plan and demonstrated an understanding of the instructions.  Patient advised to  call back or seek an in-person evaluation if the symptoms or condition worsens.  Duration of encounter: 75mnutes.  Note by: BGillis Santa MD Date: 02/13/2020; Time: 1:59 PM

## 2020-02-15 ENCOUNTER — Encounter: Payer: Medicare Other | Admitting: Student in an Organized Health Care Education/Training Program

## 2020-02-20 ENCOUNTER — Other Ambulatory Visit: Payer: Self-pay

## 2020-02-20 ENCOUNTER — Ambulatory Visit (INDEPENDENT_AMBULATORY_CARE_PROVIDER_SITE_OTHER): Payer: Medicare Other | Admitting: Vascular Surgery

## 2020-02-20 ENCOUNTER — Encounter (INDEPENDENT_AMBULATORY_CARE_PROVIDER_SITE_OTHER): Payer: Self-pay | Admitting: Vascular Surgery

## 2020-02-20 VITALS — BP 126/75 | HR 80 | Ht 66.0 in | Wt 306.0 lb

## 2020-02-20 DIAGNOSIS — I10 Essential (primary) hypertension: Secondary | ICD-10-CM

## 2020-02-20 DIAGNOSIS — R7303 Prediabetes: Secondary | ICD-10-CM | POA: Diagnosis not present

## 2020-02-20 DIAGNOSIS — M7989 Other specified soft tissue disorders: Secondary | ICD-10-CM | POA: Insufficient documentation

## 2020-02-20 DIAGNOSIS — Z87898 Personal history of other specified conditions: Secondary | ICD-10-CM | POA: Insufficient documentation

## 2020-02-20 DIAGNOSIS — Z6841 Body Mass Index (BMI) 40.0 and over, adult: Secondary | ICD-10-CM

## 2020-02-20 NOTE — Assessment & Plan Note (Signed)
blood glucose control important in reducing the progression of atherosclerotic disease. Also, involved in wound healing. On appropriate medications.  

## 2020-02-20 NOTE — Assessment & Plan Note (Signed)
Weight loss would be of benefit for her leg swelling.

## 2020-02-20 NOTE — Assessment & Plan Note (Signed)
I have had a long discussion with the patient regarding swelling and why it  causes symptoms.  Patient will begin wearing graduated compression stockings class 1 (20-30 mmHg) on a daily basis. She has recently ordered these. The patient will  beginning wearing the stockings first thing in the morning and removing them in the evening. The patient is instructed specifically not to sleep in the stockings.   The patient clearly has a component of lymphedema with multiple episodes of cellulitis scarring and lymphatic channels.  A lymphedema pump will be considered after her venous work-up.  In addition, behavioral modification will be initiated.  This will include frequent elevation, use of over the counter pain medications and exercise such as walking.  I have reviewed systemic causes for chronic edema such as liver, kidney and cardiac etiologies.  The patient denies problems with these organ systems.    Consideration for a lymph pump will also be made based upon the effectiveness of conservative therapy.  This would help to improve the edema control and prevent sequela such as ulcers and infections   Patient should undergo duplex ultrasound of the venous system to ensure that DVT or reflux is not present.  The patient will follow-up with me after the ultrasound.

## 2020-02-20 NOTE — Patient Instructions (Signed)

## 2020-02-20 NOTE — Progress Notes (Signed)
Patient ID: [name], female   DOB: 03/22/1946, 74 y.o.   MRN: [id number]  Chief Complaint  Patient presents with  . New Patient (Initial Visit)    LE swelling     HPI [name] is a 74 y.o. female.  I am asked to see the patient by Dr. Dareen Piano for evaluation of lower extremity swelling.  The patient has had swelling on and off for many years.  She is also had multiple episodes of cellulitis but over the past few months, had severe cellulitis with the worst pain she has ever had.  This started in her left leg and then progressed to bothering the right leg as well.  She went through some wraps which did help get her out of the acute phase, and her swelling is better but far from gone today.  Both legs have been very painful and tired.  When the cellulitis was present, they were exquisitely tender.  She denies any chest pain or shortness of breath.  She has recently ordered compression stockings but did not have these yet.  She will begin wearing these when she gets them.  No previous history of DVT or superficial thrombophlebitis to her knowledge.     Past Medical History:  Diagnosis Date  . Benign positional vertigo 03/04/2016  . Brain injury (HCC)   . Climacteric   . Depression   . 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 Dyslipidemia   . GERD (gastroesophageal reflux disease)   . Hypertension   . Hypokalemia   . Hypothyroidism   . IBS (irritable bowel syndrome)   . OAB (overactive bladder)   . Ovarian cyst 03/25/2015   3cm simple cyst on right ovary  . Rhinitis   . Sleep apnea   . Spasm of thoracolumbar muscle     Past Surgical History:  Procedure Laterality Date  . ABDOMINAL HYSTERECTOMY  07/12/2014  . ANKLE RECONSTRUCTION Left   . BRACHIOPLASTY    . CATARACT EXTRACTION    . CHOLECYSTECTOMY    . COLONOSCOPY WITH PROPOFOL N/A 12/07/2016   Procedure: COLONOSCOPY WITH PROPOFOL;  Surgeon: Scot Jun, MD;   Location: [facility] ENDOSCOPY;  Service: Endoscopy;  Laterality: N/A;  . JOINT REPLACEMENT Bilateral 07/2014, 03/2015  . nasal reconstrucion    . REDUCTION MAMMAPLASTY Bilateral   . SHOULDER OPEN ROTATOR CUFF REPAIR    . SOFT TISSUE TUMOR RESECTION     excision tumor soft tissue neck, and soft tissue subcutaneous axilla  . TOE SURGERY Right    reconstruction right foot 4th and 5th toe     Family History  Problem Relation Age of Onset  . Breast cancer Paternal Aunt   no bleeding or clotting disorders No aneurysms   Social History   Tobacco Use  . Smoking status: Never Smoker  . Smokeless tobacco: Never Used  Vaping Use  . Vaping Use: Never used  Substance Use Topics  . Alcohol use: Yes    Alcohol/week: 2.0 standard drinks    Types: 2 Glasses of wine per week    Comment: per week  . Drug use: No    Allergies  Allergen Reactions  . Codeine Other (See Comments)    Seizure. Tolerates tramadol  . Povidone Iodine Shortness Of Breath    Patient had difficulty breathing during procedure where dura prep was used to clean back.   . Hydromorphone Hives, Itching and Other (See Comments)  Severe itching  . Latex Itching and Other (See Comments)    blisters  . Oxycodone-Acetaminophen Nausea And Vomiting  . Sulfa Antibiotics Itching and Swelling    Swelling eyes, hands, feet. Was at home and didn't go to the hospital  . Bupropion Other (See Comments)  . Gabapentin Other (See Comments)    tremors  . Hydrocodone     Tolerates tramadol  . Morphine And Related Itching and Rash  . Procaine     Current Outpatient Medications  Medication Sig Dispense Refill  . acetaminophen (TYLENOL) 500 MG tablet Take 500 mg by mouth every 6 (six) hours as needed.    Marland Kitchen albuterol (ACCUNEB) 0.63 MG/3ML nebulizer solution albuterol sulfate HFA 90 mcg/actuation aerosol inhaler  INHALE 2 INHALATIONS INTO THE LUNGS EVERY 6 HOURS AS NEEDED FOR WHEEZING    . amantadine (SYMMETREL) 100 MG capsule Take 100  mg by mouth 2 (two) times daily.    Marland Kitchen amLODipine (NORVASC) 5 MG tablet Take 5 mg by mouth daily.    . ARIPiprazole (ABILIFY) 2 MG tablet Take by mouth.    . Ascorbic Acid (VITAMIN C) 1000 MG tablet Take 1,000 mg daily by mouth.    Marland Kitchen aspirin EC 81 MG tablet Take 81 mg by mouth daily.    Marland Kitchen azelastine (ASTELIN) 0.1 % nasal spray 2 sprays as needed for rhinitis. Use in each nostril as directed    . baclofen (LIORESAL) 20 MG tablet Take 20 mg by mouth daily as needed for muscle spasms.    . Biotin 10 MG CAPS Take by mouth daily.    . carvedilol (COREG) 25 MG tablet Take 25 mg by mouth 2 (two) times daily with a meal.     . celecoxib (CELEBREX) 200 MG capsule Take 200 mg by mouth 2 (two) times daily.    . Cholecalciferol (VITAMIN D3) 2000 units TABS Take 2,000 Units daily by mouth.    . donepezil (ARICEPT) 10 MG tablet Take 10 mg by mouth at bedtime.    Marland Kitchen esomeprazole (NEXIUM) 40 MG capsule Take 40 mg by mouth daily.    . fluticasone (FLONASE) 50 MCG/ACT nasal spray fluticasone propionate 50 mcg/actuation nasal spray,suspension  SHAKE LIQUID AND USE 1 SPRAY IN EACH NOSTRIL TWICE DAILY    . folic acid (FOLVITE) 400 MCG tablet Take 400 mcg by mouth at bedtime. 4 tabs at bedtime    . levothyroxine (SYNTHROID, LEVOTHROID) 112 MCG tablet Take 112 mcg by mouth daily.    Marland Kitchen losartan (COZAAR) 100 MG tablet Take 100 mg by mouth daily.    . magnesium 30 MG tablet Take 30 mg by mouth daily.    . Melatonin 3 MG TABS Take 1 tablet at bedtime by mouth.     . montelukast (SINGULAIR) 10 MG tablet Take 10 mg by mouth every evening.    . Multiple Vitamin (MULTIVITAMIN) tablet Take 1 tablet by mouth daily.    . OnabotulinumtoxinA (BOTOX IJ) Inject every 4 (four) months as directed.    . potassium chloride (KLOR-CON) 10 MEQ tablet potassium chloride ER 10 mEq tablet,extended release  TAKE 1 TABLET BY MOUTH DAILY ALONG WITH HIGHER DOSE TORSEMIDE AS NEEDED FOR SWELLING    . potassium chloride SA (K-DUR,KLOR-CON) 20 MEQ  tablet Take 20 mEq by mouth 3 (three) times daily.    Melene Muller ON 03/14/2020] tapentadol (NUCYNTA) 50 MG tablet Take 1 tablet (50 mg total) by mouth 2 (two) times daily as needed for moderate pain or severe pain. For  chronic pain syndrome 45 tablet 0  . [START ON 04/13/2020] tapentadol (NUCYNTA) 50 MG tablet Take 1 tablet (50 mg total) by mouth 2 (two) times daily as needed for moderate pain or severe pain. For chronic pain syndrome 45 tablet 0  . torsemide (DEMADEX) 10 MG tablet Take 20 mg by mouth daily.     . traZODone (DESYREL) 50 MG tablet Take by mouth at bedtime. 50-150mg  nightly as needed    . venlafaxine (EFFEXOR) 75 MG tablet Take 75 mg by mouth 3 (three) times daily.    Marland Kitchen zinc gluconate 50 MG tablet Take 50 mg by mouth daily.     No current facility-administered medications for this visit.      REVIEW OF SYSTEMS (Negative unless checked)  Constitutional: [] Weight loss  [x] Fever  [x] Chills Cardiac: [] Chest pain   [] Chest pressure   [] Palpitations   [] Shortness of breath when laying flat   [] Shortness of breath at rest   [] Shortness of breath with exertion. Vascular:  [x] Pain in legs with walking   [x] Pain in legs at rest   [x] Pain in legs when laying flat   [] Claudication   [] Pain in feet when walking  [] Pain in feet at rest  [] Pain in feet when laying flat   [] History of DVT   [] Phlebitis   [x] Swelling in legs   [] Varicose veins   [] Non-healing ulcers Pulmonary:   [] Uses home oxygen   [] Productive cough   [] Hemoptysis   [] Wheeze  [] COPD   [] Asthma Neurologic:  [] Dizziness  [] Blackouts   [] Seizures   [] History of stroke   [] History of TIA  [] Aphasia   [] Temporary blindness   [] Dysphagia   [] Weakness or numbness in arms   [] Weakness or numbness in legs Musculoskeletal:  [] Arthritis   [] Joint swelling   [] Joint pain   [] Low back pain Hematologic:  [] Easy bruising  [] Easy bleeding   [] Hypercoagulable state   [] Anemic  [] Hepatitis Gastrointestinal:  [] Blood in stool   [] Vomiting blood   [] Gastroesophageal reflux/heartburn   [] Abdominal pain Genitourinary:  [] Chronic kidney disease   [] Difficult urination  [] Frequent urination  [] Burning with urination   [] Hematuria Skin:  [] Rashes   [] Ulcers   [] Wounds Psychological:  [] History of anxiety   [x]  History of major depression.    Physical Exam BP 126/75   Pulse 80   Ht 5\' 6"  (1.676 m)   Wt (!) 306 lb (138.8 kg)   BMI 49.39 kg/m  Gen:  WD/WN, NAD. Obese.  Head: [place]/AT, No temporalis wasting.  Ear/Nose/Throat: Hearing grossly intact, nares w/o erythema or drainage, oropharynx w/o Erythema/Exudate Eyes: Conjunctiva clear, sclera non-icteric  Neck: trachea midline.  No JVD.  Pulmonary:  Good air movement, respirations not labored, no use of accessory muscles  Cardiac: RRR, no JVD Vascular:  Vessel Right Left  Radial Palpable Palpable                          DP 1+ 1+  PT NP NP   Gastrointestinal:. No masses, surgical incisions, or scars. Musculoskeletal: M/S 5/5 throughout.  Extremities without ischemic changes.  No deformity or atrophy.  1+ right lower extremity edema, 1-2+ left lower extremity edema.  Mild erythema bilaterally with moderate stasis dermatitis changes present bilaterally Neurologic: Sensation grossly intact in extremities.  Symmetrical.  Speech is fluent. Motor exam as listed above. Psychiatric: Judgment intact, Mood & affect appropriate for pt's clinical situation. Dermatologic: No rashes or ulcers noted.  No cellulitis or open wounds.  Radiology No results found.  Labs No results found for this or any previous visit (from the past 2160 hour(s)).  Assessment/Plan:  HTN, goal below 140/90 blood pressure control important in reducing the progression of atherosclerotic disease. On appropriate oral medications.   Prediabetes blood glucose control important in reducing the progression of atherosclerotic disease. Also, involved in wound healing. On appropriate medications.   Morbid  obesity with BMI of 40.0-44.9, adult (HCC) Weight loss would be of benefit for her leg swelling.  Swelling of limb I have had a long discussion with the patient regarding swelling and why it  causes symptoms.  Patient will begin wearing graduated compression stockings class 1 (20-30 mmHg) on a daily basis. She has recently ordered these. The patient will  beginning wearing the stockings first thing in the morning and removing them in the evening. The patient is instructed specifically not to sleep in the stockings.   The patient clearly has a component of lymphedema with multiple episodes of cellulitis scarring and lymphatic channels.  A lymphedema pump will be considered after her venous work-up.  In addition, behavioral modification will be initiated.  This will include frequent elevation, use of over the counter pain medications and exercise such as walking.  I have reviewed systemic causes for chronic edema such as liver, kidney and cardiac etiologies.  The patient denies problems with these organ systems.    Consideration for a lymph pump will also be made based upon the effectiveness of conservative therapy.  This would help to improve the edema control and prevent sequela such as ulcers and infections   Patient should undergo duplex ultrasound of the venous system to ensure that DVT or reflux is not present.  The patient will follow-up with me after the ultrasound.        [name] 02/20/2020, 3:06 PM   This note was created with Dragon medical transcription system.  Any errors from dictation are unintentional.

## 2020-02-20 NOTE — Assessment & Plan Note (Signed)
blood pressure control important in reducing the progression of atherosclerotic disease. On appropriate oral medications.  

## 2020-02-28 ENCOUNTER — Other Ambulatory Visit: Payer: Self-pay | Admitting: Student in an Organized Health Care Education/Training Program

## 2020-02-28 ENCOUNTER — Telehealth: Payer: Self-pay | Admitting: *Deleted

## 2020-02-28 DIAGNOSIS — M5136 Other intervertebral disc degeneration, lumbar region: Secondary | ICD-10-CM

## 2020-02-28 DIAGNOSIS — M47816 Spondylosis without myelopathy or radiculopathy, lumbar region: Secondary | ICD-10-CM

## 2020-02-28 NOTE — Progress Notes (Unsigned)
[name] is a pleasant 74 year old female with a history of low back pain secondary to lumbar facet arthropathy, lumbar degenerative disc disease, lumbar spondylosis.  She has had lumbar radiofrequency ablations in the past with diminishing results in regard to analgesic benefit.  We have discussed Sprint peripheral nerve stimulation.  Patient has thought about this and done some research and she would like to proceed with therapy.  We will place order for left Sprint peripheral nerve stimulation therapy.

## 2020-02-28 NOTE — Telephone Encounter (Signed)
I have spoken with Dr. Cherylann Ratel and he has put in an order for PNS. Patient states she would like to proceed with this as well. Alona Bene states it does not need PA and that she will send to Chip [name] so that we can get her scheduled.

## 2020-03-06 ENCOUNTER — Encounter (INDEPENDENT_AMBULATORY_CARE_PROVIDER_SITE_OTHER): Payer: Self-pay | Admitting: Nurse Practitioner

## 2020-03-06 ENCOUNTER — Other Ambulatory Visit: Payer: Self-pay

## 2020-03-06 ENCOUNTER — Ambulatory Visit (INDEPENDENT_AMBULATORY_CARE_PROVIDER_SITE_OTHER): Payer: Medicare Other

## 2020-03-06 ENCOUNTER — Ambulatory Visit (INDEPENDENT_AMBULATORY_CARE_PROVIDER_SITE_OTHER): Payer: Medicare Other | Admitting: Nurse Practitioner

## 2020-03-06 VITALS — BP 170/82 | HR 84 | Resp 16 | Wt 310.8 lb

## 2020-03-06 DIAGNOSIS — M7989 Other specified soft tissue disorders: Secondary | ICD-10-CM

## 2020-03-06 DIAGNOSIS — R600 Localized edema: Secondary | ICD-10-CM | POA: Diagnosis not present

## 2020-03-06 DIAGNOSIS — I10 Essential (primary) hypertension: Secondary | ICD-10-CM | POA: Diagnosis not present

## 2020-03-06 DIAGNOSIS — I890 Lymphedema, not elsewhere classified: Secondary | ICD-10-CM

## 2020-03-11 ENCOUNTER — Telehealth: Payer: Self-pay | Admitting: Student in an Organized Health Care Education/Training Program

## 2020-03-11 NOTE — Telephone Encounter (Signed)
Patient has cellulitis in left leg and is on prednisone and antibiotic since Friday 9-24. Please verify if it is ok to continue with procedure on Wednesday with Dr. Cherylann Ratel and let patient know. Thank you

## 2020-03-12 ENCOUNTER — Telehealth: Payer: Self-pay | Admitting: Student in an Organized Health Care Education/Training Program

## 2020-03-12 NOTE — Progress Notes (Signed)
Subjective:    Patient ID: [name], female    DOB: Nov 04, 1945, 74 y.o.   MRN: [id number] Chief Complaint  Patient presents with  . Follow-up    ultrasound follow up    IRETTA [name] [name] is a 74 y.o. female.  The patient returns today for noninvasive studies regarding lower extremity swelling.  The patient has had multiple episodes of recurrent cellulitis.  Today it appears that the cellulitis has resolved at this time.  She did go through [employer] which did help to resolve the cellulitis as well.  Also did well with help with control her lower extremity edema.  Currently she denies any chest pain or shortness of breath.  The patient recently ordered medical grade 1 compression stockings that have not arrived yet.  She does elevate her lower extremities as much as possible.  She denies any fever, chills, nausea, vomiting or diarrhea.  Today noninvasive study showed no evidence of DVT or superficial thrombophlebitis.  No evidence of deep venous insufficiency or superficial venous reflux seen bilaterally.     Review of Systems  Cardiovascular: Positive for leg swelling.  All other systems reviewed and are negative.      Objective:   Physical Exam HENT:     Head: Normocephalic.  Cardiovascular:     Rate and Rhythm: Normal rate and regular rhythm.     Pulses: Normal pulses.  Pulmonary:     Effort: Pulmonary effort is normal.  Musculoskeletal:     Right lower leg: Edema present.     Left lower leg: Edema present.  Neurological:     Mental Status: She is alert and oriented to person, place, and time.  Psychiatric:        Mood and Affect: Mood normal.        Behavior: Behavior normal.        Thought Content: Thought content normal.        Judgment: Judgment normal.     BP (!) 170/82 (BP Location: Right Arm)   Pulse 84   Resp 16   Wt (!) 310 lb 12.8 oz (141 kg)   BMI 50.16 kg/m   Past Medical History:  Diagnosis Date  . Benign  positional vertigo 03/04/2016  . Brain injury (HCC)   . Climacteric   . Depression   . 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 Dyslipidemia   . GERD (gastroesophageal reflux disease)   . Hypertension   . Hypokalemia   . Hypothyroidism   . IBS (irritable bowel syndrome)   . OAB (overactive bladder)   . Ovarian cyst 03/25/2015   3cm simple cyst on right ovary  . Rhinitis   . Sleep apnea   . Spasm of thoracolumbar muscle     Social History   Socioeconomic History  . Marital status: Legally Separated    Spouse name: Not on file  . Number of children: Not on file  . Years of education: Not on file  . Highest education level: Not on file  Occupational History  . Not on file  Tobacco Use  . Smoking status: Never Smoker  . Smokeless tobacco: Never Used  Vaping Use  . Vaping Use: Never used  Substance and Sexual Activity  . Alcohol use: Yes    Alcohol/week: 2.0 standard drinks    Types: 2 Glasses of wine per week    Comment: per week  . Drug use: No  . Sexual activity: Yes  Other Topics Concern  . Not on file  Social History Narrative  . Not on file   Social Determinants of Health   Financial Resource Strain:   . Difficulty of Paying Living Expenses: Not on file  Food Insecurity:   . Worried About Programme researcher, broadcasting/film/video in the Last Year: Not on file  . Ran Out of Food in the Last Year: Not on file  Transportation Needs:   . Lack of Transportation (Medical): Not on file  . Lack of Transportation (Non-Medical): Not on file  Physical Activity:   . Days of Exercise per Week: Not on file  . Minutes of Exercise per Session: Not on file  Stress:   . Feeling of Stress : Not on file  Social Connections:   . Frequency of Communication with Friends and Family: Not on file  . Frequency of Social Gatherings with Friends and Family: Not on file  . Attends Religious Services: Not on file  . Active Member of Clubs or Organizations: Not on file  . Attends Occupational hygienist Meetings: Not on file  . Marital Status: Not on file  Intimate Partner Violence:   . Fear of Current or Ex-Partner: Not on file  . Emotionally Abused: Not on file  . Physically Abused: Not on file  . Sexually Abused: Not on file    Past Surgical History:  Procedure Laterality Date  . ABDOMINAL HYSTERECTOMY  07/12/2014  . ANKLE RECONSTRUCTION Left   . BRACHIOPLASTY    . CATARACT EXTRACTION    . CHOLECYSTECTOMY    . COLONOSCOPY WITH PROPOFOL N/A 12/07/2016   Procedure: COLONOSCOPY WITH PROPOFOL;  Surgeon: Scot Jun, MD;  Location: [facility] ENDOSCOPY;  Service: Endoscopy;  Laterality: N/A;  . JOINT REPLACEMENT Bilateral 07/2014, 03/2015  . nasal reconstrucion    . REDUCTION MAMMAPLASTY Bilateral   . SHOULDER OPEN ROTATOR CUFF REPAIR    . SOFT TISSUE TUMOR RESECTION     excision tumor soft tissue neck, and soft tissue subcutaneous axilla  . TOE SURGERY Right    reconstruction right foot 4th and 5th toe    Family History  Problem Relation Age of Onset  . Lung cancer Mother   . Lung cancer Father   . Breast cancer Paternal Aunt     Allergies  Allergen Reactions  . Codeine Other (See Comments)    Seizure. Tolerates tramadol  . Povidone Iodine Shortness Of Breath    Patient had difficulty breathing during procedure where dura prep was used to clean back.   . Hydromorphone Hives, Itching and Other (See Comments)    Severe itching  . Latex Itching and Other (See Comments)    blisters  . Oxycodone-Acetaminophen Nausea And Vomiting  . Sulfa Antibiotics Itching and Swelling    Swelling eyes, hands, feet. Was at home and didn't go to the hospital  . Bupropion Other (See Comments)  . Gabapentin Other (See Comments)    tremors  . Hydrocodone     Tolerates tramadol  . Morphine And Related Itching and Rash  . Procaine        Assessment & Plan:   1. Lymphedema Recommend:  No surgery or intervention at this point in time.    I have reviewed my previous  discussion with the patient regarding swelling and why it causes symptoms.  Patient will continue wearing graduated compression stockings class 1 (20-30 mmHg) on a daily basis. The patient will  beginning wearing the stockings first thing in the morning  and removing them in the evening. The patient is instructed specifically not to sleep in the stockings.    In addition, behavioral modification including several periods of elevation of the lower extremities during the day will be continued.  This was reviewed with the patient during the initial visit.  The patient will also continue routine exercise, especially walking on a daily basis as was discussed during the initial visit.    The patient would like to utilize medical grade 1 compression stockings before moving forward with any sort of lymphedema pump.  Since she has most recently ordered her compression socks we will have the patient return in 6 weeks to evaluate the control of swelling.   2. HTN, goal below 140/90 Continue antihypertensive medications as already ordered, these medications have been reviewed and there are no changes at this time.    Current Outpatient Medications on File Prior to Visit  Medication Sig Dispense Refill  . acetaminophen (TYLENOL) 500 MG tablet Take 500 mg by mouth every 6 (six) hours as needed.    Marland Kitchen albuterol (ACCUNEB) 0.63 MG/3ML nebulizer solution albuterol sulfate HFA 90 mcg/actuation aerosol inhaler  INHALE 2 INHALATIONS INTO THE LUNGS EVERY 6 HOURS AS NEEDED FOR WHEEZING    . amantadine (SYMMETREL) 100 MG capsule Take 100 mg by mouth 2 (two) times daily.    Marland Kitchen amLODipine (NORVASC) 5 MG tablet Take 5 mg by mouth daily.    . ARIPiprazole (ABILIFY) 2 MG tablet Take by mouth.    . Ascorbic Acid (VITAMIN C) 1000 MG tablet Take 1,000 mg daily by mouth.    Marland Kitchen aspirin EC 81 MG tablet Take 81 mg by mouth daily.    Marland Kitchen azelastine (ASTELIN) 0.1 % nasal spray 2 sprays as needed for rhinitis. Use in each nostril as  directed    . baclofen (LIORESAL) 20 MG tablet Take 20 mg by mouth daily as needed for muscle spasms.    . Biotin 10 MG CAPS Take by mouth daily.    . carvedilol (COREG) 25 MG tablet Take 25 mg by mouth 2 (two) times daily with a meal.     . celecoxib (CELEBREX) 200 MG capsule Take 200 mg by mouth 2 (two) times daily.    . Cholecalciferol (VITAMIN D3) 2000 units TABS Take 2,000 Units daily by mouth.    . donepezil (ARICEPT) 10 MG tablet Take 10 mg by mouth at bedtime.    Marland Kitchen esomeprazole (NEXIUM) 40 MG capsule Take 40 mg by mouth daily.    . fluticasone (FLONASE) 50 MCG/ACT nasal spray fluticasone propionate 50 mcg/actuation nasal spray,suspension  SHAKE LIQUID AND USE 1 SPRAY IN EACH NOSTRIL TWICE DAILY    . folic acid (FOLVITE) 400 MCG tablet Take 400 mcg by mouth at bedtime. 4 tabs at bedtime    . levothyroxine (SYNTHROID, LEVOTHROID) 112 MCG tablet Take 112 mcg by mouth daily.    Marland Kitchen losartan (COZAAR) 100 MG tablet Take 100 mg by mouth daily.    . magnesium 30 MG tablet Take 30 mg by mouth daily.    . Melatonin 3 MG TABS Take 1 tablet at bedtime by mouth.     . montelukast (SINGULAIR) 10 MG tablet Take 10 mg by mouth every evening.    . Multiple Vitamin (MULTIVITAMIN) tablet Take 1 tablet by mouth daily.    . OnabotulinumtoxinA (BOTOX IJ) Inject every 4 (four) months as directed.    . potassium chloride (KLOR-CON) 10 MEQ tablet potassium chloride ER 10 mEq tablet,extended release  TAKE 1 TABLET BY MOUTH DAILY ALONG WITH HIGHER DOSE TORSEMIDE AS NEEDED FOR SWELLING    . potassium chloride SA (K-DUR,KLOR-CON) 20 MEQ tablet Take 20 mEq by mouth 3 (three) times daily.    Melene Muller. [START ON 03/14/2020] tapentadol (NUCYNTA) 50 MG tablet Take 1 tablet (50 mg total) by mouth 2 (two) times daily as needed for moderate pain or severe pain. For chronic pain syndrome 45 tablet 0  . [START ON 04/13/2020] tapentadol (NUCYNTA) 50 MG tablet Take 1 tablet (50 mg total) by mouth 2 (two) times daily as needed for  moderate pain or severe pain. For chronic pain syndrome 45 tablet 0  . torsemide (DEMADEX) 10 MG tablet Take 20 mg by mouth daily.     . traZODone (DESYREL) 50 MG tablet Take by mouth at bedtime. 50-150mg  nightly as needed    . venlafaxine (EFFEXOR) 75 MG tablet Take 75 mg by mouth 3 (three) times daily.    Marland Kitchen. zinc gluconate 50 MG tablet Take 50 mg by mouth daily.     No current facility-administered medications on file prior to visit.    There are no Patient Instructions on file for this visit. No follow-ups on file.   Georgiana SpinnerFallon E Taimi Towe, NP

## 2020-03-12 NOTE — Telephone Encounter (Signed)
Pt called and stated that she has cellulitis in her leg and is on doxycycline and wants to know if it is still ok to come for her appt tomorrow for PNS or if she needs to reschedule?

## 2020-03-12 NOTE — Telephone Encounter (Signed)
DR Cherylann Ratel states this is not a problem.  Called patient back to let her know that it is ok to keep her appt for tomorrow.

## 2020-03-13 ENCOUNTER — Encounter: Payer: Self-pay | Admitting: Student in an Organized Health Care Education/Training Program

## 2020-03-13 ENCOUNTER — Ambulatory Visit (HOSPITAL_BASED_OUTPATIENT_CLINIC_OR_DEPARTMENT_OTHER): Payer: Medicare Other | Admitting: Student in an Organized Health Care Education/Training Program

## 2020-03-13 ENCOUNTER — Ambulatory Visit
Admission: RE | Admit: 2020-03-13 | Discharge: 2020-03-13 | Disposition: A | Discharge status: Home / Self Care | Payer: Medicare Other | Source: Ambulatory Visit | Attending: Student in an Organized Health Care Education/Training Program | Admitting: Student in an Organized Health Care Education/Training Program

## 2020-03-13 ENCOUNTER — Other Ambulatory Visit: Payer: Self-pay

## 2020-03-13 VITALS — BP 180/98 | HR 80 | Temp 99.3°F | Resp 17 | Ht 66.0 in | Wt 298.0 lb

## 2020-03-13 DIAGNOSIS — M47816 Spondylosis without myelopathy or radiculopathy, lumbar region: Secondary | ICD-10-CM

## 2020-03-13 DIAGNOSIS — G894 Chronic pain syndrome: Secondary | ICD-10-CM | POA: Diagnosis not present

## 2020-03-13 DIAGNOSIS — M5136 Other intervertebral disc degeneration, lumbar region: Secondary | ICD-10-CM

## 2020-03-13 MED ORDER — LIDOCAINE HCL 2 % IJ SOLN
INTRAMUSCULAR | Status: AC
  Filled 2020-03-13: qty 10

## 2020-03-13 MED ORDER — LIDOCAINE HCL 2 % IJ SOLN
20.0000 mL | Freq: Once | INTRAMUSCULAR | Status: AC
  Administered 2020-03-13: 200 mg

## 2020-03-13 NOTE — Progress Notes (Signed)
PROVIDER NOTE: Information contained herein reflects review and annotations entered in association with encounter. Interpretation of such information and data should be left to medically-trained personnel. Information provided to patient can be located elsewhere in the medical record under "Patient Instructions". Document created using STT-dictation technology, any transcriptional errors that may result from process are unintentional.    Patient: [name]  Service Category: Procedure  Provider: Edward Jolly, MD  DOB: [birth year]  DOS: 03/13/2020  Location: [facility]  MRN: [id number]  Setting: Ambulatory - outpatient  Referring Provider: Edward Jolly, MD  Type: Established Patient  Specialty: Interventional Pain Management  PCP: Lauro Regulus, MD   Primary Reason for Visit: Interventional Pain Management Treatment. CC: Back Pain (low and left) and Hip Pain (left)  Procedure:          Anesthesia, Analgesia, Anxiolysis:  Left L4 lumbar medial branch peripheral nerve stimulation, Sprint for low back pain related to lumbar facet arthropathy, lumbar spondylosis  Type: Local Anesthesia   Local Anesthetic: Lidocaine 1-2%  Position: Prone   Indications: 1. Lumbar facet arthropathy   2. Lumbar spondylosis   3. Lumbar degenerative disc disease   4. Chronic pain syndrome    Pain Score: Pre-procedure: 9 /10 Post-procedure: 9 /10   Pre-op Assessment:  [name] is a 74 y.o. (year old), female patient, seen today for interventional treatment. She  has a past surgical history that includes Ankle reconstruction (Left); Brachioplasty; Cataract extraction; Cholecystectomy; Soft Tissue Tumor Resection; Abdominal hysterectomy (07/12/2014); Joint replacement (Bilateral, 07/2014, 03/2015); nasal reconstrucion; Toe Surgery (Right); Shoulder open rotator cuff repair; Colonoscopy with propofol (N/A, 12/07/2016); and Reduction mammaplasty (Bilateral). [name] has a current medication list which includes the following prescription(s): acetaminophen, albuterol, amantadine, amlodipine, aripiprazole, vitamin c, aspirin ec, azelastine, baclofen, biotin, carvedilol, celecoxib, vitamin d3, donepezil, doxycycline, esomeprazole, fluticasone, folic acid, levothyroxine, losartan, magnesium, melatonin, montelukast, multivitamin, onabotulinumtoxina, potassium chloride, potassium chloride sa, [START ON 03/14/2020] tapentadol, [START ON 04/13/2020] tapentadol, torsemide, trazodone, venlafaxine, and zinc gluconate. Her primarily concern today is the Back Pain (low and left) and Hip Pain (left)  Initial Vital Signs:  Pulse/HCG Rate: 80ECG Heart Rate: 78 (nsr) Temp: 99.3 F (37.4 C) Resp: 18 BP: (!) 170/86 SpO2: 98 %  BMI: Estimated body mass index is 48.1 kg/m as calculated from the following:   Height as of this encounter: 5\' 6"  (1.676 m).   Weight as of this encounter: 298 lb (135.2 kg).  Risk Assessment: Allergies: Reviewed. She is allergic to codeine, povidone iodine, hydromorphone, latex, oxycodone-acetaminophen, sulfa antibiotics, bupropion, gabapentin, hydrocodone, morphine and related, and procaine.  Allergy Precautions: None required Coagulopathies: Reviewed. None identified.  Blood-thinner therapy: None at this time Active Infection(s): Reviewed. None identified. [name] is afebrile  Site Confirmation: [name] was asked to confirm the procedure and laterality before marking the site Procedure checklist: Completed Consent: Before the procedure and under the influence of no sedative(s), amnesic(s), or anxiolytics, the patient was informed of the treatment options, risks and possible complications. To fulfill our ethical and legal obligations, as recommended by the American Medical Association's Code of Ethics, I have informed the patient of my clinical impression; the nature and purpose of the  treatment or procedure; the risks, benefits, and possible complications of the intervention; the alternatives, including doing nothing; the risk(s) and benefit(s) of the alternative treatment(s) or procedure(s); and the risk(s) and benefit(s) of doing nothing. The patient was provided information about the general risks and  possible complications associated with the procedure. These may include, but are not limited to: failure to achieve desired goals, infection, bleeding, organ or nerve damage, allergic reactions, paralysis, and death. In addition, the patient was informed of those risks and complications associated to Spine-related procedures, such as failure to decrease pain; infection (i.e.: Meningitis, epidural or intraspinal abscess); bleeding (i.e.: epidural hematoma, subarachnoid hemorrhage, or any other type of intraspinal or peri-dural bleeding); organ or nerve damage (i.e.: Any type of peripheral nerve, nerve root, or spinal cord injury) with subsequent damage to sensory, motor, and/or autonomic systems, resulting in permanent pain, numbness, and/or weakness of one or several areas of the body; allergic reactions; (i.e.: anaphylactic reaction); and/or death. Furthermore, the patient was informed of those risks and complications associated with the medications. These include, but are not limited to: allergic reactions (i.e.: anaphylactic or anaphylactoid reaction(s)); adrenal axis suppression; blood sugar elevation that in diabetics may result in ketoacidosis or comma; water retention that in patients with history of congestive heart failure may result in shortness of breath, pulmonary edema, and decompensation with resultant heart failure; weight gain; swelling or edema; medication-induced neural toxicity; particulate matter embolism and blood vessel occlusion with resultant organ, and/or nervous system infarction; and/or aseptic necrosis of one or more joints. Finally, the patient was informed that  Medicine is not an exact science; therefore, there is also the possibility of unforeseen or unpredictable risks and/or possible complications that may result in a catastrophic outcome. The patient indicated having understood very clearly. We have given the patient no guarantees and we have made no promises. Enough time was given to the patient to ask questions, all of which were answered to the patient's satisfaction. [name] has indicated that she wanted to continue with the procedure. Attestation: I, the ordering provider, attest that I have discussed with the patient the benefits, risks, side-effects, alternatives, likelihood of achieving goals, and potential problems during recovery for the procedure that I have provided informed consent. Date  Time: 03/13/2020  8:46 AM  Pre-Procedure Preparation:  Monitoring: As per clinic protocol. Respiration, ETCO2, SpO2, BP, heart rate and rhythm monitor placed and checked for adequate function Safety Precautions: Patient was assessed for positional comfort and pressure points before starting the procedure. Time-out: I initiated and conducted the "Time-out" before starting the procedure, as per protocol. The patient was asked to participate by confirming the accuracy of the "Time Out" information. Verification of the correct person, site, and procedure were performed and confirmed by me, the nursing staff, and the patient. "Time-out" conducted as per Joint Commission's Universal Protocol (UP.01.01.01). Time: 0917  Description of Procedure:          Left L4 medial branch peripheral nerve stimulation with sprint  After the risks, benefits and alternatives were discussed  with the patient and informed consent was obtained, patient  was placed in the prone position and padded to foster comfort.  Prior to delivery of anesthetics, the painful region was carefully  outlined with a marker. Appropriate skin and bony landmarks  were identified, and  pertinent vascular structures were located.  The skin overlying the lumbosacral spine was prepped and  draped in sterile fashion. Fluoroscopy was used to identify the spinous process and lamina in the center of the  patient's region of pain. After identifying and marking the intended target along the course of the medial branch nerve,  the skin around the planned entry point and the subcutaneous tissues are injected with local anesthetic.  An introducer  needle and stimulating probe were  assembled, inserted and advanced along the intended course of the medial branch nerve as it traverses the lamina medial and inferior to the zygapophyseal joint, taking care to maintain the proper depth of insertion as the introducer is advanced under fluoroscopic. The introducer needle was delivered to a location in proximity to the nerve.  Multiple stimulation parameters were used to deliver stimulation to the medial branch nerve in concert with stimulating at multiple positions around the nerve. Nerve target  acquisition was confirmed noting generation of paresthesias in the paravertebral regions corresponding to the level being stimulated as well as rhythmic thumping within the multifidi, the  latter being further corroborated via palpation.   Various electrical parameter combinations were tested, and the lead location was adjusted (physically relocated) until the patient  indicated paresthesia or muscle tension overlapping the distribution of the patient's typical region of pain.   The stimulating probe was removed from the introducer and a percutaneous lead was guided through the needle and delivered to a location in similar proximity to the nerve. Final  location was verified with electrical stimulation and documented with fluoroscopy & ultrasound.   The introducer needle was removed, and the exposed end of the percutaneous lead was attached to an external stimulator unit. Various electrical parameter  combinations were again  tested until the patient indicated paresthesia or muscle tension  overlapping the distribution of the patient's typical region of pain.   After confirming that lead impedance was in the normal range, the external unit was detached, the needle was removed, and the lead was anchored at the skin.  The lead was threaded into the connector block and electrical continuity and desired patient response was confirmed. The connector block was attached to the external  stimulator unit.The site was covered with a sterile occlusive dressing and  a fluoroscopic and ultrasound image was taken to document final placement. The patient was observed for stability of vital signs and comfort.     Illustration of the posterior view of the lumbar spine and the posterior neural structures. Laminae of L2 through S1 are labeled. DPRL5, dorsal primary ramus of L5; DPRS1, dorsal primary ramus of S1; DPR3, dorsal primary ramus of L3; FJ, facet (zygapophyseal) joint L3-L4; I, inferior articular process of L4; LB1, lateral branch of dorsal primary ramus of L1; IAB, inferior articular branches from L3 medial branch (supplies L4-L5 facet joint); IBP, intermediate branch plexus; MB3, medial branch of dorsal primary ramus of L3; NR3, third lumbar nerve root; S, superior articular process of L5; SAB, superior articular branches from L4 (supplies L4-5 facet joint also); TP3, transverse process of L3.  Vitals:   03/13/20 0850 03/13/20 0918 03/13/20 0923 03/13/20 0928  BP: (!) 170/86 (!) 177/94 (!) 183/97 (!) 180/98  Pulse: 80     Resp: 18 18 20 17   Temp: 99.3 F (37.4 C)     TempSrc: Temporal     SpO2: 98% 96% 96% 97%  Weight: 298 lb (135.2 kg)     Height: 5\' 6"  (1.676 m)        Start Time: 0917 hrs. End Time: 0928 hrs.  Imaging Guidance (Spinal):          Type of Imaging Technique: Fluoroscopy Guidance (Spinal)  & ultrasound guidance to confirm multifidus activation Indication(s): Assistance in  needle guidance and placement for procedures requiring needle placement in or near specific anatomical locations not easily accessible without such assistance. Exposure Time: Please see nurses notes. Contrast: None  used. Fluoroscopic Guidance: I was personally present during the use of fluoroscopy. "Tunnel Vision Technique" used to obtain the best possible view of the target area. Parallax error corrected before commencing the procedure. "Direction-depth-direction" technique used to introduce the needle under continuous pulsed fluoroscopy. Once target was reached, antero-posterior, oblique, and lateral fluoroscopic projection used confirm needle placement in all planes. Images permanently stored in EMR. Interpretation: No contrast injected. I personally interpreted the imaging intraoperatively. Adequate needle placement confirmed in multiple planes. Permanent images saved into the patient's record.  Antibiotic Prophylaxis:   Anti-infectives (From admission, onward)   None     Indication(s): None identified  Post-operative Assessment:  Post-procedure Vital Signs:  Pulse/HCG Rate: 8078 (nsr) Temp: 99.3 F (37.4 C) Resp: 17 BP: (!) 180/98 SpO2: 97 %  EBL: None  Complications: No immediate post-treatment complications observed by team, or reported by patient.  Note: The patient tolerated the entire procedure well. A repeat set of vitals were taken after the procedure and the patient was kept under observation following institutional policy, for this type of procedure. Post-procedural neurological assessment was performed, showing return to baseline, prior to discharge. The patient was provided with post-procedure discharge instructions, including a section on how to identify potential problems. Should any problems arise concerning this procedure, the patient was given instructions to immediately contact us, at any time, without hesitation. In any case, we plan to contact the patient by  telephone for a follow-up status report regarding this interventional procedure.  Comments:  No additional relevant information.  Plan of Care  Orders:  Orders Placed This Encounter  Procedures  . Peripheral Nerve Stimulation    Standing Status:   Future    Standing Expiration Date:   09/10/2020    Scheduling Instructions:     SPRINT PNS lumbar medial branch     Right L4    Order Specific Question:   Where will this procedure be performed?    Answer:   [facility]  . DG [facility] C-ARM 1-60 MIN NO REPORT    Intraoperative interpretation by procedural physician at [facility].    Standing Status:   Standing    Number of Occurrences:   1    Order Specific Question:   Reason for exam:    Answer:   Assistance in needle guidance and placement for procedures requiring needle placement in or near specific anatomical locations not easily accessible without such assistance.    Medications ordered for procedure: Meds ordered this encounter  Medications  . lidocaine (XYLOCAINE) 2 % (with pres) injection 400 mg   Medications administered: We administered lidocaine.  See the medical record for exact dosing, route, and time of administration.  Follow-up plan:   Return in about 2 weeks (around 03/27/2020) for Right L4 Sprint PNS.      Status post left L3, L4, L5 RFA 12/01/2017, 12/14/2018, 06/21/2019.  Status post right L3, L4, L5 RFA on 12/22/2017, 12/28/2018, 06/21/2019, 07/10/2019 helps decrease her pain and improve her functional status for axial low back for approximately 6 to 8 months post RFA.  Can repeat every 6 to 12 months.  Left L4 Sprint peripheral nerve stimulation 03/13/2020.     Recent Visits Date Type Provider Dept  02/13/20 Office Visit Edward Jolly, MD [facility]  12/25/19 Procedure visit Edward Jolly, MD [facility]  Showing recent visits within past 90 days and meeting all other requirements Today's Visits Date Type Provider Dept   03/13/20 Procedure visit Korie Brabson,  Roselie SkinnerBilal, MD [facility]  Showing today's visits and meeting all other requirements Future Appointments Date Type Provider Dept  03/25/20 Appointment Edward JollyLateef, Isidoro Santillana, MD [facility]  05/01/20 Appointment Edward JollyLateef, Mirabella Hilario, MD [facility]  Showing future appointments within next 90 days and meeting all other requirements  Disposition: Discharge home  Discharge (Date  Time): 03/13/2020;   hrs.   Primary Care Physician: Lauro RegulusAnderson, Marshall W, MD [facility] Note by: Edward JollyBilal Neenah Canter, MD Date: 03/13/2020; Time: 10:01 AM  Disclaimer:  Medicine is not an exact science. The only guarantee in medicine is that nothing is guaranteed. It is important to note that the decision to proceed with this intervention was based on the information collected from the patient. The Data and conclusions were drawn from the patient's questionnaire, the interview, and the physical examination. Because the information was provided in large part by the patient, it cannot be guaranteed that it has not been purposely or unconsciously manipulated. Every effort has been made to obtain as much relevant data as possible for this evaluation. It is important to note that the conclusions that lead to this procedure are derived in large part from the available data. Always take into account that the treatment will also be dependent on availability of resources and existing treatment guidelines, considered by other Pain Management Practitioners as being common knowledge and practice, at the time of the intervention. For Medico-Legal purposes, it is also important to point out that variation in procedural techniques and pharmacological choices are the acceptable norm. The indications, contraindications, technique, and results of the above procedure should only be interpreted and judged by a Board-Certified Interventional Pain Specialist with extensive  familiarity and expertise in the same exact procedure and technique.

## 2020-03-13 NOTE — Progress Notes (Signed)
Safety precautions to be maintained throughout the outpatient stay will include: orient to surroundings, keep bed in low position, maintain call bell within reach at all times, provide assistance with transfer out of bed and ambulation.  

## 2020-03-13 NOTE — Patient Instructions (Signed)

## 2020-03-14 ENCOUNTER — Telehealth: Payer: Self-pay

## 2020-03-14 NOTE — Telephone Encounter (Signed)
Post procedure follow up.  LM 

## 2020-03-25 ENCOUNTER — Other Ambulatory Visit: Payer: Self-pay

## 2020-03-25 ENCOUNTER — Ambulatory Visit
Admission: RE | Admit: 2020-03-25 | Discharge: 2020-03-25 | Disposition: A | Discharge status: Home / Self Care | Payer: Medicare Other | Source: Ambulatory Visit | Attending: Student in an Organized Health Care Education/Training Program | Admitting: Student in an Organized Health Care Education/Training Program

## 2020-03-25 ENCOUNTER — Ambulatory Visit (HOSPITAL_BASED_OUTPATIENT_CLINIC_OR_DEPARTMENT_OTHER): Payer: Medicare Other | Admitting: Student in an Organized Health Care Education/Training Program

## 2020-03-25 ENCOUNTER — Encounter: Payer: Self-pay | Admitting: Student in an Organized Health Care Education/Training Program

## 2020-03-25 ENCOUNTER — Telehealth: Payer: Self-pay | Admitting: *Deleted

## 2020-03-25 VITALS — BP 194/101 | HR 86 | Temp 97.9°F | Resp 16 | Ht 66.0 in | Wt 297.0 lb

## 2020-03-25 DIAGNOSIS — Z882 Allergy status to sulfonamides status: Secondary | ICD-10-CM | POA: Diagnosis not present

## 2020-03-25 DIAGNOSIS — M4696 Unspecified inflammatory spondylopathy, lumbar region: Secondary | ICD-10-CM

## 2020-03-25 DIAGNOSIS — G894 Chronic pain syndrome: Secondary | ICD-10-CM | POA: Diagnosis not present

## 2020-03-25 DIAGNOSIS — M5136 Other intervertebral disc degeneration, lumbar region: Secondary | ICD-10-CM | POA: Diagnosis not present

## 2020-03-25 DIAGNOSIS — Z885 Allergy status to narcotic agent status: Secondary | ICD-10-CM | POA: Insufficient documentation

## 2020-03-25 DIAGNOSIS — Z888 Allergy status to other drugs, medicaments and biological substances status: Secondary | ICD-10-CM | POA: Insufficient documentation

## 2020-03-25 DIAGNOSIS — M47816 Spondylosis without myelopathy or radiculopathy, lumbar region: Secondary | ICD-10-CM | POA: Diagnosis not present

## 2020-03-25 DIAGNOSIS — G8929 Other chronic pain: Secondary | ICD-10-CM | POA: Insufficient documentation

## 2020-03-25 MED ORDER — ROPIVACAINE HCL 2 MG/ML IJ SOLN
9.0000 mL | Freq: Once | INTRAMUSCULAR | Status: AC
  Administered 2020-03-25: 10 mL via PERINEURAL

## 2020-03-25 MED ORDER — DIAZEPAM 5 MG PO TABS
5.0000 mg | ORAL_TABLET | Freq: Once | ORAL | Status: AC
  Administered 2020-03-25: 5 mg via ORAL

## 2020-03-25 MED ORDER — ROPIVACAINE HCL 2 MG/ML IJ SOLN
INTRAMUSCULAR | Status: AC
  Filled 2020-03-25: qty 10

## 2020-03-25 MED ORDER — LIDOCAINE HCL 2 % IJ SOLN
20.0000 mL | Freq: Once | INTRAMUSCULAR | Status: AC
  Administered 2020-03-25: 200 mg

## 2020-03-25 MED ORDER — DIAZEPAM 5 MG PO TABS
ORAL_TABLET | ORAL | Status: AC
  Filled 2020-03-25: qty 1

## 2020-03-25 MED ORDER — LIDOCAINE HCL 2 % IJ SOLN
INTRAMUSCULAR | Status: AC
  Filled 2020-03-25: qty 10

## 2020-03-25 MED ORDER — DEXAMETHASONE SODIUM PHOSPHATE 10 MG/ML IJ SOLN
INTRAMUSCULAR | Status: AC
  Filled 2020-03-25: qty 1

## 2020-03-25 MED ORDER — DEXAMETHASONE SODIUM PHOSPHATE 10 MG/ML IJ SOLN
10.0000 mg | Freq: Once | INTRAMUSCULAR | Status: AC
  Administered 2020-03-25: 10 mg

## 2020-03-25 NOTE — Telephone Encounter (Signed)
Talked with patient and wants to know if she can still do procedure. Taking abiotics for cellutis and had covid shot yesterday,. She also stated she is to have an RF today and not PNS. Will confirm with dr Cherylann Ratel. Insurance does not need PA

## 2020-03-25 NOTE — Progress Notes (Signed)
Patient's Name: [name]  MRN: [id number]  Referring Provider: Lauro Regulus, MD  DOB: 02-24-1946  PCP: Lauro Regulus, MD  DOS: 03/25/2020  Note by: Edward Jolly, MD  Service setting: Ambulatory outpatient  Specialty: Interventional Pain Management  Patient type: Established  Location: [facility] (AMB) Pain Management Facility  Visit type: Interventional Procedure   Primary Reason for Visit: Interventional Pain Management Treatment. CC: Back Pain  Procedure:       Anesthesia, Analgesia, Anxiolysis:  Type: Thermal Lumbar Facet, Medial Branch Radiofrequency Ablation/Neurotomy #5 Level:L3, L4, L5,  Medial Branch Level(s). These levels will denervate the L3-4, and the L4-5 lumbar facet joints. Primary Purpose: Therapeutic Region: Posterolateral Lumbosacral Spine Laterality: Right  (#1 done 12/01/2017 left L3, L4, L5 RFA, #2 done 12/14/2018, #3 done 06/21/19, #4 12/25/19)   Type: Moderate (Conscious) Sedation combined with Local Anesthesia Indication(s): Analgesia and Anxiety Route: Intravenous (IV) IV Access: Secured Sedation: Meaningful verbal contact was maintained at all times during the procedure  Local Anesthetic: Lidocaine 1%   Indications: 1. Lumbar facet arthropathy   2. Lumbar spondylosis   3. Lumbar degenerative disc disease    [name] has been dealing with the above chronic pain for longer than three months and has either failed to respond, was unable to tolerate, or simply did not get enough benefit from other more conservative therapies including, but not limited to: 1. Over-the-counter medications 2. Anti-inflammatory medications 3. Muscle relaxants 4. Membrane stabilizers 5. Opioids 6. Physical therapy 7. Modalities (Heat, ice, etc.) 8. Invasive techniques such as nerve blocks. [name] has attained more than 50% relief of the pain from a series of diagnostic injections conducted in separate occasions.  Pain  Score: Pre-procedure: 6 /10 Post-procedure: 6 /10   Patient currently has a left L4 Sprint medial branch peripheral nerve stimulator in place.  Plan was to do right L4 medial branch peripheral nerve stimulation today however patient states that she is not receiving as much benefit with her left L4 Sprint peripheral nerve stimulation as she had hoped.  She would like to do a radiofrequency ablation on the right side.  Insurance coverage has been confirmed.  She has had these before in the past with her most recent one being on 12/25/2019.  Plan to repeat today for the right side.  Pre-op Assessment:  [name] is a 74 y.o. (year old), female patient, seen today for interventional treatment. She  has a past surgical history that includes Ankle reconstruction (Left); Brachioplasty; Cataract extraction; Cholecystectomy; Soft Tissue Tumor Resection; Abdominal hysterectomy (07/12/2014); Joint replacement (Bilateral, 07/2014, 03/2015); nasal reconstrucion; Toe Surgery (Right); Shoulder open rotator cuff repair; Colonoscopy with propofol (N/A, 12/07/2016); and Reduction mammaplasty (Bilateral). [name] has a current medication list which includes the following prescription(s): acetaminophen, albuterol, amantadine, amlodipine, aripiprazole, vitamin c, aspirin ec, azelastine, baclofen, biotin, carvedilol, celecoxib, vitamin d3, donepezil, doxycycline, esomeprazole, fluticasone, folic acid, levothyroxine, losartan, magnesium, melatonin, montelukast, multivitamin, onabotulinumtoxina, potassium chloride, potassium chloride sa, tapentadol, [START ON 04/13/2020] tapentadol, torsemide, trazodone, venlafaxine, and zinc gluconate. Her primarily concern today is the Back Pain  Initial Vital Signs:  Pulse/HCG Rate: 86ECG Heart Rate: 85 (nsr) Temp: 97.9 F (36.6 C) Resp: 17 BP: (!) 156/90 SpO2: 100 %  BMI: Estimated body mass index is 47.94 kg/m as calculated from the following:   Height  as of this encounter: 5\' 6"  (1.676 m).   Weight as of this encounter: 297 lb (134.7 kg).  Risk Assessment: Allergies: Reviewed. She  is allergic to codeine, povidone iodine, hydromorphone, latex, oxycodone-acetaminophen, sulfa antibiotics, bupropion, gabapentin, hydrocodone, morphine and related, and procaine.  Allergy Precautions: None required Coagulopathies: Reviewed. None identified.  Blood-thinner therapy: None at this time Active Infection(s): Reviewed. None identified. [name] is afebrile  Site Confirmation: [name] was asked to confirm the procedure and laterality before marking the site Procedure checklist: Completed Consent: Before the procedure and under the influence of no sedative(s), amnesic(s), or anxiolytics, the patient was informed of the treatment options, risks and possible complications. To fulfill our ethical and legal obligations, as recommended by the American Medical Association's Code of Ethics, I have informed the patient of my clinical impression; the nature and purpose of the treatment or procedure; the risks, benefits, and possible complications of the intervention; the alternatives, including doing nothing; the risk(s) and benefit(s) of the alternative treatment(s) or procedure(s); and the risk(s) and benefit(s) of doing nothing. The patient was provided information about the general risks and possible complications associated with the procedure. These may include, but are not limited to: failure to achieve desired goals, infection, bleeding, organ or nerve damage, allergic reactions, paralysis, and death. In addition, the patient was informed of those risks and complications associated to Spine-related procedures, such as failure to decrease pain; infection (i.e.: Meningitis, epidural or intraspinal abscess); bleeding (i.e.: epidural hematoma, subarachnoid hemorrhage, or any other type of intraspinal or peri-dural bleeding); organ or  nerve damage (i.e.: Any type of peripheral nerve, nerve root, or spinal cord injury) with subsequent damage to sensory, motor, and/or autonomic systems, resulting in permanent pain, numbness, and/or weakness of one or several areas of the body; allergic reactions; (i.e.: anaphylactic reaction); and/or death. Furthermore, the patient was informed of those risks and complications associated with the medications. These include, but are not limited to: allergic reactions (i.e.: anaphylactic or anaphylactoid reaction(s)); adrenal axis suppression; blood sugar elevation that in diabetics may result in ketoacidosis or comma; water retention that in patients with history of congestive heart failure may result in shortness of breath, pulmonary edema, and decompensation with resultant heart failure; weight gain; swelling or edema; medication-induced neural toxicity; particulate matter embolism and blood vessel occlusion with resultant organ, and/or nervous system infarction; and/or aseptic necrosis of one or more joints. Finally, the patient was informed that Medicine is not an exact science; therefore, there is also the possibility of unforeseen or unpredictable risks and/or possible complications that may result in a catastrophic outcome. The patient indicated having understood very clearly. We have given the patient no guarantees and we have made no promises. Enough time was given to the patient to ask questions, all of which were answered to the patient's satisfaction. [name] has indicated that she wanted to continue with the procedure. Attestation: I, the ordering provider, attest that I have discussed with the patient the benefits, risks, side-effects, alternatives, likelihood of achieving goals, and potential problems during recovery for the procedure that I have provided informed consent. Date   Time: 03/25/2020 10:14 AM  Pre-Procedure Preparation:  Monitoring: As per clinic protocol.  Respiration, ETCO2, SpO2, BP, heart rate and rhythm monitor placed and checked for adequate function Safety Precautions: Patient was assessed for positional comfort and pressure points before starting the procedure. Time-out: I initiated and conducted the "Time-out" before starting the procedure, as per protocol. The patient was asked to participate by confirming the accuracy of the "Time Out" information. Verification of the correct person, site, and procedure were performed and confirmed by me, the  nursing staff, and the patient. "Time-out" conducted as per Joint Commission's Universal Protocol (UP.01.01.01). Time: 1230  Description of Procedure:       Position: Prone Laterality: Right Levels:   L3, L4, L5,  Medial Branch Level(s), at the L3-4 and the L4-5 lumbar facet joints. Area Prepped: Lumbosacral Prepping solution: ChloraPrep (2% chlorhexidine gluconate and 70% isopropyl alcohol) Safety Precautions: Aspiration looking for blood return was conducted prior to all injections. At no point did we inject any substances, as a needle was being advanced. Before injecting, the patient was told to immediately notify me if she was experiencing any new onset of "ringing in the ears, or metallic taste in the mouth". No attempts were made at seeking any paresthesias. Safe injection practices and needle disposal techniques used. Medications properly checked for expiration dates. SDV (single dose vial) medications used. After the completion of the procedure, all disposable equipment used was discarded in the proper designated medical waste containers. Local Anesthesia: Protocol guidelines were followed. The patient was positioned over the fluoroscopy table. The area was prepped in the usual manner. The time-out was completed. The target area was identified using fluoroscopy. A 12-in long, straight, sterile hemostat was used with fluoroscopic guidance to locate the targets for each level blocked. Once located, the  skin was marked with an approved surgical skin marker. Once all sites were marked, the skin (epidermis, dermis, and hypodermis), as well as deeper tissues (fat, connective tissue and muscle) were infiltrated with a small amount of a short-acting local anesthetic, loaded on a 10cc syringe with a 25G, 1.5-in  Needle. An appropriate amount of time was allowed for local anesthetics to take effect before proceeding to the next step. Local Anesthetic: Lidocaine 1.0% The unused portion of the local anesthetic was discarded in the proper designated containers. Technical explanation of process:  Radiofrequency Ablation (RFA)  L3 Medial Branch Nerve RFA: The target area for the L3 medial branch is at the junction of the postero-lateral aspect of the superior articular process and the superior, posterior, and medial edge of the transverse process of L4. Under fluoroscopic guidance, a Radiofrequency needle was inserted until contact was made with os over the superior postero-lateral aspect of the pedicular shadow (target area). Sensory and motor testing was conducted to properly adjust the position of the needle. Once satisfactory placement of the needle was achieved, the numbing solution was slowly injected after negative aspiration for blood. 1 mL of the nerve block solution was injected without difficulty or complication. After waiting for at least 3 minutes, the ablation was performed. Once completed, the needle was removed intact. L4 Medial Branch Nerve RFA: The target area for the L4 medial branch is at the junction of the postero-lateral aspect of the superior articular process and the superior, posterior, and medial edge of the transverse process of L5. Under fluoroscopic guidance, a Radiofrequency needle was inserted until contact was made with os over the superior postero-lateral aspect of the pedicular shadow (target area). Sensory and motor testing was conducted to properly adjust the position of the needle.  Once satisfactory placement of the needle was achieved, the numbing solution was slowly injected after negative aspiration for blood. 1mL of the nerve block solution was injected without difficulty or complication. After waiting for at least 3 minutes, the ablation was performed. Once completed, the needle was removed intact. L5 Medial Branch Nerve RFA: The target area for the L5 medial branch is at the junction of the postero-lateral aspect of the superior articular process  of S1 and the superior, posterior, and medial edge of the sacral ala. Under fluoroscopic guidance, a Radiofrequency needle was inserted until contact was made with os over the superior postero-lateral aspect of the pedicular shadow (target area). Sensory and motor testing was conducted to properly adjust the position of the needle. Once satisfactory placement of the needle was achieved, the numbing solution was slowly injected after negative aspiration for blood. 84mL of the nerve block solution was injected without difficulty or complication. After waiting for at least 3 minutes, the ablation was performed. Once completed, the needle was removed intact.  Radiofrequency lesioning (ablation):  Radiofrequency Generator: NeuroTherm [id number] Sensory Stimulation Parameters: 50 Hz was used to locate & identify the nerve, making sure that the needle was positioned such that there was no sensory stimulation below 0.3 V or above 0.7 V. Motor Stimulation Parameters: 2 Hz was used to evaluate the motor component. Care was taken not to lesion any nerves that demonstrated motor stimulation of the lower extremities at an output of less than 2.5 times that of the sensory threshold, or a maximum of 2.0 V. Lesioning Technique Parameters: Standard Radiofrequency settings. (Not bipolar or pulsed.) Temperature Settings: 80 degrees C Lesioning time: 60 seconds Intra-operative Compliance: Compliant Materials & Medications: Needle(s) (Electrode/Cannula) Type:  Teflon-coated, curved tip, Radiofrequency needle(s) Gauge: 22G Length: 10cm Numbing solution: 5 cc solution of 4 cc of 0.2% Ropivacaine and 1 cc of Decadron 10mg /cc, 1-1.5 cc injected at each level above prior to ablation. The unused portion of the solution was discarded in the proper designated containers.  Once the entire procedure was completed, the treated area was cleaned, making sure to leave some of the prepping solution back to take advantage of its long term bactericidal properties.  Illustration of the posterior view of the lumbar spine and the posterior neural structures. Laminae of L2 through S1 are labeled. DPRL5, dorsal primary ramus of L5; DPRS1, dorsal primary ramus of S1; DPR3, dorsal primary ramus of L3; FJ, facet (zygapophyseal) joint L3-L4; I, inferior articular process of L4; LB1, lateral branch of dorsal primary ramus of L1; IAB, inferior articular branches from L3 medial branch (supplies L4-L5 facet joint); IBP, intermediate branch plexus; MB3, medial branch of dorsal primary ramus of L3; NR3, third lumbar nerve root; S, superior articular process of L5; SAB, superior articular branches from L4 (supplies L4-5 facet joint also); TP3, transverse process of L3.  Vitals:   03/25/20 1031 03/25/20 1233 03/25/20 1238 03/25/20 1242  BP: (!) 156/90 (!) 189/95 (!) 194/101 (!) (P) 181/105  Pulse: 86     Resp:  17 16 (P) 15  Temp: 97.9 F (36.6 C)     SpO2: 100% 94% 94% (P) 94%  Weight: 297 lb (134.7 kg)     Height: 5\' 6"  (1.676 m)       Start Time: 1230 hrs. End Time: 1242 hrs.  Imaging Guidance (Spinal):  Type of Imaging Technique: Fluoroscopy Guidance (Spinal) Indication(s): Assistance in needle guidance and placement for procedures requiring needle placement in or near specific anatomical locations not easily accessible without such assistance. Exposure Time: Please see nurses notes. Contrast: None used. Fluoroscopic Guidance: I was personally present during the use of  fluoroscopy. "Tunnel Vision Technique" used to obtain the best possible view of the target area. Parallax error corrected before commencing the procedure. "Direction-depth-direction" technique used to introduce the needle under continuous pulsed fluoroscopy. Once target was reached, antero-posterior, oblique, and lateral fluoroscopic projection used confirm needle placement in all planes. Images  permanently stored in EMR. Interpretation: No contrast injected. I personally interpreted the imaging intraoperatively. Adequate needle placement confirmed in multiple planes. Permanent images saved into the patient's record.  Antibiotic Prophylaxis:   Anti-infectives (From admission, onward)   None     Indication(s): None identified  Post-operative Assessment:  Post-procedure Vital Signs:  Pulse/HCG Rate: 86(P) 84 Temp: 97.9 F (36.6 C) Resp: (P) 15 BP: (!) (P) 181/105 SpO2: (P) 94 %  EBL: None  Complications: No immediate post-treatment complications observed by team, or reported by patient.  Note: The patient tolerated the entire procedure well. A repeat set of vitals were taken after the procedure and the patient was kept under observation following institutional policy, for this type of procedure. Post-procedural neurological assessment was performed, showing return to baseline, prior to discharge. The patient was provided with post-procedure discharge instructions, including a section on how to identify potential problems. Should any problems arise concerning this procedure, the patient was given instructions to immediately contact us, at any time, without hesitation. In any case, we plan to contact the patient by telephone for a follow-up status report regarding this interventional procedure.  Comments:  No additional relevant information. 5 out of 5 strength bilateral lower extremity: Plantar flexion, dorsiflexion, knee flexion, knee extension.  Plan of Care    Imaging Orders     DG  PAIN CLINIC C-ARM 1-60 MIN NO REPORT Procedure Orders    No procedure(s) ordered today     Medications ordered for procedure: Meds ordered this encounter  Medications   lidocaine (XYLOCAINE) 2 % (with pres) injection 400 mg   ropivacaine (PF) 2 mg/mL (0.2%) (NAROPIN) injection 9 mL   dexamethasone (DECADRON) injection 10 mg   diazepam (VALIUM) tablet 5 mg   Medications administered: We administered lidocaine, ropivacaine (PF) 2 mg/mL (0.2%), dexamethasone, and diazepam.  See the medical record for exact dosing, route, and time of administration.  New Prescriptions   No medications on file   Disposition: Discharge home  Discharge Date & Time: 03/25/2020; 1250 hrs.   Physician-requested Follow-up: Return for Keep sch. appt.  Future Appointments  Date Time Provider Department Center  05/01/2020  2:00 PM Edward Jolly, MD [facility] None   Primary Care Physician: Lauro Regulus, MD Location: [facility] Note by: Edward Jolly, MD Date: 03/25/2020; Time: 1:05 PM  Disclaimer:  Medicine is not an exact science. The only guarantee in medicine is that nothing is guaranteed. It is important to note that the decision to proceed with this intervention was based on the information collected from the patient. The Data and conclusions were drawn from the patient's questionnaire, the interview, and the physical examination. Because the information was provided in large part by the patient, it cannot be guaranteed that it has not been purposely or unconsciously manipulated. Every effort has been made to obtain as much relevant data as possible for this evaluation. It is important to note that the conclusions that lead to this procedure are derived in large part from the available data. Always take into account that the treatment will also be dependent on availability of resources and existing treatment guidelines, considered by other Pain Management  Practitioners as being common knowledge and practice, at the time of the intervention. For Medico-Legal purposes, it is also important to point out that variation in procedural techniques and pharmacological choices are the acceptable norm. The indications, contraindications, technique, and results of the above procedure should only be interpreted and judged by a Board-Certified Interventional  Pain Specialist with extensive familiarity and expertise in the same exact procedure and technique.

## 2020-03-25 NOTE — Progress Notes (Signed)
Safety precautions to be maintained throughout the outpatient stay will include: orient to surroundings, keep bed in low position, maintain call bell within reach at all times, provide assistance with transfer out of bed and ambulation.  

## 2020-03-26 ENCOUNTER — Telehealth: Payer: Self-pay | Admitting: *Deleted

## 2020-03-26 NOTE — Telephone Encounter (Signed)
Attempted to call for post procedure follow-up. Message left. 

## 2020-04-09 ENCOUNTER — Ambulatory Visit (INDEPENDENT_AMBULATORY_CARE_PROVIDER_SITE_OTHER): Payer: Medicare Other | Admitting: Nurse Practitioner

## 2020-04-16 ENCOUNTER — Telehealth: Payer: Self-pay | Admitting: Student in an Organized Health Care Education/Training Program

## 2020-04-16 NOTE — Telephone Encounter (Addendum)
Patient lvmail stating her PNS lead has come out on her back. Please call patient

## 2020-04-16 NOTE — Telephone Encounter (Signed)
Was getting good relief, would like to have it re inserted. Please call patient to schedule.

## 2020-04-16 NOTE — Telephone Encounter (Signed)
error 

## 2020-04-16 NOTE — Telephone Encounter (Signed)
Is there anything to do about this?

## 2020-04-16 NOTE — Telephone Encounter (Signed)
Nothing really to do.  If she was experiencing pain relief from it, we can give her the option of reinserting.  If she was not experiencing any pain relief from it, nothing to do.  I would have her contact the [employer] peripheral nerve stimulator representative and let her know as well.

## 2020-04-30 ENCOUNTER — Encounter: Payer: Medicare Other | Admitting: Student in an Organized Health Care Education/Training Program

## 2020-05-01 ENCOUNTER — Ambulatory Visit
Payer: Medicare Other | Attending: Student in an Organized Health Care Education/Training Program | Admitting: Student in an Organized Health Care Education/Training Program

## 2020-05-01 ENCOUNTER — Encounter: Payer: Self-pay | Admitting: Student in an Organized Health Care Education/Training Program

## 2020-05-01 ENCOUNTER — Other Ambulatory Visit: Payer: Self-pay

## 2020-05-01 VITALS — BP 135/67 | HR 74 | Temp 98.7°F | Resp 18 | Wt 297.0 lb

## 2020-05-01 DIAGNOSIS — M47816 Spondylosis without myelopathy or radiculopathy, lumbar region: Secondary | ICD-10-CM | POA: Diagnosis present

## 2020-05-01 DIAGNOSIS — M5136 Other intervertebral disc degeneration, lumbar region: Secondary | ICD-10-CM | POA: Diagnosis not present

## 2020-05-01 DIAGNOSIS — G894 Chronic pain syndrome: Secondary | ICD-10-CM | POA: Diagnosis present

## 2020-05-01 MED ORDER — TAPENTADOL HCL 50 MG PO TABS: 50.0000 mg | ORAL_TABLET | Freq: Every day | ORAL | 0 refills | Status: AC | PRN

## 2020-05-01 NOTE — Progress Notes (Signed)
PROVIDER NOTE: Information contained herein reflects review and annotations entered in association with encounter. Interpretation of such information and data should be left to medically-trained personnel. Information provided to patient can be located elsewhere in the medical record under "Patient Instructions". Document created using STT-dictation technology, any transcriptional errors that may result from process are unintentional.    Patient: [name]  Service Category: E/M  Provider: Edward JollyBilal Roylee Chaffin, MD  DOB: 05/27/1946  DOS: 05/01/2020  Specialty: Interventional Pain Management  MRN: [id number]  Setting: Ambulatory outpatient  PCP: Lauro RegulusAnderson, Marshall W, MD  Type: Established Patient    Referring Provider: Lauro RegulusAnderson, Marshall W, MD  Location: Office  Delivery: Face-to-face     HPI  [name], a 74 y.o. year old female, is here today because of her Lumbar facet arthropathy [M47.816]. [name]'s primary complain today is Back Pain (low and left) and Hip Pain (left) Last encounter: My last encounter with her was on 04/16/2020. Pertinent problems: [name] has Lumbar spondylosis; Lumbar facet arthropathy; Lumbar degenerative disc disease; Spasm of lumbar paraspinous muscle; and Chronic pain syndrome on their pertinent problem list. Pain Assessment: Severity of Chronic pain is reported as a 3 /10. Location: Back Lower, Right/right hip. Onset: More than a month ago. Quality: Constant, Radiating, Aching, Nagging, Pressure. Timing: Constant. Modifying factor(s): medications, heat. Vitals:  weight is 297 lb (134.7 kg). Her oral temperature is 98.7 F (37.1 C). Her blood pressure is 135/67 and her pulse is 74. Her respiration is 18 and oxygen saturation is 97%.   Reason for encounter: medication management.   Patient presents today for medication management.  No significant change in her medical history since her last visit with me.   Patient is status post left L4 lumbar medial branch Sprint peripheral nerve stimulation on 03/13/2020.  Unfortunately, her left L4 lead fracture.  She states that she was noticing some pain relief and continues to do so from the Sprint peripheral nerve stimulation therapy.  She has an appointment next month to have this lead replaced.  Given that she is utilizing less Nucynta, will reduce her monthly quantity to 30.  Keep appointment next month for left L4 Sprint peripheral nerve stimulator lead replacement/insertion for lumbar spondylosis, facet arthropathy.  Pharmacotherapy Assessment   Analgesic: Nucynta 50 mg daily, quantity 30/month.     Monitoring: [place] PMP: PDMP reviewed during this encounter.       Pharmacotherapy: No side-effects or adverse reactions reported. Compliance: No problems identified. Effectiveness: Clinically acceptable.  Rickey BarbaraShatley, Jennifer C, RN  05/01/2020  2:09 PM  Sign when Signing Visit Nursing Pain Medication Assessment:  Safety precautions to be maintained throughout the outpatient stay will include: orient to surroundings, keep bed in low position, maintain call bell within reach at all times, provide assistance with transfer out of bed and ambulation.  Medication Inspection Compliance: Pill count conducted under aseptic conditions, in front of the patient. Neither the pills nor the bottle was removed from the patient's sight at any time. Once count was completed pills were immediately returned to the patient in their original bottle.  Medication: Tapentadol (Nucynta) Pill/Patch Count: 55 of 45 pills remain Pill/Patch Appearance: Markings consistent with prescribed medication Bottle Appearance: Standard pharmacy container. Clearly labeled. Filled Date: 2811 / 13 / 2021 Last Medication intake:  Today    UDS:  Summary  Date Value Ref Range Status  06/22/2019 Note  Final    Comment:    ==================================================================== [employer]  Select 13 (  MW) ==================================================================== Test                             Result       Flag       Units Drug Present and Declared for Prescription Verification   Tapentadol                     4009         EXPECTED   ng/mg creat    Source of tapentadol is a scheduled prescription medication. Drug Absent but Declared for Prescription Verification   Clonazepam                     Not Detected UNEXPECTED ng/mg creat ==================================================================== Test                      Result    Flag   Units      Ref Range   Creatinine              81               mg/dL      >=81 ==================================================================== Declared Medications:  The flagging and interpretation on this report are based on the  following declared medications.  Unexpected results may arise from  inaccuracies in the declared medications.  **Note: The testing scope of this panel includes these medications:  Clonazepam  Tapentadol  **Note: The testing scope of this panel does not include the  following reported medications:  Acetaminophen  Amantadine  Amlodipine  Aspirin  Azelastine  Baclofen  Botulinum (Botox)  Carvedilol  Celecoxib  Cholecalciferol  Donepezil  Esomeprazole  Folic Acid  Levothyroxine  Losartan  Melatonin  Montelukast  Multivitamin  Potassium (Klor-Con)  Prednisone  Torsemide  Trazodone  Turmeric  Venlafaxine  Vitamin B  Vitamin C ==================================================================== For clinical consultation, please call [phone]. ====================================================================      ROS  Constitutional: Denies any fever or chills Gastrointestinal: No reported hemesis, hematochezia, vomiting, or acute GI distress Musculoskeletal: Positive low back pain Neurological: No reported episodes of acute onset apraxia, aphasia, dysarthria,  agnosia, amnesia, paralysis, loss of coordination, or loss of consciousness  Medication Review  ARIPiprazole, Biotin, OnabotulinumtoxinA, Vitamin D3, acetaminophen, albuterol, amLODipine, amantadine, aspirin EC, azelastine, baclofen, carvedilol, celecoxib, donepezil, doxycycline, esomeprazole, fluticasone, folic acid, levothyroxine, losartan, magnesium, melatonin, montelukast, multivitamin, potassium chloride, potassium chloride SA, tapentadol, torsemide, traZODone, venlafaxine, vitamin C, and zinc gluconate  History Review  Allergy: [name] is allergic to codeine, povidone iodine, hydromorphone, latex, oxycodone-acetaminophen, sulfa antibiotics, bupropion, gabapentin, hydrocodone, morphine and related, and procaine. Drug: [name]  reports no history of drug use. Alcohol:  reports current alcohol use of about 2.0 standard drinks of alcohol per week. Tobacco:  reports that she has never smoked. She has never used smokeless tobacco. Social: [name]  reports that she has never smoked. She has never used smokeless tobacco. She reports current alcohol use of about 2.0 standard drinks of alcohol per week. She reports that she does not use drugs. Medical:  has a past medical history of Benign positional vertigo (03/04/2016), Brain injury (HCC), Climacteric, Depression, DJD (degenerative joint disease), Dyslipidemia, GERD (gastroesophageal reflux disease), Hypertension, Hypokalemia, Hypothyroidism, IBS (irritable bowel syndrome), OAB (overactive bladder), Ovarian cyst (03/25/2015), Rhinitis, Sleep apnea, and Spasm of thoracolumbar muscle. Surgical: [name]  has a past surgical history that includes Ankle  reconstruction (Left); Brachioplasty; Cataract extraction; Cholecystectomy; Soft Tissue Tumor Resection; Abdominal hysterectomy (07/12/2014); Joint replacement (Bilateral, 07/2014, 03/2015); nasal reconstrucion; Toe Surgery (Right); Shoulder  open rotator cuff repair; Colonoscopy with propofol (N/A, 12/07/2016); and Reduction mammaplasty (Bilateral). Family: family history includes Breast cancer in her paternal aunt; Lung cancer in her father and mother.  Physical Exam  General appearance: Well nourished, well developed, and well hydrated. In no apparent acute distress Mental status: Alert, oriented x 3 (person, place, & time)       Respiratory: No evidence of acute respiratory distress Eyes: PERLA Vitals: BP 135/67   Pulse 74   Temp 98.7 F (37.1 C) (Oral)   Resp 18   Wt 297 lb (134.7 kg)   SpO2 97%   BMI 47.94 kg/m  BMI: Estimated body mass index is 47.94 kg/m as calculated from the following:   Height as of 03/25/20: 5\' 6"  (1.676 m).   Weight as of this encounter: 297 lb (134.7 kg). Ideal: Ideal body weight: 59.3 kg (130 lb 11.7 oz) Adjusted ideal body weight: 89.5 kg (197 lb 3.8 oz)  Lumbar Exam  Skin & Axial Inspection:No masses, redness, or swelling Alignment:Symmetrical Functional restricted ROMhowever improved after physical therapy  Stability:No instability detected Muscle Tone/Strength:Functionally intact. No obvious neuro-muscular anomalies detected. Sensory (Neurological):Musculoskeletal pain pattern Palpation:No palpable anomalies Provocative Tests: Hyperextension/rotation test:Positivebilaterally for facet joint pain.,  Improved after Sprint PNS trial and RFA. Lumbar quadrant test (Kemp's test):(+)bilaterally for facet joint pain. Lateral bending test:deferred today Patrick's Maneuver:deferred today FABER* test:deferred today S-I anterior distraction/compression test:deferred today S-I lateral compression test:deferred today S-I Thigh-thrust test:deferred today S-I Gaenslen's test:deferred today *(Flexion, ABduction and External Rotation)  Gait & Posture Assessment  Ambulation:Patient  ambulates using a cane Gait:Limited. Using assistive device to ambulate Posture:Difficulty standing up straight, due to pain  Lower Extremity Exam    Side:Right lower extremity  Side:Left lower extremity  Stability:No instability observed  Stability:No instability observed  Skin & Extremity Inspection:Skin color, temperature, and hair growth are WNL. No peripheral edema or cyanosis. No masses, redness, swelling, asymmetry, or associated skin lesions. No contractures.  Skin & Extremity Inspection:Skin color, temperature, and hair growth are WNL. No peripheral edema or cyanosis. No masses, redness, swelling, asymmetry, or associated skin lesions. No contractures.  Functional SWN:IOEV restricted ROMfor hip and knee joints   Functional OJJ:KKXF restricted ROMfor hip and knee joints   Muscle Tone/Strength:Functionally intact. No obvious neuro-muscular anomalies detected.  Muscle Tone/Strength:Functionally intact. No obvious neuro-muscular anomalies detected.  Sensory (Neurological):Unimpaired  Sensory (Neurological):Unimpaired  DTR: Patellar:deferred today Achilles:deferred today Plantar:deferred today  DTR: Patellar:deferred today Achilles:deferred today Plantar:deferred today  Palpation:No palpable anomalies  Palpation:No palpable anomalies     Assessment   Status Diagnosis  Controlled Controlled Controlled 1. Lumbar facet arthropathy   2. Chronic pain syndrome   3. Lumbar spondylosis   4. Lumbar degenerative disc disease       Plan of Care   Ms. Jameeka J Hollifield [name] has a current medication list which includes the following long-term medication(s): amantadine, amlodipine, azelastine, carvedilol, donepezil, esomeprazole, levothyroxine, losartan, montelukast, potassium chloride sa, torsemide, trazodone, venlafaxine, albuterol, aripiprazole, and potassium chloride.  Pharmacotherapy (Medications  Ordered): Meds ordered this encounter  Medications  . tapentadol (NUCYNTA) 50 MG tablet    Sig: Take 1 tablet (50 mg total) by mouth daily as needed for moderate pain or severe pain. For chronic pain syndrome    Dispense:  30 tablet    Refill:  0    [place] STOP  ACT - Not applicable. Fill one day early if pharmacy is closed on scheduled refill date.  . tapentadol (NUCYNTA) 50 MG tablet    Sig: Take 1 tablet (50 mg total) by mouth daily as needed for moderate pain or severe pain. For chronic pain syndrome    Dispense:  30 tablet    Refill:  0    [place] STOP ACT - Not applicable. Fill one day early if pharmacy is closed on scheduled refill date.  . tapentadol (NUCYNTA) 50 MG tablet    Sig: Take 1 tablet (50 mg total) by mouth daily as needed for moderate pain or severe pain. For chronic pain syndrome    Dispense:  30 tablet    Refill:  0    [place] STOP ACT - Not applicable. Fill one day early if pharmacy is closed on scheduled refill date.   Follow-up plan:   Return in about 3 months (around 08/01/2020) for Medication Management, in person.     Status post left L3, L4, L5 RFA 12/01/2017, 12/14/2018, 06/21/2019.  Status post right L3, L4, L5 RFA on 12/22/2017, 12/28/2018, 06/21/2019, 07/10/2019 helps decrease her pain and improve her functional status for axial low back for approximately 6 to 8 months post RFA.  Can repeat every 6 to 12 months.  Left L4 Sprint peripheral nerve stimulation 03/13/2020.      Recent Visits Date Type Provider Dept  03/25/20 Procedure visit Edward Jolly, MD [facility]  03/13/20 Procedure visit Edward Jolly, MD [facility]  02/13/20 Office Visit Edward Jolly, MD [facility]  Showing recent visits within past 90 days and meeting all other requirements Today's Visits Date Type Provider Dept  05/01/20 Office Visit Edward Jolly, MD [facility]  Showing today's visits and meeting all other requirements Future Appointments Date Type Provider Dept   05/15/20 Appointment Edward Jolly, MD [facility]  Showing future appointments within next 90 days and meeting all other requirements  I discussed the assessment and treatment plan with the patient. The patient was provided an opportunity to ask questions and all were answered. The patient agreed with the plan and demonstrated an understanding of the instructions.  Patient advised to call back or seek an in-person evaluation if the symptoms or condition worsens.  Duration of encounter: 30 minutes.  Note by: Edward Jolly, MD Date: 05/01/2020; Time: 2:34 PM

## 2020-05-01 NOTE — Progress Notes (Signed)
Nursing Pain Medication Assessment:  Safety precautions to be maintained throughout the outpatient stay will include: orient to surroundings, keep bed in low position, maintain call bell within reach at all times, provide assistance with transfer out of bed and ambulation.  Medication Inspection Compliance: Pill count conducted under aseptic conditions, in front of the patient. Neither the pills nor the bottle was removed from the patient's sight at any time. Once count was completed pills were immediately returned to the patient in their original bottle.  Medication: Tapentadol (Nucynta) Pill/Patch Count: 55 of 45 pills remain Pill/Patch Appearance: Markings consistent with prescribed medication Bottle Appearance: Standard pharmacy container. Clearly labeled. Filled Date: 11 / 13 / 2021 Last Medication intake:  Today

## 2020-05-15 ENCOUNTER — Other Ambulatory Visit: Payer: Self-pay

## 2020-05-15 ENCOUNTER — Ambulatory Visit
Admission: RE | Admit: 2020-05-15 | Discharge: 2020-05-15 | Disposition: A | Discharge status: Home / Self Care | Payer: Medicare Other | Source: Ambulatory Visit | Attending: Student in an Organized Health Care Education/Training Program | Admitting: Student in an Organized Health Care Education/Training Program

## 2020-05-15 ENCOUNTER — Encounter: Payer: Self-pay | Admitting: Student in an Organized Health Care Education/Training Program

## 2020-05-15 ENCOUNTER — Ambulatory Visit (HOSPITAL_BASED_OUTPATIENT_CLINIC_OR_DEPARTMENT_OTHER): Payer: Medicare Other | Admitting: Student in an Organized Health Care Education/Training Program

## 2020-05-15 VITALS — BP 181/95 | HR 72 | Temp 97.2°F | Resp 15 | Ht 66.0 in | Wt 297.0 lb

## 2020-05-15 DIAGNOSIS — M47816 Spondylosis without myelopathy or radiculopathy, lumbar region: Secondary | ICD-10-CM | POA: Insufficient documentation

## 2020-05-15 MED ORDER — LIDOCAINE HCL 2 % IJ SOLN
20.0000 mL | Freq: Once | INTRAMUSCULAR | Status: AC
  Administered 2020-05-15: 400 mg

## 2020-05-15 MED ORDER — LIDOCAINE HCL 2 % IJ SOLN
INTRAMUSCULAR | Status: AC
  Filled 2020-05-15: qty 20

## 2020-05-15 MED ORDER — ROPIVACAINE HCL 2 MG/ML IJ SOLN
9.0000 mL | Freq: Once | INTRAMUSCULAR | Status: AC
  Administered 2020-05-15: 5 mL via PERINEURAL

## 2020-05-15 MED ORDER — ROPIVACAINE HCL 2 MG/ML IJ SOLN
INTRAMUSCULAR | Status: AC
  Filled 2020-05-15: qty 10

## 2020-05-15 NOTE — Progress Notes (Signed)
PROVIDER NOTE: Information contained herein reflects review and annotations entered in association with encounter. Interpretation of such information and data should be left to medically-trained personnel. Information provided to patient can be located elsewhere in the medical record under "Patient Instructions". Document created using STT-dictation technology, any transcriptional errors that may result from process are unintentional.    Patient: [name]  Service Category: Procedure  Provider: Edward Jolly, MD  DOB: [birth year]  DOS: 05/15/2020  Location: [facility]  MRN: [id number]  Setting: Ambulatory - outpatient  Referring Provider: Lauro Regulus, MD  Type: Established Patient  Specialty: Interventional Pain Management  PCP: Lauro Regulus, MD   Primary Reason for Visit: Interventional Pain Management Treatment. CC: Back Pain  Procedure:          Anesthesia, Analgesia, Anxiolysis:  Left L4 lumbar medial branch peripheral nerve stimulation, Sprint for low back pain related to lumbar facet arthropathy, lumbar spondylosis (reinsertion as pt's previous lead came out)  Type: Local Anesthesia   Local Anesthetic: Lidocaine 1-2%  Position: Prone   Indications: 1. Lumbar facet arthropathy   2. Lumbar spondylosis    Pain Score: Pre-procedure: 3 /10 Post-procedure: 0-No pain/10   Patient presents for replacement of her left L4 lumbar medial branch Sprint peripheral nerve stimulator due to lead coming out.   Pre-op Assessment:  [name] is a 74 y.o. (year old), female patient, seen today for interventional treatment. She  has a past surgical history that includes Ankle reconstruction (Left); Brachioplasty; Cataract extraction; Cholecystectomy; Soft Tissue Tumor Resection; Abdominal hysterectomy (07/12/2014); Joint replacement (Bilateral, 07/2014, 03/2015); nasal reconstrucion; Toe Surgery (Right); Shoulder open rotator cuff repair;  Colonoscopy with propofol (N/A, 12/07/2016); and Reduction mammaplasty (Bilateral). [name] has a current medication list which includes the following prescription(s): acetaminophen, albuterol, amantadine, amlodipine, aripiprazole, vitamin c, aspirin ec, azelastine, baclofen, biotin, carvedilol, celecoxib, vitamin d3, donepezil, esomeprazole, fluticasone, folic acid, levothyroxine, losartan, magnesium, melatonin, montelukast, multivitamin, onabotulinumtoxina, potassium chloride sa, [START ON 05/26/2020] tapentadol, [START ON 06/25/2020] tapentadol, [START ON 07/25/2020] tapentadol, torsemide, trazodone, venlafaxine, zinc gluconate, doxycycline, and potassium chloride. Her primarily concern today is the Back Pain  Initial Vital Signs:  Pulse/HCG Rate: 78  Temp: (!) 97.2 F (36.2 C) Resp: 16 BP: (!) 153/75 SpO2: 100 %  BMI: Estimated body mass index is 47.94 kg/m as calculated from the following:   Height as of this encounter: 5\' 6"  (1.676 m).   Weight as of this encounter: 297 lb (134.7 kg).  Risk Assessment: Allergies: Reviewed. She is allergic to codeine, povidone iodine, hydromorphone, latex, oxycodone-acetaminophen, sulfa antibiotics, bupropion, gabapentin, hydrocodone, morphine and related, and procaine.  Allergy Precautions: None required Coagulopathies: Reviewed. None identified.  Blood-thinner therapy: None at this time Active Infection(s): Reviewed. None identified. Ms. [name] is afebrile  Site Confirmation: [name] was asked to confirm the procedure and laterality before marking the site Procedure checklist: Completed Consent: Before the procedure and under the influence of no sedative(s), amnesic(s), or anxiolytics, the patient was informed of the treatment options, risks and possible complications. To fulfill our ethical and legal obligations, as recommended by the American Medical Association's Code of Ethics, I have informed the  patient of my clinical impression; the nature and purpose of the treatment or procedure; the risks, benefits, and possible complications of the intervention; the alternatives, including doing nothing; the risk(s) and benefit(s) of the alternative treatment(s) or procedure(s); and the risk(s) and benefit(s) of doing nothing. The patient was provided information  about the general risks and possible complications associated with the procedure. These may include, but are not limited to: failure to achieve desired goals, infection, bleeding, organ or nerve damage, allergic reactions, paralysis, and death. In addition, the patient was informed of those risks and complications associated to Spine-related procedures, such as failure to decrease pain; infection (i.e.: Meningitis, epidural or intraspinal abscess); bleeding (i.e.: epidural hematoma, subarachnoid hemorrhage, or any other type of intraspinal or peri-dural bleeding); organ or nerve damage (i.e.: Any type of peripheral nerve, nerve root, or spinal cord injury) with subsequent damage to sensory, motor, and/or autonomic systems, resulting in permanent pain, numbness, and/or weakness of one or several areas of the body; allergic reactions; (i.e.: anaphylactic reaction); and/or death. Furthermore, the patient was informed of those risks and complications associated with the medications. These include, but are not limited to: allergic reactions (i.e.: anaphylactic or anaphylactoid reaction(s)); adrenal axis suppression; blood sugar elevation that in diabetics may result in ketoacidosis or comma; water retention that in patients with history of congestive heart failure may result in shortness of breath, pulmonary edema, and decompensation with resultant heart failure; weight gain; swelling or edema; medication-induced neural toxicity; particulate matter embolism and blood vessel occlusion with resultant organ, and/or nervous system infarction; and/or aseptic necrosis  of one or more joints. Finally, the patient was informed that Medicine is not an exact science; therefore, there is also the possibility of unforeseen or unpredictable risks and/or possible complications that may result in a catastrophic outcome. The patient indicated having understood very clearly. We have given the patient no guarantees and we have made no promises. Enough time was given to the patient to ask questions, all of which were answered to the patient's satisfaction. [name] has indicated that she wanted to continue with the procedure. Attestation: I, the ordering provider, attest that I have discussed with the patient the benefits, risks, side-effects, alternatives, likelihood of achieving goals, and potential problems during recovery for the procedure that I have provided informed consent. Date  Time: 05/15/2020 10:31 AM  Pre-Procedure Preparation:  Monitoring: As per clinic protocol. Respiration, ETCO2, SpO2, BP, heart rate and rhythm monitor placed and checked for adequate function Safety Precautions: Patient was assessed for positional comfort and pressure points before starting the procedure. Time-out: I initiated and conducted the "Time-out" before starting the procedure, as per protocol. The patient was asked to participate by confirming the accuracy of the "Time Out" information. Verification of the correct person, site, and procedure were performed and confirmed by me, the nursing staff, and the patient. "Time-out" conducted as per Joint Commission's Universal Protocol (UP.01.01.01). Time: 1110  Description of Procedure:          Left L4 medial branch peripheral nerve stimulation with sprint  After the risks, benefits and alternatives were discussed  with the patient and informed consent was obtained, patient  was placed in the prone position and padded to foster comfort.  Prior to delivery of anesthetics, the painful region was carefully  outlined with a marker.  Appropriate skin and bony landmarks  were identified, and pertinent vascular structures were located.  The skin overlying the lumbosacral spine was prepped and  draped in sterile fashion. Fluoroscopy was used to identify the spinous process and lamina in the center of the  patient's region of pain. After identifying and marking the intended target along the course of the medial branch nerve,  the skin around the planned entry point and the subcutaneous tissues are injected with local  anesthetic.  An introducer needle and stimulating probe were  assembled, inserted and advanced along the intended course of the medial branch nerve as it traverses the lamina medial and inferior to the zygapophyseal joint, taking care to maintain the proper depth of insertion as the introducer is advanced under fluoroscopic. The introducer needle was delivered to a location in proximity to the nerve.  Multiple stimulation parameters were used to deliver stimulation to the medial branch nerve in concert with stimulating at multiple positions around the nerve. Nerve target  acquisition was confirmed noting generation of paresthesias in the paravertebral regions corresponding to the level being stimulated as well as rhythmic thumping within the multifidi, the  latter being further corroborated via palpation.   Various electrical parameter combinations were tested, and the lead location was adjusted (physically relocated) until the patient  indicated paresthesia or muscle tension overlapping the distribution of the patient's typical region of pain.   The stimulating probe was removed from the introducer and a percutaneous lead was guided through the needle and delivered to a location in similar proximity to the nerve. Final  location was verified with electrical stimulation and documented with fluoroscopy & ultrasound.   The introducer needle was removed, and the exposed end of the percutaneous lead was attached to an  external stimulator unit. Various electrical parameter combinations were again  tested until the patient indicated paresthesia or muscle tension  overlapping the distribution of the patient's typical region of pain.   After confirming that lead impedance was in the normal range, the external unit was detached, the needle was removed, and the lead was anchored at the skin.  The lead was threaded into the connector block and electrical continuity and desired patient response was confirmed. The connector block was attached to the external  stimulator unit.The site was covered with a sterile occlusive dressing and  a fluoroscopic and ultrasound image was taken to document final placement. The patient was observed for stability of vital signs and comfort.     Illustration of the posterior view of the lumbar spine and the posterior neural structures. Laminae of L2 through S1 are labeled. DPRL5, dorsal primary ramus of L5; DPRS1, dorsal primary ramus of S1; DPR3, dorsal primary ramus of L3; FJ, facet (zygapophyseal) joint L3-L4; I, inferior articular process of L4; LB1, lateral branch of dorsal primary ramus of L1; IAB, inferior articular branches from L3 medial branch (supplies L4-L5 facet joint); IBP, intermediate branch plexus; MB3, medial branch of dorsal primary ramus of L3; NR3, third lumbar nerve root; S, superior articular process of L5; SAB, superior articular branches from L4 (supplies L4-5 facet joint also); TP3, transverse process of L3.  Vitals:   05/15/20 1036 05/15/20 1100 05/15/20 1110 05/15/20 1115  BP: (!) 153/75 (!) 193/99 (!) 182/86 (!) 181/95  Pulse: 78 77 76 72  Resp: Temp: (!) 97.2 F (36.2 C)     TempSrc: Temporal     SpO2: 100% 97% 98% 99%  Weight: 297 lb (134.7 kg)     Height:  (1.676 m)        Start Time: 1110 hrs. End Time: 1115 hrs.  Imaging Guidance (Spinal):          Type of Imaging Technique: Fluoroscopy Guidance (Spinal)  & ultrasound guidance  to confirm multifidus activation after placement of left L4 medial branch lead. Indication(s): Assistance in needle guidance and placement for procedures requiring needle placement in or near specific anatomical locations not  easily accessible without such assistance. Exposure Time: Please see nurses notes. Contrast: None used. Fluoroscopic Guidance: I was personally present during the use of fluoroscopy. "Tunnel Vision Technique" used to obtain the best possible view of the target area. Parallax error corrected before commencing the procedure. "Direction-depth-direction" technique used to introduce the needle under continuous pulsed fluoroscopy. Once target was reached, antero-posterior, oblique, and lateral fluoroscopic projection used confirm needle placement in all planes. Images permanently stored in EMR. Interpretation: No contrast injected. I personally interpreted the imaging intraoperatively. Adequate needle placement confirmed in multiple planes. Permanent images saved into the patient's record.  Antibiotic Prophylaxis:   Anti-infectives (From admission, onward)   None     Indication(s): None identified  Post-operative Assessment:  Post-procedure Vital Signs:  Pulse/HCG Rate: 72  Temp: (!) 97.2 F (36.2 C) Resp: 15 BP: (!) 181/95 SpO2: 99 %  EBL: None  Complications: No immediate post-treatment complications observed by team, or reported by patient.  Note: The patient tolerated the entire procedure well. A repeat set of vitals were taken after the procedure and the patient was kept under observation following institutional policy, for this type of procedure. Post-procedural neurological assessment was performed, showing return to baseline, prior to discharge. The patient was provided with post-procedure discharge instructions, including a section on how to identify potential problems. Should any problems arise concerning this procedure, the patient was given instructions to  immediately contact us, at any time, without hesitation. In any case, we plan to contact the patient by telephone for a follow-up status report regarding this interventional procedure.  Comments:  No additional relevant information.  Plan of Care  Orders:  Orders Placed This Encounter  Procedures  . DG PAIN CLINIC C-ARM 1-60 MIN NO REPORT    Intraoperative interpretation by procedural physician at [facility].    Standing Status:   Standing    Number of Occurrences:   1    Order Specific Question:   Reason for exam:    Answer:   Assistance in needle guidance and placement for procedures requiring needle placement in or near specific anatomical locations not easily accessible without such assistance.    Medications ordered for procedure: Meds ordered this encounter  Medications  . lidocaine (XYLOCAINE) 2 % (with pres) injection 400 mg  . ropivacaine (PF) 2 mg/mL (0.2%) (NAROPIN) injection 9 mL   Medications administered: We administered lidocaine and ropivacaine (PF) 2 mg/mL (0.2%).  See the medical record for exact dosing, route, and time of administration.  Follow-up plan:   Return in about 8 weeks (around 07/10/2020) for PNS lead removal.      Status post left L3, L4, L5 RFA 12/01/2017, 12/14/2018, 06/21/2019.  Status post right L3, L4, L5 RFA on 12/22/2017, 12/28/2018, 06/21/2019, 07/10/2019 helps decrease her pain and improve her functional status for axial low back for approximately 6 to 8 months post RFA.  Can repeat every 6 to 12 months.  Left L4 Sprint peripheral nerve stimulation 03/13/2020, lead displacement/migration, lead removed and left L4 medial branch nerve PNS replacement on 05/15/2020.     Recent Visits Date Type Provider Dept  05/01/20 Office Visit Edward Jolly, MD [facility]  03/25/20 Procedure visit Edward Jolly, MD [facility]  03/13/20 Procedure visit Edward Jolly, MD [facility]  Showing recent visits within past 90 days and  meeting all other requirements Today's Visits Date Type Provider Dept  05/15/20 Procedure visit Edward Jolly, MD [facility]  Showing today's visits and meeting  all other requirements Future Appointments Date Type Provider Dept  07/10/20 Appointment Edward JollyLateef, Moritz Lever, MD [facility]  08/01/20 Appointment Edward JollyLateef, Graycee Greeson, MD [facility]  Showing future appointments within next 90 days and meeting all other requirements  Disposition: Discharge home  Discharge (Date  Time): 05/15/2020; 1141 hrs.   Primary Care Physician: Lauro RegulusAnderson, Marshall W, MD [facility] Note by: Edward JollyBilal Kadie Balestrieri, MD Date: 05/15/2020; Time: 12:57 PM  Disclaimer:  Medicine is not an exact science. The only guarantee in medicine is that nothing is guaranteed. It is important to note that the decision to proceed with this intervention was based on the information collected from the patient. The Data and conclusions were drawn from the patient's questionnaire, the interview, and the physical examination. Because the information was provided in large part by the patient, it cannot be guaranteed that it has not been purposely or unconsciously manipulated. Every effort has been made to obtain as much relevant data as possible for this evaluation. It is important to note that the conclusions that lead to this procedure are derived in large part from the available data. Always take into account that the treatment will also be dependent on availability of resources and existing treatment guidelines, considered by other Pain Management Practitioners as being common knowledge and practice, at the time of the intervention. For Medico-Legal purposes, it is also important to point out that variation in procedural techniques and pharmacological choices are the acceptable norm. The indications, contraindications, technique, and results of the above procedure should only be interpreted and  judged by a Board-Certified Interventional Pain Specialist with extensive familiarity and expertise in the same exact procedure and technique.

## 2020-05-15 NOTE — Progress Notes (Signed)
Safety precautions to be maintained throughout the outpatient stay will include: orient to surroundings, keep bed in low position, maintain call bell within reach at all times, provide assistance with transfer out of bed and ambulation.  

## 2020-05-16 ENCOUNTER — Telehealth: Payer: Self-pay

## 2020-05-16 NOTE — Telephone Encounter (Signed)
Pt was called and no problems reported. 

## 2020-06-18 ENCOUNTER — Telehealth: Payer: Self-pay

## 2020-06-18 NOTE — Telephone Encounter (Signed)
Attempted to call patient for insurance card information.  Unable to reach.  Needs a PA for Nucynta and the numbers that are put in are not finding the patient.  When she calls back, please complete a PA for the nucynta.

## 2020-06-19 ENCOUNTER — Telehealth: Payer: Self-pay | Admitting: Student in an Organized Health Care Education/Training Program

## 2020-06-19 NOTE — Telephone Encounter (Signed)
Patient lvmail stating the leads in her Sprint device have come out, been out 2 weeks but she didn't want to call in with the holidays. She would like to get this put back in. What needs to be done.

## 2020-06-19 NOTE — Telephone Encounter (Signed)
Please advise 

## 2020-06-20 NOTE — Telephone Encounter (Signed)
We should confirm whether it came out or if it fractured. If it came out we can replace it. Let me know if I need to put in order for that. Thanks

## 2020-06-20 NOTE — Telephone Encounter (Signed)
Patient states she only had one lead to come out which appears intact and not fractured. OK per Dr. Cherylann Ratel to schedule. Please order for Left L4 PNS and get ins approval. Order has been placed. Thank you.

## 2020-06-24 ENCOUNTER — Telehealth: Payer: Self-pay

## 2020-06-24 NOTE — Telephone Encounter (Signed)
[facility] [address] is saying they dont have the nucenta prescription.

## 2020-06-25 NOTE — Telephone Encounter (Signed)
JS did PA

## 2020-06-25 NOTE — Telephone Encounter (Signed)
I worked on this for over 30 minutes on 06/23/2020. I will attempt to call patient and clarify insurance info. today 06/25/2020.

## 2020-07-02 ENCOUNTER — Emergency Department: Payer: Medicare Other

## 2020-07-02 ENCOUNTER — Other Ambulatory Visit: Payer: Self-pay

## 2020-07-02 ENCOUNTER — Inpatient Hospital Stay
Admission: EM | Admit: 2020-07-02 | Discharge: 2020-07-06 | DRG: 871 | Disposition: A | Discharge status: Home Health | Payer: Medicare Other | Attending: Student | Admitting: Student

## 2020-07-02 ENCOUNTER — Encounter: Payer: Self-pay | Admitting: Emergency Medicine

## 2020-07-02 DIAGNOSIS — A419 Sepsis, unspecified organism: Principal | ICD-10-CM | POA: Diagnosis present

## 2020-07-02 DIAGNOSIS — R4189 Other symptoms and signs involving cognitive functions and awareness: Secondary | ICD-10-CM | POA: Diagnosis present

## 2020-07-02 DIAGNOSIS — Z7982 Long term (current) use of aspirin: Secondary | ICD-10-CM

## 2020-07-02 DIAGNOSIS — Z884 Allergy status to anesthetic agent status: Secondary | ICD-10-CM

## 2020-07-02 DIAGNOSIS — G4733 Obstructive sleep apnea (adult) (pediatric): Secondary | ICD-10-CM

## 2020-07-02 DIAGNOSIS — M47812 Spondylosis without myelopathy or radiculopathy, cervical region: Secondary | ICD-10-CM | POA: Diagnosis present

## 2020-07-02 DIAGNOSIS — I110 Hypertensive heart disease with heart failure: Secondary | ICD-10-CM | POA: Diagnosis present

## 2020-07-02 DIAGNOSIS — R0902 Hypoxemia: Secondary | ICD-10-CM | POA: Diagnosis not present

## 2020-07-02 DIAGNOSIS — E039 Hypothyroidism, unspecified: Secondary | ICD-10-CM | POA: Diagnosis present

## 2020-07-02 DIAGNOSIS — Z79899 Other long term (current) drug therapy: Secondary | ICD-10-CM

## 2020-07-02 DIAGNOSIS — Z6841 Body Mass Index (BMI) 40.0 and over, adult: Secondary | ICD-10-CM

## 2020-07-02 DIAGNOSIS — Z91040 Latex allergy status: Secondary | ICD-10-CM

## 2020-07-02 DIAGNOSIS — E876 Hypokalemia: Secondary | ICD-10-CM | POA: Diagnosis present

## 2020-07-02 DIAGNOSIS — Z803 Family history of malignant neoplasm of breast: Secondary | ICD-10-CM

## 2020-07-02 DIAGNOSIS — Z20822 Contact with and (suspected) exposure to covid-19: Secondary | ICD-10-CM | POA: Diagnosis present

## 2020-07-02 DIAGNOSIS — J189 Pneumonia, unspecified organism: Secondary | ICD-10-CM | POA: Diagnosis not present

## 2020-07-02 DIAGNOSIS — E785 Hyperlipidemia, unspecified: Secondary | ICD-10-CM | POA: Diagnosis present

## 2020-07-02 DIAGNOSIS — Z801 Family history of malignant neoplasm of trachea, bronchus and lung: Secondary | ICD-10-CM

## 2020-07-02 DIAGNOSIS — I10 Essential (primary) hypertension: Secondary | ICD-10-CM | POA: Diagnosis present

## 2020-07-02 DIAGNOSIS — K219 Gastro-esophageal reflux disease without esophagitis: Secondary | ICD-10-CM | POA: Diagnosis present

## 2020-07-02 DIAGNOSIS — Z885 Allergy status to narcotic agent status: Secondary | ICD-10-CM

## 2020-07-02 DIAGNOSIS — M47816 Spondylosis without myelopathy or radiculopathy, lumbar region: Secondary | ICD-10-CM | POA: Diagnosis present

## 2020-07-02 DIAGNOSIS — Z87828 Personal history of other (healed) physical injury and trauma: Secondary | ICD-10-CM

## 2020-07-02 DIAGNOSIS — Z79890 Hormone replacement therapy (postmenopausal): Secondary | ICD-10-CM

## 2020-07-02 DIAGNOSIS — G894 Chronic pain syndrome: Secondary | ICD-10-CM | POA: Diagnosis present

## 2020-07-02 DIAGNOSIS — I5032 Chronic diastolic (congestive) heart failure: Secondary | ICD-10-CM | POA: Diagnosis present

## 2020-07-02 DIAGNOSIS — Z882 Allergy status to sulfonamides status: Secondary | ICD-10-CM

## 2020-07-02 LAB — CBC
HCT: 39 % (ref 36.0–46.0)
Hemoglobin: 12.9 g/dL (ref 12.0–15.0)
MCH: 31.3 pg (ref 26.0–34.0)
MCHC: 33.1 g/dL (ref 30.0–36.0)
MCV: 94.7 fL (ref 80.0–100.0)
Platelets: 198 10*3/uL (ref 150–400)
RBC: 4.12 MIL/uL (ref 3.87–5.11)
RDW: 13.9 % (ref 11.5–15.5)
WBC: 13.1 10*3/uL — ABNORMAL HIGH (ref 4.0–10.5)
nRBC: 0.2 % (ref 0.0–0.2)

## 2020-07-02 LAB — RESP PANEL BY RT-PCR (FLU A&B, COVID) ARPGX2
Influenza A by PCR: NEGATIVE
Influenza B by PCR: NEGATIVE
SARS Coronavirus 2 by RT PCR: NEGATIVE

## 2020-07-02 LAB — COMPREHENSIVE METABOLIC PANEL
ALT: 21 U/L (ref 0–44)
AST: 25 U/L (ref 15–41)
Albumin: 3.7 g/dL (ref 3.5–5.0)
Alkaline Phosphatase: 80 U/L (ref 38–126)
Anion gap: 13 (ref 5–15)
BUN: 6 mg/dL — ABNORMAL LOW (ref 8–23)
CO2: 25 mmol/L (ref 22–32)
Calcium: 9.7 mg/dL (ref 8.9–10.3)
Chloride: 103 mmol/L (ref 98–111)
Creatinine, Ser: 0.61 mg/dL (ref 0.44–1.00)
GFR, Estimated: >60 mL/min (ref >60)
Glucose, Bld: 122 mg/dL — ABNORMAL HIGH (ref 70–99)
Potassium: 2.9 mmol/L — ABNORMAL LOW (ref 3.5–5.1)
Sodium: 141 mmol/L (ref 135–145)
Total Bilirubin: 0.9 mg/dL (ref 0.3–1.2)
Total Protein: 7.7 g/dL (ref 6.5–8.1)

## 2020-07-02 LAB — TROPONIN I (HIGH SENSITIVITY): Troponin I (High Sensitivity): 7 ng/L (ref <18)

## 2020-07-02 LAB — LACTIC ACID, PLASMA: Lactic Acid, Venous: 1.1 mmol/L (ref 0.5–1.9)

## 2020-07-02 LAB — BRAIN NATRIURETIC PEPTIDE: B Natriuretic Peptide: 54.7 pg/mL (ref 0.0–100.0)

## 2020-07-02 MED ORDER — SODIUM CHLORIDE 0.9 % IV SOLN
500.0000 mg | Freq: Once | INTRAVENOUS | Status: AC
  Administered 2020-07-02: 500 mg via INTRAVENOUS
  Filled 2020-07-02: qty 500

## 2020-07-02 MED ORDER — AZITHROMYCIN 500 MG IV SOLR
500.0000 mg | INTRAVENOUS | Status: DC
  Administered 2020-07-03 – 2020-07-05 (×3): 500 mg via INTRAVENOUS
  Filled 2020-07-02 (×4): qty 500

## 2020-07-02 MED ORDER — SODIUM CHLORIDE 0.9 % IV SOLN
2.0000 g | Freq: Once | INTRAVENOUS | Status: AC
  Administered 2020-07-02: 2 g via INTRAVENOUS
  Filled 2020-07-02: qty 20

## 2020-07-02 MED ORDER — POTASSIUM CHLORIDE 20 MEQ PO PACK
40.0000 meq | PACK | Freq: Every day | ORAL | Status: DC
  Administered 2020-07-02 – 2020-07-03 (×2): 40 meq via ORAL
  Filled 2020-07-02 (×2): qty 2

## 2020-07-02 MED ORDER — ENOXAPARIN SODIUM 80 MG/0.8ML ~~LOC~~ SOLN
70.0000 mg | SUBCUTANEOUS | Status: DC
  Administered 2020-07-03 – 2020-07-06 (×4): 70 mg via SUBCUTANEOUS
  Filled 2020-07-02 (×4): qty 0.8

## 2020-07-02 MED ORDER — TAPENTADOL HCL 50 MG PO TABS
50.0000 mg | ORAL_TABLET | ORAL | Status: AC
  Administered 2020-07-02: 50 mg via ORAL
  Filled 2020-07-02: qty 1

## 2020-07-02 MED ORDER — AZITHROMYCIN 250 MG PO TABS: ORAL_TABLET | ORAL | 0 refills | Status: DC

## 2020-07-02 MED ORDER — CEPHALEXIN 500 MG PO CAPS: 500.0000 mg | ORAL_CAPSULE | Freq: Three times a day (TID) | ORAL | 0 refills | Status: DC

## 2020-07-02 MED ORDER — SODIUM CHLORIDE 0.9 % IV SOLN
2.0000 g | INTRAVENOUS | Status: DC
  Administered 2020-07-03 – 2020-07-05 (×3): 2 g via INTRAVENOUS
  Filled 2020-07-02: qty 2
  Filled 2020-07-02 (×2): qty 20
  Filled 2020-07-02: qty 2

## 2020-07-02 NOTE — ED Triage Notes (Addendum)
C/O cough, sore throat, nasal congestion x 2 days.  AAOx3.  Skin warm and dry. NO SOB/ DOE.  NAD  Strong cough noted in triage

## 2020-07-02 NOTE — ED Provider Notes (Signed)
-----------------------------------------   8:26 PM on 07/02/2020 -----------------------------------------  On reassessment, patient is mildly Tachypneic, reports feeling dyspnea as well as a persistent cough.  She also denotes that she is having her chronic back pain and would like one of her Nucynta tablets which I have ordered.  She does not appear well, she appears slightly toxic and septic.  She does meet sepsis criteria.  Will cover empirically for community-acquired pneumonia, COVID-negative.  Discussed with the patient and she would strongly preference admission as she does not feel well enough to go home and based on her assessment I agree at this point.  We will admit for further care and management to the hospitalist service.  Working diagnosis community-acquired pneumonia.  Sepsis   Sharyn Creamer, MD 07/02/20 2027

## 2020-07-02 NOTE — ED Notes (Signed)
resume care from [facility] .  Pt alert  Sinus tach on monitor.  Pt has sob.  Pt reports a cough.  Iv in place.

## 2020-07-02 NOTE — Progress Notes (Signed)
Following for Code Sepsis  

## 2020-07-02 NOTE — ED Notes (Signed)
Report off to jennifer rn 

## 2020-07-02 NOTE — ED Provider Notes (Signed)
Nursing keep me abreast of significant lab draw delayed.  Awaiting IV team   Sharyn Creamer, MD 07/02/20 1840

## 2020-07-02 NOTE — ED Provider Notes (Signed)
[facility] Emergency Department Provider Note  Time seen: 2:35 PM  I have reviewed the triage vital signs and the nursing notes.   HISTORY  Chief Complaint Cough  HPI [name] is a 75 y.o. female with a past medical history of gastric reflux, hyperlipidemia, hypertension, peripheral edema, CHF, presents to the emergency department for 2 days of cough sore throat nasal congestion.  According to the patient over the past 2 to 3 days she has had a significant cough, states it is keeping her awake at night.  Patient states mild shortness of breath as well as nasal congestion.  Patient is fully vaccinated and boosted against COVID-19.  Denies any known fever.  No chest pain nausea vomiting or abdominal pain.  Does state lower extremity edema but states this is chronic.   Past Medical History:  Diagnosis Date  . Benign positional vertigo 03/04/2016  . Brain injury (HCC)   . Climacteric   . Depression   . 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 Dyslipidemia   . GERD (gastroesophageal reflux disease)   . Hypertension   . Hypokalemia   . Hypothyroidism   . IBS (irritable bowel syndrome)   . OAB (overactive bladder)   . Ovarian cyst 03/25/2015   3cm simple cyst on right ovary  . Rhinitis   . Sleep apnea   . Spasm of thoracolumbar muscle     Patient Active Problem List   Diagnosis Date Noted  . History of anesthesia reaction 02/20/2020  . Swelling of limb 02/20/2020  . Chronic diastolic CHF (congestive heart failure) (HCC) 08/22/2019  . Edema 07/29/2019  . Prediabetes 07/07/2019  . Depression 04/26/2019  . Disease of thyroid gland 04/26/2019  . GERD (gastroesophageal reflux disease) 04/26/2019  . History of head injury 04/26/2019  . 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/26/2019  . 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 Spasm of lumbar paraspinous muscle 02/14/2019  . Chronic pain syndrome 02/14/2019  . Use of cane as  ambulatory aid 01/04/2019  . Acneiform eruption 07/09/2017  . Lumbar spondylosis 04/20/2017  . Lumbar facet arthropathy 04/20/2017  . Lumbar degenerative disc disease 04/20/2017  . Anxiety 11/05/2016  . Facet arthritis of lumbar region 09/16/2016  . Low back pain 07/25/2016  . Morbid obesity with BMI of 40.0-44.9, adult (HCC) 06/03/2016  . Acquired hypothyroidism 03/04/2016  . Cognitive deficit as late effect of traumatic brain injury (HCC) 03/04/2016  . Health care maintenance 03/04/2016  . HTN, goal below 140/90 03/04/2016  . Hypokalemia 03/04/2016  . 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  . Localized primary osteoarthritis of lower leg, right 08/21/2015  . Primary osteoarthritis of left knee 04/10/2015  . Right knee pain 01/18/2013    Past Surgical History:  Procedure Laterality Date  . ABDOMINAL HYSTERECTOMY  07/12/2014  . ANKLE RECONSTRUCTION Left   . BRACHIOPLASTY    . CATARACT EXTRACTION    . CHOLECYSTECTOMY    . COLONOSCOPY WITH PROPOFOL N/A 12/07/2016   Procedure: COLONOSCOPY WITH PROPOFOL;  Surgeon: Scot Jun, MD;  Location: [facility] ENDOSCOPY;  Service: Endoscopy;  Laterality: N/A;  . JOINT REPLACEMENT Bilateral 07/2014, 03/2015  . nasal reconstrucion    . REDUCTION MAMMAPLASTY Bilateral   . SHOULDER OPEN ROTATOR CUFF REPAIR    . SOFT TISSUE TUMOR RESECTION     excision tumor soft tissue neck, and soft tissue subcutaneous axilla  . TOE SURGERY Right    reconstruction right foot 4th and 5th toe  Prior to Admission medications   Medication Sig Start Date End Date Taking? Authorizing Provider  acetaminophen (TYLENOL) 500 MG tablet Take 500 mg by mouth every 6 (six) hours as needed.    [provider]  albuterol (ACCUNEB) 0.63 MG/3ML nebulizer solution albuterol sulfate HFA 90 mcg/actuation aerosol inhaler  INHALE 2 INHALATIONS INTO THE LUNGS EVERY 6 HOURS AS NEEDED FOR WHEEZING    [provider]  amantadine (SYMMETREL) 100 MG capsule  Take 100 mg by mouth 2 (two) times daily.    [provider]  amLODipine (NORVASC) 5 MG tablet Take 5 mg by mouth daily.    [provider]  ARIPiprazole (ABILIFY) 2 MG tablet Take by mouth. 01/27/20   [provider]  Ascorbic Acid (VITAMIN C) 1000 MG tablet Take 1,000 mg daily by mouth.    [provider]  aspirin EC 81 MG tablet Take 81 mg by mouth daily.    [provider]  azelastine (ASTELIN) 0.1 % nasal spray 2 sprays as needed for rhinitis. Use in each nostril as directed    [provider]  baclofen (LIORESAL) 20 MG tablet Take 20 mg by mouth daily as needed for muscle spasms.    [provider]  Biotin 10 MG CAPS Take by mouth daily.    [provider]  carvedilol (COREG) 25 MG tablet Take 25 mg by mouth 2 (two) times daily with a meal.     [provider]  celecoxib (CELEBREX) 200 MG capsule Take 200 mg by mouth 2 (two) times daily.    [provider]  Cholecalciferol (VITAMIN D3) 2000 units TABS Take 2,000 Units daily by mouth.    [provider]  donepezil (ARICEPT) 10 MG tablet Take 10 mg by mouth at bedtime.    [provider]  doxycycline (VIBRAMYCIN) 100 MG capsule Take 100 mg by mouth 2 (two) times daily. Patient not taking: Reported on 05/01/2020    [provider]  esomeprazole (NEXIUM) 40 MG capsule Take 40 mg by mouth daily.    [provider]  fluticasone (FLONASE) 50 MCG/ACT nasal spray fluticasone propionate 50 mcg/actuation nasal spray,suspension  SHAKE LIQUID AND USE 1 SPRAY IN EACH NOSTRIL TWICE DAILY    [provider]  folic acid (FOLVITE) 400 MCG tablet Take 400 mcg by mouth at bedtime. 4 tabs at bedtime    [provider]  levothyroxine (SYNTHROID, LEVOTHROID) 112 MCG tablet Take 112 mcg by mouth daily.    [provider]  losartan (COZAAR) 100 MG tablet Take 100 mg by mouth daily.    [provider]   magnesium 30 MG tablet Take 30 mg by mouth daily.    [provider]  Melatonin 3 MG TABS Take 1 tablet at bedtime by mouth.     [provider]  montelukast (SINGULAIR) 10 MG tablet Take 10 mg by mouth every evening.    [provider]  Multiple Vitamin (MULTIVITAMIN) tablet Take 1 tablet by mouth daily.    [provider]  OnabotulinumtoxinA (BOTOX IJ) Inject every 4 (four) months as directed.    [provider]  potassium chloride (KLOR-CON) 10 MEQ tablet potassium chloride ER 10 mEq tablet,extended release  TAKE 1 TABLET BY MOUTH DAILY ALONG WITH HIGHER DOSE TORSEMIDE AS NEEDED FOR SWELLING Patient not taking: Reported on 05/01/2020    [provider]  potassium chloride SA (K-DUR,KLOR-CON) 20 MEQ tablet Take 20 mEq by mouth 3 (three) times daily.  [provider]  tapentadol (NUCYNTA) 50 MG tablet Take 1 tablet (50 mg total) by mouth daily as needed for moderate pain or severe pain. For chronic pain syndrome 06/25/20 07/25/20  Edward Jolly, MD  tapentadol (NUCYNTA) 50 MG tablet Take 1 tablet (50 mg total) by mouth daily as needed for moderate pain or severe pain. For chronic pain syndrome 07/25/20 08/24/20  Edward Jolly, MD  torsemide (DEMADEX) 10 MG tablet Take 20 mg by mouth daily.     [provider]  traZODone (DESYREL) 50 MG tablet Take by mouth at bedtime. 50-150mg  nightly as needed    [provider]  venlafaxine (EFFEXOR) 75 MG tablet Take 75 mg by mouth 3 (three) times daily.    [provider]  zinc gluconate 50 MG tablet Take 50 mg by mouth daily.    [provider]    Allergies  Allergen Reactions  . Codeine Other (See Comments)    Seizure. Tolerates tramadol  . Povidone Iodine Shortness Of Breath    Patient had difficulty breathing during procedure where dura prep was used to clean back.   . Hydromorphone Hives, Itching and Other (See Comments)    Severe itching  . Latex  Itching and Other (See Comments)    blisters  . Oxycodone-Acetaminophen Nausea And Vomiting  . Sulfa Antibiotics Itching and Swelling    Swelling eyes, hands, feet. Was at home and didn't go to the hospital  . Bupropion Other (See Comments)  . Gabapentin Other (See Comments)    tremors  . Hydrocodone     Tolerates tramadol  . Morphine And Related Itching and Rash  . Procaine     Family History  Problem Relation Age of Onset  . Lung cancer Mother   . Lung cancer Father   . Breast cancer Paternal Aunt     Social History Social History   Tobacco Use  . Smoking status: Never Smoker  . Smokeless tobacco: Never Used  Vaping Use  . Vaping Use: Never used  Substance Use Topics  . Alcohol use: Yes    Alcohol/week: 2.0 standard drinks    Types: 2 Glasses of wine per week    Comment: per week  . Drug use: No    Review of Systems Constitutional: Negative for fever. Cardiovascular: Negative for chest pain. Respiratory: Positive for frequent cough.  Occasional shortness of breath. Gastrointestinal: Negative for abdominal pain, vomiting and diarrhea. Musculoskeletal: Bilateral lower extreme edema, chronic per patient. Skin: Negative for skin complaints  Neurological: Negative for headache All other ROS negative  ____________________________________________   PHYSICAL EXAM:  VITAL SIGNS: ED Triage Vitals  Enc Vitals Group     BP 07/02/20 1206 (!) 126/94     Pulse Rate 07/02/20 1206 96     Resp 07/02/20 1206 16     Temp 07/02/20 1206 98.4 F (36.9 C)     Temp Source 07/02/20 1206 Oral     SpO2 07/02/20 1206 98 %     Weight 07/02/20 1206 296 lb 15.4 oz (134.7 kg)     Height 07/02/20 1206 5\' 6"  (1.676 m)     Head Circumference --      Peak Flow --      Pain Score 07/02/20 1204 0     Pain Loc --      Pain Edu? --      Excl. in GC? --    Constitutional: Alert and oriented. Well appearing and in no distress. Eyes: Normal exam ENT  Head: Normocephalic and  atraumatic.      Mouth/Throat: Mucous membranes are moist. Cardiovascular: Normal rate, regular rhythm. Respiratory: Normal respiratory effort without tachypnea nor retractions. Breath sounds are clear without obvious wheeze rales or rhonchi Gastrointestinal: Soft and nontender. No distention.  Musculoskeletal: Nontender with normal range of motion in all extremities.  2+ lower extremity edema equal bilaterally. Neurologic:  Normal speech and language. No gross focal neurologic deficits  Skin:  Skin is warm, dry and intact.  Psychiatric: Mood and affect are normal.   ____________________________________________    EKG  EKG viewed and interpreted by myself as sinus tachycardia 106 bpm with a narrow QRS, normal axis, normal intervals, nonspecific ST changes  ____________________________________________    RADIOLOGY  Chest x-ray shows streaky left basilar opacity.  Atelectasis versus infiltrate.  ____________________________________________   INITIAL IMPRESSION / ASSESSMENT AND PLAN / ED COURSE  Pertinent labs & imaging results that were available during my care of the patient were reviewed by me and considered in my medical decision making (see chart for details).   Patient presents emergency department for shortness of breath cough congestion over the past 2 days.  Patient has 2+ pitting edema to lower extremities which she states is largely chronic.  Denies any chest pain.  Reassuring vitals.  Patient does have a frequent cough during my evaluation.  We will check labs including cardiac enzymes and BNP.  Given the patient's chest x-ray findings we will also check a COVID swab.  Patient agreeable.  Chest x-ray shows streaky left basilar opacity could be atelectasis versus infiltrate.  Given the patient's clinical symptoms of worsening cough over the past 2 days we will cover antibiotics if COVID is negative.  Lab work is pending.  Patient care signed out to oncoming  provider.  Amiyah Shryock Hollifield [name] was evaluated in Emergency Department on 07/02/2020 for the symptoms described in the history of present illness. She was evaluated in the context of the global COVID-19 pandemic, which necessitated consideration that the patient might be at risk for infection with the SARS-CoV-2 virus that causes COVID-19. Institutional protocols and algorithms that pertain to the evaluation of patients at risk for COVID-19 are in a state of rapid change based on information released by regulatory bodies including the CDC and federal and state organizations. These policies and algorithms were followed during the patient's care in the ED.  ____________________________________________   FINAL CLINICAL IMPRESSION(S) / ED DIAGNOSES  Pneumonia   Minna Antis, MD 07/02/20 1523

## 2020-07-02 NOTE — ED Notes (Signed)
Pt ambulatory to toilet with steady gait.  

## 2020-07-02 NOTE — ED Triage Notes (Signed)
Presents via EMS   States she developed cough and congestion couple of days ago

## 2020-07-03 DIAGNOSIS — R4189 Other symptoms and signs involving cognitive functions and awareness: Secondary | ICD-10-CM | POA: Diagnosis present

## 2020-07-03 DIAGNOSIS — Z79890 Hormone replacement therapy (postmenopausal): Secondary | ICD-10-CM | POA: Diagnosis not present

## 2020-07-03 DIAGNOSIS — E039 Hypothyroidism, unspecified: Secondary | ICD-10-CM | POA: Diagnosis present

## 2020-07-03 DIAGNOSIS — Z6841 Body Mass Index (BMI) 40.0 and over, adult: Secondary | ICD-10-CM | POA: Diagnosis not present

## 2020-07-03 DIAGNOSIS — M47812 Spondylosis without myelopathy or radiculopathy, cervical region: Secondary | ICD-10-CM | POA: Diagnosis present

## 2020-07-03 DIAGNOSIS — G894 Chronic pain syndrome: Secondary | ICD-10-CM | POA: Diagnosis present

## 2020-07-03 DIAGNOSIS — J189 Pneumonia, unspecified organism: Secondary | ICD-10-CM

## 2020-07-03 DIAGNOSIS — E785 Hyperlipidemia, unspecified: Secondary | ICD-10-CM | POA: Diagnosis present

## 2020-07-03 DIAGNOSIS — G4733 Obstructive sleep apnea (adult) (pediatric): Secondary | ICD-10-CM

## 2020-07-03 DIAGNOSIS — M47816 Spondylosis without myelopathy or radiculopathy, lumbar region: Secondary | ICD-10-CM | POA: Diagnosis present

## 2020-07-03 DIAGNOSIS — K219 Gastro-esophageal reflux disease without esophagitis: Secondary | ICD-10-CM | POA: Diagnosis present

## 2020-07-03 DIAGNOSIS — Z884 Allergy status to anesthetic agent status: Secondary | ICD-10-CM | POA: Diagnosis not present

## 2020-07-03 DIAGNOSIS — I5032 Chronic diastolic (congestive) heart failure: Secondary | ICD-10-CM | POA: Diagnosis present

## 2020-07-03 DIAGNOSIS — Z882 Allergy status to sulfonamides status: Secondary | ICD-10-CM | POA: Diagnosis not present

## 2020-07-03 DIAGNOSIS — Z20822 Contact with and (suspected) exposure to covid-19: Secondary | ICD-10-CM | POA: Diagnosis present

## 2020-07-03 DIAGNOSIS — I110 Hypertensive heart disease with heart failure: Secondary | ICD-10-CM | POA: Diagnosis present

## 2020-07-03 DIAGNOSIS — E876 Hypokalemia: Secondary | ICD-10-CM | POA: Diagnosis present

## 2020-07-03 DIAGNOSIS — Z87828 Personal history of other (healed) physical injury and trauma: Secondary | ICD-10-CM | POA: Diagnosis not present

## 2020-07-03 DIAGNOSIS — Z885 Allergy status to narcotic agent status: Secondary | ICD-10-CM | POA: Diagnosis not present

## 2020-07-03 DIAGNOSIS — Z79899 Other long term (current) drug therapy: Secondary | ICD-10-CM | POA: Diagnosis not present

## 2020-07-03 DIAGNOSIS — R0902 Hypoxemia: Secondary | ICD-10-CM | POA: Diagnosis not present

## 2020-07-03 DIAGNOSIS — A419 Sepsis, unspecified organism: Secondary | ICD-10-CM | POA: Diagnosis not present

## 2020-07-03 DIAGNOSIS — Z7982 Long term (current) use of aspirin: Secondary | ICD-10-CM | POA: Diagnosis not present

## 2020-07-03 LAB — MAGNESIUM: Magnesium: 2 mg/dL (ref 1.7–2.4)

## 2020-07-03 LAB — PHOSPHORUS: Phosphorus: 3.1 mg/dL (ref 2.5–4.6)

## 2020-07-03 LAB — HIV ANTIBODY (ROUTINE TESTING W REFLEX): HIV Screen 4th Generation wRfx: NONREACTIVE

## 2020-07-03 MED ORDER — CARVEDILOL 25 MG PO TABS
25.0000 mg | ORAL_TABLET | Freq: Two times a day (BID) | ORAL | Status: DC
  Administered 2020-07-03 – 2020-07-06 (×7): 25 mg via ORAL
  Filled 2020-07-03 (×7): qty 1

## 2020-07-03 MED ORDER — TAPENTADOL HCL 50 MG PO TABS
50.0000 mg | ORAL_TABLET | Freq: Every day | ORAL | Status: DC | PRN
  Administered 2020-07-05 – 2020-07-06 (×2): 50 mg via ORAL
  Filled 2020-07-03 (×2): qty 1

## 2020-07-03 MED ORDER — POTASSIUM CHLORIDE 10 MEQ/100ML IV SOLN
10.0000 meq | INTRAVENOUS | Status: AC
  Administered 2020-07-03 (×4): 10 meq via INTRAVENOUS
  Filled 2020-07-03 (×4): qty 100

## 2020-07-03 MED ORDER — SODIUM CHLORIDE 0.9 % IV SOLN
INTRAVENOUS | Status: DC | PRN
  Administered 2020-07-03: 250 mL via INTRAVENOUS

## 2020-07-03 MED ORDER — VENLAFAXINE HCL ER 75 MG PO CP24
75.0000 mg | ORAL_CAPSULE | Freq: Three times a day (TID) | ORAL | Status: DC
  Administered 2020-07-03 – 2020-07-06 (×9): 75 mg via ORAL
  Filled 2020-07-03 (×9): qty 1

## 2020-07-03 MED ORDER — POTASSIUM CHLORIDE 20 MEQ PO PACK
40.0000 meq | PACK | Freq: Once | ORAL | Status: AC
  Administered 2020-07-03: 40 meq via ORAL
  Filled 2020-07-03: qty 2

## 2020-07-03 MED ORDER — DEXTROSE 50 % IV SOLN
INTRAVENOUS | Status: AC
  Filled 2020-07-03: qty 50

## 2020-07-03 MED ORDER — TORSEMIDE 20 MG PO TABS
20.0000 mg | ORAL_TABLET | Freq: Every day | ORAL | Status: DC
  Administered 2020-07-03 – 2020-07-06 (×4): 20 mg via ORAL
  Filled 2020-07-03 (×4): qty 1

## 2020-07-03 MED ORDER — TRAZODONE HCL 100 MG PO TABS
100.0000 mg | ORAL_TABLET | Freq: Every day | ORAL | Status: DC
  Administered 2020-07-03 – 2020-07-05 (×3): 100 mg via ORAL
  Filled 2020-07-03 (×3): qty 1

## 2020-07-03 MED ORDER — DM-GUAIFENESIN ER 30-600 MG PO TB12
1.0000 | ORAL_TABLET | Freq: Two times a day (BID) | ORAL | Status: DC
  Administered 2020-07-03 – 2020-07-06 (×7): 1 via ORAL
  Filled 2020-07-03 (×7): qty 1

## 2020-07-03 MED ORDER — ASPIRIN EC 81 MG PO TBEC
81.0000 mg | DELAYED_RELEASE_TABLET | Freq: Every day | ORAL | Status: DC
  Administered 2020-07-03 – 2020-07-06 (×4): 81 mg via ORAL
  Filled 2020-07-03 (×5): qty 1

## 2020-07-03 MED ORDER — NYSTATIN 100000 UNIT/GM EX POWD
Freq: Two times a day (BID) | CUTANEOUS | Status: DC
  Filled 2020-07-03 (×2): qty 15

## 2020-07-03 MED ORDER — AMLODIPINE BESYLATE 5 MG PO TABS
5.0000 mg | ORAL_TABLET | Freq: Every day | ORAL | Status: DC
  Administered 2020-07-03 – 2020-07-06 (×4): 5 mg via ORAL
  Filled 2020-07-03 (×4): qty 1

## 2020-07-03 MED ORDER — LOSARTAN POTASSIUM 50 MG PO TABS
100.0000 mg | ORAL_TABLET | Freq: Every day | ORAL | Status: DC
  Administered 2020-07-03 – 2020-07-06 (×4): 100 mg via ORAL
  Filled 2020-07-03 (×4): qty 2

## 2020-07-03 MED ORDER — LEVOTHYROXINE SODIUM 112 MCG PO TABS
112.0000 ug | ORAL_TABLET | Freq: Every day | ORAL | Status: DC
  Administered 2020-07-04 – 2020-07-06 (×3): 112 ug via ORAL
  Filled 2020-07-03 (×3): qty 1

## 2020-07-03 MED ORDER — ARIPIPRAZOLE 2 MG PO TABS
2.0000 mg | ORAL_TABLET | Freq: Every day | ORAL | Status: DC
  Administered 2020-07-04 – 2020-07-06 (×3): 2 mg via ORAL
  Filled 2020-07-03 (×3): qty 1

## 2020-07-03 MED ORDER — AMANTADINE HCL 100 MG PO CAPS
100.0000 mg | ORAL_CAPSULE | Freq: Two times a day (BID) | ORAL | Status: DC
  Administered 2020-07-03 – 2020-07-06 (×6): 100 mg via ORAL
  Filled 2020-07-03 (×9): qty 1

## 2020-07-03 MED ORDER — TOPIRAMATE 25 MG PO TABS
25.0000 mg | ORAL_TABLET | Freq: Every day | ORAL | Status: DC
  Administered 2020-07-04 – 2020-07-06 (×3): 25 mg via ORAL
  Filled 2020-07-03 (×3): qty 1

## 2020-07-03 MED ORDER — ACETAMINOPHEN 325 MG PO TABS
650.0000 mg | ORAL_TABLET | Freq: Four times a day (QID) | ORAL | Status: DC | PRN
  Administered 2020-07-03 – 2020-07-04 (×3): 650 mg via ORAL
  Filled 2020-07-03 (×3): qty 2

## 2020-07-03 MED ORDER — DONEPEZIL HCL 5 MG PO TABS
10.0000 mg | ORAL_TABLET | Freq: Every day | ORAL | Status: DC
  Administered 2020-07-03 – 2020-07-05 (×3): 10 mg via ORAL
  Filled 2020-07-03 (×6): qty 2

## 2020-07-03 MED ORDER — LORATADINE 10 MG PO TABS
10.0000 mg | ORAL_TABLET | Freq: Every day | ORAL | Status: DC
  Administered 2020-07-03 – 2020-07-06 (×4): 10 mg via ORAL
  Filled 2020-07-03 (×4): qty 1

## 2020-07-03 MED ORDER — PHENOL 1.4 % MT LIQD
1.0000 | OROMUCOSAL | Status: DC | PRN
  Filled 2020-07-03: qty 177

## 2020-07-03 NOTE — ED Notes (Signed)
Called respiratory about pt CPAP and was informed that they don't have any.

## 2020-07-03 NOTE — Progress Notes (Signed)
Mobility Specialist - Progress Note   07/03/20 1126  Mobility  Activity Sat and stood x 3  Level of Assistance Standby assist, set-up cues, supervision of patient - no hands on  Assistive Device None  Mobility Response Tolerated well  Mobility performed by Mobility specialist  $Mobility charge 1 Mobility    Pt sitting on recliner upon arrival. Pt requested to use the bathroom. Deferred ambulating to the bathroom d/t pt stating "I don't think I can make it, I really need to go". Provided pt w/ a bedpan. Pt utilized bedpan on recliner. Pt S2S w/o AD. No LOB noted. Pt had a large amount of loose BM. Mob. Specialist assisted w toilet hygiene. Before the end of session, pt requested to be placed on bed pan again, stating "I think I need to go again". Clean bed pan placed. Pt left sitting on bedpan on recliner w all needs placed in reach. Nursing notified. Overall, pt tolerated session well. Pt states she ambulates w/ cane at baseline.      Treva Huyett Mobility Specialist  07/03/20, 11:31 AM

## 2020-07-03 NOTE — H&P (Addendum)
History and Physical    [name] GXQ:[id number] DOB: 1945-12-20 DOA: 07/02/2020  PCP: Lauro Regulus, MD  Patient coming from: home  I have personally briefly reviewed patient's old medical records in [facility]  Chief Complaint: coughing, weakness  HPI: [name] is a 75 y.o. 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  About 2 days ago she started to have a sore throat and felt congested.  The following day she began to have continuous coughing productive of sputum.  Also has lower rib/chest pain associated with her cough.  States she had a low-grade fever.  Has had decreased appetite.  Denies any nausea, vomiting or diarrhea.  Denies any tobacco use.  No sick contact.  She did not take any of her medications yesterday since she did not feel well.  ED Course: She was afebrile but tachycardic and tachypneic although stable on room air.  Also had elevated blood pressure up to 180/60s. CBC showed leukocytosis of 13.1.  Lactic acid of 1.1.  Hypokalemia 2.9.  CMP otherwise unremarkable. Negative influenza and COVID test. Chest x-ray showed possible left basilar infiltrate.  She was started on Rocephin and azithromycin in the ED.  Review of Systems: Constitutional: No Weight Change, No Fever ENT/Mouth: + sore throat, No Rhinorrhea Eyes: No Eye Pain, No Vision Changes Cardiovascular: + Chest Pain, + SOB, No PND, No Dyspnea on Exertion, No Orthopnea, No Claudication, +Edema, No Palpitations Respiratory: + Cough,+ Sputum, No Wheezing, + Dyspnea  Gastrointestinal: No Nausea, No Vomiting, No Diarrhea, No Constipation, No Pain Genitourinary: no Urinary Incontinence, No Urgency, No Flank Pain Musculoskeletal: No Arthralgias, No Myalgias Skin: No Skin  Lesions, No Pruritus, Neuro: no Weakness, No Numbness,  No Loss of Consciousness, No Syncope Psych: No Anxiety/Panic, No Depression, + decrease appetite Heme/Lymph: No Bruising, No Bleeding   Past Medical History:  Diagnosis Date  . Benign positional vertigo 03/04/2016  . Brain injury (HCC)   . Climacteric   . Depression   . 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 Dyslipidemia   . GERD (gastroesophageal reflux disease)   . Hypertension   . Hypokalemia   . Hypothyroidism   . IBS (irritable bowel syndrome)   . OAB (overactive bladder)   . Ovarian cyst 03/25/2015   3cm simple cyst on right ovary  . Rhinitis   . Sleep apnea   . Spasm of thoracolumbar muscle     Past Surgical History:  Procedure Laterality Date  . ABDOMINAL HYSTERECTOMY  07/12/2014  . ANKLE RECONSTRUCTION Left   . BRACHIOPLASTY    . CATARACT EXTRACTION    . CHOLECYSTECTOMY    . COLONOSCOPY WITH PROPOFOL N/A 12/07/2016   Procedure: COLONOSCOPY WITH PROPOFOL;  Surgeon: Scot Jun, MD;  Location: [facility] ENDOSCOPY;  Service: Endoscopy;  Laterality: N/A;  . JOINT REPLACEMENT Bilateral 07/2014, 03/2015  . nasal reconstrucion    . REDUCTION MAMMAPLASTY Bilateral   . SHOULDER OPEN ROTATOR CUFF REPAIR    . SOFT TISSUE TUMOR RESECTION     excision tumor soft tissue neck, and soft tissue subcutaneous axilla  . TOE SURGERY Right    reconstruction right foot 4th and 5th toe     reports that she has never smoked. She has never used smokeless tobacco. She reports current alcohol use of about  2.0 standard drinks of alcohol per week. She reports that she does not use drugs. Social History  Allergies  Allergen Reactions  . Codeine Other (See Comments)    Seizure. Tolerates tramadol  . Povidone Iodine Shortness Of Breath    Patient had difficulty breathing during procedure where dura prep was used to clean back.   . Hydromorphone Hives, Itching and Other (See Comments)    Severe itching  .  Latex Itching and Other (See Comments)    blisters  . Oxycodone-Acetaminophen Nausea And Vomiting  . Sulfa Antibiotics Itching and Swelling    Swelling eyes, hands, feet. Was at home and didn't go to the hospital  . Bupropion Other (See Comments)  . Gabapentin Other (See Comments)    tremors  . Hydrocodone     Tolerates tramadol  . Morphine And Related Itching and Rash  . Procaine     Family History  Problem Relation Age of Onset  . Lung cancer Mother   . Lung cancer Father   . Breast cancer Paternal Aunt      Prior to Admission medications   Medication Sig Start Date End Date Taking? Authorizing Provider  azithromycin (ZITHROMAX Z-PAK) 250 MG tablet Take 2 tablets (500 mg) on  Day 1,  followed by 1 tablet (250 mg) once daily on Days 2 through 5. 07/02/20 07/07/20 Yes Paduchowski, Caryn Bee, MD  cephALEXin (KEFLEX) 500 MG capsule Take 1 capsule (500 mg total) by mouth 3 (three) times daily. 07/02/20  Yes Minna Antis, MD  acetaminophen (TYLENOL) 500 MG tablet Take 500 mg by mouth every 6 (six) hours as needed.    [provider]  albuterol (ACCUNEB) 0.63 MG/3ML nebulizer solution albuterol sulfate HFA 90 mcg/actuation aerosol inhaler  INHALE 2 INHALATIONS INTO THE LUNGS EVERY 6 HOURS AS NEEDED FOR WHEEZING    [provider]  amantadine (SYMMETREL) 100 MG capsule Take 100 mg by mouth 2 (two) times daily.    [provider]  amLODipine (NORVASC) 5 MG tablet Take 5 mg by mouth daily.    [provider]  ARIPiprazole (ABILIFY) 2 MG tablet Take by mouth. 01/27/20   [provider]  Ascorbic Acid (VITAMIN C) 1000 MG tablet Take 1,000 mg daily by mouth.    [provider]  aspirin EC 81 MG tablet Take 81 mg by mouth daily.    [provider]  azelastine (ASTELIN) 0.1 % nasal spray 2 sprays as needed for rhinitis. Use in each nostril as directed    [provider]  baclofen (LIORESAL) 20 MG tablet Take 20 mg by mouth  daily as needed for muscle spasms.    [provider]  Biotin 10 MG CAPS Take by mouth daily.    [provider]  carvedilol (COREG) 25 MG tablet Take 25 mg by mouth 2 (two) times daily with a meal.     [provider]  celecoxib (CELEBREX) 200 MG capsule Take 200 mg by mouth 2 (two) times daily.    [provider]  Cholecalciferol (VITAMIN D3) 2000 units TABS Take 2,000 Units daily by mouth.    [provider]  donepezil (ARICEPT) 10 MG tablet Take 10 mg by mouth at bedtime.    [provider]  doxycycline (VIBRAMYCIN) 100 MG capsule Take 100 mg by mouth 2 (two) times daily. Patient not taking: Reported on 05/01/2020    [provider]  esomeprazole (NEXIUM) 40 MG capsule Take 40 mg by mouth daily.  [provider]  fluticasone (FLONASE) 50 MCG/ACT nasal spray fluticasone propionate 50 mcg/actuation nasal spray,suspension  SHAKE LIQUID AND USE 1 SPRAY IN EACH NOSTRIL TWICE DAILY    [provider]  folic acid (FOLVITE) 400 MCG tablet Take 400 mcg by mouth at bedtime. 4 tabs at bedtime    [provider]  levothyroxine (SYNTHROID, LEVOTHROID) 112 MCG tablet Take 112 mcg by mouth daily.    [provider]  losartan (COZAAR) 100 MG tablet Take 100 mg by mouth daily.    [provider]  magnesium 30 MG tablet Take 30 mg by mouth daily.    [provider]  Melatonin 3 MG TABS Take 1 tablet at bedtime by mouth.     [provider]  montelukast (SINGULAIR) 10 MG tablet Take 10 mg by mouth every evening.    [provider]  Multiple Vitamin (MULTIVITAMIN) tablet Take 1 tablet by mouth daily.    [provider]  OnabotulinumtoxinA (BOTOX IJ) Inject every 4 (four) months as directed.    [provider]  potassium chloride (KLOR-CON) 10 MEQ tablet potassium chloride ER 10 mEq tablet,extended release  TAKE 1 TABLET BY MOUTH DAILY ALONG WITH HIGHER DOSE  TORSEMIDE AS NEEDED FOR SWELLING Patient not taking: Reported on 05/01/2020    [provider]  potassium chloride SA (K-DUR,KLOR-CON) 20 MEQ tablet Take 20 mEq by mouth 3 (three) times daily.    [provider]  tapentadol (NUCYNTA) 50 MG tablet Take 1 tablet (50 mg total) by mouth daily as needed for moderate pain or severe pain. For chronic pain syndrome 06/25/20 07/25/20  Edward JollyLateef, Bilal, MD  tapentadol (NUCYNTA) 50 MG tablet Take 1 tablet (50 mg total) by mouth daily as needed for moderate pain or severe pain. For chronic pain syndrome 07/25/20 08/24/20  Edward JollyLateef, Bilal, MD  torsemide (DEMADEX) 10 MG tablet Take 20 mg by mouth daily.     [provider]  traZODone (DESYREL) 50 MG tablet Take by mouth at bedtime. 50-150mg  nightly as needed    [provider]  venlafaxine (EFFEXOR) 75 MG tablet Take 75 mg by mouth 3 (three) times daily.    [provider]  zinc gluconate 50 MG tablet Take 50 mg by mouth daily.    [provider]    Physical Exam: Vitals:   07/02/20 2230 07/02/20 2300 07/02/20 2330 07/03/20 0000  BP: (!) 179/78 (!) 161/79 (!) 157/82 (!) 164/95  Pulse: (!) 115 98 (!) 105 (!) 115  Resp: (!) 24 (!) 23 19 (!) 24  Temp:      TempSrc:      SpO2: 95% 92% 93% 96%  Weight:      Height:        Constitutional: NAD, calm, comfortable nontoxic-appearing morbidly obese female sitting upright at 40 degree incline in bed Vitals:   07/02/20 2230 07/02/20 2300 07/02/20 2330 07/03/20 0000  BP: (!) 179/78 (!) 161/79 (!) 157/82 (!) 164/95  Pulse: (!) 115 98 (!) 105 (!) 115  Resp: (!) 24 (!) 23 19 (!) 24  Temp:      TempSrc:      SpO2: 95% 92% 93% 96%  Weight:      Height:       Eyes: PERRL, lids and conjunctivae normal ENMT: Mucous membranes are moist.  Neck: normal, supple Respiratory: Left lower lobe crackles.  Normal respiratory effort on room air. No accessory muscle use.  Cardiovascular: Regular rate and rhythm, no murmurs /  rubs / gallops.  +3 pitting edema of the pretibial region of bilateral lower extremity.   Abdomen: no tenderness, no masses palpated. N Bowel sounds positive. GU: Pure wick in place Musculoskeletal: no clubbing / cyanosis. No joint deformity upper and lower extremities. Normal muscle tone.  Skin: Erythematous and mildly increased warmth of pretibial region of bilateral lower extremity Neurologic: CN 2-12 grossly intact. Sensation intact,  Strength 5/5 in all 4.  Psychiatric: Normal judgment and insight. Alert and oriented x 3. Normal mood.     Labs on Admission: I have personally reviewed following labs and imaging studies  CBC: Recent Labs  Lab 07/02/20 1428  WBC 13.1*  HGB 12.9  HCT 39.0  MCV 94.7  PLT 198   Basic Metabolic Panel: Recent Labs  Lab 07/02/20 1919  NA 141  K 2.9*  CL 103  CO2 25  GLUCOSE 122*  BUN 6*  CREATININE 0.61  CALCIUM 9.7   GFR: Estimated Creatinine Clearance: 87.2 mL/min (by C-G formula based on SCr of 0.61 mg/dL). Liver Function Tests: Recent Labs  Lab 07/02/20 1919  AST 25  ALT 21  ALKPHOS 80  BILITOT 0.9  PROT 7.7  ALBUMIN 3.7   No results for input(s): LIPASE, AMYLASE in the last 168 hours. No results for input(s): AMMONIA in the last 168 hours. Coagulation Profile: No results for input(s): INR, PROTIME in the last 168 hours. Cardiac Enzymes: No results for input(s): CKTOTAL, CKMB, CKMBINDEX, TROPONINI in the last 168 hours. BNP (last 3 results) No results for input(s): PROBNP in the last 8760 hours. HbA1C: No results for input(s): HGBA1C in the last 72 hours. CBG: No results for input(s): GLUCAP in the last 168 hours. Lipid Profile: No results for input(s): CHOL, HDL, LDLCALC, TRIG, CHOLHDL, LDLDIRECT in the last 72 hours. Thyroid Function Tests: No results for input(s): TSH, T4TOTAL, FREET4, T3FREE, THYROIDAB in the last 72 hours. Anemia Panel: No results for input(s): VITAMINB12, FOLATE, FERRITIN, TIBC, IRON, RETICCTPCT  in the last 72 hours. Urine analysis: No results found for: COLORURINE, APPEARANCEUR, LABSPEC, PHURINE, GLUCOSEU, HGBUR, BILIRUBINUR, KETONESUR, PROTEINUR, UROBILINOGEN, NITRITE, LEUKOCYTESUR  Radiological Exams on Admission: DG Chest 2 View  Result Date: 07/02/2020 CLINICAL DATA:  Cough, congestion for 2 days EXAM: CHEST - 2 VIEW COMPARISON:  None. FINDINGS: The heart size and mediastinal contours are within normal limits. Diffusely coarsened interstitial markings. Streaky left basilar opacity. No pleural effusion or pneumothorax. The visualized skeletal structures are unremarkable. IMPRESSION: 1. Streaky left basilar opacity, which may reflect atelectasis versus infiltrate. 2. Diffusely coarsened interstitial markings, which may reflect bronchitic type lung changes. Electronically Signed   By: Duanne GuessNicholas  Plundo D.O.   On: 07/02/2020 12:39      Assessment/Plan  Sepsis secondary to community-acquired pneumonia Patient presented with tachycardia, tachypnea and elevated leukocytosis of 13.1 Remained stable on room air No IVF given since pt already edematous with stable BP Continue IV Rocephin and azithromycin  Hypertension continue home antihypertensives  Hypokalemia Continue daily potassium supplement  Chronic diastolic heart failure Stable.  Has significant bilateral lower extremity edema but suspect more due to lymphedema.  Hypothyroidism Continue levothyroxine  Chronic pain syndrome Continue home Nucynta  Morbid obesity BMI greater than 47  OSA Continue CPAP unfortunately no units available at this time  Cognitive impairment Continue home meds  DVT prophylaxis:.Lovenox Code Status: Full Family Communication: Plan discussed with patient at bedside  disposition Plan: Home with observation Consults called:  Admission status: Observation   Status is: Observation  The patient  remains OBS appropriate and will d/c before 2 midnights.  Dispo: The patient is from:  Home              Anticipated d/c is to: Home              Anticipated d/c date is: 1 day              Patient currently is not medically stable to d/c.         Anselm Jungling DO [facility]   If 7PM-7AM, please contact night-coverage [email or web address]   07/03/2020, 1:03 AM

## 2020-07-03 NOTE — Progress Notes (Signed)
PHARMACIST - PHYSICIAN COMMUNICATION  CONCERNING:  Enoxaparin (Lovenox) for DVT Prophylaxis    RECOMMENDATION: Patient was prescribed enoxaprin 40mg  q24 hours for VTE prophylaxis.   Filed Weights   07/02/20 1206  Weight: 134.7 kg (296 lb 15.4 oz)    Body mass index is 47.93 kg/m.  Estimated Creatinine Clearance: 87.2 mL/min (by C-G formula based on SCr of 0.61 mg/dL).   Based on [facility] policy patient is candidate for enoxaparin 0.5mg /kg TBW SQ every 24 hours based on BMI being >30.  DESCRIPTION: Pharmacy has adjusted enoxaparin dose per [facility] policy.  Patient is now receiving enoxaparin 70 mg every 24 hours    [facility], PharmD Clinical Pharmacist  07/03/2020 4:03 AM

## 2020-07-03 NOTE — ED Notes (Signed)
Receiving RN sent a message. Awaiting reply.

## 2020-07-03 NOTE — Progress Notes (Signed)
[facility] Progress Note  Patient: [name]    BEM:[id number]  DOA: 07/02/2020     Date of Service: the patient was seen and examined on 07/03/2020  Chief Complaint  Patient presents with  . Cough   Brief hospital course: [name] is a 75 y.o. 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  About 2 days ago she started to have a sore throat and felt congested.  The following day she began to have continuous coughing productive of sputum.  Also has lower rib/chest pain associated with her cough.  States she had a low-grade fever.  Has had decreased appetite.  Denies any nausea, vomiting or diarrhea.  Denies any tobacco use.  No sick contact.  She did not take any of her medications yesterday since she did not feel well.  ED Course: She was afebrile but tachycardic and tachypneic although stable on room air.  Also had elevated blood pressure up to 180/60s. CBC showed leukocytosis of 13.1.  Lactic acid of 1.1.  Hypokalemia 2.9.  CMP otherwise unremarkable. Negative influenza and COVID test. Chest x-ray showed possible left basilar infiltrate.  She was started on Rocephin and azithromycin in the ED.   Assessment and Plan: Sepsis secondary to community-acquired pneumonia Patient presented with tachycardia, tachypnea and elevated leukocytosis of 13.1 Remained stable on room air No IVF given since pt already edematous with stable BP Continue IV Rocephin and azithromycin Robitussin-DM twice daily and added Claritin due to cough and stuffy nose   Hypertension continue home antihypertensives  Hypokalemia Continue daily potassium supplement  Chronic diastolic heart failure Stable.  Has significant bilateral lower extremity edema but suspect more  due to lymphedema.  Hypothyroidism Continue levothyroxine  Chronic pain syndrome Continue home Nucynta   OSA Continue CPAP unfortunately no units available at this time  Cognitive impairment Continue home meds  Morbid obesity Body mass index is 47.93 kg/m.  Interventions: Calorie restricted diet and exercise advised to lose body weight.    Diet: Heart healthy diet DVT Prophylaxis: Subcutaneous Lovenox   Advance goals of care discussion: Full code  Family Communication: family was not present at bedside, at the time of interview.  The pt provided permission to discuss medical plan with the family. Opportunity was given to ask question and all questions were answered satisfactorily.   Disposition:  Pt is from Home, admitted with PNA, still has PNA, which precludes a safe discharge. Discharge to home, when pneumonia symptoms will improve, patient is still very short of breath on exertion.  Subjective: Events overnight events, patient is still having significant cough with phlegm production and very short of breath on exertion. Denies any chest pain or palpitations, no abdominal pain, no nausea vomiting or diarrhea.   Physical Exam: General:  alert oriented to time, place, and person.  Appear in mild distress, affect appropriate Eyes: PERRLA ENT: Oral Mucosa Clear, moist  Neck: no JVD,  Cardiovascular: S1 and S2 Present, no Murmur,  Respiratory: increased respiratory effort, Bilateral Air entry equal and Decreased, mild crackles bilaterally crackles, no significant wheezing appreciated Abdomen: Bowel Sound present, Soft and no tenderness,  Skin: no rashes Extremities: no Pedal edema, no calf tenderness Neurologic: without any new focal findings Gait not checked due to patient safety concerns  Vitals:   07/03/20 0957 07/03/20 1000 07/03/20 1522 07/03/20 1608  BP: (!) 153/85 (!) 150/76 (!) 160/102 (!) 142/88  Pulse: 94 98 92   Resp: 18 18 20    Temp: 98.4 F  (36.9 C) 98 F (36.7 C) 98.4 F (36.9 C)   TempSrc: Oral  Oral   SpO2: 96% 98% 98%   Weight:      Height:        Intake/Output Summary (Last 24 hours) at 07/03/2020 1627 Last data filed at 07/03/2020 1608 Gross per 24 hour  Intake 590 ml  Output 1900 ml  Net -1310 ml   Filed Weights   07/02/20 1206  Weight: 134.7 kg    Data Reviewed: I have personally reviewed and interpreted daily labs, tele strips, imagings as discussed above. I reviewed all nursing notes, pharmacy notes, vitals, pertinent old records I have discussed plan of care as described above with RN and patient/family.  CBC: Recent Labs  Lab 07/02/20 1428  WBC 13.1*  HGB 12.9  HCT 39.0  MCV 94.7  PLT 198   Basic Metabolic Panel: Recent Labs  Lab 07/02/20 1919 07/03/20 0931  NA 141  --   K 2.9*  --   CL 103  --   CO2 25  --   GLUCOSE 122*  --   BUN 6*  --   CREATININE 0.61  --   CALCIUM 9.7  --   MG  --  2.0  PHOS  --  3.1    Studies: No results found.  Scheduled Meds: . amantadine  100 mg Oral BID  . amLODipine  5 mg Oral Daily  . [START ON 07/04/2020] ARIPiprazole  2 mg Oral Daily  . aspirin EC  81 mg Oral Daily  . carvedilol  25 mg Oral BID WC  . dextromethorphan-guaiFENesin  1 tablet Oral BID  . dextrose      . donepezil  10 mg Oral QHS  . enoxaparin (LOVENOX) injection  70 mg Subcutaneous Q24H  . [START ON 07/04/2020] levothyroxine  112 mcg Oral Q0600  . loratadine  10 mg Oral Daily  . losartan  100 mg Oral Daily  . nystatin   Topical BID  . potassium chloride  40 mEq Oral Once  . [START ON 07/04/2020] topiramate  25 mg Oral Daily  . torsemide  20 mg Oral Daily  . traZODone  100 mg Oral QHS  . venlafaxine XR  75 mg Oral TID AC   Continuous Infusions: . sodium chloride 250 mL (07/03/20 1246)  . azithromycin    . cefTRIAXone (ROCEPHIN)  IV     PRN Meds: sodium chloride, acetaminophen, phenol, tapentadol  Time spent: 35 minutes  Author: 07/05/20. MD [facility] 07/03/2020 4:27 PM  To reach On-call, see care teams to locate the attending and reach out to them via [email or web address]. If 7PM-7AM, please contact night-coverage If you still have difficulty reaching the attending provider, please page the [facility] (Director on Call) for [facility] on amion for assistance.

## 2020-07-04 LAB — BASIC METABOLIC PANEL
Anion gap: 11 (ref 5–15)
BUN: 11 mg/dL (ref 8–23)
CO2: 26 mmol/L (ref 22–32)
Calcium: 9.4 mg/dL (ref 8.9–10.3)
Chloride: 104 mmol/L (ref 98–111)
Creatinine, Ser: 0.71 mg/dL (ref 0.44–1.00)
GFR, Estimated: >60 mL/min (ref >60)
Glucose, Bld: 105 mg/dL — ABNORMAL HIGH (ref 70–99)
Potassium: 3.5 mmol/L (ref 3.5–5.1)
Sodium: 141 mmol/L (ref 135–145)

## 2020-07-04 LAB — CBC
HCT: 37.8 % (ref 36.0–46.0)
Hemoglobin: 12.3 g/dL (ref 12.0–15.0)
MCH: 30.9 pg (ref 26.0–34.0)
MCHC: 32.5 g/dL (ref 30.0–36.0)
MCV: 95 fL (ref 80.0–100.0)
Platelets: 297 10*3/uL (ref 150–400)
RBC: 3.98 MIL/uL (ref 3.87–5.11)
RDW: 13.1 % (ref 11.5–15.5)
WBC: 8.7 10*3/uL (ref 4.0–10.5)
nRBC: 0 % (ref 0.0–0.2)

## 2020-07-04 LAB — MAGNESIUM: Magnesium: 1.8 mg/dL (ref 1.7–2.4)

## 2020-07-04 LAB — LEGIONELLA PNEUMOPHILA SEROGP 1 UR AG: L. pneumophila Serogp 1 Ur Ag: NEGATIVE

## 2020-07-04 LAB — PHOSPHORUS: Phosphorus: 3.5 mg/dL (ref 2.5–4.6)

## 2020-07-04 MED ORDER — FUROSEMIDE 10 MG/ML IJ SOLN
20.0000 mg | Freq: Once | INTRAMUSCULAR | Status: AC
  Administered 2020-07-05: 20 mg via INTRAVENOUS
  Filled 2020-07-04: qty 4

## 2020-07-04 MED ORDER — FUROSEMIDE 10 MG/ML IJ SOLN
40.0000 mg | Freq: Once | INTRAMUSCULAR | Status: AC
  Administered 2020-07-04: 40 mg via INTRAVENOUS
  Filled 2020-07-04: qty 4

## 2020-07-04 MED ORDER — POTASSIUM CHLORIDE CRYS ER 20 MEQ PO TBCR
40.0000 meq | EXTENDED_RELEASE_TABLET | Freq: Once | ORAL | Status: AC
  Administered 2020-07-04: 40 meq via ORAL
  Filled 2020-07-04: qty 2

## 2020-07-04 MED ORDER — GUAIFENESIN-DM 100-10 MG/5ML PO SYRP
5.0000 mL | ORAL_SOLUTION | ORAL | Status: DC | PRN
  Administered 2020-07-04 – 2020-07-06 (×8): 5 mL via ORAL
  Filled 2020-07-04 (×8): qty 5

## 2020-07-04 NOTE — Progress Notes (Signed)
Mobility Specialist - Progress Note   07/04/20 1300  Mobility  Activity Transferred:  Chair to bed  Level of Assistance Standby assist, set-up cues, supervision of patient - no hands on  Assistive Device None  Distance Ambulated (ft) 3 ft  Mobility Response Tolerated well  Mobility performed by Mobility specialist  $Mobility charge 1 Mobility    Pt was sitting in recliner upon arrival requesting return to bed. Pt politely declined use of RW and stated, "I can usually get up by myself." Pt stood with supervision. No LOB noted. Pt pivoted to bedside where she sat EOB and began c/o slight SOB. O2 sat at 93% at time of complaint. Pt needed minA to return to bed with support on LE. Pt was able to reposition self to [facility] using bed rails and 'boost' bed setting. Overall, pt tolerated session well. Pt was left in bed with all needs in reach and alarm set.    Filiberto Pinks Mobility Specialist 07/04/20, 2:03 PM

## 2020-07-04 NOTE — Progress Notes (Signed)
Attempted cpap with patient. Patient uses nasal pillows at home. Placed patient on small nasal mask. Patient refused. States cannot tolerate anything around her head. Patient refused.

## 2020-07-04 NOTE — Progress Notes (Signed)
[facility] Progress Note  Patient: [name]    LOV:[id number]  DOA: 07/02/2020     Date of Service: the patient was seen and examined on 07/04/2020  Chief Complaint  Patient presents with  . Cough   Brief hospital course: [name] is a 75 y.o. 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  About 2 days ago she started to have a sore throat and felt congested.  The following day she began to have continuous coughing productive of sputum.  Also has lower rib/chest pain associated with her cough.  States she had a low-grade fever.  Has had decreased appetite.  Denies any nausea, vomiting or diarrhea.  Denies any tobacco use.  No sick contact.  She did not take any of her medications yesterday since she did not feel well.  ED Course: She was afebrile but tachycardic and tachypneic although stable on room air.  Also had elevated blood pressure up to 180/60s. CBC showed leukocytosis of 13.1.  Lactic acid of 1.1.  Hypokalemia 2.9.  CMP otherwise unremarkable. Negative influenza and COVID test. Chest x-ray showed possible left basilar infiltrate.  She was started on Rocephin and azithromycin in the ED.   Assessment and Plan:  Sepsis secondary to community-acquired pneumonia Patient presented with tachycardia, tachypnea and elevated leukocytosis of 13.1 Remained stable on room air No IVF given since pt already edematous with stable BP Continue IV Rocephin and azithromycin Robitussin-DM twice daily and added Claritin due to cough and stuffy nose Continue incentive spirometry   Hypertension continue home antihypertensives  Hypokalemia, chronic POA Continue daily potassium supplement  Chronic diastolic heart failure Stable.  Has significant  bilateral lower extremity edema but suspect more due to lymphedema. Continue torsemide PTA dose 1/20 milligrams IV x1 dose given  Hypothyroidism Continue levothyroxine  Chronic pain syndrome Continue home Nucynta   OSA Continue CPAP unfortunately no units available at this time  Cognitive impairment Continue home meds  Morbid obesity Body mass index is 47.93 kg/m.  Interventions: Calorie restricted diet and exercise advised to lose body weight.    Diet: Heart healthy diet DVT Prophylaxis: Subcutaneous Lovenox   Advance goals of care discussion: Full code  Family Communication: family was not present at bedside, at the time of interview.  The pt provided permission to discuss medical plan with the family. Opportunity was given to ask question and all questions were answered satisfactorily.   Disposition:  Pt is from Home, admitted with PNA, still has PNA, hypoxic respiratory failure which precludes a safe discharge. Discharge to home, when pneumonia symptoms will improve, patient is still very short of breath on exertion and requiring supplemental O2 2 L via nasal cannula  Subjective: Overnight patient had an episode of hypoxia, severe cough which is improving with Robitussin as needed. Today patient is requiring supplemental O2 2 L via nasal cannula, denies any chest pain or palpitations still has shortness of breath which got worse last night   Physical Exam: General:  alert oriented to time, place, and person.  Appear in mild distress, affect appropriate Eyes: PERRLA ENT: Oral Mucosa Clear, moist  Neck: no JVD,  Cardiovascular: S1 and S2 Present, no Murmur,  Respiratory: increased respiratory effort, Bilateral Air entry equal and Decreased, mild crackles bilaterally crackles, no significant wheezing appreciated Abdomen: Bowel Sound present, Soft and no  tenderness,  Skin: no rashes Extremities: 2-3+ Pedal edema, no calf tenderness Neurologic: without any new  focal findings Gait not checked due to patient safety concerns  Vitals:   07/04/20 0534 07/04/20 0822 07/04/20 1117 07/04/20 1528  BP: (!) 143/76 (!) 169/97 (!) 143/81 (!) 150/85  Pulse: 92 91 88 83  Resp: 20 16 20 19   Temp: 98.6 F (37 C) 97.9 F (36.6 C) 98.2 F (36.8 C) 98.3 F (36.8 C)  TempSrc: Oral Oral Oral Oral  SpO2: 99% 98% 100% 99%  Weight:      Height:        Intake/Output Summary (Last 24 hours) at 07/04/2020 1604 Last data filed at 07/04/2020 1334 Gross per 24 hour  Intake 836.6 ml  Output 2250 ml  Net -1413.4 ml   Filed Weights   07/02/20 1206  Weight: 134.7 kg    Data Reviewed: I have personally reviewed and interpreted daily labs, tele strips, imagings as discussed above. I reviewed all nursing notes, pharmacy notes, vitals, pertinent old records I have discussed plan of care as described above with RN and patient/family.  CBC: Recent Labs  Lab 07/02/20 1428 07/04/20 0527  WBC 13.1* 8.7  HGB 12.9 12.3  HCT 39.0 37.8  MCV 94.7 95.0  PLT 198 297   Basic Metabolic Panel: Recent Labs  Lab 07/02/20 1919 07/03/20 0931 07/04/20 0527  NA 141  --  141  K 2.9*  --  3.5  CL 103  --  104  CO2 25  --  26  GLUCOSE 122*  --  105*  BUN 6*  --  11  CREATININE 0.61  --  0.71  CALCIUM 9.7  --  9.4  MG  --  2.0 1.8  PHOS  --  3.1 3.5    Studies: No results found.  Scheduled Meds: . amantadine  100 mg Oral BID  . amLODipine  5 mg Oral Daily  . ARIPiprazole  2 mg Oral Daily  . aspirin EC  81 mg Oral Daily  . carvedilol  25 mg Oral BID WC  . dextromethorphan-guaiFENesin  1 tablet Oral BID  . donepezil  10 mg Oral QHS  . enoxaparin (LOVENOX) injection  70 mg Subcutaneous Q24H  . furosemide  40 mg Intravenous Once  . levothyroxine  112 mcg Oral Q0600  . loratadine  10 mg Oral Daily  . losartan  100 mg Oral Daily  . nystatin   Topical BID  . potassium chloride  40 mEq Oral Once  . topiramate  25 mg Oral Daily  . torsemide  20 mg Oral Daily  .  traZODone  100 mg Oral QHS  . venlafaxine XR  75 mg Oral TID AC   Continuous Infusions: . sodium chloride Stopped (07/03/20 1331)  . azithromycin 500 mg (07/03/20 2059)  . cefTRIAXone (ROCEPHIN)  IV 2 g (07/03/20 2102)   PRN Meds: sodium chloride, acetaminophen, guaiFENesin-dextromethorphan, phenol, tapentadol  Time spent: 35 minutes  Author: 07/05/20. MD [facility] 07/04/2020 4:04 PM  To reach On-call, see care teams to locate the attending and reach out to them via [email or web address]/06/2020. If 7PM-7AM, please contact night-coverage If you still have difficulty reaching the attending provider, please page the [facility] (Director on Call) for [facility] on amion for assistance.

## 2020-07-04 NOTE — Progress Notes (Signed)
Mobility Specialist - Progress Note   07/04/20 1143  Mobility  Activity Ambulated in room  Level of Assistance Standby assist, set-up cues, supervision of patient - no hands on  Assistive Device Front wheel walker  Distance Ambulated (ft) 30 ft  Mobility Response Tolerated well  Mobility performed by Mobility specialist  $Mobility charge 1 Mobility    Pre-mobility: 86 HR, 98% SpO2 During mobility: 102 HR, 98% SpO2 Post-mobility: 95 HR, 100% SpO2   Pt sitting on recliner upon arrival. Pt agreed to session. Pt pleasant t/o session. Pt alert and oriented. Initially, pt S2S utilizing personal cane SBA. Pt utilizes cane at baseline. Pt states she feels "weak" and her legs feel "wobbly". Pt request to utilize RW this session. Pt ambulated 30' total in room utilizing RW w/ SBA. No LOB noted. No c/o pain or SOB. O2 sat > 97% t/o session. Pt on 2L O2 [place]. Post-ambulation, weaned pt to 1L O2 [place]. O2 sat 99-100%. Overall, pt tolerated session well. Pt left sitting on recliner w/ all needs placed in reach. Nurse notified.    Haizley Cannella Mobility Specialist  07/04/20, 11:47 AM

## 2020-07-04 NOTE — Progress Notes (Signed)
Telemetry expired. Dr Lucianne Muss gave the order to discontinue telemetry

## 2020-07-05 LAB — BASIC METABOLIC PANEL
Anion gap: 13 (ref 5–15)
BUN: 18 mg/dL (ref 8–23)
CO2: 25 mmol/L (ref 22–32)
Calcium: 9.3 mg/dL (ref 8.9–10.3)
Chloride: 99 mmol/L (ref 98–111)
Creatinine, Ser: 0.92 mg/dL (ref 0.44–1.00)
GFR, Estimated: >60 mL/min (ref >60)
Glucose, Bld: 98 mg/dL (ref 70–99)
Potassium: 3.4 mmol/L — ABNORMAL LOW (ref 3.5–5.1)
Sodium: 137 mmol/L (ref 135–145)

## 2020-07-05 LAB — CBC
HCT: 36.2 % (ref 36.0–46.0)
Hemoglobin: 12 g/dL (ref 12.0–15.0)
MCH: 30.8 pg (ref 26.0–34.0)
MCHC: 33.1 g/dL (ref 30.0–36.0)
MCV: 93.1 fL (ref 80.0–100.0)
Platelets: 311 10*3/uL (ref 150–400)
RBC: 3.89 MIL/uL (ref 3.87–5.11)
RDW: 13.2 % (ref 11.5–15.5)
WBC: 9.2 10*3/uL (ref 4.0–10.5)
nRBC: 0 % (ref 0.0–0.2)

## 2020-07-05 LAB — MAGNESIUM: Magnesium: 1.7 mg/dL (ref 1.7–2.4)

## 2020-07-05 LAB — PHOSPHORUS: Phosphorus: 4.3 mg/dL (ref 2.5–4.6)

## 2020-07-05 MED ORDER — FLUTICASONE FUROATE-VILANTEROL 100-25 MCG/INH IN AEPB
1.0000 | INHALATION_SPRAY | Freq: Every day | RESPIRATORY_TRACT | Status: DC
  Administered 2020-07-05 – 2020-07-06 (×2): 1 via RESPIRATORY_TRACT
  Filled 2020-07-05: qty 28

## 2020-07-05 MED ORDER — PANTOPRAZOLE SODIUM 40 MG PO TBEC
40.0000 mg | DELAYED_RELEASE_TABLET | Freq: Every day | ORAL | Status: DC
  Administered 2020-07-05 – 2020-07-06 (×2): 40 mg via ORAL
  Filled 2020-07-05 (×2): qty 1

## 2020-07-05 MED ORDER — POTASSIUM CHLORIDE CRYS ER 20 MEQ PO TBCR
40.0000 meq | EXTENDED_RELEASE_TABLET | Freq: Once | ORAL | Status: AC
  Administered 2020-07-05: 40 meq via ORAL
  Filled 2020-07-05: qty 2

## 2020-07-05 MED ORDER — PREDNISONE 20 MG PO TABS
40.0000 mg | ORAL_TABLET | Freq: Every day | ORAL | Status: DC
  Administered 2020-07-05 – 2020-07-06 (×2): 40 mg via ORAL
  Filled 2020-07-05 (×2): qty 2

## 2020-07-05 MED ORDER — MAGNESIUM OXIDE 400 (241.3 MG) MG PO TABS
400.0000 mg | ORAL_TABLET | Freq: Two times a day (BID) | ORAL | Status: DC
  Administered 2020-07-05 – 2020-07-06 (×3): 400 mg via ORAL
  Filled 2020-07-05 (×3): qty 1

## 2020-07-05 MED ORDER — IPRATROPIUM-ALBUTEROL 0.5-2.5 (3) MG/3ML IN SOLN
3.0000 mL | Freq: Four times a day (QID) | RESPIRATORY_TRACT | Status: DC
  Administered 2020-07-05 – 2020-07-06 (×5): 3 mL via RESPIRATORY_TRACT
  Filled 2020-07-05 (×5): qty 3

## 2020-07-05 MED ORDER — FUROSEMIDE 10 MG/ML IJ SOLN
40.0000 mg | Freq: Once | INTRAMUSCULAR | Status: AC
  Administered 2020-07-05: 40 mg via INTRAVENOUS
  Filled 2020-07-05: qty 4

## 2020-07-05 NOTE — Evaluation (Signed)
Occupational Therapy Evaluation Patient Details Name: [name] MRN: [id number] DOB: 01/22/1946 Today's Date: 07/05/2020    History of Present Illness 75 y.o. 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   Clinical Impression   Pt was seen for OT evaluation this date. Prior to hospital admission, pt was ambulating with a SPC, and requiring Min assist from her spouse for socks/shoes, and assist for donning/doffing her bra 2/2 bilateral shoulder issues, L>R. Pt lives with her spouse in a 1 story home with 5 steps to enter with bilateral rails (has a chair lift installed in the garage as well). Pt denies falls in past 12 months. Currently pt demonstrates impairments as described below (See OT problem list) which functionally limit her ability to perform ADL/self-care tasks. Pt currently requires Min A for LB ADL, CGA for ADL transfers using a RW. Pt/spouse instructed in incentive spirometer use and pt able to return demo proper technique after additional instruction. Pt would benefit from skilled OT services to address noted impairments and functional limitations (see below for any additional details) in order to maximize safety and independence while minimizing falls risk and caregiver burden. Upon hospital discharge, recommend HHOT to maximize pt safety and return to functional independence during meaningful occupations of daily life.     Follow Up Recommendations  Home health OT    Equipment Recommendations  None recommended by OT    Recommendations for Other Services       Precautions / Restrictions Precautions Precautions: Fall Restrictions Weight Bearing Restrictions: No      Mobility Bed Mobility               General bed mobility comments: deferred, in recliner at  start and end of session    Transfers Overall transfer level: Needs assistance Equipment used: Rolling walker (2 wheeled) Transfers: Sit to/from Stand Sit to Stand: Min guard         General transfer comment: increased time/effort to perform    Balance Overall balance assessment: Needs assistance Sitting-balance support: Single extremity supported;Feet supported Sitting balance-Leahy Scale: Good     Standing balance support: Bilateral upper extremity supported Standing balance-Leahy Scale: Fair                             ADL either performed or assessed with clinical judgement   ADL Overall ADL's : Needs assistance/impaired                                       General ADL Comments: CGA for ADL transfers, Min A for LB ADL tasks, fatigues quickly, limited by lumbar back pain     Vision Patient Visual Report: No change from baseline       Perception     Praxis      Pertinent Vitals/Pain Pain Assessment: 0-10 Pain Score: 9  Pain Location: lumbar/back Pain Descriptors / Indicators: Aching Pain Intervention(s): Limited activity within patient's tolerance;Monitored during session;Premedicated before session;Repositioned     Hand Dominance     Extremity/Trunk Assessment Upper Extremity Assessment Upper Extremity Assessment: Generalized weakness (bilat shoulder issues, L>R (pt states she needs shoulder replacement per ortho sx))   Lower Extremity Assessment Lower Extremity Assessment: Generalized weakness  Communication Communication Communication: No difficulties   Cognition Arousal/Alertness: Awake/alert Behavior During Therapy: WFL for tasks assessed/performed Overall Cognitive Status: Within Functional Limits for tasks assessed                                     General Comments  SpO2 >98% throughout, no SOB, on room air, HR up to 105 with ADL mobility    Exercises Other Exercises Other Exercises:  Pt/spouse instructed in incentive spirometer use, pt able to verbalize schedule for using; required additional instruction in technique   Shoulder Instructions      Home Living Family/patient expects to be discharged to:: Private residence Living Arrangements: Spouse/significant other Available Help at Discharge: Family;Available 24 hours/day Type of Home: House Home Access: Stairs to enter [employer] of Steps: 5; also has chair lift in garage Entrance Stairs-Rails: Right;Left;Can reach both Home Layout: One level     Bathroom Shower/Tub: Producer, television/film/video: Standard     Home Equipment: Environmental consultant - 4 wheels;Walker - standard;Cane - single point;Shower seat;Grab bars - tub/shower;Hand held shower head;Adaptive equipment Adaptive Equipment: Reacher;Sock aid;Long-handled shoe horn        Prior Functioning/Environment Level of Independence: Needs assistance  Gait / Transfers Assistance Needed: amb w/ SPC, denies falls in past 12 mo ADL's / Homemaking Assistance Needed: required minimal assist with LB dressing and donning/doffing bra (2/2 bilat shoulder issues), drives, indep with medication mgt            OT Problem List: Decreased strength;Decreased range of motion;Pain;Obesity;Decreased activity tolerance;Impaired balance (sitting and/or standing);Decreased knowledge of use of DME or AE;Impaired UE functional use      OT Treatment/Interventions: Self-care/ADL training;Therapeutic exercise;Therapeutic activities;DME and/or AE instruction;Energy conservation;Patient/family education;Balance training    OT Goals(Current goals can be found in the care plan section) Acute Rehab OT Goals Patient Stated Goal: to get better and go home OT Goal Formulation: With patient/family Time For Goal Achievement: 07/19/20 Potential to Achieve Goals: Good ADL Goals Pt Will Transfer to Toilet: with supervision;ambulating;regular height toilet (LRAD for amb) Additional  ADL Goal #1: Pt will verbalize plan to implement at least 1 learned falls prevention strategy. Additional ADL Goal #2: Pt will independently utilize incentive spirometer to maximize adherence to recovery  OT Frequency: Min 2X/week   Barriers to D/C:            Co-evaluation PT/OT/SLP Co-Evaluation/Treatment: Yes Reason for Co-Treatment: To address functional/ADL transfers;For patient/therapist safety PT goals addressed during session: Mobility/safety with mobility;Proper use of DME OT goals addressed during session: ADL's and self-care      AM-PAC OT "6 Clicks" Daily Activity     Outcome Measure Help from another person eating meals?: None Help from another person taking care of personal grooming?: None Help from another person toileting, which includes using toliet, bedpan, or urinal?: A Little Help from another person bathing (including washing, rinsing, drying)?: A Little Help from another person to put on and taking off regular upper body clothing?: None Help from another person to put on and taking off regular lower body clothing?: A Little 6 Click Score: 21   End of Session Equipment Utilized During Treatment: Gait belt;Rolling walker Nurse Communication: Other (comment) (new purewick)  Activity Tolerance: Patient tolerated treatment well Patient left: in chair;with call bell/phone within reach;with family/visitor present  OT Visit Diagnosis: Other abnormalities of gait and mobility (R26.89);Muscle weakness (generalized) (M62.81)  Time: 6568-1275 OT Time Calculation (min): 30 min Charges:  OT General Charges $OT Visit: 1 Visit OT Evaluation $OT Eval Moderate Complexity: 1 Mod OT Treatments $Self Care/Home Management : 8-22 mins  Richrd Prime, MPH, MS, OTR/L ascom [phone] 07/05/20, 3:04 PM

## 2020-07-05 NOTE — Evaluation (Signed)
Physical Therapy Evaluation Patient Details Name: [name] MRN: [id number] DOB: 1946-04-10 Today's Date: 07/05/2020   History of Present Illness  75 y.o. 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  Alert and oriented to basic information; follows commands and agreeable to participation with session. Rates pain in low back 9/10 (chronic, baseline), centralized and non-radiating.  Bilat UE/LE strength and ROM grossly symmetrical and WFL; no focal wekaness appreciated.  Able to complete sit/stand, static standing balance and gait (50') with RW, cga/close sup.  Demonstrates mild forward trunk flexion with heavy WBing on RW; fair step height/length; slow, guarded cadence with broad turning radius; no overt buckling or LOB.  BORG 6/10 after session; sats >92% on RA at rest and with exertion. Would benefit from skilled PT to address above deficits and promote optimal return to PLOF; Recommend transition to HHPT upon discharge from acute hospitalization.     Follow Up Recommendations Home health PT    Equipment Recommendations  Rolling walker with 5" wheels;3in1 (PT)    Recommendations for Other Services       Precautions / Restrictions Precautions Precautions: Fall Restrictions Weight Bearing Restrictions: No      Mobility  Bed Mobility               General bed mobility comments: seated in recliner beginning/end of treatment session    Transfers Overall transfer level: Needs assistance Equipment used: Rolling walker (2 wheeled) Transfers: Sit to/from Stand Sit to Stand: Min guard;Supervision         General  transfer comment: requires UE support to complete; broad BOS, use of momentum to move COG over BOS  Ambulation/Gait Ambulation/Gait assistance: Min guard;Supervision Gait Distance (Feet): 50 Feet Assistive device: Rolling walker (2 wheeled)       General Gait Details: mild forward trunk flexion with heavy WBing on RW; fair step height/length; slow, guarded cadence with broad turning radius; no overt buckling or LOB.  BORG 6/10 after session; sats >92% on RA at rest and with exertion  Stairs            Wheelchair Mobility    Modified Rankin (Stroke Patients Only)       Balance Overall balance assessment: Needs assistance Sitting-balance support: No upper extremity supported;Feet supported Sitting balance-Leahy Scale: Good     Standing balance support: Bilateral upper extremity supported Standing balance-Leahy Scale: Fair                               Pertinent Vitals/Pain Pain Assessment: 0-10 Pain Score: 9  Pain Location: low back (centralized without radicular symptoms) Pain Descriptors / Indicators: Aching Pain Intervention(s): Limited activity within patient's tolerance;Monitored during session;Repositioned    Home Living Family/patient expects to be discharged to:: Private residence Living Arrangements: Spouse/significant other Available Help at Discharge: Family;Available 24 hours/day Type of Home: House Home Access: Stairs to enter Entrance Stairs-Rails: Right;Left;Can reach both Entrance Stairs-Number of Steps: 5; also has chair lift in garage   Home Equipment: Walker - 4 wheels;Walker - standard;Cane - single point;Shower seat;Grab bars - tub/shower;Hand held shower head;Adaptive equipment      Prior Function Level of Independence: Needs  assistance   Gait / Transfers Assistance Needed: amb w/ SPC, denies falls in past 12 mo  ADL's / Homemaking Assistance Needed: required minimal assist with LB dressing and donning/doffing bra (2/2 bilat  shoulder issues), drives, indep with medication mgt        Hand Dominance        Extremity/Trunk Assessment   Upper Extremity Assessment Upper Extremity Assessment: Overall WFL for tasks assessed    Lower Extremity Assessment Lower Extremity Assessment: Overall WFL for tasks assessed (grossly 4- to 4/5 throughout)       Communication   Communication: No difficulties  Cognition Arousal/Alertness: Awake/alert Behavior During Therapy: WFL for tasks assessed/performed Overall Cognitive Status: Within Functional Limits for tasks assessed                                        General Comments      Exercises     Assessment/Plan    PT Assessment Patient needs continued PT services  PT Problem List Decreased strength;Decreased activity tolerance;Decreased balance;Decreased mobility;Obesity;Cardiopulmonary status limiting activity;Decreased knowledge of use of DME;Decreased safety awareness;Decreased knowledge of precautions       PT Treatment Interventions DME instruction;Gait training;Functional mobility training;Therapeutic activities;Therapeutic exercise;Balance training;Patient/family education    PT Goals (Current goals can be found in the Care Plan section)  Acute Rehab PT Goals Patient Stated Goal: to get better and go home Time For Goal Achievement: 07/19/20 Potential to Achieve Goals: Good    Frequency Min 2X/week   Barriers to discharge        Co-evaluation   Reason for Co-Treatment: For patient/therapist safety;To address functional/ADL transfers PT goals addressed during session: Mobility/safety with mobility OT goals addressed during session: ADL's and self-care;Proper use of Adaptive equipment and DME       AM-PAC PT "6 Clicks" Mobility  Outcome Measure Help needed turning from your back to your side while in a flat bed without using bedrails?: A Little Help needed moving from lying on your back to sitting on the side of a flat  bed without using bedrails?: A Little Help needed moving to and from a bed to a chair (including a wheelchair)?: A Little Help needed standing up from a chair using your arms (e.g., wheelchair or bedside chair)?: A Little Help needed to walk in hospital room?: A Little Help needed climbing 3-5 steps with a railing? : A Little 6 Click Score: 18    End of Session Equipment Utilized During Treatment: Gait belt Activity Tolerance: Patient tolerated treatment well Patient left: in chair;with call bell/phone within reach;with chair alarm set Nurse Communication: Mobility status PT Visit Diagnosis: Muscle weakness (generalized) (M62.81);Difficulty in walking, not elsewhere classified (R26.2)    Time: 0932-6712 PT Time Calculation (min) (ACUTE ONLY): 14 min   Charges:   PT Evaluation $PT Eval Moderate Complexity: 1 Mod          Peggyann Zwiefelhofer H. Manson Passey, PT, DPT, NCS 07/05/20, 8:01 PM [phone]

## 2020-07-05 NOTE — TOC Initial Note (Signed)
Transition of Care [facility]) - Initial/Assessment Note    Patient Details  Name: [name] MRN: [id number] Date of Birth: 1945/10/01  Transition of Care [facility]) CM/SW Contact:    Chapman Fitch, RN Phone Number: 07/05/2020, 4:13 PM  Clinical Narrative:                 Patient admitted from home with sepsis. Husband at bedside Patient lives at home with husband  PCP Dareen Piano - patient states at baseline she is able to drive herself.   Pharmacy [facility].  Denies issues obtaining medications  Patient states that she has a cane, RW, standard walker, lift chair, shower seat, and chair lift in the home  PT and OT have assessed patient and recommends home health  Patient in agreement and states that she does not have a preference of agency.  Referral made and accepted by [facility] with [facility].  MD notified of orders needed for discharge  Expected Discharge Plan: Home w Home Health Services     Patient Goals and CMS Choice   CMS Medicare.gov Compare Post Acute Care list provided to:: Patient    Expected Discharge Plan and Services Expected Discharge Plan: Home w Home Health Services   Discharge Planning Services: CM Consult Post Acute Care Choice: Home Health Living arrangements for the past 2 months: Single Family Home                           HH Arranged: RN,PT,OT HH Agency: [facility] Date HH Agency Contacted: 07/05/20   Representative spoke with at [facility] Agency: Barbara Cower  Prior Living Arrangements/Services Living arrangements for the past 2 months: Single Family Home Lives with:: Spouse Patient language and need for interpreter reviewed:: Yes Do you feel safe going back to the place where you live?: Yes      Need for Family Participation in Patient Care: Yes (Comment) Care giver support system in place?: Yes (comment) Current home services: DME Criminal Activity/Legal Involvement Pertinent to Current  Situation/Hospitalization: No - Comment as needed  Activities of Daily Living Home Assistive Devices/Equipment: Cane (specify quad or straight) ADL Screening (condition at time of admission) Patient's cognitive ability adequate to safely complete daily activities?: Yes Is the patient deaf or have difficulty hearing?: No Does the patient have difficulty seeing, even when wearing glasses/contacts?: No Does the patient have difficulty concentrating, remembering, or making decisions?: No Patient able to express need for assistance with ADLs?: Yes Does the patient have difficulty dressing or bathing?: No Independently performs ADLs?: Yes (appropriate for developmental age) Does the patient have difficulty walking or climbing stairs?: No Weakness of Legs: Both Weakness of Arms/Hands: None  Permission Sought/Granted                  Emotional Assessment       Orientation: : Oriented to Self,Oriented to Place,Oriented to  Time,Oriented to Situation   Psych Involvement: No (comment)  Admission diagnosis:  Sepsis (HCC) [A41.9] Community acquired pneumonia of left lower lobe of lung [J18.9] Sepsis, due to unspecified organism, unspecified whether acute organ dysfunction present (HCC) [A41.9] CAP (community acquired pneumonia) [J18.9] Patient Active Problem List   Diagnosis Date Noted   Community acquired pneumonia 07/03/2020   OSA (obstructive sleep apnea) 07/03/2020   CAP (community acquired pneumonia) 07/03/2020   Sepsis (HCC) 07/02/2020   History of anesthesia reaction 02/20/2020   Swelling of limb 02/20/2020   Chronic diastolic CHF (  congestive heart failure) (HCC) 08/22/2019   Edema 07/29/2019   Prediabetes 07/07/2019   Depression 04/26/2019   Disease of thyroid gland 04/26/2019   GERD (gastroesophageal reflux disease) 04/26/2019   History of head injury 04/26/2019   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/26/2019   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   Spasm of  lumbar paraspinous muscle 02/14/2019   Chronic pain syndrome 02/14/2019   Use of cane as ambulatory aid 01/04/2019   Acneiform eruption 07/09/2017   Lumbar spondylosis 04/20/2017   Lumbar facet arthropathy 04/20/2017   Lumbar degenerative disc disease 04/20/2017   Anxiety 11/05/2016   Facet arthritis of lumbar region 09/16/2016   Low back pain 07/25/2016   Morbid obesity with BMI of 40.0-44.9, adult (HCC) 06/03/2016   Acquired hypothyroidism 03/04/2016   Cognitive deficit as late effect of traumatic brain injury (HCC) 03/04/2016   Health care maintenance 03/04/2016   HTN, goal below 140/90 03/04/2016   Hypokalemia 03/04/2016   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   Localized primary osteoarthritis of lower leg, right 08/21/2015   Primary osteoarthritis of left knee 04/10/2015   Right knee pain 01/18/2013   PCP:  Lauro Regulus, MD Pharmacy:   [id number] [place], [place] - [address] AT [facility] OF [address] [place] [address] Phone: [phone] Fax: [phone]  [id number] - [place], [place] - [address] AT [facility] [address] [facility] [address] [place] [address] Phone: [phone] Fax: [phone]     Social Determinants of Health (SDOH) Interventions    Readmission Risk Interventions No flowsheet data found.

## 2020-07-05 NOTE — Progress Notes (Signed)
[facility] Progress Note  Patient: [name]    OFB:[id number]  DOA: 07/02/2020     Date of Service: the patient was seen and examined on 07/05/2020  Chief Complaint  Patient presents with  . Cough   Brief hospital course: [name] is a 75 y.o. 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  About 2 days ago she started to have a sore throat and felt congested.  The following day she began to have continuous coughing productive of sputum.  Also has lower rib/chest pain associated with her cough.  States she had a low-grade fever.  Has had decreased appetite.  Denies any nausea, vomiting or diarrhea.  Denies any tobacco use.  No sick contact.  She did not take any of her medications yesterday since she did not feel well.  ED Course: She was afebrile but tachycardic and tachypneic although stable on room air.  Also had elevated blood pressure up to 180/60s. CBC showed leukocytosis of 13.1.  Lactic acid of 1.1.  Hypokalemia 2.9.  CMP otherwise unremarkable. Negative influenza and COVID test. Chest x-ray showed possible left basilar infiltrate.  She was started on Rocephin and azithromycin in the ED.   Assessment and Plan:  Sepsis secondary to community-acquired pneumonia Patient presented with tachycardia, tachypnea and elevated leukocytosis of 13.1 Remained stable on room air No IVF given since pt already edematous with stable BP Continue IV Rocephin and azithromycin Robitussin-DM twice daily and added Claritin due to cough and stuffy nose Continue incentive spirometry   Acute hypoxemic failure secondary to CAP and possible COPD Continue supplemental O2 initially and gradually wean off 1/21 was wheezing, started Earlie Server, DuoNeb every 6  hourly scheduled was changed to as needed after improvement, prednisone 40 mg p.o. daily for 5 days Lasix 40 mg IV x1 dose given to diurese Continue fluid restriction 1.5 L/day   Hypertension continue home antihypertensives  Hypokalemia, chronic POA Continue daily potassium supplement  Chronic diastolic heart failure Stable.  Has significant bilateral lower extremity edema but suspect more due to lymphedema. Continue torsemide PTA dose 1/21 Lasix 40 mg IV x1 dose given  Hypothyroidism Continue levothyroxine  Chronic pain syndrome Continue home Nucynta   OSA Continue CPAP unfortunately no units available at this time  Cognitive impairment Continue home meds  Morbid obesity Body mass index is 47.93 kg/m.  Interventions: Calorie restricted diet and exercise advised to lose body weight.    Diet: Heart healthy diet DVT Prophylaxis: Subcutaneous Lovenox   Advance goals of care discussion: Full code  Family Communication: family was not present at bedside, at the time of interview.  The pt provided permission to discuss medical plan with the family. Opportunity was given to ask question and all questions were answered satisfactorily.   Disposition:  Pt is from Home, admitted with PNA, still has PNA, hypoxic respiratory failure which precludes a safe discharge. Discharge to home, when pneumonia symptoms will improve, patient is still very short of breath on exertion and requiring supplemental O2 2 L via nasal cannula PT and OT consulted, most likely patient will be discharged home tomorrow   Subjective: Significant overnight events, patient is still having shortness of breath and requiring supplemental O2 1 L Patient is still having cough with phlegm production. RN was advised to check O2 saturation on room air  and ambulate patient to see ambulatory oxygen saturation before discharge planning PT and OT consulted, most likely patient will be discharged home  tomorrow    Physical Exam: General:  alert oriented to time, place, and person.  Appear in mild distress, affect appropriate Eyes: PERRLA ENT: Oral Mucosa Clear, moist  Neck: no JVD,  Cardiovascular: S1 and S2 Present, no Murmur,  Respiratory: increased respiratory effort, Bilateral Air entry equal and Decreased, mild crackles bilaterally crackles, bilateral wheezing  Abdomen: Bowel Sound present, Soft and no tenderness,  Skin: no rashes Extremities: 2+ Pedal edema, no calf tenderness Neurologic: without any new focal findings Gait not checked due to patient safety concerns  Vitals:   07/05/20 0049 07/05/20 0428 07/05/20 0428 07/05/20 0740  BP: (!) 148/86 136/74 136/74 (!) 151/84  Pulse: 86 86 86 87  Resp: 20 20 20 20   Temp: 98.4 F (36.9 C) 98.6 F (37 C) 98.6 F (37 C) 97.6 F (36.4 C)  TempSrc: Oral Oral Oral Oral  SpO2: 98% 95% 95% 98%  Weight:      Height:        Intake/Output Summary (Last 24 hours) at 07/05/2020 1408 Last data filed at 07/05/2020 1016 Gross per 24 hour  Intake 980.72 ml  Output 1000 ml  Net -19.28 ml   Filed Weights   07/02/20 1206  Weight: 134.7 kg    Data Reviewed: I have personally reviewed and interpreted daily labs, tele strips, imagings as discussed above. I reviewed all nursing notes, pharmacy notes, vitals, pertinent old records I have discussed plan of care as described above with RN and patient/family.  CBC: Recent Labs  Lab 07/02/20 1428 07/04/20 0527 07/05/20 0540  WBC 13.1* 8.7 9.2  HGB 12.9 12.3 12.0  HCT 39.0 37.8 36.2  MCV 94.7 95.0 93.1  PLT 198 297 311   Basic Metabolic Panel: Recent Labs  Lab 07/02/20 1919 07/03/20 0931 07/04/20 0527 07/05/20 0540  NA 141  --  141 137  K 2.9*  --  3.5 3.4*  CL 103  --  104 99  CO2 25  --  26 25  GLUCOSE 122*  --  105* 98  BUN 6*  --  11 18  CREATININE 0.61  --  0.71 0.92  CALCIUM 9.7  --  9.4 9.3  MG  --  2.0 1.8 1.7  PHOS  --  3.1 3.5 4.3    Studies: No  results found.  Scheduled Meds: . amantadine  100 mg Oral BID  . amLODipine  5 mg Oral Daily  . ARIPiprazole  2 mg Oral Daily  . aspirin EC  81 mg Oral Daily  . carvedilol  25 mg Oral BID WC  . dextromethorphan-guaiFENesin  1 tablet Oral BID  . donepezil  10 mg Oral QHS  . enoxaparin (LOVENOX) injection  70 mg Subcutaneous Q24H  . fluticasone furoate-vilanterol  1 puff Inhalation Daily  . furosemide  40 mg Intravenous Once  . ipratropium-albuterol  3 mL Nebulization Q6H  . levothyroxine  112 mcg Oral Q0600  . loratadine  10 mg Oral Daily  . losartan  100 mg Oral Daily  . magnesium oxide  400 mg Oral BID  . nystatin   Topical BID  . pantoprazole  40 mg Oral Daily  . potassium chloride  40 mEq Oral Once  . predniSONE  40 mg Oral Q breakfast  . topiramate  25 mg Oral Daily  . torsemide  20 mg Oral Daily  . traZODone  100 mg Oral QHS  . venlafaxine XR  75 mg Oral TID AC   Continuous Infusions: . sodium chloride Stopped (07/04/20 1937)  . azithromycin Stopped (07/04/20 1851)  . cefTRIAXone (ROCEPHIN)  IV Stopped (07/04/20 1749)   PRN Meds: sodium chloride, acetaminophen, guaiFENesin-dextromethorphan, phenol, tapentadol  Time spent: 35 minutes  Author: Gillis Santa. MD [facility] 07/05/2020 2:08 PM  To reach On-call, see care teams to locate the attending and reach out to them via [email or web address]. If 7PM-7AM, please contact night-coverage If you still have difficulty reaching the attending provider, please page the [facility] (Director on Call) for [facility] on amion for assistance.

## 2020-07-05 NOTE — Care Management Important Message (Signed)
Important Message  Patient Details  Name: [name] MRN: [id number] Date of Birth: 11/04/1945   Medicare Important Message Given:  Yes     [name] 07/05/2020, 12:56 PM

## 2020-07-06 LAB — CBC
HCT: 33 % — ABNORMAL LOW (ref 36.0–46.0)
Hemoglobin: 11.5 g/dL — ABNORMAL LOW (ref 12.0–15.0)
MCH: 31.4 pg (ref 26.0–34.0)
MCHC: 34.8 g/dL (ref 30.0–36.0)
MCV: 90.2 fL (ref 80.0–100.0)
Platelets: 331 10*3/uL (ref 150–400)
RBC: 3.66 MIL/uL — ABNORMAL LOW (ref 3.87–5.11)
RDW: 12.8 % (ref 11.5–15.5)
WBC: 10.5 10*3/uL (ref 4.0–10.5)
nRBC: 0 % (ref 0.0–0.2)

## 2020-07-06 LAB — BASIC METABOLIC PANEL
Anion gap: 12 (ref 5–15)
BUN: 25 mg/dL — ABNORMAL HIGH (ref 8–23)
CO2: 26 mmol/L (ref 22–32)
Calcium: 9 mg/dL (ref 8.9–10.3)
Chloride: 98 mmol/L (ref 98–111)
Creatinine, Ser: 0.99 mg/dL (ref 0.44–1.00)
GFR, Estimated: 60 mL/min — ABNORMAL LOW (ref >60)
Glucose, Bld: 95 mg/dL (ref 70–99)
Potassium: 4.1 mmol/L (ref 3.5–5.1)
Sodium: 136 mmol/L (ref 135–145)

## 2020-07-06 LAB — MAGNESIUM: Magnesium: 2 mg/dL (ref 1.7–2.4)

## 2020-07-06 LAB — PHOSPHORUS: Phosphorus: 3 mg/dL (ref 2.5–4.6)

## 2020-07-06 MED ORDER — ENOXAPARIN SODIUM 80 MG/0.8ML ~~LOC~~ SOLN: 0.5000 mg/kg | SUBCUTANEOUS | Status: DC

## 2020-07-06 MED ORDER — AMOXICILLIN-POT CLAVULANATE 875-125 MG PO TABS: 1.0000 | ORAL_TABLET | Freq: Two times a day (BID) | ORAL | 0 refills | Status: AC

## 2020-07-06 MED ORDER — PREDNISONE 20 MG PO TABS: 40.0000 mg | ORAL_TABLET | Freq: Every day | ORAL | 0 refills | Status: AC

## 2020-07-06 MED ORDER — GUAIFENESIN-DM 100-10 MG/5ML PO SYRP: 5.0000 mL | ORAL_SOLUTION | ORAL | 0 refills | Status: DC | PRN

## 2020-07-06 MED ORDER — NYSTATIN 100000 UNIT/GM EX POWD: Freq: Two times a day (BID) | CUTANEOUS | 0 refills | Status: DC

## 2020-07-06 MED ORDER — FLUTICASONE FUROATE-VILANTEROL 100-25 MCG/INH IN AEPB: 1.0000 | INHALATION_SPRAY | Freq: Every day | RESPIRATORY_TRACT | 0 refills | Status: DC

## 2020-07-06 NOTE — Evaluation (Signed)
Clinical/Bedside Swallow Evaluation Patient Details  Name: [name] MRN: [id number] Date of Birth: 07/10/1945  Today's Date: 07/06/2020 Time: SLP Start Time (ACUTE ONLY): 1230 SLP Stop Time (ACUTE ONLY): 1300 SLP Time Calculation (min) (ACUTE ONLY): 30 min  Past Medical History:  Past Medical History:  Diagnosis Date  . Benign positional vertigo 03/04/2016  . Brain injury (HCC)   . Climacteric   . Depression   . 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 Dyslipidemia   . GERD (gastroesophageal reflux disease)   . Hypertension   . Hypokalemia   . Hypothyroidism   . IBS (irritable bowel syndrome)   . OAB (overactive bladder)   . Ovarian cyst 03/25/2015   3cm simple cyst on right ovary  . Rhinitis   . Sleep apnea   . Spasm of thoracolumbar muscle    Past Surgical History:  Past Surgical History:  Procedure Laterality Date  . ABDOMINAL HYSTERECTOMY  07/12/2014  . ANKLE RECONSTRUCTION Left   . BRACHIOPLASTY    . CATARACT EXTRACTION    . CHOLECYSTECTOMY    . COLONOSCOPY WITH PROPOFOL N/A 12/07/2016   Procedure: COLONOSCOPY WITH PROPOFOL;  Surgeon: Scot Jun, MD;  Location: [facility] ENDOSCOPY;  Service: Endoscopy;  Laterality: N/A;  . JOINT REPLACEMENT Bilateral 07/2014, 03/2015  . nasal reconstrucion    . REDUCTION MAMMAPLASTY Bilateral   . SHOULDER OPEN ROTATOR CUFF REPAIR    . SOFT TISSUE TUMOR RESECTION     excision tumor soft tissue neck, and soft tissue subcutaneous axilla  . TOE SURGERY Right    reconstruction right foot 4th and 5th toe   HPI:  Per admitting H&P "[name] is a 75 y.o. 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 Pt presents with functional swallowing abilities at bedside with no overt s/s of aspiration with any tested consistency. Oral mech exam revealed structures to be functioning adequately. Pt was able to masticate solids without difficulty and presented with only minimal residue after the swallow. Vocal quality was weak and hoarse but remained clear with Po's. Laryngeal elevation appeared adequate. Rec continue with Regular diet, planned for discharge home today. No ST needs at discharge. SLP Visit Diagnosis: Dysphagia, oropharyngeal phase (R13.12)    Aspiration Risk  Mild aspiration risk    Diet Recommendation Regular   Liquid Administration via: Cup;Straw Medication Administration: Whole meds with liquid Postural Changes: Seated upright at 90 degrees;Remain upright for at least 30 minutes after po intake    Other  Recommendations   PT/OT following  Follow up Recommendations None      Frequency and Duration   N/A         Prognosis   N/A     Swallow Study   General Date of Onset: 07/02/20 HPI: Per admitting H&P "[name] is a 75 y.o. 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, chronic pain syndrome, hypothyroidism, and morbid obesity who presents with concerns of persistent cough and shortness of breath." Type of Study: Bedside Swallow Evaluation Diet Prior to this Study: Regular Temperature Spikes Noted: No Respiratory Status: Room  air History of Recent Intubation: No Behavior/Cognition: Alert;Cooperative;Pleasant mood Oral Cavity Assessment: Within Functional Limits Oral Care Completed by SLP: No Oral Cavity - Dentition: Adequate natural dentition Vision: Functional for self-feeding Self-Feeding Abilities: Able to feed self Patient Positioning: Upright in chair Baseline Vocal Quality: Hoarse;Breathy;Low vocal  intensity    Oral/Motor/Sensory Function Overall Oral Motor/Sensory Function: Within functional limits   Ice Chips Ice chips: Within functional limits Presentation: Spoon   Thin Liquid Thin Liquid: Within functional limits Presentation: Cup;Straw;Spoon    Nectar Thick Nectar Thick Liquid: Not tested   Honey Thick Honey Thick Liquid: Not tested   Puree Puree: Within functional limits   Solid     Solid: Within functional limits Presentation: Self Fed      [name] 07/06/2020,1:09 PM

## 2020-07-06 NOTE — TOC Progression Note (Addendum)
Transition of Care [facility]) - Progression Note    Patient Details  Name: [name] MRN: [id number] Date of Birth: [birth year]  [facility] CM/SW Contact  Bing Quarry, RN Phone Number: 07/06/2020, 9:49 AM  Clinical Narrative:   07/06/20 0950   Possible discharge today HH with PT/OT. PT recommends RW & 3 in 1.   CM reached out to DME agencies this am to see when can be delivered due to current weather/road conditions this am.   No word back yet.   Please message or call CM [phone] is dc is planned today.   HH arranged via [facility] per prior CM on 1/21.   Do not see DME agency do have contacted [employer] this am.   Will continue to monitor.  Gabriel Cirri RN [facility] [phone]   1218 [employer] confirmed acceptance of DME delivery as patient is being discharged today per provider. Gabriel Cirri RN CM   Expected Discharge Plan: Home w Home Health Services    Expected Discharge Plan and Services Expected Discharge Plan: Home w Home Health Services   Discharge Planning Services: CM Consult Post Acute Care Choice: Home Health Living arrangements for the past 2 months: Single Family Home                           HH Arranged: RN,PT,OT HH Agency: [facility] Date HH Agency Contacted: 07/05/20   Representative spoke with at [facility] Agency: Ryer Asato Cower   Social Determinants of Health (SDOH) Interventions    Readmission Risk Interventions No flowsheet data found.

## 2020-07-06 NOTE — Progress Notes (Signed)
[name] to be D/C'd home per MD order.  Discussed prescriptions and follow up appointments with the patient. Prescriptions given to patient, medication list explained in detail. Pt verbalized understanding.  Allergies as of 07/06/2020       Reactions   Codeine Other (See Comments)   Seizure. Tolerates tramadol   Povidone Iodine Shortness Of Breath   Patient had difficulty breathing during procedure where dura prep was used to clean back.    Hydromorphone Hives, Itching, Other (See Comments)   Severe itching   Latex Itching, Other (See Comments)   blisters   Oxycodone-acetaminophen Nausea And Vomiting   Sulfa Antibiotics Itching, Swelling   Swelling eyes, hands, feet. Was at home and didn't go to the hospital   Bupropion Other (See Comments)   Gabapentin Other (See Comments)   tremors   Hydrocodone    Tolerates tramadol   Morphine And Related Itching, Rash   Procaine         Medication List     STOP taking these medications    doxycycline 100 MG capsule Commonly known as: VIBRAMYCIN       TAKE these medications    acetaminophen 500 MG tablet Commonly known as: TYLENOL Take 500 mg by mouth every 6 (six) hours as needed.   albuterol 0.63 MG/3ML nebulizer solution Commonly known as: ACCUNEB albuterol sulfate HFA 90 mcg/actuation aerosol inhaler  INHALE 2 INHALATIONS INTO THE LUNGS EVERY 6 HOURS AS NEEDED FOR WHEEZING   amantadine 100 MG capsule Commonly known as: SYMMETREL Take 100 mg by mouth 2 (two) times daily.   amLODipine 5 MG tablet Commonly known as: NORVASC Take 5 mg by mouth daily.   amoxicillin-clavulanate 875-125 MG tablet Commonly known as: Augmentin Take 1 tablet by mouth 2 (two) times daily for 5 days.   ARIPiprazole 2 MG tablet Commonly known as: ABILIFY Take by mouth.   aspirin EC 81 MG tablet Take 81 mg by mouth daily.   azelastine 0.1 % nasal spray Commonly known as: ASTELIN 2 sprays as needed for rhinitis. Use  in each nostril as directed   baclofen 20 MG tablet Commonly known as: LIORESAL Take 20 mg by mouth daily as needed for muscle spasms.   Biotin 10 MG Caps Take by mouth daily.   BOTOX IJ Inject every 4 (four) months as directed.   carvedilol 25 MG tablet Commonly known as: COREG Take 50 mg by mouth 2 (two) times daily with a meal.   celecoxib 200 MG capsule Commonly known as: CELEBREX Take 200 mg by mouth 2 (two) times daily.   donepezil 10 MG tablet Commonly known as: ARICEPT Take 10 mg by mouth at bedtime.   esomeprazole 40 MG capsule Commonly known as: NEXIUM Take 40 mg by mouth daily.   fluticasone 50 MCG/ACT nasal spray Commonly known as: FLONASE fluticasone propionate 50 mcg/actuation nasal spray,suspension  SHAKE LIQUID AND USE 1 SPRAY IN EACH NOSTRIL TWICE DAILY   fluticasone furoate-vilanterol 100-25 MCG/INH Aepb Commonly known as: BREO ELLIPTA Inhale 1 puff into the lungs daily. Start taking on: July 07, 2020   folic acid 400 MCG tablet Commonly known as: FOLVITE Take 400 mcg by mouth at bedtime. 4 tabs at bedtime   guaiFENesin-dextromethorphan 100-10 MG/5ML syrup Commonly known as: ROBITUSSIN DM Take 5 mLs by mouth every 4 (four) hours as needed for cough (chest congestion).   levothyroxine 112 MCG tablet Commonly known as: SYNTHROID Take 112 mcg by mouth daily.   losartan 100 MG  tablet Commonly known as: COZAAR Take 100 mg by mouth daily.   magnesium 30 MG tablet Take 30 mg by mouth daily.   melatonin 3 MG Tabs tablet Take 1 tablet at bedtime by mouth.   montelukast 10 MG tablet Commonly known as: SINGULAIR Take 10 mg by mouth every evening.   multivitamin tablet Take 1 tablet by mouth daily.   nystatin powder Commonly known as: MYCOSTATIN/NYSTOP Apply topically 2 (two) times daily.   potassium chloride 10 MEQ tablet Commonly known as: KLOR-CON potassium chloride ER 10 mEq tablet,extended release  TAKE 1 TABLET BY MOUTH DAILY  ALONG WITH HIGHER DOSE TORSEMIDE AS NEEDED FOR SWELLING   potassium chloride SA 20 MEQ tablet Commonly known as: KLOR-CON Take 20 mEq by mouth 3 (three) times daily.   predniSONE 20 MG tablet Commonly known as: DELTASONE Take 2 tablets (40 mg total) by mouth daily with breakfast for 3 days. Start taking on: July 07, 2020   tapentadol 50 MG tablet Commonly known as: NUCYNTA Take 1 tablet (50 mg total) by mouth daily as needed for moderate pain or severe pain. For chronic pain syndrome   tapentadol 50 MG tablet Commonly known as: NUCYNTA Take 1 tablet (50 mg total) by mouth daily as needed for moderate pain or severe pain. For chronic pain syndrome Start taking on: July 25, 2020   topiramate 25 MG tablet Commonly known as: TOPAMAX Take 25 mg by mouth daily.   torsemide 10 MG tablet Commonly known as: DEMADEX Take 10 mg by mouth daily.   traZODone 50 MG tablet Commonly known as: DESYREL Take 100 mg by mouth at bedtime. 50-150mg  nightly as needed   VENLAFAXINE HCL PO Take 75 mg by mouth 3 (three) times daily.   vitamin C 1000 MG tablet Take 1,000 mg daily by mouth.   Vitamin D3 50 MCG (2000 UT) Tabs Take 2,000 Units daily by mouth.   zinc gluconate 50 MG tablet Take 50 mg by mouth daily.               Durable Medical Equipment  (From admission, onward)           Start     Ordered   07/06/20 1215  For home use only DME 3 n 1  Once        07/06/20 1216   07/06/20 1215  For home use only DME Walker rolling  Once       Question Answer Comment  Walker: With 5 Inch Wheels   Patient needs a walker to treat with the following condition Generalized weakness      07/06/20 1216            Vitals:   07/06/20 0812 07/06/20 1113  BP: (!) 143/70 133/72  Pulse: 89 90  Resp: 18 19  Temp: 98.1 F (36.7 C) 98.2 F (36.8 C)  SpO2: 97% 94%    Skin clean, dry and intact without evidence of skin break down, no evidence of skin tears noted. IV catheter  discontinued intact. Site without signs and symptoms of complications. Dressing and pressure applied. Pt denies pain at this time. No complaints noted.  An After Visit Summary was printed and given to the patient. Patient escorted via WC, and D/C home via private auto by husband.  [name] A [name]

## 2020-07-06 NOTE — Discharge Summary (Signed)
[facility] Discharge Summary   Patient: [name] [facility] [name] FYB:[id number]  PCP: Lauro Regulus, MD  Date of admission: 07/02/2020   Date of discharge:  07/06/2020     Discharge Diagnoses:  Principal Problem:   Sepsis [facility]) Active Problems:   Chronic pain syndrome   Acquired hypothyroidism   Chronic diastolic CHF (congestive heart failure) (HCC)   History of head injury   HTN, goal below 140/90   Hypokalemia   Morbid obesity with BMI of 40.0-44.9, adult (HCC)   Community acquired pneumonia   OSA (obstructive sleep apnea)   CAP (community acquired pneumonia)   Admitted From: Home Disposition:  Home   Recommendations for Outpatient Follow-up:  1. PCP: in 1 wk 2. Follow up LABS/TEST:  CXR in 4 wks   Follow-up Information    Schedule an appointment as soon as possible for a visit  with Lauro Regulus, MD.   Specialty: Internal Medicine Contact information: [address] [facility] [place] [address] [phone]              Diet recommendation: Cardiac diet  Activity: The patient is advised to gradually reintroduce usual activities, as tolerated  Discharge Condition: stable  Code Status: Full code   History of present illness: As per the H and P dictated on admission.  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 About 2 days ago she started to have a sore throat and felt congested. The following day she began to have continuous coughing productive of sputum. Also has lower rib/chest pain associated with her cough. States she had a low-grade fever. Has had decreased appetite. Denies any nausea, vomiting or diarrhea. Denies any tobacco use. No sick contact.  She did not take any of her medications yesterday since she did not feel well. ED Course:She was afebrile but tachycardic and tachypneic although stable on room air. Also had elevated blood pressure up to 180/60s. CBC showed leukocytosis of 13.1. Lactic acid of 1.1. Hypokalemia 2.9. CMP otherwise unremarkable. Negative influenza and COVID test. Chest x-ray showed possible left basilar infiltrate. She was started on Rocephin and azithromycin in the ED.   Assessment and Plan: Sepsis secondary to community-acquired pneumonia Patient presented with tachycardia, tachypnea and elevated leukocytosis of 13.1, patient developed hypoxia remained on diluted oxygen which was gradually weaned off currently on room air. No IVF given since pt already edematous with stable BP S/p IV Rocephin and azithromycin, started Augmentin twice daily for 5 additional days on discharge.  Patient was advised to continue Robitussin-DM as needed.  Continue incentive spirometry.  Follow with PCP and repeat chest x-ray after 4 weeks for resolution of pneumonia. Acute hypoxemic failure secondary to CAP and possible COPD, patient required supplemental O2 inhalation which was weaned off gradually.  Currently saturating well on room air. 1/21 was wheezing, started [facility], s/p DuoNeb every 6 hourly scheduled. prednisone 40 mg p.o. daily for 5 days, s/p Lasix 40 mg IV x1 dose given to diurese. Continue fluid restriction 1.5 L/day.  Advised to continue Breo Ellipta, albuterol as needed and prednisone for total 5 days course Hypertension, continue home antihypertensives Hypokalemia, chronic POA, Continue daily potassium supplement Chronic diastolic heart failure, Stable. Has significant bilateral lower extremity edema but suspect more due to lymphedema. Continue torsemide PTA  dose, 1/21 Lasix 40 mg IV x1 dose given.  Continue fluid restriction 1.5 L/day. Hypothyroidism, Continue levothyroxine Chronic pain syndrome Continue home  Nucynta OSA Continue CPAP unfortunately no units available at this time Cognitive impairment, Continue home meds Fungal infection in the groin area, started on nystatin powder.  Follow with PCP. Morbid obesity Body mass index is 47.93 kg/m.  Interventions: Calorie restricted diet and exercise advised to lose body weight.   Overall condition improved, medically optimized and cleared for discharge home.  Patient agreed with the discharge planning.  Recommended to follow with PCP to reduce polypharmacy.  Patient is on so many medications.    Patient was ambulatory with assistance. Patient was seen by physical therapy, who recommended Home health,  which was arranged. On the day of the discharge the patient's vitals were stable, and no other acute medical condition were reported by patient. the patient was felt safe to be discharge at Home with Home health.  Consultants: None Procedures: None  Discharge Exam: General: Appear in  distress, no Rash; Oral Mucosa Clear, moist. Cardiovascular: S1 and S2 Present, no Murmur, Respiratory: normal respiratory effort, Bilateral Air entry present and mild bibasilar crackles crackles, minimal wheezing bilaterally, much improved as compared to yesterday. Abdomen: Bowel Sound present, Soft and no tenderness, no hernia, fungal infection in the groin area Extremities: 2+ Pedal edema, no calf tenderness Neurology: alert and oriented to time, place, and person affect appropriate.  Filed Weights   07/02/20 1206  Weight: 134.7 kg   Vitals:   07/06/20 0812 07/06/20 1113  BP: (!) 143/70 133/72  Pulse: 89 90  Resp: 18 19  Temp: 98.1 F (36.7 C) 98.2 F (36.8 C)  SpO2: 97% 94%    DISCHARGE MEDICATION: Allergies as of 07/06/2020      Reactions   Codeine Other (See Comments)   Seizure. Tolerates tramadol   Povidone Iodine Shortness Of Breath   Patient had difficulty breathing during procedure where dura prep was used to clean back.     Hydromorphone Hives, Itching, Other (See Comments)   Severe itching   Latex Itching, Other (See Comments)   blisters   Oxycodone-acetaminophen Nausea And Vomiting   Sulfa Antibiotics Itching, Swelling   Swelling eyes, hands, feet. Was at home and didn't go to the hospital   Bupropion Other (See Comments)   Gabapentin Other (See Comments)   tremors   Hydrocodone    Tolerates tramadol   Morphine And Related Itching, Rash   Procaine       Medication List    STOP taking these medications   doxycycline 100 MG capsule Commonly known as: VIBRAMYCIN     TAKE these medications   acetaminophen 500 MG tablet Commonly known as: TYLENOL Take 500 mg by mouth every 6 (six) hours as needed.   albuterol 0.63 MG/3ML nebulizer solution Commonly known as: ACCUNEB albuterol sulfate HFA 90 mcg/actuation aerosol inhaler  INHALE 2 INHALATIONS INTO THE LUNGS EVERY 6 HOURS AS NEEDED FOR WHEEZING   amantadine 100 MG capsule Commonly known as: SYMMETREL Take 100 mg by mouth 2 (two) times daily.   amLODipine 5 MG tablet Commonly known as: NORVASC Take 5 mg by mouth daily.   amoxicillin-clavulanate 875-125 MG tablet Commonly known as: Augmentin Take 1 tablet by mouth 2 (two) times daily for 5 days.   ARIPiprazole 2 MG tablet Commonly known as: ABILIFY Take by mouth.   aspirin EC 81 MG tablet Take 81 mg by mouth daily.   azelastine 0.1 %  nasal spray Commonly known as: ASTELIN 2 sprays as needed for rhinitis. Use in each nostril as directed   baclofen 20 MG tablet Commonly known as: LIORESAL Take 20 mg by mouth daily as needed for muscle spasms.   Biotin 10 MG Caps Take by mouth daily.   BOTOX IJ Inject every 4 (four) months as directed.   carvedilol 25 MG tablet Commonly known as: COREG Take 50 mg by mouth 2 (two) times daily with a meal.   celecoxib 200 MG capsule Commonly known as: CELEBREX Take 200 mg by mouth 2 (two) times daily.   donepezil 10 MG tablet Commonly known  as: ARICEPT Take 10 mg by mouth at bedtime.   esomeprazole 40 MG capsule Commonly known as: NEXIUM Take 40 mg by mouth daily.   fluticasone 50 MCG/ACT nasal spray Commonly known as: FLONASE fluticasone propionate 50 mcg/actuation nasal spray,suspension  SHAKE LIQUID AND USE 1 SPRAY IN EACH NOSTRIL TWICE DAILY   fluticasone furoate-vilanterol 100-25 MCG/INH Aepb Commonly known as: BREO ELLIPTA Inhale 1 puff into the lungs daily. Start taking on: July 07, 2020   folic acid 400 MCG tablet Commonly known as: FOLVITE Take 400 mcg by mouth at bedtime. 4 tabs at bedtime   guaiFENesin-dextromethorphan 100-10 MG/5ML syrup Commonly known as: ROBITUSSIN DM Take 5 mLs by mouth every 4 (four) hours as needed for cough (chest congestion).   levothyroxine 112 MCG tablet Commonly known as: SYNTHROID Take 112 mcg by mouth daily.   losartan 100 MG tablet Commonly known as: COZAAR Take 100 mg by mouth daily.   magnesium 30 MG tablet Take 30 mg by mouth daily.   melatonin 3 MG Tabs tablet Take 1 tablet at bedtime by mouth.   montelukast 10 MG tablet Commonly known as: SINGULAIR Take 10 mg by mouth every evening.   multivitamin tablet Take 1 tablet by mouth daily.   nystatin powder Commonly known as: MYCOSTATIN/NYSTOP Apply topically 2 (two) times daily.   potassium chloride 10 MEQ tablet Commonly known as: KLOR-CON potassium chloride ER 10 mEq tablet,extended release  TAKE 1 TABLET BY MOUTH DAILY ALONG WITH HIGHER DOSE TORSEMIDE AS NEEDED FOR SWELLING   potassium chloride SA 20 MEQ tablet Commonly known as: KLOR-CON Take 20 mEq by mouth 3 (three) times daily.   predniSONE 20 MG tablet Commonly known as: DELTASONE Take 2 tablets (40 mg total) by mouth daily with breakfast for 3 days. Start taking on: July 07, 2020   tapentadol 50 MG tablet Commonly known as: NUCYNTA Take 1 tablet (50 mg total) by mouth daily as needed for moderate pain or severe pain. For chronic  pain syndrome   tapentadol 50 MG tablet Commonly known as: NUCYNTA Take 1 tablet (50 mg total) by mouth daily as needed for moderate pain or severe pain. For chronic pain syndrome Start taking on: July 25, 2020   topiramate 25 MG tablet Commonly known as: TOPAMAX Take 25 mg by mouth daily.   torsemide 10 MG tablet Commonly known as: DEMADEX Take 10 mg by mouth daily.   traZODone 50 MG tablet Commonly known as: DESYREL Take 100 mg by mouth at bedtime. 50-150mg  nightly as needed   VENLAFAXINE HCL PO Take 75 mg by mouth 3 (three) times daily.   vitamin C 1000 MG tablet Take 1,000 mg daily by mouth.   Vitamin D3 50 MCG (2000 UT) Tabs Take 2,000 Units daily by mouth.   zinc gluconate 50 MG tablet Take 50 mg by mouth daily.  Allergies  Allergen Reactions  . Codeine Other (See Comments)    Seizure. Tolerates tramadol  . Povidone Iodine Shortness Of Breath    Patient had difficulty breathing during procedure where dura prep was used to clean back.   . Hydromorphone Hives, Itching and Other (See Comments)    Severe itching  . Latex Itching and Other (See Comments)    blisters  . Oxycodone-Acetaminophen Nausea And Vomiting  . Sulfa Antibiotics Itching and Swelling    Swelling eyes, hands, feet. Was at home and didn't go to the hospital  . Bupropion Other (See Comments)  . Gabapentin Other (See Comments)    tremors  . Hydrocodone     Tolerates tramadol  . Morphine And Related Itching and Rash  . Procaine    Discharge Instructions    Diet - low sodium heart healthy   Complete by: As directed    Discharge instructions   Complete by: As directed    Follow with PCP in 1 week, repeat chest x-ray after 4 weeks for resolution of pneumonia. Patient can follow-up with pulmonologist for PFTs for possibility of COPD   Increase activity slowly   Complete by: As directed       The results of significant diagnostics from this hospitalization (including imaging,  microbiology, ancillary and laboratory) are listed below for reference.    Significant Diagnostic Studies: DG Chest 2 View  Result Date: 07/02/2020 CLINICAL DATA:  Cough, congestion for 2 days EXAM: CHEST - 2 VIEW COMPARISON:  None. FINDINGS: The heart size and mediastinal contours are within normal limits. Diffusely coarsened interstitial markings. Streaky left basilar opacity. No pleural effusion or pneumothorax. The visualized skeletal structures are unremarkable. IMPRESSION: 1. Streaky left basilar opacity, which may reflect atelectasis versus infiltrate. 2. Diffusely coarsened interstitial markings, which may reflect bronchitic type lung changes. Electronically Signed   By: Duanne GuessNicholas  Plundo D.O.   On: 07/02/2020 12:39    Microbiology: Recent Results (from the past 240 hour(s))  Resp Panel by RT-PCR (Flu A&B, Covid) Nasopharyngeal Swab     Status: None   Collection Time: 07/02/20  2:29 PM   Specimen: Nasopharyngeal Swab; Nasopharyngeal(NP) swabs in vial transport medium  Result Value Ref Range Status   SARS Coronavirus 2 by RT PCR NEGATIVE NEGATIVE Final    Comment: (NOTE) SARS-CoV-2 target nucleic acids are NOT DETECTED.  The SARS-CoV-2 RNA is generally detectable in upper respiratory specimens during the acute phase of infection. The lowest concentration of SARS-CoV-2 viral copies this assay can detect is 138 copies/mL. A negative result does not preclude SARS-Cov-2 infection and should not be used as the sole basis for treatment or other patient management decisions. A negative result may occur with  improper specimen collection/handling, submission of specimen other than nasopharyngeal swab, presence of viral mutation(s) within the areas targeted by this assay, and inadequate number of viral copies(<138 copies/mL). A negative result must be combined with clinical observations, patient history, and epidemiological information. The expected result is Negative.  Fact Sheet for  Patients:  BloggerCourse.comhttps://www.fda.gov/media/152166/download  Fact Sheet for Healthcare Providers:  SeriousBroker.ithttps://www.fda.gov/media/152162/download  This test is no t yet approved or cleared by the [place] FDA and  has been authorized for detection and/or diagnosis of SARS-CoV-2 by FDA under an Emergency Use Authorization (EUA). This EUA will remain  in effect (meaning this test can be used) for the duration of the COVID-19 declaration under Section 564(b)(1) of the Act, 21 U.S.C.section 360bbb-3(b)(1), unless the authorization is terminated  or revoked sooner.  Influenza A by PCR NEGATIVE NEGATIVE Final   Influenza B by PCR NEGATIVE NEGATIVE Final    Comment: (NOTE) The Xpert Xpress SARS-CoV-2/FLU/RSV plus assay is intended as an aid in the diagnosis of influenza from Nasopharyngeal swab specimens and should not be used as a sole basis for treatment. Nasal washings and aspirates are unacceptable for Xpert Xpress SARS-CoV-2/FLU/RSV testing.  Fact Sheet for Patients: BloggerCourse.com  Fact Sheet for Healthcare Providers: SeriousBroker.it  This test is not yet approved or cleared by the [place] FDA and has been authorized for detection and/or diagnosis of SARS-CoV-2 by FDA under an Emergency Use Authorization (EUA). This EUA will remain in effect (meaning this test can be used) for the duration of the COVID-19 declaration under Section 564(b)(1) of the Act, 21 U.S.C. section 360bbb-3(b)(1), unless the authorization is terminated or revoked.  Performed at [facility], [address] [place], [place] [address]   Culture, blood (Routine X 2) w Reflex to ID Panel     Status: None (Preliminary result)   Collection Time: 07/02/20  8:59 PM   Specimen: BLOOD  Result Value Ref Range Status   Specimen Description BLOOD LEFT ARM  Final   Special Requests   Final    BOTTLES DRAWN AEROBIC AND ANAEROBIC Blood Culture  adequate volume   Culture   Final    NO GROWTH 4 DAYS Performed at [facility], [address] [place], [place] [address]    Report Status PENDING  Incomplete  Culture, blood (Routine X 2) w Reflex to ID Panel     Status: None (Preliminary result)   Collection Time: 07/02/20  9:00 PM   Specimen: BLOOD  Result Value Ref Range Status   Specimen Description BLOOD RIGHT ARM  Final   Special Requests   Final    BOTTLES DRAWN AEROBIC AND ANAEROBIC Blood Culture adequate volume   Culture   Final    NO GROWTH 4 DAYS Performed at [facility], [address] [place], [place] [address]    Report Status PENDING  Incomplete     Labs: CBC: Recent Labs  Lab 07/02/20 1428 07/04/20 0527 07/05/20 0540 07/06/20 0519  WBC 13.1* 8.7 9.2 10.5  HGB 12.9 12.3 12.0 11.5*  HCT 39.0 37.8 36.2 33.0*  MCV 94.7 95.0 93.1 90.2  PLT 198 297 311 331   Basic Metabolic Panel: Recent Labs  Lab 07/02/20 1919 07/03/20 0931 07/04/20 0527 07/05/20 0540 07/06/20 0519  NA 141  --  141 137 136  K 2.9*  --  3.5 3.4* 4.1  CL 103  --  104 99 98  CO2 25  --  26 25 26   GLUCOSE 122*  --  105* 98 95  BUN 6*  --  11 18 25*  CREATININE 0.61  --  0.71 0.92 0.99  CALCIUM 9.7  --  9.4 9.3 9.0  MG  --  2.0 1.8 1.7 2.0  PHOS  --  3.1 3.5 4.3 3.0   Liver Function Tests: Recent Labs  Lab 07/02/20 1919  AST 25  ALT 21  ALKPHOS 80  BILITOT 0.9  PROT 7.7  ALBUMIN 3.7   No results for input(s): LIPASE, AMYLASE in the last 168 hours. No results for input(s): AMMONIA in the last 168 hours. Cardiac Enzymes: No results for input(s): CKTOTAL, CKMB, CKMBINDEX, TROPONINI in the last 168 hours. BNP (last 3 results) Recent Labs    07/02/20 1919  BNP 54.7   CBG: No results for input(s):  GLUCAP in the last 168 hours.  Time spent: 35 minutes  Signed:  Gillis SantaDileep Jalyiah Shelley  [facility]  07/06/2020 12:09 PM

## 2020-07-07 LAB — CULTURE, BLOOD (ROUTINE X 2)
Culture: NO GROWTH
Culture: NO GROWTH
Special Requests: ADEQUATE
Special Requests: ADEQUATE

## 2020-07-10 ENCOUNTER — Ambulatory Visit: Payer: Medicare Other | Admitting: Student in an Organized Health Care Education/Training Program

## 2020-07-22 ENCOUNTER — Encounter: Payer: Self-pay | Admitting: Student in an Organized Health Care Education/Training Program

## 2020-07-22 ENCOUNTER — Ambulatory Visit
Payer: Medicare Other | Attending: Student in an Organized Health Care Education/Training Program | Admitting: Student in an Organized Health Care Education/Training Program

## 2020-07-22 ENCOUNTER — Other Ambulatory Visit: Payer: Self-pay

## 2020-07-22 VITALS — BP 150/80 | HR 88 | Temp 97.3°F | Resp 16 | Ht 66.0 in | Wt 308.0 lb

## 2020-07-22 DIAGNOSIS — G894 Chronic pain syndrome: Secondary | ICD-10-CM | POA: Diagnosis present

## 2020-07-22 DIAGNOSIS — M47816 Spondylosis without myelopathy or radiculopathy, lumbar region: Secondary | ICD-10-CM | POA: Diagnosis present

## 2020-07-22 NOTE — Progress Notes (Signed)
PROVIDER NOTE: Information contained herein reflects review and annotations entered in association with encounter. Interpretation of such information and data should be left to medically-trained personnel. Information provided to patient can be located elsewhere in the medical record under "Patient Instructions". Document created using STT-dictation technology, any transcriptional errors that may result from process are unintentional.    Patient: [name]  Service Category: E/M  Provider: Gillis Santa, MD  DOB: Aug 31, 1945  DOS: 07/22/2020  Specialty: Interventional Pain Management  MRN: [id number]  Setting: Ambulatory outpatient  PCP: Kirk Ruths, MD  Type: Established Patient    Referring Provider: Kirk Ruths, MD  Location: Office  Delivery: Face-to-face     HPI  [name], a 75 y.o. year old female, is here today because of her Lumbar facet arthropathy [M47.816]. [name]'s primary complain today is Back Pain (lower) Last encounter: My last encounter with her was on 06/19/2020. Pertinent problems: [name] has Lumbar spondylosis; Lumbar facet arthropathy; Lumbar degenerative disc disease; Spasm of lumbar paraspinous muscle; and Chronic pain syndrome on their pertinent problem list. Pain Assessment: Severity of Chronic pain is reported as a 4 /10. Location: Back Lower/denies. Onset: More than a month ago. Quality: Aching. Timing: Constant. Modifying factor(s): heat, sitting, lying. Vitals:  height is _0  (1.676 m) and weight is 308 lb (139.7 kg) (abnormal). Her temporal temperature is 97.3 F (36.3 C) (abnormal). Her blood pressure is 150/80 (abnormal) and her pulse is 88. Her respiration is 16 and oxygen saturation is 100%.   Reason for encounter: follow-up evaluation   Patient was scheduled today to have her left L4 Sprint peripheral nerve stimulation lead removed.  This was placed on 05/15/2020.  This in  fact was reinserted as the patient's previously had come out.  Even today, she states that her lead came out last week.  I evaluated the tip which she brought in and it was intact.  She states that she is still having moderate left-sided low back pain that was not very responsive to medial branch peripheral nerve stimulation.  Patient is requesting a lumbar radiofrequency ablation on the left side.  Her previous left lumbar RFA was on 12/04/2019 which provided her with 60 to 70% pain relief for approximately 4 months.  During this time she also utilized less Nucynta.  Given that she did not respond to her Sprint peripheral nerve stimulation of the left L4 medial branch, plan for left L3, L4, L5 RFA which she has found therapeutic and functional benefit from in the past.  Risks and benefits reviewed and patient would like to proceed.  Pharmacotherapy Assessment   Analgesic: Nucynta 50 mg daily, quantity 30/month.     Monitoring: [place] PMP: PDMP reviewed during this encounter.       Pharmacotherapy: No side-effects or adverse reactions reported. Compliance: No problems identified. Effectiveness: Clinically acceptable.  Landis Martins, RN  07/22/2020 10:09 AM  Sign when Signing Visit Safety precautions to be maintained throughout the outpatient stay will include: orient to surroundings, keep bed in low position, maintain call bell within reach at all times, provide assistance with transfer out of bed and ambulation.     UDS:  Summary  Date Value Ref Range Status  06/22/2019 Note  Final    Comment:    ==================================================================== [employer] [facility] (MW) ==================================================================== Test  Result       Flag       Units Drug Present and Declared for Prescription Verification   Tapentadol                     4009         EXPECTED   ng/mg creat    Source of tapentadol is a scheduled prescription  medication. Drug Absent but Declared for Prescription Verification   Clonazepam                     Not Detected UNEXPECTED ng/mg creat ==================================================================== Test                      Result    Flag   Units      Ref Range   Creatinine              81               mg/dL      >=20 ==================================================================== Declared Medications:  The flagging and interpretation on this report are based on the  following declared medications.  Unexpected results may arise from  inaccuracies in the declared medications.  **Note: The testing scope of this panel includes these medications:  Clonazepam  Tapentadol  **Note: The testing scope of this panel does not include the  following reported medications:  Acetaminophen  Amantadine  Amlodipine  Aspirin  Azelastine  Baclofen  Botulinum (Botox)  Carvedilol  Celecoxib  Cholecalciferol  Donepezil  Esomeprazole  Folic Acid  Levothyroxine  Losartan  Melatonin  Montelukast  Multivitamin  Potassium (Klor-Con)  Prednisone  Torsemide  Trazodone  Turmeric  Venlafaxine  Vitamin B  Vitamin C ==================================================================== For clinical consultation, please call [phone]. ====================================================================      ROS  Constitutional: Denies any fever or chills Gastrointestinal: No reported hemesis, hematochezia, vomiting, or acute GI distress Musculoskeletal: Denies any acute onset joint swelling, redness, loss of ROM, or weakness Neurological: No reported episodes of acute onset apraxia, aphasia, dysarthria, agnosia, amnesia, paralysis, loss of coordination, or loss of consciousness  Medication Review  ARIPiprazole, Biotin, OnabotulinumtoxinA, Venlafaxine HCl, Vitamin D3, acetaminophen, albuterol, amLODipine, amantadine, aspirin EC, azelastine, baclofen, carvedilol, celecoxib,  donepezil, esomeprazole, fluticasone, fluticasone furoate-vilanterol, folic acid, guaiFENesin-dextromethorphan, levothyroxine, losartan, magnesium, melatonin, montelukast, multivitamin, nystatin, potassium chloride, potassium chloride SA, tapentadol, topiramate, torsemide, traZODone, vitamin C, and zinc gluconate  History Review  Allergy: [name] is allergic to codeine, povidone iodine, hydromorphone, latex, oxycodone-acetaminophen, sulfa antibiotics, bupropion, gabapentin, hydrocodone, morphine and related, and procaine. Drug: [name]  reports no history of drug use. Alcohol:  reports current alcohol use of about 2.0 standard drinks of alcohol per week. Tobacco:  reports that she has never smoked. She has never used smokeless tobacco. Social: [name]  reports that she has never smoked. She has never used smokeless tobacco. She reports current alcohol use of about 2.0 standard drinks of alcohol per week. She reports that she does not use drugs. 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 Surgical: [name]  has a past surgical history that includes Ankle reconstruction (Left); Brachioplasty; Cataract extraction; Cholecystectomy; Soft Tissue Tumor Resection; Abdominal hysterectomy (07/12/2014); Joint replacement (Bilateral, 07/2014,  03/2015); nasal reconstrucion; Toe Surgery (Right); Shoulder open rotator cuff repair; Colonoscopy with propofol (N/A, 12/07/2016); and Reduction mammaplasty (Bilateral). Family: family history includes Breast cancer in her paternal aunt; Lung cancer in her  father and mother.  Laboratory Chemistry Profile   Renal Lab Results  Component Value Date   BUN 25 (H) 07/06/2020   CREATININE 0.99 07/06/2020   GFRNONAA 60 (L) 07/06/2020     Hepatic Lab Results  Component Value Date   AST 25 07/02/2020   ALT 21 07/02/2020   ALBUMIN 3.7 07/02/2020   ALKPHOS 80 07/02/2020     Electrolytes Lab Results  Component Value Date   NA 136 07/06/2020   K 4.1 07/06/2020   CL 98 07/06/2020   CALCIUM 9.0 07/06/2020   MG 2.0 07/06/2020   PHOS 3.0 07/06/2020     Bone No results found for: VD25OH, [id number], VZ5638VF6, EP3295JO8, 25OHVITD1, 25OHVITD2, 25OHVITD3, TESTOFREE, TESTOSTERONE   Inflammation (CRP: Acute Phase) (ESR: Chronic Phase) Lab Results  Component Value Date   LATICACIDVEN 1.1 07/02/2020       Note: Above Lab results reviewed.  Recent Imaging Review  DG Chest 2 View CLINICAL DATA:  Cough, congestion for 2 days  EXAM: CHEST - 2 VIEW  COMPARISON:  None.  FINDINGS: The heart size and mediastinal contours are within normal limits. Diffusely coarsened interstitial markings. Streaky left basilar opacity. No pleural effusion or pneumothorax. The visualized skeletal structures are unremarkable.  IMPRESSION: 1. Streaky left basilar opacity, which may reflect atelectasis versus infiltrate. 2. Diffusely coarsened interstitial markings, which may reflect bronchitic type lung changes.  Electronically Signed   By: Davina Poke D.O.   On: 07/02/2020 12:39 Note: Reviewed        Physical Exam  General appearance: Well nourished, well developed, and well hydrated. In no apparent acute distress Mental status: Alert, oriented x 3 (person, place, & time)       Respiratory: No evidence of acute respiratory distress Eyes: PERLA Vitals: BP (!) 150/80 (BP Location: Right Arm, Patient Position: Sitting, Cuff Size: Large)   Pulse 88   Temp (!) 97.3 F (36.3 C) (Temporal)   Resp 16   Ht _0  (1.676 m)   Wt (!) 308 lb (139.7  kg)   SpO2 100%   BMI 49.71 kg/m  BMI: Estimated body mass index is 49.71 kg/m as calculated from the following:   Height as of this encounter: _1  (1.676 m).   Weight as of this encounter: 308 lb (139.7 kg). Ideal: Ideal body weight: 59.3 kg (130 lb 11.7 oz) Adjusted ideal body weight: 91.5 kg (201 lb 10.2 oz)  Lumbar Exam  Skin & Axial Inspection: No masses, redness, or swelling Alignment: Symmetrical Functional ROM: Pain restricted ROM however improved after physical therapy       Stability: No instability detected Muscle Tone/Strength: Functionally intact. No obvious neuro-muscular anomalies detected. Sensory (Neurological): Musculoskeletal pain pattern Palpation: No palpable anomalies       Provocative Tests: Hyperextension/rotation test: Positive bilaterally for facet joint pain. Lumbar quadrant test (Kemp's test): (+) bilaterally for facet joint pain. L>R  Sprint peripheral nerve stimulator lead intact in inbox.  Was dislodged last week prior to removal.  Gait & Posture Assessment  Ambulation: Patient ambulates using a cane Gait: Limited. Using assistive device to ambulate Posture: Difficulty standing up straight, due to pain   Lower Extremity Exam    Side: Right lower extremity  Side: Left lower extremity  Stability: No instability observed  Stability: No instability observed          Skin & Extremity Inspection: Skin color, temperature, and hair growth are WNL. No peripheral edema or cyanosis. No masses, redness, swelling, asymmetry, or associated skin lesions. No contractures.  Skin & Extremity Inspection: Skin color, temperature, and hair growth are WNL. No peripheral edema or cyanosis. No masses, redness, swelling, asymmetry, or associated skin lesions. No contractures.  Functional ROM: Pain restricted ROM for hip and knee joints          Functional ROM: Pain restricted ROM for hip and knee joints          Muscle Tone/Strength: Functionally intact. No  obvious neuro-muscular anomalies detected.  Muscle Tone/Strength: Functionally intact. No obvious neuro-muscular anomalies detected.  Sensory (Neurological): Unimpaired        Sensory (Neurological): Unimpaired        DTR: Patellar: deferred today Achilles: deferred today Plantar: deferred today  DTR: Patellar: deferred today Achilles: deferred today Plantar: deferred today  Palpation: No palpable anomalies  Palpation: No palpable anomalies     Assessment   Status Diagnosis  Persistent Persistent Persistent 1. Lumbar facet arthropathy   2. Lumbar spondylosis   3. Chronic pain syndrome      Plan of Care   [name] has a current medication list which includes the following long-term medication(s): albuterol, amantadine, amlodipine, aripiprazole, azelastine, carvedilol, donepezil, esomeprazole, levothyroxine, losartan, montelukast, potassium chloride sa, torsemide, trazodone, venlafaxine hcl, and potassium chloride.  Orders:  Orders Placed This Encounter  Procedures  . Radiofrequency,Lumbar    Standing Status:   Future    Standing Expiration Date:   07/22/2021    Scheduling Instructions:     Side(s): Left-sided     Level: L3-4, L4-5,  Facets ( L3, L4, L5,  Medial Branch Nerves)     Sedation: Patient's choice.     Scheduling Timeframe: As soon as pre-approved    Order Specific Question:   Where will this procedure be performed?    Answer:   [facility]   Follow-up plan:   Return in about 3 weeks (around 08/12/2020) for Left L3,4,5 RFA with sedation.     Status post left L3, L4, L5 RFA 12/01/2017, 12/14/2018, 06/21/2019.  Status post right L3, L4, L5 RFA on 12/22/2017, 12/28/2018, 06/21/2019, 07/10/2019 helps decrease her pain and improve her functional status for axial low back for approximately 6 to 8 months post RFA.  Can repeat every 6 to 12 months.  Left L4 Sprint peripheral nerve stimulation 03/13/2020, lead displacement/migration, lead removed and  left L4 medial branch nerve PNS replacement on 05/15/2020, became dislodged again early February.      Recent Visits Date Type Provider Dept  05/15/20 Procedure visit Gillis Santa, MD [facility]  05/01/20 Office Visit Gillis Santa, MD [facility]  Showing recent visits within past 90 days and meeting all other requirements Today's Visits Date Type Provider Dept  07/22/20 Procedure visit Gillis Santa, MD [facility]  Showing today's visits and meeting all other requirements Future Appointments Date Type Provider Dept  08/01/20 Appointment Gillis Santa, MD [facility]  08/14/20 Appointment Gillis Santa, MD [facility]  Showing future appointments within next 90 days and meeting all other requirements  I discussed the assessment and treatment plan with the patient. The patient was provided an opportunity to ask questions and all were answered. The patient agreed with the plan and demonstrated an understanding of the instructions.  Patient advised to call back or seek an in-person evaluation if the symptoms or condition worsens.  Duration of encounter:20 minutes.  Note by: Gillis Santa, MD Date: 07/22/2020; Time: 11:28 AM

## 2020-07-22 NOTE — Progress Notes (Signed)
Safety precautions to be maintained throughout the outpatient stay will include: orient to surroundings, keep bed in low position, maintain call bell within reach at all times, provide assistance with transfer out of bed and ambulation.  

## 2020-08-01 ENCOUNTER — Ambulatory Visit
Payer: Medicare Other | Attending: Student in an Organized Health Care Education/Training Program | Admitting: Student in an Organized Health Care Education/Training Program

## 2020-08-01 ENCOUNTER — Encounter: Payer: Self-pay | Admitting: Student in an Organized Health Care Education/Training Program

## 2020-08-01 ENCOUNTER — Other Ambulatory Visit: Payer: Self-pay

## 2020-08-01 VITALS — BP 135/73 | HR 86 | Temp 97.9°F | Ht 66.0 in | Wt 308.0 lb

## 2020-08-01 DIAGNOSIS — M62830 Muscle spasm of back: Secondary | ICD-10-CM | POA: Insufficient documentation

## 2020-08-01 DIAGNOSIS — M47816 Spondylosis without myelopathy or radiculopathy, lumbar region: Secondary | ICD-10-CM | POA: Insufficient documentation

## 2020-08-01 DIAGNOSIS — G894 Chronic pain syndrome: Secondary | ICD-10-CM | POA: Insufficient documentation

## 2020-08-01 DIAGNOSIS — M5136 Other intervertebral disc degeneration, lumbar region: Secondary | ICD-10-CM | POA: Insufficient documentation

## 2020-08-01 NOTE — Progress Notes (Signed)
PROVIDER NOTE: Information contained herein reflects review and annotations entered in association with encounter. Interpretation of such information and data should be left to medically-trained personnel. Information provided to patient can be located elsewhere in the medical record under "Patient Instructions". Document created using STT-dictation technology, any transcriptional errors that may result from process are unintentional.    Patient: [name]  Service Category: E/M  Provider: Gillis Santa, MD  DOB: 22-Oct-1945  DOS: 08/01/2020  Specialty: Interventional Pain Management  MRN: [id number]  Setting: Ambulatory outpatient  PCP: Kirk Ruths, MD  Type: Established Patient    Referring Provider: Kirk Ruths, MD  Location: Office  Delivery: Face-to-face     HPI  [name], a 75 y.o. year old female, is here today because of her Lumbar facet arthropathy [M47.816]. [name]'s primary complain today is Back Pain Last encounter: My last encounter with her was on 07/22/2020. Pertinent problems: [name] has Lumbar spondylosis; Lumbar facet arthropathy; Lumbar degenerative disc disease; Spasm of lumbar paraspinous muscle; and Chronic pain syndrome on their pertinent problem list. Pain Assessment: Severity of Chronic pain is reported as a 5 /10. Location: Back Left/pain radiaties down to her left hip. Onset: More than a month ago. Quality: Aching,Constant. Timing: Constant. Modifying factor(s): meds.heat sitting resting. Vitals:  height is _0  (1.676 m) and weight is 308 lb (139.7 kg) (abnormal). Her temperature is 97.9 F (36.6 C). Her blood pressure is 135/73 and her pulse is 86. Her oxygen saturation is 99%.   Reason for encounter: medication management.    Patient presents for medication management however she still has 2 prescriptions of Nucynta that she can fill from her pharmacy, this was verified by  calling the pharmacy.  No change in dose.  No side effects with her medication.  They do help with her chronic low back pain and help her function better. She has an upcoming appointment at the beginning of March for a left sided radiofrequency ablation. We will repeat annual urine toxicology screen today for medication compliance monitoring.  Pharmacotherapy Assessment   Analgesic: Nucynta 50 mg daily, quantity 30/month.     Monitoring: [place] PMP: PDMP reviewed during this encounter.       Pharmacotherapy: No side-effects or adverse reactions reported. Compliance: No problems identified. Effectiveness: Clinically acceptable.  Chauncey Fischer, RN  08/01/2020 10:49 AM  Sign when Signing Visit Nursing Pain Medication Assessment:  Safety precautions to be maintained throughout the outpatient stay will include: orient to surroundings, keep bed in low position, maintain call bell within reach at all times, provide assistance with transfer out of bed and ambulation.  Medication Inspection Compliance: Pill count conducted under aseptic conditions, in front of the patient. Neither the pills nor the bottle was removed from the patient's sight at any time. Once count was completed pills were immediately returned to the patient in their original bottle.  Medication: Tapentadol (Nucynta) Pill/Patch Count: 3 of 30 pills remain Pill/Patch Appearance: Markings consistent with prescribed medication Bottle Appearance: Standard pharmacy container. Clearly labeled. Filled Date: 1 / 87 / 22 Last Medication intake:  TodaySafety precautions to be maintained throughout the outpatient stay will include: orient to surroundings, keep bed in low position, maintain call bell within reach at all times, provide assistance with transfer out of bed and ambulation.     UDS:  Summary  Date Value Ref Range Status  06/22/2019 Note  Final    Comment:     ====================================================================  ToxASSU[employer] Select 13 (MW) ==================================================================== Test                             Result       Flag       Units Drug Present and Declared for Prescription Verification   Tapentadol                     4009         EXPECTED   ng/mg creat    Source of tapentadol is a scheduled prescription medication. Drug Absent but Declared for Prescription Verification   Clonazepam                     Not Detected UNEXPECTED ng/mg creat ==================================================================== Test                      Result    Flag   Units      Ref Range   Creatinine              81               mg/dL      >=20 ==================================================================== Declared Medications:  The flagging and interpretation on this report are based on the  following declared medications.  Unexpected results may arise from  inaccuracies in the declared medications.  **Note: The testing scope of this panel includes these medications:  Clonazepam  Tapentadol  **Note: The testing scope of this panel does not include the  following reported medications:  Acetaminophen  Amantadine  Amlodipine  Aspirin  Azelastine  Baclofen  Botulinum (Botox)  Carvedilol  Celecoxib  Cholecalciferol  Donepezil  Esomeprazole  Folic Acid  Levothyroxine  Losartan  Melatonin  Montelukast  Multivitamin  Potassium (Klor-Con)  Prednisone  Torsemide  Trazodone  Turmeric  Venlafaxine  Vitamin B  Vitamin C ==================================================================== For clinical consultation, please call [phone]. ====================================================================      ROS  Constitutional: Denies any fever or chills Gastrointestinal: No reported hemesis, hematochezia, vomiting, or acute GI distress Musculoskeletal: Low back pain related to  lumbar facet arthropathy Neurological: No reported episodes of acute onset apraxia, aphasia, dysarthria, agnosia, amnesia, paralysis, loss of coordination, or loss of consciousness  Medication Review  ARIPiprazole, Biotin, OnabotulinumtoxinA, Venlafaxine HCl, Vitamin D3, acetaminophen, albuterol, amLODipine, amantadine, aspirin EC, azelastine, baclofen, carvedilol, celecoxib, donepezil, esomeprazole, fluticasone, fluticasone furoate-vilanterol, folic acid, guaiFENesin-dextromethorphan, levothyroxine, losartan, magnesium, melatonin, montelukast, multivitamin, nystatin, potassium chloride, potassium chloride SA, tapentadol, topiramate, torsemide, traZODone, vitamin C, and zinc gluconate  History Review  Allergy: [name] is allergic to codeine, povidone iodine, hydromorphone, latex, oxycodone-acetaminophen, sulfa antibiotics, bupropion, gabapentin, hydrocodone, morphine and related, and procaine. Drug: [name]  reports no history of drug use. Alcohol:  reports current alcohol use of about 2.0 standard drinks of alcohol per week. Tobacco:  reports that she has never smoked. She has never used smokeless tobacco. Social: [name]  reports that she has never smoked. She has never used smokeless tobacco. She reports current alcohol use of about 2.0 standard drinks of alcohol per week. She reports that she does not use drugs. 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 Surgical:  [name]  has a past surgical history that includes Ankle reconstruction (Left); Brachioplasty; Cataract extraction;  Cholecystectomy; Soft Tissue Tumor Resection; Abdominal hysterectomy (07/12/2014); Joint replacement (Bilateral, 07/2014, 03/2015); nasal reconstrucion; Toe Surgery (Right); Shoulder open rotator cuff repair; Colonoscopy with propofol (N/A, 12/07/2016); and Reduction mammaplasty (Bilateral). Family: family history includes Breast cancer in her paternal aunt; Lung cancer in her father and mother.  Laboratory Chemistry Profile   Renal Lab Results  Component Value Date   BUN 25 (H) 07/06/2020   CREATININE 0.99 07/06/2020   GFRNONAA 60 (L) 07/06/2020     Hepatic Lab Results  Component Value Date   AST 25 07/02/2020   ALT 21 07/02/2020   ALBUMIN 3.7 07/02/2020   ALKPHOS 80 07/02/2020     Electrolytes Lab Results  Component Value Date   NA 136 07/06/2020   K 4.1 07/06/2020   CL 98 07/06/2020   CALCIUM 9.0 07/06/2020   MG 2.0 07/06/2020   PHOS 3.0 07/06/2020     Bone No results found for: VD25OH, JS283TD1VOH, YW7371GG2, IR4854OE7, 25OHVITD1, 25OHVITD2, 25OHVITD3, TESTOFREE, TESTOSTERONE   Inflammation (CRP: Acute Phase) (ESR: Chronic Phase) Lab Results  Component Value Date   LATICACIDVEN 1.1 07/02/2020       Note: Above Lab results reviewed.  Recent Imaging Review  DG Chest 2 View CLINICAL DATA:  Cough, congestion for 2 days  EXAM: CHEST - 2 VIEW  COMPARISON:  None.  FINDINGS: The heart size and mediastinal contours are within normal limits. Diffusely coarsened interstitial markings. Streaky left basilar opacity. No pleural effusion or pneumothorax. The visualized skeletal structures are unremarkable.  IMPRESSION: 1. Streaky left basilar opacity, which may reflect atelectasis versus infiltrate. 2. Diffusely coarsened interstitial markings, which may reflect bronchitic type lung changes.  Electronically Signed   By: Davina Poke D.O.   On: 07/02/2020 12:39 Note: Reviewed        Physical Exam  General appearance: Well nourished, well developed, and well  hydrated. In no apparent acute distress Mental status: Alert, oriented x 3 (person, place, & time)       Respiratory: No evidence of acute respiratory distress Eyes: PERLA Vitals: BP 135/73   Pulse 86   Temp 97.9 F (36.6 C)   Ht _0  (1.676 m)   Wt (!) 308 lb (139.7 kg)   SpO2 99%   BMI 49.71 kg/m  BMI: Estimated body mass index is 49.71 kg/m as calculated from the following:   Height as of this encounter: _1  (1.676 m).   Weight as of this encounter: 308 lb (139.7 kg). Ideal: Ideal body weight: 59.3 kg (130 lb 11.7 oz) Adjusted ideal body weight: 91.5 kg (201 lb 10.2 oz)  Positive low back pain related to lumbar facet arthropathy  Assessment   Diagnosis  1. Lumbar facet arthropathy   2. Lumbar spondylosis   3. Lumbar degenerative disc disease   4. Spasm of lumbar paraspinous muscle   5. Chronic pain syndrome        Plan of Care   [name] has a current medication list which includes the following long-term medication(s): albuterol, amantadine, amlodipine, aripiprazole, azelastine, carvedilol, donepezil, esomeprazole, levothyroxine, losartan, montelukast, potassium chloride, potassium chloride sa, torsemide, trazodone, and venlafaxine hcl.   Continue Nucynta as prescribed.  Patient still has 2 prescriptions at her pharmacy that she can pick up.  This was verified by calling the pharmacy.  She has an upcoming appointment next month for a left-sided lumbar radiofrequency ablation.  Orders:  Orders Placed This Encounter  Procedures  . ToxASSURE Select 13 (MW), Urine    Volume: 30 ml(s). Minimum 3 ml of urine is needed. Document temperature of fresh sample. Indications: Long term (current) use of opiate analgesic (H73.428)    Order Specific Question:   Release to patient    Answer:   Immediate   Follow-up plan:   Return for Keep sch. appt.     Status post left L3, L4, L5 RFA 12/01/2017, 12/14/2018, 06/21/2019.  Status post right L3, L4, L5 RFA  on 12/22/2017, 12/28/2018, 06/21/2019, 07/10/2019 helps decrease her pain and improve her functional status for axial low back for approximately 6 to 8 months post RFA.  Can repeat every 6 to 12 months.  Left L4 Sprint peripheral nerve stimulation 03/13/2020, lead displacement/migration, lead removed and left L4 medial branch nerve PNS replacement on 05/15/2020, became dislodged again early February.       Recent Visits Date Type Provider Dept  07/22/20 Procedure visit Gillis Santa, MD [facility]  05/15/20 Procedure visit Gillis Santa, MD [facility]  Showing recent visits within past 90 days and meeting all other requirements Today's Visits Date Type Provider Dept  08/01/20 Office Visit Gillis Santa, MD [facility]  Showing today's visits and meeting all other requirements Future Appointments Date Type Provider Dept  08/14/20 Appointment Gillis Santa, MD [facility]  Showing future appointments within next 90 days and meeting all other requirements  I discussed the assessment and treatment plan with the patient. The patient was provided an opportunity to ask questions and all were answered. The patient agreed with the plan and demonstrated an understanding of the instructions.  Patient advised to call back or seek an in-person evaluation if the symptoms or condition worsens.  Duration of encounter:20 minutes.  Note by: Gillis Santa, MD Date: 08/01/2020; Time: 11:16 AM

## 2020-08-01 NOTE — Progress Notes (Signed)
Nursing Pain Medication Assessment:  Safety precautions to be maintained throughout the outpatient stay will include: orient to surroundings, keep bed in low position, maintain call bell within reach at all times, provide assistance with transfer out of bed and ambulation.  Medication Inspection Compliance: Pill count conducted under aseptic conditions, in front of the patient. Neither the pills nor the bottle was removed from the patient's sight at any time. Once count was completed pills were immediately returned to the patient in their original bottle.  Medication: Tapentadol (Nucynta) Pill/Patch Count: 3 of 30 pills remain Pill/Patch Appearance: Markings consistent with prescribed medication Bottle Appearance: Standard pharmacy container. Clearly labeled. Filled Date: 1 / 65 / 22 Last Medication intake:  TodaySafety precautions to be maintained throughout the outpatient stay will include: orient to surroundings, keep bed in low position, maintain call bell within reach at all times, provide assistance with transfer out of bed and ambulation.

## 2020-08-06 LAB — TOXASSURE SELECT 13 (MW), URINE

## 2020-08-14 ENCOUNTER — Ambulatory Visit (HOSPITAL_BASED_OUTPATIENT_CLINIC_OR_DEPARTMENT_OTHER): Payer: Medicare Other | Admitting: Student in an Organized Health Care Education/Training Program

## 2020-08-14 ENCOUNTER — Encounter: Payer: Self-pay | Admitting: Student in an Organized Health Care Education/Training Program

## 2020-08-14 ENCOUNTER — Ambulatory Visit
Admission: RE | Admit: 2020-08-14 | Discharge: 2020-08-14 | Disposition: A | Discharge status: Home / Self Care | Payer: Medicare Other | Source: Ambulatory Visit | Attending: Student in an Organized Health Care Education/Training Program | Admitting: Student in an Organized Health Care Education/Training Program

## 2020-08-14 ENCOUNTER — Other Ambulatory Visit: Payer: Self-pay

## 2020-08-14 VITALS — BP 180/92 | HR 79 | Temp 97.1°F | Resp 15 | Ht 66.0 in | Wt 297.0 lb

## 2020-08-14 DIAGNOSIS — G894 Chronic pain syndrome: Secondary | ICD-10-CM | POA: Diagnosis not present

## 2020-08-14 DIAGNOSIS — M47816 Spondylosis without myelopathy or radiculopathy, lumbar region: Secondary | ICD-10-CM | POA: Insufficient documentation

## 2020-08-14 MED ORDER — LIDOCAINE HCL 2 % IJ SOLN
20.0000 mL | Freq: Once | INTRAMUSCULAR | Status: AC
  Administered 2020-08-14: 200 mg

## 2020-08-14 MED ORDER — DEXAMETHASONE SODIUM PHOSPHATE 10 MG/ML IJ SOLN
10.0000 mg | Freq: Once | INTRAMUSCULAR | Status: AC
  Administered 2020-08-14: 10 mg

## 2020-08-14 MED ORDER — FENTANYL CITRATE (PF) 100 MCG/2ML IJ SOLN
25.0000 ug | INTRAMUSCULAR | Status: DC | PRN
  Administered 2020-08-14: 50 ug via INTRAVENOUS

## 2020-08-14 MED ORDER — ROPIVACAINE HCL 2 MG/ML IJ SOLN
9.0000 mL | Freq: Once | INTRAMUSCULAR | Status: AC
  Administered 2020-08-14: 10 mL via PERINEURAL

## 2020-08-14 NOTE — Progress Notes (Signed)
Safety precautions to be maintained throughout the outpatient stay will include: orient to surroundings, keep bed in low position, maintain call bell within reach at all times, provide assistance with transfer out of bed and ambulation.  

## 2020-08-14 NOTE — Progress Notes (Signed)
Patient's Name: [name]  MRN: [id number]  Referring Provider: Lauro RegulusAnderson, Marshall W, MD  DOB: 05/07/1946  PCP: Lauro RegulusAnderson, Marshall W, MD  DOS: 08/14/2020  Note by: Edward JollyBilal Tacie Mccuistion, MD  Service setting: Ambulatory outpatient  Specialty: Interventional Pain Management  Patient type: Established  Location: [facility] (AMB) Pain Management Facility  Visit type: Interventional Procedure   Primary Reason for Visit: Interventional Pain Management Treatment. CC: Back Pain  Procedure:       Anesthesia, Analgesia, Anxiolysis:  Type: Thermal Lumbar Facet, Medial Branch Radiofrequency Ablation/Neurotomy #5 Level:L3, L4, L5,  Medial Branch Level(s). These levels will denervate the L3-4, and the L4-5 lumbar facet joints. Primary Purpose: Therapeutic Region: Posterolateral Lumbosacral Spine Laterality: Left  (#1 done 12/01/2017 left L3, L4, L5 RFA, #2 done 12/14/2018, #3 done 06/21/19, #4 12/04/2019)   Type: Moderate (Conscious) Sedation combined with Local Anesthesia Indication(s): Analgesia and Anxiety Route: Intravenous (IV) IV Access: Secured Sedation: Meaningful verbal contact was maintained at all times during the procedure  Local Anesthetic: Lidocaine 1%   Indications: 1. Lumbar facet arthropathy   2. Lumbar spondylosis   3. Chronic pain syndrome    [name] has been dealing with the above chronic pain for longer than three months and has either failed to respond, was unable to tolerate, or simply did not get enough benefit from other more conservative therapies including, but not limited to: 1. Over-the-counter medications 2. Anti-inflammatory medications 3. Muscle relaxants 4. Membrane stabilizers 5. Opioids 6. Physical therapy 7. Modalities (Heat, ice, etc.) 8. Invasive techniques such as nerve blocks. [name] has attained more than 50% relief of the pain from a series of diagnostic injections conducted in separate occasions.  Pain  Score: Pre-procedure: 4 /10 Post-procedure: 0-No pain/10  Pre-op Assessment:  [name] is a 75 y.o. (year old), female patient, seen today for interventional treatment. She  has a past surgical history that includes Ankle reconstruction (Left); Brachioplasty; Cataract extraction; Cholecystectomy; Soft Tissue Tumor Resection; Abdominal hysterectomy (07/12/2014); Joint replacement (Bilateral, 07/2014, 03/2015); nasal reconstrucion; Toe Surgery (Right); Shoulder open rotator cuff repair; Colonoscopy with propofol (N/A, 12/07/2016); and Reduction mammaplasty (Bilateral). [name] has a current medication list which includes the following prescription(s): acetaminophen, albuterol, amantadine, amlodipine, aripiprazole, vitamin c, aspirin ec, azelastine, baclofen, biotin, carvedilol, celecoxib, vitamin d3, donepezil, esomeprazole, fluticasone, fluticasone furoate-vilanterol, folic acid, guaifenesin-dextromethorphan, levothyroxine, losartan, magnesium, melatonin, montelukast, multivitamin, nystatin, onabotulinumtoxina, potassium chloride, potassium chloride sa, rybelsus, tapentadol, topiramate, torsemide, trazodone, venlafaxine hcl, and zinc gluconate, and the following Facility-Administered Medications: fentanyl. Her primarily concern today is the Back Pain  Initial Vital Signs:  Pulse/HCG Rate: 79ECG Heart Rate: 85 Temp: 97.9 F (36.6 C) Resp: 18 BP: (!) 168/76 SpO2: 100 %  BMI: Estimated body mass index is 47.94 kg/m as calculated from the following:   Height as of this encounter: 5\' 6"  (1.676 m).   Weight as of this encounter: 297 lb (134.7 kg).  Risk Assessment: Allergies: Reviewed. She is allergic to codeine, povidone iodine, hydromorphone, latex, oxycodone-acetaminophen, sulfa antibiotics, bupropion, gabapentin, hydrocodone, morphine and related, and procaine.  Allergy Precautions: None required Coagulopathies: Reviewed. None identified.  Blood-thinner therapy:  None at this time Active Infection(s): Reviewed. None identified. [name] is afebrile  Site Confirmation: [name] was asked to confirm the procedure and laterality before marking the site Procedure checklist: Completed Consent: Before the procedure and under the influence of no sedative(s), amnesic(s), or anxiolytics, the patient was informed of the treatment options, risks and possible  complications. To fulfill our ethical and legal obligations, as recommended by the American Medical Association's Code of Ethics, I have informed the patient of my clinical impression; the nature and purpose of the treatment or procedure; the risks, benefits, and possible complications of the intervention; the alternatives, including doing nothing; the risk(s) and benefit(s) of the alternative treatment(s) or procedure(s); and the risk(s) and benefit(s) of doing nothing. The patient was provided information about the general risks and possible complications associated with the procedure. These may include, but are not limited to: failure to achieve desired goals, infection, bleeding, organ or nerve damage, allergic reactions, paralysis, and death. In addition, the patient was informed of those risks and complications associated to Spine-related procedures, such as failure to decrease pain; infection (i.e.: Meningitis, epidural or intraspinal abscess); bleeding (i.e.: epidural hematoma, subarachnoid hemorrhage, or any other type of intraspinal or peri-dural bleeding); organ or nerve damage (i.e.: Any type of peripheral nerve, nerve root, or spinal cord injury) with subsequent damage to sensory, motor, and/or autonomic systems, resulting in permanent pain, numbness, and/or weakness of one or several areas of the body; allergic reactions; (i.e.: anaphylactic reaction); and/or death. Furthermore, the patient was informed of those risks and complications associated with the medications. These  include, but are not limited to: allergic reactions (i.e.: anaphylactic or anaphylactoid reaction(s)); adrenal axis suppression; blood sugar elevation that in diabetics may result in ketoacidosis or comma; water retention that in patients with history of congestive heart failure may result in shortness of breath, pulmonary edema, and decompensation with resultant heart failure; weight gain; swelling or edema; medication-induced neural toxicity; particulate matter embolism and blood vessel occlusion with resultant organ, and/or nervous system infarction; and/or aseptic necrosis of one or more joints. Finally, the patient was informed that Medicine is not an exact science; therefore, there is also the possibility of unforeseen or unpredictable risks and/or possible complications that may result in a catastrophic outcome. The patient indicated having understood very clearly. We have given the patient no guarantees and we have made no promises. Enough time was given to the patient to ask questions, all of which were answered to the patient's satisfaction. [name] has indicated that she wanted to continue with the procedure. Attestation: I, the ordering provider, attest that I have discussed with the patient the benefits, risks, side-effects, alternatives, likelihood of achieving goals, and potential problems during recovery for the procedure that I have provided informed consent. Date  Time: 08/14/2020  8:04 AM  Pre-Procedure Preparation:  Monitoring: As per clinic protocol. Respiration, ETCO2, SpO2, BP, heart rate and rhythm monitor placed and checked for adequate function Safety Precautions: Patient was assessed for positional comfort and pressure points before starting the procedure. Time-out: I initiated and conducted the "Time-out" before starting the procedure, as per protocol. The patient was asked to participate by confirming the accuracy of the "Time Out" information. Verification of  the correct person, site, and procedure were performed and confirmed by me, the nursing staff, and the patient. "Time-out" conducted as per Joint Commission's Universal Protocol (UP.01.01.01). Time: 0839  Description of Procedure:       Position: Prone Laterality: Left Levels:   L3, L4, L5,  Medial Branch Level(s), at the L3-4 and the L4-5 lumbar facet joints. Area Prepped: Lumbosacral Prepping solution: ChloraPrep (2% chlorhexidine gluconate and 70% isopropyl alcohol) Safety Precautions: Aspiration looking for blood return was conducted prior to all injections. At no point did we inject any substances, as a needle was being advanced. Before injecting,  the patient was told to immediately notify me if she was experiencing any new onset of "ringing in the ears, or metallic taste in the mouth". No attempts were made at seeking any paresthesias. Safe injection practices and needle disposal techniques used. Medications properly checked for expiration dates. SDV (single dose vial) medications used. After the completion of the procedure, all disposable equipment used was discarded in the proper designated medical waste containers. Local Anesthesia: Protocol guidelines were followed. The patient was positioned over the fluoroscopy table. The area was prepped in the usual manner. The time-out was completed. The target area was identified using fluoroscopy. A 12-in long, straight, sterile hemostat was used with fluoroscopic guidance to locate the targets for each level blocked. Once located, the skin was marked with an approved surgical skin marker. Once all sites were marked, the skin (epidermis, dermis, and hypodermis), as well as deeper tissues (fat, connective tissue and muscle) were infiltrated with a small amount of a short-acting local anesthetic, loaded on a 10cc syringe with a 25G, 1.5-in  Needle. An appropriate amount of time was allowed for local anesthetics to take effect before proceeding to the next  step. Local Anesthetic: Lidocaine 1.0% The unused portion of the local anesthetic was discarded in the proper designated containers. Technical explanation of process:  Radiofrequency Ablation (RFA)  L3 Medial Branch Nerve RFA: The target area for the L3 medial branch is at the junction of the postero-lateral aspect of the superior articular process and the superior, posterior, and medial edge of the transverse process of L4. Under fluoroscopic guidance, a Radiofrequency needle was inserted until contact was made with os over the superior postero-lateral aspect of the pedicular shadow (target area). Sensory and motor testing was conducted to properly adjust the position of the needle. Once satisfactory placement of the needle was achieved, the numbing solution was slowly injected after negative aspiration for blood. 1 mL of the nerve block solution was injected without difficulty or complication. After waiting for at least 3 minutes, the ablation was performed. Once completed, the needle was removed intact. L4 Medial Branch Nerve RFA: The target area for the L4 medial branch is at the junction of the postero-lateral aspect of the superior articular process and the superior, posterior, and medial edge of the transverse process of L5. Under fluoroscopic guidance, a Radiofrequency needle was inserted until contact was made with os over the superior postero-lateral aspect of the pedicular shadow (target area). Sensory and motor testing was conducted to properly adjust the position of the needle. Once satisfactory placement of the needle was achieved, the numbing solution was slowly injected after negative aspiration for blood. 1mL of the nerve block solution was injected without difficulty or complication. After waiting for at least 3 minutes, the ablation was performed. Once completed, the needle was removed intact. L5 Medial Branch Nerve RFA: The target area for the L5 medial branch is at the junction of the  postero-lateral aspect of the superior articular process of S1 and the superior, posterior, and medial edge of the sacral ala. Under fluoroscopic guidance, a Radiofrequency needle was inserted until contact was made with os over the superior postero-lateral aspect of the pedicular shadow (target area). Sensory and motor testing was conducted to properly adjust the position of the needle. Once satisfactory placement of the needle was achieved, the numbing solution was slowly injected after negative aspiration for blood. 1mL of the nerve block solution was injected without difficulty or complication. After waiting for at least 3 minutes, the  ablation was performed. Once completed, the needle was removed intact.  Radiofrequency lesioning (ablation):  Radiofrequency Generator: NeuroTherm [id number] Sensory Stimulation Parameters: 50 Hz was used to locate & identify the nerve, making sure that the needle was positioned such that there was no sensory stimulation below 0.3 V or above 0.7 V. Motor Stimulation Parameters: 2 Hz was used to evaluate the motor component. Care was taken not to lesion any nerves that demonstrated motor stimulation of the lower extremities at an output of less than 2.5 times that of the sensory threshold, or a maximum of 2.0 V. Lesioning Technique Parameters: Standard Radiofrequency settings. (Not bipolar or pulsed.) Temperature Settings: 80 degrees C Lesioning time: 60 seconds Intra-operative Compliance: Compliant Materials & Medications: Needle(s) (Electrode/Cannula) Type: Teflon-coated, curved tip, Radiofrequency needle(s) Gauge: 22G Length: 10cm Numbing solution: 5 cc solution of 4 cc of 0.2% Ropivacaine and 1 cc of Decadron 10mg /cc, 1-1.5 cc injected at each level above prior to ablation. The unused portion of the solution was discarded in the proper designated containers.  Once the entire procedure was completed, the treated area was cleaned, making sure to leave some of the  prepping solution back to take advantage of its long term bactericidal properties.  Illustration of the posterior view of the lumbar spine and the posterior neural structures. Laminae of L2 through S1 are labeled. DPRL5, dorsal primary ramus of L5; DPRS1, dorsal primary ramus of S1; DPR3, dorsal primary ramus of L3; FJ, facet (zygapophyseal) joint L3-L4; I, inferior articular process of L4; LB1, lateral branch of dorsal primary ramus of L1; IAB, inferior articular branches from L3 medial branch (supplies L4-L5 facet joint); IBP, intermediate branch plexus; MB3, medial branch of dorsal primary ramus of L3; NR3, third lumbar nerve root; S, superior articular process of L5; SAB, superior articular branches from L4 (supplies L4-5 facet joint also); TP3, transverse process of L3.  Vitals:   08/14/20 0906 08/14/20 0916 08/14/20 0926 08/14/20 0935  BP: (!) 177/108 (!) 178/97 (!) 177/98 (!) 180/92  Pulse:      Resp: 11 13 14 15   Temp:  (!) 97.2 F (36.2 C)  (!) 97.1 F (36.2 C)  SpO2: 97% 93% 98% 98%  Weight:      Height:        Start Time: 0839 hrs. End Time: 0905 hrs.  Imaging Guidance (Spinal):  Type of Imaging Technique: Fluoroscopy Guidance (Spinal) Indication(s): Assistance in needle guidance and placement for procedures requiring needle placement in or near specific anatomical locations not easily accessible without such assistance. Exposure Time: Please see nurses notes. Contrast: None used. Fluoroscopic Guidance: I was personally present during the use of fluoroscopy. "Tunnel Vision Technique" used to obtain the best possible view of the target area. Parallax error corrected before commencing the procedure. "Direction-depth-direction" technique used to introduce the needle under continuous pulsed fluoroscopy. Once target was reached, antero-posterior, oblique, and lateral fluoroscopic projection used confirm needle placement in all planes. Images permanently stored in EMR. Interpretation:  No contrast injected. I personally interpreted the imaging intraoperatively. Adequate needle placement confirmed in multiple planes. Permanent images saved into the patient's record.  Antibiotic Prophylaxis:   Anti-infectives (From admission, onward)   None     Indication(s): None identified  Post-operative Assessment:  Post-procedure Vital Signs:  Pulse/HCG Rate: 7978 Temp: (!) 97.1 F (36.2 C) Resp: 15 BP: (!) 180/92 SpO2: 98 %  EBL: None  Complications: No immediate post-treatment complications observed by team, or reported by patient.  Note: The patient tolerated the entire  procedure well. A repeat set of vitals were taken after the procedure and the patient was kept under observation following institutional policy, for this type of procedure. Post-procedural neurological assessment was performed, showing return to baseline, prior to discharge. The patient was provided with post-procedure discharge instructions, including a section on how to identify potential problems. Should any problems arise concerning this procedure, the patient was given instructions to immediately contact us, at any time, without hesitation. In any case, we plan to contact the patient by telephone for a follow-up status report regarding this interventional procedure.  Comments:  No additional relevant information. 5 out of 5 strength bilateral lower extremity: Plantar flexion, dorsiflexion, knee flexion, knee extension.  Plan of Care    Imaging Orders     DG PAIN CLINIC C-ARM 1-60 MIN NO REPORT Procedure Orders    No procedure(s) ordered today     Medications ordered for procedure: Meds ordered this encounter  Medications  . lidocaine (XYLOCAINE) 2 % (with pres) injection 400 mg  . fentaNYL (SUBLIMAZE) injection 25-50 mcg    Make sure Narcan is available in the pyxis when using this medication. In the event of respiratory depression (RR< 8/min): Titrate NARCAN (naloxone) in increments of 0.1 to  0.2 mg IV at 2-3 minute intervals, until desired degree of reversal.  . ropivacaine (PF) 2 mg/mL (0.2%) (NAROPIN) injection 9 mL  . dexamethasone (DECADRON) injection 10 mg   Medications administered: We administered lidocaine, fentaNYL, ropivacaine (PF) 2 mg/mL (0.2%), and dexamethasone.  See the medical record for exact dosing, route, and time of administration.  New Prescriptions   No medications on file   Disposition: Discharge home  Discharge Date & Time: 08/14/2020; 0936 hrs.   Physician-requested Follow-up: Return in about 12 weeks (around 11/06/2020) for Medication Management, Post Procedure Evaluation, in person.  Future Appointments  Date Time Provider Department Center  10/22/2020  2:00 PM Edward Jolly, MD [facility] None   Primary Care Physician: Lauro Regulus, MD Location: [facility] Note by: Edward Jolly, MD Date: 08/14/2020; Time: 1:07 PM  Disclaimer:  Medicine is not an exact science. The only guarantee in medicine is that nothing is guaranteed. It is important to note that the decision to proceed with this intervention was based on the information collected from the patient. The Data and conclusions were drawn from the patient's questionnaire, the interview, and the physical examination. Because the information was provided in large part by the patient, it cannot be guaranteed that it has not been purposely or unconsciously manipulated. Every effort has been made to obtain as much relevant data as possible for this evaluation. It is important to note that the conclusions that lead to this procedure are derived in large part from the available data. Always take into account that the treatment will also be dependent on availability of resources and existing treatment guidelines, considered by other Pain Management Practitioners as being common knowledge and practice, at the time of the intervention. For Medico-Legal purposes, it is also  important to point out that variation in procedural techniques and pharmacological choices are the acceptable norm. The indications, contraindications, technique, and results of the above procedure should only be interpreted and judged by a Board-Certified Interventional Pain Specialist with extensive familiarity and expertise in the same exact procedure and technique.

## 2020-08-15 ENCOUNTER — Telehealth: Payer: Self-pay | Admitting: *Deleted

## 2020-08-15 NOTE — Telephone Encounter (Signed)
Denies any post procedure issues. 

## 2020-09-27 ENCOUNTER — Telehealth: Payer: Self-pay | Admitting: Student in an Organized Health Care Education/Training Program

## 2020-09-27 DIAGNOSIS — G894 Chronic pain syndrome: Secondary | ICD-10-CM

## 2020-09-27 DIAGNOSIS — M47816 Spondylosis without myelopathy or radiculopathy, lumbar region: Secondary | ICD-10-CM

## 2020-09-27 NOTE — Telephone Encounter (Addendum)
Patient wants to come in for R RFA, states she is having a lot of pain. Please place order so we can get this scheduled.  The order in chart is for L RFA which she had done in March.

## 2020-09-30 ENCOUNTER — Telehealth: Payer: Self-pay | Admitting: Student in an Organized Health Care Education/Training Program

## 2020-09-30 ENCOUNTER — Ambulatory Visit
Admission: RE | Admit: 2020-09-30 | Discharge: 2020-09-30 | Disposition: A | Discharge status: Home / Self Care | Payer: Medicare Other | Source: Ambulatory Visit | Attending: Student in an Organized Health Care Education/Training Program | Admitting: Student in an Organized Health Care Education/Training Program

## 2020-09-30 ENCOUNTER — Encounter: Payer: Self-pay | Admitting: Student in an Organized Health Care Education/Training Program

## 2020-09-30 ENCOUNTER — Ambulatory Visit (HOSPITAL_BASED_OUTPATIENT_CLINIC_OR_DEPARTMENT_OTHER): Payer: Medicare Other | Admitting: Student in an Organized Health Care Education/Training Program

## 2020-09-30 ENCOUNTER — Other Ambulatory Visit: Payer: Self-pay

## 2020-09-30 VITALS — BP 143/69 | HR 80 | Temp 97.4°F | Resp 15 | Ht 66.0 in | Wt 297.0 lb

## 2020-09-30 DIAGNOSIS — G894 Chronic pain syndrome: Secondary | ICD-10-CM | POA: Diagnosis not present

## 2020-09-30 DIAGNOSIS — M47816 Spondylosis without myelopathy or radiculopathy, lumbar region: Secondary | ICD-10-CM | POA: Insufficient documentation

## 2020-09-30 DIAGNOSIS — M47896 Other spondylosis, lumbar region: Secondary | ICD-10-CM | POA: Diagnosis not present

## 2020-09-30 DIAGNOSIS — M5386 Other specified dorsopathies, lumbar region: Secondary | ICD-10-CM | POA: Diagnosis not present

## 2020-09-30 DIAGNOSIS — Z885 Allergy status to narcotic agent status: Secondary | ICD-10-CM | POA: Insufficient documentation

## 2020-09-30 DIAGNOSIS — Z882 Allergy status to sulfonamides status: Secondary | ICD-10-CM | POA: Diagnosis not present

## 2020-09-30 DIAGNOSIS — M5136 Other intervertebral disc degeneration, lumbar region: Secondary | ICD-10-CM

## 2020-09-30 DIAGNOSIS — Z888 Allergy status to other drugs, medicaments and biological substances status: Secondary | ICD-10-CM | POA: Insufficient documentation

## 2020-09-30 DIAGNOSIS — M62830 Muscle spasm of back: Secondary | ICD-10-CM | POA: Insufficient documentation

## 2020-09-30 MED ORDER — DEXAMETHASONE SODIUM PHOSPHATE 10 MG/ML IJ SOLN
10.0000 mg | Freq: Once | INTRAMUSCULAR | Status: AC
  Administered 2020-09-30: 10 mg
  Filled 2020-09-30: qty 1

## 2020-09-30 MED ORDER — TAPENTADOL HCL 50 MG PO TABS: 50.0000 mg | ORAL_TABLET | Freq: Every day | ORAL | 0 refills | Status: DC | PRN

## 2020-09-30 MED ORDER — LIDOCAINE HCL 2 % IJ SOLN
20.0000 mL | Freq: Once | INTRAMUSCULAR | Status: AC
  Administered 2020-09-30: 400 mg
  Filled 2020-09-30: qty 20

## 2020-09-30 MED ORDER — TAPENTADOL HCL 50 MG PO TABS: 50.0000 mg | ORAL_TABLET | Freq: Every day | ORAL | 0 refills | Status: AC | PRN

## 2020-09-30 MED ORDER — FENTANYL CITRATE (PF) 100 MCG/2ML IJ SOLN
25.0000 ug | INTRAMUSCULAR | Status: AC | PRN
  Administered 2020-09-30: 25 ug via INTRAVENOUS
  Administered 2020-09-30: 50 ug via INTRAVENOUS
  Filled 2020-09-30: qty 2

## 2020-09-30 MED ORDER — ROPIVACAINE HCL 2 MG/ML IJ SOLN
9.0000 mL | Freq: Once | INTRAMUSCULAR | Status: AC
  Administered 2020-09-30: 9 mL via PERINEURAL
  Filled 2020-09-30: qty 10

## 2020-09-30 NOTE — Telephone Encounter (Signed)
Lvmail asking patient to call asap for appt. / doesn't need prior auth

## 2020-09-30 NOTE — Progress Notes (Signed)
Patient's Name: [name]  MRN: [id number]  Referring Provider: Lauro Regulus, MD  DOB: [birth year]  PCP: Lauro Regulus, MD  DOS: 09/30/2020  Note by: Edward Jolly, MD  Service setting: Ambulatory outpatient  Specialty: Interventional Pain Management  Patient type: Established  Location: [facility] (AMB) Pain Management Facility  Visit type: Interventional Procedure   Primary Reason for Visit: Interventional Pain Management Treatment. CC: Back Pain  Procedure:       Anesthesia, Analgesia, Anxiolysis:  Type: Thermal Lumbar Facet, Medial Branch Radiofrequency Ablation/Neurotomy #6 Level:L3, L4, L5,  Medial Branch Level(s). These levels will denervate the L3-4, and the L4-5 lumbar facet joints. Primary Purpose: Therapeutic Region: Posterolateral Lumbosacral Spine Laterality: Right  (#1 done 12/01/2017 left L3, L4, L5 RFA, #2 done 12/14/2018, #3 done 06/21/19, #4 12/25/19, #5 03/25/20)   Type: Moderate (Conscious) Sedation combined with Local Anesthesia Indication(s): Analgesia and Anxiety Route: Intravenous (IV) IV Access: Secured Sedation: Meaningful verbal contact was maintained at all times during the procedure  Local Anesthetic: Lidocaine 1%   Indications: 1. Lumbar facet arthropathy   2. Lumbar spondylosis   3. Lumbar degenerative disc disease   4. Spasm of lumbar paraspinous muscle   5. Chronic pain syndrome    [name] has been dealing with the above chronic pain for longer than three months and has either failed to respond, was unable to tolerate, or simply did not get enough benefit from other more conservative therapies including, but not limited to: 1. Over-the-counter medications 2. Anti-inflammatory medications 3. Muscle relaxants 4. Membrane stabilizers 5. Opioids 6. Physical therapy 7. Modalities (Heat, ice, etc.) 8. Invasive techniques such as nerve blocks. [name] has attained more than 50% relief of the pain  from a series of diagnostic injections conducted in separate occasions.  Pain Score: Pre-procedure: 8  (right side)/10 Post-procedure: 0-No pain/10    Pre-op Assessment:  [name] is a 75 y.o. (year old), female patient, seen today for interventional treatment. She  has a past surgical history that includes Ankle reconstruction (Left); Brachioplasty; Cataract extraction; Cholecystectomy; Soft Tissue Tumor Resection; Abdominal hysterectomy (07/12/2014); Joint replacement (Bilateral, 07/2014, 03/2015); nasal reconstrucion; Toe Surgery (Right); Shoulder open rotator cuff repair; Colonoscopy with propofol (N/A, 12/07/2016); and Reduction mammaplasty (Bilateral). [name] has a current medication list which includes the following prescription(s): acetaminophen, albuterol, amantadine, amlodipine, aripiprazole, vitamin c, aspirin ec, azelastine, baclofen, biotin, carvedilol, celecoxib, vitamin d3, donepezil, esomeprazole, fluticasone, fluticasone furoate-vilanterol, folic acid, guaifenesin-dextromethorphan, levothyroxine, losartan, magnesium, melatonin, montelukast, multivitamin, nystatin, onabotulinumtoxina, potassium chloride, potassium chloride sa, rybelsus, tapentadol, [START ON 10/30/2020] tapentadol, [START ON 11/29/2020] tapentadol, topiramate, torsemide, trazodone, venlafaxine hcl, and zinc gluconate. Her primarily concern today is the Back Pain  Initial Vital Signs:  Pulse/HCG Rate: 87ECG Heart Rate: 81 Temp: 97.8 F (36.6 C) Resp: 16 BP: (!) 153/80 SpO2: 97 %  BMI: Estimated body mass index is 47.94 kg/m as calculated from the following:   Height as of this encounter: 5\' 6"  (1.676 m).   Weight as of this encounter: 297 lb (134.7 kg).  Risk Assessment: Allergies: Reviewed. She is allergic to codeine, povidone iodine, hydromorphone, latex, oxycodone-acetaminophen, sulfa antibiotics, bupropion, gabapentin, hydrocodone, morphine and related, and procaine.   Allergy Precautions: None required Coagulopathies: Reviewed. None identified.  Blood-thinner therapy: None at this time Active Infection(s): Reviewed. None identified. Ms. [name] is afebrile  Site Confirmation: [name] was asked to confirm the procedure and laterality before marking the site Procedure checklist: Completed Consent: Before  the procedure and under the influence of no sedative(s), amnesic(s), or anxiolytics, the patient was informed of the treatment options, risks and possible complications. To fulfill our ethical and legal obligations, as recommended by the American Medical Association's Code of Ethics, I have informed the patient of my clinical impression; the nature and purpose of the treatment or procedure; the risks, benefits, and possible complications of the intervention; the alternatives, including doing nothing; the risk(s) and benefit(s) of the alternative treatment(s) or procedure(s); and the risk(s) and benefit(s) of doing nothing. The patient was provided information about the general risks and possible complications associated with the procedure. These may include, but are not limited to: failure to achieve desired goals, infection, bleeding, organ or nerve damage, allergic reactions, paralysis, and death. In addition, the patient was informed of those risks and complications associated to Spine-related procedures, such as failure to decrease pain; infection (i.e.: Meningitis, epidural or intraspinal abscess); bleeding (i.e.: epidural hematoma, subarachnoid hemorrhage, or any other type of intraspinal or peri-dural bleeding); organ or nerve damage (i.e.: Any type of peripheral nerve, nerve root, or spinal cord injury) with subsequent damage to sensory, motor, and/or autonomic systems, resulting in permanent pain, numbness, and/or weakness of one or several areas of the body; allergic reactions; (i.e.: anaphylactic reaction); and/or  death. Furthermore, the patient was informed of those risks and complications associated with the medications. These include, but are not limited to: allergic reactions (i.e.: anaphylactic or anaphylactoid reaction(s)); adrenal axis suppression; blood sugar elevation that in diabetics may result in ketoacidosis or comma; water retention that in patients with history of congestive heart failure may result in shortness of breath, pulmonary edema, and decompensation with resultant heart failure; weight gain; swelling or edema; medication-induced neural toxicity; particulate matter embolism and blood vessel occlusion with resultant organ, and/or nervous system infarction; and/or aseptic necrosis of one or more joints. Finally, the patient was informed that Medicine is not an exact science; therefore, there is also the possibility of unforeseen or unpredictable risks and/or possible complications that may result in a catastrophic outcome. The patient indicated having understood very clearly. We have given the patient no guarantees and we have made no promises. Enough time was given to the patient to ask questions, all of which were answered to the patient's satisfaction. [name] has indicated that she wanted to continue with the procedure. Attestation: I, the ordering provider, attest that I have discussed with the patient the benefits, risks, side-effects, alternatives, likelihood of achieving goals, and potential problems during recovery for the procedure that I have provided informed consent. Date  Time: 09/30/2020  1:05 PM  Pre-Procedure Preparation:  Monitoring: As per clinic protocol. Respiration, ETCO2, SpO2, BP, heart rate and rhythm monitor placed and checked for adequate function Safety Precautions: Patient was assessed for positional comfort and pressure points before starting the procedure. Time-out: I initiated and conducted the "Time-out" before starting the procedure, as per  protocol. The patient was asked to participate by confirming the accuracy of the "Time Out" information. Verification of the correct person, site, and procedure were performed and confirmed by me, the nursing staff, and the patient. "Time-out" conducted as per Joint Commission's Universal Protocol (UP.01.01.01). Time: 1335  Description of Procedure:       Position: Prone Laterality: Right Levels:   L3, L4, L5,  Medial Branch Level(s), at the L3-4 and the L4-5 lumbar facet joints. Area Prepped: Lumbosacral Prepping solution: ChloraPrep (2% chlorhexidine gluconate and 70% isopropyl alcohol) Safety Precautions: Aspiration looking for blood  return was conducted prior to all injections. At no point did we inject any substances, as a needle was being advanced. Before injecting, the patient was told to immediately notify me if she was experiencing any new onset of "ringing in the ears, or metallic taste in the mouth". No attempts were made at seeking any paresthesias. Safe injection practices and needle disposal techniques used. Medications properly checked for expiration dates. SDV (single dose vial) medications used. After the completion of the procedure, all disposable equipment used was discarded in the proper designated medical waste containers. Local Anesthesia: Protocol guidelines were followed. The patient was positioned over the fluoroscopy table. The area was prepped in the usual manner. The time-out was completed. The target area was identified using fluoroscopy. A 12-in long, straight, sterile hemostat was used with fluoroscopic guidance to locate the targets for each level blocked. Once located, the skin was marked with an approved surgical skin marker. Once all sites were marked, the skin (epidermis, dermis, and hypodermis), as well as deeper tissues (fat, connective tissue and muscle) were infiltrated with a small amount of a short-acting local anesthetic, loaded on a 10cc syringe with a 25G,  1.5-in  Needle. An appropriate amount of time was allowed for local anesthetics to take effect before proceeding to the next step. Local Anesthetic: Lidocaine 1.0% The unused portion of the local anesthetic was discarded in the proper designated containers. Technical explanation of process:  Radiofrequency Ablation (RFA)  L3 Medial Branch Nerve RFA: The target area for the L3 medial branch is at the junction of the postero-lateral aspect of the superior articular process and the superior, posterior, and medial edge of the transverse process of L4. Under fluoroscopic guidance, a Radiofrequency needle was inserted until contact was made with os over the superior postero-lateral aspect of the pedicular shadow (target area). Sensory and motor testing was conducted to properly adjust the position of the needle. Once satisfactory placement of the needle was achieved, the numbing solution was slowly injected after negative aspiration for blood. 1 mL of the nerve block solution was injected without difficulty or complication. After waiting for at least 3 minutes, the ablation was performed. Once completed, the needle was removed intact. L4 Medial Branch Nerve RFA: The target area for the L4 medial branch is at the junction of the postero-lateral aspect of the superior articular process and the superior, posterior, and medial edge of the transverse process of L5. Under fluoroscopic guidance, a Radiofrequency needle was inserted until contact was made with os over the superior postero-lateral aspect of the pedicular shadow (target area). Sensory and motor testing was conducted to properly adjust the position of the needle. Once satisfactory placement of the needle was achieved, the numbing solution was slowly injected after negative aspiration for blood. 1mL of the nerve block solution was injected without difficulty or complication. After waiting for at least 3 minutes, the ablation was performed. Once completed, the  needle was removed intact. L5 Medial Branch Nerve RFA: The target area for the L5 medial branch is at the junction of the postero-lateral aspect of the superior articular process of S1 and the superior, posterior, and medial edge of the sacral ala. Under fluoroscopic guidance, a Radiofrequency needle was inserted until contact was made with os over the superior postero-lateral aspect of the pedicular shadow (target area). Sensory and motor testing was conducted to properly adjust the position of the needle. Once satisfactory placement of the needle was achieved, the numbing solution was slowly injected after negative  aspiration for blood. 1mL of the nerve block solution was injected without difficulty or complication. After waiting for at least 3 minutes, the ablation was performed. Once completed, the needle was removed intact.  Radiofrequency lesioning (ablation):  Radiofrequency Generator: NeuroTherm [id number] Sensory Stimulation Parameters: 50 Hz was used to locate & identify the nerve, making sure that the needle was positioned such that there was no sensory stimulation below 0.3 V or above 0.7 V. Motor Stimulation Parameters: 2 Hz was used to evaluate the motor component. Care was taken not to lesion any nerves that demonstrated motor stimulation of the lower extremities at an output of less than 2.5 times that of the sensory threshold, or a maximum of 2.0 V. Lesioning Technique Parameters: Standard Radiofrequency settings. (Not bipolar or pulsed.) Temperature Settings: 80 degrees C Lesioning time: 60 seconds Intra-operative Compliance: Compliant Materials & Medications: Needle(s) (Electrode/Cannula) Type: Teflon-coated, curved tip, Radiofrequency needle(s) Gauge: 22G Length: 10cm Numbing solution: 5 cc solution of 4 cc of 0.2% Ropivacaine and 1 cc of Decadron /cc, 1-1.5 cc injected at each level above prior to ablation. The unused portion of the solution was discarded in the proper designated  containers.  Once the entire procedure was completed, the treated area was cleaned, making sure to leave some of the prepping solution back to take advantage of its long term bactericidal properties.  Illustration of the posterior view of the lumbar spine and the posterior neural structures. Laminae of L2 through S1 are labeled. DPRL5, dorsal primary ramus of L5; DPRS1, dorsal primary ramus of S1; DPR3, dorsal primary ramus of L3; FJ, facet (zygapophyseal) joint L3-L4; I, inferior articular process of L4; LB1, lateral branch of dorsal primary ramus of L1; IAB, inferior articular branches from L3 medial branch (supplies L4-L5 facet joint); IBP, intermediate branch plexus; MB3, medial branch of dorsal primary ramus of L3; NR3, third lumbar nerve root; S, superior articular process of L5; SAB, superior articular branches from L4 (supplies L4-5 facet joint also); TP3, transverse process of L3.  Vitals:   09/30/20 1350 09/30/20 1355 09/30/20 1400 09/30/20 1410  BP: 137/87 (!) 141/86 (!) 157/70 (!) 152/71  Pulse: 81 80    Resp: Temp:   97.6 F (36.4 C)   SpO2: 93% 95% 99% 96%  Weight:      Height:        Start Time: 1335 hrs. End Time: 1353 hrs.  Imaging Guidance (Spinal):  Type of Imaging Technique: Fluoroscopy Guidance (Spinal) Indication(s): Assistance in needle guidance and placement for procedures requiring needle placement in or near specific anatomical locations not easily accessible without such assistance. Exposure Time: Please see nurses notes. Contrast: None used. Fluoroscopic Guidance: I was personally present during the use of fluoroscopy. "Tunnel Vision Technique" used to obtain the best possible view of the target area. Parallax error corrected before commencing the procedure. "Direction-depth-direction" technique used to introduce the needle under continuous pulsed fluoroscopy. Once target was reached, antero-posterior, oblique, and lateral fluoroscopic projection used  confirm needle placement in all planes. Images permanently stored in EMR. Interpretation: No contrast injected. I personally interpreted the imaging intraoperatively. Adequate needle placement confirmed in multiple planes. Permanent images saved into the patient's record.  Antibiotic Prophylaxis:   Anti-infectives (From admission, onward)   None     Indication(s): None identified  Post-operative Assessment:  Post-procedure Vital Signs:  Pulse/HCG Rate: 80 (nsr)87 Temp: 97.6 F (36.4 C) Resp: 14 BP: (!) 152/71 SpO2: 96 %  EBL: None  Complications: No  immediate post-treatment complications observed by team, or reported by patient.  Note: The patient tolerated the entire procedure well. A repeat set of vitals were taken after the procedure and the patient was kept under observation following institutional policy, for this type of procedure. Post-procedural neurological assessment was performed, showing return to baseline, prior to discharge. The patient was provided with post-procedure discharge instructions, including a section on how to identify potential problems. Should any problems arise concerning this procedure, the patient was given instructions to immediately contact us, at any time, without hesitation. In any case, we plan to contact the patient by telephone for a follow-up status report regarding this interventional procedure.  Comments:  No additional relevant information. 5 out of 5 strength bilateral lower extremity: Plantar flexion, dorsiflexion, knee flexion, knee extension.    Pharmacotherapy Assessment   Analgesic: Nucynta 50 mg daily, quantity 30/month.     Monitoring: [place] PMP: PDMP reviewed during this encounter.       Pharmacotherapy: No side-effects or adverse reactions reported. Compliance: No problems identified. Effectiveness: Clinically acceptable.  UDS:  Summary  Date Value Ref Range Status  08/01/2020 Note  Final    Comment:     ==================================================================== [employer] Select 13 (MW) ==================================================================== Test                             Result       Flag       Units  Drug Present and Declared for Prescription Verification   Tapentadol                     5398         EXPECTED   ng/mg creat    Source of tapentadol is a scheduled prescription medication.  ==================================================================== Test                      Result    Flag   Units      Ref Range   Creatinine              158              mg/dL      >=09>=20 ==================================================================== Declared Medications:  The flagging and interpretation on this report are based on the  following declared medications.  Unexpected results may arise from  inaccuracies in the declared medications.   **Note: The testing scope of this panel includes these medications:   Tapentadol (Nucynta)   **Note: The testing scope of this panel does not include the  following reported medications:   Acetaminophen (Tylenol)  Albuterol  Amantadine  Amlodipine (Norvasc)  Aripiprazole (Abilify)  Aspirin  Azelastine (Astelin)  Baclofen (Lioresal)  Biotin  Carvedilol (Coreg)  Celecoxib (Celebrex)  Donepezil (Aricept)  Esomeprazole (Nexium)  Fluticasone (Breo)  Fluticasone (Flonase)  Folic Acid  Guaifenesin (Robitussin)  Levothyroxine (Synthroid)  Losartan (Cozaar)  Magnesium  Melatonin  Montelukast (Singulair)  Multivitamin  Nystatin (Mycostatin)  Potassium (Klor-Con)  Topiramate (Topamax)  Torsemide (Demadex)  Trazodone (Desyrel)  Undefined Miscellaneous Drug  Venlafaxine  Vilanterol (Breo)  Vitamin C  Vitamin D3  Zinc ==================================================================== For clinical consultation, please call [phone]. ====================================================================      Refill Nucynta as below  Plan of Care    Imaging Orders     DG PAIN CLIN[facility] C-ARM 1-60 MIN NO REPORT Procedure Orders    No procedure(s) ordered today     Medications ordered  for procedure: Meds ordered this encounter  Medications  . lidocaine (XYLOCAINE) 2 % (with pres) injection 400 mg  . fentaNYL (SUBLIMAZE) injection 25-50 mcg    Make sure Narcan is available in the pyxis when using this medication. In the event of respiratory depression (RR< 8/min): Titrate NARCAN (naloxone) in increments of 0.1 to 0.2 mg IV at 2-3 minute intervals, until desired degree of reversal.  . ropivacaine (PF) 2 mg/mL (0.2%) (NAROPIN) injection 9 mL  . dexamethasone (DECADRON) injection 10 mg  . tapentadol (NUCYNTA) 50 MG tablet    Sig: Take 1 tablet (50 mg total) by mouth daily as needed for severe pain. Do not take if you have epilepsy or a history of seizures. .    Dispense:  30 tablet    Refill:  0    [place] STOP ACT - Not applicable. Fill one day early if pharmacy is closed on scheduled refill date  . tapentadol (NUCYNTA) 50 MG tablet    Sig: Take 1 tablet (50 mg total) by mouth daily as needed for severe pain. Do not take if you have epilepsy or a history of seizures. .    Dispense:  30 tablet    Refill:  0    [place] STOP ACT - Not applicable. Fill one day early if pharmacy is closed on scheduled refill date  . tapentadol (NUCYNTA) 50 MG tablet    Sig: Take 1 tablet (50 mg total) by mouth daily as needed for severe pain. Do not take if you have epilepsy or a history of seizures. .    Dispense:  30 tablet    Refill:  0     STOP ACT - Not applicable. Fill one day early if pharmacy is closed on scheduled refill date   Medications administered: We administered lidocaine, fentaNYL, ropivacaine (PF) 2 mg/mL (0.2%), and dexamethasone.  See the medical record for exact dosing, route, and time of administration.  New Prescriptions   TAPENTADOL (NUCYNTA) 50 MG TABLET    Take 1 tablet (50 mg total) by  mouth daily as needed for severe pain. Do not take if you have epilepsy or a history of seizures. Marland Kitchen   TAPENTADOL (NUCYNTA) 50 MG TABLET    Take 1 tablet (50 mg total) by mouth daily as needed for severe pain. Do not take if you have epilepsy or a history of seizures. Marland Kitchen   TAPENTADOL (NUCYNTA) 50 MG TABLET    Take 1 tablet (50 mg total) by mouth daily as needed for severe pain. Do not take if you have epilepsy or a history of seizures. .   Disposition: Discharge home  Discharge Date & Time: 09/30/2020;   hrs.   Physician-requested Follow-up: Return in about 3 months (around 12/30/2020) for Medication Management, Post Procedure Evaluation, in person.  Future Appointments  Date Time Provider Department Center  12/19/2020  3:00 PM Edward Jolly, MD [facility] None   Primary Care Physician: Lauro Regulus, MD Location: [facility] Note by: Edward Jolly, MD Date: 09/30/2020; Time: 2:25 PM  Disclaimer:  Medicine is not an exact science. The only guarantee in medicine is that nothing is guaranteed. It is important to note that the decision to proceed with this intervention was based on the information collected from the patient. The Data and conclusions were drawn from the patient's questionnaire, the interview, and the physical examination. Because the information was provided in large part by the patient, it cannot be guaranteed that it has not been  purposely or unconsciously manipulated. Every effort has been made to obtain as much relevant data as possible for this evaluation. It is important to note that the conclusions that lead to this procedure are derived in large part from the available data. Always take into account that the treatment will also be dependent on availability of resources and existing treatment guidelines, considered by other Pain Management Practitioners as being common knowledge and practice, at the time of the intervention. For Medico-Legal purposes,  it is also important to point out that variation in procedural techniques and pharmacological choices are the acceptable norm. The indications, contraindications, technique, and results of the above procedure should only be interpreted and judged by a Board-Certified Interventional Pain Specialist with extensive familiarity and expertise in the same exact procedure and technique.

## 2020-09-30 NOTE — Telephone Encounter (Signed)
There is no current script for Nucynta. Last written 07/2020. Patient will discuss with Dr. Cherylann Ratel at today's visit.

## 2020-09-30 NOTE — Patient Instructions (Signed)

## 2020-09-30 NOTE — Progress Notes (Signed)
Nursing Pain Medication Assessment:  Safety precautions to be maintained throughout the outpatient stay will include: orient to surroundings, keep bed in low position, maintain call bell within reach at all times, provide assistance with transfer out of bed and ambulation.  Medication Inspection Compliance: Pill count conducted under aseptic conditions, in front of the patient. Neither the pills nor the bottle was removed from the patient's sight at any time. Once count was completed pills were immediately returned to the patient in their original bottle.  Medication: Tapentadol (Nucynta) Pill/Patch Count: 2 of 30 pills remain Pill/Patch Appearance: Markings consistent with prescribed medication Bottle Appearance: Standard pharmacy container. Clearly labeled. Filled Date: 3 / 20 / 22 Last Medication intake:  TodaySafety precautions to be maintained throughout the outpatient stay will include: orient to surroundings, keep bed in low position, maintain call bell within reach at all times, provide assistance with transfer out of bed and ambulation.

## 2020-10-01 ENCOUNTER — Telehealth: Payer: Self-pay | Admitting: *Deleted

## 2020-10-01 NOTE — Telephone Encounter (Signed)
No problems post procedure. 

## 2020-10-07 ENCOUNTER — Telehealth: Payer: Self-pay | Admitting: Student in an Organized Health Care Education/Training Program

## 2020-10-07 NOTE — Telephone Encounter (Signed)
Called and notified patient of MD suggestion. Patient with understanding.

## 2020-10-07 NOTE — Telephone Encounter (Signed)
Patient called and states she is having 6/10 pain in her left buttock/ hip. This pain is not allowing her to get out of bed and walk. She is taking her pain med and Tylenol. I encouraged her to exercise the muscle and apply heat. Is there anything else you recommend? Would you like a virtual visit?

## 2020-10-07 NOTE — Telephone Encounter (Signed)
Patient called stating her back pain is better but her hip is really bothering her now and leg pain. Asking for a Nurse to call.

## 2020-10-16 ENCOUNTER — Telehealth: Payer: Self-pay

## 2020-10-16 ENCOUNTER — Other Ambulatory Visit: Payer: Self-pay | Admitting: Student in an Organized Health Care Education/Training Program

## 2020-10-16 DIAGNOSIS — M47816 Spondylosis without myelopathy or radiculopathy, lumbar region: Secondary | ICD-10-CM

## 2020-10-16 DIAGNOSIS — G894 Chronic pain syndrome: Secondary | ICD-10-CM

## 2020-10-16 MED ORDER — METHYLPREDNISOLONE 4 MG PO TBPK: ORAL_TABLET | ORAL | 0 refills | Status: AC

## 2020-10-16 NOTE — Telephone Encounter (Signed)
Bubble sent to Dr. Lateef. 

## 2020-10-16 NOTE — Telephone Encounter (Signed)
Per Dr. Cherylann Ratel:  Patient called and informed of steroid taper that has been called in, and she did confirm that she is still taking her Celebrex. Instructed to call us back prn.

## 2020-10-16 NOTE — Progress Notes (Signed)
Requested Prescriptions   Signed Prescriptions Disp Refills  . methylPREDNISolone (MEDROL) 4 MG TBPK tablet 21 tablet 0    Sig: Follow package instructions.

## 2020-10-16 NOTE — Telephone Encounter (Signed)
Her hip is not getting any better in spite of doubling her medication. It is so painful she can barely walk.

## 2020-10-21 ENCOUNTER — Telehealth: Payer: Self-pay | Admitting: Student in an Organized Health Care Education/Training Program

## 2020-10-21 DIAGNOSIS — M47816 Spondylosis without myelopathy or radiculopathy, lumbar region: Secondary | ICD-10-CM

## 2020-10-21 DIAGNOSIS — G894 Chronic pain syndrome: Secondary | ICD-10-CM

## 2020-10-21 NOTE — Telephone Encounter (Signed)
Spoke with patient.  She is out of her Nucynta.  On 10-07-2020 Dr Cherylann Ratel instructed her that she could take 2 pills for 5 days instead of 1 pill.  She is now out of the Nucynta and cant fill the next script until 10-30-2020.  Please advise

## 2020-10-22 ENCOUNTER — Encounter: Payer: Medicare Other | Admitting: Student in an Organized Health Care Education/Training Program

## 2020-10-22 MED ORDER — TAPENTADOL HCL 50 MG PO TABS: 50.0000 mg | ORAL_TABLET | Freq: Two times a day (BID) | ORAL | 0 refills | Status: AC | PRN

## 2020-10-22 NOTE — Telephone Encounter (Signed)
Patient notified

## 2020-10-30 ENCOUNTER — Other Ambulatory Visit: Payer: Self-pay | Admitting: Internal Medicine

## 2020-10-30 DIAGNOSIS — Z1231 Encounter for screening mammogram for malignant neoplasm of breast: Secondary | ICD-10-CM

## 2020-11-06 ENCOUNTER — Telehealth: Payer: Self-pay | Admitting: Student in an Organized Health Care Education/Training Program

## 2020-11-06 NOTE — Telephone Encounter (Signed)
Patient states her hip is still killing her.

## 2020-11-07 ENCOUNTER — Telehealth: Payer: Self-pay | Admitting: *Deleted

## 2020-11-07 NOTE — Telephone Encounter (Signed)
Called patient, voicemail left that if she feels she needs to be evaluated she would need to call to get on Dr Laurey Morale schedule.  I did let her know that he is out of the office next week.  Also, if there is more to this to please call me back and I would be happy to talk with her.

## 2020-11-07 NOTE — Telephone Encounter (Signed)
Attempted to call, voicemail left with patient.

## 2020-11-21 ENCOUNTER — Encounter: Payer: Medicare Other | Admitting: Student in an Organized Health Care Education/Training Program

## 2020-12-03 ENCOUNTER — Ambulatory Visit
Admission: RE | Admit: 2020-12-03 | Discharge: 2020-12-03 | Disposition: A | Discharge status: Home / Self Care | Payer: Medicare Other | Source: Ambulatory Visit | Attending: Internal Medicine | Admitting: Internal Medicine

## 2020-12-03 ENCOUNTER — Other Ambulatory Visit: Payer: Self-pay

## 2020-12-03 DIAGNOSIS — Z1231 Encounter for screening mammogram for malignant neoplasm of breast: Secondary | ICD-10-CM | POA: Diagnosis not present

## 2020-12-19 ENCOUNTER — Telehealth: Payer: Self-pay

## 2020-12-19 ENCOUNTER — Encounter: Payer: Medicare Other | Admitting: Student in an Organized Health Care Education/Training Program

## 2020-12-31 ENCOUNTER — Other Ambulatory Visit: Payer: Self-pay

## 2020-12-31 ENCOUNTER — Encounter: Payer: Self-pay | Admitting: Student in an Organized Health Care Education/Training Program

## 2020-12-31 ENCOUNTER — Ambulatory Visit
Payer: Medicare Other | Attending: Student in an Organized Health Care Education/Training Program | Admitting: Student in an Organized Health Care Education/Training Program

## 2020-12-31 VITALS — BP 143/88 | HR 82 | Temp 96.8°F | Resp 18 | Ht 66.0 in | Wt 297.0 lb

## 2020-12-31 DIAGNOSIS — M47816 Spondylosis without myelopathy or radiculopathy, lumbar region: Secondary | ICD-10-CM | POA: Insufficient documentation

## 2020-12-31 DIAGNOSIS — G894 Chronic pain syndrome: Secondary | ICD-10-CM | POA: Diagnosis present

## 2020-12-31 DIAGNOSIS — M62830 Muscle spasm of back: Secondary | ICD-10-CM | POA: Diagnosis present

## 2020-12-31 DIAGNOSIS — M5136 Other intervertebral disc degeneration, lumbar region: Secondary | ICD-10-CM | POA: Diagnosis present

## 2020-12-31 MED ORDER — TAPENTADOL HCL 50 MG PO TABS: 50.0000 mg | ORAL_TABLET | Freq: Every day | ORAL | 0 refills | Status: DC | PRN

## 2020-12-31 MED ORDER — TAPENTADOL HCL 50 MG PO TABS: 50.0000 mg | ORAL_TABLET | Freq: Every day | ORAL | 0 refills | Status: AC | PRN

## 2020-12-31 NOTE — Progress Notes (Signed)
Nursing Pain Medication Assessment:  Safety precautions to be maintained throughout the outpatient stay will include: orient to surroundings, keep bed in low position, maintain call bell within reach at all times, provide assistance with transfer out of bed and ambulation.  Medication Inspection Compliance: Pill count conducted under aseptic conditions, in front of the patient. Neither the pills nor the bottle was removed from the patient's sight at any time. Once count was completed pills were immediately returned to the patient in their original bottle.  Medication: Tapentadol (Nucynta) Pill/Patch Count:  5 of 30 pills remain Pill/Patch Appearance: Markings consistent with prescribed medication Bottle Appearance: Standard pharmacy container. Clearly labeled. Filled Date: 06 / 22 / 2022 Last Medication intake:  Today

## 2020-12-31 NOTE — Progress Notes (Signed)
PROVIDER NOTE: Information contained herein reflects review and annotations entered in association with encounter. Interpretation of such information and data should be left to medically-trained personnel. Information provided to patient can be located elsewhere in the medical record under "Patient Instructions". Document created using STT-dictation technology, any transcriptional errors that may result from process are unintentional.    Patient: [name]  Service Category: E/M  Provider: Gillis Santa, MD  DOB: 08/31/1945  DOS: 12/31/2020  Specialty: Interventional Pain Management  MRN: [id number]  Setting: Ambulatory outpatient  PCP: Kirk Ruths, MD  Type: Established Patient    Referring Provider: Kirk Ruths, MD  Location: Office  Delivery: Face-to-face     HPI  [name], a 75 y.o. year old female, is here today because of her Lumbar facet arthropathy [M47.816]. [name]'s primary complain today is Back Pain Last encounter: My last encounter with her was on 09/30/2020. Pertinent problems: [name] has Lumbar spondylosis; Lumbar facet arthropathy; Lumbar degenerative disc disease; Spasm of lumbar paraspinous muscle; and Chronic pain syndrome on their pertinent problem list. Pain Assessment: Severity of Chronic pain is reported as a 2 /10. Location: Back Lower/radiates down right leg to knee. Onset: More than a month ago. Quality: Aching, Sharp. Timing: Constant. Modifying factor(s): rest. Vitals:  height is _0  (1.676 m) and weight is 297 lb (134.7 kg). Her temperature is 96.8 F (36 C) (abnormal). Her blood pressure is 143/88 (abnormal) and her pulse is 82. Her respiration is 18 and oxygen saturation is 97%.   Reason for encounter: medication management.    Patient presents for medication management. No change in dose.  No side effects with her medication.  They do help with her chronic low back pain  and help her function better.  I will refill her Nucynta as below, 50 mg daily. She states that her low back is doing well after her lumbar radiofrequency ablation although she continues to have right hip and right leg pain that is dermatomal in nature. She is having more frequent incontinence and is requesting to see a neurosurgeon. She has also had shoulder injections with orthopedics   Pharmacotherapy Assessment   Analgesic: Nucynta 50 mg daily, quantity 30/month.     Monitoring: [place] PMP: PDMP reviewed during this encounter.       Pharmacotherapy: No side-effects or adverse reactions reported. Compliance: No problems identified. Effectiveness: Clinically acceptable.  Dewayne Shorter, RN  12/31/2020 10:09 AM  Sign when Signing Visit Nursing Pain Medication Assessment:  Safety precautions to be maintained throughout the outpatient stay will include: orient to surroundings, keep bed in low position, maintain call bell within reach at all times, provide assistance with transfer out of bed and ambulation.  Medication Inspection Compliance: Pill count conducted under aseptic conditions, in front of the patient. Neither the pills nor the bottle was removed from the patient's sight at any time. Once count was completed pills were immediately returned to the patient in their original bottle.  Medication: Tapentadol (Nucynta) Pill/Patch Count:  5 of 30 pills remain Pill/Patch Appearance: Markings consistent with prescribed medication Bottle Appearance: Standard pharmacy container. Clearly labeled. Filled Date: 06 / 22 / 2022 Last Medication intake:  Today      UDS:  Summary  Date Value Ref Range Status  08/01/2020 Note  Final    Comment:    ==================================================================== [employer] 13 (MW) ==================================================================== Test  Result       Flag       Units  Drug Present and Declared  for Prescription Verification   Tapentadol                     5398         EXPECTED   ng/mg creat    Source of tapentadol is a scheduled prescription medication.  ==================================================================== Test                      Result    Flag   Units      Ref Range   Creatinine              158              mg/dL      >=20 ==================================================================== Declared Medications:  The flagging and interpretation on this report are based on the  following declared medications.  Unexpected results may arise from  inaccuracies in the declared medications.   **Note: The testing scope of this panel includes these medications:   Tapentadol (Nucynta)   **Note: The testing scope of this panel does not include the  following reported medications:   Acetaminophen (Tylenol)  Albuterol  Amantadine  Amlodipine (Norvasc)  Aripiprazole (Abilify)  Aspirin  Azelastine (Astelin)  Baclofen (Lioresal)  Biotin  Carvedilol (Coreg)  Celecoxib (Celebrex)  Donepezil (Aricept)  Esomeprazole (Nexium)  Fluticasone (Breo)  Fluticasone (Flonase)  Folic Acid  Guaifenesin (Robitussin)  Levothyroxine (Synthroid)  Losartan (Cozaar)  Magnesium  Melatonin  Montelukast (Singulair)  Multivitamin  Nystatin (Mycostatin)  Potassium (Klor-Con)  Topiramate (Topamax)  Torsemide (Demadex)  Trazodone (Desyrel)  Undefined Miscellaneous Drug  Venlafaxine  Vilanterol (Breo)  Vitamin C  Vitamin D3  Zinc ==================================================================== For clinical consultation, please call [phone]. ====================================================================      ROS  Constitutional: Denies any fever or chills Gastrointestinal: No reported hemesis, hematochezia, vomiting, or acute GI distress Musculoskeletal:  Low back pain related to lumbar facet arthropathy Neurological: No reported episodes of acute  onset apraxia, aphasia, dysarthria, agnosia, amnesia, paralysis, loss of coordination, or loss of consciousness  Medication Review  ARIPiprazole, Biotin, OnabotulinumtoxinA, Semaglutide, Venlafaxine HCl, Vitamin D3, acetaminophen, albuterol, amLODipine, amantadine, aspirin EC, azelastine, baclofen, carvedilol, celecoxib, donepezil, esomeprazole, fluticasone, fluticasone furoate-vilanterol, folic acid, guaiFENesin-dextromethorphan, levothyroxine, losartan, magnesium, melatonin, montelukast, multivitamin, nystatin, potassium chloride, potassium chloride SA, tapentadol, topiramate, torsemide, traZODone, vitamin C, and zinc gluconate  History Review  Allergy: [name] is allergic to codeine, povidone iodine, hydromorphone, latex, oxycodone-acetaminophen, sulfa antibiotics, bupropion, gabapentin, hydrocodone, morphine and related, and procaine. Drug: [name]  reports no history of drug use. Alcohol:  reports current alcohol use of about 2.0 standard drinks of alcohol per week. Tobacco:  reports that she has never smoked. She has never used smokeless tobacco. Social: [name]  reports that she has never smoked. She has never used smokeless tobacco. She reports current alcohol use of about 2.0 standard drinks of alcohol per week. She reports that she does not use drugs. 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 Surgical: [name]  has a past surgical history that includes Ankle reconstruction (Left); Brachioplasty; Cataract extraction; Cholecystectomy; Soft Tissue Tumor Resection; Abdominal  hysterectomy (  07/12/2014); Joint replacement (Bilateral, 07/2014, 03/2015); nasal reconstrucion; Toe Surgery (Right); Shoulder open rotator cuff repair; Colonoscopy with propofol (N/A, 12/07/2016); Reduction mammaplasty (Bilateral); and Breast reduction surgery (Bilateral, 2020). Family: family history includes Breast cancer in her paternal aunt; Lung cancer in her father and mother.  Laboratory Chemistry Profile   Renal Lab Results  Component Value Date   BUN 25 (H) 07/06/2020   CREATININE 0.99 07/06/2020   GFRNONAA 60 (L) 07/06/2020     Hepatic Lab Results  Component Value Date   AST 25 07/02/2020   ALT 21 07/02/2020   ALBUMIN 3.7 07/02/2020   ALKPHOS 80 07/02/2020     Electrolytes Lab Results  Component Value Date   NA 136 07/06/2020   K 4.1 07/06/2020   CL 98 07/06/2020   CALCIUM 9.0 07/06/2020   MG 2.0 07/06/2020   PHOS 3.0 07/06/2020     Bone No results found for: VD25OH, VZ563OV5IEP, PI9518AC1, YS0630ZS0, 25OHVITD1, 25OHVITD2, 25OHVITD3, TESTOFREE, TESTOSTERONE   Inflammation (CRP: Acute Phase) (ESR: Chronic Phase) Lab Results  Component Value Date   LATICACIDVEN 1.1 07/02/2020       Note: Above Lab results reviewed.  Recent Imaging Review  MM 3D SCREEN BREAST BILATERAL CLINICAL DATA:  Screening.  EXAM: DIGITAL SCREENING BILATERAL MAMMOGRAM WITH TOMOSYNTHESIS AND CAD  TECHNIQUE: Bilateral screening digital craniocaudal and mediolateral oblique mammograms were obtained. Bilateral screening digital breast tomosynthesis was performed. The images were evaluated with computer-aided detection.  COMPARISON:  Previous exam(s).  ACR Breast Density Category a: The breast tissue is almost entirely fatty.  FINDINGS: There are no findings suspicious for malignancy.  IMPRESSION: No mammographic evidence of malignancy. A result letter of this screening mammogram will be mailed directly to the patient.  RECOMMENDATION: Screening mammogram in one year.  (Code:SM-B-01Y)  BI-RADS CATEGORY  1: Negative.  Electronically Signed   By: Valentino Saxon MD   On: 12/04/2020 08:00 Note: Reviewed        Physical Exam  General appearance: Well nourished, well developed, and well hydrated. In no apparent acute distress Mental status: Alert, oriented x 3 (person, place, & time)       Respiratory: No evidence of acute respiratory distress Eyes: PERLA Vitals: BP (!) 143/88 (BP Location: Right Arm, Patient Position: Sitting, Cuff Size: Large)   Pulse 82   Temp (!) 96.8 F (36 C)   Resp 18   Ht _0  (1.676 m)   Wt 297 lb (134.7 kg)   SpO2 97%   BMI 47.94 kg/m  BMI: Estimated body mass index is 47.94 kg/m as calculated from the following:   Height as of this encounter: _1  (1.676 m).   Weight as of this encounter: 297 lb (134.7 kg). Ideal: Ideal body weight: 59.3 kg (130 lb 11.7 oz) Adjusted ideal body weight: 89.5 kg (197 lb 3.8 oz)  Low back pain, improved since RFA Radiating right leg, right hip pain Antalgic gait  Assessment   Diagnosis  1. Lumbar facet arthropathy   2. Lumbar degenerative disc disease   3. Spasm of lumbar paraspinous muscle   4. Lumbar spondylosis   5. Chronic pain syndrome        Plan of Care   Ms. Kajsa J Igarashi has a current medication list which includes the following long-term medication(s): albuterol, amantadine, amlodipine, aripiprazole, azelastine, carvedilol, donepezil, esomeprazole, levothyroxine, losartan, montelukast, potassium chloride, potassium chloride sa, torsemide, trazodone, and venlafaxine hcl.   1.  Refill Nucynta as below.  No change in dose 2.  Continue psychiatric medications.  Management of depression and anxiety can also impact chronic pain experience 3.  Referral to neurosurgery given more frequent episodes of incontinence and worsening right lumbar radicular pain  Requested Prescriptions   Signed Prescriptions Disp Refills   tapentadol (NUCYNTA) 50 MG tablet  30 tablet 0    Sig: Take 1 tablet (50 mg total) by mouth daily as needed for moderate pain or severe pain.   tapentadol (NUCYNTA) 50 MG tablet 30 tablet 0    Sig: Take 1 tablet (50 mg total) by mouth daily as needed for moderate pain or severe pain.   tapentadol (NUCYNTA) 50 MG tablet 30 tablet 0    Sig: Take 1 tablet (50 mg total) by mouth daily as needed for moderate pain or severe pain.   Orders:  Orders Placed This Encounter  Procedures   Ambulatory referral to Neurosurgery    Referral Priority:   Routine    Referral Type:   Surgical    Referral Reason:   Specialty Services Required    Referred to Provider:   Deetta Perla, MD    Requested Specialty:   Neurosurgery    Number of Visits Requested:   1   Follow-up plan:   Return in about 3 months (around 04/02/2021) for Medication Management, in person.     Status post left L3, L4, L5 RFA 12/01/2017, 12/14/2018, 06/21/2019.  Status post right L3, L4, L5 RFA on 12/22/2017, 12/28/2018, 06/21/2019, 07/10/2019 helps decrease her pain and improve her functional status for axial low back for approximately 6 to 8 months post RFA.  Can repeat every 6 to 12 months.  Left L4 Sprint peripheral nerve stimulation 03/13/2020, lead displacement/migration, lead removed and left L4 medial branch nerve PNS replacement on 05/15/2020, became dislodged again early February.   Right L3, L4, L5 RFA 09/30/2020, left L3, L4, L5 RFA 08/14/2020    Recent Visits No visits were found meeting these conditions. Showing recent visits within past 90 days and meeting all other requirements Today's Visits Date Type Provider Dept  12/31/20 Office Visit Gillis Santa, MD [facility]  Showing today's visits and meeting all other requirements Future Appointments No visits were found meeting these conditions. Showing future appointments within next 90 days and meeting all other requirements I discussed the assessment and treatment plan with the patient. The patient was provided  an opportunity to ask questions and all were answered. The patient agreed with the plan and demonstrated an understanding of the instructions.  Patient advised to call back or seek an in-person evaluation if the symptoms or condition worsens.  Duration of encounter:30 minutes.  Note by: Gillis Santa, MD Date: 12/31/2020; Time: 10:34 AM

## 2021-01-27 ENCOUNTER — Telehealth: Payer: Self-pay

## 2021-01-27 ENCOUNTER — Telehealth: Payer: Self-pay | Admitting: *Deleted

## 2021-01-27 NOTE — Telephone Encounter (Signed)
Pt wants a procedure says she is in a lot of pain. In lower back down the hip and down the left leg and is unsure of what to do.

## 2021-01-28 ENCOUNTER — Telehealth: Payer: Self-pay | Admitting: *Deleted

## 2021-01-28 NOTE — Telephone Encounter (Signed)
Message resent to TY, also bubble and verbal communication to schedule patient for VV 01/29/21

## 2021-01-29 ENCOUNTER — Ambulatory Visit
Payer: Medicare Other | Attending: Student in an Organized Health Care Education/Training Program | Admitting: Student in an Organized Health Care Education/Training Program

## 2021-01-29 ENCOUNTER — Other Ambulatory Visit: Payer: Self-pay

## 2021-01-29 DIAGNOSIS — M5136 Other intervertebral disc degeneration, lumbar region: Secondary | ICD-10-CM

## 2021-01-29 DIAGNOSIS — M47816 Spondylosis without myelopathy or radiculopathy, lumbar region: Secondary | ICD-10-CM

## 2021-01-29 DIAGNOSIS — G894 Chronic pain syndrome: Secondary | ICD-10-CM | POA: Diagnosis not present

## 2021-01-29 MED ORDER — PREDNISONE 20 MG PO TABS: ORAL_TABLET | ORAL | 0 refills | Status: AC

## 2021-01-29 NOTE — Progress Notes (Signed)
Patient: [name]  Service Category: E/M  Provider: Gillis Santa, MD  DOB: 1945-07-21  DOS: 01/29/2021  Location: Office  MRN: [id number]  Setting: Ambulatory outpatient  Referring Provider: Kirk Ruths, MD  Type: Established Patient  Specialty: Interventional Pain Management  PCP: Robin Ruths, MD  Location: Home  Delivery: TeleHealth     Virtual Encounter - Pain Management PROVIDER NOTE: Information contained herein reflects review and annotations entered in association with encounter. Interpretation of such information and data should be left to medically-trained personnel. Information provided to patient can be located elsewhere in the medical record under "Patient Instructions". Document created using STT-dictation technology, any transcriptional errors that may result from process are unintentional.    Contact & Pharmacy Preferred: [phone] Home: [phone] (home) Mobile: [phone] (mobile) E-mail: [email or web address]  [id number] [name], [name] [address] [place] [address] Phone: [phone] Fax: [phone]  [id number] - [place], [place] - [address] [place] [address] Phone: [phone] Fax: [phone]   Pre-screening  [name] offered "in-person" vs "virtual" encounter. She indicated preferring virtual for this encounter.   Reason COVID-19*  Social distancing based on CDC and AMA recommendations.   I contacted [name] on 01/29/2021 via video conference.      I clearly identified myself as Robin Santa, MD. I verified that I was speaking with the correct person using two identifiers (Name: [name], and date of birth: 08-Nov-1945).  Consent I sought verbal advanced consent from [name] for virtual visit interactions. I informed [name] of possible security and privacy concerns, risks, and limitations associated with providing "not-in-person" medical evaluation and management services. I also informed [name] of the availability of "in-person" appointments. Finally, I informed her that there would be a charge for the virtual visit and that she could be  personally, fully or partially, financially responsible for it. [name] expressed understanding and agreed to proceed.   Historic Elements   [name] is a 75 y.o. year old, female patient evaluated today after our last contact on 12/31/2020. 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 She also  has a past surgical history that includes Ankle reconstruction (Left); Brachioplasty; Cataract extraction; Cholecystectomy; Soft Tissue Tumor Resection; Abdominal hysterectomy (07/12/2014); Joint replacement (Bilateral, 07/2014, 03/2015); nasal reconstrucion; Toe Surgery (Right); Shoulder open rotator cuff repair; Colonoscopy with propofol (N/A, 12/07/2016); Reduction mammaplasty (Bilateral); and Breast reduction surgery (Bilateral, 2020). [name] has a current medication list which includes the following prescription(s): prednisone, acetaminophen, albuterol, amantadine, amlodipine, aripiprazole, vitamin c, aspirin ec, azelastine, baclofen, biotin, carvedilol, celecoxib, vitamin d3, donepezil, esomeprazole, fluticasone, fluticasone furoate-vilanterol, folic acid,  guaifenesin-dextromethorphan, levothyroxine, losartan, magnesium, melatonin, montelukast, multivitamin, nystatin, onabotulinumtoxina, potassium chloride, potassium chloride sa, rybelsus, tapentadol, [START ON 02/02/2021] tapentadol, [START ON 03/04/2021] tapentadol, topiramate, torsemide, trazodone, venlafaxine hcl, and zinc gluconate. She  reports that she has never smoked. She has never used smokeless tobacco. She reports current alcohol use of about 2.0 standard drinks per week. She reports  that she does not use drugs. [name] is allergic to codeine, povidone iodine, hydromorphone, latex, oxycodone-acetaminophen, sulfa antibiotics, bupropion, gabapentin, hydrocodone, morphine and related, and procaine.   HPI  Today, she is being contacted for worsening of previously known (established) problem  Patient is complaining of increased left lower lumbar spine pain.  She states that this pain is staying in her lumbar spine and occasionally radiates to her buttocks as well.  She has having difficulty walking.  She continues her multimodal analgesics as below.  She is taking her Nucynta as prescribed.  Her previous lumbar radiofrequency ablation was performed on 08/14/2020.  This provided her with approximately 90% pain relief until about 2 weeks ago.  We discussed repeating left L3, L4, L5 radiofrequency ablation as she has these done every 6 to 12 months and they seem to significantly help decrease her lower lumbar pain and enhance her functional status as well.  We also discussed a prednisone taper for her acute on chronic pain. Plan to repeat left L3, L4, L5 RFA anytime after 02/14/2021.  Pharmacotherapy Assessment   Analgesic: Nucynta 50 mg daily, quantity 30/month.     Monitoring: [place] PMP: PDMP not reviewed this encounter.       Pharmacotherapy: No side-effects or adverse reactions reported. Compliance: No problems identified. Effectiveness: Clinically acceptable. Plan: Refer to "POC".  UDS:  Summary  Date Value Ref Range Status  08/01/2020 Note  Final    Comment:    ==================================================================== Tox[employer] Select 13 (MW) ==================================================================== Test                             Result       Flag       Units  Drug Present and Declared for Prescription Verification   Tapentadol                     5398         EXPECTED   ng/mg creat    Source of tapentadol is a scheduled prescription medication.  ==================================================================== Test                      Result    Flag   Units      Ref Range   Creatinine              158              mg/dL      >=20 ==================================================================== Declared Medications:  The flagging and interpretation on this report are based on the  following declared medications.  Unexpected results may arise from  inaccuracies in the declared medications.   **Note: The testing scope of this panel includes these medications:   Tapentadol (Nucynta)   **Note: The testing scope of this panel does not include the  following reported medications:   Acetaminophen (Tylenol)  Albuterol  Amantadine  Amlodipine (Norvasc)  Aripiprazole (Abilify)  Aspirin  Azelastine (Astelin)  Baclofen (Lioresal)  Biotin  Carvedilol (Coreg)  Celecoxib (Celebrex)  Donepezil (Aricept)  Esomeprazole (Nexium)  Fluticasone (Breo)  Fluticasone (Flonase)  Folic Acid  Guaifenesin (Robitussin)  Levothyroxine (Synthroid)  Losartan (Cozaar)  Magnesium  Melatonin  Montelukast (Singulair)  Multivitamin  Nystatin (Mycostatin)  Potassium (Klor-Con)  Topiramate (Topamax)  Torsemide (Demadex)  Trazodone (Desyrel)  Undefined Miscellaneous Drug  Venlafaxine  Vilanterol (Breo)  Vitamin C  Vitamin D3  Zinc ==================================================================== For clinical consultation, please call  [phone]. ====================================================================      Laboratory Chemistry Profile   Renal Lab Results  Component Value Date   BUN 25 (H) 07/06/2020   CREATININE 0.99 07/06/2020   GFRNONAA 60 (L) 07/06/2020    Hepatic Lab Results  Component Value Date   AST 25 07/02/2020   ALT 21 07/02/2020   ALBUMIN 3.7 07/02/2020   ALKPHOS 80 07/02/2020    Electrolytes Lab Results  Component Value Date   NA 136 07/06/2020   K 4.1 07/06/2020   CL 98 07/06/2020   CALCIUM 9.0 07/06/2020   MG 2.0 07/06/2020   PHOS 3.0 07/06/2020    Bone No results found for: VD25OH, [id number], XB9390ZE0, PQ3300TM2, 25OHVITD1, 25OHVITD2, 25OHVITD3, TESTOFREE, TESTOSTERONE  Inflammation (CRP: Acute Phase) (ESR: Chronic Phase) Lab Results  Component Value Date   LATICACIDVEN 1.1 07/02/2020         Note: Above Lab results reviewed.  Imaging  MM 3D SCREEN BREAST BILATERAL CLINICAL DATA:  Screening.  EXAM: DIGITAL SCREENING BILATERAL MAMMOGRAM WITH TOMOSYNTHESIS AND CAD  TECHNIQUE: Bilateral screening digital craniocaudal and mediolateral oblique mammograms were obtained. Bilateral screening digital breast tomosynthesis was performed. The images were evaluated with computer-aided detection.  COMPARISON:  Previous exam(s).  ACR Breast Density Category a: The breast tissue is almost entirely fatty.  FINDINGS: There are no findings suspicious for malignancy.  IMPRESSION: No mammographic evidence of malignancy. A result letter of this screening mammogram will be mailed directly to the patient.  RECOMMENDATION: Screening mammogram in one year. (Code:[id number]-01Y)  BI-RADS CATEGORY  1: Negative.  Electronically Signed   By: Valentino Saxon MD   On: 12/04/2020 08:00  Assessment  The primary encounter diagnosis was Lumbar facet arthropathy. Diagnoses of Lumbar degenerative disc disease, Lumbar spondylosis, and Chronic pain syndrome were also pertinent to  this visit.  Plan of Care    Ms. Neeka J Hollifield [name] has a current medication list which includes the following long-term medication(s): albuterol, amantadine, amlodipine, aripiprazole, azelastine, carvedilol, donepezil, esomeprazole, levothyroxine, losartan, montelukast, potassium chloride, potassium chloride sa, torsemide, trazodone, and venlafaxine hcl.  Pharmacotherapy (Medications Ordered): Meds ordered this encounter  Medications   predniSONE (DELTASONE) 20 MG tablet    Sig: Take 3 tablets (60 mg total) by mouth daily with breakfast for 3 days, THEN 2 tablets (40 mg total) daily with breakfast for 3 days, THEN 1 tablet (20 mg total) daily with breakfast for 3 days.    Dispense:  18 tablet    Refill:  0   Orders:  Orders Placed This Encounter  Procedures   Radiofrequency,Lumbar    Standing Status:   Future    Standing Expiration Date:   08/01/2021    Scheduling Instructions:     Side(s): LEFT     Level(s): L3, L4, L5, Medial Branch Nerve(s)     Sedation: With Sedation     Scheduling Timeframe: As soon as pre-approved    Order Specific Question:   Where will this procedure be performed?    Answer:   [facility]   Follow-up plan:   Return in about 3 weeks (around 02/19/2021) for Left L3, 4, 5 RFA , moderate sedation (ECT suite).     Status post left L3, L4, L5 RFA 12/01/2017, 12/14/2018, 06/21/2019.  Status post right L3, L4, L5 RFA on 12/22/2017, 12/28/2018, 06/21/2019, 07/10/2019 helps decrease her pain and improve her functional status for axial low back for approximately 6 to 8 months post RFA.  Can repeat every 6 to 12 months.  Left L4 Sprint peripheral nerve stimulation 03/13/2020, lead displacement/migration, lead removed and left L4 medial branch nerve PNS replacement on 05/15/2020, became dislodged again early February.   Right L3, L4, L5 RFA 09/30/2020, left L3, L4, L5 RFA 08/14/2020     Recent Visits Date Type Provider Dept  12/31/20 Office Visit Robin Santa, MD  [facility]  Showing recent visits within past 90 days and meeting all other requirements Today's Visits Date Type Provider Dept  01/29/21 Telemedicine Robin Santa, MD [facility]  Showing today's visits and meeting all other requirements Future Appointments Date Type Provider Dept  04/01/21 Appointment Robin Santa, MD [facility]  Showing future appointments within next 90 days and meeting all other requirements I discussed the assessment and treatment plan with the patient. The patient was provided an opportunity to ask questions and all were answered. The patient agreed with the plan and demonstrated an understanding of the instructions.  Patient advised to call back or seek an in-person evaluation if the symptoms or condition worsens.  Duration of encounter: 56mnutes.  Note by: BGillis Santa MD Date: 01/29/2021; Time: 3:39 PM

## 2021-02-19 ENCOUNTER — Encounter: Payer: Self-pay | Admitting: Student in an Organized Health Care Education/Training Program

## 2021-02-19 ENCOUNTER — Ambulatory Visit
Admission: RE | Admit: 2021-02-19 | Discharge: 2021-02-19 | Disposition: A | Discharge status: Home / Self Care | Payer: Medicare Other | Source: Ambulatory Visit | Attending: Student in an Organized Health Care Education/Training Program | Admitting: Student in an Organized Health Care Education/Training Program

## 2021-02-19 ENCOUNTER — Ambulatory Visit (HOSPITAL_BASED_OUTPATIENT_CLINIC_OR_DEPARTMENT_OTHER): Payer: Medicare Other | Admitting: Student in an Organized Health Care Education/Training Program

## 2021-02-19 ENCOUNTER — Other Ambulatory Visit: Payer: Self-pay

## 2021-02-19 VITALS — BP 158/86 | HR 81 | Temp 97.3°F | Resp 13 | Ht 66.0 in | Wt 287.0 lb

## 2021-02-19 DIAGNOSIS — G894 Chronic pain syndrome: Secondary | ICD-10-CM

## 2021-02-19 DIAGNOSIS — M47816 Spondylosis without myelopathy or radiculopathy, lumbar region: Secondary | ICD-10-CM

## 2021-02-19 MED ORDER — ROPIVACAINE HCL 2 MG/ML IJ SOLN
9.0000 mL | Freq: Once | INTRAMUSCULAR | Status: AC
  Administered 2021-02-19: 9 mL via PERINEURAL

## 2021-02-19 MED ORDER — FENTANYL CITRATE (PF) 100 MCG/2ML IJ SOLN
INTRAMUSCULAR | Status: AC
  Administered 2021-02-19: 50 ug
  Filled 2021-02-19: qty 2

## 2021-02-19 MED ORDER — FENTANYL CITRATE (PF) 100 MCG/2ML IJ SOLN: 25.0000 ug | INTRAMUSCULAR | Status: DC | PRN

## 2021-02-19 MED ORDER — DEXAMETHASONE SODIUM PHOSPHATE 10 MG/ML IJ SOLN: 10.0000 mg | Freq: Once | INTRAMUSCULAR | Status: DC

## 2021-02-19 MED ORDER — ROPIVACAINE HCL 2 MG/ML IJ SOLN
INTRAMUSCULAR | Status: AC
  Filled 2021-02-19: qty 20

## 2021-02-19 MED ORDER — LIDOCAINE HCL (PF) 2 % IJ SOLN
INTRAMUSCULAR | Status: AC
  Filled 2021-02-19: qty 10

## 2021-02-19 MED ORDER — IOHEXOL 180 MG/ML  SOLN: 10.0000 mL | Freq: Once | INTRAMUSCULAR | Status: DC

## 2021-02-19 MED ORDER — LIDOCAINE HCL 2 % IJ SOLN
20.0000 mL | Freq: Once | INTRAMUSCULAR | Status: AC
  Administered 2021-02-19: 100 mg

## 2021-02-19 MED ORDER — DEXAMETHASONE SODIUM PHOSPHATE 10 MG/ML IJ SOLN
INTRAMUSCULAR | Status: AC
  Administered 2021-02-19: 10 mg
  Filled 2021-02-19: qty 1

## 2021-02-19 NOTE — Progress Notes (Signed)
Safety precautions to be maintained throughout the outpatient stay will include: orient to surroundings, keep bed in low position, maintain call bell within reach at all times, provide assistance with transfer out of bed and ambulation.  

## 2021-02-19 NOTE — Patient Instructions (Signed)
____________________________________________________________________________________________  Post-Procedure Discharge Instructions  Instructions: Apply ice:  Purpose: This will minimize any swelling and discomfort after procedure.  When: Day of procedure, as soon as you get home. How: Fill a plastic sandwich bag with crushed ice. Cover it with a small towel and apply to injection site. How long: (15 min on, 15 min off) Apply for 15 minutes then remove x 15 minutes.  Repeat sequence on day of procedure, until you go to bed. Apply heat:  Purpose: To treat any soreness and discomfort from the procedure. When: Starting the next day after the procedure. How: Apply heat to procedure site starting the day following the procedure. How long: May continue to repeat daily, until discomfort goes away. Food intake: Start with clear liquids (like water) and advance to regular food, as tolerated.  Physical activities: Keep activities to a minimum for the first 8 hours after the procedure. After that, then as tolerated. Driving: If you have received any sedation, be responsible and do not drive. You are not allowed to drive for 24 hours after having sedation. Blood thinner: (Applies only to those taking blood thinners) You may restart your blood thinner 6 hours after your procedure. Insulin: (Applies only to Diabetic patients taking insulin) As soon as you can eat, you may resume your normal dosing schedule. Infection prevention: Keep procedure site clean and dry. Shower daily and clean area with soap and water. Post-procedure Pain Diary: Extremely important that this be done correctly and accurately. Recorded information will be used to determine the next step in treatment. For the purpose of accuracy, follow these rules: Evaluate only the area treated. Do not report or include pain from an untreated area. For the purpose of this evaluation, ignore all other areas of pain, except for the treated area. After  your procedure, avoid taking a long nap and attempting to complete the pain diary after you wake up. Instead, set your alarm clock to go off every hour, on the hour, for the initial 8 hours after the procedure. Document the duration of the numbing medicine, and the relief you are getting from it. Do not go to sleep and attempt to complete it later. It will not be accurate. If you received sedation, it is likely that you were given a medication that may cause amnesia. Because of this, completing the diary at a later time may cause the information to be inaccurate. This information is needed to plan your care. Follow-up appointment: Keep your post-procedure follow-up evaluation appointment after the procedure (usually 2 weeks for most procedures, 6 weeks for radiofrequencies). DO NOT FORGET to bring you pain diary with you.   Expect: (What should I expect to see with my procedure?) From numbing medicine (AKA: Local Anesthetics): Numbness or decrease in pain. You may also experience some weakness, which if present, could last for the duration of the local anesthetic. Onset: Full effect within 15 minutes of injected. Duration: It will depend on the type of local anesthetic used. On the average, 1 to 8 hours.  From steroids (Applies only if steroids were used): Decrease in swelling or inflammation. Once inflammation is improved, relief of the pain will follow. Onset of benefits: Depends on the amount of swelling present. The more swelling, the longer it will take for the benefits to be seen. In some cases, up to 10 days. Duration: Steroids will stay in the system x 2 weeks. Duration of benefits will depend on multiple posibilities including persistent irritating factors. Side-effects: If present, they   may typically last 2 weeks (the duration of the steroids). Frequent: Cramps (if they occur, drink Gatorade and take over-the-counter Magnesium 450-500 mg once to twice a day); water retention with temporary  weight gain; increases in blood sugar; decreased immune system response; increased appetite. Occasional: Facial flushing (red, warm cheeks); mood swings; menstrual changes. Uncommon: Long-term decrease or suppression of natural hormones; bone thinning. (These are more common with higher doses or more frequent use. This is why we prefer that our patients avoid having any injection therapies in other practices.)  Very Rare: Severe mood changes; psychosis; aseptic necrosis. From procedure: Some discomfort is to be expected once the numbing medicine wears off. This should be minimal if ice and heat are applied as instructed.  Call if: (When should I call?) You experience numbness and weakness that gets worse with time, as opposed to wearing off. New onset bowel or bladder incontinence. (Applies only to procedures done in the spine)  Emergency Numbers: Durning business hours (Monday - Thursday, 8:00 AM - 4:00 PM) (Friday, 9:00 AM - 12:00 Noon): [phone] After hours: [phone] NOTE: If you are having a problem and are unable connect with, or to talk to a provider, then go to your nearest urgent care or emergency department. If the problem is serious and urgent, please call 911. ____________________________________________________________________________________________  ___________________________________________________________________________________________  Post-Radiofrequency (RF) Discharge Instructions  You have just completed a Radiofrequency Neurotomy.  The following instructions will provide you with information and guidelines for self-care upon discharge.  If at any time you have questions or concerns please call your physician. DO NOT DRIVE YOURSELF!!  Instructions: Apply ice: Fill a plastic sandwich bag with crushed ice. Cover it with a small towel and apply to injection site. Apply for 15 minutes then remove x 15 minutes. Repeat sequence on day of procedure, until you go to  bed. The purpose is to minimize swelling and discomfort after procedure. Apply heat: Apply heat to procedure site starting the day following the procedure. The purpose is to treat any soreness and discomfort from the procedure. Food intake: No eating limitations, unless stipulated above.  Nevertheless, if you have had sedation, you may experience some nausea.  In this case, it may be wise to wait at least two hours prior to resuming regular diet. Physical activities: Keep activities to a minimum for the first 8 hours after the procedure. For the first 24 hours after the procedure, do not drive a motor vehicle,  Operate heavy machinery, power tools, or handle any weapons.  Consider walking with the use of an assistive device or accompanied by an adult for the first 24 hours.  Do not drink alcoholic beverages including beer.  Do not make any important decisions or sign any legal documents. Go home and rest today.  Resume activities tomorrow, as tolerated.  Use caution in moving about as you may experience mild leg weakness.  Use caution in cooking, use of household electrical appliances and climbing steps. Driving: If you have received any sedation, you are not allowed to drive for 24 hours after your procedure. Blood thinner: Restart your blood thinner 6 hours after your procedure. (Only for those taking blood thinners) Insulin: As soon as you can eat, you may resume your normal dosing schedule. (Only for those taking insulin) Medications: May resume pre-procedure medications.  Do not take any drugs, other than what has been prescribed to you. Infection prevention: Keep procedure site clean and dry. Post-procedure Pain Diary: Extremely important that this be   done correctly and accurately. Recorded information will be used to determine the next step in treatment. Pain evaluated is that of treated area only. Do not include pain from an untreated area. Complete every hour, on the hour, for the initial 8  hours. Set an alarm to help you do this part accurately. Do not go to sleep and have it completed later. It will not be accurate. Follow-up appointment: Keep your follow-up appointment after the procedure. Usually 2-6 weeks after radiofrequency. Bring you pain diary. The information collected will be essential for your long-term care.   Expect: From numbing medicine (AKA: Local Anesthetics): Numbness or decrease in pain. Onset: Full effect within 15 minutes of injected. Duration: It will depend on the type of local anesthetic used. On the average, 1 to 8 hours.  From steroids (when added): Decrease in swelling or inflammation. Once inflammation is improved, relief of the pain will follow. Onset of benefits: Depends on the amount of swelling present. The more swelling, the longer it will take for the benefits to be seen. In some cases, up to 10 days. Duration: Steroids will stay in the system x 2 weeks. Duration of benefits will depend on multiple posibilities including persistent irritating factors. From procedure: Some discomfort is to be expected once the numbing medicine wears off. In the case of radiofrequency procedures, this may last as long as 6 weeks. Additional post-procedure pain medication is provided for this. Discomfort is minimized if ice and heat are applied as instructed.  Call if: You experience numbness and weakness that gets worse with time, as opposed to wearing off. He experience any unusual bleeding, difficulty breathing, or loss of the ability to control your bowel and bladder. (This applies to Spinal procedures only) You experience any redness, swelling, heat, red streaks, elevated temperature, fever, or any other signs of a possible infection.  Emergency Numbers: Durning business hours (Monday - Thursday, 8:00 AM - 4:00 PM) (Friday, 9:00 AM - 12:00 Noon): [phone] After hours: [phone] ____________________________________________________________________________________________  ___________________________________________________________________________________________  Post-Radiofrequency (RF) Discharge Instructions  You have just completed a Radiofrequency Neurotomy.  The following instructions will provide you with information and guidelines for self-care upon discharge.  If at any time you have questions or concerns please call your physician. DO NOT DRIVE YOURSELF!!  Instructions: Apply ice: Fill a plastic sandwich bag with crushed ice. Cover it with a small towel and apply to injection site. Apply for 15 minutes then remove x 15 minutes. Repeat sequence on day of procedure, until you go to bed. The purpose is to minimize swelling and discomfort after procedure. Apply heat: Apply heat to procedure site starting the day following the procedure. The purpose is to treat any soreness and discomfort from the procedure. Food intake: No eating limitations, unless stipulated above.  Nevertheless, if you have had sedation, you may experience some nausea.  In this case, it may be wise to wait at least two hours prior to resuming regular diet. Physical activities: Keep activities to a minimum for the first 8 hours after the procedure. For the first 24 hours after the procedure, do not drive a motor vehicle,  Operate heavy machinery, power tools, or handle any weapons.  Consider walking with the use of an assistive device or accompanied by an adult for the first 24 hours.  Do not drink alcoholic beverages including beer.  Do not make any important decisions or sign any legal documents. Go home and rest today.  Resume activities tomorrow, as   tolerated.  Use caution in moving about as you may experience mild leg weakness.  Use caution in cooking, use of household electrical appliances and climbing steps. Driving: If you have received any sedation, you are not allowed to drive for 24  hours after your procedure. Blood thinner: Restart your blood thinner 6 hours after your procedure. (Only for those taking blood thinners) Insulin: As soon as you can eat, you may resume your normal dosing schedule. (Only for those taking insulin) Medications: May resume pre-procedure medications.  Do not take any drugs, other than what has been prescribed to you. Infection prevention: Keep procedure site clean and dry. Post-procedure Pain Diary: Extremely important that this be done correctly and accurately. Recorded information will be used to determine the next step in treatment. Pain evaluated is that of treated area only. Do not include pain from an untreated area. Complete every hour, on the hour, for the initial 8 hours. Set an alarm to help you do this part accurately. Do not go to sleep and have it completed later. It will not be accurate. Follow-up appointment: Keep your follow-up appointment after the procedure. Usually 2-6 weeks after radiofrequency. Bring you pain diary. The information collected will be essential for your long-term care.   Expect: From numbing medicine (AKA: Local Anesthetics): Numbness or decrease in pain. Onset: Full effect within 15 minutes of injected. Duration: It will depend on the type of local anesthetic used. On the average, 1 to 8 hours.  From steroids (when added): Decrease in swelling or inflammation. Once inflammation is improved, relief of the pain will follow. Onset of benefits: Depends on the amount of swelling present. The more swelling, the longer it will take for the benefits to be seen. In some cases, up to 10 days. Duration: Steroids will stay in the system x 2 weeks. Duration of benefits will depend on multiple posibilities including persistent irritating factors. From procedure: Some discomfort is to be expected once the numbing medicine wears off. In the case of radiofrequency procedures, this may last as long as 6 weeks. Additional  post-procedure pain medication is provided for this. Discomfort is minimized if ice and heat are applied as instructed.  Call if: You experience numbness and weakness that gets worse with time, as opposed to wearing off. He experience any unusual bleeding, difficulty breathing, or loss of the ability to control your bowel and bladder. (This applies to Spinal procedures only) You experience any redness, swelling, heat, red streaks, elevated temperature, fever, or any other signs of a possible infection.  Emergency Numbers: Durning business hours (Monday - Thursday, 8:00 AM - 4:00 PM) (Friday, 9:00 AM - 12:00 Noon): [phone] After hours: [phone] ____________________________________________________________________________________________   

## 2021-02-19 NOTE — Progress Notes (Signed)
Patient's Name: [name]  MRN: [id number]  Referring Provider: Lauro Regulus, MD  DOB: [birth year]  PCP: Lauro Regulus, MD  DOS: 02/19/2021  Note by: Edward Jolly, MD  Service setting: Ambulatory outpatient  Specialty: Interventional Pain Management  Patient type: Established  Location: [facility] (AMB) Pain Management Facility  Visit type: Interventional Procedure   Primary Reason for Visit: Interventional Pain Management Treatment. CC: Back Pain (Left, lower)  Procedure:       Anesthesia, Analgesia, Anxiolysis:  Type: Thermal Lumbar Facet, Medial Branch Radiofrequency Ablation/Neurotomy #6 Level:L3, L4, L5,  Medial Branch Level(s). These levels will denervate the L3-4, and the L4-5 lumbar facet joints. Primary Purpose: Therapeutic Region: Posterolateral Lumbosacral Spine Laterality: Left  (#1 done 12/01/2017 left L3, L4, L5 RFA, #2 done 12/14/2018, #3 done 06/21/19, #4 12/04/2019, #5 08/14/20)   Type: Moderate (Conscious) Sedation combined with Local Anesthesia Indication(s): Analgesia and Anxiety Route: Intravenous (IV) IV Access: Secured Sedation: Meaningful verbal contact was maintained at all times during the procedure  Local Anesthetic: Lidocaine 1%   Indications: 1. Lumbar facet arthropathy   2. Lumbar spondylosis   3. Chronic pain syndrome     [name] has been dealing with the above chronic pain for longer than three months and has either failed to respond, was unable to tolerate, or simply did not get enough benefit from other more conservative therapies including, but not limited to: 1. Over-the-counter medications 2. Anti-inflammatory medications 3. Muscle relaxants 4. Membrane stabilizers 5. Opioids 6. Physical therapy 7. Modalities (Heat, ice, etc.) 8. Invasive techniques such as nerve blocks. [name] has attained more than 50% relief of the pain from a series of diagnostic injections conducted in separate  occasions.  Pain Score: Pre-procedure: 6 /10 Post-procedure: 0-No pain/10  Pre-op Assessment:  [name] is a 75 y.o. (year old), female patient, seen today for interventional treatment. She  has a past surgical history that includes Ankle reconstruction (Left); Brachioplasty; Cataract extraction; Cholecystectomy; Soft Tissue Tumor Resection; Abdominal hysterectomy (07/12/2014); Joint replacement (Bilateral, 07/2014, 03/2015); nasal reconstrucion; Toe Surgery (Right); Shoulder open rotator cuff repair; Colonoscopy with propofol (N/A, 12/07/2016); Reduction mammaplasty (Bilateral); and Breast reduction surgery (Bilateral, 2020). [name] has a current medication list which includes the following prescription(s): acetaminophen, albuterol, amantadine, amlodipine, aripiprazole, vitamin c, aspirin ec, azelastine, baclofen, biotin, carvedilol, celecoxib, vitamin d3, donepezil, esomeprazole, fluticasone, fluticasone furoate-vilanterol, folic acid, guaifenesin-dextromethorphan, levothyroxine, losartan, magnesium, melatonin, montelukast, multivitamin, nystatin, onabotulinumtoxina, potassium chloride, potassium chloride sa, rybelsus, tapentadol, [START ON 03/04/2021] tapentadol, topiramate, torsemide, trazodone, venlafaxine hcl, and zinc gluconate, and the following Facility-Administered Medications: dexamethasone, fentanyl, iohexol, lidocaine hcl (pf), and ropivacaine (pf) 2 mg/ml (0.2%). Her primarily concern today is the Back Pain (Left, lower)  Initial Vital Signs:  Pulse/HCG Rate: 81ECG Heart Rate: 83 Temp: 98.1 F (36.7 C) Resp: 16 BP: 138/66 SpO2: 99 %  BMI: Estimated body mass index is 46.32 kg/m as calculated from the following:   Height as of this encounter: 5\' 6"  (1.676 m).   Weight as of this encounter: 287 lb (130.2 kg).  Risk Assessment: Allergies: Reviewed. She is allergic to codeine, povidone iodine, hydromorphone, latex, oxycodone-acetaminophen, sulfa  antibiotics, bupropion, gabapentin, hydrocodone, morphine and related, and procaine.  Allergy Precautions: None required Coagulopathies: Reviewed. None identified.  Blood-thinner therapy: None at this time Active Infection(s): Reviewed. None identified. Ms. [name] is afebrile  Site Confirmation: [name] was asked to confirm the procedure and laterality before marking the site Procedure checklist:  Completed Consent: Before the procedure and under the influence of no sedative(s), amnesic(s), or anxiolytics, the patient was informed of the treatment options, risks and possible complications. To fulfill our ethical and legal obligations, as recommended by the American Medical Association's Code of Ethics, I have informed the patient of my clinical impression; the nature and purpose of the treatment or procedure; the risks, benefits, and possible complications of the intervention; the alternatives, including doing nothing; the risk(s) and benefit(s) of the alternative treatment(s) or procedure(s); and the risk(s) and benefit(s) of doing nothing. The patient was provided information about the general risks and possible complications associated with the procedure. These may include, but are not limited to: failure to achieve desired goals, infection, bleeding, organ or nerve damage, allergic reactions, paralysis, and death. In addition, the patient was informed of those risks and complications associated to Spine-related procedures, such as failure to decrease pain; infection (i.e.: Meningitis, epidural or intraspinal abscess); bleeding (i.e.: epidural hematoma, subarachnoid hemorrhage, or any other type of intraspinal or peri-dural bleeding); organ or nerve damage (i.e.: Any type of peripheral nerve, nerve root, or spinal cord injury) with subsequent damage to sensory, motor, and/or autonomic systems, resulting in permanent pain, numbness, and/or weakness of one or several areas  of the body; allergic reactions; (i.e.: anaphylactic reaction); and/or death. Furthermore, the patient was informed of those risks and complications associated with the medications. These include, but are not limited to: allergic reactions (i.e.: anaphylactic or anaphylactoid reaction(s)); adrenal axis suppression; blood sugar elevation that in diabetics may result in ketoacidosis or comma; water retention that in patients with history of congestive heart failure may result in shortness of breath, pulmonary edema, and decompensation with resultant heart failure; weight gain; swelling or edema; medication-induced neural toxicity; particulate matter embolism and blood vessel occlusion with resultant organ, and/or nervous system infarction; and/or aseptic necrosis of one or more joints. Finally, the patient was informed that Medicine is not an exact science; therefore, there is also the possibility of unforeseen or unpredictable risks and/or possible complications that may result in a catastrophic outcome. The patient indicated having understood very clearly. We have given the patient no guarantees and we have made no promises. Enough time was given to the patient to ask questions, all of which were answered to the patient's satisfaction. [name] has indicated that she wanted to continue with the procedure. Attestation: I, the ordering provider, attest that I have discussed with the patient the benefits, risks, side-effects, alternatives, likelihood of achieving goals, and potential problems during recovery for the procedure that I have provided informed consent. Date  Time: 02/19/2021  8:10 AM  Pre-Procedure Preparation:  Monitoring: As per clinic protocol. Respiration, ETCO2, SpO2, BP, heart rate and rhythm monitor placed and checked for adequate function Safety Precautions: Patient was assessed for positional comfort and pressure points before starting the procedure. Time-out: I initiated  and conducted the "Time-out" before starting the procedure, as per protocol. The patient was asked to participate by confirming the accuracy of the "Time Out" information. Verification of the correct person, site, and procedure were performed and confirmed by me, the nursing staff, and the patient. "Time-out" conducted as per Joint Commission's Universal Protocol (UP.01.01.01). Time: 0852  Description of Procedure:       Position: Prone Laterality: Left Levels:   L3, L4, L5,  Medial Branch Level(s), at the L3-4 and the L4-5 lumbar facet joints. Area Prepped: Lumbosacral Prepping solution: ChloraPrep (2% chlorhexidine gluconate and 70% isopropyl alcohol) Safety Precautions: Aspiration  looking for blood return was conducted prior to all injections. At no point did we inject any substances, as a needle was being advanced. Before injecting, the patient was told to immediately notify me if she was experiencing any new onset of "ringing in the ears, or metallic taste in the mouth". No attempts were made at seeking any paresthesias. Safe injection practices and needle disposal techniques used. Medications properly checked for expiration dates. SDV (single dose vial) medications used. After the completion of the procedure, all disposable equipment used was discarded in the proper designated medical waste containers. Local Anesthesia: Protocol guidelines were followed. The patient was positioned over the fluoroscopy table. The area was prepped in the usual manner. The time-out was completed. The target area was identified using fluoroscopy. A 12-in long, straight, sterile hemostat was used with fluoroscopic guidance to locate the targets for each level blocked. Once located, the skin was marked with an approved surgical skin marker. Once all sites were marked, the skin (epidermis, dermis, and hypodermis), as well as deeper tissues (fat, connective tissue and muscle) were infiltrated with a small amount of a  short-acting local anesthetic, loaded on a 10cc syringe with a 25G, 1.5-in  Needle. An appropriate amount of time was allowed for local anesthetics to take effect before proceeding to the next step. Local Anesthetic: Lidocaine 1.0% The unused portion of the local anesthetic was discarded in the proper designated containers. Technical explanation of process:  Radiofrequency Ablation (RFA)  L3 Medial Branch Nerve RFA: The target area for the L3 medial branch is at the junction of the postero-lateral aspect of the superior articular process and the superior, posterior, and medial edge of the transverse process of L4. Under fluoroscopic guidance, a Radiofrequency needle was inserted until contact was made with os over the superior postero-lateral aspect of the pedicular shadow (target area). Sensory and motor testing was conducted to properly adjust the position of the needle. Once satisfactory placement of the needle was achieved, the numbing solution was slowly injected after negative aspiration for blood. 1 mL of the nerve block solution was injected without difficulty or complication. After waiting for at least 3 minutes, the ablation was performed. Once completed, the needle was removed intact. L4 Medial Branch Nerve RFA: The target area for the L4 medial branch is at the junction of the postero-lateral aspect of the superior articular process and the superior, posterior, and medial edge of the transverse process of L5. Under fluoroscopic guidance, a Radiofrequency needle was inserted until contact was made with os over the superior postero-lateral aspect of the pedicular shadow (target area). Sensory and motor testing was conducted to properly adjust the position of the needle. Once satisfactory placement of the needle was achieved, the numbing solution was slowly injected after negative aspiration for blood. 61mL of the nerve block solution was injected without difficulty or complication. After waiting for  at least 3 minutes, the ablation was performed. Once completed, the needle was removed intact. L5 Medial Branch Nerve RFA: The target area for the L5 medial branch is at the junction of the postero-lateral aspect of the superior articular process of S1 and the superior, posterior, and medial edge of the sacral ala. Under fluoroscopic guidance, a Radiofrequency needle was inserted until contact was made with os over the superior postero-lateral aspect of the pedicular shadow (target area). Sensory and motor testing was conducted to properly adjust the position of the needle. Once satisfactory placement of the needle was achieved, the numbing solution was slowly  injected after negative aspiration for blood. 1mL of the nerve block solution was injected without difficulty or complication. After waiting for at least 3 minutes, the ablation was performed. Once completed, the needle was removed intact.  Radiofrequency lesioning (ablation):  Radiofrequency Generator: NeuroTherm [id number] Sensory Stimulation Parameters: 50 Hz was used to locate & identify the nerve, making sure that the needle was positioned such that there was no sensory stimulation below 0.3 V or above 0.7 V. Motor Stimulation Parameters: 2 Hz was used to evaluate the motor component. Care was taken not to lesion any nerves that demonstrated motor stimulation of the lower extremities at an output of less than 2.5 times that of the sensory threshold, or a maximum of 2.0 V. Lesioning Technique Parameters: Standard Radiofrequency settings. (Not bipolar or pulsed.) Temperature Settings: 80 degrees C Lesioning time: 60 seconds Intra-operative Compliance: Compliant Materials & Medications: Needle(s) (Electrode/Cannula) Type: Teflon-coated, curved tip, Radiofrequency needle(s) Gauge: 22G Length: 10cm Numbing solution: 5 cc solution of 4 cc of 0.2% Ropivacaine and 1 cc of Decadron 10mg /cc, 1-1.5 cc injected at each level above prior to ablation. The  unused portion of the solution was discarded in the proper designated containers.  Once the entire procedure was completed, the treated area was cleaned, making sure to leave some of the prepping solution back to take advantage of its long term bactericidal properties.  Illustration of the posterior view of the lumbar spine and the posterior neural structures. Laminae of L2 through S1 are labeled. DPRL5, dorsal primary ramus of L5; DPRS1, dorsal primary ramus of S1; DPR3, dorsal primary ramus of L3; FJ, facet (zygapophyseal) joint L3-L4; I, inferior articular process of L4; LB1, lateral branch of dorsal primary ramus of L1; IAB, inferior articular branches from L3 medial branch (supplies L4-L5 facet joint); IBP, intermediate branch plexus; MB3, medial branch of dorsal primary ramus of L3; NR3, third lumbar nerve root; S, superior articular process of L5; SAB, superior articular branches from L4 (supplies L4-5 facet joint also); TP3, transverse process of L3.  Vitals:   02/19/21 0912 02/19/21 0923 02/19/21 0933 02/19/21 0943  BP: (!) 139/91 (!) 151/87 (!) 143/65 (!) 158/86  Pulse:      Resp: 18 16 15 13   Temp:  (!) 97.4 F (36.3 C)  (!) 97.3 F (36.3 C)  TempSrc:  Temporal  Temporal  SpO2: 98% 99% 99% 98%  Weight:      Height:        Start Time: 0852 hrs. End Time: 0912 hrs.  Imaging Guidance (Spinal):  Type of Imaging Technique: Fluoroscopy Guidance (Spinal) Indication(s): Assistance in needle guidance and placement for procedures requiring needle placement in or near specific anatomical locations not easily accessible without such assistance. Exposure Time: Please see nurses notes. Contrast: None used. Fluoroscopic Guidance: I was personally present during the use of fluoroscopy. "Tunnel Vision Technique" used to obtain the best possible view of the target area. Parallax error corrected before commencing the procedure. "Direction-depth-direction" technique used to introduce the needle  under continuous pulsed fluoroscopy. Once target was reached, antero-posterior, oblique, and lateral fluoroscopic projection used confirm needle placement in all planes. Images permanently stored in EMR. Interpretation: No contrast injected. I personally interpreted the imaging intraoperatively. Adequate needle placement confirmed in multiple planes. Permanent images saved into the patient's record.   Post-operative Assessment:  Post-procedure Vital Signs:  Pulse/HCG Rate: 8183 Temp: (!) 97.3 F (36.3 C) Resp: 13 BP: (!) 158/86 SpO2: 98 %  EBL: None  Complications: No immediate post-treatment complications  observed by team, or reported by patient.  Note: The patient tolerated the entire procedure well. A repeat set of vitals were taken after the procedure and the patient was kept under observation following institutional policy, for this type of procedure. Post-procedural neurological assessment was performed, showing return to baseline, prior to discharge. The patient was provided with post-procedure discharge instructions, including a section on how to identify potential problems. Should any problems arise concerning this procedure, the patient was given instructions to immediately contact us, at any time, without hesitation. In any case, we plan to contact the patient by telephone for a follow-up status report regarding this interventional procedure.  Comments:  No additional relevant information. 5 out of 5 strength bilateral lower extremity: Plantar flexion, dorsiflexion, knee flexion, knee extension.  Plan of Care   Imaging Orders         DG PAIN CLINIC C-ARM 1-60 MIN NO REPORT    Procedure Orders         Radiofrequency,Lumbar       Medications ordered for procedure: Meds ordered this encounter  Medications   lidocaine HCl (PF) (XYLOCAINE) 2 % injection    Shatley, Jennifer   : cabinet override   ropivacaine (PF) 2 mg/mL (0.2%) (NAROPIN) 2 MG/ML injection    Shatley,  Jennifer   : cabinet override   dexamethasone (DECADRON) 10 MG/ML injection    Shatley, Jennifer   : cabinet override   fentaNYL (SUBLIMAZE) 100 MCG/2ML injection    Shatley, Jennifer   : cabinet override   iohexol (OMNIPAQUE) 180 MG/ML injection 10 mL    Must be Myelogram-compatible. If not available, you may substitute with a water-soluble, non-ionic, hypoallergenic, myelogram-compatible radiological contrast medium.   lidocaine (XYLOCAINE) 2 % (with pres) injection 400 mg   dexamethasone (DECADRON) injection 10 mg   ropivacaine (PF) 2 mg/mL (0.2%) (NAROPIN) injection 9 mL   fentaNYL (SUBLIMAZE) injection 25-50 mcg    Make sure Narcan is available in the pyxis when using this medication. In the event of respiratory depression (RR< 8/min): Titrate NARCAN (naloxone) in increments of 0.1 to 0.2 mg IV at 2-3 minute intervals, until desired degree of reversal.   Medications administered: We administered dexamethasone, fentaNYL, lidocaine, and ropivacaine (PF) 2 mg/mL (0.2%).  See the medical record for exact dosing, route, and time of administration.   Disposition: Discharge home  Discharge Date & Time: 02/19/2021; 0944 hrs.   Physician-requested Follow-up: Return in about 2 weeks (around 03/05/2021) for Right L3, 4, 5 RFA , moderate sedation (ECT suite).  Future Appointments  Date Time Provider Department Center  03/05/2021  8:00 AM Edward Jolly, MD [facility] None  04/01/2021 10:40 AM Edward Jolly, MD [facility] None   Primary Care Physician: Lauro Regulus, MD Location: [facility] Outpatient Pain Management Facility Note by: Edward Jolly, MD Date: 02/19/2021; Time: 10:33 AM  Disclaimer:  Medicine is not an exact science. The only guarantee in medicine is that nothing is guaranteed. It is important to note that the decision to proceed with this intervention was based on the information collected from the patient. The Data and conclusions were drawn from the patient's questionnaire, the  interview, and the physical examination. Because the information was provided in large part by the patient, it cannot be guaranteed that it has not been purposely or unconsciously manipulated. Every effort has been made to obtain as much relevant data as possible for this evaluation. It is important to note that the conclusions that lead to this procedure are derived  in large part from the available data. Always take into account that the treatment will also be dependent on availability of resources and existing treatment guidelines, considered by other Pain Management Practitioners as being common knowledge and practice, at the time of the intervention. For Medico-Legal purposes, it is also important to point out that variation in procedural techniques and pharmacological choices are the acceptable norm. The indications, contraindications, technique, and results of the above procedure should only be interpreted and judged by a Board-Certified Interventional Pain Specialist with extensive familiarity and expertise in the same exact procedure and technique.

## 2021-02-20 ENCOUNTER — Telehealth: Payer: Self-pay

## 2021-02-20 NOTE — Telephone Encounter (Signed)
Post procedure phone call.  Patient states she is doing well.  

## 2021-03-05 ENCOUNTER — Ambulatory Visit
Admission: RE | Admit: 2021-03-05 | Discharge: 2021-03-05 | Disposition: A | Discharge status: Home / Self Care | Payer: Medicare Other | Source: Ambulatory Visit | Attending: Student in an Organized Health Care Education/Training Program | Admitting: Student in an Organized Health Care Education/Training Program

## 2021-03-05 ENCOUNTER — Encounter: Payer: Self-pay | Admitting: Student in an Organized Health Care Education/Training Program

## 2021-03-05 ENCOUNTER — Ambulatory Visit (HOSPITAL_BASED_OUTPATIENT_CLINIC_OR_DEPARTMENT_OTHER): Payer: Medicare Other | Admitting: Student in an Organized Health Care Education/Training Program

## 2021-03-05 ENCOUNTER — Other Ambulatory Visit: Payer: Self-pay

## 2021-03-05 VITALS — BP 169/88 | HR 80 | Temp 97.1°F | Resp 14 | Ht 66.0 in | Wt 288.0 lb

## 2021-03-05 DIAGNOSIS — G894 Chronic pain syndrome: Secondary | ICD-10-CM | POA: Diagnosis present

## 2021-03-05 DIAGNOSIS — M47816 Spondylosis without myelopathy or radiculopathy, lumbar region: Secondary | ICD-10-CM | POA: Diagnosis present

## 2021-03-05 DIAGNOSIS — Z885 Allergy status to narcotic agent status: Secondary | ICD-10-CM | POA: Diagnosis not present

## 2021-03-05 DIAGNOSIS — Z9049 Acquired absence of other specified parts of digestive tract: Secondary | ICD-10-CM | POA: Diagnosis not present

## 2021-03-05 DIAGNOSIS — Z888 Allergy status to other drugs, medicaments and biological substances status: Secondary | ICD-10-CM | POA: Insufficient documentation

## 2021-03-05 DIAGNOSIS — Z882 Allergy status to sulfonamides status: Secondary | ICD-10-CM | POA: Insufficient documentation

## 2021-03-05 MED ORDER — LIDOCAINE HCL (PF) 2 % IJ SOLN
INTRAMUSCULAR | Status: AC
  Filled 2021-03-05: qty 20

## 2021-03-05 MED ORDER — LIDOCAINE HCL 2 % IJ SOLN
20.0000 mL | Freq: Once | INTRAMUSCULAR | Status: AC
  Administered 2021-03-05: 400 mg
  Filled 2021-03-05: qty 20

## 2021-03-05 MED ORDER — DEXAMETHASONE SODIUM PHOSPHATE 10 MG/ML IJ SOLN
INTRAMUSCULAR | Status: AC
  Filled 2021-03-05: qty 1

## 2021-03-05 MED ORDER — DIAZEPAM 5 MG PO TABS
ORAL_TABLET | ORAL | Status: AC
  Filled 2021-03-05: qty 1

## 2021-03-05 MED ORDER — ROPIVACAINE HCL 2 MG/ML IJ SOLN
9.0000 mL | Freq: Once | INTRAMUSCULAR | Status: AC
  Administered 2021-03-05: 9 mL via PERINEURAL

## 2021-03-05 MED ORDER — ROPIVACAINE HCL 2 MG/ML IJ SOLN
INTRAMUSCULAR | Status: AC
  Filled 2021-03-05: qty 20

## 2021-03-05 MED ORDER — DEXAMETHASONE SODIUM PHOSPHATE 10 MG/ML IJ SOLN
10.0000 mg | Freq: Once | INTRAMUSCULAR | Status: AC
  Administered 2021-03-05: 10 mg

## 2021-03-05 MED ORDER — DIAZEPAM 5 MG PO TABS
5.0000 mg | ORAL_TABLET | ORAL | Status: AC
  Administered 2021-03-05: 5 mg via ORAL

## 2021-03-05 NOTE — Patient Instructions (Signed)

## 2021-03-05 NOTE — Progress Notes (Signed)
Safety precautions to be maintained throughout the outpatient stay will include: orient to surroundings, keep bed in low position, maintain call bell within reach at all times, provide assistance with transfer out of bed and ambulation.  

## 2021-03-05 NOTE — Progress Notes (Signed)
Patient's Name: [name]  MRN: [id number]  Referring Provider: Lauro Regulus, MD  DOB: 28-Apr-1946  PCP: Lauro Regulus, MD  DOS: 03/05/2021  Note by: Edward Jolly, MD  Service setting: Ambulatory outpatient  Specialty: Interventional Pain Management  Patient type: Established  Location: [facility] (AMB) Pain Management Facility  Visit type: Interventional Procedure   Primary Reason for Visit: Interventional Pain Management Treatment. CC: Back Pain (lower)  Procedure:       Anesthesia, Analgesia, Anxiolysis:  Type: Thermal Lumbar Facet, Medial Branch Radiofrequency Ablation/Neurotomy #7 Level: L3, L4, L5,  Medial Branch Level(s). These levels will denervate the L3-4, and the L4-5 lumbar facet joints. Primary Purpose: Therapeutic Region: Posterolateral Lumbosacral Spine Laterality: Right  Previously done 09/30/20  Type: Local Anesthesia with PO Valium 5 mg Indication(s): Analgesia and Anxiety Local Anesthetic: Lidocaine 1%   Indications: 1. Lumbar facet arthropathy   2. Chronic pain syndrome     [name] has been dealing with the above chronic pain for longer than three months and has either failed to respond, was unable to tolerate, or simply did not get enough benefit from other more conservative therapies including, but not limited to: 1. Over-the-counter medications 2. Anti-inflammatory medications 3. Muscle relaxants 4. Membrane stabilizers 5. Opioids 6. Physical therapy 7. Modalities (Heat, ice, etc.) 8. Invasive techniques such as nerve blocks. [name] has attained more than 50% relief of the pain from a series of diagnostic injections conducted in separate occasions.  Pain Score: Pre-procedure: 4 /10 Post-procedure: 0-No pain/10  Pre-op Assessment:  [name] is a 75 y.o. (year old), female patient, seen today for interventional treatment. She  has a past surgical history that includes Ankle  reconstruction (Left); Brachioplasty; Cataract extraction; Cholecystectomy; Soft Tissue Tumor Resection; Abdominal hysterectomy (07/12/2014); Joint replacement (Bilateral, 07/2014, 03/2015); nasal reconstrucion; Toe Surgery (Right); Shoulder open rotator cuff repair; Colonoscopy with propofol (N/A, 12/07/2016); Reduction mammaplasty (Bilateral); and Breast reduction surgery (Bilateral, 2020). [name] has a current medication list which includes the following prescription(s): acetaminophen, albuterol, amantadine, amlodipine, aripiprazole, vitamin c, aspirin ec, azelastine, baclofen, biotin, carvedilol, celecoxib, vitamin d3, donepezil, esomeprazole, fluticasone, fluticasone furoate-vilanterol, folic acid, guaifenesin-dextromethorphan, levothyroxine, losartan, magnesium, melatonin, montelukast, multivitamin, nystatin, onabotulinumtoxina, potassium chloride, potassium chloride sa, rybelsus, tapentadol, topiramate, torsemide, trazodone, venlafaxine hcl, and zinc gluconate. Her primarily concern today is the Back Pain (lower)  Initial Vital Signs:  Pulse/HCG Rate: 80ECG Heart Rate: 89 Temp: (!) 97.1 F (36.2 C) Resp: 16 BP: (!) 157/79 SpO2: 100 %  BMI: Estimated body mass index is 46.48 kg/m as calculated from the following:   Height as of this encounter: 5\' 6"  (1.676 m).   Weight as of this encounter: 288 lb (130.6 kg).  Risk Assessment: Allergies: Reviewed. She is allergic to codeine, povidone iodine, hydromorphone, latex, oxycodone-acetaminophen, sulfa antibiotics, bupropion, gabapentin, hydrocodone, morphine and related, and procaine.  Allergy Precautions: None required Coagulopathies: Reviewed. None identified.  Blood-thinner therapy: None at this time Active Infection(s): Reviewed. None identified. [name] is afebrile  Site Confirmation: [name] was asked to confirm the procedure and laterality before marking the site Procedure checklist:  Completed Consent: Before the procedure and under the influence of no sedative(s), amnesic(s), or anxiolytics, the patient was informed of the treatment options, risks and possible complications. To fulfill our ethical and legal obligations, as recommended by the American Medical Association's Code of Ethics, I have informed the patient of my clinical impression; the nature and purpose of the treatment  or procedure; the risks, benefits, and possible complications of the intervention; the alternatives, including doing nothing; the risk(s) and benefit(s) of the alternative treatment(s) or procedure(s); and the risk(s) and benefit(s) of doing nothing. The patient was provided information about the general risks and possible complications associated with the procedure. These may include, but are not limited to: failure to achieve desired goals, infection, bleeding, organ or nerve damage, allergic reactions, paralysis, and death. In addition, the patient was informed of those risks and complications associated to Spine-related procedures, such as failure to decrease pain; infection (i.e.: Meningitis, epidural or intraspinal abscess); bleeding (i.e.: epidural hematoma, subarachnoid hemorrhage, or any other type of intraspinal or peri-dural bleeding); organ or nerve damage (i.e.: Any type of peripheral nerve, nerve root, or spinal cord injury) with subsequent damage to sensory, motor, and/or autonomic systems, resulting in permanent pain, numbness, and/or weakness of one or several areas of the body; allergic reactions; (i.e.: anaphylactic reaction); and/or death. Furthermore, the patient was informed of those risks and complications associated with the medications. These include, but are not limited to: allergic reactions (i.e.: anaphylactic or anaphylactoid reaction(s)); adrenal axis suppression; blood sugar elevation that in diabetics may result in ketoacidosis or comma; water retention that in patients with history  of congestive heart failure may result in shortness of breath, pulmonary edema, and decompensation with resultant heart failure; weight gain; swelling or edema; medication-induced neural toxicity; particulate matter embolism and blood vessel occlusion with resultant organ, and/or nervous system infarction; and/or aseptic necrosis of one or more joints. Finally, the patient was informed that Medicine is not an exact science; therefore, there is also the possibility of unforeseen or unpredictable risks and/or possible complications that may result in a catastrophic outcome. The patient indicated having understood very clearly. We have given the patient no guarantees and we have made no promises. Enough time was given to the patient to ask questions, all of which were answered to the patient's satisfaction. [name] has indicated that she wanted to continue with the procedure. Attestation: I, the ordering provider, attest that I have discussed with the patient the benefits, risks, side-effects, alternatives, likelihood of achieving goals, and potential problems during recovery for the procedure that I have provided informed consent. Date  Time: 03/05/2021  8:12 AM  Pre-Procedure Preparation:  Monitoring: As per clinic protocol. Respiration, ETCO2, SpO2, BP, heart rate and rhythm monitor placed and checked for adequate function Safety Precautions: Patient was assessed for positional comfort and pressure points before starting the procedure. Time-out: I initiated and conducted the "Time-out" before starting the procedure, as per protocol. The patient was asked to participate by confirming the accuracy of the "Time Out" information. Verification of the correct person, site, and procedure were performed and confirmed by me, the nursing staff, and the patient. "Time-out" conducted as per Joint Commission's Universal Protocol (UP.01.01.01). Time: 0842  Description of Procedure:       Position:  Prone Laterality: Right Levels:   L3, L4, L5,  Medial Branch Level(s), at the L3-4 and the L4-5 lumbar facet joints. Area Prepped: Lumbosacral Prepping solution: ChloraPrep (2% chlorhexidine gluconate and 70% isopropyl alcohol) Safety Precautions: Aspiration looking for blood return was conducted prior to all injections. At no point did we inject any substances, as a needle was being advanced. Before injecting, the patient was told to immediately notify me if she was experiencing any new onset of "ringing in the ears, or metallic taste in the mouth". No attempts were made at seeking any paresthesias.  Safe injection practices and needle disposal techniques used. Medications properly checked for expiration dates. SDV (single dose vial) medications used. After the completion of the procedure, all disposable equipment used was discarded in the proper designated medical waste containers. Local Anesthesia: Protocol guidelines were followed. The patient was positioned over the fluoroscopy table. The area was prepped in the usual manner. The time-out was completed. The target area was identified using fluoroscopy. A 12-in long, straight, sterile hemostat was used with fluoroscopic guidance to locate the targets for each level blocked. Once located, the skin was marked with an approved surgical skin marker. Once all sites were marked, the skin (epidermis, dermis, and hypodermis), as well as deeper tissues (fat, connective tissue and muscle) were infiltrated with a small amount of a short-acting local anesthetic, loaded on a 10cc syringe with a 25G, 1.5-in  Needle. An appropriate amount of time was allowed for local anesthetics to take effect before proceeding to the next step. Local Anesthetic: Lidocaine 1.0% The unused portion of the local anesthetic was discarded in the proper designated containers. Technical explanation of process:  Radiofrequency Ablation (RFA)  L3 Medial Branch Nerve RFA: The target area for  the L3 medial branch is at the junction of the postero-lateral aspect of the superior articular process and the superior, posterior, and medial edge of the transverse process of L4. Under fluoroscopic guidance, a Radiofrequency needle was inserted until contact was made with os over the superior postero-lateral aspect of the pedicular shadow (target area). Sensory and motor testing was conducted to properly adjust the position of the needle. Once satisfactory placement of the needle was achieved, the numbing solution was slowly injected after negative aspiration for blood. 1 mL of the nerve block solution was injected without difficulty or complication. After waiting for at least 3 minutes, the ablation was performed. Once completed, the needle was removed intact. L4 Medial Branch Nerve RFA: The target area for the L4 medial branch is at the junction of the postero-lateral aspect of the superior articular process and the superior, posterior, and medial edge of the transverse process of L5. Under fluoroscopic guidance, a Radiofrequency needle was inserted until contact was made with os over the superior postero-lateral aspect of the pedicular shadow (target area). Sensory and motor testing was conducted to properly adjust the position of the needle. Once satisfactory placement of the needle was achieved, the numbing solution was slowly injected after negative aspiration for blood. 40mL of the nerve block solution was injected without difficulty or complication. After waiting for at least 3 minutes, the ablation was performed. Once completed, the needle was removed intact. L5 Medial Branch Nerve RFA: The target area for the L5 medial branch is at the junction of the postero-lateral aspect of the superior articular process of S1 and the superior, posterior, and medial edge of the sacral ala. Under fluoroscopic guidance, a Radiofrequency needle was inserted until contact was made with os over the superior  postero-lateral aspect of the pedicular shadow (target area). Sensory and motor testing was conducted to properly adjust the position of the needle. Once satisfactory placement of the needle was achieved, the numbing solution was slowly injected after negative aspiration for blood. 40mL of the nerve block solution was injected without difficulty or complication. After waiting for at least 3 minutes, the ablation was performed. Once completed, the needle was removed intact.  Radiofrequency lesioning (ablation):  Radiofrequency Generator: NeuroTherm [id number] Sensory Stimulation Parameters: 50 Hz was used to locate & identify the nerve, making sure  that the needle was positioned such that there was no sensory stimulation below 0.3 V or above 0.7 V. Motor Stimulation Parameters: 2 Hz was used to evaluate the motor component. Care was taken not to lesion any nerves that demonstrated motor stimulation of the lower extremities at an output of less than 2.5 times that of the sensory threshold, or a maximum of 2.0 V. Lesioning Technique Parameters: Standard Radiofrequency settings. (Not bipolar or pulsed.) Temperature Settings: 80 degrees C Lesioning time: 60 seconds Intra-operative Compliance: Compliant Materials & Medications: Needle(s) (Electrode/Cannula) Type: Teflon-coated, curved tip, Radiofrequency needle(s) Gauge: 22G Length: 10cm Numbing solution: 5 cc solution of 4 cc of 0.2% Ropivacaine and 1 cc of Decadron 10mg /cc, 1-1.5 cc injected at each level above prior to ablation. The unused portion of the solution was discarded in the proper designated containers.  Once the entire procedure was completed, the treated area was cleaned, making sure to leave some of the prepping solution back to take advantage of its long term bactericidal properties.  Illustration of the posterior view of the lumbar spine and the posterior neural structures. Laminae of L2 through S1 are labeled. DPRL5, dorsal primary ramus of  L5; DPRS1, dorsal primary ramus of S1; DPR3, dorsal primary ramus of L3; FJ, facet (zygapophyseal) joint L3-L4; I, inferior articular process of L4; LB1, lateral branch of dorsal primary ramus of L1; IAB, inferior articular branches from L3 medial branch (supplies L4-L5 facet joint); IBP, intermediate branch plexus; MB3, medial branch of dorsal primary ramus of L3; NR3, third lumbar nerve root; S, superior articular process of L5; SAB, superior articular branches from L4 (supplies L4-5 facet joint also); TP3, transverse process of L3.  Vitals:   03/05/21 0846 03/05/21 0850 03/05/21 0856 03/05/21 0901  BP: (!) 155/88 (!) 163/87 (!) 165/90 (!) 169/88  Pulse:      Resp: 13 15 17 14   Temp:      TempSrc:      SpO2: 95% 95% 97% 94%  Weight:      Height:        Start Time: 0842 hrs. End Time: 0901 hrs.  Imaging Guidance (Spinal):  Type of Imaging Technique: Fluoroscopy Guidance (Spinal) Indication(s): Assistance in needle guidance and placement for procedures requiring needle placement in or near specific anatomical locations not easily accessible without such assistance. Exposure Time: Please see nurses notes. Contrast: None used. Fluoroscopic Guidance: I was personally present during the use of fluoroscopy. "Tunnel Vision Technique" used to obtain the best possible view of the target area. Parallax error corrected before commencing the procedure. "Direction-depth-direction" technique used to introduce the needle under continuous pulsed fluoroscopy. Once target was reached, antero-posterior, oblique, and lateral fluoroscopic projection used confirm needle placement in all planes. Images permanently stored in EMR. Interpretation: No contrast injected. I personally interpreted the imaging intraoperatively. Adequate needle placement confirmed in multiple planes. Permanent images saved into the patient's record.   Post-operative Assessment:  Post-procedure Vital Signs:  Pulse/HCG Rate: 8079 Temp:  (!) 97.1 F (36.2 C) Resp: 14 BP: (!) 169/88 SpO2: 94 %  EBL: None  Complications: No immediate post-treatment complications observed by team, or reported by patient.  Note: The patient tolerated the entire procedure well. A repeat set of vitals were taken after the procedure and the patient was kept under observation following institutional policy, for this type of procedure. Post-procedural neurological assessment was performed, showing return to baseline, prior to discharge. The patient was provided with post-procedure discharge instructions, including a section on how to identify  potential problems. Should any problems arise concerning this procedure, the patient was given instructions to immediately contact us, at any time, without hesitation. In any case, we plan to contact the patient by telephone for a follow-up status report regarding this interventional procedure.  Comments:  No additional relevant information. 5 out of 5 strength bilateral lower extremity: Plantar flexion, dorsiflexion, knee flexion, knee extension.  Plan of Care   Imaging Orders         DG PAIN [facility] C-ARM 1-60 MIN NO REPORT    Procedure Orders    No procedure(s) ordered today      Medications ordered for procedure: Meds ordered this encounter  Medications   lidocaine (XYLOCAINE) 2 % (with pres) injection 400 mg   dexamethasone (DECADRON) injection 10 mg   ropivacaine (PF) 2 mg/mL (0.2%) (NAROPIN) injection 9 mL   diazepam (VALIUM) tablet 5 mg    Make sure Flumazenil is available in the pyxis when using this medication. If oversedation occurs, administer 0.2 mg IV over 15 sec. If after 45 sec no response, administer 0.2 mg again over 1 min; may repeat at 1 min intervals; not to exceed 4 doses (1 mg)   Medications administered: We administered lidocaine, dexamethasone, ropivacaine (PF) 2 mg/mL (0.2%), and diazepam.  See the medical record for exact dosing, route, and time of  administration.   Disposition: Discharge home  Discharge Date & Time: 03/05/2021; 0911 hrs.   Physician-requested Follow-up: Return for Keep sch. appt.  Future Appointments  Date Time Provider Department Center  04/01/2021 10:40 AM Edward Jolly, MD [facility] None   Primary Care Physician: Lauro Regulus, MD Location: [facility] Note by: Edward Jolly, MD Date: 03/05/2021; Time: 10:36 AM  Disclaimer:  Medicine is not an exact science. The only guarantee in medicine is that nothing is guaranteed. It is important to note that the decision to proceed with this intervention was based on the information collected from the patient. The Data and conclusions were drawn from the patient's questionnaire, the interview, and the physical examination. Because the information was provided in large part by the patient, it cannot be guaranteed that it has not been purposely or unconsciously manipulated. Every effort has been made to obtain as much relevant data as possible for this evaluation. It is important to note that the conclusions that lead to this procedure are derived in large part from the available data. Always take into account that the treatment will also be dependent on availability of resources and existing treatment guidelines, considered by other Pain Management Practitioners as being common knowledge and practice, at the time of the intervention. For Medico-Legal purposes, it is also important to point out that variation in procedural techniques and pharmacological choices are the acceptable norm. The indications, contraindications, technique, and results of the above procedure should only be interpreted and judged by a Board-Certified Interventional Pain Specialist with extensive familiarity and expertise in the same exact procedure and technique.

## 2021-03-06 ENCOUNTER — Telehealth: Payer: Self-pay | Admitting: Student in an Organized Health Care Education/Training Program

## 2021-03-06 ENCOUNTER — Telehealth: Payer: Self-pay

## 2021-03-06 NOTE — Telephone Encounter (Signed)
Post procedure phone call.  LM 

## 2021-04-01 ENCOUNTER — Encounter: Payer: Self-pay | Admitting: Student in an Organized Health Care Education/Training Program

## 2021-04-01 ENCOUNTER — Other Ambulatory Visit: Payer: Self-pay

## 2021-04-01 ENCOUNTER — Ambulatory Visit
Payer: Medicare Other | Attending: Student in an Organized Health Care Education/Training Program | Admitting: Student in an Organized Health Care Education/Training Program

## 2021-04-01 VITALS — BP 189/98 | HR 92 | Temp 96.9°F | Resp 16 | Ht 66.0 in | Wt 287.0 lb

## 2021-04-01 DIAGNOSIS — M62830 Muscle spasm of back: Secondary | ICD-10-CM | POA: Insufficient documentation

## 2021-04-01 DIAGNOSIS — M47816 Spondylosis without myelopathy or radiculopathy, lumbar region: Secondary | ICD-10-CM | POA: Diagnosis present

## 2021-04-01 DIAGNOSIS — G894 Chronic pain syndrome: Secondary | ICD-10-CM | POA: Insufficient documentation

## 2021-04-01 DIAGNOSIS — M5136 Other intervertebral disc degeneration, lumbar region: Secondary | ICD-10-CM | POA: Insufficient documentation

## 2021-04-01 MED ORDER — TAPENTADOL HCL 50 MG PO TABS: 50.0000 mg | ORAL_TABLET | Freq: Every day | ORAL | 0 refills | Status: AC | PRN

## 2021-04-01 MED ORDER — TAPENTADOL HCL 50 MG PO TABS: 50.0000 mg | ORAL_TABLET | Freq: Every day | ORAL | 0 refills | Status: DC | PRN

## 2021-04-01 NOTE — Progress Notes (Signed)
Nursing Pain Medication Assessment:  Safety precautions to be maintained throughout the outpatient stay will include: orient to surroundings, keep bed in low position, maintain call bell within reach at all times, provide assistance with transfer out of bed and ambulation.  Medication Inspection Compliance: Pill count conducted under aseptic conditions, in front of the patient. Neither the pills nor the bottle was removed from the patient's sight at any time. Once count was completed pills were immediately returned to the patient in their original bottle.  Medication: Tapentadol (Nucynta) Pill/Patch Count:  5 of 30 patches remain Pill/Patch Appearance: Markings consistent with prescribed medication Bottle Appearance: Standard pharmacy container. Clearly labeled. Filled Date: 09 / 19 / 2022 Last Medication intake:  Today

## 2021-04-01 NOTE — Progress Notes (Signed)
PROVIDER NOTE: Information contained herein reflects review and annotations entered in association with encounter. Interpretation of such information and data should be left to medically-trained personnel. Information provided to patient can be located elsewhere in the medical record under "Patient Instructions". Document created using STT-dictation technology, any transcriptional errors that may result from process are unintentional.    Patient: [name]  Service Category: E/M  Provider: Gillis Santa, MD  DOB: [birth year]  DOS: 04/01/2021  Specialty: Interventional Pain Management  MRN: [id number]  Setting: Ambulatory outpatient  PCP: Kirk Ruths, MD  Type: Established Patient    Referring Provider: Kirk Ruths, MD  Location: Office  Delivery: Face-to-face     HPI  [name], a 75 y.o. year old female, is here today because of her Lumbar facet arthropathy [M47.816]. [name]'s primary complain today is Back Pain (low) Last encounter: My last encounter with her was on 03/06/2021. Pertinent problems: [name] has Lumbar spondylosis; Lumbar facet arthropathy; Lumbar degenerative disc disease; Spasm of lumbar paraspinous muscle; and Chronic pain syndrome on their pertinent problem list. Pain Assessment: Severity of Chronic pain is reported as a 0-No pain/10. Location: Back Lower/hips and legs, worse on the left. Onset: More than a month ago. Quality:  (denies any pain today). Timing: Intermittent. Modifying factor(s): procedures, medications. Vitals:  height is 5' 6" (1.676 m) and weight is 287 lb (130.2 kg). Her temporal temperature is 96.9 F (36.1 C) (abnormal). Her blood pressure is 189/98 (abnormal) and her pulse is 92. Her respiration is 16 and oxygen saturation is 99%.   Reason for encounter: both, medication management and post-procedure assessment.     Post-Procedure Evaluation  Procedure  (03/05/2021):  Right L3, L4, L5 RFA 03/05/2021, left L3, L4, L5 RFA 02/19/2021  Anxiolysis: Please see nurses note.  Effectiveness during initial hour after procedure (Ultra-Short Term Relief): 100 %   Local anesthetic used: Long-acting (4-6 hours) Effectiveness: Defined as any analgesic benefit obtained secondary to the administration of local anesthetics. This carries significant diagnostic value as to the etiological location, or anatomical origin, of the pain. Duration of benefit is expected to coincide with the duration of the local anesthetic used.  Effectiveness during initial 4-6 hours after procedure (Short-Term Relief): 100 %  Long-term benefit: Defined as any relief past the pharmacologic duration of the local anesthetics.  Effectiveness past the initial 6 hours after procedure (Long-Term Relief): 100 %   Benefits, current: Defined as benefit present at the time of this evaluation.   Analgesia:  100% Function: [name] reports improvement in function ROM: [name] reports improvement in ROM   Pharmacotherapy Assessment  Analgesic: Nucynta 50 mg daily, quantity 30/month.     Monitoring: [place] PMP: PDMP reviewed during this encounter.       Pharmacotherapy: No side-effects or adverse reactions reported. Compliance: No problems identified. Effectiveness: Clinically acceptable.  Hart Rochester, RN  04/01/2021 10:50 AM  Signed Nursing Pain Medication Assessment:  Safety precautions to be maintained throughout the outpatient stay will include: orient to surroundings, keep bed in low position, maintain call bell within reach at all times, provide assistance with transfer out of bed and ambulation.  Medication Inspection Compliance: Pill count conducted under aseptic conditions, in front of the patient. Neither the pills nor the bottle was removed from the patient's sight at any time. Once count was completed pills were immediately returned to the  patient in their original bottle.  Medication: Tapentadol (  Nucynta) Pill/Patch Count:  5 of 30 patches remain Pill/Patch Appearance: Markings consistent with prescribed medication Bottle Appearance: Standard pharmacy container. Clearly labeled. Filled Date: 09 / 19 / 2022 Last Medication intake:  Today   UDS:  Summary  Date Value Ref Range Status  08/01/2020 Note  Final    Comment:    ==================================================================== [employer] Select 13 (MW) ==================================================================== Test                             Result       Flag       Units  Drug Present and Declared for Prescription Verification   Tapentadol                     5398         EXPECTED   ng/mg creat    Source of tapentadol is a scheduled prescription medication.  ==================================================================== Test                      Result    Flag   Units      Ref Range   Creatinine              158              mg/dL      >=20 ==================================================================== Declared Medications:  The flagging and interpretation on this report are based on the  following declared medications.  Unexpected results may arise from  inaccuracies in the declared medications.   **Note: The testing scope of this panel includes these medications:   Tapentadol (Nucynta)   **Note: The testing scope of this panel does not include the  following reported medications:   Acetaminophen (Tylenol)  Albuterol  Amantadine  Amlodipine (Norvasc)  Aripiprazole (Abilify)  Aspirin  Azelastine (Astelin)  Baclofen (Lioresal)  Biotin  Carvedilol (Coreg)  Celecoxib (Celebrex)  Donepezil (Aricept)  Esomeprazole (Nexium)  Fluticasone (Breo)  Fluticasone (Flonase)  Folic Acid  Guaifenesin (Robitussin)  Levothyroxine (Synthroid)  Losartan (Cozaar)  Magnesium  Melatonin  Montelukast (Singulair)  Multivitamin   Nystatin (Mycostatin)  Potassium (Klor-Con)  Topiramate (Topamax)  Torsemide (Demadex)  Trazodone (Desyrel)  Undefined Miscellaneous Drug  Venlafaxine  Vilanterol (Breo)  Vitamin C  Vitamin D3  Zinc ==================================================================== For clinical consultation, please call [phone]. ====================================================================      ROS  Constitutional: Denies any fever or chills Gastrointestinal: No reported hemesis, hematochezia, vomiting, or acute GI distress Musculoskeletal:  improved lumbar spine pain Neurological: No reported episodes of acute onset apraxia, aphasia, dysarthria, agnosia, amnesia, paralysis, loss of coordination, or loss of consciousness  Medication Review  ARIPiprazole, Biotin, OnabotulinumtoxinA, Semaglutide, Venlafaxine HCl, Vitamin D3, acetaminophen, albuterol, amLODipine, amantadine, aspirin EC, azelastine, baclofen, carvedilol, celecoxib, donepezil, esomeprazole, fluticasone, fluticasone furoate-vilanterol, folic acid, guaiFENesin-dextromethorphan, levothyroxine, losartan, magnesium, melatonin, montelukast, multivitamin, nystatin, potassium chloride, potassium chloride SA, tapentadol, topiramate, torsemide, traZODone, vitamin C, and zinc gluconate  History Review  Allergy: [name] is allergic to codeine, povidone iodine, hydromorphone, latex, oxycodone-acetaminophen, sulfa antibiotics, bupropion, gabapentin, hydrocodone, morphine and related, and procaine. Drug: [name]  reports no history of drug use. Alcohol:  reports current alcohol use of about 2.0 standard drinks per week. Tobacco:  reports that she has never smoked. She has never used smokeless tobacco. Social: [name]  reports that she has never smoked. She has never used smokeless tobacco. She reports current alcohol use of about 2.0 standard  drinks per week. She reports that she does  not use drugs. Medical:  has a past medical history of Benign positional vertigo (03/04/2016), Brain injury, Climacteric, Depression, DJD (degenerative joint disease), Dyslipidemia, GERD (gastroesophageal reflux disease), Hypertension, Hypokalemia, Hypothyroidism, IBS (irritable bowel syndrome), OAB (overactive bladder), Ovarian cyst (03/25/2015), Rhinitis, Sleep apnea, and Spasm of thoracolumbar muscle. Surgical: [name]  has a past surgical history that includes Ankle reconstruction (Left); Brachioplasty; Cataract extraction; Cholecystectomy; Soft Tissue Tumor Resection; Abdominal hysterectomy (07/12/2014); Joint replacement (Bilateral, 07/2014, 03/2015); nasal reconstrucion; Toe Surgery (Right); Shoulder open rotator cuff repair; Colonoscopy with propofol (N/A, 12/07/2016); Reduction mammaplasty (Bilateral); and Breast reduction surgery (Bilateral, 2020). Family: family history includes Breast cancer in her paternal aunt; Lung cancer in her father and mother.  Laboratory Chemistry Profile   Renal Lab Results  Component Value Date   BUN 25 (H) 07/06/2020   CREATININE 0.99 07/06/2020   GFRNONAA 60 (L) 07/06/2020    Hepatic Lab Results  Component Value Date   AST 25 07/02/2020   ALT 21 07/02/2020   ALBUMIN 3.7 07/02/2020   ALKPHOS 80 07/02/2020    Electrolytes Lab Results  Component Value Date   NA 136 07/06/2020   K 4.1 07/06/2020   CL 98 07/06/2020   CALCIUM 9.0 07/06/2020   MG 2.0 07/06/2020   PHOS 3.0 07/06/2020    Bone No results found for: VD25OH, SR159YV8PFY, TW4462MM3, OT7711AF7, 25OHVITD1, 25OHVITD2, 25OHVITD3, TESTOFREE, TESTOSTERONE  Inflammation (CRP: Acute Phase) (ESR: Chronic Phase) Lab Results  Component Value Date   LATICACIDVEN 1.1 07/02/2020         Note: Above Lab results reviewed.   Physical Exam  General appearance: Well nourished, well developed, and well hydrated. In no apparent acute distress Mental status: Alert, oriented x 3  (person, place, & time)       Respiratory: No evidence of acute respiratory distress Eyes: PERLA Vitals: BP (!) 189/98 (BP Location: Left Arm, Patient Position: Sitting, Cuff Size: Large)   Pulse 92   Temp (!) 96.9 F (36.1 C) (Temporal)   Resp 16   Ht 5' 6" (1.676 m)   Wt 287 lb (130.2 kg)   SpO2 99%   BMI 46.32 kg/m  BMI: Estimated body mass index is 46.32 kg/m as calculated from the following:   Height as of this encounter: 5' 6" (1.676 m).   Weight as of this encounter: 287 lb (130.2 kg). Ideal: Ideal body weight: 59.3 kg (130 lb 11.7 oz) Adjusted ideal body weight: 87.7 kg (193 lb 3.8 oz)  Improved lumbar facet pain, improved ROM  Assessment   Status Diagnosis  Controlled Controlled Controlled 1. Lumbar facet arthropathy   2. Lumbar spondylosis   3. Lumbar degenerative disc disease   4. Spasm of lumbar paraspinous muscle   5. Chronic pain syndrome      Plan of Care    Ms. Jeanette J Hyppolite has a current medication list which includes the following long-term medication(s): albuterol, amantadine, amlodipine, aripiprazole, azelastine, carvedilol, donepezil, esomeprazole, levothyroxine, losartan, montelukast, potassium chloride, potassium chloride sa, torsemide, trazodone, and venlafaxine hcl.  Pharmacotherapy (Medications Ordered): Meds ordered this encounter  Medications   tapentadol (NUCYNTA) 50 MG tablet    Sig: Take 1 tablet (50 mg total) by mouth daily as needed for moderate pain or severe pain.    Dispense:  30 tablet    Refill:  0    Chronic Pain: STOP Act (Not applicable) Fill 1 day early if closed on refill date. Avoid benzodiazepines  within 8 hours of opioids   tapentadol (NUCYNTA) 50 MG tablet    Sig: Take 1 tablet (50 mg total) by mouth daily as needed for moderate pain or severe pain.    Dispense:  30 tablet    Refill:  0    Chronic Pain: STOP Act (Not applicable) Fill 1 day early if closed on refill date. Avoid benzodiazepines within 8  hours of opioids   tapentadol (NUCYNTA) 50 MG tablet    Sig: Take 1 tablet (50 mg total) by mouth daily as needed for moderate pain or severe pain.    Dispense:  30 tablet    Refill:  0    Chronic Pain: STOP Act (Not applicable) Fill 1 day early if closed on refill date. Avoid benzodiazepines within 8 hours of opioids   Excellent pain relief after previous lumbar radiofrequency ablation.  Okay to repeat after 6 months should she have return of pain at that time.   Follow-up plan:   Return in about 3 months (around 07/02/2021) for Medication Management, in person.     Status post left L3, L4, L5 RFA 12/01/2017, 12/14/2018, 06/21/2019.  Status post right L3, L4, L5 RFA on 12/22/2017, 12/28/2018, 06/21/2019, 07/10/2019 helps decrease her pain and improve her functional status for axial low back for approximately 6 to 8 months post RFA.  Can repeat every 6 to 12 months.  Left L4 Sprint peripheral nerve stimulation 03/13/2020, lead displacement/migration, lead removed and left L4 medial branch nerve PNS replacement on 05/15/2020, became dislodged again early February.   Right L3, L4, L5 RFA 09/30/2020, left L3, L4, L5 RFA 08/14/2020      Recent Visits Date Type Provider Dept  03/05/21 Procedure visit Gillis Santa, MD [facility]  02/19/21 Procedure visit Gillis Santa, MD [facility]  01/29/21 Telemedicine Gillis Santa, MD [facility]  Showing recent visits within past 90 days and meeting all other requirements Today's Visits Date Type Provider Dept  04/01/21 Office Visit Gillis Santa, MD [facility]  Showing today's visits and meeting all other requirements Future Appointments Date Type Provider Dept  06/26/21 Appointment Gillis Santa, MD [facility]  Showing future appointments within next 90 days and meeting all other requirements I discussed the assessment and treatment plan with the patient. The patient was provided an opportunity to ask questions  and all were answered. The patient agreed with the plan and demonstrated an understanding of the instructions.  Patient advised to call back or seek an in-person evaluation if the symptoms or condition worsens.  Duration of encounter: 30 minutes.  Note by: Gillis Santa, MD Date: 04/01/2021; Time: 11:43 AM

## 2021-04-10 NOTE — Telephone Encounter (Signed)
To sign

## 2021-05-22 ENCOUNTER — Telehealth: Payer: Self-pay

## 2021-05-22 DIAGNOSIS — M47816 Spondylosis without myelopathy or radiculopathy, lumbar region: Secondary | ICD-10-CM

## 2021-05-22 MED ORDER — METHYLPREDNISOLONE 4 MG PO TBPK: ORAL_TABLET | ORAL | 0 refills | Status: AC

## 2021-05-22 NOTE — Telephone Encounter (Signed)
Pt states she can barely walk

## 2021-05-22 NOTE — Telephone Encounter (Signed)
Returned the patient call.  States she has pain in the right hip going down the leg and it hurts extremely bad to bare weight on it and walk.  This has happened 1 time before and she states it was treated with a prednisone taper.

## 2021-05-22 NOTE — Telephone Encounter (Signed)
Sent to [facility]

## 2021-05-22 NOTE — Telephone Encounter (Signed)
Patient aware that taper has been sent in

## 2021-06-24 ENCOUNTER — Encounter: Payer: Self-pay | Admitting: Student in an Organized Health Care Education/Training Program

## 2021-06-24 ENCOUNTER — Ambulatory Visit
Payer: Medicare Other | Attending: Student in an Organized Health Care Education/Training Program | Admitting: Student in an Organized Health Care Education/Training Program

## 2021-06-24 ENCOUNTER — Other Ambulatory Visit: Payer: Self-pay

## 2021-06-24 VITALS — BP 149/81 | HR 89 | Temp 97.8°F | Resp 18 | Ht 66.0 in | Wt 280.0 lb

## 2021-06-24 DIAGNOSIS — G894 Chronic pain syndrome: Secondary | ICD-10-CM | POA: Insufficient documentation

## 2021-06-24 DIAGNOSIS — M62830 Muscle spasm of back: Secondary | ICD-10-CM | POA: Diagnosis present

## 2021-06-24 DIAGNOSIS — M5136 Other intervertebral disc degeneration, lumbar region: Secondary | ICD-10-CM | POA: Diagnosis present

## 2021-06-24 DIAGNOSIS — M47816 Spondylosis without myelopathy or radiculopathy, lumbar region: Secondary | ICD-10-CM | POA: Insufficient documentation

## 2021-06-24 MED ORDER — TAPENTADOL HCL 50 MG PO TABS: 50.0000 mg | ORAL_TABLET | Freq: Every day | ORAL | 0 refills | Status: AC | PRN

## 2021-06-24 NOTE — Progress Notes (Signed)
Nursing Pain Medication Assessment:  Safety precautions to be maintained throughout the outpatient stay will include: orient to surroundings, keep bed in low position, maintain call bell within reach at all times, provide assistance with transfer out of bed and ambulation.  Medication Inspection Compliance: Pill count conducted under aseptic conditions, in front of the patient. Neither the pills nor the bottle was removed from the patient's sight at any time. Once count was completed pills were immediately returned to the patient in their original bottle.  Medication: Tapentadol (Nucynta) Pill/Patch Count:  12 of 30 pills remain Pill/Patch Appearance: Markings consistent with prescribed medication Bottle Appearance: Standard pharmacy container. Clearly labeled. Filled Date: 88 / 20 / 2022 Last Medication intake:  Today

## 2021-06-24 NOTE — Patient Instructions (Signed)

## 2021-06-24 NOTE — Progress Notes (Signed)
PROVIDER NOTE: Information contained herein reflects review and annotations entered in association with encounter. Interpretation of such information and data should be left to medically-trained personnel. Information provided to patient can be located elsewhere in the medical record under "Patient Instructions". Document created using STT-dictation technology, any transcriptional errors that may result from process are unintentional.    Patient: [name]  Service Category: E/M  Provider: Gillis Santa, MD  DOB: 05/24/1946  DOS: 06/24/2021  Specialty: Interventional Pain Management  MRN: [id number]  Setting: Ambulatory outpatient  PCP: Kirk Ruths, MD  Type: Established Patient    Referring Provider: Kirk Ruths, MD  Location: Office  Delivery: Face-to-face     HPI  [name], a 76 y.o. year old female, is here today because of her Lumbar facet arthropathy [M47.816]. [name]'s primary complain today is Hip Pain (right)  Last encounter: My last encounter with her was on 04/01/21 Pertinent problems: [name] has Lumbar spondylosis; Lumbar facet arthropathy; Lumbar degenerative disc disease; Spasm of lumbar paraspinous muscle; and Chronic pain syndrome on their pertinent problem list. Pain Assessment: Severity of Chronic pain is reported as a 4 /10. Location: Back Lower/raditates down right leg to knee. Onset: More than a month ago. Quality: Aching, Sharp. Timing: Intermittent. Modifying factor(s): rest. Vitals:  height is 5' 6"  (1.676 m) and weight is 280 lb (127 kg). Her temperature is 97.8 F (36.6 C). Her blood pressure is 149/81 (abnormal) and her pulse is 89. Her respiration is 18 and oxygen saturation is 99%.   Reason for encounter: medication management.    Patient presents for medication management. No change in dose.  No side effects with her medication.  They do help with her chronic low back pain  and help her function better.  I will refill her Nucynta as below, 50 mg daily. Is having increased lower back pain secondary to lumbar spondylosis and facet arthropathy.  Previous lumbar radiofrequency ablations were done on 03/05/2021 for the right side and 02/19/2021 for the left side.  She would like to repeat these.  They provided 80% pain relief for almost 3-1/2 to 4 months.   Pharmacotherapy Assessment   Analgesic: Nucynta 50 mg daily, quantity 30/month.     Monitoring: [place] PMP: PDMP reviewed during this encounter.       Pharmacotherapy: No side-effects or adverse reactions reported. Compliance: No problems identified. Effectiveness: Clinically acceptable.  Dewayne Shorter, RN  06/24/2021 11:06 AM  Sign when Signing Visit Nursing Pain Medication Assessment:  Safety precautions to be maintained throughout the outpatient stay will include: orient to surroundings, keep bed in low position, maintain call bell within reach at all times, provide assistance with transfer out of bed and ambulation.  Medication Inspection Compliance: Pill count conducted under aseptic conditions, in front of the patient. Neither the pills nor the bottle was removed from the patient's sight at any time. Once count was completed pills were immediately returned to the patient in their original bottle.  Medication: Tapentadol (Nucynta) Pill/Patch Count:  12 of 30 pills remain Pill/Patch Appearance: Markings consistent with prescribed medication Bottle Appearance: Standard pharmacy container. Clearly labeled. Filled Date: 46 / 20 / 2022 Last Medication intake:  Today   UDS:  Summary  Date Value Ref Range Status  08/01/2020 Note  Final    Comment:    ==================================================================== [employer] Select 13 (MW) ==================================================================== Test  Result       Flag       Units  Drug Present and Declared for  Prescription Verification   Tapentadol                     5398         EXPECTED   ng/mg creat    Source of tapentadol is a scheduled prescription medication.  ==================================================================== Test                      Result    Flag   Units      Ref Range   Creatinine              158              mg/dL      >=20 ==================================================================== Declared Medications:  The flagging and interpretation on this report are based on the  following declared medications.  Unexpected results may arise from  inaccuracies in the declared medications.   **Note: The testing scope of this panel includes these medications:   Tapentadol (Nucynta)   **Note: The testing scope of this panel does not include the  following reported medications:   Acetaminophen (Tylenol)  Albuterol  Amantadine  Amlodipine (Norvasc)  Aripiprazole (Abilify)  Aspirin  Azelastine (Astelin)  Baclofen (Lioresal)  Biotin  Carvedilol (Coreg)  Celecoxib (Celebrex)  Donepezil (Aricept)  Esomeprazole (Nexium)  Fluticasone (Breo)  Fluticasone (Flonase)  Folic Acid  Guaifenesin (Robitussin)  Levothyroxine (Synthroid)  Losartan (Cozaar)  Magnesium  Melatonin  Montelukast (Singulair)  Multivitamin  Nystatin (Mycostatin)  Potassium (Klor-Con)  Topiramate (Topamax)  Torsemide (Demadex)  Trazodone (Desyrel)  Undefined Miscellaneous Drug  Venlafaxine  Vilanterol (Breo)  Vitamin C  Vitamin D3  Zinc ==================================================================== For clinical consultation, please call [phone]. ====================================================================      ROS  Constitutional: Denies any fever or chills Gastrointestinal: No reported hemesis, hematochezia, vomiting, or acute GI distress Musculoskeletal:  Low back pain related to lumbar facet arthropathy Neurological: No reported episodes of acute onset  apraxia, aphasia, dysarthria, agnosia, amnesia, paralysis, loss of coordination, or loss of consciousness  Medication Review  ARIPiprazole, Biotin, OnabotulinumtoxinA, Semaglutide, Venlafaxine HCl, Vitamin D3, acetaminophen, albuterol, amLODipine, amantadine, aspirin EC, azelastine, baclofen, carvedilol, celecoxib, donepezil, esomeprazole, fluticasone, fluticasone furoate-vilanterol, folic acid, guaiFENesin-dextromethorphan, levothyroxine, losartan, magnesium, melatonin, montelukast, multivitamin, nystatin, potassium chloride, potassium chloride SA, tapentadol, topiramate, torsemide, traZODone, vitamin C, and zinc gluconate  History Review  Allergy: [name] is allergic to codeine, povidone iodine, hydromorphone, latex, oxycodone-acetaminophen, sulfa antibiotics, bupropion, gabapentin, hydrocodone, morphine and related, and procaine. Drug: [name]  reports no history of drug use. Alcohol:  reports current alcohol use of about 2.0 standard drinks per week. Tobacco:  reports that she has never smoked. She has never used smokeless tobacco. Social: [name]  reports that she has never smoked. She has never used smokeless tobacco. She reports current alcohol use of about 2.0 standard drinks per week. She reports that she does not use drugs. Medical:  has a past medical history of Benign positional vertigo (03/04/2016), Brain injury, Climacteric, Depression, DJD (degenerative joint disease), Dyslipidemia, GERD (gastroesophageal reflux disease), Hypertension, Hypokalemia, Hypothyroidism, IBS (irritable bowel syndrome), OAB (overactive bladder), Ovarian cyst (03/25/2015), Rhinitis, Sleep apnea, and Spasm of thoracolumbar muscle. Surgical: [name]  has a past surgical history that includes Ankle reconstruction (Left); Brachioplasty; Cataract extraction; Cholecystectomy; Soft Tissue Tumor Resection; Abdominal hysterectomy (07/12/2014); Joint  replacement (Bilateral, 07/2014,  03/2015); nasal reconstrucion; Toe Surgery (Right); Shoulder open rotator cuff repair; Colonoscopy with propofol (N/A, 12/07/2016); Reduction mammaplasty (Bilateral); and Breast reduction surgery (Bilateral, 2020). Family: family history includes Breast cancer in her paternal aunt; Lung cancer in her father and mother.  Laboratory Chemistry Profile   Renal Lab Results  Component Value Date   BUN 25 (H) 07/06/2020   CREATININE 0.99 07/06/2020   GFRNONAA 60 (L) 07/06/2020     Hepatic Lab Results  Component Value Date   AST 25 07/02/2020   ALT 21 07/02/2020   ALBUMIN 3.7 07/02/2020   ALKPHOS 80 07/02/2020     Electrolytes Lab Results  Component Value Date   NA 136 07/06/2020   K 4.1 07/06/2020   CL 98 07/06/2020   CALCIUM 9.0 07/06/2020   MG 2.0 07/06/2020   PHOS 3.0 07/06/2020     Bone No results found for: VD25OH, NO676HM0NOB, SJ6283MO2, HU7654YT0, 25OHVITD1, 25OHVITD2, 25OHVITD3, TESTOFREE, TESTOSTERONE   Inflammation (CRP: Acute Phase) (ESR: Chronic Phase) Lab Results  Component Value Date   LATICACIDVEN 1.1 07/02/2020       Note: Above Lab results reviewed.   Physical Exam  General appearance: Well nourished, well developed, and well hydrated. In no apparent acute distress Mental status: Alert, oriented x 3 (person, place, & time)       Respiratory: No evidence of acute respiratory distress Eyes: PERLA Vitals: BP (!) 149/81 (BP Location: Right Arm, Patient Position: Sitting, Cuff Size: Large)    Pulse 89    Temp 97.8 F (36.6 C)    Resp 18    Ht 5' 6"  (1.676 m)    Wt 280 lb (127 kg)    SpO2 99%    BMI 45.19 kg/m  BMI: Estimated body mass index is 45.19 kg/m as calculated from the following:   Height as of this encounter: 5' 6"  (1.676 m).   Weight as of this encounter: 280 lb (127 kg). Ideal: Ideal body weight: 59.3 kg (130 lb 11.7 oz) Adjusted ideal body weight: 86.4 kg (190 lb 7 oz)  Positive low back pain related to lumbar  facet arthropathy  right hip pain Antalgic gait, presents today in wheelchair  Assessment   Diagnosis  1. Lumbar facet arthropathy   2. Lumbar spondylosis   3. Lumbar degenerative disc disease   4. Spasm of lumbar paraspinous muscle   5. Chronic pain syndrome        Plan of Care   Ms. Vermell J Hollifield [name] has a current medication list which includes the following long-term medication(s): albuterol, amantadine, amlodipine, aripiprazole, azelastine, carvedilol, donepezil, esomeprazole, levothyroxine, losartan, montelukast, potassium chloride, potassium chloride sa, torsemide, trazodone, and venlafaxine hcl.   1.  Refill Nucynta as below.  No change in dose 2.  Continue psychiatric medications.  Management of depression and anxiety can also impact chronic pain experience 3.  Repeat lumbar radiofrequency ablation as below starting with the right side.  Requested Prescriptions   Signed Prescriptions Disp Refills   tapentadol (NUCYNTA) 50 MG tablet 30 tablet 0    Sig: Take 1 tablet (50 mg total) by mouth daily as needed for moderate pain or severe pain.   tapentadol (NUCYNTA) 50 MG tablet 30 tablet 0    Sig: Take 1 tablet (50 mg total) by mouth daily as needed for moderate pain or severe pain.   tapentadol (NUCYNTA) 50 MG tablet 30 tablet 0    Sig: Take 1 tablet (50 mg total) by mouth daily as needed for moderate pain  or severe pain.   Orders:  Orders Placed This Encounter  Procedures   Radiofrequency,Lumbar    Standing Status:   Future    Standing Expiration Date:   12/22/2021    Scheduling Instructions:     Side(s):RIGHT     Level(s): L3, L4, L5, Medial Branch Nerve(s)     Sedation: PO Valium     Scheduling Timeframe: As soon as pre-approved    Order Specific Question:   Where will this procedure be performed?    Answer:   [facility]   Follow-up plan:   Return in about 29 days (around 07/23/2021) for R L3, L4, L5 RFA (PO Valium).     Status post left L3,  L4, L5 RFA 12/01/2017, 12/14/2018, 06/21/2019.  Status post right L3, L4, L5 RFA on 12/22/2017, 12/28/2018, 06/21/2019, 07/10/2019 helps decrease her pain and improve her functional status for axial low back for approximately 6 to 8 months post RFA.  Can repeat every 6 to 12 months.  Left L4 Sprint peripheral nerve stimulation 03/13/2020, lead displacement/migration, lead removed and left L4 medial branch nerve PNS replacement on 05/15/2020, became dislodged again early February.   Right L3, L4, L5 RFA 09/30/2020, left L3, L4, L5 RFA 08/14/2020    Recent Visits Date Type Provider Dept  04/01/21 Office Visit Gillis Santa, MD [facility]  Showing recent visits within past 90 days and meeting all other requirements Today's Visits Date Type Provider Dept  06/24/21 Office Visit Gillis Santa, MD [facility]  Showing today's visits and meeting all other requirements Future Appointments Date Type Provider Dept  07/30/21 Appointment Gillis Santa, MD [facility]  Showing future appointments within next 90 days and meeting all other requirements  I discussed the assessment and treatment plan with the patient. The patient was provided an opportunity to ask questions and all were answered. The patient agreed with the plan and demonstrated an understanding of the instructions.  Patient advised to call back or seek an in-person evaluation if the symptoms or condition worsens.  Duration of encounter:30 minutes.  Note by: Gillis Santa, MD Date: 06/24/2021; Time: 11:59 AM

## 2021-06-26 ENCOUNTER — Encounter: Payer: Medicare Other | Admitting: Student in an Organized Health Care Education/Training Program

## 2021-07-30 ENCOUNTER — Encounter: Payer: Self-pay | Admitting: Student in an Organized Health Care Education/Training Program

## 2021-07-30 ENCOUNTER — Ambulatory Visit (HOSPITAL_BASED_OUTPATIENT_CLINIC_OR_DEPARTMENT_OTHER): Payer: Medicare Other | Admitting: Student in an Organized Health Care Education/Training Program

## 2021-07-30 ENCOUNTER — Other Ambulatory Visit: Payer: Self-pay

## 2021-07-30 ENCOUNTER — Ambulatory Visit
Admission: RE | Admit: 2021-07-30 | Discharge: 2021-07-30 | Disposition: A | Discharge status: Home / Self Care | Payer: Medicare Other | Source: Ambulatory Visit | Attending: Student in an Organized Health Care Education/Training Program | Admitting: Student in an Organized Health Care Education/Training Program

## 2021-07-30 VITALS — BP 159/79 | HR 82 | Temp 96.8°F | Resp 13 | Ht 66.0 in | Wt 268.0 lb

## 2021-07-30 DIAGNOSIS — G894 Chronic pain syndrome: Secondary | ICD-10-CM | POA: Insufficient documentation

## 2021-07-30 DIAGNOSIS — M545 Low back pain, unspecified: Secondary | ICD-10-CM | POA: Diagnosis not present

## 2021-07-30 DIAGNOSIS — M47816 Spondylosis without myelopathy or radiculopathy, lumbar region: Secondary | ICD-10-CM

## 2021-07-30 DIAGNOSIS — M47896 Other spondylosis, lumbar region: Secondary | ICD-10-CM | POA: Diagnosis not present

## 2021-07-30 DIAGNOSIS — M4696 Unspecified inflammatory spondylopathy, lumbar region: Secondary | ICD-10-CM | POA: Insufficient documentation

## 2021-07-30 MED ORDER — DIAZEPAM 5 MG PO TABS
5.0000 mg | ORAL_TABLET | ORAL | Status: AC
  Administered 2021-07-30: 5 mg via ORAL

## 2021-07-30 MED ORDER — LIDOCAINE HCL 2 % IJ SOLN
20.0000 mL | Freq: Once | INTRAMUSCULAR | Status: AC
  Administered 2021-07-30: 400 mg

## 2021-07-30 MED ORDER — DEXAMETHASONE SODIUM PHOSPHATE 10 MG/ML IJ SOLN
10.0000 mg | Freq: Once | INTRAMUSCULAR | Status: AC
  Administered 2021-07-30: 10 mg

## 2021-07-30 MED ORDER — ROPIVACAINE HCL 2 MG/ML IJ SOLN
9.0000 mL | Freq: Once | INTRAMUSCULAR | Status: AC
  Administered 2021-07-30: 20 mL via PERINEURAL

## 2021-07-30 MED ORDER — DIAZEPAM 5 MG PO TABS
ORAL_TABLET | ORAL | Status: AC
  Filled 2021-07-30: qty 1

## 2021-07-30 MED ORDER — ROPIVACAINE HCL 2 MG/ML IJ SOLN
INTRAMUSCULAR | Status: AC
  Filled 2021-07-30: qty 20

## 2021-07-30 MED ORDER — LIDOCAINE HCL 2 % IJ SOLN
INTRAMUSCULAR | Status: AC
  Filled 2021-07-30: qty 20

## 2021-07-30 MED ORDER — DEXAMETHASONE SODIUM PHOSPHATE 10 MG/ML IJ SOLN
INTRAMUSCULAR | Status: AC
  Filled 2021-07-30: qty 1

## 2021-07-30 NOTE — Patient Instructions (Addendum)
____________________________________________________________________________________________  Post-procedure Information What to expect: Most procedures involve the use of a local anesthetic (numbing medicine), and a steroid (anti-inflammatory medicine).  The local anesthetics may cause temporary numbness and weakness of the legs or arms, depending on the location of the block. This numbness/weakness may last 4-6 hours, depending on the local anesthetic used. In rare instances, it can last up to 24 hours. While numb, you must be very careful not to injure the extremity.  After any procedure, you could expect the pain to get better within 15-20 minutes. This relief is temporary and may last 4-6 hours. Once the local anesthetics wears off, you could experience discomfort, possibly more than usual, for up to 10 (ten) days. In the case of radiofrequencies, it may last up to 6 weeks. Surgeries may take up to 8 weeks for the healing process. The discomfort is due to the irritation caused by needles going through skin and muscle. To minimize the discomfort, we recommend using ice the first day, and heat from then on. The ice should be applied for 15 minutes on, and 15 minutes off. Keep repeating this cycle until bedtime. Avoid applying the ice directly to the skin, to prevent frostbite. Heat should be used daily, until the pain improves (4-10 days). Be careful not to burn yourself.  Occasionally you may experience muscle spasms or cramps. These occur as a consequence of the irritation caused by the needle sticks to the muscle and the blood that will inevitably be lost into the surrounding muscle tissue. Blood tends to be very irritating to tissues, which tend to react by going into spasm. These spasms may start the same day of your procedure, but they may also take days to develop. This late onset type of spasm or cramp is usually caused by electrolyte imbalances triggered by the steroids, at the level of the  kidney. Cramps and spasms tend to respond well to muscle relaxants, multivitamins (some are triggered by the procedure, but may have their origins in vitamin deficiencies), and Gatorade, or any sports drinks that can replenish any electrolyte imbalances. (If you are a diabetic, ask your pharmacist to get you a sugar-free brand.) Warm showers or baths may also be helpful. Stretching exercises are highly recommended.  General Instructions:  Be alert for signs of possible infection: redness, swelling, heat, red streaks, elevated temperature, and/or fever. These typically appear 4 to 6 days after the procedure. Immediately notify your doctor if you experience unusual bleeding, difficulty breathing, or loss of bowel or bladder control. If you experience increased pain, do not increase your pain medicine intake, unless instructed by your pain physician.  Post-Procedure Care:  Be careful in moving about. Muscle spasms in the area of the injection may occur. Applying ice or heat to the area is often helpful. The incidence of spinal headaches after epidural injections ranges between 1.4% and 6%. If you develop a headache that does not seem to respond to conservative therapy, please let your physician know. This can be treated with an epidural blood patch.   Post-procedure numbness or redness is to be expected, however it should average 4 to 6 hours. If numbness and weakness of your extremities begins to develop 4 to 6 hours after your procedure, and is felt to be progressing and worsening, immediately contact your physician.  Diet:  If you experience nausea, do not eat until this sensation goes away. If you had a Stellate Ganglion Block for upper extremity Reflex Sympathetic Dystrophy, do not eat or  drink until your hoarseness goes away. In any case, always start with liquids first and if you tolerate them well, then slowly progress to more solid foods.  Activity:  For the first 4 to 6 hours after the  procedure, use caution in moving about as you may experience numbness and/or weakness. Use caution in cooking, using household electrical appliances, and climbing steps. If you need to reach your Doctor call our office: [phone] (During business hours) or [phone] (After business hours).  Business Hours: Monday-Thursday 8:00 am - 4:00 PM    Fridays: Closed     In case of an emergency: In case of emergency, call 911 or go to the nearest emergency room and have the physician there call us.  Interpretation of Procedure Every nerve block has two components: a diagnostic component, and a treatment component. Unrealistic expectations are the most common causes of perceived failure.  In a perfect world, a single nerve block should be able to completely and permanently eliminate the pain. Sadly, the world is not perfect.  Most pain management nerve blocks are performed using local anesthetics and steroids. Steroids are responsible for any long-term benefit that you may experience. Their purpose is to decrease any chronic swelling that may exist in the area. Steroids begin to work immediately after being injected. However, most patients will not experience any benefits until 5 to 10 days after the injection, when the swelling has come down to the point where they can tell a difference. Steroids will only help if there is swelling to be treated. As such, they can assist with the diagnosis. If effective, they suggest an inflammatory component to the pain, and if ineffective, they rule out inflammation as the main cause or component of the problem. If the problem is one of mechanical compression, you will get no benefit from those steroids.   In the case of local anesthetics, they have a crucial role in the diagnosis of your condition. Most will begin to work within15 to 20 minutes after injection. The duration will depend on the type used (short- vs. Long-acting). It is of outmost importance that  patients keep tract of their pain, after the procedure. To assist with this matter, a Post-procedure Pain Diary is provided. Make sure to complete it and to bring it back to your follow-up appointment.  As long as the patient keeps accurate, detailed records of their symptoms after every procedure, and returns to have those interpreted, every procedure will provide [name] with invaluable information. Even a block that does not provide the patient with any relief, will always provide [name] with information about the mechanism and the origin of the pain. The only time a nerve block can be considered a waste of time is when patients do not keep track of the results, or do not keep their post-procedure appointment.  Reporting the results back to your physician The Pain Score  Pain is a subjective complaint. It cannot be seen, touched, or measured. We depend entirely on the patients report of the pain in order to assess your condition and treatment. To evaluate the pain, we use a pain scale, where 0 means No Pain, and a 10 is the worst possible pain that you can even imagine (i.e. something like been eaten alive by a shark or being torn apart by a lion).   Use the Pain Scale provided. You will frequently be asked to rate your pain. Please be accurate, remember that medical decisions will be based on your  responses. Please do not rate your pain above a 10. Doing so is actually interpreted as symptom magnification (exaggeration). To put this into perspective, when you tell us that your pain is at a 10 (ten), what you are saying is that there is nothing we can do to make this pain any worse. (Carefully think about that.) ____________________________________________________________________________________________  ______________________________________________________________________  Preparing for your procedure (without sedation)  Procedure appointments are limited to planned procedures: No  Prescription Refills. No disability issues will be discussed. No medication changes will be discussed.  Instructions: Oral Intake: Do not eat or drink anything for at least 6 hours prior to your procedure. (Exception: Blood Pressure Medication. See below.) Transportation: Unless otherwise stated by your physician, you may drive yourself after the procedure. Blood Pressure Medicine: Do not forget to take your blood pressure medicine with a sip of water the morning of the procedure. If your Diastolic (lower reading)is above 100 mmHg, elective cases will be cancelled/rescheduled. Blood thinners: These will need to be stopped for procedures. Notify our staff if you are taking any blood thinners. Depending on which one you take, there will be specific instructions on how and when to stop it. Diabetics on insulin: Notify the staff so that you can be scheduled 1st case in the morning. If your diabetes requires high dose insulin, take only  of your normal insulin dose the morning of the procedure and notify the staff that you have done so. Preventing infections: Shower with an antibacterial soap the morning of your procedure.  Build-up your immune system: Take 1000 mg of Vitamin C with every meal (3 times a day) the day prior to your procedure. Antibiotics: Inform the staff if you have a condition or reason that requires you to take antibiotics before dental procedures. Pregnancy: If you are pregnant, call and cancel the procedure. Sickness: If you have a cold, fever, or any active infections, call and cancel the procedure. Arrival: You must be in the facility at least 30 minutes prior to your scheduled procedure. Children: Do not bring any children with you. Dress appropriately: Bring dark clothing that you would not mind if they get stained. Valuables: Do not bring any jewelry or valuables.  Reasons to call and reschedule or cancel your procedure: (Following these recommendations will minimize the  risk of a serious complication.) Surgeries: Avoid having procedures within 2 weeks of any surgery. (Avoid for 2 weeks before or after any surgery). Flu Shots: Avoid having procedures within 2 weeks of a flu shots or . (Avoid for 2 weeks before or after immunizations). Barium: Avoid having a procedure within 7-10 days after having had a radiological study involving the use of radiological contrast. (Myelograms, Barium swallow or enema study). Heart attacks: Avoid any elective procedures or surgeries for the initial 6 months after a "Myocardial Infarction" (Heart Attack). Blood thinners: It is imperative that you stop these medications before procedures. Let us know if you if you take any blood thinner.  Infection: Avoid procedures during or within two weeks of an infection (including chest colds or gastrointestinal problems). Symptoms associated with infections include: Localized redness, fever, chills, night sweats or profuse sweating, burning sensation when voiding, cough, congestion, stuffiness, runny nose, sore throat, diarrhea, nausea, vomiting, cold or Flu symptoms, recent or current infections. It is specially important if the infection is over the area that we intend to treat. Heart and lung problems: Symptoms that may suggest an active cardiopulmonary problem include: cough, chest pain, breathing difficulties or shortness of  breath, dizziness, ankle swelling, uncontrolled high or unusually low blood pressure, and/or palpitations. If you are experiencing any of these symptoms, cancel your procedure and contact your primary care physician for an evaluation.  Remember:  Regular Business hours are:  Monday to Thursday 8:00 AM to 4:00 PM  Provider's Schedule: Delano Metz, MD:  Procedure days: Tuesday and Thursday 7:30 AM to 4:00 PM  Edward Jolly, MD:  Procedure days: Monday and Wednesday 7:30 AM to 4:00 PM ______________________________________________________________________   Radiofrequency Ablation Radiofrequency ablation is a procedure that is performed to relieve pain. The procedure is often used for back, neck, or arm pain. Radiofrequency ablation involves the use of a machine that creates radio waves to make heat. During the procedure, the heat is applied to the nerve that carries the pain signal. The heat damages the nerve and interferes with the pain signal. Pain relief usually starts about 2 weeks after the procedure and lasts for 6 months to 1 year. Tell a health care provider about: Any allergies you have. All medicines you are taking, including vitamins, herbs, eye drops, creams, and over-the-counter medicines. Any problems you or family members have had with anesthetic medicines. Any bleeding problems you have. Any surgeries you have had. Any medical conditions you have. Whether you are pregnant or may be pregnant. What are the risks? Generally, this is a safe procedure. However, problems may occur, including: Pain or soreness at the injection site. Allergic reaction to medicines given during the procedure. Bleeding. Infection at the injection site. Damage to nerves or blood vessels. What happens before the procedure? When to stop eating and drinking Follow instructions from your health care provider about what you may eat and drink before your procedure. These may include: 8 hours before the procedure Stop eating most foods. Do not eat meat, fried foods, or fatty foods. Eat only light foods, such as toast or crackers. All liquids are okay except energy drinks and alcohol. 6 hours before the procedure Stop eating. Drink only clear liquids, such as water, clear fruit juice, black coffee, plain tea, and sports drinks. Do not drink energy drinks or alcohol. 2 hours before the procedure Stop drinking all liquids. You may be allowed to take medicine with small sips of water. If you do not follow your health care provider's instructions, your  procedure may be delayed or canceled. Medicines Ask your health care provider about: Changing or stopping your regular medicines. This is especially important if you are taking diabetes medicines or blood thinners. Taking medicines such as aspirin and ibuprofen. These medicines can thin your blood. Do not take these medicines unless your health care provider tells you to take them. Taking over-the-counter medicines, vitamins, herbs, and supplements. General instructions Ask your health care provider what steps will be taken to help prevent infection. These steps may include: Removing hair at the procedure site. Washing skin with a germ-killing soap. Taking antibiotic medicine. If you will be going home right after the procedure, plan to have a responsible adult: Take you home from the hospital or clinic. You will not be allowed to drive. Care for you for the time you are told. What happens during the procedure?  You will be awake during the procedure. You will need to be able to talk with the health care provider during the procedure. An IV will be inserted into one of your veins. You will be given one or more of the following: A medicine to help you relax (sedative). A medicine to numb  the area (local anesthetic). Your health care provider will insert a radiofrequency needle into the area to be treated. This is done with the help of fluoroscopy. A wire that carries the radio waves (electrode) will be put through the radiofrequency needle. An electrical pulse will be sent through the electrode to verify the correct nerve that is causing your pain. You will feel a tingling sensation, and you may have muscle twitching. The tissue around the needle tip will be heated by an electric current that comes from the radiofrequency machine. This will numb the nerves. The needle will be removed. A bandage (dressing) will be put on the insertion area. The procedure may vary among health care providers  and hospitals. What happens after the procedure? Your blood pressure, heart rate, breathing rate, and blood oxygen level will be monitored until you leave the hospital or clinic. Return to your normal activities as told by your health care provider. Ask your health care provider what activities are safe for you. If you were given a sedative during the procedure, it can affect you for several hours. Do not drive or operate machinery until your health care provider says that it is safe. Summary Radiofrequency ablation is a procedure that is performed to relieve pain. The procedure is often used for back, neck, or arm pain. Radiofrequency ablation involves the use of a machine that creates radio waves to make heat. Plan to have a responsible adult take you home from the hospital or clinic. Do not drive or operate machinery until your health care provider says that it is safe. Return to your normal activities as told by your health care provider. Ask your health care provider what activities are safe for you. This information is not intended to replace advice given to you by your health care provider. Make sure you discuss any questions you have with your health care provider. Document Revised: 11/19/2020 Document Reviewed: 11/19/2020 [employer] Patient Education  2022 [employer].

## 2021-07-30 NOTE — Progress Notes (Signed)
Patient's Name: [name]  MRN: [id number]  Referring Provider: Lauro Regulus, MD  DOB: 1945/06/21  PCP: Lauro Regulus, MD  DOS: 07/30/2021  Note by: Edward Jolly, MD  Service setting: Ambulatory outpatient  Specialty: Interventional Pain Management  Patient type: Established  Location: [facility] (AMB) Pain Management Facility  Visit type: Interventional Procedure   Primary Reason for Visit: Interventional Pain Management Treatment. CC: Back Pain (lower)   Procedure:       Anesthesia, Analgesia, Anxiolysis:  Type: Thermal Lumbar Facet, Medial Branch Radiofrequency Ablation/Neurotomy #8 Level: L3, L4, L5,  Medial Branch Level(s). These levels will denervate the L3-4, and the L4-5 lumbar facet joints. Primary Purpose: Therapeutic Region: Posterolateral Lumbosacral Spine Laterality: Right  Previously done 03/05/21  Type: Local Anesthesia with PO Valium 5 mg Indication(s): Analgesia and Anxiety Local Anesthetic: Lidocaine 1%   Indications: 1. Lumbar facet arthropathy   2. Lumbar spondylosis   3. Chronic pain syndrome     [name] has been dealing with the above chronic pain for longer than three months and has either failed to respond, was unable to tolerate, or simply did not get enough benefit from other more conservative therapies including, but not limited to: 1. Over-the-counter medications 2. Anti-inflammatory medications 3. Muscle relaxants 4. Membrane stabilizers 5. Opioids 6. Physical therapy 7. Modalities (Heat, ice, etc.) 8. Invasive techniques such as nerve blocks. [name] has a 76tt[age] more than 50% relief of the pain from a series of diagnostic injections conducted in separate occasions.  Pain Score: Pre-procedure: 5 /10 Post-procedure: 0-No pain/10  Pre-op Assessment:  [name] is a 76 y.o. (year old), female patient, seen today for interventional treatment. She  has a past surgical  history that includes Ankle reconstruction (Left); Brachioplasty; Cataract extraction; Cholecystectomy; Soft Tissue Tumor Resection; Abdominal hysterectomy (07/12/2014); Joint replacement (Bilateral, 07/2014, 03/2015); nasal reconstrucion; Toe Surgery (Right); Shoulder open rotator cuff repair; Colonoscopy with propofol (N/A, 12/07/2016); Reduction mammaplasty (Bilateral); and Breast reduction surgery (Bilateral, 2020). [name] has a 76 current medication list which includes the following prescription(s): acetaminophen, albuterol, amantadine, amlodipine, aripiprazole, vitamin c, aspirin ec, azelastine, baclofen, biotin, carvedilol, celecoxib, vitamin d3, donepezil, esomeprazole, fluticasone, fluticasone furoate-vilanterol, folic acid, guaifenesin-dextromethorphan, levothyroxine, losartan, magnesium, melatonin, montelukast, multivitamin, nystatin, onabotulinumtoxina, potassium chloride sa, rybelsus, tapentadol, [START ON 08/03/2021] tapentadol, [START ON 09/02/2021] tapentadol, topiramate, torsemide, trazodone, venlafaxine hcl, zinc gluconate, and potassium chloride. Her primarily concern today is the Back Pain (lower)   Initial Vital Signs:  Pulse/HCG Rate: 82ECG Heart Rate: 82 Temp: (!) 96.8 F (36 C) Resp: 14 BP: (!) 154/85 SpO2: 97 %  BMI: Estimated body mass index is 43.26 kg/m as calculated from the following:   Height as of this encounter: 5\' 6"  (1.676 m).   Weight as of this encounter: 268 lb (121.6 kg).  Risk Assessment: Allergies: Reviewed. She is allergic to codeine, povidone iodine, hydromorphone, latex, oxycodone-acetaminophen, sulfa antibiotics, bupropion, gabapentin, hydrocodone, morphine and related, and procaine.  Allergy Precautions: None required Coagulopathies: Reviewed. None identified.  Blood-thinner therapy: None at this time Active Infection(s): Reviewed. None identified. [name] is afebrile  Site Confirmation: [name] was  asked to confirm the procedure and laterality before marking the site Procedure checklist: Completed Consent: Before the procedure and under the influence of no sedative(s), amnesic(s), or anxiolytics, the patient was informed of the treatment options, risks and possible complications. To fulfill our ethical and legal obligations, as recommended by the American Medical Association's Code of Ethics, I  have informed the patient of my clinical impression; the nature and purpose of the treatment or procedure; the risks, benefits, and possible complications of the intervention; the alternatives, including doing nothing; the risk(s) and benefit(s) of the alternative treatment(s) or procedure(s); and the risk(s) and benefit(s) of doing nothing. The patient was provided information about the general risks and possible complications associated with the procedure. These may include, but are not limited to: failure to achieve desired goals, infection, bleeding, organ or nerve damage, allergic reactions, paralysis, and death. In addition, the patient was informed of those risks and complications associated to Spine-related procedures, such as failure to decrease pain; infection (i.e.: Meningitis, epidural or intraspinal abscess); bleeding (i.e.: epidural hematoma, subarachnoid hemorrhage, or any other type of intraspinal or peri-dural bleeding); organ or nerve damage (i.e.: Any type of peripheral nerve, nerve root, or spinal cord injury) with subsequent damage to sensory, motor, and/or autonomic systems, resulting in permanent pain, numbness, and/or weakness of one or several areas of the body; allergic reactions; (i.e.: anaphylactic reaction); and/or death. Furthermore, the patient was informed of those risks and complications associated with the medications. These include, but are not limited to: allergic reactions (i.e.: anaphylactic or anaphylactoid reaction(s)); adrenal axis suppression; blood sugar elevation that in  diabetics may result in ketoacidosis or comma; water retention that in patients with history of congestive heart failure may result in shortness of breath, pulmonary edema, and decompensation with resultant heart failure; weight gain; swelling or edema; medication-induced neural toxicity; particulate matter embolism and blood vessel occlusion with resultant organ, and/or nervous system infarction; and/or aseptic necrosis of one or more joints. Finally, the patient was informed that Medicine is not an exact science; therefore, there is also the possibility of unforeseen or unpredictable risks and/or possible complications that may result in a catastrophic outcome. The patient indicated having understood very clearly. We have given the patient no guarantees and we have made no promises. Enough time was given to the patient to ask questions, all of which were answered to the patient's satisfaction. [name] has indicated that she wanted to continue with the procedure. Attestation: I, the ordering provider, attest that I have discussed with the patient the benefits, risks, side-effects, alternatives, likelihood of achieving goals, and potential problems during recovery for the procedure that I have provided informed consent. Date   Time: 07/30/2021  9:57 AM  Pre-Procedure Preparation:  Monitoring: As per clinic protocol. Respiration, ETCO2, SpO2, BP, heart rate and rhythm monitor placed and checked for adequate function Safety Precautions: Patient was assessed for positional comfort and pressure points before starting the procedure. Time-out: I initiated and conducted the "Time-out" before starting the procedure, as per protocol. The patient was asked to participate by confirming the accuracy of the "Time Out" information. Verification of the correct person, site, and procedure were performed and confirmed by me, the nursing staff, and the patient. "Time-out" conducted as per Joint Commission's  Universal Protocol (UP.01.01.01). Time: 1029  Description of Procedure:       Position: Prone Laterality: Right Levels:   L3, L4, L5,  Medial Branch Level(s), at the L3-4 and the L4-5 lumbar facet joints. Area Prepped: Lumbosacral Prepping solution: ChloraPrep (2% chlorhexidine gluconate and 70% isopropyl alcohol) Safety Precautions: Aspiration looking for blood return was conducted prior to all injections. At no point did we inject any substances, as a needle was being advanced. Before injecting, the patient was told to immediately notify me if she was experiencing any new onset of "ringing in  the ears, or metallic taste in the mouth". No attempts were made at seeking any paresthesias. Safe injection practices and needle disposal techniques used. Medications properly checked for expiration dates. SDV (single dose vial) medications used. After the completion of the procedure, all disposable equipment used was discarded in the proper designated medical waste containers. Local Anesthesia: Protocol guidelines were followed. The patient was positioned over the fluoroscopy table. The area was prepped in the usual manner. The time-out was completed. The target area was identified using fluoroscopy. A 12-in long, straight, sterile hemostat was used with fluoroscopic guidance to locate the targets for each level blocked. Once located, the skin was marked with an approved surgical skin marker. Once all sites were marked, the skin (epidermis, dermis, and hypodermis), as well as deeper tissues (fat, connective tissue and muscle) were infiltrated with a small amount of a short-acting local anesthetic, loaded on a 10cc syringe with a 25G, 1.5-in  Needle. An appropriate amount of time was allowed for local anesthetics to take effect before proceeding to the next step. Local Anesthetic: Lidocaine 1.0% The unused portion of the local anesthetic was discarded in the proper designated containers. Technical explanation of  process:  Radiofrequency Ablation (RFA)  L3 Medial Branch Nerve RFA: The target area for the L3 medial branch is at the junction of the postero-lateral aspect of the superior articular process and the superior, posterior, and medial edge of the transverse process of L4. Under fluoroscopic guidance, a Radiofrequency needle was inserted until contact was made with os over the superior postero-lateral aspect of the pedicular shadow (target area). Sensory and motor testing was conducted to properly adjust the position of the needle. Once satisfactory placement of the needle was achieved, the numbing solution was slowly injected after negative aspiration for blood. 1 mL of the nerve block solution was injected without difficulty or complication. After waiting for at least 3 minutes, the ablation was performed. Once completed, the needle was removed intact. L4 Medial Branch Nerve RFA: The target area for the L4 medial branch is at the junction of the postero-lateral aspect of the superior articular process and the superior, posterior, and medial edge of the transverse process of L5. Under fluoroscopic guidance, a Radiofrequency needle was inserted until contact was made with os over the superior postero-lateral aspect of the pedicular shadow (target area). Sensory and motor testing was conducted to properly adjust the position of the needle. Once satisfactory placement of the needle was achieved, the numbing solution was slowly injected after negative aspiration for blood. 1mL of the nerve block solution was injected without difficulty or complication. After waiting for at least 3 minutes, the ablation was performed. Once completed, the needle was removed intact. L5 Medial Branch Nerve RFA: The target area for the L5 medial branch is at the junction of the postero-lateral aspect of the superior articular process of S1 and the superior, posterior, and medial edge of the sacral ala. Under fluoroscopic guidance, a  Radiofrequency needle was inserted until contact was made with os over the superior postero-lateral aspect of the pedicular shadow (target area). Sensory and motor testing was conducted to properly adjust the position of the needle. Once satisfactory placement of the needle was achieved, the numbing solution was slowly injected after negative aspiration for blood. 1mL of the nerve block solution was injected without difficulty or complication. After waiting for at least 3 minutes, the ablation was performed. Once completed, the needle was removed intact.  Radiofrequency lesioning (ablation):  Radiofrequency Generator:  [employer] Sensory Stimulation Parameters: 50 Hz was used to locate & identify the nerve, making sure that the needle was positioned such that there was no sensory stimulation below 0.3 V or above 0.7 V. Motor Stimulation Parameters: 2 Hz was used to evaluate the motor component. Care was taken not to lesion any nerves that demonstrated motor stimulation of the lower extremities at an output of less than 2.5 times that of the sensory threshold, or a maximum of 2.0 V. Lesioning Technique Parameters: Standard Radiofrequency settings. (Not bipolar or pulsed.) Temperature Settings: 80 degrees C Lesioning time: 60 seconds Intra-operative Compliance: Compliant Materials & Medications: Needle(s) (Electrode/Cannula) Type: Teflon-coated, curved tip, Radiofrequency needle(s) Gauge: 22G Length: 10cm Numbing solution: 5 cc solution of 4 cc of 0.2% Ropivacaine and 1 cc of Decadron 10mg /cc, 1-1.5 cc injected at each level above prior to ablation. The unused portion of the solution was discarded in the proper designated containers.  Once the entire procedure was completed, the treated area was cleaned, making sure to leave some of the prepping solution back to take advantage of its long term bactericidal properties.  Illustration of the posterior view of the lumbar spine and the posterior neural  structures. Laminae of L2 through S1 are labeled. DPRL5, dorsal primary ramus of L5; DPRS1, dorsal primary ramus of S1; DPR3, dorsal primary ramus of L3; FJ, facet (zygapophyseal) joint L3-L4; I, inferior articular process of L4; LB1, lateral branch of dorsal primary ramus of L1; IAB, inferior articular branches from L3 medial branch (supplies L4-L5 facet joint); IBP, intermediate branch plexus; MB3, medial branch of dorsal primary ramus of L3; NR3, third lumbar nerve root; S, superior articular process of L5; SAB, superior articular branches from L4 (supplies L4-5 facet joint also); TP3, transverse process of L3.  Vitals:   07/30/21 1036 07/30/21 1040 07/30/21 1045 07/30/21 1051  BP: (!) 154/78 (!) 161/84 (!) 154/80 (!) 159/79  Pulse:      Resp: 11 16 14 13   Temp:      TempSrc:      SpO2: 96% 96% 97% 95%  Weight:      Height:        Start Time: 1029 hrs. End Time: 1049 hrs.  Imaging Guidance (Spinal):  Type of Imaging Technique: Fluoroscopy Guidance (Spinal) Indication(s): Assistance in needle guidance and placement for procedures requiring needle placement in or near specific anatomical locations not easily accessible without such assistance. Exposure Time: Please see nurses notes. Contrast: None used. Fluoroscopic Guidance: I was personally present during the use of fluoroscopy. "Tunnel Vision Technique" used to obtain the best possible view of the target area. Parallax error corrected before commencing the procedure. "Direction-depth-direction" technique used to introduce the needle under continuous pulsed fluoroscopy. Once target was reached, antero-posterior, oblique, and lateral fluoroscopic projection used confirm needle placement in all planes. Images permanently stored in EMR. Interpretation: No contrast injected. I personally interpreted the imaging intraoperatively. Adequate needle placement confirmed in multiple planes. Permanent images saved into the patient's  record.   Post-operative Assessment:  Post-procedure Vital Signs:  Pulse/HCG Rate: 8283 Temp: (!) 96.8 F (36 C) Resp: 13 BP: (!) 159/79 SpO2: 95 %  EBL: None  Complications: No immediate post-treatment complications observed by team, or reported by patient.  Note: The patient tolerated the entire procedure well. A repeat set of vitals were taken after the procedure and the patient was kept under observation following institutional policy, for this type of procedure. Post-procedural neurological assessment was performed, showing return to baseline, prior to  discharge. The patient was provided with post-procedure discharge instructions, including a section on how to identify potential problems. Should any problems arise concerning this procedure, the patient was given instructions to immediately contact us, at any time, without hesitation. In any case, we plan to contact the patient by telephone for a follow-up status report regarding this interventional procedure.  Comments:  No additional relevant information. 5 out of 5 strength bilateral lower extremity: Plantar flexion, dorsiflexion, knee flexion, knee extension.  Plan of Care   Imaging Orders         DG PAIN CLINIC C-ARM 1-60 MIN NO REPORT    Procedure Orders    No procedure(s) ordered today    Medications ordered for procedure: Meds ordered this encounter  Medications   lidocaine (XYLOCAINE) 2 % (with pres) injection 400 mg   diazepam (VALIUM) tablet 5 mg    Make sure Flumazenil is available in the pyxis when using this medication. If oversedation occurs, administer 0.2 mg IV over 15 sec. If after 45 sec no response, administer 0.2 mg again over 1 min; may repeat at 1 min intervals; not to exceed 4 doses (1 mg)   dexamethasone (DECADRON) injection 10 mg   ropivacaine (PF) 2 mg/mL (0.2%) (NAROPIN) injection 9 mL   Medications administered: We administered lidocaine, diazepam, dexamethasone, and ropivacaine (PF) 2 mg/mL  (0.2%).  See the medical record for exact dosing, route, and time of administration.   Disposition: Discharge home  Discharge Date & Time: 07/30/2021; 1100 hrs.   Physician-requested Follow-up: Return in about 2 weeks (around 08/13/2021) for Left L3, 4, 5 RFA, in clinic (PO Valium).  Future Appointments  Date Time Provider Department Center  08/13/2021 10:00 AM Edward Jolly, MD [facility] None  09/23/2021 11:20 AM Edward Jolly, MD [facility] None   Primary Care Physician: Lauro Regulus, MD Location: [facility] Note by: Edward Jolly, MD Date: 07/30/2021; Time: 11:44 AM  Disclaimer:  Medicine is not an exact science. The only guarantee in medicine is that nothing is guaranteed. It is important to note that the decision to proceed with this intervention was based on the information collected from the patient. The Data and conclusions were drawn from the patient's questionnaire, the interview, and the physical examination. Because the information was provided in large part by the patient, it cannot be guaranteed that it has not been purposely or unconsciously manipulated. Every effort has been made to obtain as much relevant data as possible for this evaluation. It is important to note that the conclusions that lead to this procedure are derived in large part from the available data. Always take into account that the treatment will also be dependent on availability of resources and existing treatment guidelines, considered by other Pain Management Practitioners as being common knowledge and practice, at the time of the intervention. For Medico-Legal purposes, it is also important to point out that variation in procedural techniques and pharmacological choices are the acceptable norm. The indications, contraindications, technique, and results of the above procedure should only be interpreted and judged by a Board-Certified Interventional Pain Specialist with extensive  familiarity and expertise in the same exact procedure and technique.

## 2021-07-30 NOTE — Progress Notes (Signed)
Safety precautions to be maintained throughout the outpatient stay will include: orient to surroundings, keep bed in low position, maintain call bell within reach at all times, provide assistance with transfer out of bed and ambulation.  

## 2021-07-31 ENCOUNTER — Telehealth: Payer: Self-pay | Admitting: *Deleted

## 2021-07-31 NOTE — Telephone Encounter (Signed)
Post procedure call:   no  questions or concerns.  

## 2021-08-13 ENCOUNTER — Ambulatory Visit (HOSPITAL_BASED_OUTPATIENT_CLINIC_OR_DEPARTMENT_OTHER): Payer: Medicare Other | Admitting: Student in an Organized Health Care Education/Training Program

## 2021-08-13 ENCOUNTER — Other Ambulatory Visit: Payer: Self-pay

## 2021-08-13 ENCOUNTER — Ambulatory Visit
Admission: RE | Admit: 2021-08-13 | Discharge: 2021-08-13 | Disposition: A | Discharge status: Home / Self Care | Payer: Medicare Other | Source: Ambulatory Visit | Attending: Student in an Organized Health Care Education/Training Program | Admitting: Student in an Organized Health Care Education/Training Program

## 2021-08-13 ENCOUNTER — Encounter: Payer: Self-pay | Admitting: Student in an Organized Health Care Education/Training Program

## 2021-08-13 VITALS — BP 154/91 | HR 76 | Temp 97.1°F | Resp 15 | Ht 66.0 in | Wt 268.0 lb

## 2021-08-13 DIAGNOSIS — M5136 Other intervertebral disc degeneration, lumbar region: Secondary | ICD-10-CM | POA: Insufficient documentation

## 2021-08-13 DIAGNOSIS — G894 Chronic pain syndrome: Secondary | ICD-10-CM | POA: Insufficient documentation

## 2021-08-13 DIAGNOSIS — M47816 Spondylosis without myelopathy or radiculopathy, lumbar region: Secondary | ICD-10-CM | POA: Diagnosis present

## 2021-08-13 MED ORDER — DIAZEPAM 5 MG PO TABS
5.0000 mg | ORAL_TABLET | ORAL | Status: AC
  Administered 2021-08-13: 5 mg via ORAL

## 2021-08-13 MED ORDER — ROPIVACAINE HCL 2 MG/ML IJ SOLN
INTRAMUSCULAR | Status: AC
  Filled 2021-08-13: qty 20

## 2021-08-13 MED ORDER — LIDOCAINE HCL 2 % IJ SOLN
20.0000 mL | Freq: Once | INTRAMUSCULAR | Status: AC
  Administered 2021-08-13: 400 mg

## 2021-08-13 MED ORDER — ROPIVACAINE HCL 2 MG/ML IJ SOLN
9.0000 mL | Freq: Once | INTRAMUSCULAR | Status: AC
  Administered 2021-08-13: 9 mL via PERINEURAL

## 2021-08-13 MED ORDER — DEXAMETHASONE SODIUM PHOSPHATE 10 MG/ML IJ SOLN
INTRAMUSCULAR | Status: AC
  Filled 2021-08-13: qty 1

## 2021-08-13 MED ORDER — DEXAMETHASONE SODIUM PHOSPHATE 10 MG/ML IJ SOLN
10.0000 mg | Freq: Once | INTRAMUSCULAR | Status: AC
  Administered 2021-08-13: 10 mg

## 2021-08-13 MED ORDER — DIAZEPAM 5 MG PO TABS
ORAL_TABLET | ORAL | Status: AC
Start: 2021-08-13 — End: ?
  Filled 2021-08-13: qty 1

## 2021-08-13 MED ORDER — LIDOCAINE HCL 2 % IJ SOLN
INTRAMUSCULAR | Status: AC
Start: 2021-08-13 — End: ?
  Filled 2021-08-13: qty 20

## 2021-08-13 NOTE — Progress Notes (Signed)
Patient's Name: [name]  MRN: [id number]  Referring Provider: Lauro Regulus, MD  DOB: Nov 24, 1945  PCP: Lauro Regulus, MD  DOS: 08/13/2021  Note by: Edward Jolly, MD  Service setting: Ambulatory outpatient  Specialty: Interventional Pain Management  Patient type: Established  Location: [facility] (AMB) Pain Management Facility  Visit type: Interventional Procedure   Primary Reason for Visit: Interventional Pain Management Treatment. CC: Back Pain (Low left)   Procedure:       Anesthesia, Analgesia, Anxiolysis:  Type: Thermal Lumbar Facet, Medial Branch Radiofrequency Ablation/Neurotomy #7 Level:L3, L4, L5,  Medial Branch Level(s). These levels will denervate the L3-4, and the L4-5 lumbar facet joints. Primary Purpose: Therapeutic Region: Posterolateral Lumbosacral Spine Laterality: Left  (#1 done 12/01/2017 left L3, L4, L5 RFA, #2 done 12/14/2018, #3 done 06/21/19, #4 12/04/2019, #5 08/14/20, #6 02/19/21 )   Type: Local Anesthesia with PO Valium Indication(s): Analgesia and Anxiety  Local Anesthetic: Lidocaine 1%   Indications: 1. Lumbar facet arthropathy   2. Lumbar spondylosis   3. Lumbar degenerative disc disease   4. Chronic pain syndrome      [name] has been dealing with the above chronic pain for longer than three months and has either failed to respond, was unable to tolerate, or simply did not get enough benefit from other more conservative therapies including, but not limited to: 1. Over-the-counter medications 2. Anti-inflammatory medications 3. Muscle relaxants 4. Membrane stabilizers 5. Opioids 6. Physical therapy 7. Modalities (Heat, ice, etc.) 8. Invasive techniques such as nerve blocks. [name] has attained more than 50% relief of the pain from a series of diagnostic injections conducted in separate occasions.  Pain Score: Pre-procedure: 3 /10 Post-procedure: 0-No pain/10  Pre-op Assessment:  Ms.  Anastaisa [name] is a 76 y.o. (year old), female patient, seen today for interventional treatment. She  has a past surgical history that includes Ankle reconstruction (Left); Brachioplasty; Cataract extraction; Cholecystectomy; Soft Tissue Tumor Resection; Abdominal hysterectomy (07/12/2014); Joint replacement (Bilateral, 07/2014, 03/2015); nasal reconstrucion; Toe Surgery (Right); Shoulder open rotator cuff repair; Colonoscopy with propofol (N/A, 12/07/2016); Reduction mammaplasty (Bilateral); and Breast reduction surgery (Bilateral, 2020). [name] has a current medication list which includes the following prescription(s): acetaminophen, albuterol, amantadine, amlodipine, aripiprazole, vitamin c, aspirin ec, azelastine, baclofen, biotin, carvedilol, celecoxib, vitamin d3, donepezil, esomeprazole, fluticasone, fluticasone furoate-vilanterol, folic acid, guaifenesin-dextromethorphan, levothyroxine, losartan, magnesium, melatonin, montelukast, multivitamin, nystatin, onabotulinumtoxina, potassium chloride sa, rybelsus, tapentadol, [START ON 09/02/2021] tapentadol, topiramate, torsemide, trazodone, venlafaxine hcl, zinc gluconate, and potassium chloride. Her primarily concern today is the Back Pain (Low left)   Initial Vital Signs:  Pulse/HCG Rate: 84  Temp: (!) 97 F (36.1 C) Resp: 20 BP: 137/68 SpO2: 93 %  BMI: Estimated body mass index is 43.26 kg/m as calculated from the following:   Height as of this encounter: 5\' 6"  (1.676 m).   Weight as of this encounter: 268 lb (121.6 kg).  Risk Assessment: Allergies: Reviewed. She is allergic to codeine, povidone iodine, hydromorphone, latex, oxycodone-acetaminophen, sulfa antibiotics, bupropion, gabapentin, hydrocodone, morphine and related, and procaine.  Allergy Precautions: None required Coagulopathies: Reviewed. None identified.  Blood-thinner therapy: None at this time Active Infection(s): Reviewed. None identified. [name] is afebrile  Site Confirmation: [name] was asked to confirm the procedure and laterality before marking the site Procedure checklist: Completed Consent: Before the procedure and under the influence of no sedative(s), amnesic(s), or anxiolytics, the patient was informed of the treatment options, risks and  possible complications. To fulfill our ethical and legal obligations, as recommended by the American Medical Association's Code of Ethics, I have informed the patient of my clinical impression; the nature and purpose of the treatment or procedure; the risks, benefits, and possible complications of the intervention; the alternatives, including doing nothing; the risk(s) and benefit(s) of the alternative treatment(s) or procedure(s); and the risk(s) and benefit(s) of doing nothing. The patient was provided information about the general risks and possible complications associated with the procedure. These may include, but are not limited to: failure to achieve desired goals, infection, bleeding, organ or nerve damage, allergic reactions, paralysis, and death. In addition, the patient was informed of those risks and complications associated to Spine-related procedures, such as failure to decrease pain; infection (i.e.: Meningitis, epidural or intraspinal abscess); bleeding (i.e.: epidural hematoma, subarachnoid hemorrhage, or any other type of intraspinal or peri-dural bleeding); organ or nerve damage (i.e.: Any type of peripheral nerve, nerve root, or spinal cord injury) with subsequent damage to sensory, motor, and/or autonomic systems, resulting in permanent pain, numbness, and/or weakness of one or several areas of the body; allergic reactions; (i.e.: anaphylactic reaction); and/or death. Furthermore, the patient was informed of those risks and complications associated with the medications. These include, but are not limited to: allergic reactions (i.e.: anaphylactic  or anaphylactoid reaction(s)); adrenal axis suppression; blood sugar elevation that in diabetics may result in ketoacidosis or comma; water retention that in patients with history of congestive heart failure may result in shortness of breath, pulmonary edema, and decompensation with resultant heart failure; weight gain; swelling or edema; medication-induced neural toxicity; particulate matter embolism and blood vessel occlusion with resultant organ, and/or nervous system infarction; and/or aseptic necrosis of one or more joints. Finally, the patient was informed that Medicine is not an exact science; therefore, there is also the possibility of unforeseen or unpredictable risks and/or possible complications that may result in a catastrophic outcome. The patient indicated having understood very clearly. We have given the patient no guarantees and we have made no promises. Enough time was given to the patient to ask questions, all of which were answered to the patient's satisfaction. [name] has indicated that she wanted to continue with the procedure. Attestation: I, the ordering provider, attest that I have discussed with the patient the benefits, risks, side-effects, alternatives, likelihood of achieving goals, and potential problems during recovery for the procedure that I have provided informed consent. Date   Time: 08/13/2021  9:58 AM  Pre-Procedure Preparation:  Monitoring: As per clinic protocol. Respiration, ETCO2, SpO2, BP, heart rate and rhythm monitor placed and checked for adequate function Safety Precautions: Patient was assessed for positional comfort and pressure points before starting the procedure. Time-out: I initiated and conducted the "Time-out" before starting the procedure, as per protocol. The patient was asked to participate by confirming the accuracy of the "Time Out" information. Verification of the correct person, site, and procedure were performed and confirmed by  me, the nursing staff, and the patient. "Time-out" conducted as per Joint Commission's Universal Protocol (UP.01.01.01). Time: 1029  Description of Procedure:       Position: Prone Laterality: Left Levels:   L3, L4, L5,  Medial Branch Level(s), at the L3-4 and the L4-5 lumbar facet joints. Area Prepped: Lumbosacral Prepping solution: ChloraPrep (2% chlorhexidine gluconate and 70% isopropyl alcohol) Safety Precautions: Aspiration looking for blood return was conducted prior to all injections. At no point did we inject any substances, as a needle was being advanced.  Before injecting, the patient was told to immediately notify me if she was experiencing any new onset of "ringing in the ears, or metallic taste in the mouth". No attempts were made at seeking any paresthesias. Safe injection practices and needle disposal techniques used. Medications properly checked for expiration dates. SDV (single dose vial) medications used. After the completion of the procedure, all disposable equipment used was discarded in the proper designated medical waste containers. Local Anesthesia: Protocol guidelines were followed. The patient was positioned over the fluoroscopy table. The area was prepped in the usual manner. The time-out was completed. The target area was identified using fluoroscopy. A 12-in long, straight, sterile hemostat was used with fluoroscopic guidance to locate the targets for each level blocked. Once located, the skin was marked with an approved surgical skin marker. Once all sites were marked, the skin (epidermis, dermis, and hypodermis), as well as deeper tissues (fat, connective tissue and muscle) were infiltrated with a small amount of a short-acting local anesthetic, loaded on a 10cc syringe with a 25G, 1.5-in  Needle. An appropriate amount of time was allowed for local anesthetics to take effect before proceeding to the next step. Local Anesthetic: Lidocaine 1.0% The unused portion of the local  anesthetic was discarded in the proper designated containers. Technical explanation of process:  Radiofrequency Ablation (RFA)  L3 Medial Branch Nerve RFA: The target area for the L3 medial branch is at the junction of the postero-lateral aspect of the superior articular process and the superior, posterior, and medial edge of the transverse process of L4. Under fluoroscopic guidance, a Radiofrequency needle was inserted until contact was made with os over the superior postero-lateral aspect of the pedicular shadow (target area). Sensory and motor testing was conducted to properly adjust the position of the needle. Once satisfactory placement of the needle was achieved, the numbing solution was slowly injected after negative aspiration for blood. 1 mL of the nerve block solution was injected without difficulty or complication. After waiting for at least 3 minutes, the ablation was performed. Once completed, the needle was removed intact. L4 Medial Branch Nerve RFA: The target area for the L4 medial branch is at the junction of the postero-lateral aspect of the superior articular process and the superior, posterior, and medial edge of the transverse process of L5. Under fluoroscopic guidance, a Radiofrequency needle was inserted until contact was made with os over the superior postero-lateral aspect of the pedicular shadow (target area). Sensory and motor testing was conducted to properly adjust the position of the needle. Once satisfactory placement of the needle was achieved, the numbing solution was slowly injected after negative aspiration for blood. 1mL of the nerve block solution was injected without difficulty or complication. After waiting for at least 3 minutes, the ablation was performed. Once completed, the needle was removed intact. L5 Medial Branch Nerve RFA: The target area for the L5 medial branch is at the junction of the postero-lateral aspect of the superior articular process of S1 and the  superior, posterior, and medial edge of the sacral ala. Under fluoroscopic guidance, a Radiofrequency needle was inserted until contact was made with os over the superior postero-lateral aspect of the pedicular shadow (target area). Sensory and motor testing was conducted to properly adjust the position of the needle. Once satisfactory placement of the needle was achieved, the numbing solution was slowly injected after negative aspiration for blood. 1mL of the nerve block solution was injected without difficulty or complication. After waiting for at least 3  minutes, the ablation was performed. Once completed, the needle was removed intact.  Radiofrequency lesioning (ablation):  Radiofrequency Generator: NeuroTherm [id number] Sensory Stimulation Parameters: 50 Hz was used to locate & identify the nerve, making sure that the needle was positioned such that there was no sensory stimulation below 0.3 V or above 0.7 V. Motor Stimulation Parameters: 2 Hz was used to evaluate the motor component. Care was taken not to lesion any nerves that demonstrated motor stimulation of the lower extremities at an output of less than 2.5 times that of the sensory threshold, or a maximum of 2.0 V. Lesioning Technique Parameters: Standard Radiofrequency settings. (Not bipolar or pulsed.) Temperature Settings: 80 degrees C Lesioning time: 60 seconds Intra-operative Compliance: Compliant Materials & Medications: Needle(s) (Electrode/Cannula) Type: Teflon-coated, curved tip, Radiofrequency needle(s) Gauge: 22G Length: 10cm Numbing solution: 5 cc solution of 4 cc of 0.2% Ropivacaine and 1 cc of Decadron 10mg /cc, 1-1.5 cc injected at each level above prior to ablation. The unused portion of the solution was discarded in the proper designated containers.  Once the entire procedure was completed, the treated area was cleaned, making sure to leave some of the prepping solution back to take advantage of its long term bactericidal  properties.  Illustration of the posterior view of the lumbar spine and the posterior neural structures. Laminae of L2 through S1 are labeled. DPRL5, dorsal primary ramus of L5; DPRS1, dorsal primary ramus of S1; DPR3, dorsal primary ramus of L3; FJ, facet (zygapophyseal) joint L3-L4; I, inferior articular process of L4; LB1, lateral branch of dorsal primary ramus of L1; IAB, inferior articular branches from L3 medial branch (supplies L4-L5 facet joint); IBP, intermediate branch plexus; MB3, medial branch of dorsal primary ramus of L3; NR3, third lumbar nerve root; S, superior articular process of L5; SAB, superior articular branches from L4 (supplies L4-5 facet joint also); TP3, transverse process of L3.  Vitals:   08/13/21 1035 08/13/21 1040 08/13/21 1045 08/13/21 1050  BP: (!) 149/91 (!) 150/83 (!) 153/92 (!) 154/91  Pulse: 78 76 77 76  Resp: 10 12 13 15   Temp:      SpO2: 100% 99% 100% 99%  Weight:      Height:        Start Time: 1029 hrs. End Time: 1047 hrs.  Imaging Guidance (Spinal):  Type of Imaging Technique: Fluoroscopy Guidance (Spinal) Indication(s): Assistance in needle guidance and placement for procedures requiring needle placement in or near specific anatomical locations not easily accessible without such assistance. Exposure Time: Please see nurses notes. Contrast: None used. Fluoroscopic Guidance: I was personally present during the use of fluoroscopy. "Tunnel Vision Technique" used to obtain the best possible view of the target area. Parallax error corrected before commencing the procedure. "Direction-depth-direction" technique used to introduce the needle under continuous pulsed fluoroscopy. Once target was reached, antero-posterior, oblique, and lateral fluoroscopic projection used confirm needle placement in all planes. Images permanently stored in EMR. Interpretation: No contrast injected. I personally interpreted the imaging intraoperatively. Adequate needle placement  confirmed in multiple planes. Permanent images saved into the patient's record.   Post-operative Assessment:  Post-procedure Vital Signs:  Pulse/HCG Rate: 76 (NSR)  Temp: (!) 97.1 F (36.2 C) Resp: 15 BP: (!) 154/91 SpO2: 99 %  EBL: None  Complications: No immediate post-treatment complications observed by team, or reported by patient.  Note: The patient tolerated the entire procedure well. A repeat set of vitals were taken after the procedure and the patient was kept under observation following institutional policy,  for this type of procedure. Post-procedural neurological assessment was performed, showing return to baseline, prior to discharge. The patient was provided with post-procedure discharge instructions, including a section on how to identify potential problems. Should any problems arise concerning this procedure, the patient was given instructions to immediately contact us, at any time, without hesitation. In any case, we plan to contact the patient by telephone for a follow-up status report regarding this interventional procedure.  Comments:  No additional relevant information. 5 out of 5 strength bilateral lower extremity: Plantar flexion, dorsiflexion, knee flexion, knee extension.  Plan of Care   Imaging Orders         DG PAIN [facility] C-ARM 1-60 MIN NO REPORT     Procedure Orders    No procedure(s) ordered today      Medications ordered for procedure: Meds ordered this encounter  Medications   lidocaine (XYLOCAINE) 2 % (with pres) injection 400 mg   diazepam (VALIUM) tablet 5 mg    Make sure Flumazenil is available in the pyxis when using this medication. If oversedation occurs, administer 0.2 mg IV over 15 sec. If after 45 sec no response, administer 0.2 mg again over 1 min; may repeat at 1 min intervals; not to exceed 4 doses (1 mg)   dexamethasone (DECADRON) injection 10 mg   ropivacaine (PF) 2 mg/mL (0.2%) (NAROPIN) injection 9 mL   Medications  administered: We administered lidocaine, diazepam, dexamethasone, and ropivacaine (PF) 2 mg/mL (0.2%).  See the medical record for exact dosing, route, and time of administration.   Disposition: Discharge home  Discharge Date & Time: 08/13/2021; 1558 hrs.   Physician-requested Follow-up: Return for Keep sch. appt.  Future Appointments  Date Time Provider Department Center  09/23/2021 11:20 AM Edward Jolly, MD [facility] None   Primary Care Physician: Lauro Regulus, MD Location: [facility] Note by: Edward Jolly, MD Date: 08/13/2021; Time: 11:35 AM  Disclaimer:  Medicine is not an exact science. The only guarantee in medicine is that nothing is guaranteed. It is important to note that the decision to proceed with this intervention was based on the information collected from the patient. The Data and conclusions were drawn from the patient's questionnaire, the interview, and the physical examination. Because the information was provided in large part by the patient, it cannot be guaranteed that it has not been purposely or unconsciously manipulated. Every effort has been made to obtain as much relevant data as possible for this evaluation. It is important to note that the conclusions that lead to this procedure are derived in large part from the available data. Always take into account that the treatment will also be dependent on availability of resources and existing treatment guidelines, considered by other Pain Management Practitioners as being common knowledge and practice, at the time of the intervention. For Medico-Legal purposes, it is also important to point out that variation in procedural techniques and pharmacological choices are the acceptable norm. The indications, contraindications, technique, and results of the above procedure should only be interpreted and judged by a Board-Certified Interventional Pain Specialist with extensive familiarity and  expertise in the same exact procedure and technique.

## 2021-08-13 NOTE — Patient Instructions (Signed)

## 2021-08-13 NOTE — Progress Notes (Signed)
Safety precautions to be maintained throughout the outpatient stay will include: orient to surroundings, keep bed in low position, maintain call bell within reach at all times, provide assistance with transfer out of bed and ambulation.  

## 2021-08-14 ENCOUNTER — Telehealth: Payer: Self-pay | Admitting: *Deleted

## 2021-08-14 NOTE — Telephone Encounter (Signed)
No problems post procedure. 

## 2021-09-23 ENCOUNTER — Encounter: Payer: Self-pay | Admitting: Student in an Organized Health Care Education/Training Program

## 2021-09-23 ENCOUNTER — Ambulatory Visit
Payer: Medicare Other | Attending: Student in an Organized Health Care Education/Training Program | Admitting: Student in an Organized Health Care Education/Training Program

## 2021-09-23 VITALS — BP 145/88 | HR 83 | Temp 97.2°F | Resp 16 | Ht 66.0 in | Wt 262.0 lb

## 2021-09-23 DIAGNOSIS — M5136 Other intervertebral disc degeneration, lumbar region: Secondary | ICD-10-CM | POA: Insufficient documentation

## 2021-09-23 DIAGNOSIS — M47816 Spondylosis without myelopathy or radiculopathy, lumbar region: Secondary | ICD-10-CM | POA: Insufficient documentation

## 2021-09-23 DIAGNOSIS — M62830 Muscle spasm of back: Secondary | ICD-10-CM | POA: Diagnosis present

## 2021-09-23 DIAGNOSIS — G894 Chronic pain syndrome: Secondary | ICD-10-CM | POA: Diagnosis present

## 2021-09-23 MED ORDER — TRAMADOL HCL 50 MG PO TABS: 50.0000 mg | ORAL_TABLET | Freq: Two times a day (BID) | ORAL | 2 refills | Status: DC | PRN

## 2021-09-23 NOTE — Progress Notes (Signed)
PROVIDER NOTE: Information contained herein reflects review and annotations entered in association with encounter. Interpretation of such information and data should be left to medically-trained personnel. Information provided to patient can be located elsewhere in the medical record under "Patient Instructions". Document created using STT-dictation technology, any transcriptional errors that may result from process are unintentional.  ?  ?Patient: [name]  Service Category: E/M  Provider: Edward Jolly, MD  ?DOB: 01-29-1946  DOS: 09/23/2021  Specialty: Interventional Pain Management  ?MRN: [id number]  Setting: Ambulatory outpatient  PCP: Lauro Regulus, MD  ?Type: Established Patient    Referring Provider: Lauro Regulus, MD  ?Location: Office  Delivery: Face-to-face    ? ?HPI  ?[name], a 76 y.o. year old female, is here today because of her Lumbar facet arthropathy [M47.816]. [name]'s primary complain today is Back Pain (lower) ? ?Last encounter: My last encounter with her was on 08/13/21 ?Pertinent problems: [name] has Lumbar spondylosis; Lumbar facet arthropathy; Lumbar degenerative disc disease; Spasm of lumbar paraspinous muscle; and Chronic pain syndrome on their pertinent problem list. ?Pain Assessment: Severity of Chronic pain is reported as a 1 /10. Location: Back Lower/denies. Onset: More than a month ago. Quality: Constant, Pressure. Timing: Constant. Modifying factor(s): sitting/lying down, Tylenol, Nucynta. ?Vitals:  height is 5\' 6"  (1.676 m) and weight is 262 lb (118.8 kg). Her temporal temperature is 97.2 ?F (36.2 ?C) (abnormal). Her blood pressure is 145/88 (abnormal) and her pulse is 83. Her respiration is 16 and oxygen saturation is 99%.  ? ?Reason for encounter: both, medication management and post-procedure assessment.   ? ?Courtnie presents today for medication management and postprocedural evaluation after  her lumbar radiofrequency ablation bilaterally at L3, L4, L5.  Her left side was done on 08/13/2021, her right side was done 07/30/2021.  She is experiencing approximately 90% pain relief in her lower back and is pleased with results. ? ?Of note she had a hip cortisone injection done last week at her primary care provider's office. ? ?She is stating that Nucynta is too expensive for her to afford.  We will transition her to tramadol as below. ? ?Post-procedure evaluation  ? Left L3, L4, L5 RFA 08/13/2021, right L3, L4, L5 RFA 07/30/2021    ?Effectiveness:  ?Initial hour after procedure: 100 %  ?Subsequent 4-6 hours post-procedure: 100 %  ?Analgesia past initial 6 hours: 90 %  ?Ongoing improvement:  ?Analgesic:  80-90% ?Function: Ms. 08/01/2021 [name] reports improvement in function ?ROM: [name] reports improvement in ROM ? ? ?Pharmacotherapy Assessment  ? ?Analgesic: Transition to tramadol as below. ? ?Monitoring: ?[place] PMP: PDMP reviewed during this encounter.       ?Pharmacotherapy: No side-effects or adverse reactions reported. ?Compliance: No problems identified. ?Effectiveness: Clinically acceptable. ? ?[name]  09/23/2021 10:44 AM  Sign when Signing Visit ?Nursing Pain Medication Assessment:  ?Safety precautions to be maintained throughout the outpatient stay will include: orient to surroundings, keep bed in low position, maintain call bell within reach at all times, provide assistance with transfer out of bed and ambulation.  ?Medication Inspection Compliance: Pill count conducted under aseptic conditions, in front of the patient. Neither the pills nor the bottle was removed from the patient's sight at any time. Once count was completed pills were immediately returned to the patient in their original bottle. ? ?Medication: See above ?Pill/Patch Count:  11 of 30 pills remain ?Pill/Patch Appearance: Markings consistent with prescribed medication ?  Bottle Appearance: Standard pharmacy  container. Clearly labeled. ?Filled Date: 03 / 21 / 2023 ?Last Medication intake:  TodaySafety precautions to be maintained throughout the outpatient stay will include: orient to surroundings, keep bed in low position, maintain call bell within reach at all times, provide assistance with transfer out of bed and ambulation.  ?  ?  UDS:  ?Summary  ?Date Value Ref Range Status  ?08/01/2020 Note  Final  ?  Comment:  ?  ==================================================================== ?[employer] Select 13 (MW) ?==================================================================== ?Test                             Result       Flag       Units ? ?Drug Present and Declared for Prescription Verification ?  Tapentadol                     5398         EXPECTED   ng/mg creat ?   Source of tapentadol is a scheduled prescription medication. ? ?==================================================================== ?Test                      Result    Flag   Units      Ref Range ?  Creatinine              158              mg/dL      >=28>=20 ?==================================================================== ?Declared Medications: ? The flagging and interpretation on this report are based on the ? following declared medications.  Unexpected results may arise from ? inaccuracies in the declared medications. ? ? **Note: The testing scope of this panel includes these medications: ? ? Tapentadol (Nucynta) ? ? **Note: The testing scope of this panel does not include the ? following reported medications: ? ? Acetaminophen (Tylenol) ? Albuterol ? Amantadine ? Amlodipine (Norvasc) ? Aripiprazole (Abilify) ? Aspirin ? Azelastine (Astelin) ? Baclofen (Lioresal) ? Biotin ? Carvedilol (Coreg) ? Celecoxib (Celebrex) ? Donepezil (Aricept) ? Esomeprazole (Nexium) ? Fluticasone (Breo) ? Fluticasone (Flonase) ? Folic Acid ? Guaifenesin (Robitussin) ? Levothyroxine (Synthroid) ? Losartan (Cozaar) ? Magnesium ? Melatonin ? Montelukast (Singulair) ?  Multivitamin ? Nystatin (Mycostatin) ? Potassium (Klor-Con) ? Topiramate (Topamax) ? Torsemide (Demadex) ? Trazodone (Desyrel) ? Undefined Miscellaneous Drug ? Venlafaxine ? Vilanterol Virgel Bouquet(Breo) ? Vitamin C ? Vitamin D3 ? Zinc ?==================================================================== ?For clinical consultation, please call [phone]. ?==================================================================== ?  ?  ? ?ROS  ?Constitutional: Denies any fever or chills ?Gastrointestinal: No reported hemesis, hematochezia, vomiting, or acute GI distress ?Musculoskeletal:  Low back pain related to lumbar facet arthropathy ?Neurological: No reported episodes of acute onset apraxia, aphasia, dysarthria, agnosia, amnesia, paralysis, loss of coordination, or loss of consciousness ? ?Medication Review  ?ARIPiprazole, Biotin, OnabotulinumtoxinA, Semaglutide, Venlafaxine HCl, Vitamin D3, acetaminophen, albuterol, amLODipine, amantadine, aspirin EC, azelastine, baclofen, carvedilol, celecoxib, donepezil, esomeprazole, fluticasone, fluticasone furoate-vilanterol, folic acid, guaiFENesin-dextromethorphan, levothyroxine, losartan, magnesium, melatonin, montelukast, multivitamin, nystatin, potassium chloride, potassium chloride SA, tapentadol, topiramate, torsemide, traMADol, traZODone, vitamin C, and zinc gluconate ? ?History Review  ?Allergy: [name] is allergic to codeine, povidone iodine, hydromorphone, latex, oxycodone-acetaminophen, sulfa antibiotics, bupropion, gabapentin, hydrocodone, morphine and related, and procaine. ?Drug: [name]  reports no history of drug use. ?Alcohol:  reports current alcohol use of about 2.0 standard drinks per week. ?Tobacco:  reports that she has never smoked. She has never used  smokeless tobacco. ?Social: [name]  reports that she has never smoked. She has never used smokeless tobacco. She reports current alcohol use of about 2.0  standard drinks per week. She reports that she does not use drugs. ?Medical:  has a past medical history of Benign positional vertigo (03/04/2016), Brain injury (HCC), Climacteric, Depression, DJD (degenerat

## 2021-09-23 NOTE — Progress Notes (Signed)
Nursing Pain Medication Assessment:  ?Safety precautions to be maintained throughout the outpatient stay will include: orient to surroundings, keep bed in low position, maintain call bell within reach at all times, provide assistance with transfer out of bed and ambulation.  ?Medication Inspection Compliance: Pill count conducted under aseptic conditions, in front of the patient. Neither the pills nor the bottle was removed from the patient's sight at any time. Once count was completed pills were immediately returned to the patient in their original bottle. ? ?Medication: See above ?Pill/Patch Count:  11 of 30 pills remain ?Pill/Patch Appearance: Markings consistent with prescribed medication ?Bottle Appearance: Standard pharmacy container. Clearly labeled. ?Filled Date: 03 / 21 / 2023 ?Last Medication intake:  TodaySafety precautions to be maintained throughout the outpatient stay will include: orient to surroundings, keep bed in low position, maintain call bell within reach at all times, provide assistance with transfer out of bed and ambulation.  ?

## 2021-12-18 ENCOUNTER — Encounter: Payer: Self-pay | Admitting: Student in an Organized Health Care Education/Training Program

## 2021-12-18 ENCOUNTER — Ambulatory Visit
Payer: Medicare Other | Attending: Student in an Organized Health Care Education/Training Program | Admitting: Student in an Organized Health Care Education/Training Program

## 2021-12-18 VITALS — BP 106/52 | HR 81 | Temp 97.5°F | Resp 18 | Ht 66.0 in | Wt 262.0 lb

## 2021-12-18 DIAGNOSIS — M47816 Spondylosis without myelopathy or radiculopathy, lumbar region: Secondary | ICD-10-CM | POA: Insufficient documentation

## 2021-12-18 DIAGNOSIS — G894 Chronic pain syndrome: Secondary | ICD-10-CM | POA: Insufficient documentation

## 2021-12-18 DIAGNOSIS — M5136 Other intervertebral disc degeneration, lumbar region: Secondary | ICD-10-CM | POA: Insufficient documentation

## 2021-12-18 DIAGNOSIS — M62830 Muscle spasm of back: Secondary | ICD-10-CM | POA: Insufficient documentation

## 2021-12-18 MED ORDER — TRAMADOL HCL 50 MG PO TABS: 50.0000 mg | ORAL_TABLET | Freq: Two times a day (BID) | ORAL | 2 refills | Status: AC | PRN

## 2021-12-18 NOTE — Progress Notes (Signed)
PROVIDER NOTE: Information contained herein reflects review and annotations entered in association with encounter. Interpretation of such information and data should be left to medically-trained personnel. Information provided to patient can be located elsewhere in the medical record under "Patient Instructions". Document created using STT-dictation technology, any transcriptional errors that may result from process are unintentional.    Patient: [name]  Service Category: E/M  Provider: Gillis Santa, MD  DOB: 01-19-1946  DOS: 12/18/2021  Specialty: Interventional Pain Management  MRN: [id number]  Setting: Ambulatory outpatient  PCP: Robin Ruths, MD  Type: Established Patient    Referring Provider: Kirk Ruths, MD  Location: Office  Delivery: Face-to-face     HPI  [name], a 76 y.o. year old female, is here today because of her Lumbar facet arthropathy [M47.816]. [name]'s primary complain today is Back Pain (lower)  Last encounter: My last encounter with her was on 09/23/2021 Pertinent problems: [name] has Lumbar spondylosis; Lumbar facet arthropathy; Lumbar degenerative disc disease; Spasm of lumbar paraspinous muscle; and Chronic pain syndrome on their pertinent problem list. Pain Assessment: Severity of Chronic pain is reported as a 6 /10. Location: Back Lower/into hips bilat. Onset: More than a month ago. Quality: Aching. Timing: Constant. Modifying factor(s): meds. Vitals:  height is 5' 6"  (1.676 m) and weight is 262 lb (118.8 kg). Her temporal temperature is 97.5 F (36.4 C) (abnormal). Her blood pressure is 106/52 (abnormal) and her pulse is 81. Her respiration is 18 and oxygen saturation is 100%.   Reason for encounter: medication management.    Patient presents for medication management.  Unfortunately, she is obtaining suboptimal pain relief with her current dose of tramadol when compared to  previous Nucynta.  She was no longer able to afford Nucynta has been made to transition to tramadol.  She states that she has to take 2 tramadol's at a time, 100 mg to find some analgesic benefit.  We will increase her dose to 1 to 2 tablets every 12 hours as needed.  Furthermore, she is having increased lower back pain secondary to lumbar spondylosis and facet arthropathy.  Previous lumbar radiofrequency ablations were done on 07/30/2021 for the right side and 08/13/2021 for the left side.  She would like to repeat these starting with the right side first which we will plan to do that after August 15 which will be 6 months after her previous ablation..  She is still obtaining approximately 70% pain relief states that the pain is returning on a weekly basis and increasing and she would like to do the ablations especially since she is not utilizing Nucynta anymore.   Pharmacotherapy Assessment   Analgesic: Tramadol 50 to 100 mg twice daily as needed.  Monitoring: [place] PMP: PDMP reviewed during this encounter.       Pharmacotherapy: No side-effects or adverse reactions reported. Compliance: No problems identified. Effectiveness: Clinically acceptable.  Rise Patience, RN  12/18/2021  9:31 AM  Sign when Signing Visit Nursing Pain Medication Assessment:  Safety precautions to be maintained throughout the outpatient stay will include: orient to surroundings, keep bed in low position, maintain call bell within reach at all times, provide assistance with transfer out of bed and ambulation.  Medication Inspection Compliance: Pill count conducted under aseptic conditions, in front of the patient. Neither the pills nor the bottle was removed from the patient's sight at any time. Once count was completed pills were immediately returned to the patient in  their original bottle.  Medication: Tramadol (Ultram) Pill/Patch Count:  16 of 60 pills remain Pill/Patch Appearance: Markings consistent with prescribed  medication Bottle Appearance: Standard pharmacy container. Clearly labeled. Filled Date: 06 / 10 / 2023 Last Medication intake:  Today     UDS:  Summary  Date Value Ref Range Status  08/01/2020 Note  Final    Comment:    ==================================================================== Tox[employer]SU[employer] Select 13 (MW) ==================================================================== Test                             Result       Flag       Units  Drug Present and Declared for Prescription Verification   Tapentadol                     5398         EXPECTED   ng/mg creat    Source of tapentadol is a scheduled prescription medication.  ==================================================================== Test                      Result    Flag   Units      Ref Range   Creatinine              158              mg/dL      >=20 ==================================================================== Declared Medications:  The flagging and interpretation on this report are based on the  following declared medications.  Unexpected results may arise from  inaccuracies in the declared medications.   **Note: The testing scope of this panel includes these medications:   Tapentadol (Nucynta)   **Note: The testing scope of this panel does not include the  following reported medications:   Acetaminophen (Tylenol)  [name]  [name]  [name] (Norvasc)  Aripiprazole (Abilify)  Aspirin  [name] (Astelin)  Baclofen (Lioresal)  Biotin  [name] (Coreg)  Celecoxib (Celebrex)  [name] (Aricept)  Esomeprazole (Nexium)  Fluticasone (Breo)  Fluticasone (Flonase)  Folic Acid  Guaifenesin (Robitussin)  Levothyroxine (Synthroid)  Losartan (Cozaar)  Magnesium  Melatonin  Montelukast (Singulair)  Multivitamin  Nystatin (Mycostatin)  Potassium (Klor-Con)  Topiramate (Topamax)  Torsemide (Demadex)  Trazodone (Desyrel)  Undefined Miscellaneous Drug  Venlafaxine  Vilanterol  (Breo)  Vitamin C  Vitamin D3  Zinc ==================================================================== For clinical consultation, please call [phone]. ====================================================================      ROS  Constitutional: Denies any fever or chills Gastrointestinal: No reported hemesis, hematochezia, vomiting, or acute GI distress Musculoskeletal:  Low back pain related to lumbar facet arthropathy Neurological: No reported episodes of acute onset apraxia, aphasia, dysarthria, agnosia, amnesia, paralysis, loss of coordination, or loss of consciousness  Medication Review  Biotin, OnabotulinumtoxinA, Semaglutide, Venlafaxine HCl, Vitamin D3, acetaminophen, [name], [name], [name], aspirin EC, [name], baclofen, [name], celecoxib, [name], esomeprazole, fluticasone, fluticasone furoate-vilanterol, folic acid, levothyroxine, losartan, magnesium, melatonin, montelukast, multivitamin, nystatin, potassium chloride SA, topiramate, torsemide, traMADol, traZODone, vitamin C, and zinc gluconate  History Review  Allergy: [name] is allergic to codeine, povidone iodine, hydromorphone, latex, oxycodone-acetaminophen, sulfa antibiotics, bupropion, gabapentin, hydrocodone, morphine and related, and procaine. Drug: [name]  reports no history of drug use. Alcohol:  reports current alcohol use of about 2.0 standard drinks of alcohol per week. Tobacco:  reports that she has never smoked. She has never used smokeless tobacco. Social: [name]  reports that she has never smoked. She has never used  smokeless tobacco. She reports current alcohol use of about 2.0 standard drinks of alcohol per week. She reports that she does not use drugs. Medical:  has a past medical history of Benign positional vertigo (03/04/2016), Brain injury ([place]), Climacteric, Depression, DJD (degenerative joint disease), Dyslipidemia,  GERD (gastroesophageal reflux disease), Hypertension, Hypokalemia, Hypothyroidism, IBS (irritable bowel syndrome), OAB (overactive bladder), Ovarian cyst (03/25/2015), Rhinitis, Sleep apnea, and Spasm of thoracolumbar muscle. Surgical: [name]  has a past surgical history that includes Ankle reconstruction (Left); Brachioplasty; Cataract extraction; Cholecystectomy; Soft Tissue Tumor Resection; Abdominal hysterectomy (07/12/2014); Joint replacement (Bilateral, 07/2014, 03/2015); nasal reconstrucion; Toe Surgery (Right); Shoulder open rotator cuff repair; Colonoscopy with propofol (N/A, 12/07/2016); Reduction mammaplasty (Bilateral); and Breast reduction surgery (Bilateral, 2020). Family: family history includes Breast cancer in her paternal aunt; Lung cancer in her father and mother.  Laboratory Chemistry Profile   Renal Lab Results  Component Value Date   BUN 25 (H) 07/06/2020   CREATININE 0.99 07/06/2020   GFRNONAA 60 (L) 07/06/2020     Hepatic Lab Results  Component Value Date   AST 25 07/02/2020   ALT 21 07/02/2020   ALBUMIN 3.7 07/02/2020   ALKPHOS 80 07/02/2020     Electrolytes Lab Results  Component Value Date   NA 136 07/06/2020   K 4.1 07/06/2020   CL 98 07/06/2020   CALCIUM 9.0 07/06/2020   MG 2.0 07/06/2020   PHOS 3.0 07/06/2020     Bone No results found for: "VD25OH", "VD125OH2TOT", "IO9735HG9", "JM4268TM1", "25OHVITD1", "25OHVITD2", "25OHVITD3", "TESTOFREE", "TESTOSTERONE"   Inflammation (CRP: Acute Phase) (ESR: Chronic Phase) Lab Results  Component Value Date   LATICACIDVEN 1.1 07/02/2020       Note: Above Lab results reviewed.   Physical Exam  General appearance: Well nourished, well developed, and well hydrated. In no apparent acute distress Mental status: Alert, oriented x 3 (person, place, & time)       Respiratory: No evidence of acute respiratory distress Eyes: PERLA Vitals: BP (!) 106/52   Pulse 81   Temp (!) 97.5 F (36.4 C)  (Temporal)   Resp 18   Ht $R'5\' 6"'XJ$  (1.676 m)   Wt 262 lb (118.8 kg)   SpO2 100%   BMI 42.29 kg/m  BMI: Estimated body mass index is 42.29 kg/m as calculated from the following:   Height as of this encounter: $RemoveBeforeD'5\' 6"'iDCSjDHcZWJfWn$  (1.676 m).   Weight as of this encounter: 262 lb (118.8 kg). Ideal: Ideal body weight: 59.3 kg (130 lb 11.7 oz) Adjusted ideal body weight: 83.1 kg (183 lb 3.8 oz)  Positive low back pain related to lumbar facet arthropathy  right hip pain Antalgic gait, presents today in wheelchair  Assessment   Diagnosis  1. Lumbar facet arthropathy   2. Lumbar spondylosis   3. Lumbar degenerative disc disease   4. Spasm of lumbar paraspinous muscle   5. Chronic pain syndrome        Plan of Care   [name], [name], [name], [name], [name], [name], esomeprazole, levothyroxine, losartan, montelukast, potassium chloride sa, torsemide, trazodone, and venlafaxine hcl.   1.  Increase tramadol to 50 to 100 mg twice daily as needed. 3.  Repeat lumbar radiofrequency ablation as below starting with the right side.  Requested Prescriptions   Signed Prescriptions Disp Refills   traMADol (ULTRAM) 50 MG tablet 90 tablet 2    Sig: Take 1-2 tablets (50-100 mg total) by  mouth every 12 (twelve) hours as needed for severe pain.   Orders:  Orders Placed This Encounter  Procedures   Radiofrequency,Lumbar    Standing Status:   Future    Standing Expiration Date:   06/20/2022    Scheduling Instructions:     Side(s) RIGHT     Level(s):  L3, L4, L5, & Medial Branch Nerve(s)     Sedation: With Sedation     Scheduling Timeframe: As soon as pre-approved    Order Specific Question:   Where will this procedure be performed?    Answer:   [facility]   Follow-up plan:   Return in about 6 weeks (around 01/28/2022) for R L3, 4, 5 RFA , in clinic (PO Valium).      Status post left L3, L4, L5 RFA 12/01/2017, 12/14/2018, 06/21/2019.  Status post right L3, L4, L5 RFA on 12/22/2017, 12/28/2018, 06/21/2019, 07/10/2019 helps decrease her pain and improve her functional status for axial low back for approximately 6 to 8 months post RFA.  Can repeat every 6 to 12 months.  Left L4 Sprint peripheral nerve stimulation 03/13/2020, lead displacement/migration, lead removed and left L4 medial branch nerve PNS replacement on 05/15/2020, became dislodged again early February.   Right L3, L4, L5 RFA 09/30/2020, left L3, L4, L5 RFA 08/14/2020    Recent Visits Date Type Provider Dept  09/23/21 Office Visit Robin Santa, MD [facility]  Showing recent visits within past 90 days and meeting all other requirements Today's Visits Date Type Provider Dept  12/18/21 Office Visit Robin Santa, MD [facility]  Showing today's visits and meeting all other requirements Future Appointments Date Type Provider Dept  01/28/22 Appointment Robin Santa, MD [facility]  Showing future appointments within next 90 days and meeting all other requirements  I discussed the assessment and treatment plan with the patient. The patient was provided an opportunity to ask questions and all were answered. The patient agreed with the plan and demonstrated an understanding of the instructions.  Patient advised to call back or seek an in-person evaluation if the symptoms or condition worsens.  Duration of encounter:30 minutes.  Note by: Robin Santa, MD Date: 12/18/2021; Time: 10:01 AM

## 2021-12-18 NOTE — Progress Notes (Signed)
Nursing Pain Medication Assessment:  Safety precautions to be maintained throughout the outpatient stay will include: orient to surroundings, keep bed in low position, maintain call bell within reach at all times, provide assistance with transfer out of bed and ambulation.  Medication Inspection Compliance: Pill count conducted under aseptic conditions, in front of the patient. Neither the pills nor the bottle was removed from the patient's sight at any time. Once count was completed pills were immediately returned to the patient in their original bottle.  Medication: Tramadol (Ultram) Pill/Patch Count:  16 of 60 pills remain Pill/Patch Appearance: Markings consistent with prescribed medication Bottle Appearance: Standard pharmacy container. Clearly labeled. Filled Date: 06 / 10 / 2023 Last Medication intake:  Today

## 2021-12-18 NOTE — Patient Instructions (Signed)
GENERAL RISKS AND COMPLICATIONS  What are the risk, side effects and possible complications? Generally speaking, most procedures are safe.  However, with any procedure there are risks, side effects, and the possibility of complications.  The risks and complications are dependent upon the sites that are lesioned, or the type of nerve block to be performed.  The closer the procedure is to the spine, the more serious the risks are.  Great care is taken when placing the radio frequency needles, block needles or lesioning probes, but sometimes complications can occur. Infection: Any time there is an injection through the skin, there is a risk of infection.  This is why sterile conditions are used for these blocks.  There are four possible types of infection. Localized skin infection. Central Nervous System Infection-This can be in the form of Meningitis, which can be deadly. Epidural Infections-This can be in the form of an epidural abscess, which can cause pressure inside of the spine, causing compression of the spinal cord with subsequent paralysis. This would require an emergency surgery to decompress, and there are no guarantees that the patient would recover from the paralysis. Discitis-This is an infection of the intervertebral discs.  It occurs in about 1% of discography procedures.  It is difficult to treat and it may lead to surgery.        2. Pain: the needles have to go through skin and soft tissues, will cause soreness.       3. Damage to internal structures:  The nerves to be lesioned may be near blood vessels or    other nerves which can be potentially damaged.       4. Bleeding: Bleeding is more common if the patient is taking blood thinners such as  aspirin, Coumadin, Ticiid, Plavix, etc., or if he/she have some genetic predisposition  such as hemophilia. Bleeding into the spinal canal can cause compression of the spinal  cord with subsequent paralysis.  This would require an emergency  surgery to  decompress and there are no guarantees that the patient would recover from the  paralysis.       5. Pneumothorax:  Puncturing of a lung is a possibility, every time a needle is introduced in  the area of the chest or upper back.  Pneumothorax refers to free air around the  collapsed lung(s), inside of the thoracic cavity (chest cavity).  Another two possible  complications related to a similar event would include: Hemothorax and Chylothorax.   These are variations of the Pneumothorax, where instead of air around the collapsed  lung(s), you may have blood or chyle, respectively.       6. Spinal headaches: They may occur with any procedures in the area of the spine.       7. Persistent CSF (Cerebro-Spinal Fluid) leakage: This is a rare problem, but may occur  with prolonged intrathecal or epidural catheters either due to the formation of a fistulous  track or a dural tear.       8. Nerve damage: By working so close to the spinal cord, there is always a possibility of  nerve damage, which could be as serious as a permanent spinal cord injury with  paralysis.       9. Death:  Although rare, severe deadly allergic reactions known as "Anaphylactic  reaction" can occur to any of the medications used.      10. Worsening of the symptoms:  We can always make thing worse.  What are the chances   of something like this happening? Chances of any of this occuring are extremely low.  By statistics, you have more of a chance of getting killed in a motor vehicle accident: while driving to the hospital than any of the above occurring .  Nevertheless, you should be aware that they are possibilities.  In general, it is similar to taking a shower.  Everybody knows that you can slip, hit your head and get killed.  Does that mean that you should not shower again?  Nevertheless always keep in mind that statistics do not mean anything if you happen to be on the wrong side of them.  Even if a procedure has a 1 (one) in a  1,000,000 (million) chance of going wrong, it you happen to be that one..Also, keep in mind that by statistics, you have more of a chance of having something go wrong when taking medications.  Who should not have this procedure? If you are on a blood thinning medication (e.g. Coumadin, Plavix, see list of "Blood Thinners"), or if you have an active infection going on, you should not have the procedure.  If you are taking any blood thinners, please inform your physician.  How should I prepare for this procedure? Do not eat or drink anything at least six hours prior to the procedure. Bring a driver with you .  It cannot be a taxi. Come accompanied by an adult that can drive you back, and that is strong enough to help you if your legs get weak or numb from the local anesthetic. Take all of your medicines the morning of the procedure with just enough water to swallow them. If you have diabetes, make sure that you are scheduled to have your procedure done first thing in the morning, whenever possible. If you have diabetes, take only half of your insulin dose and notify our nurse that you have done so as soon as you arrive at the clinic. If you are diabetic, but only take blood sugar pills (oral hypoglycemic), then do not take them on the morning of your procedure.  You may take them after you have had the procedure. Do not take aspirin or any aspirin-containing medications, at least eleven (11) days prior to the procedure.  They may prolong bleeding. Wear loose fitting clothing that may be easy to take off and that you would not mind if it got stained with Betadine or blood. Do not wear any jewelry or perfume Remove any nail coloring.  It will interfere with some of our monitoring equipment.  NOTE: Remember that this is not meant to be interpreted as a complete list of all possible complications.  Unforeseen problems may occur.  BLOOD THINNERS The following drugs contain aspirin or other products,  which can cause increased bleeding during surgery and should not be taken for 2 weeks prior to and 1 week after surgery.  If you should need take something for relief of minor pain, you may take acetaminophen which is found in Tylenol,m Datril, Anacin-3 and Panadol. It is not blood thinner. The products listed below are.  Do not take any of the products listed below in addition to any listed on your instruction sheet.  A.P.C or A.P.C with Codeine Codeine Phosphate Capsules #3 Ibuprofen Ridaura  ABC compound Congesprin Imuran rimadil  Advil Cope Indocin Robaxisal  Alka-Seltzer Effervescent Pain Reliever and Antacid Coricidin or Coricidin-D  Indomethacin Rufen  Alka-Seltzer plus Cold Medicine Cosprin Ketoprofen S-A-C Tablets  Anacin Analgesic Tablets or Capsules Coumadin   Korlgesic Salflex  Anacin Extra Strength Analgesic tablets or capsules CP-2 Tablets Lanoril Salicylate  Anaprox Cuprimine Capsules Levenox Salocol  Anexsia-D Dalteparin Magan Salsalate  Anodynos Darvon compound Magnesium Salicylate Sine-off  Ansaid Dasin Capsules Magsal Sodium Salicylate  Anturane Depen Capsules Marnal Soma  APF Arthritis pain formula Dewitt's Pills Measurin Stanback  Argesic Dia-Gesic Meclofenamic Sulfinpyrazone  Arthritis Bayer Timed Release Aspirin Diclofenac Meclomen Sulindac  Arthritis pain formula Anacin Dicumarol Medipren Supac  Analgesic (Safety coated) Arthralgen Diffunasal Mefanamic Suprofen  Arthritis Strength Bufferin Dihydrocodeine Mepro Compound Suprol  Arthropan liquid Dopirydamole Methcarbomol with Aspirin Synalgos  ASA tablets/Enseals Disalcid Micrainin Tagament  Ascriptin Doan's Midol Talwin  Ascriptin A/D Dolene Mobidin Tanderil  Ascriptin Extra Strength Dolobid Moblgesic Ticlid  Ascriptin with Codeine Doloprin or Doloprin with Codeine Momentum Tolectin  Asperbuf Duoprin Mono-gesic Trendar  Aspergum Duradyne Motrin or Motrin IB Triminicin  Aspirin plain, buffered or enteric coated  Durasal Myochrisine Trigesic  Aspirin Suppositories Easprin Nalfon Trillsate  Aspirin with Codeine Ecotrin Regular or Extra Strength Naprosyn Uracel  Atromid-S Efficin Naproxen Ursinus  Auranofin Capsules Elmiron Neocylate Vanquish  Axotal Emagrin Norgesic Verin  Azathioprine Empirin or Empirin with Codeine Normiflo Vitamin E  Azolid Emprazil Nuprin Voltaren  Bayer Aspirin plain, buffered or children's or timed BC Tablets or powders Encaprin Orgaran Warfarin Sodium  Buff-a-Comp Enoxaparin Orudis Zorpin  Buff-a-Comp with Codeine Equegesic Os-Cal-Gesic   Buffaprin Excedrin plain, buffered or Extra Strength Oxalid   Bufferin Arthritis Strength Feldene Oxphenbutazone   Bufferin plain or Extra Strength Feldene Capsules Oxycodone with Aspirin   Bufferin with Codeine Fenoprofen Fenoprofen Pabalate or Pabalate-SF   Buffets II Flogesic Panagesic   Buffinol plain or Extra Strength Florinal or Florinal with Codeine Panwarfarin   Buf-Tabs Flurbiprofen Penicillamine   Butalbital Compound Four-way cold tablets Penicillin   Butazolidin Fragmin Pepto-Bismol   Carbenicillin Geminisyn Percodan   Carna Arthritis Reliever Geopen Persantine   Carprofen Gold's salt Persistin   Chloramphenicol Goody's Phenylbutazone   Chloromycetin Haltrain Piroxlcam   Clmetidine heparin Plaquenil   Cllnoril Hyco-pap Ponstel   Clofibrate Hydroxy chloroquine Propoxyphen         Before stopping any of these medications, be sure to consult the physician who ordered them.  Some, such as Coumadin (Warfarin) are ordered to prevent or treat serious conditions such as "deep thrombosis", "pumonary embolisms", and other heart problems.  The amount of time that you may need off of the medication may also vary with the medication and the reason for which you were taking it.  If you are taking any of these medications, please make sure you notify your pain physician before you undergo any procedures.         Radiofrequency  Ablation Radiofrequency ablation is a procedure that is performed to relieve pain. The procedure is often used for back, neck, or arm pain. Radiofrequency ablation involves the use of a machine that creates radio waves to make heat. During the procedure, the heat is applied to the nerve that carries the pain signal. The heat damages the nerve and interferes with the pain signal. Pain relief usually starts about 2 weeks after the procedure and lasts for 6 months to 1 year. Tell a health care provider about: Any allergies you have. All medicines you are taking, including vitamins, herbs, eye drops, creams, and over-the-counter medicines. Any problems you or family members have had with anesthetic medicines. Any bleeding problems you have. Any surgeries you have had. Any medical conditions you have. Whether you are pregnant or may   be pregnant. What are the risks? Generally, this is a safe procedure. However, problems may occur, including: Pain or soreness at the injection site. Allergic reaction to medicines given during the procedure. Bleeding. Infection at the injection site. Damage to nerves or blood vessels. What happens before the procedure? When to stop eating and drinking Follow instructions from your health care provider about what you may eat and drink before your procedure. These may include: 8 hours before the procedure Stop eating most foods. Do not eat meat, fried foods, or fatty foods. Eat only light foods, such as toast or crackers. All liquids are okay except energy drinks and alcohol. 6 hours before the procedure Stop eating. Drink only clear liquids, such as water, clear fruit juice, black coffee, plain tea, and sports drinks. Do not drink energy drinks or alcohol. 2 hours before the procedure Stop drinking all liquids. You may be allowed to take medicine with small sips of water. If you do not follow your health care provider's instructions, your procedure may be  delayed or canceled. Medicines Ask your health care provider about: Changing or stopping your regular medicines. This is especially important if you are taking diabetes medicines or blood thinners. Taking medicines such as aspirin and ibuprofen. These medicines can thin your blood. Do not take these medicines unless your health care provider tells you to take them. Taking over-the-counter medicines, vitamins, herbs, and supplements. General instructions Ask your health care provider what steps will be taken to help prevent infection. These steps may include: Removing hair at the procedure site. Washing skin with a germ-killing soap. Taking antibiotic medicine. If you will be going home right after the procedure, plan to have a responsible adult: Take you home from the hospital or clinic. You will not be allowed to drive. Care for you for the time you are told. What happens during the procedure?  You will be awake during the procedure. You will need to be able to talk with the health care provider during the procedure. An IV will be inserted into one of your veins. You will be given one or more of the following: A medicine to help you relax (sedative). A medicine to numb the area (local anesthetic). Your health care provider will insert a radiofrequency needle into the area to be treated. This is done with the help of fluoroscopy. A wire that carries the radio waves (electrode) will be put through the radiofrequency needle. An electrical pulse will be sent through the electrode to verify the correct nerve that is causing your pain. You will feel a tingling sensation, and you may have muscle twitching. The tissue around the needle tip will be heated by an electric current that comes from the radiofrequency machine. This will numb the nerves. The needle will be removed. A bandage (dressing) will be put on the insertion area. The procedure may vary among health care providers and  hospitals. What happens after the procedure? Your blood pressure, heart rate, breathing rate, and blood oxygen level will be monitored until you leave the hospital or clinic. Return to your normal activities as told by your health care provider. Ask your health care provider what activities are safe for you. If you were given a sedative during the procedure, it can affect you for several hours. Do not drive or operate machinery until your health care provider says that it is safe. Summary Radiofrequency ablation is a procedure that is performed to relieve pain. The procedure is often used for back,   neck, or arm pain. Radiofrequency ablation involves the use of a machine that creates radio waves to make heat. Plan to have a responsible adult take you home from the hospital or clinic. Do not drive or operate machinery until your health care provider says that it is safe. Return to your normal activities as told by your health care provider. Ask your health care provider what activities are safe for you. This information is not intended to replace advice given to you by your health care provider. Make sure you discuss any questions you have with your health care provider. Document Revised: 11/19/2020 Document Reviewed: 11/19/2020 [employer] Patient Education  2023 [employer]  

## 2022-01-28 ENCOUNTER — Ambulatory Visit
Payer: Medicare Other | Attending: Student in an Organized Health Care Education/Training Program | Admitting: Student in an Organized Health Care Education/Training Program

## 2022-01-28 ENCOUNTER — Ambulatory Visit
Admission: RE | Admit: 2022-01-28 | Discharge: 2022-01-28 | Disposition: A | Discharge status: Home / Self Care | Payer: Medicare Other | Source: Ambulatory Visit | Attending: Student in an Organized Health Care Education/Training Program | Admitting: Student in an Organized Health Care Education/Training Program

## 2022-01-28 ENCOUNTER — Encounter: Payer: Self-pay | Admitting: Student in an Organized Health Care Education/Training Program

## 2022-01-28 VITALS — BP 195/86 | HR 81 | Temp 97.2°F | Resp 16 | Ht 66.0 in | Wt 241.0 lb

## 2022-01-28 DIAGNOSIS — M47816 Spondylosis without myelopathy or radiculopathy, lumbar region: Secondary | ICD-10-CM | POA: Diagnosis present

## 2022-01-28 DIAGNOSIS — G894 Chronic pain syndrome: Secondary | ICD-10-CM | POA: Diagnosis not present

## 2022-01-28 MED ORDER — ROPIVACAINE HCL 2 MG/ML IJ SOLN
9.0000 mL | Freq: Once | INTRAMUSCULAR | Status: AC
  Administered 2022-01-28: 9 mL via PERINEURAL
  Filled 2022-01-28: qty 20

## 2022-01-28 MED ORDER — DIAZEPAM 5 MG PO TABS
ORAL_TABLET | ORAL | Status: AC
  Filled 2022-01-28: qty 1

## 2022-01-28 MED ORDER — LIDOCAINE HCL 2 % IJ SOLN
20.0000 mL | Freq: Once | INTRAMUSCULAR | Status: AC
  Administered 2022-01-28: 400 mg
  Filled 2022-01-28: qty 20

## 2022-01-28 MED ORDER — DIAZEPAM 5 MG PO TABS
5.0000 mg | ORAL_TABLET | ORAL | Status: AC
  Administered 2022-01-28: 5 mg via ORAL

## 2022-01-28 MED ORDER — DEXAMETHASONE SODIUM PHOSPHATE 10 MG/ML IJ SOLN
10.0000 mg | Freq: Once | INTRAMUSCULAR | Status: AC
  Administered 2022-01-28: 10 mg
  Filled 2022-01-28: qty 1

## 2022-01-28 NOTE — Progress Notes (Signed)
Safety precautions to be maintained throughout the outpatient stay will include: orient to surroundings, keep bed in low position, maintain call bell within reach at all times, provide assistance with transfer out of bed and ambulation.  

## 2022-01-28 NOTE — Progress Notes (Signed)
Patient's Name: [name]  MRN: [id number]  Referring Provider: Lauro Regulus, MD  DOB: 04/25/1946  PCP: Lauro Regulus, MD  DOS: 01/28/2022  Note by: Edward Jolly, MD  Service setting: Ambulatory outpatient  Specialty: Interventional Pain Management  Patient type: Established  Location: [facility] (AMB) Pain Management Facility  Visit type: Interventional Procedure   Primary Reason for Visit: Interventional Pain Management Treatment. CC: Back Pain   Procedure:       Anesthesia, Analgesia, Anxiolysis:  Type: Thermal Lumbar Facet, Medial Branch Radiofrequency Ablation/Neurotomy #9 Level: L3, L4, L5,  Medial Branch Level(s). These levels will denervate the L3-4, and the L4-5 lumbar facet joints. Primary Purpose: Therapeutic Region: Posterolateral Lumbosacral Spine Laterality: Right  Previously done 07/30/21  Type: Local Anesthesia with PO Valium 5 mg Indication(s): Analgesia and Anxiety Local Anesthetic: Lidocaine 1%   Indications: 1. Lumbar facet arthropathy   2. Lumbar spondylosis   3. Chronic pain syndrome     [name] has been dealing with the above chronic pain for longer than three months and has either failed to respond, was unable to tolerate, or simply did not get enough benefit from other more conservative therapies including, but not limited to: 1. Over-the-counter medications 2. Anti-inflammatory medications 3. Muscle relaxants 4. Membrane stabilizers 5. Opioids 6. Physical therapy 7. Modalities (Heat, ice, etc.) 8. Invasive techniques such as nerve blocks. [name] has attained more than 50% relief of the pain from a series of diagnostic injections conducted in separate occasions.  Pain Score: Pre-procedure: 5 /10 Post-procedure: 0-No pain/10  Pre-op Assessment:  [name] is a 76 y.o. (year old), female patient, seen today for interventional treatment. She  has a past surgical history that  includes Ankle reconstruction (Left); Brachioplasty; Cataract extraction; Cholecystectomy; Soft Tissue Tumor Resection; Abdominal hysterectomy (07/12/2014); Joint replacement (Bilateral, 07/2014, 03/2015); nasal reconstrucion; Toe Surgery (Right); Shoulder open rotator cuff repair; Colonoscopy with propofol (N/A, 12/07/2016); Reduction mammaplasty (Bilateral); and Breast reduction surgery (Bilateral, 2020). [name] has a current medication list which includes the following prescription(s): acetaminophen, albuterol, amantadine, amlodipine, vitamin c, aspirin ec, azelastine, baclofen, biotin, carvedilol, celecoxib, vitamin d3, donepezil, esomeprazole, fluticasone, fluticasone furoate-vilanterol, folic acid, levothyroxine, losartan, magnesium, melatonin, montelukast, multivitamin, nystatin, onabotulinumtoxina, potassium chloride sa, rybelsus, topiramate, torsemide, tramadol, trazodone, venlafaxine hcl, and zinc gluconate. Her primarily concern today is the Back Pain   Initial Vital Signs:  Pulse/HCG Rate: 81ECG Heart Rate: 80 Temp: (!) 97.2 F (36.2 C) Resp: 18 BP: (!) 145/64 SpO2: 100 %  BMI: Estimated body mass index is 38.9 kg/m as calculated from the following:   Height as of this encounter: 5\' 6"  (1.676 m).   Weight as of this encounter: 241 lb (109.3 kg).  Risk Assessment: Allergies: Reviewed. She is allergic to codeine, povidone iodine, hydromorphone, latex, oxycodone-acetaminophen, sulfa antibiotics, bupropion, gabapentin, hydrocodone, morphine and related, and procaine.  Allergy Precautions: None required Coagulopathies: Reviewed. None identified.  Blood-thinner therapy: None at this time Active Infection(s): Reviewed. None identified. Ms. [name] is afebrile  Site Confirmation: [name] was asked to confirm the procedure and laterality before marking the site Procedure checklist: Completed Consent: Before the procedure and under the  influence of no sedative(s), amnesic(s), or anxiolytics, the patient was informed of the treatment options, risks and possible complications. To fulfill our ethical and legal obligations, as recommended by the American Medical Association's Code of Ethics, I have informed the patient of my clinical impression; the nature and purpose of the  treatment or procedure; the risks, benefits, and possible complications of the intervention; the alternatives, including doing nothing; the risk(s) and benefit(s) of the alternative treatment(s) or procedure(s); and the risk(s) and benefit(s) of doing nothing. The patient was provided information about the general risks and possible complications associated with the procedure. These may include, but are not limited to: failure to achieve desired goals, infection, bleeding, organ or nerve damage, allergic reactions, paralysis, and death. In addition, the patient was informed of those risks and complications associated to Spine-related procedures, such as failure to decrease pain; infection (i.e.: Meningitis, epidural or intraspinal abscess); bleeding (i.e.: epidural hematoma, subarachnoid hemorrhage, or any other type of intraspinal or peri-dural bleeding); organ or nerve damage (i.e.: Any type of peripheral nerve, nerve root, or spinal cord injury) with subsequent damage to sensory, motor, and/or autonomic systems, resulting in permanent pain, numbness, and/or weakness of one or several areas of the body; allergic reactions; (i.e.: anaphylactic reaction); and/or death. Furthermore, the patient was informed of those risks and complications associated with the medications. These include, but are not limited to: allergic reactions (i.e.: anaphylactic or anaphylactoid reaction(s)); adrenal axis suppression; blood sugar elevation that in diabetics may result in ketoacidosis or comma; water retention that in patients with history of congestive heart failure may result in shortness of  breath, pulmonary edema, and decompensation with resultant heart failure; weight gain; swelling or edema; medication-induced neural toxicity; particulate matter embolism and blood vessel occlusion with resultant organ, and/or nervous system infarction; and/or aseptic necrosis of one or more joints. Finally, the patient was informed that Medicine is not an exact science; therefore, there is also the possibility of unforeseen or unpredictable risks and/or possible complications that may result in a catastrophic outcome. The patient indicated having understood very clearly. We have given the patient no guarantees and we have made no promises. Enough time was given to the patient to ask questions, all of which were answered to the patient's satisfaction. [name] has indicated that she wanted to continue with the procedure. Attestation: I, the ordering provider, attest that I have discussed with the patient the benefits, risks, side-effects, alternatives, likelihood of achieving goals, and potential problems during recovery for the procedure that I have provided informed consent. Date  Time: 01/28/2022  9:04 AM  Pre-Procedure Preparation:  Monitoring: As per clinic protocol. Respiration, ETCO2, SpO2, BP, heart rate and rhythm monitor placed and checked for adequate function Safety Precautions: Patient was assessed for positional comfort and pressure points before starting the procedure. Time-out: I initiated and conducted the "Time-out" before starting the procedure, as per protocol. The patient was asked to participate by confirming the accuracy of the "Time Out" information. Verification of the correct person, site, and procedure were performed and confirmed by me, the nursing staff, and the patient. "Time-out" conducted as per Joint Commission's Universal Protocol (UP.01.01.01). Time: 1030  Description of Procedure:       Position: Prone Laterality: Right Levels:   L3, L4, L5,  Medial  Branch Level(s), at the L3-4 and the L4-5 lumbar facet joints. Area Prepped: Lumbosacral Prepping solution: ChloraPrep (2% chlorhexidine gluconate and 70% isopropyl alcohol) Safety Precautions: Aspiration looking for blood return was conducted prior to all injections. At no point did we inject any substances, as a needle was being advanced. Before injecting, the patient was told to immediately notify me if she was experiencing any new onset of "ringing in the ears, or metallic taste in the mouth". No attempts were made at seeking any  paresthesias. Safe injection practices and needle disposal techniques used. Medications properly checked for expiration dates. SDV (single dose vial) medications used. After the completion of the procedure, all disposable equipment used was discarded in the proper designated medical waste containers. Local Anesthesia: Protocol guidelines were followed. The patient was positioned over the fluoroscopy table. The area was prepped in the usual manner. The time-out was completed. The target area was identified using fluoroscopy. A 12-in long, straight, sterile hemostat was used with fluoroscopic guidance to locate the targets for each level blocked. Once located, the skin was marked with an approved surgical skin marker. Once all sites were marked, the skin (epidermis, dermis, and hypodermis), as well as deeper tissues (fat, connective tissue and muscle) were infiltrated with a small amount of a short-acting local anesthetic, loaded on a 10cc syringe with a 25G, 1.5-in  Needle. An appropriate amount of time was allowed for local anesthetics to take effect before proceeding to the next step. Local Anesthetic: Lidocaine 1.0% The unused portion of the local anesthetic was discarded in the proper designated containers. Technical explanation of process:  Radiofrequency Ablation (RFA)  L3 Medial Branch Nerve RFA: The target area for the L3 medial branch is at the junction of the  postero-lateral aspect of the superior articular process and the superior, posterior, and medial edge of the transverse process of L4. Under fluoroscopic guidance, a Radiofrequency needle was inserted until contact was made with os over the superior postero-lateral aspect of the pedicular shadow (target area). Sensory and motor testing was conducted to properly adjust the position of the needle. Once satisfactory placement of the needle was achieved, the numbing solution was slowly injected after negative aspiration for blood. 1 mL of the nerve block solution was injected without difficulty or complication. After waiting for at least 3 minutes, the ablation was performed. Once completed, the needle was removed intact. L4 Medial Branch Nerve RFA: The target area for the L4 medial branch is at the junction of the postero-lateral aspect of the superior articular process and the superior, posterior, and medial edge of the transverse process of L5. Under fluoroscopic guidance, a Radiofrequency needle was inserted until contact was made with os over the superior postero-lateral aspect of the pedicular shadow (target area). Sensory and motor testing was conducted to properly adjust the position of the needle. Once satisfactory placement of the needle was achieved, the numbing solution was slowly injected after negative aspiration for blood. 2mL of the nerve block solution was injected without difficulty or complication. After waiting for at least 3 minutes, the ablation was performed. Once completed, the needle was removed intact. L5 Medial Branch Nerve RFA: The target area for the L5 medial branch is at the junction of the postero-lateral aspect of the superior articular process of S1 and the superior, posterior, and medial edge of the sacral ala. Under fluoroscopic guidance, a Radiofrequency needle was inserted until contact was made with os over the superior postero-lateral aspect of the pedicular shadow (target  area). Sensory and motor testing was conducted to properly adjust the position of the needle. Once satisfactory placement of the needle was achieved, the numbing solution was slowly injected after negative aspiration for blood. 33mL of the nerve block solution was injected without difficulty or complication. After waiting for at least 3 minutes, the ablation was performed. Once completed, the needle was removed intact.  Radiofrequency lesioning (ablation):  Radiofrequency Generator:  [employer] Sensory Stimulation Parameters: 50 Hz was used to locate & identify the nerve, making  sure that the needle was positioned such that there was no sensory stimulation below 0.3 V or above 0.7 V. Motor Stimulation Parameters: 2 Hz was used to evaluate the motor component. Care was taken not to lesion any nerves that demonstrated motor stimulation of the lower extremities at an output of less than 2.5 times that of the sensory threshold, or a maximum of 2.0 V. Lesioning Technique Parameters: Standard Radiofrequency settings. (Not bipolar or pulsed.) Temperature Settings: 80 degrees C Lesioning time: 60 seconds Intra-operative Compliance: Compliant Materials & Medications: Needle(s) (Electrode/Cannula) Type: Teflon-coated, curved tip, Radiofrequency needle(s) Gauge: 22G Length: 10cm Numbing solution: 5 cc solution of 4 cc of 0.2% Ropivacaine and 1 cc of Decadron 10mg /cc, 1-1.5 cc injected at each level above prior to ablation. The unused portion of the solution was discarded in the proper designated containers.  Once the entire procedure was completed, the treated area was cleaned, making sure to leave some of the prepping solution back to take advantage of its long term bactericidal properties.  Illustration of the posterior view of the lumbar spine and the posterior neural structures. Laminae of L2 through S1 are labeled. DPRL5, dorsal primary ramus of L5; DPRS1, dorsal primary ramus of S1; DPR3, dorsal primary  ramus of L3; FJ, facet (zygapophyseal) joint L3-L4; I, inferior articular process of L4; LB1, lateral branch of dorsal primary ramus of L1; IAB, inferior articular branches from L3 medial branch (supplies L4-L5 facet joint); IBP, intermediate branch plexus; MB3, medial branch of dorsal primary ramus of L3; NR3, third lumbar nerve root; S, superior articular process of L5; SAB, superior articular branches from L4 (supplies L4-5 facet joint also); TP3, transverse process of L3.  Vitals:   01/28/22 1043 01/28/22 1048 01/28/22 1052 01/28/22 1058  BP: (!) 197/106 (!) 183/96 (!) 190/98 (!) 195/86  Pulse:      Resp: 13 14 16    Temp:      TempSrc:      SpO2: 98% 98% 98%   Weight:      Height:        Start Time: 1030 hrs. End Time: 1051 hrs.  Imaging Guidance (Spinal):  Type of Imaging Technique: Fluoroscopy Guidance (Spinal) Indication(s): Assistance in needle guidance and placement for procedures requiring needle placement in or near specific anatomical locations not easily accessible without such assistance. Exposure Time: Please see nurses notes. Contrast: None used. Fluoroscopic Guidance: I was personally present during the use of fluoroscopy. "Tunnel Vision Technique" used to obtain the best possible view of the target area. Parallax error corrected before commencing the procedure. "Direction-depth-direction" technique used to introduce the needle under continuous pulsed fluoroscopy. Once target was reached, antero-posterior, oblique, and lateral fluoroscopic projection used confirm needle placement in all planes. Images permanently stored in EMR. Interpretation: No contrast injected. I personally interpreted the imaging intraoperatively. Adequate needle placement confirmed in multiple planes. Permanent images saved into the patient's record.   Post-operative Assessment:  Post-procedure Vital Signs:  Pulse/HCG Rate: 8178 Temp: (!) 97.2 F (36.2 C) Resp: 16 BP: (!) 195/86 SpO2: 98  %  EBL: None  Complications: No immediate post-treatment complications observed by team, or reported by patient.  Note: The patient tolerated the entire procedure well. A repeat set of vitals were taken after the procedure and the patient was kept under observation following institutional policy, for this type of procedure. Post-procedural neurological assessment was performed, showing return to baseline, prior to discharge. The patient was provided with post-procedure discharge instructions, including a section on how to  identify potential problems. Should any problems arise concerning this procedure, the patient was given instructions to immediately contact us, at any time, without hesitation. In any case, we plan to contact the patient by telephone for a follow-up status report regarding this interventional procedure.  Comments:  No additional relevant information. 5 out of 5 strength bilateral lower extremity: Plantar flexion, dorsiflexion, knee flexion, knee extension.  Plan of Care   Imaging Orders         DG PAIN CLINIC C-ARM 1-60 MIN NO REPORT    Procedure Orders    No procedure(s) ordered today    Medications ordered for procedure: Meds ordered this encounter  Medications   lidocaine (XYLOCAINE) 2 % (with pres) injection 400 mg   diazepam (VALIUM) tablet 5 mg    Make sure Flumazenil is available in the pyxis when using this medication. If oversedation occurs, administer 0.2 mg IV over 15 sec. If after 45 sec no response, administer 0.2 mg again over 1 min; may repeat at 1 min intervals; not to exceed 4 doses (1 mg)   dexamethasone (DECADRON) injection 10 mg   ropivacaine (PF) 2 mg/mL (0.2%) (NAROPIN) injection 9 mL   Medications administered: We administered lidocaine, diazepam, dexamethasone, and ropivacaine (PF) 2 mg/mL (0.2%).  See the medical record for exact dosing, route, and time of administration.   Disposition: Discharge home  Discharge Date & Time: 01/28/2022; 1100  hrs.   Physician-requested Follow-up: Return in about 2 weeks (around 02/11/2022) for Contra-lateral RFA, in clinic (PO Valium).  Future Appointments  Date Time Provider Department Center  02/23/2022  9:00 AM Edward Jolly, MD [facility] None  04/21/2022  9:40 AM Edward Jolly, MD [facility] None   Primary Care Physician: Lauro Regulus, MD Location: [facility] Note by: Edward Jolly, MD Date: 01/28/2022; Time: 11:58 AM  Disclaimer:  Medicine is not an exact science. The only guarantee in medicine is that nothing is guaranteed. It is important to note that the decision to proceed with this intervention was based on the information collected from the patient. The Data and conclusions were drawn from the patient's questionnaire, the interview, and the physical examination. Because the information was provided in large part by the patient, it cannot be guaranteed that it has not been purposely or unconsciously manipulated. Every effort has been made to obtain as much relevant data as possible for this evaluation. It is important to note that the conclusions that lead to this procedure are derived in large part from the available data. Always take into account that the treatment will also be dependent on availability of resources and existing treatment guidelines, considered by other Pain Management Practitioners as being common knowledge and practice, at the time of the intervention. For Medico-Legal purposes, it is also important to point out that variation in procedural techniques and pharmacological choices are the acceptable norm. The indications, contraindications, technique, and results of the above procedure should only be interpreted and judged by a Board-Certified Interventional Pain Specialist with extensive familiarity and expertise in the same exact procedure and technique.

## 2022-01-28 NOTE — Patient Instructions (Signed)

## 2022-02-11 ENCOUNTER — Ambulatory Visit: Payer: Medicare Other | Admitting: Student in an Organized Health Care Education/Training Program

## 2022-02-23 ENCOUNTER — Ambulatory Visit
Admission: RE | Admit: 2022-02-23 | Discharge: 2022-02-23 | Disposition: A | Discharge status: Home / Self Care | Payer: Medicare Other | Source: Ambulatory Visit | Attending: Student in an Organized Health Care Education/Training Program | Admitting: Student in an Organized Health Care Education/Training Program

## 2022-02-23 ENCOUNTER — Encounter: Payer: Self-pay | Admitting: Student in an Organized Health Care Education/Training Program

## 2022-02-23 ENCOUNTER — Ambulatory Visit
Payer: Medicare Other | Attending: Student in an Organized Health Care Education/Training Program | Admitting: Student in an Organized Health Care Education/Training Program

## 2022-02-23 VITALS — BP 146/76 | HR 78 | Temp 97.2°F | Resp 13 | Ht 66.0 in | Wt 241.0 lb

## 2022-02-23 DIAGNOSIS — G894 Chronic pain syndrome: Secondary | ICD-10-CM | POA: Insufficient documentation

## 2022-02-23 DIAGNOSIS — M47816 Spondylosis without myelopathy or radiculopathy, lumbar region: Secondary | ICD-10-CM | POA: Diagnosis present

## 2022-02-23 MED ORDER — LIDOCAINE HCL 2 % IJ SOLN
20.0000 mL | Freq: Once | INTRAMUSCULAR | Status: AC
  Administered 2022-02-23: 400 mg

## 2022-02-23 MED ORDER — DIAZEPAM 5 MG PO TABS
5.0000 mg | ORAL_TABLET | ORAL | Status: AC
Start: 2022-02-23 — End: 2022-02-23
  Administered 2022-02-23: 5 mg via ORAL

## 2022-02-23 MED ORDER — DEXAMETHASONE SODIUM PHOSPHATE 10 MG/ML IJ SOLN
INTRAMUSCULAR | Status: AC
  Filled 2022-02-23: qty 1

## 2022-02-23 MED ORDER — DEXAMETHASONE SODIUM PHOSPHATE 10 MG/ML IJ SOLN
10.0000 mg | Freq: Once | INTRAMUSCULAR | Status: AC
  Administered 2022-02-23: 10 mg

## 2022-02-23 MED ORDER — LIDOCAINE HCL 2 % IJ SOLN
INTRAMUSCULAR | Status: AC
  Filled 2022-02-23: qty 20

## 2022-02-23 MED ORDER — ROPIVACAINE HCL 2 MG/ML IJ SOLN
9.0000 mL | Freq: Once | INTRAMUSCULAR | Status: AC
  Administered 2022-02-23: 9 mL via PERINEURAL

## 2022-02-23 MED ORDER — ROPIVACAINE HCL 2 MG/ML IJ SOLN
INTRAMUSCULAR | Status: AC
  Filled 2022-02-23: qty 20

## 2022-02-23 MED ORDER — DIAZEPAM 5 MG PO TABS
ORAL_TABLET | ORAL | Status: AC
  Filled 2022-02-23: qty 1

## 2022-02-23 NOTE — Progress Notes (Signed)
Patient's Name: [name]  MRN: [id number]  Referring Provider: Lauro Regulus, MD  DOB: 07-17-1945  PCP: Lauro Regulus, MD  DOS: 02/23/2022  Note by: Edward Jolly, MD  Service setting: Ambulatory outpatient  Specialty: Interventional Pain Management  Patient type: Established  Location: [facility] (AMB) Pain Management Facility  Visit type: Interventional Procedure   Primary Reason for Visit: Interventional Pain Management Treatment. CC: Back Pain (lower)   Procedure:       Anesthesia, Analgesia, Anxiolysis:  Type: Thermal Lumbar Facet, Medial Branch Radiofrequency Ablation/Neurotomy #8 Level:L3, L4, L5,  Medial Branch Level(s). These levels will denervate the L3-4, and the L4-5 lumbar facet joints. Primary Purpose: Therapeutic Region: Posterolateral Lumbosacral Spine Laterality: Left  (#1 done 12/01/2017 left L3, L4, L5 RFA, #2 done 12/14/2018, #3 done 06/21/19, #4 12/04/2019, #5 08/14/20, #6 02/19/21, #7 08/13/21 )   Type: Local Anesthesia with PO Valium Indication(s): Analgesia and Anxiety  Local Anesthetic: Lidocaine 1%   Indications: 1. Lumbar facet arthropathy   2. Lumbar spondylosis   3. Chronic pain syndrome      [name] has been dealing with the above chronic pain for longer than three months and has either failed to respond, was unable to tolerate, or simply did not get enough benefit from other more conservative therapies including, but not limited to: 1. Over-the-counter medications 2. Anti-inflammatory medications 3. Muscle relaxants 4. Membrane stabilizers 5. Opioids 6. Physical therapy 7. Modalities (Heat, ice, etc.) 8. Invasive techniques such as nerve blocks. [name] has attained more than 50% relief of the pain from a series of diagnostic injections conducted in separate occasions.  Pain Score: Pre-procedure: 6 /10 Post-procedure: 0-No pain/10  Pre-op Assessment:  Ms. Satara Virella is a 76 y.o.  (year old), female patient, seen today for interventional treatment. She  has a past surgical history that includes Ankle reconstruction (Left); Brachioplasty; Cataract extraction; Cholecystectomy; Soft Tissue Tumor Resection; Abdominal hysterectomy (07/12/2014); Joint replacement (Bilateral, 07/2014, 03/2015); nasal reconstrucion; Toe Surgery (Right); Shoulder open rotator cuff repair; Colonoscopy with propofol (N/A, 12/07/2016); Reduction mammaplasty (Bilateral); and Breast reduction surgery (Bilateral, 2020). [name] has a current medication list which includes the following prescription(s): acetaminophen, albuterol, amantadine, amlodipine, aripiprazole, vitamin c, azelastine, baclofen, biotin, carvedilol, celecoxib, vitamin d3, donepezil, esomeprazole, fluticasone, fluticasone furoate-vilanterol, folic acid, levothyroxine, losartan, magnesium, melatonin, montelukast, multivitamin, nystatin, onabotulinumtoxina, potassium chloride sa, rybelsus, topiramate, torsemide, tramadol, trazodone, venlafaxine hcl, zinc gluconate, and aspirin ec. Her primarily concern today is the Back Pain (lower)   Initial Vital Signs:  Pulse/HCG Rate: 78ECG Heart Rate: 91 Temp: (!) 97.2 F (36.2 C) Resp: 18 BP: (!) 145/63 SpO2: 100 %  BMI: Estimated body mass index is 38.9 kg/m as calculated from the following:   Height as of this encounter: 5\' 6"  (1.676 m).   Weight as of this encounter: 241 lb (109.3 kg).  Risk Assessment: Allergies: Reviewed. She is allergic to codeine, povidone iodine, hydromorphone, latex, oxycodone-acetaminophen, sulfa antibiotics, bupropion, gabapentin, hydrocodone, morphine and related, and procaine.  Allergy Precautions: None required Coagulopathies: Reviewed. None identified.  Blood-thinner therapy: None at this time Active Infection(s): Reviewed. None identified. Ms. [name] is afebrile  Site Confirmation: [name] was asked to confirm the  procedure and laterality before marking the site Procedure checklist: Completed Consent: Before the procedure and under the influence of no sedative(s), amnesic(s), or anxiolytics, the patient was informed of the treatment options, risks and possible complications. To fulfill our ethical and legal obligations, as recommended  by the American Medical Association's Code of Ethics, I have informed the patient of my clinical impression; the nature and purpose of the treatment or procedure; the risks, benefits, and possible complications of the intervention; the alternatives, including doing nothing; the risk(s) and benefit(s) of the alternative treatment(s) or procedure(s); and the risk(s) and benefit(s) of doing nothing. The patient was provided information about the general risks and possible complications associated with the procedure. These may include, but are not limited to: failure to achieve desired goals, infection, bleeding, organ or nerve damage, allergic reactions, paralysis, and death. In addition, the patient was informed of those risks and complications associated to Spine-related procedures, such as failure to decrease pain; infection (i.e.: Meningitis, epidural or intraspinal abscess); bleeding (i.e.: epidural hematoma, subarachnoid hemorrhage, or any other type of intraspinal or peri-dural bleeding); organ or nerve damage (i.e.: Any type of peripheral nerve, nerve root, or spinal cord injury) with subsequent damage to sensory, motor, and/or autonomic systems, resulting in permanent pain, numbness, and/or weakness of one or several areas of the body; allergic reactions; (i.e.: anaphylactic reaction); and/or death. Furthermore, the patient was informed of those risks and complications associated with the medications. These include, but are not limited to: allergic reactions (i.e.: anaphylactic or anaphylactoid reaction(s)); adrenal axis suppression; blood sugar elevation that in diabetics may result  in ketoacidosis or comma; water retention that in patients with history of congestive heart failure may result in shortness of breath, pulmonary edema, and decompensation with resultant heart failure; weight gain; swelling or edema; medication-induced neural toxicity; particulate matter embolism and blood vessel occlusion with resultant organ, and/or nervous system infarction; and/or aseptic necrosis of one or more joints. Finally, the patient was informed that Medicine is not an exact science; therefore, there is also the possibility of unforeseen or unpredictable risks and/or possible complications that may result in a catastrophic outcome. The patient indicated having understood very clearly. We have given the patient no guarantees and we have made no promises. Enough time was given to the patient to ask questions, all of which were answered to the patient's satisfaction. [name] has indicated that she wanted to continue with the procedure. Attestation: I, the ordering provider, attest that I have discussed with the patient the benefits, risks, side-effects, alternatives, likelihood of achieving goals, and potential problems during recovery for the procedure that I have provided informed consent. Date  Time: 02/23/2022  8:56 AM  Pre-Procedure Preparation:  Monitoring: As per clinic protocol. Respiration, ETCO2, SpO2, BP, heart rate and rhythm monitor placed and checked for adequate function Safety Precautions: Patient was assessed for positional comfort and pressure points before starting the procedure. Time-out: I initiated and conducted the "Time-out" before starting the procedure, as per protocol. The patient was asked to participate by confirming the accuracy of the "Time Out" information. Verification of the correct person, site, and procedure were performed and confirmed by me, the nursing staff, and the patient. "Time-out" conducted as per Joint Commission's Universal Protocol  (UP.01.01.01). Time: 0935  Description of Procedure:       Position: Prone Laterality: Left Levels:   L3, L4, L5,  Medial Branch Level(s), at the L3-4 and the L4-5 lumbar facet joints. Area Prepped: Lumbosacral Prepping solution: ChloraPrep (2% chlorhexidine gluconate and 70% isopropyl alcohol) Safety Precautions: Aspiration looking for blood return was conducted prior to all injections. At no point did we inject any substances, as a needle was being advanced. Before injecting, the patient was told to immediately notify me if she  was experiencing any new onset of "ringing in the ears, or metallic taste in the mouth". No attempts were made at seeking any paresthesias. Safe injection practices and needle disposal techniques used. Medications properly checked for expiration dates. SDV (single dose vial) medications used. After the completion of the procedure, all disposable equipment used was discarded in the proper designated medical waste containers. Local Anesthesia: Protocol guidelines were followed. The patient was positioned over the fluoroscopy table. The area was prepped in the usual manner. The time-out was completed. The target area was identified using fluoroscopy. A 12-in long, straight, sterile hemostat was used with fluoroscopic guidance to locate the targets for each level blocked. Once located, the skin was marked with an approved surgical skin marker. Once all sites were marked, the skin (epidermis, dermis, and hypodermis), as well as deeper tissues (fat, connective tissue and muscle) were infiltrated with a small amount of a short-acting local anesthetic, loaded on a 10cc syringe with a 25G, 1.5-in  Needle. An appropriate amount of time was allowed for local anesthetics to take effect before proceeding to the next step. Local Anesthetic: Lidocaine 1.0% The unused portion of the local anesthetic was discarded in the proper designated containers. Technical explanation of process:   Radiofrequency Ablation (RFA)  L3 Medial Branch Nerve RFA: The target area for the L3 medial branch is at the junction of the postero-lateral aspect of the superior articular process and the superior, posterior, and medial edge of the transverse process of L4. Under fluoroscopic guidance, a Radiofrequency needle was inserted until contact was made with os over the superior postero-lateral aspect of the pedicular shadow (target area). Sensory and motor testing was conducted to properly adjust the position of the needle. Once satisfactory placement of the needle was achieved, the numbing solution was slowly injected after negative aspiration for blood. 1 mL of the nerve block solution was injected without difficulty or complication. After waiting for at least 3 minutes, the ablation was performed. Once completed, the needle was removed intact. L4 Medial Branch Nerve RFA: The target area for the L4 medial branch is at the junction of the postero-lateral aspect of the superior articular process and the superior, posterior, and medial edge of the transverse process of L5. Under fluoroscopic guidance, a Radiofrequency needle was inserted until contact was made with os over the superior postero-lateral aspect of the pedicular shadow (target area). Sensory and motor testing was conducted to properly adjust the position of the needle. Once satisfactory placement of the needle was achieved, the numbing solution was slowly injected after negative aspiration for blood. 65mL of the nerve block solution was injected without difficulty or complication. After waiting for at least 3 minutes, the ablation was performed. Once completed, the needle was removed intact. L5 Medial Branch Nerve RFA: The target area for the L5 medial branch is at the junction of the postero-lateral aspect of the superior articular process of S1 and the superior, posterior, and medial edge of the sacral ala. Under fluoroscopic guidance, a Radiofrequency  needle was inserted until contact was made with os over the superior postero-lateral aspect of the pedicular shadow (target area). Sensory and motor testing was conducted to properly adjust the position of the needle. Once satisfactory placement of the needle was achieved, the numbing solution was slowly injected after negative aspiration for blood. 51mL of the nerve block solution was injected without difficulty or complication. After waiting for at least 3 minutes, the ablation was performed. Once completed, the needle was removed intact.  Radiofrequency lesioning (ablation):  Radiofrequency Generator: NeuroTherm [id number] Sensory Stimulation Parameters: 50 Hz was used to locate & identify the nerve, making sure that the needle was positioned such that there was no sensory stimulation below 0.3 V or above 0.7 V. Motor Stimulation Parameters: 2 Hz was used to evaluate the motor component. Care was taken not to lesion any nerves that demonstrated motor stimulation of the lower extremities at an output of less than 2.5 times that of the sensory threshold, or a maximum of 2.0 V. Lesioning Technique Parameters: Standard Radiofrequency settings. (Not bipolar or pulsed.) Temperature Settings: 80 degrees C Lesioning time: 60 seconds Intra-operative Compliance: Compliant Materials & Medications: Needle(s) (Electrode/Cannula) Type: Teflon-coated, curved tip, Radiofrequency needle(s) Gauge: 22G Length: 10cm Numbing solution: 5 cc solution of 4 cc of 0.2% Ropivacaine and 1 cc of Decadron 10mg /cc, 1-1.5 cc injected at each level above prior to ablation. The unused portion of the solution was discarded in the proper designated containers.  Once the entire procedure was completed, the treated area was cleaned, making sure to leave some of the prepping solution back to take advantage of its long term bactericidal properties.  Illustration of the posterior view of the lumbar spine and the posterior neural structures.  Laminae of L2 through S1 are labeled. DPRL5, dorsal primary ramus of L5; DPRS1, dorsal primary ramus of S1; DPR3, dorsal primary ramus of L3; FJ, facet (zygapophyseal) joint L3-L4; I, inferior articular process of L4; LB1, lateral branch of dorsal primary ramus of L1; IAB, inferior articular branches from L3 medial branch (supplies L4-L5 facet joint); IBP, intermediate branch plexus; MB3, medial branch of dorsal primary ramus of L3; NR3, third lumbar nerve root; S, superior articular process of L5; SAB, superior articular branches from L4 (supplies L4-5 facet joint also); TP3, transverse process of L3.  Vitals:   02/23/22 0942 02/23/22 0948 02/23/22 0952 02/23/22 0957  BP: (!) 131/109 (!) 145/86 (!) 155/94 (!) 146/76  Pulse:      Resp: (!) 9 11 12 13   Temp:      TempSrc:      SpO2: 95% 97% 100% 98%  Weight:      Height:        Start Time: 0935 hrs. End Time: 0957 hrs.  Imaging Guidance (Spinal):  Type of Imaging Technique: Fluoroscopy Guidance (Spinal) Indication(s): Assistance in needle guidance and placement for procedures requiring needle placement in or near specific anatomical locations not easily accessible without such assistance. Exposure Time: Please see nurses notes. Contrast: None used. Fluoroscopic Guidance: I was personally present during the use of fluoroscopy. "Tunnel Vision Technique" used to obtain the best possible view of the target area. Parallax error corrected before commencing the procedure. "Direction-depth-direction" technique used to introduce the needle under continuous pulsed fluoroscopy. Once target was reached, antero-posterior, oblique, and lateral fluoroscopic projection used confirm needle placement in all planes. Images permanently stored in EMR. Interpretation: No contrast injected. I personally interpreted the imaging intraoperatively. Adequate needle placement confirmed in multiple planes. Permanent images saved into the patient's record.   Post-operative  Assessment:  Post-procedure Vital Signs:  Pulse/HCG Rate: 7879 Temp: (!) 97.2 F (36.2 C) Resp: 13 BP: (!) 146/76 SpO2: 98 %  EBL: None  Complications: No immediate post-treatment complications observed by team, or reported by patient.  Note: The patient tolerated the entire procedure well. A repeat set of vitals were taken after the procedure and the patient was kept under observation following institutional policy, for this type of procedure. Post-procedural neurological assessment  was performed, showing return to baseline, prior to discharge. The patient was provided with post-procedure discharge instructions, including a section on how to identify potential problems. Should any problems arise concerning this procedure, the patient was given instructions to immediately contact us, at any time, without hesitation. In any case, we plan to contact the patient by telephone for a follow-up status report regarding this interventional procedure.  Comments:  No additional relevant information. 5 out of 5 strength bilateral lower extremity: Plantar flexion, dorsiflexion, knee flexion, knee extension.  Plan of Care   Imaging Orders         DG PAIN [facility] C-ARM 1-60 MIN NO REPORT     Procedure Orders    No procedure(s) ordered today      Medications ordered for procedure: Meds ordered this encounter  Medications   lidocaine (XYLOCAINE) 2 % (with pres) injection 400 mg   diazepam (VALIUM) tablet 5 mg    Make sure Flumazenil is available in the pyxis when using this medication. If oversedation occurs, administer 0.2 mg IV over 15 sec. If after 45 sec no response, administer 0.2 mg again over 1 min; may repeat at 1 min intervals; not to exceed 4 doses (1 mg)   dexamethasone (DECADRON) injection 10 mg   ropivacaine (PF) 2 mg/mL (0.2%) (NAROPIN) injection 9 mL   Medications administered: We administered lidocaine, diazepam, dexamethasone, and ropivacaine (PF) 2 mg/mL (0.2%).  See the  medical record for exact dosing, route, and time of administration.   Disposition: Discharge home  Discharge Date & Time: 02/23/2022; 1009 hrs.   Physician-requested Follow-up: Return for Keep sch. appt.  Future Appointments  Date Time Provider Department Center  04/21/2022  9:40 AM Edward Jolly, MD [facility] None   Primary Care Physician: Lauro Regulus, MD Location: [facility] Note by: Edward Jolly, MD Date: 02/23/2022; Time: 10:52 AM  Disclaimer:  Medicine is not an exact science. The only guarantee in medicine is that nothing is guaranteed. It is important to note that the decision to proceed with this intervention was based on the information collected from the patient. The Data and conclusions were drawn from the patient's questionnaire, the interview, and the physical examination. Because the information was provided in large part by the patient, it cannot be guaranteed that it has not been purposely or unconsciously manipulated. Every effort has been made to obtain as much relevant data as possible for this evaluation. It is important to note that the conclusions that lead to this procedure are derived in large part from the available data. Always take into account that the treatment will also be dependent on availability of resources and existing treatment guidelines, considered by other Pain Management Practitioners as being common knowledge and practice, at the time of the intervention. For Medico-Legal purposes, it is also important to point out that variation in procedural techniques and pharmacological choices are the acceptable norm. The indications, contraindications, technique, and results of the above procedure should only be interpreted and judged by a Board-Certified Interventional Pain Specialist with extensive familiarity and expertise in the same exact procedure and technique.

## 2022-02-23 NOTE — Patient Instructions (Signed)

## 2022-02-23 NOTE — Progress Notes (Signed)
Safety precautions to be maintained throughout the outpatient stay will include: orient to surroundings, keep bed in low position, maintain call bell within reach at all times, provide assistance with transfer out of bed and ambulation.  

## 2022-02-24 ENCOUNTER — Telehealth: Payer: Self-pay | Admitting: *Deleted

## 2022-02-24 NOTE — Telephone Encounter (Signed)
Post procedure call;  patient reports that she is doing well.  

## 2022-04-13 ENCOUNTER — Encounter (INDEPENDENT_AMBULATORY_CARE_PROVIDER_SITE_OTHER): Payer: Self-pay

## 2022-04-21 ENCOUNTER — Encounter: Payer: Self-pay | Admitting: Student in an Organized Health Care Education/Training Program

## 2022-04-21 ENCOUNTER — Ambulatory Visit
Payer: Medicare Other | Attending: Student in an Organized Health Care Education/Training Program | Admitting: Student in an Organized Health Care Education/Training Program

## 2022-04-21 VITALS — BP 141/60 | HR 88 | Temp 97.8°F | Resp 16 | Ht 65.5 in | Wt 241.0 lb

## 2022-04-21 DIAGNOSIS — M5136 Other intervertebral disc degeneration, lumbar region: Secondary | ICD-10-CM | POA: Diagnosis present

## 2022-04-21 DIAGNOSIS — M47816 Spondylosis without myelopathy or radiculopathy, lumbar region: Secondary | ICD-10-CM | POA: Insufficient documentation

## 2022-04-21 DIAGNOSIS — G894 Chronic pain syndrome: Secondary | ICD-10-CM | POA: Insufficient documentation

## 2022-04-21 MED ORDER — TRAMADOL HCL 50 MG PO TABS: 50.0000 mg | ORAL_TABLET | Freq: Two times a day (BID) | ORAL | 2 refills | Status: DC | PRN

## 2022-04-21 NOTE — Progress Notes (Signed)
Nursing Pain Medication Assessment:  Safety precautions to be maintained throughout the outpatient stay will include: orient to surroundings, keep bed in low position, maintain call bell within reach at all times, provide assistance with transfer out of bed and ambulation.  Medication Inspection Compliance: Pill count conducted under aseptic conditions, in front of the patient. Neither the pills nor the bottle was removed from the patient's sight at any time. Once count was completed pills were immediately returned to the patient in their original bottle.  Medication: Tramadol (Ultram) Pill/Patch Count:  42 of 90 pills remain Pill/Patch Appearance: Markings consistent with prescribed medication Bottle Appearance: Standard pharmacy container. Clearly labeled. Filled Date: 29 / 13 / 2023 Last Medication intake:  Today

## 2022-04-21 NOTE — Progress Notes (Signed)
PROVIDER NOTE: Information contained herein reflects review and annotations entered in association with encounter. Interpretation of such information and data should be left to medically-trained personnel. Information provided to patient can be located elsewhere in the medical record under "Patient Instructions". Document created using STT-dictation technology, any transcriptional errors that may result from process are unintentional.    Patient: [name]  Service Category: E/M  Provider: Gillis Santa, MD  DOB: 1945-08-16  DOS: 04/21/2022  Specialty: Interventional Pain Management  MRN: [id number]  Setting: Ambulatory outpatient  PCP: Kirk Ruths, MD  Type: Established Patient    Referring Provider: Kirk Ruths, MD  Location: Office  Delivery: Face-to-face     HPI  [name], a 76 y.o. year old female, is here today because of her Lumbar facet arthropathy [M47.816]. [name]'s primary complain today is Back Pain (Lumbar bilateral )  Last encounter: My last encounter with her was on 02/23/22  Pertinent problems: [name] has Lumbar spondylosis; Lumbar facet arthropathy; Lumbar degenerative disc disease; Spasm of lumbar paraspinous muscle; and Chronic pain syndrome on their pertinent problem list. Pain Assessment: Severity of Chronic pain is reported as a 4 /10. Location: Back Lower, Left, Right/denies. Onset: More than a month ago. Quality: Discomfort, Constant, Aching. Timing: Constant. Modifying factor(s): medications, sitting or lying down. Vitals:  height is 5' 5.5" (1.664 m) and weight is 241 lb (109.3 kg). Her temporal temperature is 97.8 F (36.6 C). Her blood pressure is 141/60 (abnormal) and her pulse is 88. Her respiration is 16 and oxygen saturation is 100%.   Reason for encounter: both, medication management and post-procedure assessment.    Post-procedure evaluation   Left L3, L4, L5 RFA  02/23/2022, right L3, L4, L5 RFA 01/28/2022     Effectiveness:  Initial hour after procedure: 100 %  Subsequent 4-6 hours post-procedure: 100 %  Analgesia past initial 6 hours: 60 % (numbness lasted approx 3 days and then the pain began to slowly return.)  Ongoing improvement:  Analgesic:  70% Function: [name] reports improvement in function ROM: [name] reports improvement in ROM   Pharmacotherapy Assessment   Analgesic: Tramadol 50 to 100 mg twice daily as needed.  Monitoring: [place] PMP: PDMP reviewed during this encounter.       Pharmacotherapy: No side-effects or adverse reactions reported. Compliance: No problems identified. Effectiveness: Clinically acceptable.  Janett Billow, RN  04/21/2022 10:34 AM  Sign when Signing Visit Nursing Pain Medication Assessment:  Safety precautions to be maintained throughout the outpatient stay will include: orient to surroundings, keep bed in low position, maintain call bell within reach at all times, provide assistance with transfer out of bed and ambulation.  Medication Inspection Compliance: Pill count conducted under aseptic conditions, in front of the patient. Neither the pills nor the bottle was removed from the patient's sight at any time. Once count was completed pills were immediately returned to the patient in their original bottle.  Medication: Tramadol (Ultram) Pill/Patch Count:  42 of 90 pills remain Pill/Patch Appearance: Markings consistent with prescribed medication Bottle Appearance: Standard pharmacy container. Clearly labeled. Filled Date: 51 / 13 / 2023 Last Medication intake:  Today     UDS:  Summary  Date Value Ref Range Status  08/01/2020 Note  Final    Comment:    ==================================================================== [employer] Select 13 (MW) ==================================================================== Test  Result       Flag        Units  Drug Present and Declared for Prescription Verification   Tapentadol                     5398         EXPECTED   ng/mg creat    Source of tapentadol is a scheduled prescription medication.  ==================================================================== Test                      Result    Flag   Units      Ref Range   Creatinine              158              mg/dL      >=20 ==================================================================== Declared Medications:  The flagging and interpretation on this report are based on the  following declared medications.  Unexpected results may arise from  inaccuracies in the declared medications.   **Note: The testing scope of this panel includes these medications:   Tapentadol (Nucynta)   **Note: The testing scope of this panel does not include the  following reported medications:   Acetaminophen (Tylenol)  Albuterol  Amantadine  Amlodipine (Norvasc)  Aripiprazole (Abilify)  Aspirin  Azelastine (Astelin)  Baclofen (Lioresal)  Biotin  Carvedilol (Coreg)  Celecoxib (Celebrex)  Donepezil (Aricept)  Esomeprazole (Nexium)  Fluticasone (Breo)  Fluticasone (Flonase)  Folic Acid  Guaifenesin (Robitussin)  Levothyroxine (Synthroid)  Losartan (Cozaar)  Magnesium  Melatonin  Montelukast (Singulair)  Multivitamin  Nystatin (Mycostatin)  Potassium (Klor-Con)  Topiramate (Topamax)  Torsemide (Demadex)  Trazodone (Desyrel)  Undefined Miscellaneous Drug  Venlafaxine  Vilanterol (Breo)  Vitamin C  Vitamin D3  Zinc ==================================================================== For clinical consultation, please call [phone]. ====================================================================      ROS  Constitutional: Denies any fever or chills Gastrointestinal: No reported hemesis, hematochezia, vomiting, or acute GI distress Musculoskeletal:  Low back pain related to lumbar facet arthropathy,  improved after RFA Neurological: No reported episodes of acute onset apraxia, aphasia, dysarthria, agnosia, amnesia, paralysis, loss of coordination, or loss of consciousness  Medication Review  ARIPiprazole, Biotin, OnabotulinumtoxinA, Semaglutide, Venlafaxine HCl, Vitamin D3, acetaminophen, albuterol, amLODipine, amantadine, azelastine, baclofen, carvedilol, celecoxib, donepezil, esomeprazole, fluticasone, fluticasone furoate-vilanterol, folic acid, levothyroxine, losartan, magnesium, melatonin, montelukast, multivitamin, nystatin, potassium chloride SA, topiramate, torsemide, traMADol, traZODone, vitamin C, and zinc gluconate  History Review  Allergy: [name] is allergic to codeine, povidone iodine, hydromorphone, latex, oxycodone-acetaminophen, sulfa antibiotics, bupropion, gabapentin, hydrocodone, morphine and related, and procaine. Drug: [name]  reports no history of drug use. Alcohol:  reports current alcohol use of about 2.0 standard drinks of alcohol per week. Tobacco:  reports that she has never smoked. She has never used smokeless tobacco. Social: [name]  reports that she has never smoked. She has never used smokeless tobacco. She reports current alcohol use of about 2.0 standard drinks of alcohol per week. She reports that she does not use drugs. 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 Surgical: [name]  has a past surgical history that includes Ankle reconstruction (Left); Brachioplasty; Cataract extraction; Cholecystectomy; Soft Tissue Tumor Resection; Abdominal  hysterectomy (07/12/2014); Joint  replacement (Bilateral, 07/2014, 03/2015); nasal reconstrucion; Toe Surgery (Right); Shoulder open rotator cuff repair; Colonoscopy with propofol (N/A, 12/07/2016); Reduction mammaplasty (Bilateral); and Breast reduction surgery (Bilateral, 2020). Family: family history includes Breast cancer in her paternal aunt; Lung cancer in her father and mother.  Laboratory Chemistry Profile   Renal Lab Results  Component Value Date   BUN 25 (H) 07/06/2020   CREATININE 0.99 07/06/2020   GFRNONAA 60 (L) 07/06/2020     Hepatic Lab Results  Component Value Date   AST 25 07/02/2020   ALT 21 07/02/2020   ALBUMIN 3.7 07/02/2020   ALKPHOS 80 07/02/2020     Electrolytes Lab Results  Component Value Date   NA 136 07/06/2020   K 4.1 07/06/2020   CL 98 07/06/2020   CALCIUM 9.0 07/06/2020   MG 2.0 07/06/2020   PHOS 3.0 07/06/2020     Bone No results found for: "VD25OH", "VD125OH2TOT", "SQ5834MI1", "VI7125IV1", "25OHVITD1", "25OHVITD2", "25OHVITD3", "TESTOFREE", "TESTOSTERONE"   Inflammation (CRP: Acute Phase) (ESR: Chronic Phase) Lab Results  Component Value Date   LATICACIDVEN 1.1 07/02/2020       Note: Above Lab results reviewed.   Physical Exam  General appearance: Well nourished, well developed, and well hydrated. In no apparent acute distress Mental status: Alert, oriented x 3 (person, place, & time)       Respiratory: No evidence of acute respiratory distress Eyes: PERLA Vitals: BP (!) 141/60 (BP Location: Right Arm, Patient Position: Sitting, Cuff Size: Large)   Pulse 88   Temp 97.8 F (36.6 C) (Temporal)   Resp 16   Ht 5' 5.5" (1.664 m)   Wt 241 lb (109.3 kg)   SpO2 100%   BMI 39.49 kg/m  BMI: Estimated body mass index is 39.49 kg/m as calculated from the following:   Height as of this encounter: 5' 5.5" (1.664 m).   Weight as of this encounter: 241 lb (109.3 kg). Ideal: Ideal body weight: 58.2 kg (128 lb 3.2 oz) Adjusted ideal body  weight: 78.6 kg (173 lb 5.1 oz)  Positive low back pain related to lumbar facet arthropathy, improved after RFA  right hip pain Antalgic gait, presents today in wheelchair  Assessment   Diagnosis  1. Lumbar facet arthropathy   2. Lumbar spondylosis   3. Lumbar degenerative disc disease   4. Chronic pain syndrome        Plan of Care   [name] has a current medication list which includes the following long-term medication(s): albuterol, amantadine, amlodipine, azelastine, carvedilol, donepezil, esomeprazole, levothyroxine, losartan, montelukast, potassium chloride sa, torsemide, trazodone, and venlafaxine hcl.    refill tramadol to 50 to 100 mg twice daily as needed.   Requested Prescriptions   Signed Prescriptions Disp Refills   traMADol (ULTRAM) 50 MG tablet 90 tablet 2    Sig: Take 1-2 tablets (50-100 mg total) by mouth every 12 (twelve) hours as needed for moderate pain or severe pain.   Orders:  No orders of the defined types were placed in this encounter.  Follow-up plan:   Return in about 3 months (around 07/22/2022) for Medication Management, in person.     Status post left L3, L4, L5 RFA 12/01/2017, 12/14/2018, 06/21/2019.  Status post right L3, L4, L5 RFA on 12/22/2017, 12/28/2018, 06/21/2019, 07/10/2019 helps decrease her pain and improve her functional status for axial low back for approximately 6 to 8 months post RFA.  Can repeat every 6 to 12 months.  Left L4 Sprint peripheral nerve stimulation  03/13/2020, lead displacement/migration, lead removed and left L4 medial branch nerve PNS replacement on 05/15/2020, became dislodged again early February.   Right L3, L4, L5 RFA 09/30/2020, left L3, L4, L5 RFA 08/14/2020. Left L3, L4, L5 RFA 02/23/2022, right L3, L4, L5 RFA 01/28/2022     Recent Visits Date Type Provider Dept  02/23/22 Procedure visit Gillis Santa, MD [facility]  01/28/22 Procedure visit Gillis Santa, MD [facility]  Showing  recent visits within past 90 days and meeting all other requirements Today's Visits Date Type Provider Dept  04/21/22 Office Visit Gillis Santa, MD [facility]  Showing today's visits and meeting all other requirements Future Appointments Date Type Provider Dept  07/14/22 Appointment Gillis Santa, MD [facility]  Showing future appointments within next 90 days and meeting all other requirements  I discussed the assessment and treatment plan with the patient. The patient was provided an opportunity to ask questions and all were answered. The patient agreed with the plan and demonstrated an understanding of the instructions.  Patient advised to call back or seek an in-person evaluation if the symptoms or condition worsens.  Duration of encounter:30 minutes.  Note by: Gillis Santa, MD Date: 04/21/2022; Time: 10:58 AM

## 2022-05-19 ENCOUNTER — Encounter: Payer: Medicare Other | Attending: Physician Assistant | Admitting: Physician Assistant

## 2022-05-19 DIAGNOSIS — I5042 Chronic combined systolic (congestive) and diastolic (congestive) heart failure: Secondary | ICD-10-CM | POA: Diagnosis not present

## 2022-05-19 DIAGNOSIS — I87331 Chronic venous hypertension (idiopathic) with ulcer and inflammation of right lower extremity: Secondary | ICD-10-CM | POA: Insufficient documentation

## 2022-05-19 DIAGNOSIS — N183 Chronic kidney disease, stage 3 unspecified: Secondary | ICD-10-CM | POA: Insufficient documentation

## 2022-05-19 DIAGNOSIS — I890 Lymphedema, not elsewhere classified: Secondary | ICD-10-CM | POA: Diagnosis not present

## 2022-05-19 DIAGNOSIS — Z87820 Personal history of traumatic brain injury: Secondary | ICD-10-CM | POA: Insufficient documentation

## 2022-05-19 DIAGNOSIS — L97812 Non-pressure chronic ulcer of other part of right lower leg with fat layer exposed: Secondary | ICD-10-CM | POA: Diagnosis not present

## 2022-05-20 NOTE — Progress Notes (Signed)
[name], [name] ([id number]) [id number] Page 1 of 8 Visit Report for 05/19/2022 Chief Complaint Document Details Patient Name: Date of Service: [name] [name], [name] 05/19/2022 8:45 A M Medical Record Number: [id number] Patient Account Number: [id number] Date of Birth/Sex: Treating RN: [birth year] (76 y.o. [name] Primary Care Provider: Einar [name] Other Clinician: Referring Provider: Treating Provider/Extender: [name] in Treatment: 0 Information Obtained from: Patient Chief Complaint Right LE Ulcer Electronic Signature(s) Signed: 05/19/2022 9:40:27 AM By: Lenda Kelp PA-C Entered By: Lenda Kelp on 05/19/2022 09:40:27 -------------------------------------------------------------------------------- HPI Details Patient Name: Date of Service: [name]. 05/19/2022 8:45 A M Medical Record Number: [id number] Patient Account Number: [id number] Date of Birth/Sex: Treating RN: Nov 07, 1945 (76 y.o. [name] Primary Care Provider: Einar [name] Other Clinician: Referring Provider: Treating Provider/Extender: [name] in Treatment: 0 History of Present Illness HPI Description: 05-19-2022 upon evaluation today patient appears to be doing poorly in regard to wounds of the right lateral lower extremity. The good news is these do not appear to be too significant the bad news is they are somewhat painful. This on 05-13-2022 started more as blisters and have continued to worsen. Fortunately I do not see any signs of severe infection at this point which is good news. Nonetheless I do believe there is some evidence of infection and cellulitis here. Patient does have a history of chronic venous insufficiency, lymphedema, chronic kidney disease stage III, congestive heart failure, and personal history of traumatic brain injury. Electronic  Signature(s) Signed: 05/19/2022 3:51:58 PM By: Lenda Kelp PA-C Entered By: Lenda Kelp on 05/19/2022 15:51:58 [name] ([id number]) [id number] Page 2 of 8 -------------------------------------------------------------------------------- Physical Exam Details Patient Name: Date of Service: [name], [name] 05/19/2022 8:45 A M Medical Record Number: [id number] Patient Account Number: [id number] Date of Birth/Sex: Treating RN: 1945/10/06 (76 y.o. [name] Primary Care Provider: Einar [name] Other Clinician: Referring Provider: Treating Provider/Extender: [name] in Treatment: 0 Constitutional patient is hypertensive.. pulse regular and within target range for patient.Marland Kitchen respirations regular, non-labored and within target range for patient.Marland Kitchen temperature within target range for patient.. Well-nourished and well-hydrated in no acute distress. Eyes conjunctiva clear no eyelid edema noted. pupils equal round and reactive to light and accommodation. Respiratory normal breathing without difficulty. Cardiovascular 2+ dorsalis pedis/posterior tibialis pulses. 1+ pitting edema of the bilateral lower extremities. Musculoskeletal normal gait and posture. no significant deformity or arthritic changes, no loss or range of motion, no clubbing. Psychiatric this patient is able to make decisions and demonstrates good insight into disease process. Alert and Oriented x 3. pleasant and cooperative. Notes Upon inspection patient's wound bed based on what I am seeing does appear to be very superficial though there is a lot of erythema and warmth surrounding. I do believe this represents cellulitis and we are going to need to work on getting this moving in the right direction as far as that is concerned. Fortunately I do not see any signs of active infection systemically nor locally which is also  good news. Electronic Signature(s) Signed: 05/19/2022 3:52:25 PM By: Lenda Kelp PA-C Entered By: Lenda Kelp on 05/19/2022 15:52:25 -------------------------------------------------------------------------------- Physician Orders Details Patient Name: Date of Service: [name]. 05/19/2022 8:45 A M Medical Record Number: [id number] Patient Account Number: [id number] Date of Birth/Sex: Treating RN: 1946/05/30 (76 y.o. F)  [name] Primary Care Provider: Einar [name] Other Clinician: Referring Provider: Treating Provider/Extender: [name] in Treatment: 0 Verbal / Phone Orders: No Diagnosis Coding [name], Robin ([id number]) [id number] Page 3 of 8 ICD-10 Coding Code Description I87.331 Chronic venous hypertension (idiopathic) with ulcer and inflammation of right lower extremity I89.0 Lymphedema, not elsewhere classified L97.812 Non-pressure chronic ulcer of other part of right lower leg with fat layer exposed N18.30 Chronic kidney disease, stage 3 unspecified I50.42 Chronic combined systolic (congestive) and diastolic (congestive) heart failure Z87.820 Personal history of traumatic brain injury Follow-up Appointments Return Appointment in 1 week. Bathing/ Shower/ Hygiene May shower with wound dressing protected with water repellent cover or cast protector. No tub bath. Edema Control - Lymphedema / Segmental Compressive Device / Other Optional: One layer of unna paste to top of compression wrap (to act as an anchor). Elevate, Exercise Daily and A void Standing for Long Periods of Time. Elevate legs to the level of the heart and pump ankles as often as possible Elevate leg(s) parallel to the floor when sitting. Wound Treatment Wound #1 - Lower Leg Wound Laterality: Right, Lateral Prim Dressing: Silvercel Small 2x2 (in/in) 1 x Per Week/30 Days ary Discharge Instructions:  Apply Silvercel Small 2x2 (in/in) as instructed Secondary Dressing: ABD Pad 5x9 (in/in) 1 x Per Week/30 Days Discharge Instructions: Cover with ABD pad Compression Wrap: 3-LAYER WRAP - Profore Lite LF 3 Multilayer Compression Bandaging System 1 x Per Week/30 Days Discharge Instructions: Apply 3 multi-layer wrap as prescribed. Patient Medications llergies: morphine, procaine, codeine, iodine, latex, oxycodone, Sulfa (Sulfonamide Antibiotics), buspirone, gabapentin, hydrocodone A Notifications Medication Indication Start End 05/19/2022 doxycycline hyclate DOSE 1 - oral 100 mg capsule - 1 capsule oral twice a day x 14 days. Do not take magnesium while on this medication Electronic Signature(s) Signed: 05/19/2022 10:05:40 AM By: Lenda Kelp PA-C Entered By: Lenda Kelp on 05/19/2022 10:05:40 -------------------------------------------------------------------------------- Problem List Details Patient Name: Date of Service: [name]. 05/19/2022 8:45 A M Medical Record Number: [id number] Patient Account Number: [id number] Date of Birth/Sex: Treating RN: February 17, 1946 (76 y.o. [name] Primary Care Provider: Einar [name] Other Clinician: Referring Provider: Treating Provider/Extender: [name] in Treatment: 0 Active Problems ADITRI, LOUISCHARLES ([id number]) [id number] Page 4 of 8 ICD-10 Encounter Code Description Active Date MDM Diagnosis I87.331 Chronic venous hypertension (idiopathic) with ulcer and inflammation of right 05/19/2022 No Yes lower extremity I89.0 Lymphedema, not elsewhere classified 05/19/2022 No Yes L97.812 Non-pressure chronic ulcer of other part of right lower leg with fat layer 05/19/2022 No Yes exposed N18.30 Chronic kidney disease, stage 3 unspecified 05/19/2022 No Yes I50.42 Chronic combined systolic (congestive) and diastolic (congestive) heart failure 05/19/2022  No Yes Z87.820 Personal history of traumatic brain injury 05/19/2022 No Yes Inactive Problems Resolved Problems Electronic Signature(s) Signed: 05/19/2022 9:41:07 AM By: Lenda Kelp PA-C Previous Signature: 05/19/2022 9:40:13 AM Version By: Lenda Kelp PA-C Entered By: Lenda Kelp on 05/19/2022 09:41:07 -------------------------------------------------------------------------------- Progress Note Details Patient Name: Date of Service: [name]. 05/19/2022 8:45 A M Medical Record Number: [id number] Patient Account Number: [id number] Date of Birth/Sex: Treating RN: 1946-03-28 (76 y.o. [name] Primary Care Provider: Einar [name] Other Clinician: Referring Provider: Treating Provider/Extender: [name] in Treatment: 0 Subjective Chief Complaint Information obtained from Patient Right LE Ulcer History of Present Illness (HPI) 05-19-2022 upon evaluation today patient appears to be doing  poorly in regard to wounds of the right lateral lower extremity. The good news is these do not appear to be too significant the bad news is they are somewhat painful. This on 05-13-2022 started more as blisters and have continued to worsen. Fortunately I do not see any signs of severe infection at this point which is good news. Nonetheless I do believe there is some evidence of infection and cellulitis here. Patient does have a history of chronic venous insufficiency, lymphedema, chronic kidney disease stage III, congestive heart failure, and personal history of traumatic brain injury. Robin SalisburyHOLLIFIELD [name], [name] ([id number]) [id number] Page 5 of 8 Patient History Allergies morphine, procaine, codeine, iodine, latex, oxycodone, Sulfa (Sulfonamide Antibiotics), buspirone, gabapentin, hydrocodone Social History Never smoker, Marital Status - Married, Alcohol Use - Rarely, Drug Use - No History, Caffeine Use  - Daily. Medical History Cardiovascular Patient has history of Congestive Heart Failure, Hypertension Review of Systems (ROS) Genitourinary CKD 3 Integumentary (Skin) Complains or has symptoms of Wounds, Swelling. Objective Constitutional patient is hypertensive.. pulse regular and within target range for patient.Marland Kitchen. respirations regular, non-labored and within target range for patient.Marland Kitchen. temperature within target range for patient.. Well-nourished and well-hydrated in no acute distress. Vitals Time Taken: 9:04 AM, Height: 66 in, Source: Stated, Weight: 237 lbs, Source: Stated, BMI: 38.2, Temperature: 97.7 F, Pulse: 92 bpm, Respiratory Rate: 18 breaths/min, Blood Pressure: 178/97 mmHg. Eyes conjunctiva clear no eyelid edema noted. pupils equal round and reactive to light and accommodation. Respiratory normal breathing without difficulty. Cardiovascular 2+ dorsalis pedis/posterior tibialis pulses. 1+ pitting edema of the bilateral lower extremities. Musculoskeletal normal gait and posture. no significant deformity or arthritic changes, no loss or range of motion, no clubbing. Psychiatric this patient is able to make decisions and demonstrates good insight into disease process. Alert and Oriented x 3. pleasant and cooperative. General Notes: Upon inspection patient's wound bed based on what I am seeing does appear to be very superficial though there is a lot of erythema and warmth surrounding. I do believe this represents cellulitis and we are going to need to work on getting this moving in the right direction as far as that is concerned. Fortunately I do not see any signs of active infection systemically nor locally which is also good news. Integumentary (Hair, Skin) Wound #1 status is Open. Original cause of wound was Blister. The date acquired was: 05/13/2022. The wound is located on the Right,Lateral Lower Leg. The wound measures 4cm length x 6cm width x 0.1cm depth; 18.85cm^2 area and  1.885cm^3 volume. There is no tunneling or undermining noted. There is a medium amount of serosanguineous drainage noted. There is no granulation within the wound bed. There is a large (67-100%) amount of necrotic tissue within the wound bed including Eschar and Adherent Slough. Assessment Active Problems ICD-10 Chronic venous hypertension (idiopathic) with ulcer and inflammation of right lower extremity Lymphedema, not elsewhere classified Non-pressure chronic ulcer of other part of right lower leg with fat layer exposed Chronic kidney disease, stage 3 unspecified Chronic combined systolic (congestive) and diastolic (congestive) heart failure Personal history of traumatic brain injury Procedures Wound #1 Pre-procedure diagnosis of Wound #1 is a Venous Leg Ulcer located on the Right,Lateral Lower Leg . There was a Three Layer Compression Therapy Procedure by Robin PaxEpps, Carrie, RN. Post procedure Diagnosis Wound #1: Same as Pre-Procedure Robin SalisburyHOLLIFIELD [name], [name] ([id number]) [id number] Page 6 of 8 Plan Follow-up Appointments: Return Appointment in 1 week. Bathing/ Shower/ Hygiene: May shower with wound dressing protected with  water repellent cover or cast protector. No tub bath. Edema Control - Lymphedema / Segmental Compressive Device / Other: Optional: One layer of unna paste to top of compression wrap (to act as an anchor). Elevate, Exercise Daily and Avoid Standing for Long Periods of Time. Elevate legs to the level of the heart and pump ankles as often as possible Elevate leg(s) parallel to the floor when sitting. The following medication(s) was prescribed: doxycycline hyclate oral 100 mg capsule 1 1 capsule oral twice a day x 14 days. Do not take magnesium while on this medication starting 05/19/2022 WOUND #1: - Lower Leg Wound Laterality: Right, Lateral Prim Dressing: Silvercel Small 2x2 (in/in) 1 x Per Week/30 Days ary Discharge Instructions: Apply  Silvercel Small 2x2 (in/in) as instructed Secondary Dressing: ABD Pad 5x9 (in/in) 1 x Per Week/30 Days Discharge Instructions: Cover with ABD pad Com pression Wrap: 3-LAYER WRAP - Profore Lite LF 3 Multilayer Compression Bandaging System 1 x Per Week/30 Days Discharge Instructions: Apply 3 multi-layer wrap as prescribed. 1. Based on what I see I do believe that the patient would benefit currently from initiation of therapy with an antibiotic currently. I am going to suggest that we go ahead and send in doxycycline as this is something she is not allergic to and subsequently also something that is a very good skin antibiotic. 2. I am also can recommend initiation of silver alginate to the wound bed followed by an ABD pad and a 3 layer compression wrap. 3. I would also recommend the patient should continue to monitor for any signs of infection or worsening. Obviously if anything changes she may contact the office and let me know otherwise we will plan to see her in 1 week for follow-up reevaluation. We will see patient back for reevaluation in 1 week here in the clinic. If anything worsens or changes patient will contact our office for additional recommendations. Electronic Signature(s) Signed: 05/19/2022 3:53:14 PM By: Lenda Kelp PA-C Entered By: Lenda Kelp on 05/19/2022 15:53:14 -------------------------------------------------------------------------------- ROS/PFSH Details Patient Name: Date of Service: [name]. 05/19/2022 8:45 A M Medical Record Number: [id number] Patient Account Number: [id number] Date of Birth/Sex: Treating RN: 1946-04-12 (76 y.o. [name] Primary Care Provider: Einar [name] Other Clinician: Referring Provider: Treating Provider/Extender: [name] in Treatment: 0 Integumentary (Skin) Complaints and Symptoms: Positive for: Wounds; Swelling Cardiovascular Medical History: Positive for:  Congestive Heart Failure; Hypertension Genitourinary Complaints and Symptoms: [name], [name] ([id number]) [id number] Page 7 of 8 Review of System Notes: CKD 3 Immunizations Pneumococcal Vaccine: Received Pneumococcal Vaccination: Yes Received Pneumococcal Vaccination On or After 60th Birthday: Yes Implantable Devices None Family and Social History Never smoker; Marital Status - Married; Alcohol Use: Rarely; Drug Use: No History; Caffeine Use: Daily Electronic Signature(s) Signed: 05/19/2022 4:28:12 PM By: Lenda Kelp PA-C Signed: 05/20/2022 7:53:46 AM By: Robin Pax RN Entered By: [name] on 05/19/2022 09:07:04 -------------------------------------------------------------------------------- SuperBill Details Patient Name: Date of Service: [name] 05/19/2022 Medical Record Number: [id number] Patient Account Number: [id number] Date of Birth/Sex: Treating RN: 1945/06/23 (76 y.o. [name] Primary Care Provider: Einar [name] Other Clinician: Referring Provider: Treating Provider/Extender: [name] in Treatment: 0 Diagnosis Coding ICD-10 Codes Code Description [phone] Chronic venous hypertension (idiopathic) with ulcer and inflammation of right lower extremity I89.0 Lymphedema, not elsewhere classified L97.812 Non-pressure chronic ulcer of other part of right lower leg with fat layer exposed  N18.30 Chronic kidney disease, stage 3 unspecified I50.42 Chronic combined systolic (congestive) and diastolic (congestive) heart failure Z87.820 Personal history of traumatic brain injury Facility Procedures : CPT4 Code: [id number] Description: 99213 - WOUND CARE VISIT-LEV 3 EST PT Modifier: Quantity: 1 Physician Procedures : CPT4 Code Description Modifier [id number] - WC PHYS LEVEL 4 - NEW PT ICD-10 Diagnosis Description I87.331 Chronic venous hypertension (idiopathic)  with ulcer and inflammation of right lower extremity I89.0 Lymphedema, not elsewhere classified  L97.812 Non-pressure chronic ulcer of other part of right lower leg with fat layer exposed N18.30 Chronic kidney disease, stage 3 unspecified Quantity: 1 Electronic Signature(s) Signed: 05/19/2022 3:53:29 PM By: Grant Ruts, Alisabeth [name] ([phone] Schwartz PA-C [phone].pdf Page 8 of 8 Signed: 05/19/2022 3:53:29 PM By: Linwood Dibbles, Entered By: Lenda Kelp on 05/19/2022 15:53:28

## 2022-05-20 NOTE — Progress Notes (Signed)
[name], [name] ([id number]) [id number] Page 1 of 9 Visit Report for 05/19/2022 Allergy List Details Patient Name: Date of Service: [name], [name]. 05/19/2022 8:45 A M Medical Record Number: [id number] Patient Account Number: [id number] Date of Birth/Sex: Treating RN: 09/28/1945 (76 y.o. [name]) [name] Primary Care [name]: [name] Other Clinician: Referring [name]: Treating Thaily Hackworth/Extender: [name] [name] in Treatment: 0 Allergies Active Allergies morphine procaine codeine iodine latex oxycodone Sulfa (Sulfonamide Antibiotics) buspirone gabapentin hydrocodone Allergy Notes Electronic Signature(s) Signed: 05/20/2022 7:53:46 AM By: Yevonne PaxEpps, Carrie RN Entered By: Yevonne PaxEpps, [name] on 05/19/2022 09:23:11 -------------------------------------------------------------------------------- Arrival Information Details Patient Name: Date of Service: [name], [name]. 05/19/2022 8:45 A M Medical Record Number: [id number] Patient Account Number: [id number] Date of Birth/Sex: Treating RN: 04/08/1946 (76 y.o. [name]) [name] Primary Care Colburn Asper: [name] Other Clinician: Referring [name]: Treating [name]/Extender: [name] AssumptionHOLLIFIELD [name], [name] ([id number]) [id number] Page 2 of 9 [name] in Treatment: 0 Visit Information Patient Arrived: Wheel Chair Arrival Time: 09:03 Accompanied By: self Transfer Assistance: None Patient Identification Verified: Yes Secondary Verification Process Completed: Yes Patient Requires Transmission-Based Precautions: No Patient Has Alerts: No Electronic Signature(s) Signed: 05/20/2022 7:53:46 AM By: Yevonne PaxEpps, Carrie RN Entered By: Yevonne PaxEpps, [name] on 05/19/2022 09:04:04 -------------------------------------------------------------------------------- Clinic Level of Care Assessment  Details Patient Name: Date of Service: [name], [name]. 05/19/2022 8:45 A M Medical Record Number: [id number] Patient Account Number: [id number] Date of Birth/Sex: Treating RN: 03/12/1946 (76 y.o. [name]) [name] Primary Care Tiny Chaudhary: [name] Other Clinician: Referring Kamaree Berkel: Treating Willodean Leven/Extender: [name] [name] in Treatment: 0 Clinic Level of Care Assessment Items TOOL 1 Quantity Score X- 1 0 Use when EandM and Procedure is performed on INITIAL visit ASSESSMENTS - Nursing Assessment / Reassessment X- 1 20 General Physical Exam (combine w/ comprehensive assessment (listed just below) when performed on new pt. evals) X- 1 25 Comprehensive Assessment (HX, ROS, Risk Assessments, Wounds Hx, etc.) ASSESSMENTS - Wound and Skin Assessment / Reassessment []  - 0 Dermatologic / Skin Assessment (not related to wound area) ASSESSMENTS - Ostomy and/or Continence Assessment and Care []  - 0 Incontinence Assessment and Management []  - 0 Ostomy Care Assessment and Management (repouching, etc.) PROCESS - Coordination of Care X - Simple Patient / Family Education for ongoing care 1 15 []  - 0 Complex (extensive) Patient / Family Education for ongoing care X- 1 10 Staff obtains Consents, Records, T Results / Process Orders est []  - 0 Staff telephones HHA, Nursing Homes / Clarify orders / etc []  - 0 Routine Transfer to another Facility (non-emergent condition) []  - 0 Routine Hospital Admission (non-emergent condition) X- 1 15 New Admissions / Manufacturing engine[employer] Authorizations / Ordering NPWT Apligraf, etc. , []  - 0 Emergency Hospital Admission (emergent condition) PROCESS - Special Needs []  - 0 Pediatric / Minor Patient Management []  - 0 Isolation Patient Management [name], [name] ([id number]) [id number] Page 3 of 9 []  - 0 Hearing / Language / Visual special needs []  - 0 Assessment of Community  assistance (transportation, D/C planning, etc.) []  - 0 Additional assistance / Altered mentation []  - 0 Support Surface(s) Assessment (bed, cushion, seat, etc.) INTERVENTIONS - Miscellaneous []  - 0 External ear exam []  - 0 Patient Transfer (multiple staff / Nurse, adultHoyer Lift / Similar devices) []  - 0 Simple Staple / Suture removal (25 or less) []  - 0 Complex Staple / Suture removal (26 or more) []  - 0  Hypo/Hyperglycemic Management (do not check if billed separately) X- 1 15 Ankle / Brachial Index (ABI) - do not check if billed separately Has the patient been seen at the hospital within the last three years: Yes Total Score: 100 Level Of Care: New/Established - Level 3 Electronic Signature(s) Signed: 05/20/2022 7:53:46 AM By: Yevonne Pax RN Entered By: Yevonne Pax on 05/19/2022 09:46:07 -------------------------------------------------------------------------------- Compression Therapy Details Patient Name: Date of Service: Robin LLIFIELD RO GO MERRIAM, [name] 05/19/2022 8:45 A M Medical Record Number: [id number] Patient Account Number: [id number] Date of Birth/Sex: Treating RN: March 07, 1946 (76 y.o. Robin Finner Primary Care Nusaiba Guallpa: Einar Crow Other Clinician: Referring Tyton Abdallah: Treating Khristian Phillippi/Extender: Robin Shears in Treatment: 0 Compression Therapy Performed for Wound Assessment: Wound #1 Right,Lateral Lower Leg Performed By: Clinician Yevonne Pax, RN Compression Type: Three Layer Post Procedure Diagnosis Same as Pre-procedure Electronic Signature(s) Signed: 05/20/2022 7:53:46 AM By: Yevonne Pax RN Entered By: Yevonne Pax on 05/19/2022 09:46:30 [name] ([id number]) [id number] Page 4 of 9 -------------------------------------------------------------------------------- Encounter Discharge Information Details Patient Name: Date of Service: [name], [name] 05/19/2022 8:45 A  M Medical Record Number: [id number] Patient Account Number: [id number] Date of Birth/Sex: Treating RN: Jan 29, 1946 (76 y.o. Robin Finner Primary Care Braycen Burandt: Robin Crow Other Clinician: Referring Natelie Ostrosky: Treating Luqman Perrelli/Extender: Robin Shears in Treatment: 0 Encounter Discharge Information Items Discharge Condition: Stable Ambulatory Status: Wheelchair Discharge Destination: Home Transportation: Private Auto Accompanied By: self Schedule Follow-up Appointment: Yes Clinical Summary of Care: Electronic Signature(s) Signed: 05/20/2022 7:53:46 AM By: Yevonne Pax RN Entered By: Yevonne Pax on 05/19/2022 09:49:35 -------------------------------------------------------------------------------- Lower Extremity Assessment Details Patient Name: Date of Service: [name], SMIGELSKI 05/19/2022 8:45 A M Medical Record Number: [id number] Patient Account Number: [id number] Date of Birth/Sex: Treating RN: 03-13-1946 (76 y.o. Robin Finner Primary Care Abygale Karpf: Robin Crow Other Clinician: Referring Donnald Tabar: Treating Lydiana Milley/Extender: Robin Shears in Treatment: 0 Edema Assessment Assessed: [Left: No] [Right: Yes] Edema: [Left: Ye] [Right: s] Calf Left: Right: Point of Measurement: 32 cm From Medial Instep 36 cm Ankle Left: Right: Point of Measurement: 10 cm From Medial Instep 21 cm Knee To Floor Left: Right: From Medial Instep 41 cm Vascular Assessment Pulses: Dorsalis Pedis Palpable: [Right:Yes] Doppler Audible: [Right:Yes] Blood Pressure: Brachial: [Right:178] Ankle: [Right:Dorsalis Pedis: 160] Ankle Brachial Index: [Right:0.90 [id number] Page 5 of 9] Electronic Signature(s) Signed: 05/20/2022 7:53:46 AM By: Yevonne Pax RN Entered By: Yevonne Pax on 05/19/2022 09:18:17 -------------------------------------------------------------------------------- Multi Wound  Chart Details Patient Name: Date of Service: Robin [name] 05/19/2022 8:45 A M Medical Record Number: [id number] Patient Account Number: [id number] Date of Birth/Sex: Treating RN: 1945-12-12 (76 y.o. Robin Finner Primary Care Paiton Fosco: Robin Crow Other Clinician: Referring Princessa Lesmeister: Treating Shelma Eiben/Extender: Robin Shears in Treatment: 0 Vital Signs Height(in): 66 Pulse(bpm): 92 Weight(lbs): 237 Blood Pressure(mmHg): 178/97 Body Mass Index(BMI): 38.2 Temperature(F): 97.7 Respiratory Rate(breaths/min): 18 [1:Photos:] [N/A:N/A] Right, Lateral Lower Leg N/A N/A Wound Location: Blister N/A N/A Wounding Event: Venous Leg Ulcer N/A N/A Primary Etiology: Congestive Heart Failure, N/A N/A Comorbid History: Hypertension 05/13/2022 N/A N/A Date Acquired: 0 N/A N/A [name] of Treatment: Open N/A N/A Wound Status: No N/A N/A Wound Recurrence: 4x6x0.1 N/A N/A Measurements L x W x D (cm) 18.85 N/A N/A A (cm) : rea 1.885 N/A N/A Volume (cm) : Partial Thickness N/A N/A Classification: Medium N/A N/A Exudate A mount: Serosanguineous N/A N/A Exudate  Type: red, brown N/A N/A Exudate Color: None Present (0%) N/A N/A Granulation A mount: Large (67-100%) N/A N/A Necrotic A mount: Eschar, Adherent Slough N/A N/A Necrotic Tissue: Fascia: No N/A N/A Exposed Structures: Fat Layer (Subcutaneous Tissue): No Tendon: No Muscle: No Joint: No Bone: No Medium (34-66%) N/A N/A Epithelialization: Treatment Notes Electronic Signature(s) AVAH, BASHOR ([id number]) [id number] Page 6 of 9 Signed: 05/20/2022 7:53:46 AM By: Yevonne Pax RN Entered By: Yevonne Pax on 05/19/2022 09:42:36 -------------------------------------------------------------------------------- Multi-Disciplinary Care Plan Details Patient Name: Date of Service: Robin LLIFIELD RO GO TEREASA, YILMAZ 05/19/2022 8:45 A  M Medical Record Number: [id number] Patient Account Number: [id number] Date of Birth/Sex: Treating RN: 1946-03-18 (76 y.o. Robin Finner Primary Care Dashiel Bergquist: Robin Crow Other Clinician: Referring Kveon Casanas: Treating Tiki Tucciarone/Extender: Robin Shears in Treatment: 0 Active Inactive Necrotic Tissue Nursing Diagnoses: Knowledge deficit related to management of necrotic/devitalized tissue Goals: Necrotic/devitalized tissue will be minimized in the wound bed Date Initiated: 05/19/2022 Target Resolution Date: 06/19/2022 Goal Status: Active Interventions: Assess patient pain level pre-, during and post procedure and prior to discharge Provide education on necrotic tissue and debridement process Treatment Activities: Biologic debridement : 05/19/2022 Excisional debridement : 05/19/2022 Notes: Wound/Skin Impairment Nursing Diagnoses: Knowledge deficit related to ulceration/compromised skin integrity Goals: Patient/caregiver will verbalize understanding of skin care regimen Date Initiated: 05/19/2022 Target Resolution Date: 06/19/2022 Goal Status: Active Ulcer/skin breakdown will have a volume reduction of 30% by week 4 Date Initiated: 05/19/2022 Target Resolution Date: 06/19/2022 Goal Status: Active Ulcer/skin breakdown will have a volume reduction of 50% by week 8 Date Initiated: 05/19/2022 Target Resolution Date: 07/20/2022 Goal Status: Active Ulcer/skin breakdown will have a volume reduction of 80% by week 12 Date Initiated: 05/19/2022 Target Resolution Date: 08/18/2022 Goal Status: Active Ulcer/skin breakdown will heal within 14 [name] Date Initiated: 05/19/2022 Target Resolution Date: 09/18/2022 Goal Status: Active Interventions: Assess patient/caregiver ability to obtain necessary supplies Assess patient/caregiver ability to perform ulcer/skin care regimen upon admission and as needed Assess ulceration(s) every visit ADRAIN, BUTRICK  ([id number]) [id number] Page 7 of 9 Notes: Electronic Signature(s) Signed: 05/20/2022 7:53:46 AM By: Yevonne Pax RN Entered By: Yevonne Pax on 05/19/2022 09:48:53 -------------------------------------------------------------------------------- Pain Assessment Details Patient Name: Date of Service: Robin LLIFIELD RO GO FATEMAH, POURCIAU 05/19/2022 8:45 A M Medical Record Number: [id number] Patient Account Number: [id number] Date of Birth/Sex: Treating RN: 11/28/1945 (76 y.o. Robin Finner Primary Care Endora Teresi: Robin Crow Other Clinician: Referring Cariah Salatino: Treating Jemmie Rhinehart/Extender: Robin Shears in Treatment: 0 Active Problems Location of Pain Severity and Description of Pain Patient Has Paino No Site Locations Pain Management and Medication Current Pain Management: Electronic Signature(s) Signed: 05/20/2022 7:53:46 AM By: Yevonne Pax RN Entered By: Yevonne Pax on 05/19/2022 09:04:25 [name] ([id number]) [id number] Page 8 of 9 -------------------------------------------------------------------------------- Patient/Caregiver Education Details Patient Name: Date of Service: Robin LLIFIELD RO GO LIGAYA, CORMIER 12/5/2023andnbsp8:45 A M Medical Record Number: [id number] Patient Account Number: [id number] Date of Birth/Gender: Treating RN: May 05, 1946 (76 y.o. Robin Finner Primary Care Physician: Robin Crow Other Clinician: Referring Physician: Treating Physician/Extender: Robin Shears in Treatment: 0 Education Assessment Education Provided To: Patient Education Topics Provided Wound/Skin Impairment: Methods: Explain/Verbal Responses: State content correctly Electronic Signature(s) Signed: 05/20/2022 7:53:46 AM By: Yevonne Pax RN Entered By: Yevonne Pax on 05/19/2022  09:46:55 -------------------------------------------------------------------------------- Wound Assessment Details Patient Name: Date of Service: Robin LLIFIELD RO GO DAFFNEY, GREENLY 05/19/2022 8:45 A M Medical Record Number: [id number]  Patient Account Number: [id number] Date of Birth/Sex: Treating RN: 22-Aug-1945 (76 y.o. Robin Finner Primary Care Raynaldo Falco: Robin Crow Other Clinician: Referring Mir Fullilove: Treating Chuong Casebeer/Extender: Robin Shears in Treatment: 0 Wound Status Wound Number: 1 Primary Etiology: Venous Leg Ulcer Wound Location: Right, Lateral Lower Leg Wound Status: Open Wounding Event: Blister Comorbid History: Congestive Heart Failure, Hypertension Date Acquired: 05/13/2022 [name] Of Treatment: 0 Clustered Wound: No Photos Wound Measurements Length: (cm) 4 Width: (cm) 6 Robin RAUL, WINTERHALTER ([id number]) Depth: (cm) 0.1 Area: (cm) 18 Volume: (cm) 1. % Reduction in Area: % Reduction in Volume: [id number] Page 9 of 9 Epithelialization: Medium (34-66%) .85 Tunneling: No 885 Undermining: No Wound Description Classification: Partial Thickness Exudate Amount: Medium Exudate Type: Serosanguineous Exudate Color: red, brown Foul Odor After Cleansing: No Slough/Fibrino Yes Wound Bed Granulation Amount: None Present (0%) Exposed Structure Necrotic Amount: Large (67-100%) Fascia Exposed: No Necrotic Quality: Eschar, Adherent Slough Fat Layer (Subcutaneous Tissue) Exposed: No Tendon Exposed: No Muscle Exposed: No Joint Exposed: No Bone Exposed: No Electronic Signature(s) Signed: 05/20/2022 7:53:46 AM By: Yevonne Pax RN Entered By: Yevonne Pax on 05/19/2022 09:20:03 -------------------------------------------------------------------------------- Vitals Details Patient Name: Date of Service: Robin [name] 05/19/2022 8:45 A M Medical Record Number: [id number] Patient Account  Number: [id number] Date of Birth/Sex: Treating RN: 1946/04/17 (76 y.o. Robin Finner Primary Care Konnor Vondrasek: Robin Crow Other Clinician: Referring Sylvester Minton: Treating Ruslan Mccabe/Extender: Robin Shears in Treatment: 0 Vital Signs Time Taken: 09:04 Temperature (F): 97.7 Height (in): 66 Pulse (bpm): 92 Source: Stated Respiratory Rate (breaths/min): 18 Weight (lbs): 237 Blood Pressure (mmHg): 178/97 Source: Stated Reference Range: 80 - 120 mg / dl Body Mass Index (BMI): 38.2 Electronic Signature(s) Signed: 05/20/2022 7:53:46 AM By: Yevonne Pax RN Entered By: Yevonne Pax on 05/19/2022 09:04:57

## 2022-05-20 NOTE — Progress Notes (Signed)
[name] ([id number]) [phone] [id number].pdf Page 1 of 5 Visit Report for 05/19/2022 Abuse Risk Screen Details Patient Name: Date of Service: [name], [name] 05/19/2022 8:45 A M Medical Record Number: [id number] Patient Account Number: [id number] Date of Birth/Sex: Treating RN: 02-06-1946 (76 y.o. Freddy Finner Primary Care Leylanie Woodmansee: Einar Crow Other Clinician: Referring [place] Spainhower: Treating Thurl Boen/Extender: Johny Shears in Treatment: 0 Abuse Risk Screen Items Answer ABUSE RISK SCREEN: Has anyone close to you tried to hurt or harm you recentlyo No Do you feel uncomfortable with anyone in your familyo No Has anyone forced you do things that you didnt want to doo No Electronic Signature(s) Signed: 05/20/2022 7:53:46 AM By: Yevonne Pax RN Entered By: Yevonne Pax on 05/19/2022 09:07:11 -------------------------------------------------------------------------------- Activities of Daily Living Details Patient Name: Date of Service: [name], [name] 05/19/2022 8:45 A M Medical Record Number: [id number] Patient Account Number: [id number] Date of Birth/Sex: Treating RN: 17-Jan-1946 (76 y.o. Freddy Finner Primary Care Almas Rake: Einar Crow Other Clinician: Referring Vergene Marland: Treating Ameyah Bangura/Extender: Johny Shears in Treatment: 0 Activities of Daily Living Items Answer Activities of Daily Living (Please select one for each item) Drive Automobile Completely Able T Medications ake Completely Able Use T elephone Completely Able Care for Appearance Completely Able Use T oilet Completely Able Bath / Shower Completely Able Dress Self Completely Able Feed Self Completely Able Walk Completely Able Get In / Out Bed Completely Able Housework Need Assistance [name] ([id number]) [phone] [id number].pdf Page  2 of 5 Prepare Meals Need Assistance Handle Money Completely Able Shop for Self Completely Able Electronic Signature(s) Signed: 05/20/2022 7:53:46 AM By: Yevonne Pax RN Entered By: Yevonne Pax on 05/19/2022 09:07:48 -------------------------------------------------------------------------------- Education Screening Details Patient Name: Date of Service: [name] [name]. 05/19/2022 8:45 A M Medical Record Number: [id number] Patient Account Number: [id number] Date of Birth/Sex: Treating RN: [birth year] (76 y.o. Freddy Finner Primary Care Mariane Burpee: Einar Crow Other Clinician: Referring Julisa Flippo: Treating Kynisha Memon/Extender: Johny Shears in Treatment: 0 Primary Learner Assessed: Patient Learning Preferences/Education Level/Primary Language Learning Preference: Explanation Highest Education Level: College or Above Preferred Language: English Cognitive Barrier Language Barrier: No Translator Needed: No Memory Deficit: No Emotional Barrier: No Physical Barrier Impaired Vision: No Impaired Hearing: No Decreased Hand dexterity: No Knowledge/Comprehension Knowledge Level: Medium Comprehension Level: High Ability to understand written instructions: High Ability to understand verbal instructions: High Motivation Anxiety Level: Anxious Cooperation: Cooperative Education Importance: Acknowledges Need Interest in Health Problems: Asks Questions Perception: Coherent Willingness to Engage in Self-Management Medium Activities: Readiness to Engage in Self-Management Medium Activities: Electronic Signature(s) Signed: 05/20/2022 7:53:46 AM By: Yevonne Pax RN Entered By: Yevonne Pax on 05/19/2022 09:08:16 [name] ([id number]) [id number] Page 3 of 5 -------------------------------------------------------------------------------- Fall Risk Assessment Details Patient Name: Date of  Service: [name], [name] 05/19/2022 8:45 A M Medical Record Number: [id number] Patient Account Number: [id number] Date of Birth/Sex: Treating RN: 07-12-1945 (76 y.o. Freddy Finner Primary Care Niya Behler: Einar Crow Other Clinician: Referring Iyonna Rish: Treating Zamorah Ailes/Extender: Johny Shears in Treatment: 0 Fall Risk Assessment Items Have you had 2 or more falls in the last 12 monthso 0 No Have you had any fall that resulted in injury in the last 12 monthso 0 No FALLS RISK SCREEN History of falling - immediate or within 3 months 0 No Secondary diagnosis (Do you have 2 or  more medical diagnoseso) 0 No Ambulatory aid None/bed rest/wheelchair/nurse 0 No Crutches/cane/walker 0 No Furniture 0 No Intravenous therapy Access/Saline/Heparin Lock 0 No Gait/Transferring Normal/ bed rest/ wheelchair 0 No Weak (short steps with or without shuffle, stooped but able to lift head while walking, may seek 0 No support from furniture) Impaired (short steps with shuffle, may have difficulty arising from chair, head down, impaired 0 No balance) Mental Status Oriented to own ability 0 No Electronic Signature(s) Signed: 05/20/2022 7:53:46 AM By: Yevonne Pax RN Entered By: Yevonne Pax on 05/19/2022 09:08:33 -------------------------------------------------------------------------------- Foot Assessment Details Patient Name: Date of Service: [name] [name]. 05/19/2022 8:45 A M Medical Record Number: [id number] Patient Account Number: [id number] Date of Birth/Sex: Treating RN: 1945-10-28 (76 y.o. Freddy Finner Primary Care Vanassa Penniman: Einar Crow Other Clinician: Referring Mekhi Lascola: Treating Monda Chastain/Extender: Johny Shears in Treatment: 0 Foot Assessment Items Site Locations [name], [name] ([id number]) [phone] [id number] Page 4 of 5 + = Sensation present, - =  Sensation absent, C = Callus, U = Ulcer R = Redness, W = Warmth, M = Maceration, PU = Pre-ulcerative lesion F = Fissure, S = Swelling, D = Dryness Assessment Right: Left: Other Deformity: No No Prior Foot Ulcer: No No Prior Amputation: No No Charcot Joint: No No Ambulatory Status: Ambulatory Without Help Gait: Steady Electronic Signature(s) Signed: 05/20/2022 7:53:46 AM By: Yevonne Pax RN Entered By: Yevonne Pax on 05/19/2022 09:17:31 -------------------------------------------------------------------------------- Nutrition Risk Screening Details Patient Name: Date of Service: [name], [name] 05/19/2022 8:45 A M Medical Record Number: [id number] Patient Account Number: [id number] Date of Birth/Sex: Treating RN: 1945-08-06 (76 y.o. Freddy Finner Primary Care Celine Dishman: Einar Crow Other Clinician: Referring Emelina Hinch: Treating Loanne Emery/Extender: Johny Shears in Treatment: 0 Height (in): 66 Weight (lbs): 237 Body Mass Index (BMI): 38.2 Nutrition Risk Screening Items Score Screening NUTRITION RISK SCREEN: I have an illness or condition that made me change the kind and/or amount of food I eat 0 No I eat fewer than two meals per day 0 No I eat few fruits and vegetables, or milk products 0 No I have three or more drinks of beer, liquor or wine almost every day 0 No I have tooth or mouth problems that make it hard for me to eat 0 No I don't always have enough money to buy the food I need 0 No SACHI, BOULAY ([id number]) [phone] [id number].pdf Page 5 of 5 I eat alone most of the time 0 No I take three or more different prescribed or over-the-counter drugs a day 1 Yes Without wanting to, I have lost or gained 10 pounds in the last six months 0 No I am not always physically able to shop, cook and/or feed myself 2 Yes Nutrition Protocols Good Risk Protocol Moderate Risk Protocol 0 Provide education  on nutrition High Risk Proctocol Risk Level: Moderate Risk Score: 3 Electronic Signature(s) Signed: 05/20/2022 7:53:46 AM By: Yevonne Pax RN Entered By: Yevonne Pax on 05/19/2022 09:08:58

## 2022-05-26 ENCOUNTER — Encounter: Payer: Medicare Other | Admitting: Physician Assistant

## 2022-05-26 DIAGNOSIS — I87331 Chronic venous hypertension (idiopathic) with ulcer and inflammation of right lower extremity: Secondary | ICD-10-CM | POA: Diagnosis not present

## 2022-05-26 NOTE — Progress Notes (Addendum)
CATHEY, FREDENBURG ([id number]) [id number] Page 1 of 6 Visit Report for 05/26/2022 Chief Complaint Document Details Patient Name: Date of Service: [name] 05/26/2022 11:15 A M Medical Record Number: [id number] Patient Account Number: [id number] Date of Birth/Sex: Treating RN: 1946/01/07 (76 y.o. Freddy Finner Primary Care Provider: Einar Crow Other Clinician: Betha Loa Referring Provider: Treating Provider/Extender: Johny Shears in Treatment: 1 Information Obtained from: Patient Chief Complaint Right LE Ulcer Electronic Signature(s) Signed: 05/26/2022 11:46:28 AM By: Lenda Kelp PA-C Entered By: Lenda Kelp on 05/26/2022 11:46:28 -------------------------------------------------------------------------------- HPI Details Patient Name: Date of Service: Robin LLIFIELD RO GO [name] 05/26/2022 11:15 A M Medical Record Number: [id number] Patient Account Number: [id number] Date of Birth/Sex: Treating RN: 1945-07-23 (76 y.o. Freddy Finner Primary Care Provider: Einar Crow Other Clinician: Betha Loa Referring Provider: Treating Provider/Extender: Johny Shears in Treatment: 1 History of Present Illness HPI Description: 05-19-2022 upon evaluation today patient appears to be doing poorly in regard to wounds of the right lateral lower extremity. The good news is these do not appear to be too significant the bad news is they are somewhat painful. This on 05-13-2022 started more as blisters and have continued to worsen. Fortunately I do not see any signs of severe infection at this point which is good news. Nonetheless I do believe there is some evidence of infection and cellulitis here. Patient does have a history of chronic venous insufficiency, lymphedema, chronic kidney disease stage III, congestive heart failure, and personal history  of traumatic brain injury. 05-26-2022 upon evaluation today patient actually appears to be doing much better in regard to her leg ulcer. She has been tolerating the dressing changes without complication and overall I am actually extremely pleased with where we stand today. Fortunately there does not appear to be any signs of infection locally nor systemically at this point I do believe the antibiotics have been beneficial. Electronic Signature(s) Signed: 05/26/2022 11:53:23 AM By: Grant Ruts, Olney [name] ([id number]) [id number] Page 2 of 6 Entered By: Lenda Kelp on 05/26/2022 11:53:22 -------------------------------------------------------------------------------- Physical Exam Details Patient Name: Date of Service: Robin LLIFIELD RO GO [name] 05/26/2022 11:15 A M Medical Record Number: [id number] Patient Account Number: [id number] Date of Birth/Sex: Treating RN: 1946/01/09 (76 y.o. Freddy Finner Primary Care Provider: Einar Crow Other Clinician: Betha Loa Referring Provider: Treating Provider/Extender: Johny Shears in Treatment: 1 Constitutional Well-nourished and well-hydrated in no acute distress. Respiratory normal breathing without difficulty. Psychiatric this patient is able to make decisions and demonstrates good insight into disease process. Alert and Oriented x 3. pleasant and cooperative. Notes Upon inspection patient's wound bed actually showed signs of good granulation epithelization at this point. Fortunately I do not see any evidence of active infection and I feel like that compression wrap did also job at helping to keep her swelling under control. Electronic Signature(s) Signed: 05/26/2022 11:53:40 AM By: Lenda Kelp PA-C Entered By: Lenda Kelp on 05/26/2022  11:53:40 -------------------------------------------------------------------------------- Physician Orders Details Patient Name: Date of Service: Robin LLIFIELD RO GO [name]. 05/26/2022 11:15 A M Medical Record Number: [id number] Patient Account Number: [id number] Date of Birth/Sex: Treating RN: [birth year] (76 y.o. Freddy Finner Primary Care Provider: Einar Crow Other Clinician: Betha Loa Referring Provider: Treating Provider/Extender: Johny Shears in Treatment: 1 Verbal / Phone Orders: No Diagnosis Coding ICD-10 Coding Code Description [phone]  Chronic venous hypertension (idiopathic) with ulcer and inflammation of right lower extremity I89.0 Lymphedema, not elsewhere classified L97.812 Non-pressure chronic ulcer of other part of right lower leg with fat layer exposed N18.30 Chronic kidney disease, stage 3 unspecified [name], [name] ([id number]) [id number] Page 3 of 6 I50.42 Chronic combined systolic (congestive) and diastolic (congestive) heart failure Z87.820 Personal history of traumatic brain injury Follow-up Appointments Return Appointment in 1 week. Bathing/ Shower/ Hygiene May shower with wound dressing protected with water repellent cover or cast protector. No tub bath. Edema Control - Lymphedema / Segmental Compressive Device / Other Optional: One layer of unna paste to top of compression wrap (to act as an anchor). 3 Layer Compression System for Lymphedema. - right lower leg Elevate, Exercise Daily and A void Standing for Long Periods of Time. Elevate legs to the level of the heart and pump ankles as often as possible Elevate leg(s) parallel to the floor when sitting. Wound Treatment Wound #1 - Lower Leg Wound Laterality: Right, Lateral Prim Dressing: Silvercel Small 2x2 (in/in) 1 x Per Week/30 Days ary Discharge Instructions: Apply Silvercel Small 2x2 (in/in) as  instructed Secondary Dressing: ABD Pad 5x9 (in/in) 1 x Per Week/30 Days Discharge Instructions: Cover with ABD pad Compression Wrap: 3-LAYER WRAP - Profore Lite LF 3 Multilayer Compression Bandaging System 1 x Per Week/30 Days Discharge Instructions: Apply 3 multi-layer wrap as prescribed. Compression Stockings: Circaid Juxta Lite Compression Wrap (DME) Right Leg Compression Amount: 20-30 mmHG Discharge Instructions: Apply Circaid Juxta Lite Compression Wrap as directed Electronic Signature(s) Signed: 05/26/2022 5:45:58 PM By: Lenda Kelp PA-C Signed: 06/02/2022 4:45:28 PM By: Betha Loa Entered By: Betha Loa on 05/26/2022 13:28:29 -------------------------------------------------------------------------------- Problem List Details Patient Name: Date of Service: Robin LLIFIELD RO GO [name]. 05/26/2022 11:15 A M Medical Record Number: [id number] Patient Account Number: [id number] Date of Birth/Sex: Treating RN: 1945/08/20 (76 y.o. Freddy Finner Primary Care Provider: Einar Crow Other Clinician: Betha Loa Referring Provider: Treating Provider/Extender: Johny Shears in Treatment: 1 Active Problems ICD-10 Encounter Code Description Active Date MDM Diagnosis I87.331 Chronic venous hypertension (idiopathic) with ulcer and inflammation of right 05/19/2022 No Yes lower extremity I89.0 Lymphedema, not elsewhere classified 05/19/2022 No Yes TAWANNA, FUNK ([id number]) [id number] Page 4 of 6 [phone] Non-pressure chronic ulcer of other part of right lower leg with fat layer 05/19/2022 No Yes exposed N18.30 Chronic kidney disease, stage 3 unspecified 05/19/2022 No Yes I50.42 Chronic combined systolic (congestive) and diastolic (congestive) heart failure 05/19/2022 No Yes Z87.820 Personal history of traumatic brain injury 05/19/2022 No Yes Inactive Problems Resolved Problems Electronic  Signature(s) Signed: 05/26/2022 11:46:24 AM By: Lenda Kelp PA-C Entered By: Lenda Kelp on 05/26/2022 11:46:24 -------------------------------------------------------------------------------- Progress Note Details Patient Name: Date of Service: Robin LLIFIELD RO GO [name]. 05/26/2022 11:15 A M Medical Record Number: [id number] Patient Account Number: [id number] Date of Birth/Sex: Treating RN: 1946-02-09 (76 y.o. Freddy Finner Primary Care Provider: Einar Crow Other Clinician: Betha Loa Referring Provider: Treating Provider/Extender: Johny Shears in Treatment: 1 Subjective Chief Complaint Information obtained from Patient Right LE Ulcer History of Present Illness (HPI) 05-19-2022 upon evaluation today patient appears to be doing poorly in regard to wounds of the right lateral lower extremity. The good news is these do not appear to be too significant the bad news is they are somewhat painful. This on 05-13-2022 started more as blisters and have continued to worsen. Fortunately I do not see any  signs of severe infection at this point which is good news. Nonetheless I do believe there is some evidence of infection and cellulitis here. Patient does have a history of chronic venous insufficiency, lymphedema, chronic kidney disease stage III, congestive heart failure, and personal history of traumatic brain injury. 05-26-2022 upon evaluation today patient actually appears to be doing much better in regard to her leg ulcer. She has been tolerating the dressing changes without complication and overall I am actually extremely pleased with where we stand today. Fortunately there does not appear to be any signs of infection locally nor systemically at this point I do believe the antibiotics have been beneficial. Objective Constitutional [name], [name] ([id number]) [id number] Page 5 of 6 Well-nourished  and well-hydrated in no acute distress. Vitals Time Taken: 11:30 AM, Height: 66 in, Weight: 237 lbs, BMI: 38.2, Temperature: 98.2 F, Pulse: 94 bpm, Respiratory Rate: 18 breaths/min, Blood Pressure: 169/89 mmHg. Respiratory normal breathing without difficulty. Psychiatric this patient is able to make decisions and demonstrates good insight into disease process. Alert and Oriented x 3. pleasant and cooperative. General Notes: Upon inspection patient's wound bed actually showed signs of good granulation epithelization at this point. Fortunately I do not see any evidence of active infection and I feel like that compression wrap did also job at helping to keep her swelling under control. Integumentary (Hair, Skin) Wound #1 status is Open. Original cause of wound was Blister. The date acquired was: 05/13/2022. The wound has been in treatment 1 weeks. The wound is located on the Right,Lateral Lower Leg. The wound measures 4.4cm length x 2.8cm width x 0.1cm depth; 9.676cm^2 area and 0.968cm^3 volume. There is no tunneling or undermining noted. There is a medium amount of serosanguineous drainage noted. There is no granulation within the wound bed. There is a large (67-100%) amount of necrotic tissue within the wound bed including Eschar and Adherent Slough. Assessment Active Problems ICD-10 Chronic venous hypertension (idiopathic) with ulcer and inflammation of right lower extremity Lymphedema, not elsewhere classified Non-pressure chronic ulcer of other part of right lower leg with fat layer exposed Chronic kidney disease, stage 3 unspecified Chronic combined systolic (congestive) and diastolic (congestive) heart failure Personal history of traumatic brain injury Procedures Wound #1 Pre-procedure diagnosis of Wound #1 is a Venous Leg Ulcer located on the Right,Lateral Lower Leg . There was a Three Layer Compression Therapy Procedure with a pre-treatment ABI of 0.9 by Betha Loa. Post  procedure Diagnosis Wound #1: Same as Pre-Procedure Plan Follow-up Appointments: Return Appointment in 1 week. Bathing/ Shower/ Hygiene: May shower with wound dressing protected with water repellent cover or cast protector. No tub bath. Edema Control - Lymphedema / Segmental Compressive Device / Other: Optional: One layer of unna paste to top of compression wrap (to act as an anchor). 3 Layer Compression System for Lymphedema. - right lower leg Elevate, Exercise Daily and Avoid Standing for Long Periods of Time. Elevate legs to the level of the heart and pump ankles as often as possible Elevate leg(s) parallel to the floor when sitting. WOUND #1: - Lower Leg Wound Laterality: Right, Lateral Prim Dressing: Silvercel Small 2x2 (in/in) 1 x Per Week/30 Days ary Discharge Instructions: Apply Silvercel Small 2x2 (in/in) as instructed Secondary Dressing: ABD Pad 5x9 (in/in) 1 x Per Week/30 Days Discharge Instructions: Cover with ABD pad Com pression Wrap: 3-LAYER WRAP - Profore Lite LF 3 Multilayer Compression Bandaging System 1 x Per Week/30 Days Discharge Instructions: Apply 3 multi-layer wrap as prescribed. 1.  I am going to recommend that we have the patient continue to monitor for any signs of infection or worsening obviously based on what I am seeing I think we are headed in the right direction. 2. Recommend continue with the silver alginate dressing followed by ABD pad and 3 layer compression wrap which I do believe has done extremely well for her over the past week. We will see patient back for reevaluation in 1 week here in the clinic. If anything worsens or changes patient will contact our office for additional recommendations. [name], [name] ([id number]) [id number] Page 6 of 6 Electronic Signature(s) Signed: 05/26/2022 11:54:48 AM By: Lenda KelpStone III, Mai Longnecker PA-C Entered By: Lenda KelpStone III, Bhavya Grand on 05/26/2022  11:54:48 -------------------------------------------------------------------------------- SuperBill Details Patient Name: Date of Service: Robin LLIFIELD RO GO [name], [name]. 05/26/2022 Medical Record Number: [id number] Patient Account Number: [id number] Date of Birth/Sex: Treating RN: 01/30/1946 (76 y.o. Freddy FinnerF) Epps, Carrie Primary Care Provider: Einar CrowAnderson, Marshall Other Clinician: Betha LoaVenable, Angie Referring Provider: Treating Provider/Extender: Johny ShearsStone, Kloie Whiting Anderson, Marshall Weeks in Treatment: 1 Diagnosis Coding ICD-10 Codes Code Description [phone] Chronic venous hypertension (idiopathic) with ulcer and inflammation of right lower extremity I89.0 Lymphedema, not elsewhere classified L97.812 Non-pressure chronic ulcer of other part of right lower leg with fat layer exposed N18.30 Chronic kidney disease, stage 3 unspecified I50.42 Chronic combined systolic (congestive) and diastolic (congestive) heart failure Z87.820 Personal history of traumatic brain injury Facility Procedures : CPT4 Code: [id number] Description: (Facility Use Only) [id number] - APPLY MULTLAY COMPRS LWR RT LEG ICD-10 Diagnosis Description I87.331 Chronic venous hypertension (idiopathic) with ulcer and inflammation of right lower Modifier: extremity Quantity: 1 Physician Procedures : CPT4 Code Description Modifier [id number] - WC PHYS LEVEL 3 - EST PT ICD-10 Diagnosis Description I87.331 Chronic venous hypertension (idiopathic) with ulcer and inflammation of right lower extremity I89.0 Lymphedema, not elsewhere classified  L97.812 Non-pressure chronic ulcer of other part of right lower leg with fat layer exposed N18.30 Chronic kidney disease, stage 3 unspecified Quantity: 1 Electronic Signature(s) Signed: 05/26/2022 11:55:03 AM By: Lenda KelpStone III, Dixie Jafri PA-C Entered By: Lenda KelpStone III, Breion Novacek on 05/26/2022 11:55:03

## 2022-06-02 ENCOUNTER — Encounter: Payer: Medicare Other | Admitting: Physician Assistant

## 2022-06-02 DIAGNOSIS — I87331 Chronic venous hypertension (idiopathic) with ulcer and inflammation of right lower extremity: Secondary | ICD-10-CM | POA: Diagnosis not present

## 2022-06-02 NOTE — Progress Notes (Signed)
[name], [name] ([id number]) [id number] Page 1 of 9 Visit Report for 05/26/2022 Arrival Information Details Patient Name: Date of Service: [name] [name], [name]. 05/26/2022 11:15 A M Medical Record Number: [id number] Patient Account Number: [id number] Date of Birth/Sex: Treating RN: 04/02/1946 (76 y.o. Robin FinnerF) Epps, Carrie Primary Care Jeremi Losito: Einar CrowAnderson, Marshall Other Clinician: Betha LoaVenable, Angie Referring Tabias Swayze: Treating Lori Popowski/Extender: Johny ShearsStone, Hoyt Anderson, Marshall Weeks in Treatment: 1 Visit Information History Since Last Visit All ordered tests and consults were completed: No Patient Arrived: Robin MorCane Added or deleted any medications: No Arrival Time: 11:29 Any new allergies or adverse reactions: No Transfer Assistance: None Had a fall or experienced change in No Patient Identification Verified: Yes activities of daily living that may affect Secondary Verification Process Completed: Yes risk of falls: Patient Requires Transmission-Based Precautions: No Signs or symptoms of abuse/neglect since last visito No Patient Has Alerts: No Hospitalized since last visit: No Implantable device outside of the clinic excluding No cellular tissue based products placed in the center since last visit: Has Dressing in Place as Prescribed: Yes Has Compression in Place as Prescribed: Yes Pain Present Now: No Electronic Signature(s) Signed: 06/02/2022 4:45:28 PM By: Betha LoaVenable, Angie Entered By: Betha LoaVenable, Angie on 05/26/2022 11:30:24 -------------------------------------------------------------------------------- Clinic Level of Care Assessment Details Patient Name: Date of Service: [name] [name], [name]. 05/26/2022 11:15 A M Medical Record Number: [id number] Patient Account Number: [id number] Date of Birth/Sex: Treating RN: 02/02/1946 (76 y.o. Robin FinnerF) Epps, Carrie Primary Care Zilphia Kozinski: Einar CrowAnderson, Marshall Other Clinician: Betha LoaVenable,  Angie Referring Tymir Terral: Treating Shiniqua Groseclose/Extender: Johny ShearsStone, Hoyt Anderson, Marshall Weeks in Treatment: 1 Clinic Level of Care Assessment Items TOOL 1 Quantity Score []  - 0 Use when EandM and Procedure is performed on INITIAL visit ASSESSMENTS - Nursing Assessment / Reassessment []  - 0 General Physical Exam (combine w/ comprehensive assessment (listed just below) when performed on new pt. evals) [name], [name] ([id number]) [id number] Page 2 of 9 []  - 0 Comprehensive Assessment (HX, ROS, Risk Assessments, Wounds Hx, etc.) ASSESSMENTS - Wound and Skin Assessment / Reassessment []  - 0 Dermatologic / Skin Assessment (not related to wound area) ASSESSMENTS - Ostomy and/or Continence Assessment and Care []  - 0 Incontinence Assessment and Management []  - 0 Ostomy Care Assessment and Management (repouching, etc.) PROCESS - Coordination of Care []  - 0 Simple Patient / Family Education for ongoing care []  - 0 Complex (extensive) Patient / Family Education for ongoing care []  - 0 Staff obtains ChiropractorConsents, Records, T Results / Process Orders est []  - 0 Staff telephones HHA, Nursing Homes / Clarify orders / etc []  - 0 Routine Transfer to another Facility (non-emergent condition) []  - 0 Routine Hospital Admission (non-emergent condition) []  - 0 New Admissions / Manufacturing engineernsurance Authorizations / Ordering NPWT Apligraf, etc. , []  - 0 Emergency Hospital Admission (emergent condition) PROCESS - Special Needs []  - 0 Pediatric / Minor Patient Management []  - 0 Isolation Patient Management []  - 0 Hearing / Language / Visual special needs []  - 0 Assessment of Community assistance (transportation, D/C planning, etc.) []  - 0 Additional assistance / Altered mentation []  - 0 Support Surface(s) Assessment (bed, cushion, seat, etc.) INTERVENTIONS - Miscellaneous []  - 0 External ear exam []  - 0 Patient Transfer (multiple staff / Nurse, adultHoyer Lift / Similar  devices) []  - 0 Simple Staple / Suture removal (25 or less) []  - 0 Complex Staple / Suture removal (26 or more) []  - 0 Hypo/Hyperglycemic Management (do not check if billed separately) []  -  0 Ankle / Brachial Index (ABI) - do not check if billed separately Has the patient been seen at the hospital within the last three years: Yes Total Score: 0 Level Of Care: ____ Electronic Signature(s) Signed: 06/02/2022 4:45:28 PM By: Betha Loa Entered By: Betha Loa on 05/26/2022 11:51:11 -------------------------------------------------------------------------------- Compression Therapy Details Patient Name: Date of Service: [name] [name], [name] 05/26/2022 11:15 A M Medical Record Number: [id number] Patient Account Number: [id number] Date of Birth/Sex: Treating RN: 1945-08-23 (77 y.o. [name] Primary Care Dameion Briles: Einar Crow Other Clinician: Kimberlee, Shoun ([id number]) [id number] Page 3 of 9 Referring Zeva Leber: Treating Sanika Brosious/Extender: Johny Shears in Treatment: 1 Compression Therapy Performed for Wound Assessment: Wound #1 Right,Lateral Lower Leg Performed By: Farrel Gordon, Angie, Compression Type: Three Layer Pre Treatment ABI: 0.9 Post Procedure Diagnosis Same as Pre-procedure Electronic Signature(s) Signed: 06/02/2022 4:45:28 PM By: Betha Loa Entered By: Betha Loa on 05/26/2022 11:50:28 -------------------------------------------------------------------------------- Encounter Discharge Information Details Patient Name: Date of Service: [name] [name]. 05/26/2022 11:15 A M Medical Record Number: [id number] Patient Account Number: [id number] Date of Birth/Sex: Treating RN: [birth year] (76 y.o. [name] Primary Care Lovetta Condie: Einar Crow Other Clinician: Betha Loa Referring Jquan Egelston: Treating Rahsaan Weakland/Extender:  Johny Shears in Treatment: 1 Encounter Discharge Information Items Discharge Condition: Stable Ambulatory Status: Cane Discharge Destination: Home Transportation: Private Auto Accompanied By: self Schedule Follow-up Appointment: Yes Clinical Summary of Care: Electronic Signature(s) Signed: 06/02/2022 4:45:28 PM By: Betha Loa Entered By: Betha Loa on 05/26/2022 13:26:46 -------------------------------------------------------------------------------- Lower Extremity Assessment Details Patient Name: Date of Service: [name] [name], [name] 05/26/2022 11:15 A M Medical Record Number: [id number] Patient Account Number: [id number] Date of Birth/Sex: Treating RN: [birth year] (76 y.o. [name] Primary Care Yahayra Geis: Einar Crow Other Clinician: Betha Loa Referring Liylah Najarro: Treating Monaca Wadas/Extender: Johny Shears in Treatment: [address] Carlena Hurl ([id number]) [id number] Page 4 of 9 Edema Assessment Assessed: [Left: No] [Right: Yes] Edema: [Left: Ye] [Right: s] Calf Left: Right: Point of Measurement: 32 cm From Medial Instep 33.5 cm 31 cm Ankle Left: Right: Point of Measurement: 10 cm From Medial Instep 21.3 cm 19.5 cm Knee To Floor Left: Right: From Medial Instep 40 cm 39.5 cm Vascular Assessment Pulses: Dorsalis Pedis Palpable: [Left:Yes] [Right:Yes] Electronic Signature(s) Signed: 05/26/2022 4:34:43 PM By: Yevonne Pax RN Signed: 06/02/2022 4:45:28 PM By: Betha Loa Entered By: Betha Loa on 05/26/2022 11:58:01 -------------------------------------------------------------------------------- Multi Wound Chart Details Patient Name: Date of Service: [name] [name]. 05/26/2022 11:15 A M Medical Record Number: [id number] Patient Account Number: [id number] Date of Birth/Sex: Treating RN: 04/23/1946 (76 y.o. [name] Primary Care Javione Gunawan: Einar Crow Other Clinician: Betha Loa Referring Wateen Varon: Treating Lorece Keach/Extender: Johny Shears in Treatment: 1 Vital Signs Height(in): 66 Pulse(bpm): 94 Weight(lbs): 237 Blood Pressure(mmHg): 169/89 Body Mass Index(BMI): 38.2 Temperature(F): 98.2 Respiratory Rate(breaths/min): 18 [1:Photos:] [N/A:N/A] Right, Lateral Lower Leg N/A N/A Wound Location: Blister N/A N/A Wounding Event: Venous Leg Ulcer N/A N/A Primary Etiology: UNNAMED, HINO ([id number]) [id number] Page 5 of 9 Congestive Heart Failure, N/A N/A Comorbid History: Hypertension 05/13/2022 N/A N/A Date Acquired: 1 N/A N/A Weeks of Treatment: Open N/A N/A Wound Status: No N/A N/A Wound Recurrence: Yes N/A N/A Clustered Wound: 4.4x2.8x0.1 N/A N/A Measurements L x W x D (cm) 9.676 N/A N/A A (cm) : rea 0.968 N/A N/A Volume (cm) : 48.70%  N/A N/A % Reduction in A rea: 48.60% N/A N/A % Reduction in Volume: Partial Thickness N/A N/A Classification: Medium N/A N/A Exudate A mount: Serosanguineous N/A N/A Exudate Type: red, brown N/A N/A Exudate Color: None Present (0%) N/A N/A Granulation A mount: Large (67-100%) N/A N/A Necrotic A mount: Eschar, Adherent Slough N/A N/A Necrotic Tissue: Fascia: No N/A N/A Exposed Structures: Fat Layer (Subcutaneous Tissue): No Tendon: No Muscle: No Joint: No Bone: No Medium (34-66%) N/A N/A Epithelialization: Compression Therapy N/A N/A Procedures Performed: Treatment Notes Electronic Signature(s) Signed: 06/02/2022 4:45:28 PM By: Betha Loa Entered By: Betha Loa on 05/26/2022 12:00:08 -------------------------------------------------------------------------------- Multi-Disciplinary Care Plan Details Patient Name: Date of Service: [name] [name]. 05/26/2022 11:15 A M Medical Record Number: [id number] Patient  Account Number: [id number] Date of Birth/Sex: Treating RN: 20-Sep-1945 (76 y.o. [name] Primary Care Dewana Ammirati: Einar Crow Other Clinician: Betha Loa Referring Diahann Guajardo: Treating Lilygrace Rodick/Extender: Johny Shears in Treatment: 1 Active Inactive Necrotic Tissue Nursing Diagnoses: Knowledge deficit related to management of necrotic/devitalized tissue Goals: Necrotic/devitalized tissue will be minimized in the wound bed Date Initiated: 05/19/2022 Target Resolution Date: 06/19/2022 Goal Status: Active Interventions: Assess patient pain level pre-, during and post procedure and prior to discharge Provide education on necrotic tissue and debridement process Treatment Activities: Biologic debridement : 05/19/2022 Excisional debridement : 05/19/2022 [name], [name] ([id number]) [id number] Page 6 of 9 Notes: Wound/Skin Impairment Nursing Diagnoses: Knowledge deficit related to ulceration/compromised skin integrity Goals: Patient/caregiver will verbalize understanding of skin care regimen Date Initiated: 05/19/2022 Target Resolution Date: 06/19/2022 Goal Status: Active Ulcer/skin breakdown will have a volume reduction of 30% by week 4 Date Initiated: 05/19/2022 Target Resolution Date: 06/19/2022 Goal Status: Active Ulcer/skin breakdown will have a volume reduction of 50% by week 8 Date Initiated: 05/19/2022 Target Resolution Date: 07/20/2022 Goal Status: Active Ulcer/skin breakdown will have a volume reduction of 80% by week 12 Date Initiated: 05/19/2022 Target Resolution Date: 08/18/2022 Goal Status: Active Ulcer/skin breakdown will heal within 14 weeks Date Initiated: 05/19/2022 Target Resolution Date: 09/18/2022 Goal Status: Active Interventions: Assess patient/caregiver ability to obtain necessary supplies Assess patient/caregiver ability to perform ulcer/skin care regimen upon admission and as needed Assess  ulceration(s) every visit Notes: Electronic Signature(s) Signed: 05/26/2022 4:34:43 PM By: Yevonne Pax RN Signed: 06/02/2022 4:45:28 PM By: Betha Loa Entered By: Betha Loa on 05/26/2022 12:00:26 -------------------------------------------------------------------------------- Pain Assessment Details Patient Name: Date of Service: [name] [name]. 05/26/2022 11:15 A M Medical Record Number: [id number] Patient Account Number: [id number] Date of Birth/Sex: Treating RN: [birth year] (76 y.o. [name] Primary Care Reshard Guillet: Einar Crow Other Clinician: Betha Loa Referring Judi Jaffe: Treating Boykin Baetz/Extender: Johny Shears in Treatment: 1 Active Problems Location of Pain Severity and Description of Pain Patient Has Paino No Site Locations [name], [name] ([id number]) [id number] Page 7 of 9 Pain Management and Medication Current Pain Management: Electronic Signature(s) Signed: 05/26/2022 4:34:43 PM By: Yevonne Pax RN Signed: 06/02/2022 4:45:28 PM By: Betha Loa Entered By: Betha Loa on 05/26/2022 11:33:41 -------------------------------------------------------------------------------- Patient/Caregiver Education Details Patient Name: Date of Service: [name] [name] 12/12/2023andnbsp11:15 A M Medical Record Number: [id number] Patient Account Number: [id number] Date of Birth/Gender: Treating RN: 1945-11-01 (76 y.o. [name] Primary Care Physician: Einar Crow Other Clinician: Betha Loa Referring Physician: Treating Physician/Extender: Johny Shears in Treatment: 1 Education Assessment Education Provided To: Patient Education Topics Provided Wound/Skin Impairment: Methods: Explain/Verbal Responses: State  content correctly Electronic Signature(s) Signed: 06/02/2022 4:45:28 PM By: Betha Loa Entered  By: Betha Loa on 05/26/2022 12:00:21 [name] ([id number]) [id number] Page 8 of 9 -------------------------------------------------------------------------------- Wound Assessment Details Patient Name: Date of Service: [name] [name], [name] 05/26/2022 11:15 A M Medical Record Number: [id number] Patient Account Number: [id number] Date of Birth/Sex: Treating RN: [birth year] (76 y.o. [name] Primary Care Kaili Castille: Einar Crow Other Clinician: Betha Loa Referring Jaecion Dempster: Treating Lavender Stanke/Extender: Johny Shears in Treatment: 1 Wound Status Wound Number: 1 Primary Etiology: Venous Leg Ulcer Wound Location: Right, Lateral Lower Leg Wound Status: Open Wounding Event: Blister Comorbid History: Congestive Heart Failure, Hypertension Date Acquired: 05/13/2022 Weeks Of Treatment: 1 Clustered Wound: Yes Photos Wound Measurements Length: (cm) 4.4 Width: (cm) 2.8 Depth: (cm) 0.1 Area: (cm) 9.676 Volume: (cm) 0.968 % Reduction in Area: 48.7% % Reduction in Volume: 48.6% Epithelialization: Medium (34-66%) Tunneling: No Undermining: No Wound Description Classification: Partial Thickness Exudate Amount: Medium Exudate Type: Serosanguineous Exudate Color: red, brown Foul Odor After Cleansing: No Slough/Fibrino Yes Wound Bed Granulation Amount: None Present (0%) Exposed Structure Necrotic Amount: Large (67-100%) Fascia Exposed: No Necrotic Quality: Eschar, Adherent Slough Fat Layer (Subcutaneous Tissue) Exposed: No Tendon Exposed: No Muscle Exposed: No Joint Exposed: No Bone Exposed: No Electronic Signature(s) Signed: 05/26/2022 4:34:43 PM By: Yevonne Pax RN Signed: 06/02/2022 4:45:28 PM By: Betha Loa Entered By: Betha Loa on 05/26/2022 11:43:32 [name] ([id number]) [id number] Page 9 of  9 -------------------------------------------------------------------------------- Vitals Details Patient Name: Date of Service: [name] [name], [name] 05/26/2022 11:15 A M Medical Record Number: [id number] Patient Account Number: [id number] Date of Birth/Sex: Treating RN: 08-16-1945 (75 y.o. [name] Primary Care Breland Trouten: Einar Crow Other Clinician: Betha Loa Referring Effrey Davidow: Treating Vallarie Fei/Extender: Johny Shears in Treatment: 1 Vital Signs Time Taken: 11:30 Temperature (F): 98.2 Height (in): 66 Pulse (bpm): 94 Weight (lbs): 237 Respiratory Rate (breaths/min): 18 Body Mass Index (BMI): 38.2 Blood Pressure (mmHg): 169/89 Reference Range: 80 - 120 mg / dl Electronic Signature(s) Signed: 06/02/2022 4:45:28 PM By: Betha Loa Entered By: Betha Loa on 05/26/2022 11:33:36

## 2022-06-02 NOTE — Progress Notes (Addendum)
[name], Beverley [name] ([id number]) [id number] Page 1 of 6 Visit Report for 06/02/2022 Chief Complaint Document Details Patient Name: Date of Service: [name] [name], Minervia [name]. 06/02/2022 9:00 A M Medical Record Number: [id number] Patient Account Number: [id number] Date of Birth/Sex: Treating RN: 05/02/1946 (76 y.o. Skip MayerF) Woody, Kim Primary Care Provider: Einar CrowAnderson, Marshall Other Clinician: Betha LoaVenable, Angie Referring Provider: Treating Provider/Extender: Johny ShearsStone, Rayann Jolley Anderson, Marshall Weeks in Treatment: 2 Information Obtained from: Patient Chief Complaint Right LE Ulcer Electronic Signature(s) Signed: 06/02/2022 9:18:57 AM By: Lenda KelpStone III, Ninoshka Wainwright PA-C Entered By: Lenda KelpStone III, Adyline Huberty on 06/02/2022 09:18:57 -------------------------------------------------------------------------------- HPI Details Patient Name: Date of Service: [name] [name], Siriyah [name]. 06/02/2022 9:00 A M Medical Record Number: [id number] Patient Account Number: [id number] Date of Birth/Sex: Treating RN: 06/22/1945 (76 y.o. Skip MayerF) Woody, Kim Primary Care Provider: Einar CrowAnderson, Marshall Other Clinician: Betha LoaVenable, Angie Referring Provider: Treating Provider/Extender: Johny ShearsStone, Treniya Lobb Anderson, Marshall Weeks in Treatment: 2 History of Present Illness HPI Description: 05-19-2022 upon evaluation today patient appears to be doing poorly in regard to wounds of the right lateral lower extremity. The good news is these do not appear to be too significant the bad news is they are somewhat painful. This on 05-13-2022 started more as blisters and have continued to worsen. Fortunately I do not see any signs of severe infection at this point which is good news. Nonetheless I do believe there is some evidence of infection and cellulitis here. Patient does have a history of chronic venous insufficiency, lymphedema, chronic kidney disease stage III, congestive heart failure, and personal history  of traumatic brain injury. 05-26-2022 upon evaluation today patient actually appears to be doing much better in regard to her leg ulcer. She has been tolerating the dressing changes without complication and overall I am actually extremely pleased with where we stand today. Fortunately there does not appear to be any signs of infection locally nor systemically at this point I do believe the antibiotics have been beneficial. 06-02-2022 upon evaluation today patient appears to be doing much better in regard to her wound. She is actually showing signs of excellent improvement the compression wrapping seems to be doing quite well for her. Fortunately I do not see any signs of infection locally or systemically at this time which is great news. No fevers, chills, nausea, vomiting, or diarrhea. [name], [name] ([id number]) [id number] Page 2 of 6 Electronic Signature(s) Signed: 06/02/2022 9:33:26 AM By: Lenda KelpStone III, Rudolfo Brandow PA-C Entered By: Lenda KelpStone III, Merdis Snodgrass on 06/02/2022 09:33:26 -------------------------------------------------------------------------------- Physical Exam Details Patient Name: Date of Service: [name] [name], [name]. 06/02/2022 9:00 A M Medical Record Number: [id number] Patient Account Number: [id number] Date of Birth/Sex: Treating RN: 12/19/1945 (76 y.o. Skip MayerF) Woody, Kim Primary Care Provider: Einar CrowAnderson, Marshall Other Clinician: Betha LoaVenable, Angie Referring Provider: Treating Provider/Extender: Johny ShearsStone, Kalon Erhardt Anderson, Marshall Weeks in Treatment: 2 Constitutional Well-nourished and well-hydrated in no acute distress. Respiratory normal breathing without difficulty. Psychiatric this patient is able to make decisions and demonstrates good insight into disease process. Alert and Oriented x 3. pleasant and cooperative. Notes Upon inspection patient's wound bed actually showed signs of good granulation epithelization at this point. Fortunately  I do not see any evidence of infection locally nor systemically which is great news and overall I am extremely pleased with where we stand currently. Electronic Signature(s) Signed: 06/02/2022 9:33:46 AM By: Lenda KelpStone III, Dam Ashraf PA-C Entered By: Lenda KelpStone III, Ameli Sangiovanni on 06/02/2022 09:33:46 -------------------------------------------------------------------------------- Physician Orders Details Patient Name: Date of Service: HO LLIFIELD RO  GO MANDISA, PERSINGER 06/02/2022 9:00 A M Medical Record Number: [id number] Patient Account Number: [id number] Date of Birth/Sex: Treating RN: 09-14-1945 (76 y.o. [name] Primary Care Provider: Einar Crow Other Clinician: Betha Loa Referring Provider: Treating Provider/Extender: Johny Shears in Treatment: 2 Verbal / Phone Orders: No Diagnosis Coding ICD-10 Coding Code Description I87.331 Chronic venous hypertension (idiopathic) with ulcer and inflammation of right lower extremity I89.0 Lymphedema, not elsewhere classified DALAYA, SUPPA ([id number]) [id number] Page 3 of 6 [phone] Non-pressure chronic ulcer of other part of right lower leg with fat layer exposed N18.30 Chronic kidney disease, stage 3 unspecified I50.42 Chronic combined systolic (congestive) and diastolic (congestive) heart failure Z87.820 Personal history of traumatic brain injury Follow-up Appointments Return Appointment in 1 week. Bathing/ Shower/ Hygiene May shower with wound dressing protected with water repellent cover or cast protector. No tub bath. Edema Control - Lymphedema / Segmental Compressive Device / Other Optional: One layer of unna paste to top of compression wrap (to act as an anchor). 3 Layer Compression System for Lymphedema. - right lower leg Elevate, Exercise Daily and A void Standing for Long Periods of Time. Elevate legs to the level of the heart and pump ankles as often as  possible Elevate leg(s) parallel to the floor when sitting. Wound Treatment Wound #1 - Lower Leg Wound Laterality: Right, Lateral Prim Dressing: Silvercel Small 2x2 (in/in) 1 x Per Week/30 Days ary Discharge Instructions: Apply Silvercel Small 2x2 (in/in) as instructed Secondary Dressing: ABD Pad 5x9 (in/in) 1 x Per Week/30 Days Discharge Instructions: Cover with ABD pad Compression Wrap: 3-LAYER WRAP - Profore Lite LF 3 Multilayer Compression Bandaging System 1 x Per Week/30 Days Discharge Instructions: Apply 3 multi-layer wrap as prescribed. Compression Stockings: Circaid Juxta Lite Compression Wrap Right Leg Compression Amount: 20-30 mmHG Discharge Instructions: Apply Circaid Juxta Lite Compression Wrap as directed Electronic Signature(s) Signed: 06/02/2022 4:11:06 PM By: Lenda Kelp PA-C Signed: 06/02/2022 4:44:06 PM By: Betha Loa Entered By: Betha Loa on 06/02/2022 09:30:14 -------------------------------------------------------------------------------- Problem List Details Patient Name: Date of Service: [name] 06/02/2022 9:00 A M Medical Record Number: [id number] Patient Account Number: [id number] Date of Birth/Sex: Treating RN: 07-04-1945 (76 y.o. [name] Primary Care Provider: Einar Crow Other Clinician: Betha Loa Referring Provider: Treating Provider/Extender: Johny Shears in Treatment: 2 Active Problems ICD-10 Encounter Code Description Active Date MDM Diagnosis I87.331 Chronic venous hypertension (idiopathic) with ulcer and inflammation of right 05/19/2022 No Yes lower extremity I89.0 Lymphedema, not elsewhere classified 05/19/2022 No Yes BASSHEVA, FLURY ([id number]) [id number] Page 4 of 6 [phone] Non-pressure chronic ulcer of other part of right lower leg with fat layer 05/19/2022 No Yes exposed N18.30 Chronic kidney disease, stage 3  unspecified 05/19/2022 No Yes I50.42 Chronic combined systolic (congestive) and diastolic (congestive) heart failure 05/19/2022 No Yes Z87.820 Personal history of traumatic brain injury 05/19/2022 No Yes Inactive Problems Resolved Problems Electronic Signature(s) Signed: 06/02/2022 4:11:06 PM By: Lenda Kelp PA-C Signed: 06/02/2022 4:44:06 PM By: Betha Loa Previous Signature: 06/02/2022 9:18:53 AM Version By: Lenda Kelp PA-C Entered By: Betha Loa on 06/02/2022 09:42:55 -------------------------------------------------------------------------------- Progress Note Details Patient Name: Date of Service: [name] Ivin Poot [name]. 06/02/2022 9:00 A M Medical Record Number: [id number] Patient Account Number: [id number] Date of Birth/Sex: Treating RN: 01-20-1946 (76 y.o. Cathlean Cower, Kim Primary Care Provider: Einar Crow Other Clinician: Betha Loa Referring Provider: Treating Provider/Extender: Johny Shears  in Treatment: 2 Subjective Chief Complaint Information obtained from Patient Right LE Ulcer History of Present Illness (HPI) 05-19-2022 upon evaluation today patient appears to be doing poorly in regard to wounds of the right lateral lower extremity. The good news is these do not appear to be too significant the bad news is they are somewhat painful. This on 05-13-2022 started more as blisters and have continued to worsen. Fortunately I do not see any signs of severe infection at this point which is good news. Nonetheless I do believe there is some evidence of infection and cellulitis here. Patient does have a history of chronic venous insufficiency, lymphedema, chronic kidney disease stage III, congestive heart failure, and personal history of traumatic brain injury. 05-26-2022 upon evaluation today patient actually appears to be doing much better in regard to her leg ulcer. She has been tolerating the dressing changes without  complication and overall I am actually extremely pleased with where we stand today. Fortunately there does not appear to be any signs of infection locally nor systemically at this point I do believe the antibiotics have been beneficial. 06-02-2022 upon evaluation today patient appears to be doing much better in regard to her wound. She is actually showing signs of excellent improvement the compression wrapping seems to be doing quite well for her. Fortunately I do not see any signs of infection locally or systemically at this time which is great news. No fevers, chills, nausea, vomiting, or diarrhea. CONCEPTION, DOEBLER ([id number]) [id number] Page 5 of 6 Objective Constitutional Well-nourished and well-hydrated in no acute distress. Vitals Time Taken: 9:12 AM, Height: 66 in, Weight: 237 lbs, BMI: 38.2, Temperature: 98.3 F, Pulse: 84 bpm, Respiratory Rate: 18 breaths/min, Blood Pressure: 163/97 mmHg. Respiratory normal breathing without difficulty. Psychiatric this patient is able to make decisions and demonstrates good insight into disease process. Alert and Oriented x 3. pleasant and cooperative. General Notes: Upon inspection patient's wound bed actually showed signs of good granulation epithelization at this point. Fortunately I do not see any evidence of infection locally nor systemically which is great news and overall I am extremely pleased with where we stand currently. Integumentary (Hair, Skin) Wound #1 status is Open. Original cause of wound was Blister. The date acquired was: 05/13/2022. The wound has been in treatment 2 weeks. The wound is located on the Right,Lateral Lower Leg. The wound measures 0.5cm length x 0.2cm width x 0.1cm depth; 0.079cm^2 area and 0.008cm^3 volume. There is no tunneling or undermining noted. There is a medium amount of serosanguineous drainage noted. There is no granulation within the wound bed. There is a  large (67-100%) amount of necrotic tissue within the wound bed including Eschar and Adherent Slough. Assessment Active Problems ICD-10 Chronic venous hypertension (idiopathic) with ulcer and inflammation of right lower extremity Lymphedema, not elsewhere classified Non-pressure chronic ulcer of other part of right lower leg with fat layer exposed Chronic kidney disease, stage 3 unspecified Chronic combined systolic (congestive) and diastolic (congestive) heart failure Personal history of traumatic brain injury Procedures Wound #1 Pre-procedure diagnosis of Wound #1 is a Venous Leg Ulcer located on the Right,Lateral Lower Leg . There was a Compression Therapy Procedure with a pre- treatment ABI of 0.9 by Betha Loa. Post procedure Diagnosis Wound #1: Same as Pre-Procedure Plan Follow-up Appointments: Return Appointment in 1 week. Bathing/ Shower/ Hygiene: May shower with wound dressing protected with water repellent cover or cast protector. No tub bath. Edema Control - Lymphedema / Segmental Compressive Device / Other:  Optional: One layer of unna paste to top of compression wrap (to act as an anchor). 3 Layer Compression System for Lymphedema. - right lower leg Elevate, Exercise Daily and Avoid Standing for Long Periods of Time. Elevate legs to the level of the heart and pump ankles as often as possible Elevate leg(s) parallel to the floor when sitting. WOUND #1: - Lower Leg Wound Laterality: Right, Lateral Prim Dressing: Silvercel Small 2x2 (in/in) 1 x Per Week/30 Days ary Discharge Instructions: Apply Silvercel Small 2x2 (in/in) as instructed Secondary Dressing: ABD Pad 5x9 (in/in) 1 x Per Week/30 Days Discharge Instructions: Cover with ABD pad Com pression Wrap: 3-LAYER WRAP - Profore Lite LF 3 Multilayer Compression Bandaging System 1 x Per Week/30 Days Discharge Instructions: Apply 3 multi-layer wrap as prescribed. Com pression Stockings: Circaid Juxta Lite Compression  Wrap Compression Amount: 20-30 mmHg (right) Discharge Instructions: Apply Circaid Juxta Lite Compression Wrap as directed MEIKO, IVES ([id number]) [id number] Page 6 of 6 1. I am going to recommend based on what I am seeing that we have the patient go ahead and continue to monitor for any signs of infection or worsening. Obviously I do believe that we are headed in the right direction I am very pleased in that regard. 2. I am also can recommend that we have the patient continue with the 3 layer compression wrap which I think is doing an awesome job. 3. I am also going to suggest that she should continue with ABD pad to cover after applying the silver alginate dressing. 4. We do have a juxta lite compression wrap ready for her as soon as she heals will be ready to initiate treatment with that. We will see patient back for reevaluation in 1 week here in the clinic. If anything worsens or changes patient will contact our office for additional recommendations. Electronic Signature(s) Signed: 06/02/2022 9:34:31 AM By: Lenda Kelp PA-C Entered By: Lenda Kelp on 06/02/2022 09:34:30 -------------------------------------------------------------------------------- SuperBill Details Patient Name: Date of Service: [name] [name] 06/02/2022 Medical Record Number: [id number] Patient Account Number: [id number] Date of Birth/Sex: Treating RN: Oct 03, 1945 (76 y.o. Cathlean Cower, Kim Primary Care Provider: Einar Crow Other Clinician: Betha Loa Referring Provider: Treating Provider/Extender: Johny Shears in Treatment: 2 Diagnosis Coding ICD-10 Codes Code Description [phone] Chronic venous hypertension (idiopathic) with ulcer and inflammation of right lower extremity I89.0 Lymphedema, not elsewhere classified L97.812 Non-pressure chronic ulcer of other part of right lower leg with fat layer  exposed N18.30 Chronic kidney disease, stage 3 unspecified I50.42 Chronic combined systolic (congestive) and diastolic (congestive) heart failure Z87.820 Personal history of traumatic brain injury Facility Procedures : CPT4 Code: [id number] Description: (Facility Use Only) [phone] - APPLY MULTLAY COMPRS LWR RT LEG ICD-10 Diagnosis Description L97.812 Non-pressure chronic ulcer of other part of right lower leg with fat layer exposed Modifier: Quantity: 1 Physician Procedures : CPT4 Code Description Modifier [id number] - WC PHYS LEVEL 3 - EST PT ICD-10 Diagnosis Description I87.331 Chronic venous hypertension (idiopathic) with ulcer and inflammation of right lower extremity I89.0 Lymphedema, not elsewhere classified  L97.812 Non-pressure chronic ulcer of other part of right lower leg with fat layer exposed N18.30 Chronic kidney disease, stage 3 unspecified Quantity: 1 Electronic Signature(s) Signed: 06/02/2022 9:35:37 AM By: Lenda Kelp PA-C Entered By: Lenda Kelp on 06/02/2022 09:35:37

## 2022-06-02 NOTE — Progress Notes (Signed)
ATHEA, [name] ([id number]) [id number] Page 1 of 9 Visit Report for 06/02/2022 Arrival Information Details Patient Name: Date of Service: [name], [name] 06/02/2022 9:00 A M Medical Record Number: [id number] Patient Account Number: [id number] Date of Birth/Sex: Treating RN: 1945-11-19 (76 y.o. Charolette Forward, [name] Primary Care Dusty Raczkowski: Frazier Richards Other Clinician: Massie Kluver Referring Paddy Neis: Treating Nusrat Encarnacion/Extender: Benay Pillow in Treatment: 2 Visit Information History Since Last Visit All ordered tests and consults were completed: No Patient Arrived: [name] Added or deleted any medications: No Arrival Time: 09:10 Any new allergies or adverse reactions: No Transfer Assistance: None Had a fall or experienced change in No Patient Identification Verified: Yes activities of daily living that may affect Secondary Verification Process Completed: Yes risk of falls: Patient Requires Transmission-Based Precautions: No Signs or symptoms of abuse/neglect since last visito No Patient Has Alerts: No Hospitalized since last visit: No Implantable device outside of the clinic excluding No cellular tissue based products placed in the center since last visit: Has Dressing in Place as Prescribed: Yes Has Compression in Place as Prescribed: Yes Pain Present Now: No Electronic Signature(s) Signed: 06/02/2022 4:44:06 PM By: Massie Kluver Entered By: Massie Kluver on 06/02/2022 09:11:51 -------------------------------------------------------------------------------- Clinic Level of Care Assessment Details Patient Name: Date of Service: Robin LLIFIELD [name], [name] 06/02/2022 9:00 A M Medical Record Number: [id number] Patient Account Number: [id number] Date of Birth/Sex: Treating RN: 08-13-1945 (76 y.o. [name] Primary Care Yohann Curl: Frazier Richards Other Clinician: Massie Kluver Referring Gwenn Teodoro: Treating Marquesa Rath/Extender: Benay Pillow in Treatment: 2 Clinic Level of Care Assessment Items TOOL 1 Quantity Score []  - 0 Use when EandM and Procedure is performed on INITIAL visit ASSESSMENTS - Nursing Assessment / Reassessment []  - 0 General Physical Exam (combine w/ comprehensive assessment (listed just below) when performed on new pt. evals) [name], [name] ([id number]) [id number] Page 2 of 9 []  - 0 Comprehensive Assessment (HX, ROS, Risk Assessments, Wounds Hx, etc.) ASSESSMENTS - Wound and Skin Assessment / Reassessment []  - 0 Dermatologic / Skin Assessment (not related to wound area) ASSESSMENTS - Ostomy and/or Continence Assessment and Care []  - 0 Incontinence Assessment and Management []  - 0 Ostomy Care Assessment and Management (repouching, etc.) PROCESS - Coordination of Care []  - 0 Simple Patient / Family Education for ongoing care []  - 0 Complex (extensive) Patient / Family Education for ongoing care []  - 0 Staff obtains Programmer, systems, Records, T Results / Process Orders est []  - 0 Staff telephones HHA, Nursing Homes / Clarify orders / etc []  - 0 Routine Transfer to another Facility (non-emergent condition) []  - 0 Routine Hospital Admission (non-emergent condition) []  - 0 New Admissions / Biomedical engineer / Ordering NPWT Apligraf, etc. , []  - 0 Emergency Hospital Admission (emergent condition) PROCESS - Special Needs []  - 0 Pediatric / Minor Patient Management []  - 0 Isolation Patient Management []  - 0 Hearing / Language / Visual special needs []  - 0 Assessment of Community assistance (transportation, D/C planning, etc.) []  - 0 Additional assistance / Altered mentation []  - 0 Support Surface(s) Assessment (bed, cushion, seat, etc.) INTERVENTIONS - Miscellaneous []  - 0 External ear exam []  - 0 Patient Transfer (multiple staff / Civil Service fast streamer / Similar  devices) []  - 0 Simple Staple / Suture removal (25 or less) []  - 0 Complex Staple / Suture removal (26 or more) []  - 0 Hypo/Hyperglycemic Management (do not check if billed separately) []  -  0 Ankle / Brachial Index (ABI) - do not check if billed separately Has the patient been seen at the hospital within the last three years: Yes Total Score: 0 Level Of Care: ____ Electronic Signature(s) Signed: 06/02/2022 4:44:06 PM By: Betha Loa Entered By: Betha Loa on 06/02/2022 09:30:20 -------------------------------------------------------------------------------- Compression Therapy Details Patient Name: Date of Service: Robin LLIFIELD Robin GO Robin Hutchinson. 06/02/2022 9:00 A M Medical Record Number: [id number] Patient Account Number: [id number] Date of Birth/Sex: Treating RN: September 02, 1945 (76 y.o. [name] Primary Care Ciarra Braddy: Einar Crow Other Clinician: Marijane, Trower ([id number]) [id number] Page 3 of 9 Referring Marcelino Campos: Treating Maxie Slovacek/Extender: Johny Shears in Treatment: 2 Compression Therapy Performed for Wound Assessment: Wound #1 Right,Lateral Lower Leg Performed By: Farrel Gordon, Karoline Caldwell, Pre Treatment ABI: 0.9 Post Procedure Diagnosis Same as Pre-procedure Electronic Signature(s) Signed: 06/02/2022 4:44:06 PM By: Betha Loa Entered By: Betha Loa on 06/02/2022 09:29:45 -------------------------------------------------------------------------------- Encounter Discharge Information Details Patient Name: Date of Service: Robin LLIFIELD Robin GO Robin Hutchinson. 06/02/2022 9:00 A M Medical Record Number: [id number] Patient Account Number: [id number] Date of Birth/Sex: Treating RN: January 06, 1946 (76 y.o. [name] Primary Care Tarence Searcy: Einar Crow Other Clinician: Betha Loa Referring Chenelle Benning: Treating Andelyn Spade/Extender: Johny Shears  in Treatment: 2 Encounter Discharge Information Items Discharge Condition: Stable Ambulatory Status: Cane Discharge Destination: Home Transportation: Private Auto Accompanied By: self Schedule Follow-up Appointment: Yes Clinical Summary of Care: Electronic Signature(s) Signed: 06/02/2022 4:44:06 PM By: Betha Loa Entered By: Betha Loa on 06/02/2022 09:43:31 -------------------------------------------------------------------------------- Lower Extremity Assessment Details Patient Name: Date of Service: Robin LLIFIELD Robin GO [place], RINCON 06/02/2022 9:00 A M Medical Record Number: [id number] Patient Account Number: [id number] Date of Birth/Sex: Treating RN: 09-04-1945 (76 y.o. [name] Primary Care Collyn Selk: Einar Crow Other Clinician: Betha Loa Referring Glennette Galster: Treating Hiroki Wint/Extender: Alvino Chapel Weeks in Treatment: 2 Edema Assessment Left: [Left: Right] [Right: :] Assessed: [Left: No] [Right: Yes] Edema: [Left: N] [Right: o] Calf Left: Right: Point of Measurement: 32 cm From Medial Instep 31.4 cm Ankle Left: Right: Point of Measurement: 10 cm From Medial Instep 19 cm Vascular Assessment Pulses: Dorsalis Pedis Palpable: [Right:Yes] Electronic Signature(s) Signed: 06/02/2022 4:44:06 PM By: Betha Loa Signed: 06/02/2022 5:11:45 PM By: Elliot Gurney, BSN, RN, CWS, Kim RN, BSN Entered By: Betha Loa on 06/02/2022 09:25:41 -------------------------------------------------------------------------------- Multi Wound Chart Details Patient Name: Date of Service: Robin LLIFIELD Robin GO Robin Hutchinson. 06/02/2022 9:00 A M Medical Record Number: [id number] Patient Account Number: [id number] Date of Birth/Sex: Treating RN: 12-Mar-1946 (76 y.o. [name] Primary Care Charisse Wendell: Einar Crow Other Clinician: Betha Loa Referring Hy Swiatek: Treating Enis Leatherwood/Extender: Johny Shears in Treatment: 2 Vital  Signs Height(in): 66 Pulse(bpm): 84 Weight(lbs): 237 Blood Pressure(mmHg): 163/97 Body Mass Index(BMI): 38.2 Temperature(F): 98.3 Respiratory Rate(breaths/min): 18 [1:Photos:] [N/A:N/A] Right, Lateral Lower Leg N/A N/A Wound Location: Blister N/A N/A Wounding Event: Venous Leg Ulcer N/A N/A Primary Etiology: Congestive Heart Failure, N/A N/A Comorbid History: Hypertension 05/13/2022 N/A N/A Date Acquired: 2 N/A N/A Weeks of Treatment: Open N/A N/A Wound Status: No N/A N/A Wound Recurrence: Yes N/A N/A Clustered Wound: BONNA, STEURY ([id number]) [id number] Page 5 of 9 0.5x0.2x0.1 N/A N/A Measurements Robin x W x D (cm) 0.079 N/A N/A A (cm) : rea 0.008 N/A N/A Volume (cm) : 99.60% N/A N/A % Reduction in A rea: 99.60% N/A N/A % Reduction in Volume: Partial Thickness N/A N/A Classification: Medium N/A N/A  Exudate A mount: Serosanguineous N/A N/A Exudate Type: red, brown N/A N/A Exudate Color: None Present (0%) N/A N/A Granulation A mount: Large (67-100%) N/A N/A Necrotic A mount: Eschar, Adherent Slough N/A N/A Necrotic Tissue: Fascia: No N/A N/A Exposed Structures: Fat Layer (Subcutaneous Tissue): No Tendon: No Muscle: No Joint: No Bone: No Medium (34-66%) N/A N/A Epithelialization: Treatment Notes Electronic Signature(s) Signed: 06/02/2022 4:44:06 PM By: Massie Kluver Entered By: Massie Kluver on 06/02/2022 09:25:44 -------------------------------------------------------------------------------- [place] Details Patient Name: Date of Service: Robin LLIFIELD Robin GO [name]. 06/02/2022 9:00 A M Medical Record Number: [id number] Patient Account Number: [id number] Date of Birth/Sex: Treating RN: June 13, 1946 (76 y.o. Charolette Forward, [name] Primary Care Kyro Joswick: Frazier Richards Other Clinician: Massie Kluver Referring Dewight Catino: Treating Verniece Encarnacion/Extender: Benay Pillow in Treatment: 2 Active Inactive Necrotic Tissue Nursing Diagnoses: Knowledge deficit related to management of necrotic/devitalized tissue Goals: Necrotic/devitalized tissue will be minimized in the wound bed Date Initiated: 05/19/2022 Target Resolution Date: 06/19/2022 Goal Status: Active Interventions: Assess patient pain level pre-, during and post procedure and prior to discharge Provide education on necrotic tissue and debridement process Treatment Activities: Biologic debridement : 05/19/2022 Excisional debridement : 05/19/2022 Notes: Wound/Skin Impairment Nursing Diagnoses: Knowledge deficit related to ulceration/compromised skin integrity [name], [name] ([id number]) [id number] Page 6 of 9 Goals: Patient/caregiver will verbalize understanding of skin care regimen Date Initiated: 05/19/2022 Target Resolution Date: 06/19/2022 Goal Status: Active Ulcer/skin breakdown will have a volume reduction of 30% by week 4 Date Initiated: 05/19/2022 Target Resolution Date: 06/19/2022 Goal Status: Active Ulcer/skin breakdown will have a volume reduction of 50% by week 8 Date Initiated: 05/19/2022 Target Resolution Date: 07/20/2022 Goal Status: Active Ulcer/skin breakdown will have a volume reduction of 80% by week 12 Date Initiated: 05/19/2022 Target Resolution Date: 08/18/2022 Goal Status: Active Ulcer/skin breakdown will heal within 14 weeks Date Initiated: 05/19/2022 Target Resolution Date: 09/18/2022 Goal Status: Active Interventions: Assess patient/caregiver ability to obtain necessary supplies Assess patient/caregiver ability to perform ulcer/skin care regimen upon admission and as needed Assess ulceration(s) every visit Notes: Electronic Signature(s) Signed: 06/02/2022 4:44:06 PM By: Massie Kluver Signed: 06/02/2022 5:11:45 PM By: Gretta Cool, BSN, RN, CWS, Kim RN, BSN Entered By: Massie Kluver on 06/02/2022  09:31:02 -------------------------------------------------------------------------------- Pain Assessment Details Patient Name: Date of Service: Robin LLIFIELD Robin GO [name]. 06/02/2022 9:00 A M Medical Record Number: [id number] Patient Account Number: [id number] Date of Birth/Sex: Treating RN: [birth year] (76 y.o. Charolette Forward, [name] Primary Care Telisha Zawadzki: Frazier Richards Other Clinician: Massie Kluver Referring Kessie Croston: Treating Jerusalem Brownstein/Extender: Benay Pillow in Treatment: 2 Active Problems Location of Pain Severity and Description of Pain Patient Has Paino No Site Locations [name], [name] ([id number]) [phone].pdf Page 7 of 9 Pain Management and Medication Current Pain Management: Electronic Signature(s) Signed: 06/02/2022 4:44:06 PM By: Massie Kluver Signed: 06/02/2022 5:11:45 PM By: Gretta Cool, BSN, RN, CWS, Kim RN, BSN Entered By: Massie Kluver on 06/02/2022 09:15:53 -------------------------------------------------------------------------------- Patient/Caregiver Education Details Patient Name: Date of Service: Robin LLIFIELD Robin GO [name] 12/19/2023andnbsp9:00 A M Medical Record Number: [id number] Patient Account Number: [id number] Date of Birth/Gender: Treating RN: Oct 26, 1945 (76 y.o. [name] Primary Care Physician: Frazier Richards Other Clinician: Massie Kluver Referring Physician: Treating Physician/Extender: Benay Pillow in Treatment: 2 Education Assessment Education Provided To: Patient Education Topics Provided Wound/Skin Impairment: Handouts: Other: continue wound care as directed Methods: Explain/Verbal Responses: State content correctly Electronic Signature(s) Signed: 06/02/2022 4:44:06 PM By: Massie Kluver Entered By:  Massie Kluver on 06/02/2022 09:30:59 -------------------------------------------------------------------------------- Wound  Assessment Details Patient Name: Date of Service: Robin LLIFIELD Robin GO [name], [name] 06/02/2022 9:00 A M Medical Record Number: [id number] Patient Account Number: [id number] Date of Birth/Sex: Treating RN: [birth year] (76 y.o. Charolette Forward, [name] Primary Care Starr Engel: Frazier Richards Other Clinician: Massie Kluver Referring Jane Birkel: Treating Johnaton Sonneborn/Extender: Elvina Sidle Weeks in Treatment: 2 Wound Status [name], [name] ([id number]) [id number] Page 8 of 9 Wound Number: 1 Primary Etiology: Venous Leg Ulcer Wound Location: Right, Lateral Lower Leg Wound Status: Open Wounding Event: Blister Comorbid History: Congestive Heart Failure, Hypertension Date Acquired: 05/13/2022 Weeks Of Treatment: 2 Clustered Wound: Yes Photos Wound Measurements Length: (cm) 0.5 Width: (cm) 0.2 Depth: (cm) 0.1 Area: (cm) 0.079 Volume: (cm) 0.008 % Reduction in Area: 99.6% % Reduction in Volume: 99.6% Epithelialization: Medium (34-66%) Tunneling: No Undermining: No Wound Description Classification: Partial Thickness Exudate Amount: Medium Exudate Type: Serosanguineous Exudate Color: red, brown Foul Odor After Cleansing: No Slough/Fibrino Yes Wound Bed Granulation Amount: None Present (0%) Exposed Structure Necrotic Amount: Large (67-100%) Fascia Exposed: No Necrotic Quality: Eschar, Adherent Slough Fat Layer (Subcutaneous Tissue) Exposed: No Tendon Exposed: No Muscle Exposed: No Joint Exposed: No Bone Exposed: No Treatment Notes Wound #1 (Lower Leg) Wound Laterality: Right, Lateral Cleanser Peri-Wound Care Topical Primary Dressing Silvercel Small 2x2 (in/in) Discharge Instruction: Apply Silvercel Small 2x2 (in/in) as instructed Secondary Dressing ABD Pad 5x9 (in/in) Discharge Instruction: Cover with ABD pad Secured With Compression Wrap 3-LAYER WRAP - Profore Lite LF 3 Multilayer Compression Bandaging System Discharge  Instruction: Apply 3 multi-layer wrap as prescribed. Compression Stockings Circaid Juxta Lite Compression Wrap Quantity: 1 Right Leg Compression Amount: 20-30 mmHg Discharge Instruction: Apply Circaid Juxta Lite Compression Wrap as directed Add-Ons [name], [name] ([id number]) [id number] Page 9 of 9 Electronic Signature(s) Signed: 06/02/2022 4:44:06 PM By: Massie Kluver Signed: 06/02/2022 5:11:45 PM By: Gretta Cool, BSN, RN, CWS, Kim RN, BSN Entered By: Massie Kluver on 06/02/2022 09:24:18 -------------------------------------------------------------------------------- Vitals Details Patient Name: Date of Service: Robin LLIFIELD Robin GO [name]. 06/02/2022 9:00 A M Medical Record Number: [id number] Patient Account Number: [id number] Date of Birth/Sex: Treating RN: [birth year] (76 y.o. Charolette Forward, [name] Primary Care Marikay Roads: Frazier Richards Other Clinician: Massie Kluver Referring Avanell Banwart: Treating Josselyn Harkins/Extender: Benay Pillow in Treatment: 2 Vital Signs Time Taken: 09:12 Temperature (F): 98.3 Height (in): 66 Pulse (bpm): 84 Weight (lbs): 237 Respiratory Rate (breaths/min): 18 Body Mass Index (BMI): 38.2 Blood Pressure (mmHg): 163/97 Reference Range: 80 - 120 mg / dl Electronic Signature(s) Signed: 06/02/2022 4:44:06 PM By: Massie Kluver Entered By: Massie Kluver on 06/02/2022 09:15:44

## 2022-06-09 ENCOUNTER — Encounter: Payer: Medicare Other | Admitting: Internal Medicine

## 2022-06-09 DIAGNOSIS — I87331 Chronic venous hypertension (idiopathic) with ulcer and inflammation of right lower extremity: Secondary | ICD-10-CM | POA: Diagnosis not present

## 2022-06-10 NOTE — Progress Notes (Signed)
[name], [name] ([id number]) [id number] Page 1 of 6 Visit Report for 06/09/2022 HPI Details Patient Name: Date of Service: [name], [name]. 06/09/2022 9:15 A M Medical Record Number: [id number] Patient Account Number: [id number] Date of Birth/Sex: Treating RN: 05/19/1946 (76 y.o. Skip MayerF) Woody, Kim Primary Care Provider: Einar CrowAnderson, Marshall Other Clinician: Referring Provider: Treating Provider/Extender: Chauncey MannO BSO N, MICHA EL Candis ShineG Anderson, Marshall Weeks in Treatment: 3 History of Present Illness HPI Description: 05-19-2022 upon evaluation today patient appears to be doing poorly in regard to wounds of the right lateral lower extremity. The good news is these do not appear to be too significant the bad news is they are somewhat painful. This on 05-13-2022 started more as blisters and have continued to worsen. Fortunately I do not see any signs of severe infection at this point which is good news. Nonetheless I do believe there is some evidence of infection and cellulitis here. Patient does have a history of chronic venous insufficiency, lymphedema, chronic kidney disease stage III, congestive heart failure, and personal history of traumatic brain injury. 05-26-2022 upon evaluation today patient actually appears to be doing much better in regard to her leg ulcer. She has been tolerating the dressing changes without complication and overall I am actually extremely pleased with where we stand today. Fortunately there does not appear to be any signs of infection locally nor systemically at this point I do believe the antibiotics have been beneficial. 06-02-2022 upon evaluation today patient appears to be doing much better in regard to her wound. She is actually showing signs of excellent improvement the compression wrapping seems to be doing quite well for her. Fortunately I do not see any signs of infection locally or systemically at this time which  is great news. No fevers, chills, nausea, vomiting, or diarrhea. 12/26; the patient only has a very small open area left on the right lateral lower leg. Her edema control is excellent. We have ordered her juxta lites in an attempt to control the swelling in her legs. She tells us that only 1 stocking was sent for the right leg. She cannot get standard over the toe stockings on Electronic Signature(s) Signed: 06/09/2022 4:30:46 PM By: Baltazar Najjarobson, Evyn Putzier MD Previous Signature: 06/09/2022 10:03:30 AM Version By: Elliot GurneyWoody, BSN, RN, CWS, Kim RN, BSN Entered By: Baltazar Najjarobson, Koren Sermersheim on 06/09/2022 10:20:21 -------------------------------------------------------------------------------- Physical Exam Details Patient Name: Date of Service: [name], Raelyn [name]. 06/09/2022 9:15 A M Medical Record Number: [id number] Patient Account Number: [id number] Date of Birth/Sex: Treating RN: 06/16/1945 (76 y.o. Skip MayerF) Woody, Kim Primary Care Provider: Einar CrowAnderson, Marshall Other Clinician: Referring Provider: Treating Provider/Extender: RO BSO N, MICHA EL Candis ShineG Anderson, Marshall Weeks in Treatment: 3 Constitutional Patient is hypertensive.. Pulse regular and within target range for patient.Marland Kitchen. Respirations regular, non-labored and within target range.. Temperature is normal and within the target range for the patient.Marland Kitchen. appears in no distress. [name], [name] ([id number]) [id number] Page 2 of 6 Cardiovascular Pedal pulses plpable. Notes Wound exam; small open area on the right lateral lower leg. Some eschar around the circumference which was easily removed. This looks as though it will close by the time she is in next week. No evidence of surrounding infection. Noteworthy that she has skin changes of chronic lymphedema in the left leg as well [but no open wound] Electronic Signature(s) Signed: 06/09/2022 4:30:46 PM By: Baltazar Najjarobson, Levaeh Vice MD Previous Signature: 06/09/2022 10:02:05  AM Version By: Elliot GurneyWoody, BSN, RN, CWS, Kim RN, BSN Entered  By: Baltazar Najjar on 06/09/2022 10:23:14 -------------------------------------------------------------------------------- Physician Orders Details Patient Name: Date of Service: Robin LLIFIELD RO GO [name], [name] 06/09/2022 9:15 A M Medical Record Number: [id number] Patient Account Number: [id number] Date of Birth/Sex: Treating RN: 1946/02/25 (76 y.o. [name] Primary Care Provider: Einar Crow Other Clinician: Referring Provider: Treating Provider/Extender: Chauncey Mann, MICHA EL Candis Shine in Treatment: 3 Verbal / Phone Orders: No Diagnosis Coding ICD-10 Coding Code Description I87.331 Chronic venous hypertension (idiopathic) with ulcer and inflammation of right lower extremity I89.0 Lymphedema, not elsewhere classified L97.812 Non-pressure chronic ulcer of other part of right lower leg with fat layer exposed N18.30 Chronic kidney disease, stage 3 unspecified I50.42 Chronic combined systolic (congestive) and diastolic (congestive) heart failure Z87.820 Personal history of traumatic brain injury Follow-up Appointments Return Appointment in 1 week. Bathing/ Shower/ Hygiene May shower with wound dressing protected with water repellent cover or cast protector. No tub bath. Edema Control - Lymphedema / Segmental Compressive Device / Other Optional: One layer of unna paste to top of compression wrap (to act as an anchor). 3 Layer Compression System for Lymphedema. - right lower leg Elevate, Exercise Daily and A void Standing for Long Periods of Time. Elevate legs to the level of the heart and pump ankles as often as possible Elevate leg(s) parallel to the floor when sitting. Wound Treatment Wound #1 - Lower Leg Wound Laterality: Right, Lateral Prim Dressing: Silvercel Small 2x2 (in/in) 1 x Per Week/30 Days ary Discharge Instructions: Apply Silvercel Small 2x2 (in/in) as instructed Secondary Dressing:  ABD Pad 5x9 (in/in) 1 x Per Week/30 Days Discharge Instructions: Cover with ABD pad Compression Wrap: 3-LAYER WRAP - Profore Lite LF 3 Multilayer Compression Bandaging System 1 x Per Week/30 Days Discharge Instructions: Apply 3 multi-layer wrap as prescribed. Compression Stockings: Circaid Juxta Lite Compression [address] JULIANI, LADUKE ([id number]) [id number] Page 3 of 6 Right Leg Compression Amount: 20-30 mmHG Discharge Instructions: Apply Circaid Juxta Lite Compression Wrap as directed Electronic Signature(s) Signed: 06/09/2022 4:30:46 PM By: Baltazar Najjar MD Signed: 06/09/2022 5:21:00 PM By: Midge Aver MSN RN CNS WTA Entered By: Midge Aver on 06/09/2022 10:31:24 -------------------------------------------------------------------------------- Problem List Details Patient Name: Date of Service: Robin [name] 06/09/2022 9:15 A M Medical Record Number: [id number] Patient Account Number: [id number] Date of Birth/Sex: Treating RN: [birth year] (76 y.o. [name] Primary Care Provider: Einar Crow Other Clinician: Referring Provider: Treating Provider/Extender: Chauncey Mann, MICHA EL Candis Shine in Treatment: 3 Active Problems ICD-10 Encounter Code Description Active Date MDM Diagnosis I87.331 Chronic venous hypertension (idiopathic) with ulcer and inflammation of right 05/19/2022 No Yes lower extremity I89.0 Lymphedema, not elsewhere classified 05/19/2022 No Yes L97.812 Non-pressure chronic ulcer of other part of right lower leg with fat layer 05/19/2022 No Yes exposed N18.30 Chronic kidney disease, stage 3 unspecified 05/19/2022 No Yes I50.42 Chronic combined systolic (congestive) and diastolic (congestive) heart failure 05/19/2022 No Yes Z87.820 Personal history of traumatic brain injury 05/19/2022 No Yes Inactive Problems Resolved Problems Electronic Signature(s) Signed: 06/09/2022 4:30:46 PM By:  Baltazar Najjar MD Entered By: Baltazar Najjar on 06/09/2022 10:17:31 [name] ([id number]) [id number] Page 4 of 6 -------------------------------------------------------------------------------- Progress Note Details Patient Name: Date of Service: Robin LLIFIELD RO GO [name], [name] 06/09/2022 9:15 A M Medical Record Number: [id number] Patient Account Number: [id number] Date of Birth/Sex: Treating RN: [birth year] (76 y.o. [name] Primary Care Provider: Einar Crow Other Clinician: Referring Provider: Treating Provider/Extender: Loa Socks  BSO N, MICHA EL Jeralene Peters Weeks in Treatment: 3 Subjective History of Present Illness (HPI) 05-19-2022 upon evaluation today patient appears to be doing poorly in regard to wounds of the right lateral lower extremity. The good news is these do not appear to be too significant the bad news is they are somewhat painful. This on 05-13-2022 started more as blisters and have continued to worsen. Fortunately I do not see any signs of severe infection at this point which is good news. Nonetheless I do believe there is some evidence of infection and cellulitis here. Patient does have a history of chronic venous insufficiency, lymphedema, chronic kidney disease stage III, congestive heart failure, and personal history of traumatic brain injury. 05-26-2022 upon evaluation today patient actually appears to be doing much better in regard to her leg ulcer. She has been tolerating the dressing changes without complication and overall I am actually extremely pleased with where we stand today. Fortunately there does not appear to be any signs of infection locally nor systemically at this point I do believe the antibiotics have been beneficial. 06-02-2022 upon evaluation today patient appears to be doing much better in regard to her wound. She is actually showing signs of excellent improvement the compression  wrapping seems to be doing quite well for her. Fortunately I do not see any signs of infection locally or systemically at this time which is great news. No fevers, chills, nausea, vomiting, or diarrhea. 12/26; the patient only has a very small open area left on the right lateral lower leg. Her edema control is excellent. We have ordered her juxta lites in an attempt to control the swelling in her legs. She tells [name] that only 1 stocking was sent for the right leg. She cannot get standard over the toe stockings on Objective Constitutional Patient is hypertensive.. Pulse regular and within target range for patient.Marland Kitchen Respirations regular, non-labored and within target range.. Temperature is normal and within the target range for the patient.Marland Kitchen appears in no distress. Vitals Time Taken: 10:00 AM, Height: 66 in, Weight: 237 lbs, BMI: 38.2, Temperature: 98.6 F, Pulse: 85 bpm, Respiratory Rate: 18 breaths/min, Blood Pressure: 177/92 mmHg. Cardiovascular Pedal pulses plpable. General Notes: Wound exam; small open area on the right lateral lower leg. Some eschar around the circumference which was easily removed. This looks as though it will close by the time she is in next week. No evidence of surrounding infection. Noteworthy that she has skin changes of chronic lymphedema in the left leg as well [but no open wound] Integumentary (Hair, Skin) Wound #1 status is Open. Original cause of wound was Blister. The date acquired was: 05/13/2022. The wound has been in treatment 3 weeks. The wound is located on the Right,Lateral Lower Leg. The wound measures 0.3cm length x 0.5cm width x 0.1cm depth; 0.118cm^2 area and 0.012cm^3 volume. There is a medium amount of serosanguineous drainage noted. There is no granulation within the wound bed. There is a large (67-100%) amount of necrotic tissue within the wound bed including Eschar and Adherent Slough. Assessment Active Problems ICD-10 Chronic venous hypertension  (idiopathic) with ulcer and inflammation of right lower extremity Lymphedema, not elsewhere classified [name], LISANTI ([id number]) [id number] Page 5 of 6 Non-pressure chronic ulcer of other part of right lower leg with fat layer exposed Chronic kidney disease, stage 3 unspecified Chronic combined systolic (congestive) and diastolic (congestive) heart failure Personal history of traumatic brain injury Plan 1. The area on the right lower lateral lower  leg is just about closed. 2. I did not change the primary dressing this which which is silver alginate with 3 layer compression. 3. The patient has apparently a single JuxtaLite stocking for the right leg however looking at her left leg makes me concerned about leaving this without a stocking. Electronic Signature(s) Signed: 06/09/2022 4:30:46 PM By: Baltazar Najjar MD Entered By: Baltazar Najjar on 06/09/2022 10:24:19 -------------------------------------------------------------------------------- SuperBill Details Patient Name: Date of Service: Robin LLIFIELD RO GO [name] 06/09/2022 Medical Record Number: [id number] Patient Account Number: [id number] Date of Birth/Sex: Treating RN: [birth year] (76 y.o. [name] Primary Care Provider: Einar Crow Other Clinician: Referring Provider: Treating Provider/Extender: Chauncey Mann, MICHA EL Candis Shine in Treatment: 3 Diagnosis Coding ICD-10 Codes Code Description [phone] Chronic venous hypertension (idiopathic) with ulcer and inflammation of right lower extremity I89.0 Lymphedema, not elsewhere classified L97.812 Non-pressure chronic ulcer of other part of right lower leg with fat layer exposed N18.30 Chronic kidney disease, stage 3 unspecified I50.42 Chronic combined systolic (congestive) and diastolic (congestive) heart failure Z87.820 Personal history of traumatic brain injury Facility Procedures : CPT4 Code:  [id number] Description: (Facility Use Only) [phone] - APPLY MULTLAY COMPRS LWR RT LEG Modifier: Quantity: 1 Physician Procedures : CPT4 Code Description Modifier [id number] - WC PHYS LEVEL 3 - EST PT ICD-10 Diagnosis Description I87.331 Chronic venous hypertension (idiopathic) with ulcer and inflammation of right lower extremity I89.0 Lymphedema, not elsewhere classified  L97.812 Non-pressure chronic ulcer of other part of right lower leg with fat layer exposed Quantity: 1 Electronic Signature(s) ANIEYA, HELMAN ([id number]) [id number] Page 6 of 6 Signed: 06/09/2022 4:30:46 PM By: Baltazar Najjar MD Signed: 06/09/2022 5:21:00 PM By: Midge Aver MSN RN CNS WTA Entered By: Midge Aver on 06/09/2022 10:32:49

## 2022-06-10 NOTE — Progress Notes (Signed)
[name], [name] ([id number]) [id number] Page 1 of 9 Visit Report for 06/09/2022 Arrival Information Details Patient Name: Date of Service: [name] [name]. 06/09/2022 9:15 A M Medical Record Number: [id number] Patient Account Number: [id number] Date of Birth/Sex: Treating RN: 08/10/1945 (76 y.o. Ginette PitmanF) Smith, Vicki Primary Care Caydin Yeatts: Einar CrowAnderson, Marshall Other Clinician: Referring Jvon Meroney: Treating Adewale Pucillo/Extender: Chauncey MannO BSO N, MICHA EL Candis ShineG Anderson, Marshall Weeks in Treatment: 3 Visit Information History Since Last Visit Added or deleted any medications: No Patient Arrived: Gilmer MorCane Any new allergies or adverse reactions: No Arrival Time: 09:52 Had a fall or experienced change in No Accompanied By: self activities of daily living that may affect Transfer Assistance: None risk of falls: Patient Identification Verified: Yes Hospitalized since last visit: No Secondary Verification Process Completed: Yes Pain Present Now: No Patient Requires Transmission-Based Precautions: No Patient Has Alerts: No Electronic Signature(s) Signed: 06/09/2022 5:21:00 PM By: Midge AverSmith, Vicki MSN RN CNS WTA Entered By: Midge AverSmith, Vicki on 06/09/2022 10:00:44 -------------------------------------------------------------------------------- Clinic Level of Care Assessment Details Patient Name: Date of Service: [name] [name]. 06/09/2022 9:15 A M Medical Record Number: [id number] Patient Account Number: [id number] Date of Birth/Sex: Treating RN: 02/12/1946 ([age] y.o. Ginette PitmanF) Smith, Vicki Primary Care Shareen Capwell: Einar CrowAnderson, Marshall Other Clinician: Referring Bailey Faiella: Treating Amillion Scobee/Extender: Chauncey MannO BSO N, MICHA EL Candis ShineG Anderson, Marshall Weeks in Treatment: 3 Clinic Level of Care Assessment Items TOOL 4 Quantity Score X- 1 0 Use when only an EandM is performed on FOLLOW-UP visit ASSESSMENTS - Nursing Assessment / Reassessment X- 1 10 Reassessment of  Co-morbidities (includes updates in patient status) X- 1 5 Reassessment of Adherence to Treatment Plan ASSESSMENTS - Wound and Skin A ssessment / Reassessment X - Simple Wound Assessment / Reassessment - one wound 1 5 []  - 0 Complex Wound Assessment / Reassessment - multiple wounds [name], [name] ([id number]) [id number] Page 2 of 9 []  - 0 Dermatologic / Skin Assessment (not related to wound area) ASSESSMENTS - Focused Assessment []  - 0 Circumferential Edema Measurements - multi extremities []  - 0 Nutritional Assessment / Counseling / Intervention []  - 0 Lower Extremity Assessment (monofilament, tuning fork, pulses) []  - 0 Peripheral Arterial Disease Assessment (using hand held doppler) ASSESSMENTS - Ostomy and/or Continence Assessment and Care []  - 0 Incontinence Assessment and Management []  - 0 Ostomy Care Assessment and Management (repouching, etc.) PROCESS - Coordination of Care X - Simple Patient / Family Education for ongoing care 1 15 []  - 0 Complex (extensive) Patient / Family Education for ongoing care X- 1 10 Staff obtains ChiropractorConsents, Records, T Results / Process Orders est []  - 0 Staff telephones HHA, Nursing Homes / Clarify orders / etc []  - 0 Routine Transfer to another Facility (non-emergent condition) []  - 0 Routine Hospital Admission (non-emergent condition) []  - 0 New Admissions / Manufacturing engineernsurance Authorizations / Ordering NPWT Apligraf, etc. , []  - 0 Emergency Hospital Admission (emergent condition) X- 1 10 Simple Discharge Coordination []  - 0 Complex (extensive) Discharge Coordination PROCESS - Special Needs []  - 0 Pediatric / Minor Patient Management []  - 0 Isolation Patient Management []  - 0 Hearing / Language / Visual special needs []  - 0 Assessment of Community assistance (transportation, D/C planning, etc.) []  - 0 Additional assistance / Altered mentation []  - 0 Support Surface(s) Assessment (bed,  cushion, seat, etc.) INTERVENTIONS - Wound Cleansing / Measurement X - Simple Wound Cleansing - one wound 1 5 []  - 0 Complex Wound Cleansing - multiple wounds X-  1 5 Wound Imaging (photographs - any number of wounds) []  - 0 Wound Tracing (instead of photographs) X- 1 5 Simple Wound Measurement - one wound []  - 0 Complex Wound Measurement - multiple wounds INTERVENTIONS - Wound Dressings X - Small Wound Dressing one or multiple wounds 1 10 []  - 0 Medium Wound Dressing one or multiple wounds []  - 0 Large Wound Dressing one or multiple wounds []  - 0 Application of Medications - topical []  - 0 Application of Medications - injection INTERVENTIONS - Miscellaneous []  - 0 External ear exam []  - 0 Specimen Collection (cultures, biopsies, blood, body fluids, etc.) []  - 0 Specimen(s) / Culture(s) sent or taken to Lab for analysis [name], [name] ( ) [id number] Page 3 of 9 []  - 0 Patient Transfer (multiple staff / / Similar devices) []  - 0 Simple Staple / Suture removal (25 or less) []  - 0 Complex Staple / Suture removal (26 or more) []  - 0 Hypo / Hyperglycemic Management (close monitor of Blood Glucose) []  - 0 Ankle / Brachial Index (ABI) - do not check if billed separately X- 1 5 Vital Signs Has the patient been seen at the hospital within the last three years: Yes Total Score: 85 Level Of Care: New/Established - Level 3 Electronic Signature(s) Signed: 06/09/2022 5:21:00 PM By: MSN RN CNS WTA Entered By: on 06/09/2022 10:31:57 -------------------------------------------------------------------------------- Compression Therapy Details Patient Name: Date of Service: [name] . 06/09/2022 9:15 A M Medical Record Number: [id number] Patient Account Number: 04-16-1994 Date of Birth/Sex: Treating RN: [birth year] (76 y.o. Primary Care Morad Tal: Other Clinician: Referring [place] Hair: Treating [place] Crance/Extender: RO BSO , MICHA EL in Treatment: 3 Compression Therapy Performed for Wound Assessment: Wound #1 Right,Lateral Lower Leg Performed By: Clinician 06/11/2022, RN Compression Type: Three Layer Post Procedure Diagnosis Same as Pre-procedure Electronic Signature(s) Signed: 06/09/2022 5:21:00 PM By: Midge Aver MSN RN CNS WTA Entered By: 06/11/2022 on 06/09/2022 10:32:28 -------------------------------------------------------------------------------- Encounter Discharge Information Details Patient Name: Date of Service: [name] 06/11/2022. 06/09/2022 9:15 A M Medical Record Number: [id number] Patient Account Number: 10/18/1945 Date of Birth/Sex: Treating RN: 01/24/1946 (76 y.o. Einar Crow Primary Care Daelen Belvedere: Dorris Carnes Other Clinician: Referring Jaikob Borgwardt: Treating Devri Kreher/Extender: RO BSO Candis Shine, MICHA EL Midge Aver in Treatment: 3 NIL, BOLSER (Midge Aver) [id number] Page 4 of 9 Encounter Discharge Information Items Discharge Condition: Stable Ambulatory Status: Cane Discharge Destination: Home Transportation: Private Auto Accompanied By: self Schedule Follow-up Appointment: Yes Clinical Summary of Care: Electronic Signature(s) Signed: 06/09/2022 5:21:00 PM By: Robin Hutchinson MSN RN CNS WTA Entered By: 06/11/2022 on 06/09/2022 10:33:41 -------------------------------------------------------------------------------- Lower Extremity Assessment Details Patient Name: Date of Service: [name] [name], MASLIN 06/09/2022 9:15 A M Medical Record Number: [id number] Patient Account Number: [name] Date of Birth/Sex: Treating RN: Dec 19, 1945 (76 y.o. Candis Shine Primary Care Dravin Lance: Robin Salisbury Other Clinician: Referring Amarria Andreasen: Treating Dorion Petillo/Extender: RO BSO N, MICHA EL  [id number] Weeks in Treatment: 3 Edema Assessment Assessed: [Left: No] [Right: No] [Left: Edema] [Right: :] Calf Left: Right: Point of Measurement: 32 cm From Medial Instep 32.5 cm Ankle Left: Right: Point of Measurement: 10 cm From Medial Instep 19 cm Vascular Assessment Pulses: Dorsalis Pedis Palpable: [Right:Yes] Electronic Signature(s) Signed: 06/09/2022 5:21:00 PM By: 06/11/2022 MSN RN CNS WTA Entered By: Midge Aver on 06/09/2022 10:11:20 [place]  [name], [name] ([id number]) [id number] Page 5 of 9 -------------------------------------------------------------------------------- Multi-Disciplinary Care Plan Details Patient Name: Date of Service: [name] [name], [name] 06/09/2022 9:15 A M Medical Record Number: [id number] Patient Account Number: [id number] Date of Birth/Sex: Treating RN: 04-26-1946 (76 y.o. [name] Primary Care Jovante Hammitt: Einar Crow Other Clinician: Referring Harmonee Tozer: Treating Kaegan Stigler/Extender: Chauncey Mann, MICHA EL Candis Shine in Treatment: 3 Active Inactive Necrotic Tissue Nursing Diagnoses: Knowledge deficit related to management of necrotic/devitalized tissue Goals: Necrotic/devitalized tissue will be minimized in the wound bed Date Initiated: 05/19/2022 Target Resolution Date: 06/19/2022 Goal Status: Active Interventions: Assess patient pain level pre-, during and post procedure and prior to discharge Provide education on necrotic tissue and debridement process Treatment Activities: Biologic debridement : 05/19/2022 Excisional debridement : 05/19/2022 Notes: Wound/Skin Impairment Nursing Diagnoses: Knowledge deficit related to ulceration/compromised skin integrity Goals: Patient/caregiver will verbalize understanding of skin care regimen Date Initiated: 05/19/2022 Target Resolution Date: 06/19/2022 Goal Status: Active Ulcer/skin breakdown will have a volume  reduction of 30% by week 4 Date Initiated: 05/19/2022 Target Resolution Date: 06/19/2022 Goal Status: Active Ulcer/skin breakdown will have a volume reduction of 50% by week 8 Date Initiated: 05/19/2022 Target Resolution Date: 07/20/2022 Goal Status: Active Ulcer/skin breakdown will have a volume reduction of 80% by week 12 Date Initiated: 05/19/2022 Target Resolution Date: 08/18/2022 Goal Status: Active Ulcer/skin breakdown will heal within 14 weeks Date Initiated: 05/19/2022 Target Resolution Date: 09/18/2022 Goal Status: Active Interventions: Assess patient/caregiver ability to obtain necessary supplies Assess patient/caregiver ability to perform ulcer/skin care regimen upon admission and as needed Assess ulceration(s) every visit Notes: Electronic Signature(s) Signed: 06/09/2022 5:21:00 PM By: Midge Aver MSN RN CNS WTA Entered By: Midge Aver on 06/09/2022 10:33:04 Robin Salisbury ([id number]) [id number] Page 6 of 9 -------------------------------------------------------------------------------- Pain Assessment Details Patient Name: Date of Service: [name] [name], [name] 06/09/2022 9:15 A M Medical Record Number: [id number] Patient Account Number: [id number] Date of Birth/Sex: Treating RN: 1945/09/11 (76 y.o. [name] Primary Care Ashad Fawbush: Einar Crow Other Clinician: Referring Deaysia Grigoryan: Treating Quenten Nawaz/Extender: RO BSO Dorris Carnes, MICHA EL Candis Shine in Treatment: 3 Active Problems Location of Pain Severity and Description of Pain Patient Has Paino No Site Locations Pain Management and Medication Current Pain Management: Electronic Signature(s) Signed: 06/09/2022 5:21:00 PM By: Midge Aver MSN RN CNS WTA Entered By: Midge Aver on 06/09/2022 10:02:35 -------------------------------------------------------------------------------- Patient/Caregiver Education Details Patient Name: Date of  Service: [name] [name] 12/26/2023andnbsp9:15 A M Medical Record Number: [id number] Patient Account Number: [id number] Date of Birth/Gender: Treating RN: 01-29-1946 (76 y.o. [name] Primary Care Physician: Einar Crow Other Clinician: Referring Physician: Treating Physician/Extender: RO BSO N, MICHA EL Candis Shine in Treatment: 3 [name], [name] ([id number]) [id number] Page 7 of 9 Education Assessment Education Provided To: Patient Education Topics Provided Wound/Skin Impairment: Handouts: Caring for Your Ulcer Methods: Explain/Verbal Responses: State content correctly Electronic Signature(s) Signed: 06/09/2022 5:21:00 PM By: Midge Aver MSN RN CNS WTA Entered By: Midge Aver on 06/09/2022 10:33:00 -------------------------------------------------------------------------------- Wound Assessment Details Patient Name: Date of Service: [name] [name]. 06/09/2022 9:15 A M Medical Record Number: [id number] Patient Account Number: [id number] Date of Birth/Sex: Treating RN: 03/18/1946 (76 y.o. [name] Primary Care Iqra Rotundo: Einar Crow Other Clinician: Referring Jesly Hartmann: Treating Andalyn Heckstall/Extender: RO BSO N, MICHA EL Jeralene Peters Weeks in Treatment: 3 Wound Status Wound Number: 1 Primary Etiology: Venous Leg Ulcer  Wound Location: Right, Lateral Lower Leg Wound Status: Open Wounding Event: Blister Comorbid History: Congestive Heart Failure, Hypertension Date Acquired: 05/13/2022 Weeks Of Treatment: 3 Clustered Wound: Yes Photos Wound Measurements Length: (cm) 0.3 Width: (cm) 0.5 Depth: (cm) 0.1 Area: (cm) 0.118 Volume: (cm) 0.012 % Reduction in Area: 99.4% % Reduction in Volume: 99.4% Epithelialization: Medium (34-66%) Wound Description Classification: Partial Thickness Exudate Amount: Medium Exudate Type: Serosanguineous [name], [name] ([id number]) Exudate Color: red, brown Foul Odor After Cleansing: No Slough/Fibrino Yes [id number] Page 8 of 9 Wound Bed Granulation Amount: None Present (0%) Exposed Structure Necrotic Amount: Large (67-100%) Fascia Exposed: No Necrotic Quality: Eschar, Adherent Slough Fat Layer (Subcutaneous Tissue) Exposed: No Tendon Exposed: No Muscle Exposed: No Joint Exposed: No Bone Exposed: No Treatment Notes Wound #1 (Lower Leg) Wound Laterality: Right, Lateral Cleanser Peri-Wound Care Topical Primary Dressing Silvercel Small 2x2 (in/in) Discharge Instruction: Apply Silvercel Small 2x2 (in/in) as instructed Secondary Dressing ABD Pad 5x9 (in/in) Discharge Instruction: Cover with ABD pad Secured With Compression Wrap 3-LAYER WRAP - Profore Lite LF 3 Multilayer Compression Bandaging System Discharge Instruction: Apply 3 multi-layer wrap as prescribed. Compression Stockings Circaid Juxta Lite Compression Wrap Quantity: 1 Right Leg Compression Amount: 20-30 mmHg Discharge Instruction: Apply Circaid Juxta Lite Compression Wrap as directed Add-Ons Electronic Signature(s) Signed: 06/09/2022 5:21:00 PM By: Midge Aver MSN RN CNS WTA Entered By: Midge Aver on 06/09/2022 10:10:16 -------------------------------------------------------------------------------- Vitals Details Patient Name: Date of Service: [name] [name]. 06/09/2022 9:15 A M Medical Record Number: [id number] Patient Account Number: [id number] Date of Birth/Sex: Treating RN: [birth year] (76 y.o. [name] Primary Care Saroya Riccobono: Einar Crow Other Clinician: Referring Charmin Aguiniga: Treating Maher Shon/Extender: RO BSO N, MICHA EL Candis Shine in Treatment: 3 Vital Signs Time Taken: 10:00 Temperature (F): 98.6 Height (in): 66 Pulse (bpm): 85 Weight (lbs): 237 Respiratory Rate (breaths/min): 18 Body Mass Index (BMI): 38.2 Blood  Pressure (mmHg): 177/92 CYLEE, DATTILO ([id number]) [id number] Page 9 of 9 Reference Range: 80 - 120 mg / dl Electronic Signature(s) Signed: 06/09/2022 5:21:00 PM By: Midge Aver MSN RN CNS WTA Entered By: Midge Aver on 06/09/2022 10:01:36

## 2022-06-19 ENCOUNTER — Encounter: Payer: Medicare Other | Attending: Physician Assistant | Admitting: Physician Assistant

## 2022-06-19 DIAGNOSIS — I5042 Chronic combined systolic (congestive) and diastolic (congestive) heart failure: Secondary | ICD-10-CM | POA: Diagnosis not present

## 2022-06-19 DIAGNOSIS — I872 Venous insufficiency (chronic) (peripheral): Secondary | ICD-10-CM | POA: Diagnosis not present

## 2022-06-19 DIAGNOSIS — L97812 Non-pressure chronic ulcer of other part of right lower leg with fat layer exposed: Secondary | ICD-10-CM | POA: Diagnosis present

## 2022-06-19 DIAGNOSIS — I890 Lymphedema, not elsewhere classified: Secondary | ICD-10-CM | POA: Insufficient documentation

## 2022-06-19 DIAGNOSIS — N183 Chronic kidney disease, stage 3 unspecified: Secondary | ICD-10-CM | POA: Insufficient documentation

## 2022-06-19 DIAGNOSIS — I130 Hypertensive heart and chronic kidney disease with heart failure and stage 1 through stage 4 chronic kidney disease, or unspecified chronic kidney disease: Secondary | ICD-10-CM | POA: Insufficient documentation

## 2022-06-19 NOTE — Progress Notes (Signed)
Robin, [name] ([id number]) [id number] Page 1 of 9 Visit Report for 06/19/2022 Arrival Information Details Patient Name: Date of Service: Robin LLIFIELD RO GO CAITLAND, PORCHIA 06/19/2022 10:30 A M Medical Record Number: [id number] Patient Account Number: [id number] Date of Birth/Sex: Treating RN: Oct 25, 1945 (77 y.o. [name] Primary Care Emoni Whitworth: Frazier Richards Other Clinician: Massie Kluver Referring Clemon Devaul: Treating Ezell Melikian/Extender: Benay Pillow in Treatment: 4 Visit Information History Since Last Visit All ordered tests and consults were completed: No Patient Arrived: [name] Added or deleted any medications: No Arrival Time: 10:41 Any new allergies or adverse reactions: No Transfer Assistance: None Had a fall or experienced change in No Patient Identification Verified: Yes activities of daily living that may affect Secondary Verification Process Completed: Yes risk of falls: Patient Requires Transmission-Based Precautions: No Signs or symptoms of abuse/neglect since last visito No Patient Has Alerts: No Hospitalized since last visit: No Implantable device outside of the clinic excluding No cellular tissue based products placed in the center since last visit: Has Dressing in Place as Prescribed: Yes Has Compression in Place as Prescribed: Yes Pain Present Now: No Electronic Signature(s) Signed: 06/19/2022 1:15:46 PM By: Massie Kluver Entered By: Massie Kluver on 06/19/2022 10:42:24 -------------------------------------------------------------------------------- Clinic Level of Care Assessment Details Patient Name: Date of Service: Robin LLIFIELD Clark EDDIE, KOC 06/19/2022 10:30 A M Medical Record Number: [id number] Patient Account Number: [id number] Date of Birth/Sex: Treating RN: May 18, 1946 (77 y.o. [name] Primary Care Catrina Fellenz: Frazier Richards Other Clinician: Massie Kluver Referring  Dillan Candela: Treating Bula Cavalieri/Extender: Benay Pillow in Treatment: 4 Clinic Level of Care Assessment Items TOOL 1 Quantity Score []  - 0 Use when EandM and Procedure is performed on INITIAL visit ASSESSMENTS - Nursing Assessment / Reassessment []  - 0 General Physical Exam (combine w/ comprehensive assessment (listed just below) when performed on new pt. evals) [name], [name] ([id number]) [id number] Page 2 of 9 []  - 0 Comprehensive Assessment (HX, ROS, Risk Assessments, Wounds Hx, etc.) ASSESSMENTS - Wound and Skin Assessment / Reassessment []  - 0 Dermatologic / Skin Assessment (not related to wound area) ASSESSMENTS - Ostomy and/or Continence Assessment and Care []  - 0 Incontinence Assessment and Management []  - 0 Ostomy Care Assessment and Management (repouching, etc.) PROCESS - Coordination of Care []  - 0 Simple Patient / Family Education for ongoing care []  - 0 Complex (extensive) Patient / Family Education for ongoing care []  - 0 Staff obtains Programmer, systems, Records, T Results / Process Orders est []  - 0 Staff telephones HHA, Nursing Homes / Clarify orders / etc []  - 0 Routine Transfer to another Facility (non-emergent condition) []  - 0 Routine Hospital Admission (non-emergent condition) []  - 0 New Admissions / Biomedical engineer / Ordering NPWT Apligraf, etc. , []  - 0 Emergency Hospital Admission (emergent condition) PROCESS - Special Needs []  - 0 Pediatric / Minor Patient Management []  - 0 Isolation Patient Management []  - 0 Hearing / Language / Visual special needs []  - 0 Assessment of Community assistance (transportation, D/C planning, etc.) []  - 0 Additional assistance / Altered mentation []  - 0 Support Surface(s) Assessment (bed, cushion, seat, etc.) INTERVENTIONS - Miscellaneous []  - 0 External ear exam []  - 0 Patient Transfer (multiple staff / Civil Service fast streamer / Similar devices) []  -  0 Simple Staple / Suture removal (25 or less) []  - 0 Complex Staple / Suture removal (26 or more) []  - 0 Hypo/Hyperglycemic Management (do not check if billed separately) []  -  0 Ankle / Brachial Index (ABI) - do not check if billed separately Has the patient been seen at the hospital within the last three years: Yes Total Score: 0 Level Of Care: ____ Electronic Signature(s) Signed: 06/19/2022 1:15:46 PM By: Massie Kluver Entered By: Massie Kluver on 06/19/2022 11:01:10 -------------------------------------------------------------------------------- Compression Therapy Details Patient Name: Date of Service: [name], [name] 06/19/2022 10:30 A M Medical Record Number: [id number] Patient Account Number: [id number] Date of Birth/Sex: Treating RN: 1945-10-08 (77 y.o. [name] Primary Care Kei Mcelhiney: Frazier Richards Other Clinician: Deshanta, Lady ([id number]) [id number] Page 3 of 9 Referring Lorimer Tiberio: Treating Bleu Moisan/Extender: Benay Pillow in Treatment: 4 Compression Therapy Performed for Wound Assessment: Wound #1 Right,Lateral Lower Leg Performed By: Lenice Pressman, Angie, Compression Type: Three Layer Pre Treatment ABI: 0.9 Post Procedure Diagnosis Same as Pre-procedure Electronic Signature(s) Signed: 06/19/2022 1:15:46 PM By: Massie Kluver Entered By: Massie Kluver on 06/19/2022 11:00:01 -------------------------------------------------------------------------------- Encounter Discharge Information Details Patient Name: Date of Service: Robin LLIFIELD RO GO [name]. 06/19/2022 10:30 A M Medical Record Number: [id number] Patient Account Number: [id number] Date of Birth/Sex: Treating RN: Jun 07, 1946 (77 y.o. [name] Primary Care Aaryana Betke: Frazier Richards Other Clinician: Massie Kluver Referring Jailin Manocchio: Treating Katrine Radich/Extender: Benay Pillow in Treatment: 4 Encounter Discharge Information Items Discharge Condition: Stable Ambulatory Status: Cane Discharge Destination: Home Transportation: Private Auto Accompanied By: self Schedule Follow-up Appointment: Yes Clinical Summary of Care: Electronic Signature(s) Signed: 06/19/2022 1:15:46 PM By: Massie Kluver Entered By: Massie Kluver on 06/19/2022 11:14:27 -------------------------------------------------------------------------------- Lower Extremity Assessment Details Patient Name: Date of Service: Robin LLIFIELD RO GO Robin, [name] 06/19/2022 10:30 A M Medical Record Number: [id number] Patient Account Number: [id number] Date of Birth/Sex: Treating RN: [birth year] (77 y.o. [name] Primary Care Viney Acocella: Frazier Richards Other Clinician: Massie Kluver Referring Kewon Statler: Treating Saleema Weppler/Extender: Benay Pillow in Treatment: [address], Tenna Child ([id number]) [id number] Page 4 of 9 Edema Assessment Assessed: [Left: No] [Right: Yes] Edema: [Left: N] [Right: o] Calf Left: Right: Point of Measurement: 32 cm From Medial Instep 31 cm Ankle Left: Right: Point of Measurement: 10 cm From Medial Instep 18.8 cm Vascular Assessment Pulses: Dorsalis Pedis Palpable: [Right:Yes] Electronic Signature(s) Signed: 06/19/2022 1:15:46 PM By: Massie Kluver Signed: 06/19/2022 1:40:16 PM By: Rosalio Loud MSN RN CNS WTA Entered By: Massie Kluver on 06/19/2022 10:55:29 -------------------------------------------------------------------------------- Multi Wound Chart Details Patient Name: Date of Service: Robin LLIFIELD RO GO [name]. 06/19/2022 10:30 A M Medical Record Number: [id number] Patient Account Number: [id number] Date of Birth/Sex: Treating RN: 1945/11/05 (76 y.o. [name] Primary Care Kamea Dacosta: Frazier Richards Other Clinician: Massie Kluver Referring Lateefa Crosby: Treating  Sofia Vanmeter/Extender: Benay Pillow in Treatment: 4 Vital Signs Height(in): 66 Pulse(bpm): 91 Weight(lbs): 159 Blood Pressure(mmHg): 140/82 Body Mass Index(BMI): 38.2 Temperature(F): 97.7 Respiratory Rate(breaths/min): 18 [1:Photos:] [N/A:N/A] Right, Lateral Lower Leg N/A N/A Wound Location: Blister N/A N/A Wounding Event: Venous Leg Ulcer N/A N/A Primary Etiology: Congestive Heart Failure, N/A N/A Comorbid History: Hypertension 05/13/2022 N/A N/A Date Acquired: 4 N/A N/A Weeks of Treatment: Open N/A N/A Wound Status: LENIYA, BREIT ([id number]) [id number] Page 5 of 9 No N/A N/A Wound Recurrence: Yes N/A N/A Clustered Wound: 0.5x0.6x0.1 N/A N/A Measurements L x W x D (cm) 0.236 N/A N/A A (cm) : rea 0.024 N/A N/A Volume (cm) : 98.70% N/A N/A % Reduction in A rea: 98.70% N/A N/A % Reduction in Volume:  Partial Thickness N/A N/A Classification: Medium N/A N/A Exudate A mount: Serosanguineous N/A N/A Exudate Type: red, brown N/A N/A Exudate Color: None Present (0%) N/A N/A Granulation A mount: Large (67-100%) N/A N/A Necrotic A mount: Eschar, Adherent Slough N/A N/A Necrotic Tissue: Fascia: No N/A N/A Exposed Structures: Fat Layer (Subcutaneous Tissue): No Tendon: No Muscle: No Joint: No Bone: No Medium (34-66%) N/A N/A Epithelialization: Treatment Notes Electronic Signature(s) Signed: 06/19/2022 1:15:46 PM By: Betha Loa Entered By: Betha Loa on 06/19/2022 10:55:33 -------------------------------------------------------------------------------- Multi-Disciplinary Care Plan Details Patient Name: Date of Service: Robin LLIFIELD RO GO [name]. 06/19/2022 10:30 A M Medical Record Number: [id number] Patient Account Number: [id number] Date of Birth/Sex: Treating RN: 1945/11/14 (77 y.o. [name] Primary Care Darice Vicario: Einar Crow Other Clinician: Betha Loa Referring Konstantin Lehnen: Treating Nyshawn Gowdy/Extender: Johny Shears in Treatment: 4 Active Inactive Necrotic Tissue Nursing Diagnoses: Knowledge deficit related to management of necrotic/devitalized tissue Goals: Necrotic/devitalized tissue will be minimized in the wound bed Date Initiated: 05/19/2022 Target Resolution Date: 06/19/2022 Goal Status: Active Interventions: Assess patient pain level pre-, during and post procedure and prior to discharge Provide education on necrotic tissue and debridement process Treatment Activities: Biologic debridement : 05/19/2022 Excisional debridement : 05/19/2022 Notes: Wound/Skin Impairment Nursing Diagnoses: Robin, [name] ([id number]) [id number].pdf Page 6 of 9 Knowledge deficit related to ulceration/compromised skin integrity Goals: Patient/caregiver will verbalize understanding of skin care regimen Date Initiated: 05/19/2022 Target Resolution Date: 06/19/2022 Goal Status: Active Ulcer/skin breakdown will have a volume reduction of 30% by week 4 Date Initiated: 05/19/2022 Target Resolution Date: 06/19/2022 Goal Status: Active Ulcer/skin breakdown will have a volume reduction of 50% by week 8 Date Initiated: 05/19/2022 Target Resolution Date: 07/20/2022 Goal Status: Active Ulcer/skin breakdown will have a volume reduction of 80% by week 12 Date Initiated: 05/19/2022 Target Resolution Date: 08/18/2022 Goal Status: Active Ulcer/skin breakdown will heal within 14 weeks Date Initiated: 05/19/2022 Target Resolution Date: 09/18/2022 Goal Status: Active Interventions: Assess patient/caregiver ability to obtain necessary supplies Assess patient/caregiver ability to perform ulcer/skin care regimen upon admission and as needed Assess ulceration(s) every visit Notes: Electronic Signature(s) Signed: 06/19/2022 1:15:46 PM By: Betha Loa Signed: 06/19/2022 1:40:16 PM By: Midge Aver MSN RN CNS  WTA Entered By: Betha Loa on 06/19/2022 11:13:53 -------------------------------------------------------------------------------- Pain Assessment Details Patient Name: Date of Service: Robin LLIFIELD RO GO [name]. 06/19/2022 10:30 A M Medical Record Number: [id number] Patient Account Number: [id number] Date of Birth/Sex: Treating RN: 11-05-1945 (77 y.o. [name] Primary Care Korine Winton: Einar Crow Other Clinician: Betha Loa Referring Fontaine Hehl: Treating Aristotelis Vilardi/Extender: Johny Shears in Treatment: 4 Active Problems Location of Pain Severity and Description of Pain Patient Has Paino No Site Locations Robin, [name] ([id number]) [phone].pdf Page 7 of 9 Pain Management and Medication Current Pain Management: Electronic Signature(s) Signed: 06/19/2022 1:15:46 PM By: Betha Loa Signed: 06/19/2022 1:40:16 PM By: Midge Aver MSN RN CNS WTA Entered By: Betha Loa on 06/19/2022 10:53:16 -------------------------------------------------------------------------------- Patient/Caregiver Education Details Patient Name: Date of Service: Robin LLIFIELD RO GO [name] 1/5/2024andnbsp10:30 A M Medical Record Number: [id number] Patient Account Number: [id number] Date of Birth/Gender: Treating RN: 09-17-1945 (76 y.o. [name] Primary Care Physician: Einar Crow Other Clinician: Betha Loa Referring Physician: Treating Physician/Extender: Johny Shears in Treatment: 4 Education Assessment Education Provided To: Patient Education Topics Provided Wound/Skin Impairment: Handouts: Other: continue wound care as directed Methods: Explain/Verbal Responses: State content correctly Electronic Signature(s) Signed: 06/19/2022 1:15:46  PM By: Betha Loa Entered By: Betha Loa on 06/19/2022  11:13:44 -------------------------------------------------------------------------------- Wound Assessment Details Patient Name: Date of Service: Robin LLIFIELD RO GO [name], [name] 06/19/2022 10:30 A M Medical Record Number: [id number] Patient Account Number: [id number] Date of Birth/Sex: Treating RN: 1945/06/23 (77 y.o. [name] Primary Care Marlow Hendrie: Einar Crow Other Clinician: Betha Loa Referring Sion Thane: Treating Melysa Schroyer/Extender: Alvino Chapel Weeks in Treatment: 4 Wound Status [name], [name] ([id number]) [id number] Page 8 of 9 Wound Number: 1 Primary Etiology: Venous Leg Ulcer Wound Location: Right, Lateral Lower Leg Wound Status: Open Wounding Event: Blister Comorbid History: Congestive Heart Failure, Hypertension Date Acquired: 05/13/2022 Weeks Of Treatment: 4 Clustered Wound: Yes Photos Wound Measurements Length: (cm) 0.5 Width: (cm) 0.6 Depth: (cm) 0.1 Area: (cm) 0.236 Volume: (cm) 0.024 % Reduction in Area: 98.7% % Reduction in Volume: 98.7% Epithelialization: Medium (34-66%) Wound Description Classification: Partial Thickness Exudate Amount: Medium Exudate Type: Serosanguineous Exudate Color: red, brown Foul Odor After Cleansing: No Slough/Fibrino Yes Wound Bed Granulation Amount: None Present (0%) Exposed Structure Necrotic Amount: Large (67-100%) Fascia Exposed: No Necrotic Quality: Eschar, Adherent Slough Fat Layer (Subcutaneous Tissue) Exposed: No Tendon Exposed: No Muscle Exposed: No Joint Exposed: No Bone Exposed: No Treatment Notes Wound #1 (Lower Leg) Wound Laterality: Right, Lateral Cleanser Peri-Wound Care Topical Primary Dressing Prisma 4.34 (in) Discharge Instruction: Moisten w/normal saline or sterile water; Cover wound as directed. Do not remove from wound bed. Secondary Dressing ABD Pad 5x9 (in/in) Discharge Instruction: Cover with ABD pad Secured  With Compression Wrap 3-LAYER WRAP - Profore Lite LF 3 Multilayer Compression Bandaging System Discharge Instruction: Apply 3 multi-layer wrap as prescribed. Compression Stockings Circaid Juxta Lite Compression Wrap Quantity: 1 Right Leg Compression Amount: 20-30 mmHg Discharge Instruction: Apply Circaid Juxta Lite Compression Wrap as directed Add-Ons LAKETHA, LEOPARD ([id number]) [phone].pdf Page 9 of 9 Electronic Signature(s) Signed: 06/19/2022 1:15:46 PM By: Betha Loa Signed: 06/19/2022 1:40:16 PM By: Midge Aver MSN RN CNS WTA Entered By: Betha Loa on 06/19/2022 10:53:50 -------------------------------------------------------------------------------- Vitals Details Patient Name: Date of Service: Robin LLIFIELD RO GO Robin Poot J. 06/19/2022 10:30 A M Medical Record Number: [id number] Patient Account Number: [id number] Date of Birth/Sex: Treating RN: March 07, 1946 (77 y.o. [name] Primary Care Lulamae Skorupski: Einar Crow Other Clinician: Betha Loa Referring Anastasios Melander: Treating Naraya Stoneberg/Extender: Johny Shears in Treatment: 4 Vital Signs Time Taken: 10:45 Temperature (F): 97.7 Height (in): 66 Pulse (bpm): 91 Weight (lbs): 237 Respiratory Rate (breaths/min): 18 Body Mass Index (BMI): 38.2 Blood Pressure (mmHg): 140/82 Reference Range: 80 - 120 mg / dl Electronic Signature(s) Signed: 06/19/2022 1:15:46 PM By: Betha Loa Entered By: Betha Loa on 06/19/2022 10:45:33

## 2022-06-19 NOTE — Progress Notes (Addendum)
[name], [name] ([id number]) [id number] Page 1 of 7 Visit Report for 06/19/2022 Chief Complaint Document Details Patient Name: Date of Service: [name], MAVES 06/19/2022 10:30 A M Medical Record Number: [id number] Patient Account Number: [id number] Date of Birth/Sex: Treating RN: 05-21-1946 (77 y.o. Drema [name] Primary Care Provider: Frazier Richards Other Clinician: Massie Kluver Referring Provider: Treating Provider/Extender: Benay Pillow in Treatment: 4 Information Obtained from: Patient Chief Complaint Right LE Ulcer Electronic Signature(s) Signed: 06/19/2022 10:45:10 AM By: Worthy Keeler PA-C Entered By: Worthy Keeler on 06/19/2022 10:45:09 -------------------------------------------------------------------------------- HPI Details Patient Name: Date of Service: [name]. 06/19/2022 10:30 A M Medical Record Number: [id number] Patient Account Number: [id number] Date of Birth/Sex: Treating RN: Mar 20, 1946 (77 y.o. Drema [name] Primary Care Provider: Frazier Richards Other Clinician: Massie Kluver Referring Provider: Treating Provider/Extender: Benay Pillow in Treatment: 4 History of Present Illness HPI Description: 05-19-2022 upon evaluation today patient appears to be doing poorly in regard to wounds of the right lateral lower extremity. The good news is these do not appear to be too significant the bad news is they are somewhat painful. This on 05-13-2022 started more as blisters and have continued to worsen. Fortunately I do not see any signs of severe infection at this point which is good news. Nonetheless I do believe there is some evidence of infection and cellulitis here. Patient does have a history of chronic venous insufficiency, lymphedema, chronic kidney disease stage III, congestive heart failure, and personal history  of traumatic brain injury. 05-26-2022 upon evaluation today patient actually appears to be doing much better in regard to her leg ulcer. She has been tolerating the dressing changes without complication and overall I am actually extremely pleased with where we stand today. Fortunately there does not appear to be any signs of infection locally nor systemically at this point I do believe the antibiotics have been beneficial. 06-02-2022 upon evaluation today patient appears to be doing much better in regard to her wound. She is actually showing signs of excellent improvement the compression wrapping seems to be doing quite well for her. Fortunately I do not see any signs of infection locally or systemically at this time which is great news. No fevers, chills, nausea, vomiting, or diarrhea. 12/26; the patient only has a very small open area left on the right lateral lower leg. Her edema control is excellent. We have ordered her juxta lites in an FRONNIE, URTON ([id number]) [id number] Page 2 of 7 attempt to control the swelling in her legs. She tells [name] that only 1 stocking was sent for the right leg. She cannot get standard over the toe stockings on 06-19-2021 upon evaluation today patient appears to be doing excellent in regard to her wound. She is actually tolerating the dressing changes without complication. Fortunately I do not see any signs of infection locally nor systemically which is great news and overall I do believe that we are headed in the right direction. Electronic Signature(s) Signed: 06/19/2022 11:29:45 AM By: Worthy Keeler PA-C Entered By: Worthy Keeler on 06/19/2022 11:29:45 -------------------------------------------------------------------------------- Physical Exam Details Patient Name: Date of Service: [name] [name], [name] 06/19/2022 10:30 A M Medical Record Number: [id number] Patient Account Number: [id number] Date of  Birth/Sex: Treating RN: [birth year] (77 y.o. Drema [name] Primary Care Provider: Frazier Richards Other Clinician: Massie Kluver Referring Provider: Treating Provider/Extender: Benay Pillow  in Treatment: 4 Constitutional Obese and well-hydrated in no acute distress. Respiratory normal breathing without difficulty. Psychiatric this patient is able to make decisions and demonstrates good insight into disease process. Alert and Oriented x 3. pleasant and cooperative. Notes Patient's wound bed showed signs of good granulation epithelization at this point. Her wound is looking much better I think the compression wrap is really helping here I do think switching to a silver collagen dressing could be beneficial over the silver alginate since this is not draining nearly as much. Electronic Signature(s) Signed: 06/19/2022 11:30:02 AM By: Lenda Kelp PA-C Entered By: Lenda Kelp on 06/19/2022 11:30:01 -------------------------------------------------------------------------------- Physician Orders Details Patient Name: Date of Service: [name] 06/19/2022 10:30 A M Medical Record Number: [id number] Patient Account Number: [id number] Date of Birth/Sex: Treating RN: 09/08/1945 (77 y.o. [name] Primary Care Provider: Einar Crow Other Clinician: Betha Loa Referring Provider: Treating Provider/Extender: Johny Shears in Treatment: 4 Verbal / Phone Orders: No Diagnosis Coding Robin, [name] ([id number]) [id number] Page 3 of 7 ICD-10 Coding Code Description I87.331 Chronic venous hypertension (idiopathic) with ulcer and inflammation of right lower extremity I89.0 Lymphedema, not elsewhere classified L97.812 Non-pressure chronic ulcer of other part of right lower leg with fat layer exposed N18.30 Chronic kidney disease, stage 3 unspecified I50.42  Chronic combined systolic (congestive) and diastolic (congestive) heart failure Z87.820 Personal history of traumatic brain injury Follow-up Appointments Return Appointment in 1 week. Bathing/ Shower/ Hygiene May shower with wound dressing protected with water repellent cover or cast protector. No tub bath. Edema Control - Lymphedema / Segmental Compressive Device / Other Optional: One layer of unna paste to top of compression wrap (to act as an anchor). 3 Layer Compression System for Lymphedema. - right lower leg Elevate, Exercise Daily and A void Standing for Long Periods of Time. Elevate legs to the level of the heart and pump ankles as often as possible Elevate leg(s) parallel to the floor when sitting. Wound Treatment Wound #1 - Lower Leg Wound Laterality: Right, Lateral Prim Dressing: Prisma 4.34 (in) 1 x Per Week/30 Days ary Discharge Instructions: Moisten w/normal saline or sterile water; Cover wound as directed. Do not remove from wound bed. Secondary Dressing: ABD Pad 5x9 (in/in) 1 x Per Week/30 Days Discharge Instructions: Cover with ABD pad Compression Wrap: 3-LAYER WRAP - Profore Lite LF 3 Multilayer Compression Bandaging System 1 x Per Week/30 Days Discharge Instructions: Apply 3 multi-layer wrap as prescribed. Compression Stockings: Circaid Juxta Lite Compression Wrap Right Leg Compression Amount: 20-30 mmHG Discharge Instructions: Apply Circaid Juxta Lite Compression Wrap as directed Electronic Signature(s) Signed: 06/19/2022 1:15:46 PM By: Betha Loa Signed: 06/19/2022 1:38:11 PM By: Lenda Kelp PA-C Entered By: Betha Loa on 06/19/2022 11:00:35 -------------------------------------------------------------------------------- Problem List Details Patient Name: Date of Service: [name] 06/19/2022 10:30 A M Medical Record Number: [id number] Patient Account Number: [id number] Date of Birth/Sex: Treating RN: 06/19/1945 (77 y.o. [name] Primary Care Provider: Einar Crow Other Clinician: Betha Loa Referring Provider: Treating Provider/Extender: Johny Shears in Treatment: 4 Active Problems ICD-10 Encounter Code Description Active Date MDM Diagnosis I87.331 Chronic venous hypertension (idiopathic) with ulcer and inflammation of right 05/19/2022 No Yes AZIAH, KAISER ([id number]) [id number] Page 4 of 7 lower extremity I89.0 Lymphedema, not elsewhere classified 05/19/2022 No Yes L97.812 Non-pressure chronic ulcer of other part of right lower leg with fat layer 05/19/2022 No Yes exposed  N18.30 Chronic kidney disease, stage 3 unspecified 05/19/2022 No Yes I50.42 Chronic combined systolic (congestive) and diastolic (congestive) heart failure 05/19/2022 No Yes Z87.820 Personal history of traumatic brain injury 05/19/2022 No Yes Inactive Problems Resolved Problems Electronic Signature(s) Signed: 06/19/2022 10:45:06 AM By: Lenda Kelp PA-C Entered By: Lenda Kelp on 06/19/2022 10:45:05 -------------------------------------------------------------------------------- Progress Note Details Patient Name: Date of Service: [name] 06/19/2022 10:30 A M Medical Record Number: [id number] Patient Account Number: [id number] Date of Birth/Sex: Treating RN: Oct 15, 1945 (77 y.o. [name] Primary Care Provider: Einar Crow Other Clinician: Betha Loa Referring Provider: Treating Provider/Extender: Johny Shears in Treatment: 4 Subjective Chief Complaint Information obtained from Patient Right LE Ulcer History of Present Illness (HPI) 05-19-2022 upon evaluation today patient appears to be doing poorly in regard to wounds of the right lateral lower extremity. The good news is these do not appear to be too significant the bad news is they are somewhat painful. This on 05-13-2022 started  more as blisters and have continued to worsen. Fortunately I do not see any signs of severe infection at this point which is good news. Nonetheless I do believe there is some evidence of infection and cellulitis here. Patient does have a history of chronic venous insufficiency, lymphedema, chronic kidney disease stage III, congestive heart failure, and personal history of traumatic brain injury. 05-26-2022 upon evaluation today patient actually appears to be doing much better in regard to her leg ulcer. She has been tolerating the dressing changes without complication and overall I am actually extremely pleased with where we stand today. Fortunately there does not appear to be any signs of infection locally nor systemically at this point I do believe the antibiotics have been beneficial. 06-02-2022 upon evaluation today patient appears to be doing much better in regard to her wound. She is actually showing signs of excellent improvement the compression wrapping seems to be doing quite well for her. Fortunately I do not see any signs of infection locally or systemically at this time which is great TWINKLE, SOCKWELL ([id number]) [id number] Page 5 of 7 news. No fevers, chills, nausea, vomiting, or diarrhea. 12/26; the patient only has a very small open area left on the right lateral lower leg. Her edema control is excellent. We have ordered her juxta lites in an attempt to control the swelling in her legs. She tells [name] that only 1 stocking was sent for the right leg. She cannot get standard over the toe stockings on 06-19-2021 upon evaluation today patient appears to be doing excellent in regard to her wound. She is actually tolerating the dressing changes without complication. Fortunately I do not see any signs of infection locally nor systemically which is great news and overall I do believe that we are headed in the right  direction. Objective Constitutional Obese and well-hydrated in no acute distress. Vitals Time Taken: 10:45 AM, Height: 66 in, Weight: 237 lbs, BMI: 38.2, Temperature: 97.7 F, Pulse: 91 bpm, Respiratory Rate: 18 breaths/min, Blood Pressure: 140/82 mmHg. Respiratory normal breathing without difficulty. Psychiatric this patient is able to make decisions and demonstrates good insight into disease process. Alert and Oriented x 3. pleasant and cooperative. General Notes: Patient's wound bed showed signs of good granulation epithelization at this point. Her wound is looking much better I think the compression wrap is really helping here I do think switching to a silver collagen dressing could be beneficial over the silver alginate since this is  not draining nearly as much. Integumentary (Hair, Skin) Wound #1 status is Open. Original cause of wound was Blister. The date acquired was: 05/13/2022. The wound has been in treatment 4 weeks. The wound is located on the Right,Lateral Lower Leg. The wound measures 0.5cm length x 0.6cm width x 0.1cm depth; 0.236cm^2 area and 0.024cm^3 volume. There is a medium amount of serosanguineous drainage noted. There is no granulation within the wound bed. There is a large (67-100%) amount of necrotic tissue within the wound bed including Eschar and Adherent Slough. Assessment Active Problems ICD-10 Chronic venous hypertension (idiopathic) with ulcer and inflammation of right lower extremity Lymphedema, not elsewhere classified Non-pressure chronic ulcer of other part of right lower leg with fat layer exposed Chronic kidney disease, stage 3 unspecified Chronic combined systolic (congestive) and diastolic (congestive) heart failure Personal history of traumatic brain injury Procedures Wound #1 Pre-procedure diagnosis of Wound #1 is a Venous Leg Ulcer located on the Right,Lateral Lower Leg . There was a Three Layer Compression Therapy Procedure with a  pre-treatment ABI of 0.9 by Massie Kluver. Post procedure Diagnosis Wound #1: Same as Pre-Procedure Plan Follow-up Appointments: Return Appointment in 1 week. Bathing/ Shower/ Hygiene: May shower with wound dressing protected with water repellent cover or cast protector. No tub bath. Edema Control - Lymphedema / Segmental Compressive Device / Other: Optional: One layer of unna paste to top of compression wrap (to act as an anchor). 3 Layer Compression System for Lymphedema. - right lower leg Elevate, Exercise Daily and Avoid Standing for Long Periods of Time. Elevate legs to the level of the heart and pump ankles as often as possible Elevate leg(s) parallel to the floor when sitting. JOCILYNN, GRADE ([id number]) [id number] Page 6 of 7 WOUND #1: - Lower Leg Wound Laterality: Right, Lateral Prim Dressing: Prisma 4.34 (in) 1 x Per Week/30 Days ary Discharge Instructions: Moisten w/normal saline or sterile water; Cover wound as directed. Do not remove from wound bed. Secondary Dressing: ABD Pad 5x9 (in/in) 1 x Per Week/30 Days Discharge Instructions: Cover with ABD pad Com pression Wrap: 3-LAYER WRAP - Profore Lite LF 3 Multilayer Compression Bandaging System 1 x Per Week/30 Days Discharge Instructions: Apply 3 multi-layer wrap as prescribed. Com pression Stockings: Circaid Juxta Lite Compression Wrap Compression Amount: 20-30 mmHg (right) Discharge Instructions: Apply Circaid Juxta Lite Compression Wrap as directed 1. I am going to suggest that we have the patient continue to monitor for any signs of infection or worsening. Obviously if anything changes she should let me know. 2. I am also can recommend a 3 layer compression wrap be initiated which I think should really do quite well for her. 3. I would also suggest that we look toward switching to the juxta lite compression wrap as soon as she heals although were not quite there at this  point. We will see patient back for reevaluation in 1 week here in the clinic. If anything worsens or changes patient will contact our office for additional recommendations. Electronic Signature(s) Signed: 06/19/2022 11:30:32 AM By: Worthy Keeler PA-C Entered By: Worthy Keeler on 06/19/2022 11:30:32 -------------------------------------------------------------------------------- SuperBill Details Patient Name: Date of Service: [name] 06/19/2022 Medical Record Number: [id number] Patient Account Number: [id number] Date of Birth/Sex: Treating RN: 1945-07-15 (77 y.o. Drema [name] Primary Care Provider: Frazier Richards Other Clinician: Massie Kluver Referring Provider: Treating Provider/Extender: Benay Pillow in Treatment: 4 Diagnosis Coding ICD-10 Codes Code Description [phone] Chronic venous hypertension (  idiopathic) with ulcer and inflammation of right lower extremity I89.0 Lymphedema, not elsewhere classified L97.812 Non-pressure chronic ulcer of other part of right lower leg with fat layer exposed N18.30 Chronic kidney disease, stage 3 unspecified I50.42 Chronic combined systolic (congestive) and diastolic (congestive) heart failure Z87.820 Personal history of traumatic brain injury Facility Procedures : CPT4 Code: [id number] Description: (Facility Use Only) [phone] - APPLY MULTLAY COMPRS LWR RT LEG ICD-10 Diagnosis Description I87.331 Chronic venous hypertension (idiopathic) with ulcer and inflammation of right lower ex Modifier: tremity Quantity: 1 Physician Procedures : CPT4 Code Description Modifier [id number] - WC PHYS LEVEL 3 - EST PT ICD-10 Diagnosis Description I87.331 Chronic venous hypertension (idiopathic) with ulcer and inflammation of right lower extremity I89.0 Lymphedema, not elsewhere classified  L97.812 Non-pressure chronic ulcer of other part of right lower leg with fat layer exposed NNEKA, BLANDA ([id number]) [id number] Page 7 of N18.30 Chronic kidney disease, stage 3 unspecified Quantity: 1 7 Electronic Signature(s) Signed: 06/19/2022 11:30:47 AM By: Lenda Kelp PA-C Entered By: Lenda Kelp on 06/19/2022 11:30:46

## 2022-06-23 DIAGNOSIS — L97812 Non-pressure chronic ulcer of other part of right lower leg with fat layer exposed: Secondary | ICD-10-CM | POA: Diagnosis not present

## 2022-06-25 NOTE — Progress Notes (Signed)
Robin, [name] ([id number]) [id number] Page 1 of 4 Visit Report for 06/23/2022 Arrival Information Details Patient Name: Date of Service: [name] 06/23/2022 10:30 A M Medical Record Number: [id number] Patient Account Number: [id number] Date of Birth/Sex: Treating RN: 08/30/1945 (77 y.o. [name] Primary Care Lakely [place]f: Frazier Richards Other Clinician: Massie Kluver Referring Venida Tsukamoto: Treating Doni Bacha/Extender: Benay Pillow in Treatment: 5 Visit Information History Since Last Visit All ordered tests and consults were completed: No Patient Arrived: [name] Added or deleted any medications: No Arrival Time: 10:49 Any new allergies or adverse reactions: No Transfer Assistance: None Had a fall or experienced change in No Patient Requires Transmission-Based Precautions: No activities of daily living that may affect Patient Has Alerts: No risk of falls: Signs or symptoms of abuse/neglect since last visito No Hospitalized since last visit: No Implantable device outside of the clinic excluding No cellular tissue based products placed in the center since last visit: Has Dressing in Place as Prescribed: Yes Has Compression in Place as Prescribed: Yes Pain Present Now: No Electronic Signature(s) Signed: 06/24/2022 5:10:32 PM By: Massie Kluver Entered By: Massie Kluver on 06/23/2022 10:51:28 -------------------------------------------------------------------------------- Clinic Level of Care Assessment Details Patient Name: Date of Service: [name] 06/23/2022 10:30 A M Medical Record Number: [id number] Patient Account Number: [id number] Date of Birth/Sex: Treating RN: 1945-07-12 (77 y.o. [name] Primary Care Rome Schlauch: Frazier Richards Other Clinician: Massie Kluver Referring Lacrisha Bielicki: Treating Devery Murgia/Extender: Benay Pillow in  Treatment: 5 Clinic Level of Care Assessment Items TOOL 1 Quantity Score []  - 0 Use when EandM and Procedure is performed on INITIAL visit ASSESSMENTS - Nursing Assessment / Reassessment []  - 0 General Physical Exam (combine w/ comprehensive assessment (listed just below) when performed on new pt. evals) [name] ([id number]) [id number] Page 2 of 4 []  - 0 Comprehensive Assessment (HX, ROS, Risk Assessments, Wounds Hx, etc.) ASSESSMENTS - Wound and Skin Assessment / Reassessment []  - 0 Dermatologic / Skin Assessment (not related to wound area) ASSESSMENTS - Ostomy and/or Continence Assessment and Care []  - 0 Incontinence Assessment and Management []  - 0 Ostomy Care Assessment and Management (repouching, etc.) PROCESS - Coordination of Care []  - 0 Simple Patient / Family Education for ongoing care []  - 0 Complex (extensive) Patient / Family Education for ongoing care []  - 0 Staff obtains Programmer, systems, Records, T Results / Process Orders est []  - 0 Staff telephones HHA, Nursing Homes / Clarify orders / etc []  - 0 Routine Transfer to another Facility (non-emergent condition) []  - 0 Routine Hospital Admission (non-emergent condition) []  - 0 New Admissions / Biomedical engineer / Ordering NPWT Apligraf, etc. , []  - 0 Emergency Hospital Admission (emergent condition) PROCESS - Special Needs []  - 0 Pediatric / Minor Patient Management []  - 0 Isolation Patient Management []  - 0 Hearing / Language / Visual special needs []  - 0 Assessment of Community assistance (transportation, D/C planning, etc.) []  - 0 Additional assistance / Altered mentation []  - 0 Support Surface(s) Assessment (bed, cushion, seat, etc.) INTERVENTIONS - Miscellaneous []  - 0 External ear exam []  - 0 Patient Transfer (multiple staff / Civil Service fast streamer / Similar devices) []  - 0 Simple Staple / Suture removal (25 or less) []  - 0 Complex Staple / Suture  removal (26 or more) []  - 0 Hypo/Hyperglycemic Management (do not check if billed separately) []  - 0 Ankle / Brachial Index (ABI) - do not  check if billed separately Has the patient been seen at the hospital within the last three years: Yes Total Score: 0 Level Of Care: ____ Electronic Signature(s) Signed: 06/24/2022 5:10:32 PM By: Massie Kluver Entered By: Massie Kluver on 06/23/2022 11:07:36 -------------------------------------------------------------------------------- Compression Therapy Details Patient Name: Date of Service: Robin LLIFIELD RO GO Robin Cork. 06/23/2022 10:30 A M Medical Record Number: [id number] Patient Account Number: [id number] Date of Birth/Sex: Treating RN: 1946-04-09 (77 y.o. [name] Primary Care Cesare Sumlin: Frazier Richards Other Clinician: Mada, Sadik ([id number]) [id number] Page 3 of 4 Referring Latravion Graves: Treating Abigayle Wilinski/Extender: Benay Pillow in Treatment: 5 Compression Therapy Performed for Wound Assessment: Wound #1 Right,Lateral Lower Leg Performed By: [name], Compression Type: Three Layer Pre Treatment ABI: 0.9 Electronic Signature(s) Signed: 06/24/2022 5:10:32 PM By: Massie Kluver Entered By: Massie Kluver on 06/23/2022 10:52:33 -------------------------------------------------------------------------------- Encounter Discharge Information Details Patient Name: Date of Service: Robin LLIFIELD RO GO Robin Cork. 06/23/2022 10:30 A M Medical Record Number: [id number] Patient Account Number: [id number] Date of Birth/Sex: Treating RN: Dec 12, 1945 (77 y.o. [name] Primary Care Trina Asch: Frazier Richards Other Clinician: Massie Kluver Referring Zyhir Cappella: Treating Antonyo Hinderer/Extender: Benay Pillow in Treatment: 5 Encounter Discharge Information Items Discharge Condition: Stable Ambulatory Status:  Cane Discharge Destination: Home Transportation: Private Auto Accompanied By: self Schedule Follow-up Appointment: Yes Clinical Summary of Care: Electronic Signature(s) Signed: 06/24/2022 5:10:32 PM By: Massie Kluver Entered By: Massie Kluver on 06/23/2022 10:53:23 -------------------------------------------------------------------------------- Wound Assessment Details Patient Name: Date of Service: Robin LLIFIELD RO GO JEILYN, REZNIK 06/23/2022 10:30 A M Medical Record Number: [id number] Patient Account Number: [id number] Date of Birth/Sex: Treating RN: [birth year] (77 y.o. [name] Primary Care Tarra Pence: Frazier Richards Other Clinician: Massie Kluver Referring Joao Mccurdy: Treating Denea Cheaney/Extender: Elvina Sidle Weeks in Treatment: 5 Wound Status Wound Number: 1 Primary Etiology: Venous Leg Ulcer Wound Location: Right, Lateral Lower Leg Wound Status: Open Wounding Event: Blister Comorbid History: Congestive Heart Failure, Hypertension JERAH, ESTY ([id number]) [id number] Page 4 of 4 Date Acquired: 05/13/2022 Weeks Of Treatment: 5 Clustered Wound: Yes Wound Measurements Length: (cm) 0.5 Width: (cm) 0.6 Depth: (cm) 0.1 Area: (cm) 0.236 Volume: (cm) 0.024 % Reduction in Area: 98.7% % Reduction in Volume: 98.7% Epithelialization: Medium (34-66%) Wound Description Classification: Partial Thickness Exudate Amount: Medium Exudate Type: Serosanguineous Exudate Color: red, brown Foul Odor After Cleansing: No Slough/Fibrino Yes Wound Bed Granulation Amount: None Present (0%) Exposed Structure Necrotic Amount: Large (67-100%) Fascia Exposed: No Necrotic Quality: Eschar, Adherent Slough Fat Layer (Subcutaneous Tissue) Exposed: No Tendon Exposed: No Muscle Exposed: No Joint Exposed: No Bone Exposed: No Treatment Notes Wound #1 (Lower Leg) Wound Laterality: Right, Lateral Cleanser Peri-Wound  Care Topical Primary Dressing Prisma 4.34 (in) Discharge Instruction: Moisten w/normal saline or sterile water; Cover wound as directed. Do not remove from wound bed. Secondary Dressing ABD Pad 5x9 (in/in) Discharge Instruction: Cover with ABD pad Secured With Compression Wrap 3-LAYER WRAP - Profore Lite LF 3 Multilayer Compression Bandaging System Discharge Instruction: Apply 3 multi-layer wrap as prescribed. Compression Stockings Circaid Juxta Lite Compression Wrap Quantity: 1 Right Leg Compression Amount: 20-30 mmHg Discharge Instruction: Apply Circaid Juxta Lite Compression Wrap as directed Add-Ons Electronic Signature(s) Signed: 06/24/2022 5:10:32 PM By: Massie Kluver Signed: 06/25/2022 3:17:21 PM By: Gretta Cool, BSN, RN, CWS, Kim RN, BSN Entered By: Massie Kluver on 06/23/2022 10:51:46

## 2022-06-25 NOTE — Progress Notes (Signed)
[name], [name] ([id number]) [id number] Page 1 of 2 Visit Report for 06/23/2022 Physician Orders Details Patient Name: Date of Service: [name] [name], [name] 06/23/2022 10:30 A M Medical Record Number: [id number] Patient Account Number: [id number] Date of Birth/Sex: Treating RN: 07-22-1945 (76 y.o. [name] Primary Care Provider: Frazier Richards Other Clinician: Massie Kluver Referring Provider: Treating Provider/Extender: Benay Pillow in Treatment: 5 Verbal / Phone Orders: No Diagnosis Coding Follow-up Appointments Return Appointment in 1 week. Bathing/ Shower/ Hygiene May shower with wound dressing protected with water repellent cover or cast protector. No tub bath. Edema Control - Lymphedema / Segmental Compressive Device / Other Optional: One layer of unna paste to top of compression wrap (to act as an anchor). 3 Layer Compression System for Lymphedema. - right lower leg Elevate, Exercise Daily and A void Standing for Long Periods of Time. Elevate legs to the level of the heart and pump ankles as often as possible Elevate leg(s) parallel to the floor when sitting. Wound Treatment Wound #1 - Lower Leg Wound Laterality: Right, Lateral Prim Dressing: Prisma 4.34 (in) 1 x Per Week/30 Days ary Discharge Instructions: Moisten w/normal saline or sterile water; Cover wound as directed. Do not remove from wound bed. Secondary Dressing: ABD Pad 5x9 (in/in) 1 x Per Week/30 Days Discharge Instructions: Cover with ABD pad Compression Wrap: 3-LAYER WRAP - Profore Lite LF 3 Multilayer Compression Bandaging System 1 x Per Week/30 Days Discharge Instructions: Apply 3 multi-layer wrap as prescribed. Compression Stockings: Circaid Juxta Lite Compression Wrap Right Leg Compression Amount: 20-30 mmHG Discharge Instructions: Apply Circaid Juxta Lite Compression Wrap as directed Electronic Signature(s) Signed:  06/23/2022 2:11:00 PM By: Worthy Keeler PA-C Signed: 06/24/2022 5:10:32 PM By: Massie Kluver Entered By: Massie Kluver on 06/23/2022 10:52:58 [name] ([id number]) [id number] Page 2 of 2 -------------------------------------------------------------------------------- SuperBill Details Patient Name: Date of Service: [name] [name], [name] 06/23/2022 Medical Record Number: [id number] Patient Account Number: [id number] Date of Birth/Sex: Treating RN: 1946-02-17 (77 y.o. 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

## 2022-07-02 ENCOUNTER — Encounter: Payer: Medicare Other | Admitting: Physician Assistant

## 2022-07-02 DIAGNOSIS — L97812 Non-pressure chronic ulcer of other part of right lower leg with fat layer exposed: Secondary | ICD-10-CM | POA: Diagnosis not present

## 2022-07-02 NOTE — Progress Notes (Addendum)
[name], [name] ([id number]) [id number] Page 1 of 7 Visit Report for 07/02/2022 Chief Complaint Document Details Patient Name: Date of Service: [name], [name] 07/02/2022 9:30 A M Medical Record Number: [id number] Patient Account Number: [id number] Date of Birth/Sex: Treating RN: 05-12-1946 (77 y.o. [name] Primary Care Provider: Einar Crow Other Clinician: Referring Provider: Treating Provider/Extender: Johny Shears in Treatment: 6 Information Obtained from: Patient Chief Complaint Right LE Ulcer Electronic Signature(s) Signed: 07/02/2022 9:36:56 AM By: Lenda Kelp PA-C Entered By: Lenda Kelp on 07/02/2022 09:36:56 -------------------------------------------------------------------------------- HPI Details Patient Name: Date of Service: [name]. 07/02/2022 9:30 A M Medical Record Number: [id number] Patient Account Number: [id number] Date of Birth/Sex: Treating RN: 1946-02-14 (77 y.o. [name] Primary Care Provider: Einar Crow Other Clinician: Referring Provider: Treating Provider/Extender: Johny Shears in Treatment: 6 History of Present Illness HPI Description: 05-19-2022 upon evaluation today patient appears to be doing poorly in regard to wounds of the right lateral lower extremity. The good news is these do not appear to be too significant the bad news is they are somewhat painful. This on 05-13-2022 started more as blisters and have continued to worsen. Fortunately I do not see any signs of severe infection at this point which is good news. Nonetheless I do believe there is some evidence of infection and cellulitis here. Patient does have a history of chronic venous insufficiency, lymphedema, chronic kidney disease stage III, congestive heart failure, and personal history of traumatic brain injury. 05-26-2022 upon  evaluation today patient actually appears to be doing much better in regard to her leg ulcer. She has been tolerating the dressing changes without complication and overall I am actually extremely pleased with where we stand today. Fortunately there does not appear to be any signs of infection locally nor systemically at this point I do believe the antibiotics have been beneficial. 06-02-2022 upon evaluation today patient appears to be doing much better in regard to her wound. She is actually showing signs of excellent improvement the compression wrapping seems to be doing quite well for her. Fortunately I do not see any signs of infection locally or systemically at this time which is great news. No fevers, chills, nausea, vomiting, or diarrhea. 12/26; the patient only has a very small open area left on the right lateral lower leg. Her edema control is excellent. We have ordered her juxta lites in an [name] ([id number]) [id number] Page 2 of 7 attempt to control the swelling in her legs. She tells [name] that only 1 stocking was sent for the right leg. She cannot get standard over the toe stockings on 06-19-2021 upon evaluation today patient appears to be doing excellent in regard to her wound. She is actually tolerating the dressing changes without complication. Fortunately I do not see any signs of infection locally nor systemically which is great news and overall I do believe that we are headed in the right direction. 07-02-2022 upon evaluation today patient appears to be doing well currently in regard to her wound which is very small but also has some callus and dry buildup noted at this point. Again I think that some of this is related to the dressing material, Prisma which there just really is not quite enough drainage for at this point to completely help in that regard. Overall I am extremely pleased with where we stand however. Electronic  Signature(s) Signed: 07/02/2022 10:12:14  AM By: Lenda Kelp PA-C Entered By: Lenda Kelp on 07/02/2022 10:12:14 -------------------------------------------------------------------------------- Physical Exam Details Patient Name: Date of Service: [name], [name] 07/02/2022 9:30 A M Medical Record Number: [id number] Patient Account Number: [id number] Date of Birth/Sex: Treating RN: Jun 28, 1945 (77 y.o. [name] Primary Care Provider: Einar Crow Other Clinician: Referring Provider: Treating Provider/Extender: Johny Shears in Treatment: 6 Constitutional Well-nourished and well-hydrated in no acute distress. Respiratory normal breathing without difficulty. Psychiatric this patient is able to make decisions and demonstrates good insight into disease process. Alert and Oriented x 3. pleasant and cooperative. Notes Upon inspection patient's wound bed actually showed signs of good granulation epithelization at this point. Fortunately there does not appear to be any signs of active infection at this time. No chart review was necessary and overall the wound is very clean I think switching to Xeroform may be a good idea. Electronic Signature(s) Signed: 07/02/2022 10:12:34 AM By: Lenda Kelp PA-C Entered By: Lenda Kelp on 07/02/2022 10:12:34 -------------------------------------------------------------------------------- Physician Orders Details Patient Name: Date of Service: [name]. 07/02/2022 9:30 A M Medical Record Number: [id number] Patient Account Number: [id number] Date of Birth/Sex: Treating RN: 1945/09/29 (77 y.o. [name] Primary Care Provider: Einar Crow Other Clinician: Referring Provider: Treating Provider/Extender: Johny Shears in Treatment: [address] [name] ([id number]) [id number] Page 3 of 7 Verbal / Phone  Orders: No Diagnosis Coding ICD-10 Coding Code Description I87.331 Chronic venous hypertension (idiopathic) with ulcer and inflammation of right lower extremity I89.0 Lymphedema, not elsewhere classified L97.812 Non-pressure chronic ulcer of other part of right lower leg with fat layer exposed N18.30 Chronic kidney disease, stage 3 unspecified I50.42 Chronic combined systolic (congestive) and diastolic (congestive) heart failure Z87.820 Personal history of traumatic brain injury Follow-up Appointments Return Appointment in 1 week. Bathing/ Shower/ Hygiene May shower with wound dressing protected with water repellent cover or cast protector. No tub bath. Edema Control - Lymphedema / Segmental Compressive Device / Other Optional: One layer of unna paste to top of compression wrap (to act as an anchor). 3 Layer Compression System for Lymphedema. - right lower leg Elevate, Exercise Daily and A void Standing for Long Periods of Time. Elevate legs to the level of the heart and pump ankles as often as possible Elevate leg(s) parallel to the floor when sitting. Wound Treatment Wound #1 - Lower Leg Wound Laterality: Right, Lateral Prim Dressing: Xeroform-HBD 2x2 (in/in) 1 x Per Week/30 Days ary Discharge Instructions: Apply Xeroform-HBD 2x2 (in/in) as directed Secondary Dressing: ABD Pad 5x9 (in/in) 1 x Per Week/30 Days Discharge Instructions: Cover with ABD pad Compression Wrap: 3-LAYER WRAP - Profore Lite LF 3 Multilayer Compression Bandaging System 1 x Per Week/30 Days Discharge Instructions: Apply 3 multi-layer wrap as prescribed. Compression Stockings: Circaid Juxta Lite Compression Wrap Right Leg Compression Amount: 20-30 mmHG Discharge Instructions: Apply Circaid Juxta Lite Compression Wrap as directed Electronic Signature(s) Signed: 07/02/2022 5:43:57 PM By: Lenda Kelp PA-C Signed: 07/02/2022 6:11:46 PM By: Elliot Gurney, BSN, RN, CWS, Kim RN, BSN Entered By: Elliot Gurney, BSN, RN, CWS, Kim on  07/02/2022 10:06:03 -------------------------------------------------------------------------------- Problem List Details Patient Name: Date of Service: [name]. 07/02/2022 9:30 A M Medical Record Number: [id number] Patient Account Number: [id number] Date of Birth/Sex: Treating RN: September 14, 1945 (76 y.o. [name] Primary Care Provider: Einar Crow Other Clinician: Referring Provider: Treating Provider/Extender: Johny Shears in  Treatment: 6 Active Problems [name], [name] ([id number]) [id number] Page 4 of 7 ICD-10 Encounter Code Description Active Date MDM Diagnosis I87.331 Chronic venous hypertension (idiopathic) with ulcer and inflammation of right 05/19/2022 No Yes lower extremity I89.0 Lymphedema, not elsewhere classified 05/19/2022 No Yes L97.812 Non-pressure chronic ulcer of other part of right lower leg with fat layer 05/19/2022 No Yes exposed N18.30 Chronic kidney disease, stage 3 unspecified 05/19/2022 No Yes I50.42 Chronic combined systolic (congestive) and diastolic (congestive) heart failure 05/19/2022 No Yes Z87.820 Personal history of traumatic brain injury 05/19/2022 No Yes Inactive Problems Resolved Problems Electronic Signature(s) Signed: 07/02/2022 9:36:53 AM By: Worthy Keeler PA-C Entered By: Worthy Keeler on 07/02/2022 09:36:53 -------------------------------------------------------------------------------- Progress Note Details Patient Name: Date of Service: Robin [place]IFIELD [place] [name]. 07/02/2022 9:30 A M Medical Record Number: [id number] Patient Account Number: [id number] Date of Birth/Sex: Treating RN: Feb 16, 1946 (77 y.o. [name] Primary Care Provider: Frazier Richards Other Clinician: Referring Provider: Treating Provider/Extender: [name] in Treatment: 6 Subjective Chief Complaint Information obtained from  Patient Right LE Ulcer History of Present Illness (HPI) 05-19-2022 upon evaluation today patient appears to be doing poorly in regard to wounds of the right lateral lower extremity. The good news is these do not appear to be too significant the bad news is they are somewhat painful. This on 05-13-2022 started more as blisters and have continued to worsen. Fortunately I do not see any signs of severe infection at this point which is good news. Nonetheless I do believe there is some evidence of infection and cellulitis here. Patient does have a history of chronic venous insufficiency, lymphedema, chronic kidney disease stage III, congestive heart failure, and personal history of traumatic brain injury. 05-26-2022 upon evaluation today patient actually appears to be doing much better in regard to her leg ulcer. She has been tolerating the dressing changes [name], [name] ([id number]) [id number] Page 5 of 7 without complication and overall I am actually extremely pleased with where we stand today. Fortunately there does not appear to be any signs of infection locally nor systemically at this point I do believe the antibiotics have been beneficial. 06-02-2022 upon evaluation today patient appears to be doing much better in regard to her wound. She is actually showing signs of excellent improvement the compression wrapping seems to be doing quite well for her. Fortunately I do not see any signs of infection locally or systemically at this time which is great news. No fevers, chills, nausea, vomiting, or diarrhea. 12/26; the patient only has a very small open area left on the right lateral lower leg. Her edema control is excellent. We have ordered her juxta lites in an attempt to control the swelling in her legs. She tells [name] that only 1 stocking was sent for the right leg. She cannot get standard over the toe stockings on 06-19-2021 upon evaluation today patient  appears to be doing excellent in regard to her wound. She is actually tolerating the dressing changes without complication. Fortunately I do not see any signs of infection locally nor systemically which is great news and overall I do believe that we are headed in the right direction. 07-02-2022 upon evaluation today patient appears to be doing well currently in regard to her wound which is very small but also has some callus and dry buildup noted at this point. Again I think that some of this is related to the dressing material, Prisma which there just really  is not quite enough drainage for at this point to completely help in that regard. Overall I am extremely pleased with where we stand however. Objective Constitutional Well-nourished and well-hydrated in no acute distress. Vitals Time Taken: 9:36 AM, Height: 66 in, Weight: 237 lbs, BMI: 38.2. Respiratory normal breathing without difficulty. Psychiatric this patient is able to make decisions and demonstrates good insight into disease process. Alert and Oriented x 3. pleasant and cooperative. General Notes: Upon inspection patient's wound bed actually showed signs of good granulation epithelization at this point. Fortunately there does not appear to be any signs of active infection at this time. No chart review was necessary and overall the wound is very clean I think switching to Xeroform may be a good idea. Integumentary (Hair, Skin) Wound #1 status is Open. Original cause of wound was Blister. The date acquired was: 05/13/2022. The wound has been in treatment 6 weeks. The wound is located on the Right,Lateral Lower Leg. The wound measures 0.3cm length x 0.3cm width x 0.1cm depth; 0.071cm^2 area and 0.007cm^3 volume. There is Fat Layer (Subcutaneous Tissue) exposed. There is no tunneling or undermining noted. There is a medium amount of serosanguineous drainage noted. The wound margin is flat and intact. There is no granulation within the  wound bed. There is a large (67-100%) amount of necrotic tissue within the wound bed including Adherent Slough. Assessment Active Problems ICD-10 Chronic venous hypertension (idiopathic) with ulcer and inflammation of right lower extremity Lymphedema, not elsewhere classified Non-pressure chronic ulcer of other part of right lower leg with fat layer exposed Chronic kidney disease, stage 3 unspecified Chronic combined systolic (congestive) and diastolic (congestive) heart failure Personal history of traumatic brain injury Procedures Wound #1 Pre-procedure diagnosis of Wound #1 is a Venous Leg Ulcer located on the Right,Lateral Lower Leg . There was a Three Layer Compression Therapy Procedure with a pre-treatment ABI of 0.9 by Robin Coventry, RN. Post procedure Diagnosis Wound #1: Same as Pre-Procedure Plan Follow-up Appointments: [name], [name] ([id number]) [id number] Page 6 of 7 Return Appointment in 1 week. Bathing/ Shower/ Hygiene: May shower with wound dressing protected with water repellent cover or cast protector. No tub bath. Edema Control - Lymphedema / Segmental Compressive Device / Other: Optional: One layer of unna paste to top of compression wrap (to act as an anchor). 3 Layer Compression System for Lymphedema. - right lower leg Elevate, Exercise Daily and Avoid Standing for Long Periods of Time. Elevate legs to the level of the heart and pump ankles as often as possible Elevate leg(s) parallel to the floor when sitting. WOUND #1: - Lower Leg Wound Laterality: Right, Lateral Prim Dressing: Xeroform-HBD 2x2 (in/in) 1 x Per Week/30 Days ary Discharge Instructions: Apply Xeroform-HBD 2x2 (in/in) as directed Secondary Dressing: ABD Pad 5x9 (in/in) 1 x Per Week/30 Days Discharge Instructions: Cover with ABD pad Com pression Wrap: 3-LAYER WRAP - Profore Lite LF 3 Multilayer Compression Bandaging System 1 x Per Week/30 Days Discharge  Instructions: Apply 3 multi-layer wrap as prescribed. Com pression Stockings: Circaid Juxta Lite Compression Wrap Compression Amount: 20-30 mmHg (right) Discharge Instructions: Apply Circaid Juxta Lite Compression Wrap as directed 1. I would recommend currently that we go ahead and switch to Xeroform from the silver collagen due to the fact that I feel like this is definitely not draining as much as it used to and I think the Xeroform is going to help with allowing this to actually stay moist and continue to heal hopefully more appropriately. 2.  I am also can recommend that the patient should continue with the 3 layer compression wrap which I think is doing a good job keeping edema under good control. We will see patient back for reevaluation in 1 week here in the clinic. If anything worsens or changes patient will contact our office for additional recommendations. Electronic Signature(s) Signed: 07/02/2022 10:13:28 AM By: Worthy Keeler PA-C Entered By: Worthy Keeler on 07/02/2022 10:13:28 -------------------------------------------------------------------------------- SuperBill Details Patient Name: Date of Service: Robin [place]IFI[place] [name] 07/02/2022 Medical Record Number: [id number] Patient Account Number: [id number] Date of Birth/Sex: Treating RN: [birth year] (77 y.o. Charolette Forward, Kim Primary Care Provider: Frazier Richards Other Clinician: Referring Provider: Treating Provider/Extender: [name] in Treatment: 6 Diagnosis Coding ICD-10 Codes Code Description [phone] Chronic venous hypertension (idiopathic) with ulcer and inflammation of right lower extremity I89.0 Lymphedema, not elsewhere classified L97.812 Non-pressure chronic ulcer of other part of right lower leg with fat layer exposed N18.30 Chronic kidney disease, stage 3 unspecified I50.42 Chronic combined systolic (congestive) and diastolic (congestive) heart failure Z87.820 Personal  history of traumatic brain injury Facility Procedures : CPT4 Code: [id number] Description: (Facility Use Only) [facility] RT LEG Modifier: Quantity: 1 Physician Procedures : CPT4 Code Description Modifier CANDRA, WEGNER ([id number]) [id number] Pa [id number] - WC PHYS LEVEL 3 - EST PT 1 ICD-10 Diagnosis Description I87.331 Chronic venous hypertension (idiopathic) with ulcer and  inflammation of right lower extremity I89.0 Lymphedema, not elsewhere classified L97.812 Non-pressure chronic ulcer of other part of right lower leg with fat layer exposed N18.30 Chronic kidney disease, stage 3 unspecified Quantity: ge 7 of 7 Electronic Signature(s) Signed: 07/02/2022 10:27:11 AM By: Gretta Cool, BSN, RN, CWS, Kim RN, BSN Signed: 07/02/2022 5:43:57 PM By: Worthy Keeler PA-C Previous Signature: 07/02/2022 10:15:13 AM Version By: Worthy Keeler PA-C Entered By: Gretta Cool, BSN, RN, CWS, Kim on 07/02/2022 10:27:11

## 2022-07-03 NOTE — Progress Notes (Signed)
DHALIA, ZINGARO ([id number]) [id number] Page 1 of 8 Visit Report for 07/02/2022 Arrival Information Details Patient Name: Date of Service: [name], [name] 07/02/2022 9:30 A M Medical Record Number: [id number] Patient Account Number: [id number] Date of Birth/Sex: Treating RN: Oct 22, 1945 (77 y.o. [name] Primary Care [name]: [name] Other Clinician: Referring [name]: Treating [name]/Extender: [name]: 6 Visit Information History Since Last Visit Added or deleted any medications: No Patient Arrived: Cane Pain Present Now: No Arrival Time: 09:30 Accompanied By: self Transfer Assistance: None Patient Identification Verified: Yes Secondary Verification Process Completed: Yes Patient Requires Transmission-Based Precautions: No Patient Has Alerts: No Electronic Signature(s) Signed: 07/02/2022 6:11:46 PM By: [name], BSN, RN, CWS, Kim RN, BSN Entered By: [name], BSN, RN, CWS, [name] on 07/02/2022 09:36:39 -------------------------------------------------------------------------------- Compression Therapy Details Patient Name: Date of Service: [name] RO GO [name]. 07/02/2022 9:30 A M Medical Record Number: [id number] Patient Account Number: [id number] Date of Birth/Sex: Treating RN: 06-02-1946 (77 y.o. [name] Primary Care [name]: [name] Other Clinician: Referring Amjad Fikes: Treating Fitzhugh Vizcarrondo/Extender: [name]: 6 Compression Therapy Performed for Wound Assessment: Wound #1 Right,Lateral Lower Leg Performed By: Clinician Robin Coventry, RN Compression Type: Three Layer Pre [name] ABI: 0.9 Post Procedure Diagnosis Same as Pre-procedure Electronic Signature(s) Signed: 07/02/2022 6:11:46 PM By: [name], BSN, RN, CWS, Kim RN, BSN Entered By: [name], BSN, RN, CWS, [name] on 07/02/2022 10:05:15 [name] ([id number]) [id number] Page 2 of 8 -------------------------------------------------------------------------------- Encounter Discharge Information Details Patient Name: Date of Service: [name], [name] 07/02/2022 9:30 A M Medical Record Number: [id number] Patient Account Number: [id number] Date of Birth/Sex: Treating RN: 1945-12-05 (77 y.o. [name] Primary Care [name]: [name] Other Clinician: Referring [name]: Treating [name]/Extender: [name]: 6 Encounter Discharge Information Items Post Procedure Vitals Discharge Condition: Stable Temperature (F): 97.6 Ambulatory Status: Cane Pulse (bpm): 94 Discharge Destination: Home Respiratory Rate (breaths/min): 16 Transportation: Private Auto Blood Pressure (mmHg): 143/81 Accompanied By: self Schedule Follow-up Appointment: Yes Clinical Summary of Care: Electronic Signature(s) Signed: 07/02/2022 6:11:46 PM By: [name], BSN, RN, CWS, Kim RN, BSN Entered By: [name], BSN, RN, CWS, [name] on 07/02/2022 10:23:12 -------------------------------------------------------------------------------- Lower Extremity Assessment Details Patient Name: Date of Service: [name] RO GO [name]. 07/02/2022 9:30 A M Medical Record Number: [id number] Patient Account Number: [id number] Date of Birth/Sex: Treating RN: [birth year] (77 y.o. [name] Primary Care Khup Sapia: [name] Other Clinician: Referring [name]: Treating [name]/Extender: [name]: 6 Edema Assessment Assessed: [Left: No] [Right: No] [Left: Edema] [Right: :] Calf Left: Right: Point of Measurement: 32 cm From Medial Instep 34.5 cm Ankle Left: Right: Point of Measurement: 10 cm From Medial Instep 18 cm Vascular Assessment [name] ([id number])  [Right:[id number] Page 3 of 8] Pulses: Dorsalis Pedis Palpable: [Right:Yes] Electronic Signature(s) Signed: 07/02/2022 6:11:46 PM By: [name], BSN, RN, CWS, Kim RN, BSN Entered By: [name], BSN, RN, CWS, [name] on 07/02/2022 09:53:48 -------------------------------------------------------------------------------- Multi Wound Chart Details Patient Name: Date of Service: [name] RO GO [name]. 07/02/2022 9:30 A M Medical Record Number: [id number] Patient Account Number: [id number] Date of Birth/Sex: Treating RN: May 03, 1946 (77 y.o. [name] Primary Care [name]: [name] Other Clinician: Referring [name]: Treating [name]/Extender: [name]: 6 [1:Photos:] [N/A:N/A] Right, Lateral Lower Leg N/A N/A Wound Location: Blister  N/A N/A Wounding Event: Venous Leg Ulcer N/A N/A Primary Etiology: Congestive Heart Failure, N/A N/A Comorbid History: Hypertension 05/13/2022 N/A N/A Date Acquired: 6 N/A N/A [name] of [name]: Open N/A N/A Wound Status: No N/A N/A Wound Recurrence: Yes N/A N/A Clustered Wound: 0.3x0.3x0.1 N/A N/A Measurements L x W x D (cm) 0.071 N/A N/A A (cm) : rea 0.007 N/A N/A Volume (cm) : 99.60% N/A N/A % Reduction in Area: 99.60% N/A N/A % Reduction in Volume: Full Thickness Without Exposed N/A N/A Classification: Support Structures Medium N/A N/A Exudate Amount: Serosanguineous N/A N/A Exudate Type: red, brown N/A N/A Exudate Color: Flat and Intact N/A N/A Wound Margin: None Present (0%) N/A N/A Granulation Amount: Large (67-100%) N/A N/A Necrotic Amount: Fat Layer (Subcutaneous Tissue): Yes N/A N/A Exposed Structures: Fascia: No Tendon: No Muscle: No Joint: No Bone: No None N/A N/A Epithelialization: [name] Notes Electronic Signature(s) [name] ([id number]) [id number] Page 4 of 8 Signed:  07/02/2022 6:11:46 PM By: [name], BSN, RN, CWS, Kim RN, BSN Entered By: [name], BSN, RN, CWS, [name] on 07/02/2022 09:53:58 -------------------------------------------------------------------------------- Multi-Disciplinary Care Plan Details Patient Name: Date of Service: [name], [name] 07/02/2022 9:30 A M Medical Record Number: [id number] Patient Account Number: [id number] Date of Birth/Sex: Treating RN: 1945/09/15 (77 y.o. Charolette Forward, [name] Primary Care Bailee Thall: Frazier Richards Other Clinician: Referring Remee Charley: Treating Aileana Hodder/Extender: Benay Pillow in [name]: 6 Active Inactive Necrotic Tissue Nursing Diagnoses: Knowledge deficit related to management of necrotic/devitalized tissue Goals: Necrotic/devitalized tissue will be minimized in the wound bed Date Initiated: 05/19/2022 Target Resolution Date: 06/19/2022 Goal Status: Active Interventions: Assess patient pain level pre-, during and post procedure and prior to discharge Provide education on necrotic tissue and debridement process [name] Activities: Biologic debridement : 05/19/2022 Excisional debridement : 05/19/2022 Notes: Venous Leg Ulcer Nursing Diagnoses: Knowledge deficit related to disease process and management Potential for venous Insuffiency (use before diagnosis confirmed) Goals: Patient will maintain optimal edema control Date Initiated: 07/02/2022 Target Resolution Date: 07/02/2022 Goal Status: Active Patient/caregiver will verbalize understanding of disease process and disease management Date Initiated: 07/02/2022 Target Resolution Date: 07/02/2022 Goal Status: Active Verify adequate tissue perfusion prior to therapeutic compression application Date Initiated: 07/02/2022 Target Resolution Date: 07/02/2022 Goal Status: Active Interventions: Compression as ordered Provide education on venous insufficiency [name] Activities: Therapeutic compression applied :  07/02/2022 Notes: Wound/Skin Impairment MEAGHANN, CHOO ([id number]) [id number] Page 5 of 8 Nursing Diagnoses: Knowledge deficit related to ulceration/compromised skin integrity Goals: Patient/caregiver will verbalize understanding of skin care regimen Date Initiated: 05/19/2022 Target Resolution Date: 06/19/2022 Goal Status: Active Ulcer/skin breakdown will have a volume reduction of 30% by week 4 Date Initiated: 05/19/2022 Target Resolution Date: 06/19/2022 Goal Status: Active Ulcer/skin breakdown will have a volume reduction of 50% by week 8 Date Initiated: 05/19/2022 Target Resolution Date: 07/20/2022 Goal Status: Active Ulcer/skin breakdown will have a volume reduction of 80% by week 12 Date Initiated: 05/19/2022 Target Resolution Date: 08/18/2022 Goal Status: Active Ulcer/skin breakdown will heal within 14 [name] Date Initiated: 05/19/2022 Target Resolution Date: 09/18/2022 Goal Status: Active Interventions: Assess patient/caregiver ability to obtain necessary supplies Assess patient/caregiver ability to perform ulcer/skin care regimen upon admission and as needed Assess ulceration(s) every visit Notes: Electronic Signature(s) Signed: 07/02/2022 6:11:46 PM By: [name], BSN, RN, CWS, Kim RN, BSN Entered By: [name], BSN, RN, CWS, [name] on 07/02/2022 10:18:54 -------------------------------------------------------------------------------- Pain Assessment Details Patient Name: Date of Service: [name] Donald Cheron Every J. 07/02/2022 9:30 A  M Medical Record Number: [id number] Patient Account Number: [id number] Date of Birth/Sex: Treating RN: 22-Nov-1945 (77 y.o. [name] Primary Care Anagha Loseke: Frazier Richards Other Clinician: Referring Darrien Laakso: Treating Brookelle Pellicane/Extender: Benay Pillow in [name]: 6 Active Problems Location of Pain Severity and Description of Pain Patient Has Paino No Site Locations DHRITI, FALES ([id number]) [id number] Page 6 of 8 Pain Management and Medication Current Pain Management: Notes patient denies pain at this time. Electronic Signature(s) Signed: 07/02/2022 6:11:46 PM By: [name], BSN, RN, CWS, Kim RN, BSN Entered By: [name], BSN, RN, CWS, [name] on 07/02/2022 09:39:45 -------------------------------------------------------------------------------- Patient/Caregiver Education Details Patient Name: Date of Service: [name] 1/18/2024andnbsp9:30 A M Medical Record Number: [id number] Patient Account Number: [id number] Date of Birth/Gender: Treating RN: [birth year] (77 y.o. [name] Primary Care Physician: Frazier Richards Other Clinician: Referring Physician: Treating Physician/Extender: Benay Pillow in [name]: 6 Education Assessment Education Provided To: Patient Education Topics Provided Venous: Handouts: Controlling Swelling with Multilayered Compression Wraps Methods: Demonstration, Explain/Verbal Responses: State content correctly Electronic Signature(s) Signed: 07/02/2022 6:11:46 PM By: [name], BSN, RN, CWS, Kim RN, BSN Entered By: [name], BSN, RN, CWS, [name] on 07/02/2022 10:18:17 Ciro Backer ([id number]) [id number] Page 7 of 8 -------------------------------------------------------------------------------- Wound Assessment Details Patient Name: Date of Service: [name], [name] 07/02/2022 9:30 A M Medical Record Number: [id number] Patient Account Number: [id number] Date of Birth/Sex: Treating RN: November 30, 1945 (77 y.o. Charolette Forward, [name] Primary Care Tansy Lorek: Frazier Richards Other Clinician: Referring Keyuna Cuthrell: Treating Anias Bartol/Extender: Benay Pillow in [name]: 6 Wound Status Wound Number: 1 Primary Etiology: Venous Leg Ulcer Wound Location: Right, Lateral Lower Leg Wound  Status: Open Wounding Event: Blister Comorbid History: Congestive Heart Failure, Hypertension Date Acquired: 05/13/2022 [name] Of [name]: 6 Clustered Wound: Yes Photos Wound Measurements Length: (cm) 0.3 Width: (cm) 0.3 Depth: (cm) 0.1 Area: (cm) 0.071 Volume: (cm) 0.007 % Reduction in Area: 99.6% % Reduction in Volume: 99.6% Epithelialization: None Tunneling: No Undermining: No Wound Description Classification: Full Thickness Without Exposed Support Wound Margin: Flat and Intact Exudate Amount: Medium Exudate Type: Serosanguineous Exudate Color: red, brown Structures Foul Odor After Cleansing: No Slough/Fibrino Yes Wound Bed Granulation Amount: None Present (0%) Exposed Structure Necrotic Amount: Large (67-100%) Fascia Exposed: No Necrotic Quality: Adherent Slough Fat Layer (Subcutaneous Tissue) Exposed: Yes Tendon Exposed: No Muscle Exposed: No Joint Exposed: No Bone Exposed: No [name] Notes Wound #1 (Lower Leg) Wound Laterality: Right, Lateral Cleanser Peri-Wound Care KERRILYNN, DERENZO ([id number]) [id number] Page 8 of 8 Topical Primary Dressing Xeroform-HBD 2x2 (in/in) Discharge Instruction: Apply Xeroform-HBD 2x2 (in/in) as directed Secondary Dressing ABD Pad 5x9 (in/in) Discharge Instruction: Cover with ABD pad Secured With Compression Wrap 3-LAYER WRAP - Profore Lite LF 3 Multilayer Compression Bandaging System Discharge Instruction: Apply 3 multi-layer wrap as prescribed. Compression Stockings Circaid Juxta Lite Compression Wrap Quantity: 1 Right Leg Compression Amount: 20-30 mmHg Discharge Instruction: Apply Circaid Juxta Lite Compression Wrap as directed Add-Ons Electronic Signature(s) Signed: 07/02/2022 6:11:46 PM By: [name], BSN, RN, CWS, Kim RN, BSN Entered By: [name], BSN, RN, CWS, [name] on 07/02/2022 09:52:44 -------------------------------------------------------------------------------- [place]  Details Patient Name: Date of Service: [name]. 07/02/2022 9:30 A M Medical Record Number: [id number] Patient Account Number: [id number] Date of Birth/Sex: Treating RN: 1946/01/17 (77 y.o. [name] Primary Care Odester Nilson: Frazier Richards Other Clinician: Referring Torrey Horseman: Treating Navid Lenzen/Extender: Benay Pillow in [name]: 6 Vital  Signs Time Taken: 09:36 Reference Range: 80 - 120 mg / dl Height (in): 66 Weight (lbs): 237 Body Mass Index (BMI): 38.2 Electronic Signature(s) Signed: 07/02/2022 6:11:46 PM By: [name], BSN, RN, CWS, Kim RN, BSN Entered By: [name], BSN, RN, CWS, [name] on 07/02/2022 25:42:70

## 2022-07-09 ENCOUNTER — Encounter: Payer: Medicare Other | Admitting: Internal Medicine

## 2022-07-09 DIAGNOSIS — L97812 Non-pressure chronic ulcer of other part of right lower leg with fat layer exposed: Secondary | ICD-10-CM | POA: Diagnosis not present

## 2022-07-09 NOTE — Progress Notes (Signed)
[name], [name] ([id number]) [id number] Page 1 of 6 Visit Report for 07/09/2022 HPI Details Patient Name: Date of Service: [name] [name], [name] 07/09/2022 10:30 A M Medical Record Number: [id number] Patient Account Number: [id number] Date of Birth/Sex: Treating RN: [birth year] (77 y.o. [name] Primary Care Provider: Einar Crow Other Clinician: Referring Provider: Treating Provider/Extender: Chauncey Mann, MICHA EL Candis Shine in Treatment: 7 History of Present Illness HPI Description: 05-19-2022 upon evaluation today patient appears to be doing poorly in regard to wounds of the right lateral lower extremity. The good news is these do not appear to be too significant the bad news is they are somewhat painful. This on 05-13-2022 started more as blisters and have continued to worsen. Fortunately I do not see any signs of severe infection at this point which is good news. Nonetheless I do believe there is some evidence of infection and cellulitis here. Patient does have a history of chronic venous insufficiency, lymphedema, chronic kidney disease stage III, congestive heart failure, and personal history of traumatic brain injury. 05-26-2022 upon evaluation today patient actually appears to be doing much better in regard to her leg ulcer. She has been tolerating the dressing changes without complication and overall I am actually extremely pleased with where we stand today. Fortunately there does not appear to be any signs of infection locally nor systemically at this point I do believe the antibiotics have been beneficial. 06-02-2022 upon evaluation today patient appears to be doing much better in regard to her wound. She is actually showing signs of excellent improvement the compression wrapping seems to be doing quite well for her. Fortunately I do not see any signs of infection locally or systemically at this time which  is great news. No fevers, chills, nausea, vomiting, or diarrhea. 12/26; the patient only has a very small open area left on the right lateral lower leg. Her edema control is excellent. We have ordered her juxta lites in an attempt to control the swelling in her legs. She tells [name] that only 1 stocking was sent for the right leg. She cannot get standard over the toe stockings on 06-19-2021 upon evaluation today patient appears to be doing excellent in regard to her wound. She is actually tolerating the dressing changes without complication. Fortunately I do not see any signs of infection locally nor systemically which is great news and overall I do believe that we are headed in the right direction. 07-02-2022 upon evaluation today patient appears to be doing well currently in regard to her wound which is very small but also has some callus and dry buildup noted at this point. Again I think that some of this is related to the dressing material, Prisma which there just really is not quite enough drainage for at this point to completely help in that regard. Overall I am extremely pleased with where we stand however. 1/25; patient has a small wound on the right lateral lower leg. Under illumination surface of the small wound appears to have some debris in it. It measures smaller today however and her edema control is quite decent. Changed from Prisma to Xeroform last week when the Prisma stuck to the wound surface Electronic Signature(s) Signed: 07/09/2022 4:38:56 PM By: Baltazar Najjar MD Entered By: Baltazar Najjar on 07/09/2022 11:12:28 -------------------------------------------------------------------------------- Physical Exam Details Patient Name: Date of Service: [name] Robin Poot J. 07/09/2022 10:30 A M Medical Record Number: [id number] Patient Account Number: [id number] [facility]  [name] ([id number]) [id number] Page 2 of 6 Date of Birth/Sex:  Treating RN: Dec 21, 1945 (77 y.o. [name] Primary Care Provider: Einar Crow Other Clinician: Referring Provider: Treating Provider/Extender: RO BSO N, MICHA EL Candis Shine in Treatment: 7 Constitutional Sitting or standing Blood Pressure is within target range for patient.. Pulse regular and within target range for patient.Marland Kitchen Respirations regular, non-labored and within target range.. Temperature is normal and within the target range for the patient.Marland Kitchen appears in no distress. Notes Wound exam; small open area on the right lateral calf. Better measurements than last week. Under illumination the surface of this small area is about 50% slough covered but the overall circumference is too small to do an easy debridement. There is no evidence of surrounding infection edema is under fairly good control Electronic Signature(s) Signed: 07/09/2022 4:38:56 PM By: Baltazar Najjar MD Entered By: Baltazar Najjar on 07/09/2022 11:13:33 -------------------------------------------------------------------------------- Physician Orders Details Patient Name: Date of Service: [name] [name]. 07/09/2022 10:30 A M Medical Record Number: [id number] Patient Account Number: [id number] Date of Birth/Sex: Treating RN: 1946/04/04 (77 y.o. [name], Kim Primary Care Provider: Einar Crow Other Clinician: Referring Provider: Treating Provider/Extender: Chauncey Mann, MICHA EL Candis Shine in Treatment: 7 Verbal / Phone Orders: No Diagnosis Coding ICD-10 Coding Code Description I87.331 Chronic venous hypertension (idiopathic) with ulcer and inflammation of right lower extremity I89.0 Lymphedema, not elsewhere classified L97.812 Non-pressure chronic ulcer of other part of right lower leg with fat layer exposed N18.30 Chronic kidney disease, stage 3 unspecified I50.42 Chronic combined systolic (congestive) and diastolic (congestive) heart failure Z87.820  Personal history of traumatic brain injury Follow-up Appointments Return Appointment in 1 week. Nurse Visit as needed Bathing/ Shower/ Hygiene May shower with wound dressing protected with water repellent cover or cast protector. No tub bath. Edema Control - Lymphedema / Segmental Compressive Device / Other Optional: One layer of unna paste to top of compression wrap (to act as an anchor). 3 Layer Compression System for Lymphedema. - right lower leg Elevate, Exercise Daily and A void Standing for Long Periods of Time. Elevate legs to the level of the heart and pump ankles as often as possible Elevate leg(s) parallel to the floor when sitting. Wound Treatment Wound #1 - Lower Leg Wound Laterality: Right, Lateral Prim Dressing: Xeroform-HBD 2x2 (in/in) 1 x Per Week/30 Days ary Discharge Instructions: Apply Xeroform-HBD 2x2 (in/in) as directed Secondary Dressing: ABD Pad 5x9 (in/in) 1 x Per Week/30 Days AYUMI, WANGERIN ([id number]) [id number] Page 3 of 6 Discharge Instructions: Cover with ABD pad Compression Wrap: 3-LAYER WRAP - Profore Lite LF 3 Multilayer Compression Bandaging System 1 x Per Week/30 Days Discharge Instructions: Apply 3 multi-layer wrap as prescribed. Compression Stockings: Circaid Juxta Lite Compression Wrap Right Leg Compression Amount: 20-30 mmHG Discharge Instructions: Apply Circaid Juxta Lite Compression Wrap as directed Electronic Signature(s) Signed: 07/09/2022 3:12:14 PM By: Elliot Gurney, BSN, RN, CWS, Kim RN, BSN Signed: 07/09/2022 4:38:56 PM By: Baltazar Najjar MD Entered By: Elliot Gurney, BSN, RN, CWS, Kim on 07/09/2022 11:21:45 -------------------------------------------------------------------------------- Problem List Details Patient Name: Date of Service: [name] [name]. 07/09/2022 10:30 A M Medical Record Number: [id number] Patient Account Number: [id number] Date of Birth/Sex: Treating RN: 12/12/1945 (77 y.o.  [name] Primary Care Provider: Einar Crow Other Clinician: Referring Provider: Treating Provider/Extender: RO BSO Dorris Carnes, MICHA EL Candis Shine in Treatment: 7 Active Problems ICD-10 Encounter Code Description Active Date MDM Diagnosis  N56.213 Chronic venous hypertension (idiopathic) with ulcer and inflammation of right 05/19/2022 No Yes lower extremity I89.0 Lymphedema, not elsewhere classified 05/19/2022 No Yes L97.812 Non-pressure chronic ulcer of other part of right lower leg with fat layer 05/19/2022 No Yes exposed N18.30 Chronic kidney disease, stage 3 unspecified 05/19/2022 No Yes I50.42 Chronic combined systolic (congestive) and diastolic (congestive) heart failure 05/19/2022 No Yes Z87.820 Personal history of traumatic brain injury 05/19/2022 No Yes Inactive Problems Resolved Problems EVERLEY, EVORA ([id number]) [id number] Page 4 of 6 Electronic Signature(s) Signed: 07/09/2022 4:38:56 PM By: Linton Ham MD Entered By: Linton Ham on 07/09/2022 11:10:18 -------------------------------------------------------------------------------- Progress Note Details Patient Name: Date of Service: [name] [name]. 07/09/2022 10:30 A M Medical Record Number: [id number] Patient Account Number: [id number] Date of Birth/Sex: Treating RN: Mar 09, 1946 (77 y.o. [name] Primary Care Provider: Frazier Richards Other Clinician: Referring Provider: Treating Provider/Extender: Eldridge Dace, MICHA EL Donne Anon in Treatment: 7 Subjective History of Present Illness (HPI) 05-19-2022 upon evaluation today patient appears to be doing poorly in regard to wounds of the right lateral lower extremity. The good news is these do not appear to be too significant the bad news is they are somewhat painful. This on 05-13-2022 started more as blisters and have continued to worsen. Fortunately I do not see any  signs of severe infection at this point which is good news. Nonetheless I do believe there is some evidence of infection and cellulitis here. Patient does have a history of chronic venous insufficiency, lymphedema, chronic kidney disease stage III, congestive heart failure, and personal history of traumatic brain injury. 05-26-2022 upon evaluation today patient actually appears to be doing much better in regard to her leg ulcer. She has been tolerating the dressing changes without complication and overall I am actually extremely pleased with where we stand today. Fortunately there does not appear to be any signs of infection locally nor systemically at this point I do believe the antibiotics have been beneficial. 06-02-2022 upon evaluation today patient appears to be doing much better in regard to her wound. She is actually showing signs of excellent improvement the compression wrapping seems to be doing quite well for her. Fortunately I do not see any signs of infection locally or systemically at this time which is great news. No fevers, chills, nausea, vomiting, or diarrhea. 12/26; the patient only has a very small open area left on the right lateral lower leg. Her edema control is excellent. We have ordered her juxta lites in an attempt to control the swelling in her legs. She tells [name] that only 1 stocking was sent for the right leg. She cannot get standard over the toe stockings on 06-19-2021 upon evaluation today patient appears to be doing excellent in regard to her wound. She is actually tolerating the dressing changes without complication. Fortunately I do not see any signs of infection locally nor systemically which is great news and overall I do believe that we are headed in the right direction. 07-02-2022 upon evaluation today patient appears to be doing well currently in regard to her wound which is very small but also has some callus and dry buildup noted at this point. Again I think that  some of this is related to the dressing material, Prisma which there just really is not quite enough drainage for at this point to completely help in that regard. Overall I am extremely pleased with where we stand however. 1/25; patient has a  small wound on the right lateral lower leg. Under illumination surface of the small wound appears to have some debris in it. It measures smaller today however and her edema control is quite decent. Changed from Prisma to Xeroform last week when the Prisma stuck to the wound surface Objective Constitutional Sitting or standing Blood Pressure is within target range for patient.. Pulse regular and within target range for patient.Marland Kitchen Respirations regular, non-labored and within target range.. Temperature is normal and within the target range for the patient.Marland Kitchen appears in no distress. Vitals Time Taken: 10:52 AM, Height: 66 in, Weight: 237 lbs, BMI: 38.2, Temperature: 97.6 F, Pulse: 83 bpm, Respiratory Rate: 18 breaths/min, Blood Pressure: 140/69 mmHg. General Notes: Wound exam; small open area on the right lateral calf. Better measurements than last week. Under illumination the surface of this small area is about 50% slough covered but the overall circumference is too small to do an easy debridement. There is no evidence of surrounding infection edema is under fairly good control Integumentary (Hair, Skin) Wound #1 status is Open. Original cause of wound was Blister. The date acquired was: 05/13/2022. The wound has been in treatment 7 weeks. The wound is NAINIKA, NEWLUN ([id number]) [id number] Page 5 of 6 located on the Right,Lateral Lower Leg. The wound measures 0.2cm length x 0.2cm width x 0.1cm depth; 0.031cm^2 area and 0.003cm^3 volume. There is Fat Layer (Subcutaneous Tissue) exposed. There is no tunneling or undermining noted. There is a medium amount of serosanguineous drainage noted. The wound margin is flat and  intact. There is no granulation within the wound bed. There is a large (67-100%) amount of necrotic tissue within the wound bed. Assessment Active Problems ICD-10 Chronic venous hypertension (idiopathic) with ulcer and inflammation of right lower extremity Lymphedema, not elsewhere classified Non-pressure chronic ulcer of other part of right lower leg with fat layer exposed Chronic kidney disease, stage 3 unspecified Chronic combined systolic (congestive) and diastolic (congestive) heart failure Personal history of traumatic brain injury Plan 1. I continued with the Xeroform under 3 layer compression 2. Had some thoughts about using Iodosorb to clean out the surface of this however the dimensions are measuring smaller therefore I did not change the primary dressing. 3. The overall circumference of the wound is really too small to debride adequately. 4. Our intake nurse talked with the patient about compression stockings, apparently she cannot get over the toe stockings on. She lives with her husband who is not able to assist her Electronic Signature(s) Signed: 07/09/2022 4:38:56 PM By: Linton Ham MD Entered By: Linton Ham on 07/09/2022 11:15:20 -------------------------------------------------------------------------------- SuperBill Details Patient Name: Date of Service: [name] [name] 07/09/2022 Medical Record Number: [id number] Patient Account Number: [id number] Date of Birth/Sex: Treating RN: Aug 10, 1945 (76 y.o. Charolette Forward, Kim Primary Care Provider: Frazier Richards Other Clinician: Referring Provider: Treating Provider/Extender: Eldridge Dace, MICHA EL Donne Anon in Treatment: 7 Diagnosis Coding ICD-10 Codes Code Description [id number] Chronic venous hypertension (idiopathic) with ulcer and inflammation of right lower extremity I89.0 Lymphedema, not elsewhere classified L97.812 Non-pressure chronic ulcer of other part of right lower leg with  fat layer exposed N18.30 Chronic kidney disease, stage 3 unspecified I50.42 Chronic combined systolic (congestive) and diastolic (congestive) heart failure Z87.820 Personal history of traumatic brain injury Facility Procedures : HOLLIF[facility]EL CPT4 CodeMarijean Bravo, LIN [id number] (Fa Description: Dorena Bodo ([id number]) [phone] cility Use Only) [id number] - APPLY MULTLAY COMPRS LWR RT LEG Modifier: [id number] 1 Quantity: df Page  6 of 6 Physician Procedures : CPT4 Code Description Modifier [phone] [id number] - WC PHYS LEVEL 3 - EST PT ICD-10 Diagnosis Description L97.812 Non-pressure chronic ulcer of other part of right lower leg with fat layer exposed I87.331 Chronic venous hypertension (idiopathic) with ulcer  and inflammation of right lower extremity Quantity: 1 Electronic Signature(s) Signed: 07/09/2022 3:12:14 PM By: Elliot Gurney, BSN, RN, CWS, Kim RN, BSN Signed: 07/09/2022 4:38:56 PM By: Baltazar Najjar MD Entered By: Elliot Gurney, BSN, RN, CWS, Kim on 07/09/2022 11:26:33

## 2022-07-09 NOTE — Progress Notes (Signed)
JAJAIRA, RUIS ([id number]) [id number] Page 1 of 8 Visit Report for 07/09/2022 Arrival Information Details Patient Name: Date of Service: [name] TRENACE, COUGHLIN 07/09/2022 10:30 A M Medical Record Number: [id number] Patient Account Number: [id number] Date of Birth/Sex: Treating RN: 1946-06-12 (77 y.o. [name] Primary Care Ashyah Quizon: Frazier Richards Other Clinician: Referring Amberli Ruegg: Treating Terresa Marlett/Extender: RO BSO Delane Ginger, MICHA EL Donne Anon in Treatment: 7 Visit Information History Since Last Visit Added or deleted any medications: No Patient Arrived: Cane Has Dressing in Place as Prescribed: Yes Arrival Time: 10:50 Has Compression in Place as Prescribed: Yes Accompanied By: self Pain Present Now: No Transfer Assistance: None Patient Identification Verified: Yes Secondary Verification Process Completed: Yes Patient Requires Transmission-Based Precautions: No Patient Has Alerts: No Electronic Signature(s) Signed: 07/09/2022 3:12:14 PM By: Gretta Cool, BSN, RN, CWS, Kim RN, BSN Entered By: Gretta Cool, BSN, RN, CWS, Kim on 07/09/2022 10:52:41 -------------------------------------------------------------------------------- Compression Therapy Details Patient Name: Date of Service: [name] [name]. 07/09/2022 10:30 A M Medical Record Number: [id number] Patient Account Number: [id number] Date of Birth/Sex: Treating RN: 1946/05/27 (77 y.o. [name] Primary Care Kataleya Zaugg: Frazier Richards Other Clinician: Referring Lela Murfin: Treating Nevaeha Finerty/Extender: RO BSO Delane Ginger, MICHA EL Donne Anon in Treatment: 7 Compression Therapy Performed for Wound Assessment: Wound #1 Right,Lateral Lower Leg Performed By: Clinician Cornell Barman, RN Compression Type: Three Layer Pre Treatment ABI: 0.9 Post Procedure Diagnosis Same as Pre-procedure Electronic Signature(s) Signed: 07/09/2022 3:12:14 PM By: Gretta Cool,  BSN, RN, CWS, Kim RN, BSN Entered By: Gretta Cool, BSN, RN, CWS, Kim on 07/09/2022 11:26:20 Ciro Backer ([id number]) [id number] Page 2 of 8 -------------------------------------------------------------------------------- Encounter Discharge Information Details Patient Name: Date of Service: [name] AYDIA, MAJ 07/09/2022 10:30 A M Medical Record Number: [id number] Patient Account Number: [id number] Date of Birth/Sex: Treating RN: Nov 27, 1945 (77 y.o. [name] Primary Care Gerasimos Plotts: Frazier Richards Other Clinician: Referring Suleima Ohlendorf: Treating Herbert Aguinaldo/Extender: RO BSO Delane Ginger, MICHA EL Donne Anon in Treatment: 7 Encounter Discharge Information Items Discharge Condition: Stable Ambulatory Status: Enetai Discharge Destination: Home Transportation: Private Auto Schedule Follow-up Appointment: Yes Clinical Summary of Care: Electronic Signature(s) Signed: 07/09/2022 3:12:14 PM By: Gretta Cool, BSN, RN, CWS, Kim RN, BSN Entered By: Gretta Cool, BSN, RN, CWS, Kim on 07/09/2022 11:27:18 -------------------------------------------------------------------------------- Lower Extremity Assessment Details Patient Name: Date of Service: [name] BASSY, FETTERLY 07/09/2022 10:30 A M Medical Record Number: [id number] Patient Account Number: [id number] Date of Birth/Sex: Treating RN: 18-Jan-1946 (77 y.o. [name] Primary Care Khiley Lieser: Frazier Richards Other Clinician: Referring Benjiman Sedgwick: Treating Jshawn Hurta/Extender: RO BSO N, MICHA EL Marlou Starks Weeks in Treatment: 7 Edema Assessment Assessed: [Left: No] [Right: No] [Left: Edema] [Right: :] Calf Left: Right: Point of Measurement: 32 cm From Medial Instep 33.5 cm Ankle Left: Right: Point of Measurement: 10 cm From Medial Instep 18 cm Vascular Assessment Pulses: LILYANAH, CELESTIN ([id number]) [[id number] Page 3 of  8] Dorsalis Pedis Palpable: [Right:Yes] Electronic Signature(s) Signed: 07/09/2022 3:12:14 PM By: Gretta Cool, BSN, RN, CWS, Kim RN, BSN Entered By: Gretta Cool, BSN, RN, CWS, Kim on 07/09/2022 11:21:05 -------------------------------------------------------------------------------- Multi Wound Chart Details Patient Name: Date of Service: [name] [name]. 07/09/2022 10:30 A M Medical Record Number: [id number] Patient Account Number: [id number] Date of Birth/Sex: Treating RN: 1945/08/27 (77 y.o. [name] Primary Care Eisa Necaise: Frazier Richards Other Clinician: Referring Jamiaya Bina: Treating Nataly Pacifico/Extender: RO BSO N, [place] EL Corrie Dandy, Rae Lips  in Treatment: 7 Vital Signs Height(in): 66 Pulse(bpm): 83 Weight(lbs): 237 Blood Pressure(mmHg): 140/69 Body Mass Index(BMI): 38.2 Temperature(F): 97.6 Respiratory Rate(breaths/min): 18 [1:Photos:] [N/A:N/A] Right, Lateral Lower Leg N/A N/A Wound Location: Blister N/A N/A Wounding Event: Venous Leg Ulcer N/A N/A Primary Etiology: Congestive Heart Failure, N/A N/A Comorbid History: Hypertension 05/13/2022 N/A N/A Date Acquired: 7 N/A N/A Weeks of Treatment: Open N/A N/A Wound Status: No N/A N/A Wound Recurrence: Yes N/A N/A Clustered Wound: 0.3x0.3x0.2 N/A N/A Measurements L x W x D (cm) 0.071 N/A N/A A (cm) : rea 0.014 N/A N/A Volume (cm) : 99.60% N/A N/A % Reduction in Area: 99.30% N/A N/A % Reduction in Volume: Full Thickness Without Exposed N/A N/A Classification: Support Structures Medium N/A N/A Exudate Amount: Serosanguineous N/A N/A Exudate Type: red, brown N/A N/A Exudate Color: Flat and Intact N/A N/A Wound Margin: None Present (0%) N/A N/A Granulation Amount: Large (67-100%) N/A N/A Necrotic Amount: Fat Layer (Subcutaneous Tissue): Yes N/A N/A Exposed Structures: Fascia: No Tendon: No Muscle: No Joint: No Bone: No None N/A N/A Epithelialization: NUMA, HEATWOLE ([id number]) [id number] Page 4 of 8 Treatment Notes Electronic Signature(s) Signed: 07/09/2022 3:12:14 PM By: Gretta Cool, BSN, RN, CWS, Kim RN, BSN Entered By: Gretta Cool, BSN, RN, CWS, Kim on 07/09/2022 11:06:53 -------------------------------------------------------------------------------- Multi-Disciplinary Care Plan Details Patient Name: Date of Service: [name] MAYO, OWCZARZAK 07/09/2022 10:30 A M Medical Record Number: [id number] Patient Account Number: [id number] Date of Birth/Sex: Treating RN: 1946/04/02 (77 y.o. [name] Primary Care Xena Propst: Frazier Richards Other Clinician: Referring Kennadee Walthour: Treating Saiquan Hands/Extender: RO BSO N, MICHA EL Donne Anon in Treatment: 7 Active Inactive Necrotic Tissue Nursing Diagnoses: Knowledge deficit related to management of necrotic/devitalized tissue Goals: Necrotic/devitalized tissue will be minimized in the wound bed Date Initiated: 05/19/2022 Target Resolution Date: 07/16/2022 Goal Status: Active Interventions: Assess patient pain level pre-, during and post procedure and prior to discharge Provide education on necrotic tissue and debridement process Treatment Activities: Biologic debridement : 05/19/2022 Excisional debridement : 05/19/2022 Notes: Venous Leg Ulcer Nursing Diagnoses: Knowledge deficit related to disease process and management Potential for venous Insuffiency (use before diagnosis confirmed) Goals: Patient will maintain optimal edema control Date Initiated: 07/02/2022 Target Resolution Date: 07/16/2022 Goal Status: Active Patient/caregiver will verbalize understanding of disease process and disease management Date Initiated: 07/02/2022 Target Resolution Date: 07/09/2022 Goal Status: Active Verify adequate tissue perfusion prior to therapeutic compression application Date Initiated: 07/02/2022 Target Resolution Date: 07/02/2022 Goal Status:  Active Interventions: Compression as ordered Provide education on venous insufficiency SHENICE, DOLDER ([id number]) [id number] Page 5 of 8 Treatment Activities: Therapeutic compression applied : 07/02/2022 Notes: Wound/Skin Impairment Nursing Diagnoses: Knowledge deficit related to ulceration/compromised skin integrity Goals: Patient/caregiver will verbalize understanding of skin care regimen Date Initiated: 05/19/2022 Target Resolution Date: 07/10/2022 Goal Status: Active Ulcer/skin breakdown will have a volume reduction of 30% by week 4 Date Initiated: 05/19/2022 Date Inactivated: 07/01/2022 Target Resolution Date: 06/19/2022 Goal Status: Met Ulcer/skin breakdown will have a volume reduction of 50% by week 8 Date Initiated: 05/19/2022 Target Resolution Date: 07/20/2022 Goal Status: Active Ulcer/skin breakdown will have a volume reduction of 80% by week 12 Date Initiated: 05/19/2022 Target Resolution Date: 08/18/2022 Goal Status: Active Ulcer/skin breakdown will heal within 14 weeks Date Initiated: 05/19/2022 Target Resolution Date: 09/18/2022 Goal Status: Active Interventions: Assess patient/caregiver ability to obtain necessary supplies Assess patient/caregiver ability to perform ulcer/skin care regimen upon admission and as needed Assess ulceration(s) every visit Notes: Electronic Signature(s)  Signed: 07/09/2022 3:12:14 PM By: Gretta Cool, BSN, RN, CWS, Kim RN, BSN Entered By: Gretta Cool, BSN, RN, CWS, Kim on 07/09/2022 11:26:55 -------------------------------------------------------------------------------- Pain Assessment Details Patient Name: Date of Service: [name] NYLIYAH, ALLOY 07/09/2022 10:30 A M Medical Record Number: [id number] Patient Account Number: [id number] Date of Birth/Sex: Treating RN: [birth year] (77 y.o. [name] Primary Care Jobani Sabado: Frazier Richards Other Clinician: Referring Fabyan Loughmiller: Treating Shaletha Humble/Extender:  RO BSO Delane Ginger, MICHA EL Donne Anon in Treatment: 7 Active Problems Location of Pain Severity and Description of Pain Patient Has Paino No Site Locations TINISHA, SWAMINATHAN ([id number]) [id number] Page 6 of 8 Pain Management and Medication Current Pain Management: Notes patient denies pain at this time. Electronic Signature(s) Signed: 07/09/2022 3:12:14 PM By: Gretta Cool, BSN, RN, CWS, Kim RN, BSN Entered By: Gretta Cool, BSN, RN, CWS, Kim on 07/09/2022 10:54:40 -------------------------------------------------------------------------------- Patient/Caregiver Education Details Patient Name: Date of Service: [name] [name] 1/25/2024andnbsp10:30 A M Medical Record Number: [id number] Patient Account Number: [id number] Date of Birth/Gender: Treating RN: 1945-09-22 (77 y.o. [name] Primary Care Physician: Frazier Richards Other Clinician: Referring Physician: Treating Physician/Extender: RO BSO N, Metcalf EL Donne Anon in Treatment: 7 Education Assessment Education Provided To: Patient Education Topics Provided Venous: Handouts: Controlling Swelling with Multilayered Compression Wraps Methods: Demonstration, Explain/Verbal Responses: State content correctly Electronic Signature(s) Signed: 07/09/2022 3:12:14 PM By: Gretta Cool, BSN, RN, CWS, Kim RN, BSN Entered By: Gretta Cool, BSN, RN, CWS, Kim on 07/09/2022 11:22:03 Ciro Backer ([id number]) [id number] Page 7 of 8 -------------------------------------------------------------------------------- Wound Assessment Details Patient Name: Date of Service: [name] ANWAR, MASTEN 07/09/2022 10:30 A M Medical Record Number: [id number] Patient Account Number: [id number] Date of Birth/Sex: Treating RN: 06-08-1946 (77 y.o. Charolette Forward, Kim Primary Care Elie Leppo: Frazier Richards Other Clinician: Referring Douglass Dunshee: Treating  Barnaby Rippeon/Extender: RO BSO N, MICHA EL Donne Anon in Treatment: 7 Wound Status Wound Number: 1 Primary Etiology: Venous Leg Ulcer Wound Location: Right, Lateral Lower Leg Wound Status: Open Wounding Event: Blister Comorbid History: Congestive Heart Failure, Hypertension Date Acquired: 05/13/2022 Weeks Of Treatment: 7 Clustered Wound: Yes Photos Wound Measurements Length: (cm) 0.2 Width: (cm) 0.2 Depth: (cm) 0.1 Area: (cm) 0.031 Volume: (cm) 0.003 % Reduction in Area: 99.8% % Reduction in Volume: 99.8% Epithelialization: None Tunneling: No Undermining: No Wound Description Classification: Full Thickness Without Exposed Support Wound Margin: Flat and Intact Exudate Amount: Medium Exudate Type: Serosanguineous Exudate Color: red, brown Structures Foul Odor After Cleansing: No Slough/Fibrino Yes Wound Bed Granulation Amount: None Present (0%) Exposed Structure Necrotic Amount: Large (67-100%) Fascia Exposed: No Fat Layer (Subcutaneous Tissue) Exposed: Yes Tendon Exposed: No Muscle Exposed: No Joint Exposed: No Bone Exposed: No Treatment Notes Wound #1 (Lower Leg) Wound Laterality: Right, Lateral Cleanser Peri-Wound Care RASHIMA, HOLSTINE ([id number]) [id number] Page 8 of 8 Topical Primary Dressing Xeroform-HBD 2x2 (in/in) Discharge Instruction: Apply Xeroform-HBD 2x2 (in/in) as directed Secondary Dressing ABD Pad 5x9 (in/in) Discharge Instruction: Cover with ABD pad Secured With Compression Wrap 3-LAYER WRAP - Profore Lite LF 3 Multilayer Compression Bandaging System Discharge Instruction: Apply 3 multi-layer wrap as prescribed. Compression Stockings Circaid Juxta Lite Compression Wrap Quantity: 1 Right Leg Compression Amount: 20-30 mmHg Discharge Instruction: Apply Circaid Juxta Lite Compression Wrap as directed Add-Ons Electronic Signature(s) Signed: 07/09/2022 3:12:14 PM By: Gretta Cool, BSN, RN, CWS, Kim  RN, BSN Entered By: Gretta Cool, BSN, RN, CWS, Kim on 07/09/2022 11:07:21 -------------------------------------------------------------------------------- Vitals Details Patient Name: Date of Service: HO  LLIFIELD RO GO KARALINA, TIFT 07/09/2022 10:30 A M Medical Record Number: [id number] Patient Account Number: [id number] Date of Birth/Sex: Treating RN: 03/30/1946 (77 y.o. Cathlean Cower, Kim Primary Care Zanyiah Posten: Einar Crow Other Clinician: Referring Shanon Seawright: Treating Brandon Wiechman/Extender: RO BSO N, MICHA EL Candis Shine in Treatment: 7 Vital Signs Time Taken: 10:52 Temperature (F): 97.6 Height (in): 66 Pulse (bpm): 83 Weight (lbs): 237 Respiratory Rate (breaths/min): 18 Body Mass Index (BMI): 38.2 Blood Pressure (mmHg): 140/69 Reference Range: 80 - 120 mg / dl Electronic Signature(s) Signed: 07/09/2022 3:12:14 PM By: Elliot Gurney, BSN, RN, CWS, Kim RN, BSN Entered By: Elliot Gurney, BSN, RN, CWS, Kim on 07/09/2022 10:54:16

## 2022-07-14 ENCOUNTER — Encounter: Payer: Self-pay | Admitting: Student in an Organized Health Care Education/Training Program

## 2022-07-14 ENCOUNTER — Ambulatory Visit
Payer: Medicare Other | Attending: Student in an Organized Health Care Education/Training Program | Admitting: Student in an Organized Health Care Education/Training Program

## 2022-07-14 VITALS — BP 158/69 | HR 77 | Temp 98.4°F | Ht 65.0 in | Wt 242.0 lb

## 2022-07-14 DIAGNOSIS — G894 Chronic pain syndrome: Secondary | ICD-10-CM

## 2022-07-14 DIAGNOSIS — M47816 Spondylosis without myelopathy or radiculopathy, lumbar region: Secondary | ICD-10-CM

## 2022-07-14 DIAGNOSIS — M5136 Other intervertebral disc degeneration, lumbar region: Secondary | ICD-10-CM | POA: Insufficient documentation

## 2022-07-14 MED ORDER — TRAMADOL HCL 50 MG PO TABS: 50.0000 mg | ORAL_TABLET | Freq: Two times a day (BID) | ORAL | 2 refills | Status: DC | PRN

## 2022-07-14 NOTE — Progress Notes (Signed)
Nursing Pain Medication Assessment:  Safety precautions to be maintained throughout the outpatient stay will include: orient to surroundings, keep bed in low position, maintain call bell within reach at all times, provide assistance with transfer out of bed and ambulation.  Medication Inspection Compliance: Pill count conducted under aseptic conditions, in front of the patient. Neither the pills nor the bottle was removed from the patient's sight at any time. Once count was completed pills were immediately returned to the patient in their original bottle.  Medication: Tramadol (Ultram) Pill/Patch Count:  53 of 90 pills remain Pill/Patch Appearance: Markings consistent with prescribed medication Bottle Appearance: Standard pharmacy container. Clearly labeled. Filled Date: 01 / 08 / 2024 Last Medication intake:  YesterdaySafety precautions to be maintained throughout the outpatient stay will include: orient to surroundings, keep bed in low position, maintain call bell within reach at all times, provide assistance with transfer out of bed and ambulation.

## 2022-07-14 NOTE — Progress Notes (Signed)
PROVIDER NOTE: Information contained herein reflects review and annotations entered in association with encounter. Interpretation of such information and data should be left to medically-trained personnel. Information provided to patient can be located elsewhere in the medical record under "Patient Instructions". Document created using STT-dictation technology, any transcriptional errors that may result from process are unintentional.    Patient: [name]  Service Category: E/M  Provider: Gillis Santa, MD  DOB: March 28, 1946  DOS: 07/14/2022  Specialty: Interventional Pain Management  MRN: [id number]  Setting: Ambulatory outpatient  PCP: Kirk Ruths, MD  Type: Established Patient    Referring Provider: Kirk Ruths, MD  Location: Office  Delivery: Face-to-face     HPI  [name], a 77 y.o. year old female, is here today because of her Lumbar facet arthropathy [M47.816]. [name]'s primary complain today is Back Pain (Lower worse on left)  Last encounter: My last encounter with her was on 04/21/22 Pertinent problems: [name] has Lumbar spondylosis; Lumbar facet arthropathy; Lumbar degenerative disc disease; Spasm of lumbar paraspinous muscle; and Chronic pain syndrome on their pertinent problem list. Pain Assessment: Severity of Chronic pain is reported as a 4 /10. Location: Back Right, Left, Lower/radiates to left hip. Onset: More than a month ago. Quality: Constant. Timing: Constant. Modifying factor(s): staying off of it. Vitals:  height is 5\' 5"  (1.651 m) and weight is 242 lb (109.8 kg). Her temporal temperature is 98.4 F (36.9 C). Her blood pressure is 158/69 (abnormal) and her pulse is 77. Her oxygen saturation is 98%.   Reason for encounter: medication management.  To discuss repeating L-RFA as well  Refill of Tramadol today  Furthermore, she is having increased lower back pain secondary to lumbar  spondylosis and facet arthropathy.  Previous lumbar radiofrequency ablations were done on 02/23/22 for the left  side and 01/28/22 for the right side which provided 75% pain relief and improvement in ADLS until recently.  She would like to repeat these starting with the left side first which we will plan to do in the next couple of weeks.    Pharmacotherapy Assessment   Analgesic: Tramadol 50 to 100 mg twice daily as needed.  Monitoring: [place] PMP: PDMP reviewed during this encounter.       Pharmacotherapy: No side-effects or adverse reactions reported. Compliance: No problems identified. Effectiveness: Clinically acceptable.  Arlice Colt, RN  07/14/2022  9:21 AM  Sign when Signing Visit Nursing Pain Medication Assessment:  Safety precautions to be maintained throughout the outpatient stay will include: orient to surroundings, keep bed in low position, maintain call bell within reach at all times, provide assistance with transfer out of bed and ambulation.  Medication Inspection Compliance: Pill count conducted under aseptic conditions, in front of the patient. Neither the pills nor the bottle was removed from the patient's sight at any time. Once count was completed pills were immediately returned to the patient in their original bottle.  Medication: Tramadol (Ultram) Pill/Patch Count:  53 of 90 pills remain Pill/Patch Appearance: Markings consistent with prescribed medication Bottle Appearance: Standard pharmacy container. Clearly labeled. Filled Date: 01 / 08 / 2024 Last Medication intake:  YesterdaySafety precautions to be maintained throughout the outpatient stay will include: orient to surroundings, keep bed in low position, maintain call bell within reach at all times, provide assistance with transfer out of bed and ambulation.      UDS:  Summary  Date Value Ref Range Status  08/01/2020 Note  Final    Comment:     ==================================================================== [employer] Select 13 (MW) ==================================================================== Test                             Result       Flag       Units  Drug Present and Declared for Prescription Verification   Tapentadol                     5398         EXPECTED   ng/mg creat    Source of tapentadol is a scheduled prescription medication.  ==================================================================== Test                      Result    Flag   Units      Ref Range   Creatinine              158              mg/dL      >=20 ==================================================================== Declared Medications:  The flagging and interpretation on this report are based on the  following declared medications.  Unexpected results may arise from  inaccuracies in the declared medications.   **Note: The testing scope of this panel includes these medications:   Tapentadol (Nucynta)   **Note: The testing scope of this panel does not include the  following reported medications:   Acetaminophen (Tylenol)  Albuterol  Amantadine  Amlodipine (Norvasc)  Aripiprazole (Abilify)  Aspirin  Azelastine (Astelin)  Baclofen (Lioresal)  Biotin  Carvedilol (Coreg)  Celecoxib (Celebrex)  Donepezil (Aricept)  Esomeprazole (Nexium)  Fluticasone (Breo)  Fluticasone (Flonase)  Folic Acid  Guaifenesin (Robitussin)  Levothyroxine (Synthroid)  Losartan (Cozaar)  Magnesium  Melatonin  Montelukast (Singulair)  Multivitamin  Nystatin (Mycostatin)  Potassium (Klor-Con)  Topiramate (Topamax)  Torsemide (Demadex)  Trazodone (Desyrel)  Undefined Miscellaneous Drug  Venlafaxine  Vilanterol (Breo)  Vitamin C  Vitamin D3  Zinc ==================================================================== For clinical consultation, please call [phone]. ====================================================================       ROS  Constitutional: Denies any fever or chills Gastrointestinal: No reported hemesis, hematochezia, vomiting, or acute GI distress Musculoskeletal:  Low back pain related to lumbar facet arthropathy Neurological: No reported episodes of acute onset apraxia, aphasia, dysarthria, agnosia, amnesia, paralysis, loss of coordination, or loss of consciousness  Medication Review  ARIPiprazole, Biotin, OnabotulinumtoxinA, Semaglutide, Venlafaxine HCl, Vitamin D3, acetaminophen, albuterol, amLODipine, amantadine, azelastine, baclofen, carvedilol, celecoxib, donepezil, esomeprazole, fluticasone, fluticasone furoate-vilanterol, folic acid, levothyroxine, losartan, magnesium, melatonin, montelukast, multivitamin, nystatin, potassium chloride SA, topiramate, torsemide, traMADol, traZODone, vitamin C, and zinc gluconate  History Review  Allergy: [name] is allergic to codeine, povidone iodine, hydromorphone, latex, oxycodone-acetaminophen, sulfa antibiotics, bupropion, gabapentin, hydrocodone, morphine and related, and procaine. Drug: [name]  reports no history of drug use. Alcohol:  reports current alcohol use of about 2.0 standard drinks of alcohol per week. Tobacco:  reports that she has never smoked. She has never used smokeless tobacco. Social: [name]  reports that she has never smoked. She has never used smokeless tobacco. She reports current alcohol use of about 2.0 standard drinks of alcohol per week. She reports that she does not use drugs. 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 Surgical:  [name]  has a past surgical history that includes Ankle reconstruction (Left); Brachioplasty; Cataract extraction; Cholecystectomy; Soft Tissue Tumor Resection; Abdominal hysterectomy (07/12/2014); Joint replacement (Bilateral, 07/2014, 03/2015); nasal reconstrucion; Toe Surgery (Right); Shoulder open rotator cuff repair; Colonoscopy with propofol (N/A, 12/07/2016); Reduction mammaplasty (Bilateral); and Breast reduction surgery (Bilateral, 2020). Family: family history includes Breast cancer in her paternal aunt; Lung cancer in her father and mother.  Laboratory Chemistry Profile   Renal Lab Results  Component Value Date   BUN 25 (H) 07/06/2020   CREATININE 0.99 07/06/2020   GFRNONAA 60 (L) 07/06/2020     Hepatic Lab Results  Component Value Date   AST 25 07/02/2020   ALT 21 07/02/2020   ALBUMIN 3.7 07/02/2020   ALKPHOS 80 07/02/2020     Electrolytes Lab Results  Component Value Date   NA 136 07/06/2020   K 4.1 07/06/2020   CL 98 07/06/2020   CALCIUM 9.0 07/06/2020   MG 2.0 07/06/2020   PHOS 3.0 07/06/2020     Bone No results found for: "VD25OH", "VD125OH2TOT", "DU2025KY7", "CW2376EG3", "25OHVITD1", "25OHVITD2", "25OHVITD3", "TESTOFREE", "TESTOSTERONE"   Inflammation (CRP: Acute Phase) (ESR: Chronic Phase) Lab Results  Component Value Date   LATICACIDVEN 1.1 07/02/2020       Note: Above Lab results reviewed.   Physical Exam  General appearance: Well nourished, well developed, and well hydrated. In no apparent acute distress Mental status: Alert, oriented x 3 (person, place, & time)       Respiratory: No evidence of acute respiratory distress Eyes: PERLA Vitals: BP (!) 158/69 (BP Location: Right Arm, Patient Position: Sitting, Cuff Size: Large)   Pulse 77   Temp 98.4 F (36.9 C) (Temporal)   Ht 5\' 5"  (1.651 m)   Wt 242 lb (109.8 kg)   SpO2 98%   BMI 40.27 kg/m  BMI: Estimated body mass index is 40.27 kg/m as calculated from the following:    Height as of this encounter: 5\' 5"  (1.651 m).   Weight as of this encounter: 242 lb (109.8 kg). Ideal: Ideal body weight: 57 kg (125 lb 10.6 oz) Adjusted ideal body weight: 78.1 kg (172 lb 3.2 oz)  Positive low back pain related to lumbar facet arthropathy Pain with facet loading  right hip pain Antalgic gait, presents today in wheelchair  Assessment   Diagnosis  1. Lumbar facet arthropathy   2. Lumbar spondylosis   3. Lumbar degenerative disc disease   4. Chronic pain syndrome        Plan of Care   Ms. Beonca J Hollifield [name] has a current medication list which includes the following long-term medication(s): albuterol, amantadine, amlodipine, azelastine, carvedilol, donepezil, esomeprazole, levothyroxine, losartan, montelukast, potassium chloride sa, torsemide, trazodone, and venlafaxine hcl.   1.  Rx for tramado 50 to 100 mg twice daily as needed. 3.  Repeat lumbar radiofrequency ablation as below starting with the left side first  Requested Prescriptions   Signed Prescriptions Disp Refills   traMADol (ULTRAM) 50 MG tablet 90 tablet 2    Sig: Take 1-2 tablets (50-100 mg total) by mouth every 12 (twelve) hours as needed for moderate pain or severe pain.   Orders:  Orders Placed This Encounter  Procedures   Radiofrequency,Lumbar    Standing Status:   Future    Standing Expiration Date:   10/13/2022    Scheduling Instructions:     Side(s): LEFT  Level(s): L3, L4, L5, Medial Branch Nerve(s)     Sedation: PO Valium     Scheduling Timeframe: As soon as pre-approved    Order Specific Question:   Where will this procedure be performed?    Answer:   [facility]   Radiofrequency,Lumbar    Standing Status:   Future    Standing Expiration Date:   10/13/2022    Scheduling Instructions:     Side(s): RIGHT     Level(s): L3, L4, L5, Medial Branch Nerve(s)     Sedation: PO Valium     Scheduling Timeframe: 2 weeks after left    Order Specific Question:   Where  will this procedure be performed?    Answer:   [facility]   Follow-up plan:   Return in about 3 months (around 10/13/2022) for Medication Management, in person.    Recent Visits Date Type Provider Dept  04/21/22 Office Visit Gillis Santa, MD [facility]  Showing recent visits within past 90 days and meeting all other requirements Today's Visits Date Type Provider Dept  07/14/22 Office Visit Gillis Santa, MD [facility]  Showing today's visits and meeting all other requirements Future Appointments Date Type Provider Dept  10/07/22 Appointment Milinda Pointer, MD [facility]  Showing future appointments within next 90 days and meeting all other requirements  I discussed the assessment and treatment plan with the patient. The patient was provided an opportunity to ask questions and all were answered. The patient agreed with the plan and demonstrated an understanding of the instructions.  Patient advised to call back or seek an in-person evaluation if the symptoms or condition worsens.  Duration of encounter:30 minutes.  Note by: Gillis Santa, MD Date: 07/14/2022; Time: 9:47 AM

## 2022-07-16 ENCOUNTER — Encounter: Payer: Medicare Other | Attending: Physician Assistant | Admitting: Physician Assistant

## 2022-07-16 DIAGNOSIS — I872 Venous insufficiency (chronic) (peripheral): Secondary | ICD-10-CM | POA: Diagnosis not present

## 2022-07-16 DIAGNOSIS — E669 Obesity, unspecified: Secondary | ICD-10-CM | POA: Diagnosis not present

## 2022-07-16 DIAGNOSIS — N183 Chronic kidney disease, stage 3 unspecified: Secondary | ICD-10-CM | POA: Insufficient documentation

## 2022-07-16 DIAGNOSIS — L97812 Non-pressure chronic ulcer of other part of right lower leg with fat layer exposed: Secondary | ICD-10-CM | POA: Insufficient documentation

## 2022-07-16 DIAGNOSIS — I130 Hypertensive heart and chronic kidney disease with heart failure and stage 1 through stage 4 chronic kidney disease, or unspecified chronic kidney disease: Secondary | ICD-10-CM | POA: Insufficient documentation

## 2022-07-16 DIAGNOSIS — Z6838 Body mass index (BMI) 38.0-38.9, adult: Secondary | ICD-10-CM | POA: Diagnosis not present

## 2022-07-16 DIAGNOSIS — I5042 Chronic combined systolic (congestive) and diastolic (congestive) heart failure: Secondary | ICD-10-CM | POA: Diagnosis not present

## 2022-07-16 DIAGNOSIS — I890 Lymphedema, not elsewhere classified: Secondary | ICD-10-CM | POA: Diagnosis not present
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
## 2022-07-16 NOTE — Progress Notes (Addendum)
HADASA, GASNER ([id number]) [id number] Page 1 of 9 Visit Report for 07/16/2022 Arrival Information Details Patient Name: Date of Service: [name], [name] 07/16/2022 11:00 A M Medical Record Number: [id number] Patient Account Number: [id number] Date of Birth/Sex: Treating RN: 12/16/1945 (77 y.o. [name] Primary Care Noemy Hallmon: Frazier Richards Other Clinician: Massie Kluver Referring Donnald Tabar: Treating Lowen Mansouri/Extender: Benay Pillow in Treatment: 8 Visit Information History Since Last Visit All ordered tests and consults were completed: No Patient Arrived: Kasandra Knudsen Added or deleted any medications: No Arrival Time: 11:04 Any new allergies or adverse reactions: No Transfer Assistance: None Had a fall or experienced change in No Patient Identification Verified: Yes activities of daily living that may affect Secondary Verification Process Completed: Yes risk of falls: Patient Requires Transmission-Based Precautions: No Signs or symptoms of abuse/neglect since last visito No Patient Has Alerts: No Hospitalized since last visit: No Implantable device outside of the clinic excluding No cellular tissue based products placed in the center since last visit: Has Dressing in Place as Prescribed: Yes Has Compression in Place as Prescribed: Yes Pain Present Now: No Electronic Signature(s) Signed: 07/21/2022 4:49:02 PM By: Massie Kluver Entered By: Massie Kluver on 07/16/2022 11:09:00 -------------------------------------------------------------------------------- Clinic Level of Care Assessment Details Patient Name: Date of Service: Robin LLIFIELD [place] SHANETHA, BRADHAM 07/16/2022 11:00 A M Medical Record Number: [id number] Patient Account Number: [id number] Date of Birth/Sex: Treating RN: 10/18/1945 (77 y.o. [name] Primary Care Armonie Mettler: Frazier Richards Other Clinician: Massie Kluver Referring  Tatianna Ibbotson: Treating Dae Antonucci/Extender: Benay Pillow in Treatment: 8 Clinic Level of Care Assessment Items TOOL 1 Quantity Score []  - 0 Use when EandM and Procedure is performed on INITIAL visit ASSESSMENTS - Nursing Assessment / Reassessment []  - 0 General Physical Exam (combine w/ comprehensive assessment (listed just below) when performed on new pt. evals) YUETTE, PUTNAM ([id number]) [id number] Page 2 of 9 []  - 0 Comprehensive Assessment (HX, ROS, Risk Assessments, Wounds Hx, etc.) ASSESSMENTS - Wound and Skin Assessment / Reassessment []  - 0 Dermatologic / Skin Assessment (not related to wound area) ASSESSMENTS - Ostomy and/or Continence Assessment and Care []  - 0 Incontinence Assessment and Management []  - 0 Ostomy Care Assessment and Management (repouching, etc.) PROCESS - Coordination of Care []  - 0 Simple Patient / Family Education for ongoing care []  - 0 Complex (extensive) Patient / Family Education for ongoing care []  - 0 Staff obtains Programmer, systems, Records, T Results / Process Orders est []  - 0 Staff telephones HHA, Nursing Homes / Clarify orders / etc []  - 0 Routine Transfer to another Facility (non-emergent condition) []  - 0 Routine Hospital Admission (non-emergent condition) []  - 0 New Admissions / Biomedical engineer / Ordering NPWT Apligraf, etc. , []  - 0 Emergency Hospital Admission (emergent condition) PROCESS - Special Needs []  - 0 Pediatric / Minor Patient Management []  - 0 Isolation Patient Management []  - 0 Hearing / Language / Visual special needs []  - 0 Assessment of Community assistance (transportation, D/C planning, etc.) []  - 0 Additional assistance / Altered mentation []  - 0 Support Surface(s) Assessment (bed, cushion, seat, etc.) INTERVENTIONS - Miscellaneous []  - 0 External ear exam []  - 0 Patient Transfer (multiple staff / Civil Service fast streamer / Similar devices) []  -  0 Simple Staple / Suture removal (25 or less) []  - 0 Complex Staple / Suture removal (26 or more) []  - 0 Hypo/Hyperglycemic Management (do not check if billed separately) []  -  0 Ankle / Brachial Index (ABI) - do not check if billed separately Has the patient been seen at the hospital within the last three years: Yes Total Score: 0 Level Of Care: ____ Electronic Signature(s) Signed: 07/21/2022 4:49:02 PM By: Massie Kluver Entered By: Massie Kluver on 07/16/2022 11:38:43 -------------------------------------------------------------------------------- Compression Therapy Details Patient Name: Date of Service: Robin LLIFIELD RO GO [name]. 07/16/2022 11:00 A M Medical Record Number: [id number] Patient Account Number: [id number] Date of Birth/Sex: Treating RN: 1946-03-03 (77 y.o. [name] Primary Care Krissia Schreier: Frazier Richards Other Clinician: Delainy, Mcelhiney ([id number]) [id number] Page 3 of 9 Referring Gerianne Simonet: Treating Stana Bayon/Extender: Benay Pillow in Treatment: 8 Compression Therapy Performed for Wound Assessment: Wound #1 Right,Lateral Lower Leg Performed By: Lenice Pressman, Angie, Compression Type: Three Layer Pre Treatment ABI: 0.9 Post Procedure Diagnosis Same as Pre-procedure Electronic Signature(s) Signed: 07/21/2022 4:49:02 PM By: Massie Kluver Entered By: Massie Kluver on 07/16/2022 11:29:47 -------------------------------------------------------------------------------- Encounter Discharge Information Details Patient Name: Date of Service: Robin LLIFIELD RO GO [name]. 07/16/2022 11:00 A M Medical Record Number: [id number] Patient Account Number: [id number] Date of Birth/Sex: Treating RN: 1945/07/27 (77 y.o. [name] Primary Care Lio Wehrly: Frazier Richards Other Clinician: Massie Kluver Referring Wanisha Shiroma: Treating Shaylie Eklund/Extender: Benay Pillow in Treatment: 8 Encounter Discharge Information Items Discharge Condition: Stable Ambulatory Status: Cane Discharge Destination: Home Transportation: Private Auto Accompanied By: self Schedule Follow-up Appointment: Yes Clinical Summary of Care: Electronic Signature(s) Signed: 07/21/2022 4:49:02 PM By: Massie Kluver Entered By: Massie Kluver on 07/16/2022 11:53:24 -------------------------------------------------------------------------------- Lower Extremity Assessment Details Patient Name: Date of Service: Robin LLIFIELD RO GO SARIN, COMUNALE 07/16/2022 11:00 A M Medical Record Number: [id number] Patient Account Number: [id number] Date of Birth/Sex: Treating RN: Aug 09, 1945 (77 y.o. [name] Primary Care Flara Storti: Frazier Richards Other Clinician: Massie Kluver Referring Haide Klinker: Treating Markevious Ehmke/Extender: Benay Pillow in Treatment: [address] [place] ([id number]) [id number] Page 4 of 9 Edema Assessment Assessed: [Left: No] [Right: Yes] Edema: [Left: Ye] [Right: s] Calf Left: Right: Point of Measurement: 32 cm From Medial Instep 32.2 cm Ankle Left: Right: Point of Measurement: 10 cm From Medial Instep 19 cm Vascular Assessment Pulses: Dorsalis Pedis Palpable: [Right:Yes] Electronic Signature(s) Signed: 07/17/2022 4:09:44 PM By: Gretta Cool, BSN, RN, CWS, Kim RN, BSN Signed: 07/21/2022 4:49:02 PM By: Massie Kluver Entered By: Massie Kluver on 07/16/2022 11:22:08 -------------------------------------------------------------------------------- Multi Wound Chart Details Patient Name: Date of Service: Robin LLIFIELD RO GO [name]. 07/16/2022 11:00 A M Medical Record Number: [id number] Patient Account Number: [id number] Date of Birth/Sex: Treating RN: 03/22/1946 (77 y.o. [name] Primary Care Kire Ferg: Frazier Richards Other Clinician: Massie Kluver Referring Whyatt Klinger: Treating  Niani Mourer/Extender: Benay Pillow in Treatment: 8 Vital Signs Height(in): 66 Pulse(bpm): 81 Weight(lbs): 845 Blood Pressure(mmHg): 161/81 Body Mass Index(BMI): 38.2 Temperature(F): 98.1 Respiratory Rate(breaths/min): 16 [1:Photos:] [N/A:N/A] Right, Lateral Lower Leg N/A N/A Wound Location: Blister N/A N/A Wounding Event: Venous Leg Ulcer N/A N/A Primary Etiology: Congestive Heart Failure, N/A N/A Comorbid History: Hypertension 05/13/2022 N/A N/A Date Acquired: 8 N/A N/A Weeks of Treatment: Open N/A N/A Wound Status: AHYANA, SKILLIN ([id number]) [id number] Page 5 of 9 No N/A N/A Wound Recurrence: Yes N/A N/A Clustered Wound: 0.2x0.2x0.1 N/A N/A Measurements L x W x D (cm) 0.031 N/A N/A A (cm) : rea 0.003 N/A N/A Volume (cm) : 99.80% N/A N/A % Reduction in Area: 99.80% N/A N/A % Reduction in Volume:  Full Thickness Without Exposed N/A N/A Classification: Support Structures Medium N/A N/A Exudate Amount: Serosanguineous N/A N/A Exudate Type: red, brown N/A N/A Exudate Color: Flat and Intact N/A N/A Wound Margin: None Present (0%) N/A N/A Granulation Amount: Large (67-100%) N/A N/A Necrotic Amount: Fat Layer (Subcutaneous Tissue): Yes N/A N/A Exposed Structures: Fascia: No Tendon: No Muscle: No Joint: No Bone: No None N/A N/A Epithelialization: Treatment Notes Electronic Signature(s) Signed: 07/21/2022 4:49:02 PM By: Betha Loa Entered By: Betha Loa on 07/16/2022 11:22:12 -------------------------------------------------------------------------------- Multi-Disciplinary Care Plan Details Patient Name: Date of Service: Robin [name] 07/16/2022 11:00 A M Medical Record Number: [id number] Patient Account Number: [id number] Date of Birth/Sex: Treating RN: 1946/03/03 (77 y.o. Cathlean Cower, [name] Primary Care Sohaib Vereen: Einar Crow Other Clinician: Betha Loa Referring Mirna Sutcliffe: Treating Ulises Wolfinger/Extender: Johny Shears in Treatment: 8 Active Inactive Necrotic Tissue Nursing Diagnoses: Knowledge deficit related to management of necrotic/devitalized tissue Goals: Necrotic/devitalized tissue will be minimized in the wound bed Date Initiated: 05/19/2022 Target Resolution Date: 07/16/2022 Goal Status: Active Interventions: Assess patient pain level pre-, during and post procedure and prior to discharge Provide education on necrotic tissue and debridement process Treatment Activities: Biologic debridement : 05/19/2022 Excisional debridement : 05/19/2022 Notes: Venous Leg Ulcer ZAKIRAH, WEINGART ([id number]) [id number] Page 6 of 9 Nursing Diagnoses: Knowledge deficit related to disease process and management Potential for venous Insuffiency (use before diagnosis confirmed) Goals: Patient will maintain optimal edema control Date Initiated: 07/02/2022 Target Resolution Date: 07/16/2022 Goal Status: Active Patient/caregiver will verbalize understanding of disease process and disease management Date Initiated: 07/02/2022 Target Resolution Date: 07/09/2022 Goal Status: Active Verify adequate tissue perfusion prior to therapeutic compression application Date Initiated: 07/02/2022 Target Resolution Date: 07/02/2022 Goal Status: Active Interventions: Compression as ordered Provide education on venous insufficiency Treatment Activities: Therapeutic compression applied : 07/02/2022 Notes: Wound/Skin Impairment Nursing Diagnoses: Knowledge deficit related to ulceration/compromised skin integrity Goals: Patient/caregiver will verbalize understanding of skin care regimen Date Initiated: 05/19/2022 Target Resolution Date: 07/10/2022 Goal Status: Active Ulcer/skin breakdown will have a volume reduction of 30% by week 4 Date Initiated: 05/19/2022 Date Inactivated: 07/01/2022 Target  Resolution Date: 06/19/2022 Goal Status: Met Ulcer/skin breakdown will have a volume reduction of 50% by week 8 Date Initiated: 05/19/2022 Target Resolution Date: 07/20/2022 Goal Status: Active Ulcer/skin breakdown will have a volume reduction of 80% by week 12 Date Initiated: 05/19/2022 Target Resolution Date: 08/18/2022 Goal Status: Active Ulcer/skin breakdown will heal within 14 weeks Date Initiated: 05/19/2022 Target Resolution Date: 09/18/2022 Goal Status: Active Interventions: Assess patient/caregiver ability to obtain necessary supplies Assess patient/caregiver ability to perform ulcer/skin care regimen upon admission and as needed Assess ulceration(s) every visit Notes: Electronic Signature(s) Signed: 07/17/2022 4:09:44 PM By: Elliot Gurney, BSN, RN, CWS, Kim RN, BSN Signed: 07/21/2022 4:49:02 PM By: Betha Loa Entered By: Betha Loa on 07/16/2022 11:39:40 -------------------------------------------------------------------------------- Pain Assessment Details Patient Name: Date of Service: Robin [name] 07/16/2022 11:00 A M Medical Record Number: [id number] Patient Account Number: [id number] Date of Birth/Sex: Treating RN: 1946/01/16 (77 y.o. [address] [place], [place] J ([id number]) [id number] Page 7 of 9 Primary Care Sophiamarie Nease: Einar Crow Other Clinician: Betha Loa Referring Yvette Roark: Treating Chamari Cutbirth/Extender: Johny Shears in Treatment: 8 Active Problems Location of Pain Severity and Description of Pain Patient Has Paino No Site Locations Pain Management and Medication Current Pain Management: Electronic Signature(s) Signed: 07/17/2022 4:09:44 PM By: Elliot Gurney, BSN, RN, CWS, Kim RN, BSN Signed: 07/21/2022 4:49:02  PM By: Massie Kluver Entered By: Massie Kluver on 07/16/2022 11:12:40 -------------------------------------------------------------------------------- Patient/Caregiver  Education Details Patient Name: Date of Service: Robin LLIFIELD RO GO ZINSKI, Zilla J. 2/1/2024andnbsp11:00 A M Medical Record Number: [id number] Patient Account Number: [id number] Date of Birth/Gender: Treating RN: Dec 17, 1945 (77 y.o. [name] Primary Care Physician: Frazier Richards Other Clinician: Massie Kluver Referring Physician: Treating Physician/Extender: Benay Pillow in Treatment: 8 Education Assessment Education Provided To: Patient Education Topics Provided Wound/Skin Impairment: Handouts: Other: continue wound care as directed Methods: Explain/Verbal Responses: State content correctly Electronic Signature(s) Signed: 07/21/2022 4:49:02 PM By: Rosetta Posner, Kaylah J By: Massie Kluver Signed: 07/21/2022 4:49:02 PM ([id number]) [id number] Page 8 of 9 Entered By: Massie Kluver on 07/16/2022 11:39:35 -------------------------------------------------------------------------------- Wound Assessment Details Patient Name: Date of Service: Robin LLIFIELD RO GO DAKIYAH, HEINKE 07/16/2022 11:00 A M Medical Record Number: [id number] Patient Account Number: [id number] Date of Birth/Sex: Treating RN: 26-Sep-1945 (77 y.o. [name] Primary Care Cassidee Deats: Frazier Richards Other Clinician: Massie Kluver Referring Quana Chamberlain: Treating Tulio Facundo/Extender: Benay Pillow in Treatment: 8 Wound Status Wound Number: 1 Primary Etiology: Venous Leg Ulcer Wound Location: Right, Lateral Lower Leg Wound Status: Open Wounding Event: Blister Comorbid History: Congestive Heart Failure, Hypertension Date Acquired: 05/13/2022 Weeks Of Treatment: 8 Clustered Wound: Yes Photos Wound Measurements Length: (cm) 0.2 Width: (cm) 0.2 Depth: (cm) 0.1 Area: (cm) 0.031 Volume: (cm) 0.003 % Reduction in Area: 99.8% % Reduction in Volume: 99.8% Epithelialization: None Wound  Description Classification: Full Thickness Without Exposed Support Structures Wound Margin: Flat and Intact Exudate Amount: Medium Exudate Type: Serosanguineous Exudate Color: red, brown Foul Odor After Cleansing: No Slough/Fibrino Yes Wound Bed Granulation Amount: None Present (0%) Exposed Structure Necrotic Amount: Large (67-100%) Fascia Exposed: No Fat Layer (Subcutaneous Tissue) Exposed: Yes Tendon Exposed: No Muscle Exposed: No Joint Exposed: No Bone Exposed: No Treatment Notes Wound #1 (Lower Leg) Wound Laterality: Right, Lateral SORIYA, WORSTER ([id number]) [id number] Page 9 of 9 Cleanser Peri-Wound Care Topical Primary Dressing Xeroform-HBD 2x2 (in/in) Discharge Instruction: Apply Xeroform-HBD 2x2 (in/in) as directed Secondary Dressing ABD Pad 5x9 (in/in) Discharge Instruction: Cover with ABD pad Secured With Compression Wrap 3-LAYER WRAP - Profore Lite LF 3 Multilayer Compression Bandaging System Discharge Instruction: Apply 3 multi-layer wrap as prescribed. Compression Stockings Circaid Juxta Lite Compression Wrap Quantity: 1 Right Leg Compression Amount: 20-30 mmHg Discharge Instruction: Apply Circaid Juxta Lite Compression Wrap as directed Add-Ons Electronic Signature(s) Signed: 07/17/2022 4:09:44 PM By: Gretta Cool, BSN, RN, CWS, Kim RN, BSN Signed: 07/21/2022 4:49:02 PM By: Massie Kluver Entered By: Massie Kluver on 07/16/2022 11:21:08 -------------------------------------------------------------------------------- [place] Details Patient Name: Date of Service: Robin LLIFIELD RO GO [name]. 07/16/2022 11:00 A M Medical Record Number: [id number] Patient Account Number: [id number] Date of Birth/Sex: Treating RN: 25-Jul-1945 (77 y.o. [name] Primary Care Quita Mcgrory: Frazier Richards Other Clinician: Massie Kluver Referring Harlo Jaso: Treating Narely Nobles/Extender: Benay Pillow in Treatment:  8 Vital Signs Time Taken: 11:10 Temperature (F): 98.1 Height (in): 66 Pulse (bpm): 81 Weight (lbs): 237 Respiratory Rate (breaths/min): 16 Body Mass Index (BMI): 38.2 Blood Pressure (mmHg): 161/81 Reference Range: 80 - 120 mg / dl Electronic Signature(s) Signed: 07/21/2022 4:49:02 PM By: Massie Kluver Entered By: Massie Kluver on 07/16/2022 11:12:34

## 2022-07-30 ENCOUNTER — Encounter: Payer: Medicare Other | Admitting: Physician Assistant

## 2022-07-30 DIAGNOSIS — L97812 Non-pressure chronic ulcer of other part of right lower leg with fat layer exposed: Secondary | ICD-10-CM | POA: Diagnosis not present

## 2022-07-31 NOTE — Progress Notes (Signed)
JENIQUE, BERENDSEN ([id number]) [id number] Page 1 of 8 Visit Report for 07/30/2022 Arrival Information Details Patient Name: Date of Service: [name] KYNDLE, MEGGITT 07/30/2022 9:30 A M Medical Record Number: [id number] Patient Account Number: [id number] Date of Birth/Sex: Treating RN: December 27, 1945 (77 y.o. [name] Primary Care Lenda Baratta: Frazier Richards Other Clinician: Referring Haasini Patnaude: Treating Hesper Venturella/Extender: Benay Pillow in Treatment: 10 Visit Information History Since Last Visit Added or deleted any medications: No Patient Arrived: Kasandra Knudsen Any new allergies or adverse reactions: No Arrival Time: 10:06 Hospitalized since last visit: No Accompanied By: self Has Dressing in Place as Prescribed: Yes Transfer Assistance: None Pain Present Now: No Patient Identification Verified: Yes Secondary Verification Process Completed: Yes Patient Requires Transmission-Based Precautions: No Patient Has Alerts: No Electronic Signature(s) Unsigned Entered By: Rosalio Loud on 07/30/2022 10:09:36 -------------------------------------------------------------------------------- Clinic Level of Care Assessment Details Patient Name: Date of Service: [name] CARLIS, GATZKE 07/30/2022 9:30 A M Medical Record Number: [id number] Patient Account Number: [id number] Date of Birth/Sex: Treating RN: Mar 20, 1946 (77 y.o. [name] Primary Care Kelsye Loomer: Frazier Richards Other Clinician: Referring Laurna Shetley: Treating Aaylah Pokorny/Extender: Benay Pillow in Treatment: 10 Clinic Level of Care Assessment Items TOOL 4 Quantity Score X- 1 0 Use when only an EandM is performed on FOLLOW-UP visit ASSESSMENTS - Nursing Assessment / Reassessment X- 1 10 Reassessment of Co-morbidities (includes updates in patient status) X- 1 5 Reassessment of Adherence to Treatment Plan ASSESSMENTS - Wound and  Skin A ssessment / Reassessment X - Simple Wound Assessment / Reassessment - one wound 1 5 LETZY, OKON J ([id number] Page 2 of 8 []$  - 0 Complex Wound Assessment / Reassessment - multiple wounds []$  - 0 Dermatologic / Skin Assessment (not related to wound area) ASSESSMENTS - Focused Assessment []$  - 0 Circumferential Edema Measurements - multi extremities []$  - 0 Nutritional Assessment / Counseling / Intervention []$  - 0 Lower Extremity Assessment (monofilament, tuning fork, pulses) []$  - 0 Peripheral Arterial Disease Assessment (using hand held doppler) ASSESSMENTS - Ostomy and/or Continence Assessment and Care []$  - 0 Incontinence Assessment and Management []$  - 0 Ostomy Care Assessment and Management (repouching, etc.) PROCESS - Coordination of Care X - Simple Patient / Family Education for ongoing care 1 15 []$  - 0 Complex (extensive) Patient / Family Education for ongoing care X- 1 10 Staff obtains Programmer, systems, Records, T Results / Process Orders est []$  - 0 Staff telephones HHA, Nursing Homes / Clarify orders / etc []$  - 0 Routine Transfer to another Facility (non-emergent condition) []$  - 0 Routine Hospital Admission (non-emergent condition) []$  - 0 New Admissions / Biomedical engineer / Ordering NPWT Apligraf, etc. , []$  - 0 Emergency Hospital Admission (emergent condition) X- 1 10 Simple Discharge Coordination []$  - 0 Complex (extensive) Discharge Coordination PROCESS - Special Needs []$  - 0 Pediatric / Minor Patient Management []$  - 0 Isolation Patient Management []$  - 0 Hearing / Language / Visual special needs []$  - 0 Assessment of Community assistance (transportation, D/C planning, etc.) []$  - 0 Additional assistance / Altered mentation []$  - 0 Support Surface(s) Assessment (bed, cushion, seat, etc.) INTERVENTIONS - Wound Cleansing / Measurement []$  - 0 Simple Wound Cleansing - one wound []$  - 0 Complex Wound  Cleansing - multiple wounds X- 1 5 Wound Imaging (photographs - any number of wounds) []$  - 0 Wound Tracing (instead of photographs) []$  - 0 Simple Wound Measurement - one wound []$  - 0  Complex Wound Measurement - multiple wounds INTERVENTIONS - Wound Dressings []$  - 0 Small Wound Dressing one or multiple wounds []$  - 0 Medium Wound Dressing one or multiple wounds []$  - 0 Large Wound Dressing one or multiple wounds []$  - 0 Application of Medications - topical []$  - 0 Application of Medications - injection INTERVENTIONS - Miscellaneous []$  - 0 External ear exam []$  - 0 Specimen Collection (cultures, biopsies, blood, body fluids, etc.) []$  - 0 Specimen(s) / Culture(s) sent or taken to Lab for analysis ERVIE, CLAYBURN ([id number]) [id number] Page 3 of 8 []$  - 0 Patient Transfer (multiple staff / Civil Service fast streamer / Similar devices) []$  - 0 Simple Staple / Suture removal (25 or less) []$  - 0 Complex Staple / Suture removal (26 or more) []$  - 0 Hypo / Hyperglycemic Management (close monitor of Blood Glucose) []$  - 0 Ankle / Brachial Index (ABI) - do not check if billed separately X- 1 5 Vital Signs Has the patient been seen at the hospital within the last three years: Yes Total Score: 65 Level Of Care: New/Established - Level 2 Electronic Signature(s) Unsigned Entered By: Rosalio Loud on 07/30/2022 10:32:41 -------------------------------------------------------------------------------- Encounter Discharge Information Details Patient Name: Date of Service: [name] MAURINA, VILLAMAR 07/30/2022 9:30 A M Medical Record Number: [id number] Patient Account Number: [id number] Date of Birth/Sex: Treating RN: Aug 17, 1945 (77 y.o. [name] Primary Care Shaguana Love: Frazier Richards Other Clinician: Referring Alois Mincer: Treating Clarkson Rosselli/Extender: Benay Pillow in Treatment: 10 Encounter Discharge Information Items Discharge  Condition: Stable Ambulatory Status: Cane Discharge Destination: Home Transportation: Private Auto Accompanied By: self Schedule Follow-up Appointment: No Clinical Summary of Care: Electronic Signature(s) Unsigned Entered By: Rosalio Loud on 07/30/2022 10:34:02 Lower Extremity Assessment Details -------------------------------------------------------------------------------- Ciro Backer ([id number] Page 4 of 8 Patient Name: Date of Service: [name] KEYARI, LOGIUDICE 07/30/2022 9:30 A M Medical Record Number: [id number] Patient Account Number: [id number] Date of Birth/Sex: Treating RN: Dec 25, 1945 (77 y.o. [name] Primary Care Carrieann Spielberg: Frazier Richards Other Clinician: Referring Brittini Brubeck: Treating Harriet Sutphen/Extender: Benay Pillow in Treatment: 10 Edema Assessment Left: Right: Assessed: No No Edema: Calf Left: Right: Point of Measurement: 32 cm From Medial Instep 33.5 cm Ankle Left: Right: Point of Measurement: 10 cm From Medial Instep 20 cm Vascular Assessment Left: Right: Pulses: Dorsalis Pedis Palpable: Yes Electronic Signature(s) Unsigned Entered By: Rosalio Loud on 07/30/2022 10:30:00 -------------------------------------------------------------------------------- Multi Wound Chart Details Patient Name: Date of Service: [name] VETRA, VANLEEUWEN 07/30/2022 9:30 A M Medical Record Number: [id number] Patient Account Number: [id number] Date of Birth/Sex: Treating RN: 12-15-1945 (77 y.o. [name] Primary Care Rhesa Forsberg: Frazier Richards Other Clinician: Referring Nekoda Chock: Treating Emitt Maglione/Extender: Benay Pillow in Treatment: 10 Vital Signs Height(in): 66 Pulse(bpm): 72 Weight(lbs): 237 Blood Pressure(mmHg): 159/74 Body Mass Index(BMI): 38.2 Temperature(F): 97.7 Respiratory Rate(breaths/min): 16 [1:Photos:]  [N/A:N/A] KARIA, ISEMAN ([id number]) [1:Right, Lateral Lower Leg Wound Location: Blister Wounding Event: Venous Leg Ulcer Primary Etiology: Congestive Heart Failure, Comorbid History: Hypertension 05/13/2022 Date Acquired: 10 Weeks of Treatment: Healed - Epithelialized Wound Status: No  Wound Recurrence: Yes Clustered Wound: 0x0x0 Measurements L x W x D (cm) 0 A (cm) : rea 0 Volume (cm) : 100.00% % Reduction in Area: 100.00% % Reduction in Volume: Full Thickness Without Exposed Classification: Support Structures Medium Exudate Amount:  Serosanguineous Exudate Type: red, brown Exudate Color: Flat and Intact Wound Margin: None Present (0%) Granulation Amount:  Large (67-100%) Necrotic Amount: Fat Layer (Subcutaneous Tissue): Yes N/A Exposed Structures: Fascia: No Tendon: No Muscle: No  Joint: No Bone: No None Epithelialization:] [N/A:N/A N/A N/A N/A N/A N/A N/A N/A N/A N/A N/A N/A N/A N/A N/A N/A N/A N/A N/A N/A N/A N/A] Treatment Notes Electronic Signature(s) Unsigned Entered By: Rosalio Loud on 07/30/2022 10:31:16 -------------------------------------------------------------------------------- Multi-Disciplinary Care Plan Details Patient Name: Date of Service: [name] LORIE, POSSEHL 07/30/2022 9:30 A M Medical Record Number: [id number] Patient Account Number: [id number] Date of Birth/Sex: Treating RN: 07-31-1945 (77 y.o. [name] Primary Care Cailah Reach: Frazier Richards Other Clinician: Referring Jaksen Fiorella: Treating Sanskriti Greenlaw/Extender: Benay Pillow in Treatment: 10 Active Inactive Electronic Signature(s) Unsigned Entered By: Rosalio Loud on 07/30/2022 10:33:28 Signature(s): BRINDLEY, DALTON ([id number]) [id number] Date(s): [id number] Page 6 of 8 -------------------------------------------------------------------------------- Pain Assessment Details Patient Name: Date of Service: [name]  BLANNIE, ARMENTO 07/30/2022 9:30 A M Medical Record Number: [id number] Patient Account Number: [id number] Date of Birth/Sex: Treating RN: 07/27/1945 (77 y.o. [name] Primary Care Keyaan Lederman: Frazier Richards Other Clinician: Referring Milik Gilreath: Treating Priscila Bean/Extender: Benay Pillow in Treatment: 10 Active Problems Location of Pain Severity and Description of Pain Patient Has Paino No Site Locations Pain Management and Medication Current Pain Management: Electronic Signature(s) Unsigned Entered By: Rosalio Loud on 07/30/2022 10:14:25 -------------------------------------------------------------------------------- Patient/Caregiver Education Details Patient Name: Date of Service: [name] KURTISHA, CHUBBUCK 2/15/2024andnbsp9:30 A M Medical Record Number: [id number] Patient Account Number: [id number] Date of Birth/Gender: Treating RN: Jan 07, 1946 (77 y.o. [name] Primary Care Physician: Frazier Richards Other Clinician: Referring Physician: Treating Physician/Extender: Benay Pillow in Treatment: [address] Tenna Child ([id number]) [id number] Page 7 of 8 Education Assessment Education Provided To: Patient Education Topics Provided Engineer, maintenance) Unsigned Entered By: Rosalio Loud on 07/30/2022 10:32:58 -------------------------------------------------------------------------------- Wound Assessment Details Patient Name: Date of Service: [name] BRAVE, MUNI 07/30/2022 9:30 A M Medical Record Number: [id number] Patient Account Number: [id number] Date of Birth/Sex: Treating RN: 26-Oct-1945 (77 y.o. [name] Primary Care Audreanna Torrisi: Frazier Richards Other Clinician: Referring Charee Tumblin: Treating Kennard Fildes/Extender: Benay Pillow in Treatment: 10 Wound Status Wound Number: 1 Primary Etiology: Venous Leg Ulcer Wound Location:  Right, Lateral Lower Leg Wound Status: Healed - Epithelialized Wounding Event: Blister Comorbid History: Congestive Heart Failure, Hypertension Date Acquired: 05/13/2022 Weeks Of Treatment: 10 Clustered Wound: Yes Photos Wound Measurements Length: (cm) Width: (cm) Depth: (cm) Area: (cm) Volume: (cm) 0 % Reduction in Area: 100% 0 % Reduction in Volume: 100% 0 Epithelialization: None 0 0 Wound Description Classification: Full Thickness Without Exposed Support Structures Wound Margin: Flat and Intact Exudate Amount: Medium Exudate Type: Serosanguineous Exudate Color: red, brown LALANIA, TOLAR ([id number]) Foul Odor After Cleansing: No Slough/Fibrino Yes [phone].pdf Page 8 of 8 Wound Bed Granulation Amount: None Present (0%) Exposed Structure Necrotic Amount: Large (67-100%) Fascia Exposed: No Fat Layer (Subcutaneous Tissue) Exposed: Yes Tendon Exposed: No Muscle Exposed: No Joint Exposed: No Bone Exposed: No Treatment Notes Wound #1 (Lower Leg) Wound Laterality: Right, Lateral Cleanser Peri-Wound Care Topical Primary Dressing Secondary Dressing Secured With Compression Wrap Compression Stockings Add-Ons Electronic Signature(s) Unsigned Entered By: Rosalio Loud on 07/30/2022 10:29:55 -------------------------------------------------------------------------------- Vitals Details Patient Name: Date of Service: [name] OMER, MABIE 07/30/2022 9:30 A M Medical Record Number: [id number] Patient Account Number: [id number] Date of Birth/Sex: Treating RN: [birth year] (77 y.o. [name] Primary Care  Ferlin Fairhurst: Frazier Richards Other Clinician: Referring Monquie Fulgham: Treating Avonlea Sima/Extender: Benay Pillow in Treatment: 10 Vital Signs Time Taken: 10:09 Temperature (F): 97.7 Height (in): 66 Pulse (bpm): 72 Weight (lbs): 237 Respiratory Rate (breaths/min): 16 Body Mass Index  (BMI): 38.2 Blood Pressure (mmHg): 159/74 Reference Range: 80 - 120 mg / dl Electronic Signature(s) Unsigned Entered By: Rosalio Loud on 07/30/2022 10:14:19 Signature(s): Date(s):

## 2022-07-31 NOTE — Progress Notes (Signed)
KIFFANY, RINGHAM ([id number]) [id number] Page 1 of 6 Visit Report for 07/30/2022 Chief Complaint Document Details Patient Name: Date of Service: [name], [name] 07/30/2022 9:30 A M Medical Record Number: [id number] Patient Account Number: [id number] Date of Birth/Sex: Treating RN: 05-13-1946 (77 y.o. [name] Primary Care Provider: Frazier Richards Other Clinician: Referring Provider: Treating Provider/Extender: Benay Pillow in Treatment: 10 Information Obtained from: Patient Chief Complaint Right LE Ulcer Electronic Signature(s) Signed: 07/30/2022 10:00:12 AM By: Worthy Keeler PA-C Entered By: Worthy Keeler on 07/30/2022 10:00:12 -------------------------------------------------------------------------------- HPI Details Patient Name: Date of Service: Robin LLIFIELD RO GO [name]. 07/30/2022 9:30 A M Medical Record Number: [id number] Patient Account Number: [id number] Date of Birth/Sex: Treating RN: 05-22-1946 (77 y.o. [name] Primary Care Provider: Frazier Richards Other Clinician: Referring Provider: Treating Provider/Extender: Benay Pillow in Treatment: 10 History of Present Illness HPI Description: 05-19-2022 upon evaluation today patient appears to be doing poorly in regard to wounds of the right lateral lower extremity. The good news is these do not appear to be too significant the bad news is they are somewhat painful. This on 05-13-2022 started more as blisters and have continued to worsen. Fortunately I do not see any signs of severe infection at this point which is good news. Nonetheless I do believe there is some evidence of infection and cellulitis here. Patient does have a history of chronic venous insufficiency, lymphedema, chronic kidney disease stage III, congestive heart failure, and personal history of traumatic brain injury. 05-26-2022  upon evaluation today patient actually appears to be doing much better in regard to her leg ulcer. She has been tolerating the dressing changes without complication and overall I am actually extremely pleased with where we stand today. Fortunately there does not appear to be any signs of infection locally nor systemically at this point I do believe the antibiotics have been beneficial. 06-02-2022 upon evaluation today patient appears to be doing much better in regard to her wound. She is actually showing signs of excellent improvement the compression wrapping seems to be doing quite well for her. Fortunately I do not see any signs of infection locally or systemically at this time which is great news. No fevers, chills, nausea, vomiting, or diarrhea. 12/26; the patient only has a very small open area left on the right lateral lower leg. Her edema control is excellent. We have ordered her juxta lites in an DORRIS, [name] ([id number]) [id number] Page 2 of 6 attempt to control the swelling in her legs. She tells [name] that only 1 stocking was sent for the right leg. She cannot get standard over the toe stockings on 06-19-2021 upon evaluation today patient appears to be doing excellent in regard to her wound. She is actually tolerating the dressing changes without complication. Fortunately I do not see any signs of infection locally nor systemically which is great news and overall I do believe that we are headed in the right direction. 07-02-2022 upon evaluation today patient appears to be doing well currently in regard to her wound which is very small but also has some callus and dry buildup noted at this point. Again I think that some of this is related to the dressing material, Prisma which there just really is not quite enough drainage for at this point to completely help in that regard. Overall I am extremely pleased with where we stand however. 1/25; patient has  a small  wound on the right lateral lower leg. Under illumination surface of the small wound appears to have some debris in it. It measures smaller today however and her edema control is quite decent. Changed from Prisma to Xeroform last week when the Prisma stuck to the wound surface 07-16-2022 upon evaluation today patient appears to be doing well with regard to her wound. This in fact is almost completely closed there is a lot of new skin and barely anything remaining open at this point. Fortunately I do not see any signs of infection locally nor systemically which is great news. No fevers, chills, nausea, vomiting, or diarrhea. 07-30-2022 upon evaluation today patient appears to be doing excellent in regard to her leg which is actually showing signs of complete improvement this is great news. Overall I think she is healed and ready for discharge good news as she does have her juxta lite compression wrap with her today. Electronic Signature(s) Signed: 07/30/2022 10:52:23 AM By: Worthy Keeler PA-C Entered By: Worthy Keeler on 07/30/2022 10:52:23 -------------------------------------------------------------------------------- Physical Exam Details Patient Name: Date of Service: Robin LLIFIELD RO GO RAGINA, GORDILLO 07/30/2022 9:30 A M Medical Record Number: [id number] Patient Account Number: [id number] Date of Birth/Sex: Treating RN: Jul 25, 1945 (77 y.o. [name] Primary Care Provider: Frazier Richards Other Clinician: Referring Provider: Treating Provider/Extender: Benay Pillow in Treatment: 43 Constitutional Well-nourished and well-hydrated in no acute distress. Respiratory normal breathing without difficulty. Psychiatric this patient is able to make decisions and demonstrates good insight into disease process. Alert and Oriented x 3. pleasant and cooperative. Notes Upon inspection patient's wound bed actually showed signs of good granulation epithelization at  this point. Overall I am very pleased with where we stand I do think that the patient is making excellent progress here. Electronic Signature(s) Signed: 07/30/2022 10:52:39 AM By: Worthy Keeler PA-C Entered By: Worthy Keeler on 07/30/2022 10:52:39 Ciro Backer ([id number] Page 3 of 6 -------------------------------------------------------------------------------- Physician Orders Details Patient Name: Date of Service: Robin LLIFIELD RO GO CHARMIKA, MERVINE 07/30/2022 9:30 A M Medical Record Number: [id number] Patient Account Number: [id number] Date of Birth/Sex: Treating RN: 04-Jun-1946 (77 y.o. [name] Primary Care Provider: Frazier Richards Other Clinician: Referring Provider: Treating Provider/Extender: Benay Pillow in Treatment: 10 Verbal / Phone Orders: No Diagnosis Coding ICD-10 Coding Code Description I87.331 Chronic venous hypertension (idiopathic) with ulcer and inflammation of right lower extremity I89.0 Lymphedema, not elsewhere classified L97.812 Non-pressure chronic ulcer of other part of right lower leg with fat layer exposed N18.30 Chronic kidney disease, stage 3 unspecified I50.42 Chronic combined systolic (congestive) and diastolic (congestive) heart failure Z87.820 Personal history of traumatic brain injury Discharge From [facility] Discharge from [place] [facility] Complete - continue circaid wraps Call if needed. Electronic Signature(s) Signed: 07/30/2022 5:39:55 PM By: Worthy Keeler PA-C Signed: 07/31/2022 1:32:16 PM By: Rosalio Loud MSN RN CNS WTA Entered By: Rosalio Loud on 07/30/2022 10:32:02 -------------------------------------------------------------------------------- Problem List Details Patient Name: Date of Service: Robin LLIFIELD RO GO [name]. 07/30/2022 9:30 A M Medical Record Number: [id number] Patient Account Number: [id number] Date of  Birth/Sex: Treating RN: [birth year] (77 y.o. [name] Primary Care Provider: Frazier Richards Other Clinician: Referring Provider: Treating Provider/Extender: Benay Pillow in Treatment: 10 Active Problems ICD-10 Encounter Code Description Active Date MDM Diagnosis I87.331 Chronic venous hypertension (idiopathic) with ulcer and inflammation of right 05/19/2022 No Yes lower extremity I89.0 Lymphedema, not elsewhere  classified 05/19/2022 No Yes L97.812 Non-pressure chronic ulcer of other part of right lower leg with fat layer 05/19/2022 No Yes exposed N18.30 Chronic kidney disease, stage 3 unspecified 05/19/2022 No Yes SANTA, LURA ([id number]) [id number].pdf Page 4 of 6 I50.42 Chronic combined systolic (congestive) and diastolic (congestive) heart failure 05/19/2022 No Yes Z87.820 Personal history of traumatic brain injury 05/19/2022 No Yes Inactive Problems Resolved Problems Electronic Signature(s) Signed: 07/30/2022 10:00:04 AM By: Worthy Keeler PA-C Entered By: Worthy Keeler on 07/30/2022 10:00:04 -------------------------------------------------------------------------------- Progress Note Details Patient Name: Date of Service: Robin LLIFIELD RO GO [name]. 07/30/2022 9:30 A M Medical Record Number: [id number] Patient Account Number: [id number] Date of Birth/Sex: Treating RN: 25-Jun-1945 (77 y.o. [name] Primary Care Provider: Frazier Richards Other Clinician: Referring Provider: Treating Provider/Extender: Benay Pillow in Treatment: 10 Subjective Chief Complaint Information obtained from Patient Right LE Ulcer History of Present Illness (HPI) 05-19-2022 upon evaluation today patient appears to be doing poorly in regard to wounds of the right lateral lower extremity. The good news is these do not appear to be too significant the bad news is they are somewhat painful.  This on 05-13-2022 started more as blisters and have continued to worsen. Fortunately I do not see any signs of severe infection at this point which is good news. Nonetheless I do believe there is some evidence of infection and cellulitis here. Patient does have a history of chronic venous insufficiency, lymphedema, chronic kidney disease stage III, congestive heart failure, and personal history of traumatic brain injury. 05-26-2022 upon evaluation today patient actually appears to be doing much better in regard to her leg ulcer. She has been tolerating the dressing changes without complication and overall I am actually extremely pleased with where we stand today. Fortunately there does not appear to be any signs of infection locally nor systemically at this point I do believe the antibiotics have been beneficial. 06-02-2022 upon evaluation today patient appears to be doing much better in regard to her wound. She is actually showing signs of excellent improvement the compression wrapping seems to be doing quite well for her. Fortunately I do not see any signs of infection locally or systemically at this time which is great news. No fevers, chills, nausea, vomiting, or diarrhea. 12/26; the patient only has a very small open area left on the right lateral lower leg. Her edema control is excellent. We have ordered her juxta lites in an attempt to control the swelling in her legs. She tells [name] that only 1 stocking was sent for the right leg. She cannot get standard over the toe stockings on 06-19-2021 upon evaluation today patient appears to be doing excellent in regard to her wound. She is actually tolerating the dressing changes without complication. Fortunately I do not see any signs of infection locally nor systemically which is great news and overall I do believe that we are headed in the right direction. 07-02-2022 upon evaluation today patient appears to be doing well currently in regard to her wound  which is very small but also has some callus and dry buildup noted at this point. Again I think that some of this is related to the dressing material, Prisma which there just really is not quite enough drainage for at this point to completely help in that regard. Overall I am extremely pleased with where we stand however. 1/25; patient has a small wound on the right lateral lower leg. Under illumination surface of the  small wound appears to have some debris in it. It measures smaller today however and her edema control is quite decent. Changed from Prisma to Xeroform last week when the Prisma stuck to the wound surface 07-16-2022 upon evaluation today patient appears to be doing well with regard to her wound. This in fact is almost completely closed there is a lot of new skin and [name], [name] ([id number]) [id number] Page 5 of 6 barely anything remaining open at this point. Fortunately I do not see any signs of infection locally nor systemically which is great news. No fevers, chills, nausea, vomiting, or diarrhea. 07-30-2022 upon evaluation today patient appears to be doing excellent in regard to her leg which is actually showing signs of complete improvement this is great news. Overall I think she is healed and ready for discharge good news as she does have her juxta lite compression wrap with her today. Objective Constitutional Well-nourished and well-hydrated in no acute distress. Vitals Time Taken: 10:09 AM, Height: 66 in, Weight: 237 lbs, BMI: 38.2, Temperature: 97.7 F, Pulse: 72 bpm, Respiratory Rate: 16 breaths/min, Blood Pressure: 159/74 mmHg. Respiratory normal breathing without difficulty. Psychiatric this patient is able to make decisions and demonstrates good insight into disease process. Alert and Oriented x 3. pleasant and cooperative. General Notes: Upon inspection patient's wound bed actually showed signs of good granulation  epithelization at this point. Overall I am very pleased with where we stand I do think that the patient is making excellent progress here. Integumentary (Hair, Skin) Wound #1 status is Healed - Epithelialized. Original cause of wound was Blister. The date acquired was: 05/13/2022. The wound has been in treatment 10 weeks. The wound is located on the Right,Lateral Lower Leg. The wound measures 0cm length x 0cm width x 0cm depth; 0cm^2 area and 0cm^3 volume. There is Fat Layer (Subcutaneous Tissue) exposed. There is a medium amount of serosanguineous drainage noted. The wound margin is flat and intact. There is no granulation within the wound bed. There is a large (67-100%) amount of necrotic tissue within the wound bed. Assessment Active Problems ICD-10 Chronic venous hypertension (idiopathic) with ulcer and inflammation of right lower extremity Lymphedema, not elsewhere classified Non-pressure chronic ulcer of other part of right lower leg with fat layer exposed Chronic kidney disease, stage 3 unspecified Chronic combined systolic (congestive) and diastolic (congestive) heart failure Personal history of traumatic brain injury Plan Discharge From [facility]: Discharge from [facility] [facility] Complete - continue circaid wraps Call if needed. 1. I would recommend the patient use the juxta lite compression wrap which I think is can be the best option going forward to keep her edema under control. 2. I am also going to suggest the patient should continue to monitor for any signs of infection or worsening in general if anything changes she knows to let me know as soon as possible. Will see her back for follow-up visit as needed. Electronic Signature(s) Signed: 07/30/2022 10:53:13 AM By: Worthy Keeler PA-C Entered By: Worthy Keeler on 07/30/2022 10:53:13 Ciro Backer ([id number] Page 6 of  6 -------------------------------------------------------------------------------- SuperBill Details Patient Name: Date of Service: Robin LLIFIELD RO GO [name], [name] 07/30/2022 Medical Record Number: [id number] Patient Account Number: [id number] Date of Birth/Sex: Treating RN: [birth year] (77 y.o. 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-pressure chronic ulcer of other part of right lower leg with fat layer exposed N18.30 Chronic kidney disease, stage 3 unspecified I50.42 Chronic combined systolic (congestive) and diastolic (congestive) heart failure Z87.820 Personal history of traumatic brain injury Facility Procedures : CPT4 Code: [id number] Description: [id number] - WOUND CARE VISIT-LEV 2 EST PT Modifier: Quantity: 1 Physician Procedures : CPT4 Code Description Modifier [id number] - WC PHYS LEVEL 3 - EST PT ICD-10 Diagnosis Description I87.331 Chronic venous hypertension (idiopathic) with ulcer and inflammation of right lower extremity I89.0 Lymphedema, not elsewhere classified  L97.812 Non-pressure chronic ulcer of other part of right lower leg with fat layer exposed N18.30 Chronic kidney disease, stage 3 unspecified Quantity: 1 Electronic Signature(s) Signed: 07/30/2022 10:53:24 AM By: Worthy Keeler PA-C Entered By: Worthy Keeler on 07/30/2022 10:53:24

## 2022-09-07 ENCOUNTER — Encounter: Payer: Medicare Other | Attending: Physician Assistant | Admitting: Physician Assistant

## 2022-09-07 DIAGNOSIS — N183 Chronic kidney disease, stage 3 unspecified: Secondary | ICD-10-CM | POA: Diagnosis not present

## 2022-09-07 DIAGNOSIS — L97822 Non-pressure chronic ulcer of other part of left lower leg with fat layer exposed: Secondary | ICD-10-CM | POA: Insufficient documentation

## 2022-09-07 DIAGNOSIS — I5042 Chronic combined systolic (congestive) and diastolic (congestive) heart failure: Secondary | ICD-10-CM | POA: Diagnosis not present

## 2022-09-07 DIAGNOSIS — I890 Lymphedema, not elsewhere classified: Secondary | ICD-10-CM | POA: Diagnosis not present

## 2022-09-07 DIAGNOSIS — I872 Venous insufficiency (chronic) (peripheral): Secondary | ICD-10-CM | POA: Insufficient documentation

## 2022-09-07 DIAGNOSIS — I130 Hypertensive heart and chronic kidney disease with heart failure and stage 1 through stage 4 chronic kidney disease, or unspecified chronic kidney disease: Secondary | ICD-10-CM | POA: Insufficient documentation

## 2022-09-07 DIAGNOSIS — I87331 Chronic venous hypertension (idiopathic) with ulcer and inflammation of right lower extremity: Secondary | ICD-10-CM | POA: Diagnosis not present

## 2022-09-07 DIAGNOSIS — Z87820 Personal history of traumatic brain injury: Secondary | ICD-10-CM | POA: Insufficient documentation

## 2022-09-07 DIAGNOSIS — Z09 Encounter for follow-up examination after completed treatment for conditions other than malignant neoplasm: Secondary | ICD-10-CM | POA: Insufficient documentation

## 2022-09-07 NOTE — Progress Notes (Signed)
KENDELL, STARIN ([id number]) [id number] Page 1 of 6 Visit Report for 09/07/2022 Allergy List Details Patient Name: Date of Service: [name], [name] 09/07/2022 8:45 A M Medical Record Number: [id number] Patient Account Number: [id number] Date of Birth/Sex: Treating RN: 1945-12-18 (77 y.o. [name] Primary Care Cilicia Borden: Frazier Richards Other Clinician: Referring Lainie Daubert: Treating Ayleah Hofmeister/Extender: Benay Pillow in Treatment: 0 Allergies Active Allergies morphine procaine codeine iodine latex oxycodone Sulfa (Sulfonamide Antibiotics) buspirone gabapentin hydrocodone Allergy Notes Electronic Signature(s) Signed: 09/07/2022 3:50:04 PM By: Carlene Coria RN Entered By: Carlene Coria on 09/07/2022 09:18:26 -------------------------------------------------------------------------------- Arrival Information Details Patient Name: Date of Service: Robin LLIFIELD RO GO Robin Cork. 09/07/2022 8:45 A M Medical Record Number: [id number] Patient Account Number: [id number] Date of Birth/Sex: Treating RN: 1945-07-10 (77 y.o. [name] Primary Care Alba Perillo: Frazier Richards Other Clinician: Referring Raekwon Winkowski: Treating Berk Pilot/Extender: Benay Pillow in Treatment: 0 Visit Information Patient Arrived: Ambulatory Arrival Time: 09:16 Accompanied By: self Transfer Assistance: None Patient Identification Verified: Yes Secondary Verification Process Completed: Yes Patient Requires Transmission-Based Precautions: No Patient Has Alerts: No History Since Last Visit Added or deleted any medications: No Any new allergies or adverse reactions: No Had a fall or experienced change in activities of daily living that may affect risk of falls: No Signs or symptoms of abuse/neglect since last visito No Hospitalized since last visit: No Implantable device outside of the clinic  excluding cellular tissue based products placed in the center since last visit: No Has Dressing in Place as Prescribed: Yes Has Compression in Place as Prescribed: Yes Pain Present Now: No Electronic Signature(s) Signed: 09/07/2022 3:50:04 PM By: Carlene Coria RN Carole Binning, Tenna Child ([id number]) [id number] Page 2 of 6 Entered By: Carlene Coria on 09/07/2022 09:17:03 -------------------------------------------------------------------------------- Clinic Level of Care Assessment Details Patient Name: Date of Service: Robin LLIFIELD RO GO [name], [name] 09/07/2022 8:45 A M Medical Record Number: [id number] Patient Account Number: [id number] Date of Birth/Sex: Treating RN: September 03, 1945 (77 y.o. [name] Primary Care Issis Lindseth: Frazier Richards Other Clinician: Referring Teosha Casso: Treating Khayree Delellis/Extender: Benay Pillow in Treatment: 0 Clinic Level of Care Assessment Items TOOL 4 Quantity Score X- 1 0 Use when only an EandM is performed on FOLLOW-UP visit ASSESSMENTS - Nursing Assessment / Reassessment X- 1 10 Reassessment of Co-morbidities (includes updates in patient status) X- 1 5 Reassessment of Adherence to Treatment Plan ASSESSMENTS - Wound and Skin A ssessment / Reassessment []  - 0 Simple Wound Assessment / Reassessment - one wound []  - 0 Complex Wound Assessment / Reassessment - multiple wounds []  - 0 Dermatologic / Skin Assessment (not related to wound area) ASSESSMENTS - Focused Assessment []  - 0 Circumferential Edema Measurements - multi extremities []  - 0 Nutritional Assessment / Counseling / Intervention X- 1 5 Lower Extremity Assessment (monofilament, tuning fork, pulses) []  - 0 Peripheral Arterial Disease Assessment (using hand held doppler) ASSESSMENTS - Ostomy and/or Continence Assessment and Care []  - 0 Incontinence Assessment and Management []  - 0 Ostomy Care Assessment and Management  (repouching, etc.) PROCESS - Coordination of Care X - Simple Patient / Family Education for ongoing care 1 15 []  - 0 Complex (extensive) Patient / Family Education for ongoing care []  - 0 Staff obtains Programmer, systems, Records, T Results / Process Orders est []  - 0 Staff telephones HHA, Nursing Homes / Clarify orders / etc []  - 0 Routine Transfer to another Facility (non-emergent condition) []  - 0 Routine  Hospital Admission (non-emergent condition) X- 1 15 New Admissions / Biomedical engineer / Ordering NPWT Apligraf, etc. , []  - 0 Emergency Hospital Admission (emergent condition) X- 1 10 Simple Discharge Coordination []  - 0 Complex (extensive) Discharge Coordination PROCESS - Special Needs []  - 0 Pediatric / Minor Patient Management []  - 0 Isolation Patient Management []  - 0 Hearing / Language / Visual special needs []  - 0 Assessment of Community assistance (transportation, D/C planning, etc.) []  - 0 Additional assistance / Altered mentation []  - 0 Support Surface(s) Assessment (bed, cushion, seat, etc.) INTERVENTIONS - Wound Cleansing / Measurement []  - 0 Simple Wound Cleansing - one wound SAFFIYAH, BRACKER ([id number] Page 3 of 6 []  - 0 Complex Wound Cleansing - multiple wounds []  - 0 Wound Imaging (photographs - any number of wounds) []  - 0 Wound Tracing (instead of photographs) []  - 0 Simple Wound Measurement - one wound []  - 0 Complex Wound Measurement - multiple wounds INTERVENTIONS - Wound Dressings []  - 0 Small Wound Dressing one or multiple wounds []  - 0 Medium Wound Dressing one or multiple wounds []  - 0 Large Wound Dressing one or multiple wounds []  - 0 Application of Medications - topical []  - 0 Application of Medications - injection INTERVENTIONS - Miscellaneous []  - 0 External ear exam []  - 0 Specimen Collection (cultures, biopsies, blood, body fluids, etc.) []  - 0 Specimen(s) /  Culture(s) sent or taken to Lab for analysis []  - 0 Patient Transfer (multiple staff / Civil Service fast streamer / Similar devices) []  - 0 Simple Staple / Suture removal (25 or less) []  - 0 Complex Staple / Suture removal (26 or more) []  - 0 Hypo / Hyperglycemic Management (close monitor of Blood Glucose) []  - 0 Ankle / Brachial Index (ABI) - do not check if billed separately X- 1 5 Vital Signs Has the patient been seen at the hospital within the last three years: Yes Total Score: 65 Level Of Care: New/Established - Level 2 Electronic Signature(s) Signed: 09/07/2022 3:50:04 PM By: Carlene Coria RN Entered By: Carlene Coria on 09/07/2022 09:52:04 -------------------------------------------------------------------------------- Encounter Discharge Information Details Patient Name: Date of Service: Robin LLIFIELD RO GO Robin Cork. 09/07/2022 8:45 A M Medical Record Number: [id number] Patient Account Number: [id number] Date of Birth/Sex: Treating RN: 07-20-1945 (77 y.o. [name] Primary Care Polo Mcmartin: Frazier Richards Other Clinician: Referring Jawanza Zambito: Treating Shondrea Steinert/Extender: Benay Pillow in Treatment: 0 Encounter Discharge Information Items Discharge Condition: Stable Ambulatory Status: Cane Discharge Destination: Home Transportation: Private Auto Accompanied By: self Schedule Follow-up Appointment: No Clinical Summary of Care: Electronic Signature(s) Signed: 09/07/2022 3:50:04 PM By: Carlene Coria RN Entered By: Carlene Coria on 09/07/2022 09:53:43 Ciro Backer ([id number] Page 4 of 6 -------------------------------------------------------------------------------- Lower Extremity Assessment Details Patient Name: Date of Service: Robin LLIFIELD RO GO [name], [name] 09/07/2022 8:45 A M Medical Record Number: [id number] Patient Account Number: [id number] Date of Birth/Sex: Treating RN: [birth year] (77 y.o. [name] Primary Care Lakeasha Petion: Frazier Richards Other Clinician: Referring Quintell Bonnin: Treating Frances Joynt/Extender: Benay Pillow in Treatment: 0 Edema Assessment Assessed: [Left: No] [Right: No] Edema: [Left: N] [Right: o] Calf Left: Right: Point of Measurement: 30 cm From Medial Instep 36 cm Ankle Left: Right: Point of Measurement: 10 cm From Medial Instep 18 cm Knee To Floor Left: Right: From Medial Instep 40 cm Vascular Assessment Pulses: Dorsalis Pedis Palpable: [Left:Yes] Electronic Signature(s) Signed: 09/07/2022 3:50:04 PM By: Carlene Coria RN Entered By: Dolores Lory  Carrie on 09/07/2022 [id number] -------------------------------------------------------------------------------- Multi Wound Chart Details Patient Name: Date of Service: Robin LLIFIELD RO GO [name], [name] 09/07/2022 8:45 A M Medical Record Number: [id number] Patient Account Number: [id number] Date of Birth/Sex: Treating RN: [birth year] (77 y.o. [name] Primary Care Arlette Schaad: Frazier Richards Other Clinician: Referring Johngabriel Verde: Treating Olawale Marney/Extender: Benay Pillow in Treatment: 0 Vital Signs Height(in): 66 Pulse(bpm): 75 Weight(lbs): 224 Blood Pressure(mmHg): 155/72 Body Mass Index(BMI): 36.2 Temperature(F): 98.2 Respiratory Rate(breaths/min): 18 [Treatment Notes:Wound Assessments Treatment Notes] Electronic Signature(s) Signed: 09/07/2022 3:50:04 PM By: Carlene Coria RN Entered By: Carlene Coria on 09/07/2022 09:45:43 -------------------------------------------------------------------------------- Multi-Disciplinary Care Plan Details Patient Name: Date of Service: Robin LLIFIELD RO GO Robin Cork. 09/07/2022 8:45 A M Medical Record Number: [id number] Patient Account Number: [id number] Date of Birth/Sex: Treating RN: 11-03-1945 (77 y[address]o. [address] Phill Mutter [place], [place] J ([id number]) [id number] Page 5 of  6 Primary Care Giovonni Poirier: Frazier Richards Other Clinician: Referring Jazmen Lindenbaum: Treating Kayah Hecker/Extender: Benay Pillow in Treatment: 0 Active Inactive Electronic Signature(s) Signed: 09/07/2022 3:50:04 PM By: Carlene Coria RN Entered By: Carlene Coria on 09/07/2022 09:52:42 -------------------------------------------------------------------------------- Pain Assessment Details Patient Name: Date of Service: Robin LLIFIELD RO GO [name], [name] 09/07/2022 8:45 A M Medical Record Number: [id number] Patient Account Number: [id number] Date of Birth/Sex: Treating RN: 11-27-1945 (77 y.o. [name] Primary Care Omara Alcon: Frazier Richards Other Clinician: Referring Grayton Lobo: Treating Alaynna Kerwood/Extender: Benay Pillow in Treatment: 0 Active Problems Location of Pain Severity and Description of Pain Patient Has Paino No Site Locations Pain Management and Medication Current Pain Management: Electronic Signature(s) Signed: 09/07/2022 3:50:04 PM By: Carlene Coria RN Entered By: Carlene Coria on 09/07/2022 09:17:10 -------------------------------------------------------------------------------- Patient/Caregiver Education Details Patient Name: Date of Service: Robin LLIFIELD RO GO Robin Cork 3/25/2024andnbsp8:45 A M Medical Record Number: [id number] Patient Account Number: [id number] Date of Birth/Gender: Treating RN: [birth year] (77 y.o. [name] Primary Care Physician: Frazier Richards Other Clinician: Referring Physician: Treating Physician/Extender: Benay Pillow in Treatment: 0 Education Assessment Education Provided To: Patient [name], [name] ([id number]) [id number] Page 6 of 6 Education Topics Provided Welcome [facility] Packet: o Methods: Explain/Verbal Responses: State content correctly Electronic Signature(s) Signed: 09/07/2022  3:50:04 PM By: Carlene Coria RN Entered By: Carlene Coria on 09/07/2022 09:53:01 -------------------------------------------------------------------------------- [name] Details Patient Name: Date of Service: Robin LLIFIELD RO GO Robin Cork. 09/07/2022 8:45 A M Medical Record Number: [id number] Patient Account Number: [id number] Date of Birth/Sex: Treating RN: 03-30-1946 (76 y.o. [name] Primary Care Zaynab Chipman: Frazier Richards Other Clinician: Referring Tor Tsuda: Treating Socrates Cahoon/Extender: Benay Pillow in Treatment: 0 Vital Signs Time Taken: 09:17 Temperature (F): 98.2 Height (in): 66 Pulse (bpm): 75 Source: Stated Respiratory Rate (breaths/min): 18 Weight (lbs): 224 Blood Pressure (mmHg): 155/72 Source: Stated Reference Range: 80 - 120 mg / dl Body Mass Index (BMI): 36.2 Electronic Signature(s) Signed: 09/07/2022 3:50:04 PM By: Carlene Coria RN Entered By: Carlene Coria on 09/07/2022 09:18:03

## 2022-09-07 NOTE — Progress Notes (Signed)
TEE, NEVE ([id number]) [phone] [id number] Page 1 of 4 Visit Report for 09/07/2022 Abuse Risk Screen Details Patient Name: Date of Service: [name], [name] 09/07/2022 8:45 A M Medical Record Number: [id number] Patient Account Number: [id number] Date of Birth/Sex: Treating RN: [birth year] [name] (77 y.o. [name] Primary Care Kauan Kloosterman: Frazier Richards Other Clinician: Referring Joahan Swatzell: Treating Iram Astorino/Extender: Benay Pillow in Treatment: 0 Abuse Risk Screen Items Answer ABUSE RISK SCREEN: Has anyone close to you tried to hurt or harm you recentlyo No Do you feel uncomfortable with anyone in your familyo No Has anyone forced you do things that you didnt want to doo No Electronic Signature(s) Signed: 09/07/2022 3:50:04 PM By: Carlene Coria RN Entered By: Carlene Coria on 09/07/2022 09:19:03 -------------------------------------------------------------------------------- Activities of Daily Living Details Patient Name: Date of Service: [name], [name] 09/07/2022 8:45 A M Medical Record Number: [id number] Patient Account Number: [id number] Date of Birth/Sex: Treating RN: [birth year][name] (77 y.o. [name] Primary Care Tiny Rietz: Frazier Richards Other Clinician: Referring Terrie Grajales: Treating Tiphanie Vo/Extender: Benay Pillow in Treatment: 0 Activities of Daily Living Items Answer Activities of Daily Living (Please select one for each item) Drive Automobile Completely Able T Medications ake Completely Able Use T elephone Completely Able Care for Appearance Completely Able Use T oilet Completely Able Bath / Shower Completely Able Dress Self Completely Able Feed Self Completely Able Walk Completely Able Get In / Out Bed Completely Able Housework Completely Able Prepare Meals Completely Able Handle Money Completely Able Shop for Self Completely  Able Electronic Signature(s) Signed: 09/07/2022 3:50:04 PM By: Carlene Coria RN Entered By: Carlene Coria on 09/07/2022 09:19:43 -------------------------------------------------------------------------------- Education Screening Details Patient Name: Date of Service: Robin LLIFIELD RO GO [name]. 09/07/2022 8:45 A M Medical Record Number: [id number] Patient Account Number: [id number] Date of Birth/Sex: Treating RN: [name]/[birth year]/[name] (77 y.o. [name] Primary Care Correll Denbow: Frazier Richards Other Clinician: Referring Abdifatah Colquhoun: Treating Callin Ashe/Extender: Benay Pillow in Treatment: 0 [name], [name] ([id number]) [phone] [id number] Page 2 of 4 Primary Learner Assessed: Patient Learning Preferences/Education Level/Primary Language Learning Preference: Explanation Highest Education Level: College or Above Preferred Language: English Cognitive Barrier Language Barrier: No Translator Needed: No Memory Deficit: No Emotional Barrier: No Cultural/Religious Beliefs Affecting Medical Care: No Physical Barrier Impaired Vision: No Impaired Hearing: No Decreased Hand dexterity: No Knowledge/Comprehension Knowledge Level: Medium Comprehension Level: High Ability to understand written instructions: High Ability to understand verbal instructions: High Motivation Anxiety Level: Anxious Cooperation: Cooperative Education Importance: Acknowledges Need Interest in Health Problems: Asks Questions Perception: Coherent Willingness to Engage in Self-Management High Activities: Readiness to Engage in Self-Management High Activities: Electronic Signature(s) Signed: 09/07/2022 3:50:04 PM By: Carlene Coria RN Entered By: Carlene Coria on 09/07/2022 09:20:28 -------------------------------------------------------------------------------- Fall Risk Assessment Details Patient Name: Date of Service: Robin LLIFIELD RO GO [name].  09/07/2022 8:45 A M Medical Record Number: [id number] Patient Account Number: [id number] Date of Birth/Sex: Treating RN: [name], [name] (77 y.o. [name] Primary Care Montserrat Shek: Frazier Richards Other Clinician: Referring Kellyn Mccary: Treating Nuri Larmer/Extender: Benay Pillow in Treatment: 0 Fall Risk Assessment Items Have you had 2 or more falls in the last 12 monthso 0 Yes Have you had any fall that resulted in injury in the last 12 monthso 0 Yes FALLS RISK SCREEN History of falling - immediate or within 3 months 25 Yes Secondary diagnosis (Do you have 2 or more medical diagnoseso) 0 No  Ambulatory aid None/bed rest/wheelchair/nurse 0 No Crutches/cane/walker 15 Yes Furniture 0 No Intravenous therapy Access/Saline/Heparin Lock 0 No Gait/Transferring Normal/ bed rest/ wheelchair 0 Yes Weak (short steps with or without shuffle, stooped but able to lift head while walking, may seek 0 No support from furniture) Impaired (short steps with shuffle, may have difficulty arising from chair, head down, impaired 0 No balance) Mental Status Oriented to own ability 0 Yes Overestimates or forgets limitations 0 No Risk Level: Medium Risk Score: [address] [name], [name] ([id number]) [id number] Page 3 of 4 Electronic Signature(s) -------------------------------------------------------------------------------- Foot Assessment Details Patient Name: Date of Service: [name], [name] 09/07/2022 8:45 A M Medical Record Number: [id number] Patient Account Number: [id number] Date of Birth/Sex: Treating RN: [birth year] [name] (77 y.o. [name] Primary Care Margrett Kalb: Frazier Richards Other Clinician: Referring Emit Kuenzel: Treating Kyler Lerette/Extender: Benay Pillow in Treatment: 0 Foot Assessment Items Site Locations + = Sensation present, - = Sensation absent, C = Callus, U = Ulcer R = Redness, W =  Warmth, M = Maceration, PU = Pre-ulcerative lesion F = Fissure, S = Swelling, D = Dryness Assessment Right: Left: Other Deformity: No No Prior Foot Ulcer: No No Prior Amputation: No No Charcot Joint: No No Ambulatory Status: Ambulatory Without Help Gait: Steady Electronic Signature(s) Signed: 09/07/2022 3:50:04 PM By: Carlene Coria RN Entered By: Carlene Coria on 09/07/2022 09:27:15 -------------------------------------------------------------------------------- Nutrition Risk Screening Details Patient Name: Date of Service: Robin LLIFIELD [employer] ALANTIS, SEIFERT 09/07/2022 8:45 A M Medical Record Number: [id number] Patient Account Number: [id number] Date of Birth/Sex: Treating RN: [birth year], [name] (77 y.o. [name] Primary Care Paidyn Mcferran: Frazier Richards Other Clinician: Referring Vadie Principato: Treating Kajsa Butrum/Extender: Benay Pillow in Treatment: 0 Height (in): 66 Weight (lbs): 224 Body Mass Index (BMI): 36.2 ALEZAY, HARPOLD ([id number]) [id number] Page 4 of 4 Nutrition Risk Screening Items Score Screening NUTRITION RISK SCREEN: I have an illness or condition that made me change the kind and/or amount of food I eat 0 No I eat fewer than two meals per day 0 No I eat few fruits and vegetables, or milk products 0 No I have three or more drinks of beer, liquor or wine almost every day 0 No I have tooth or mouth problems that make it hard for me to eat 0 No I don't always have enough money to buy the food I need 0 No I eat alone most of the time 0 No I take three or more different prescribed or over-the-counter drugs a day 1 Yes Without wanting to, I have lost or gained 10 pounds in the last six months 0 No I am not always physically able to shop, cook and/or feed myself 0 No Nutrition Protocols Good Risk Protocol 0 No interventions needed Moderate Risk Protocol High Risk Proctocol Risk Level: Good Risk Score:  1 Electronic Signature(s) Signed: 09/07/2022 3:50:04 PM By: Carlene Coria RN Entered By: Carlene Coria on 09/07/2022 09:21:21

## 2022-09-07 NOTE — Progress Notes (Signed)
[name], [name] ([id number]) [id number] Page 1 of 6 Visit Report for 09/07/2022 Chief Complaint Document Details Patient Name: Date of Service: [name] BRIANNY, MANSOURI 09/07/2022 8:45 A M Medical Record Number: [id number] Patient Account Number: [id number] Date of Birth/Sex: Treating RN: [birth year] (77 y.o. [name] Primary Care Provider: Frazier [name] Other Clinician: Referring Provider: Treating Provider/Extender: [name] in Treatment: 0 Information Obtained from: Patient Chief Complaint Left LE Ulcer Electronic Signature(s) Signed: 09/07/2022 9:41:04 AM By: Robin Keeler PA-C Entered By: [name] on 09/07/2022 09:41:04 -------------------------------------------------------------------------------- HPI Details Patient Name: Date of Service: [name]. 09/07/2022 8:45 A M Medical Record Number: [id number] Patient Account Number: [id number] Date of Birth/Sex: Treating RN: [birth year] (77 y.o. [name] Primary Care Provider: Frazier [name] Other Clinician: Referring Provider: Treating Provider/Extender: [name] in Treatment: 0 History of Present Illness HPI Description: 05-19-2022 upon evaluation today patient appears to be doing poorly in regard to wounds of the right lateral lower extremity. The good news is these do not appear to be too significant the bad news is they are somewhat painful. This on 05-13-2022 started more as blisters and have continued to worsen. Fortunately I do not see any signs of severe infection at this point which is good news. Nonetheless I do believe there is some evidence of infection and cellulitis here. Patient does have a history of chronic venous insufficiency, lymphedema, chronic kidney disease stage III, congestive heart failure, and personal history of traumatic brain injury. 05-26-2022  upon evaluation today patient actually appears to be doing much better in regard to her leg ulcer. She has been tolerating the dressing changes without complication and overall I am actually extremely pleased with where we stand today. Fortunately there does not appear to be any signs of infection locally nor systemically at this point I do believe the antibiotics have been beneficial. 06-02-2022 upon evaluation today patient appears to be doing much better in regard to her wound. She is actually showing signs of excellent improvement the compression wrapping seems to be doing quite well for her. Fortunately I do not see any signs of infection locally or systemically at this time which is great news. No fevers, chills, nausea, vomiting, or diarrhea. 12/26; the patient only has a very small open area left on the right lateral lower leg. Her edema control is excellent. We have ordered her juxta lites in an attempt to control the swelling in her legs. She tells [name] that only 1 stocking was sent for the right leg. She cannot get standard over the toe stockings on 06-19-2021 upon evaluation today patient appears to be doing excellent in regard to her wound. She is actually tolerating the dressing changes without complication. Fortunately I do not see any signs of infection locally nor systemically which is great news and overall I do believe that we are headed in the right direction. 07-02-2022 upon evaluation today patient appears to be doing well currently in regard to her wound which is very small but also has some callus and dry buildup noted at this point. Again I think that some of this is related to the dressing material, Prisma which there just really is not quite enough drainage for at this point to completely help in that regard. Overall I am extremely pleased with where we stand however. 1/25; patient has a small wound on the right lateral lower leg. Under illumination surface of  the small wound  appears to have some debris in it. It measures smaller today however and her edema control is quite decent. Changed from Prisma to Xeroform last week when the Prisma stuck to the wound surface 07-16-2022 upon evaluation today patient appears to be doing well with regard to her wound. This in fact is almost completely closed there is a lot of new skin and barely anything remaining open at this point. Fortunately I do not see any signs of infection locally nor systemically which is great news. No fevers, chills, nausea, vomiting, or diarrhea. 07-30-2022 upon evaluation today patient appears to be doing excellent in regard to her leg which is actually showing signs of complete improvement this is great news. Overall I think she is healed and ready for discharge good news as she does have her juxta lite compression wrap with her today. Readmission: 09-07-22 upon evaluation today patient actually came back in for reevaluation concerning an issue on her left leg. With that being said upon inspection once we removed the Unna boot wrap there actually does not appear to be anything that is open at this point. I can see the remnants of where something was but she is doing very well at this time. We discussed in greater detail compression therapy and everything we had discussed previously when she was discharged back [name] ([id number]) [id number] Page 2 of 6 in February. This included that she needs to be making sure to wear compression socks every day. That something she was not doing. Obviously this can help keep things under control and prevent this from getting worse yet again. Nothing is changed in her medical history since I last saw her in February. Electronic Signature(s) Signed: 09/09/2022 9:56:47 AM By: Robin Keeler PA-C Previous Signature: 09/09/2022 9:56:32 AM Version By: Robin Keeler PA-C Previous Signature: 09/07/2022 4:44:38 PM Version By:  Robin Keeler PA-C Entered By: [name] on 09/09/2022 09:56:46 -------------------------------------------------------------------------------- Physical Exam Details Patient Name: Date of Service: [name] [name], [name]. 09/07/2022 8:45 A M Medical Record Number: [id number] Patient Account Number: [id number] Date of Birth/Sex: Treating RN: April 01, 1946 (77 y.o. [name] Primary Care Provider: Frazier [name] Other Clinician: Referring Provider: Treating Provider/Extender: [name] in Treatment: 0 Constitutional patient is hypertensive.. pulse regular and within target range for patient.Marland Kitchen respirations regular, non-labored and within target range for patient.Marland Kitchen temperature within target range for patient.. Obese and well-hydrated in no acute distress. Eyes conjunctiva clear no eyelid edema noted. pupils equal round and reactive to light and accommodation. Ears, Nose, Mouth, and Throat no gross abnormality of ear auricles or external auditory canals. normal hearing noted during conversation. mucus membranes moist. Respiratory normal breathing without difficulty. Cardiovascular 2+ dorsalis pedis/posterior tibialis pulses. 1+ pitting edema of the bilateral lower extremities. Musculoskeletal normal gait and posture. Psychiatric this patient is able to make decisions and demonstrates good insight into disease process. Alert and Oriented x 3. pleasant and cooperative. Notes Upon inspection patient's wound again is showing signs of complete resolution there is actually no need for intervention from a wound care standpoint today and I think that she is really doing quite well. She does need to wear compression however regularly that is something she has not been doing. Electronic Signature(s) Signed: 09/09/2022 9:57:33 AM By: Robin Keeler PA-C Entered By: [name] on 09/09/2022  09:57:33 -------------------------------------------------------------------------------- Physician Orders Details Patient Name: Date of Service: [name] Cheron Every  J. 09/07/2022 8:45 A M Medical Record Number: [id number] Patient Account Number: [id number] Date of Birth/Sex: Treating RN: [birth year] (77 y.o. [name] Primary Care Provider: Frazier [name] Other Clinician: Referring Provider: Treating Provider/Extender: [name] in Treatment: 0 Verbal / Phone Orders: No Diagnosis Coding ICD-10 Coding Code Description I87.331 Chronic venous hypertension (idiopathic) with ulcer and inflammation of right lower extremity I89.0 Lymphedema, not elsewhere classified L97.822 Non-pressure chronic ulcer of other part of left lower leg with fat layer exposed N18.30 Chronic kidney disease, stage 3 unspecified I50.42 Chronic combined systolic (congestive) and diastolic (congestive) heart failure [name], [name] ([id number]) [id number] Page 3 of 6 [phone] Personal history of traumatic brain injury Discharge From [facility] Wear compression garments daily. Put garments on first thing when you wake up and remove them before bed. - tubi grip size C single layer applied until patient can get home and apply juxtalite Moisturize legs daily after removing compression garments. Electronic Signature(s) Signed: 09/07/2022 3:50:04 PM By: Carlene Coria RN Signed: 09/07/2022 4:46:02 PM By: Robin Keeler PA-C Entered By: Carlene Coria on 09/07/2022 09:51:20 -------------------------------------------------------------------------------- Problem List Details Patient Name: Date of Service: [name]. 09/07/2022 8:45 A M Medical Record Number: [id number] Patient Account Number: [id number] Date of Birth/Sex: Treating RN: [birth year] (77 y.o. [name] Primary Care Provider: Frazier [name] Other  Clinician: Referring Provider: Treating Provider/Extender: [name] in Treatment: 0 Active Problems ICD-10 Encounter Code Description Active Date MDM Diagnosis I87.331 Chronic venous hypertension (idiopathic) with ulcer and inflammation of right 09/07/2022 No Yes lower extremity I89.0 Lymphedema, not elsewhere classified 09/07/2022 No Yes L97.822 Non-pressure chronic ulcer of other part of left lower leg with fat layer exposed3/25/2024 No Yes N18.30 Chronic kidney disease, stage 3 unspecified 09/07/2022 No Yes I50.42 Chronic combined systolic (congestive) and diastolic (congestive) heart failure 09/07/2022 No Yes Z87.820 Personal history of traumatic brain injury 09/07/2022 No Yes Inactive Problems Resolved Problems Electronic Signature(s) Signed: 09/07/2022 9:40:51 AM By: Robin Keeler PA-C Entered By: [name] on 09/07/2022 09:40:50 -------------------------------------------------------------------------------- Progress Note Details Patient Name: Date of Service: [name]. 09/07/2022 8:45 A M Medical Record Number: [id number] Patient Account Number: [id number] Date of Birth/Sex: Treating RN: Jan 20, 1946 (77 y.o. [name] Primary Care Provider: Frazier [name] Other Clinician: Referring Provider: Treating Provider/Extender: Elvina Sidle [place], [place] ([id number]) [id number] Page 4 of 6 Weeks in Treatment: 0 Subjective Chief Complaint Information obtained from Patient Left LE Ulcer History of Present Illness (HPI) 05-19-2022 upon evaluation today patient appears to be doing poorly in regard to wounds of the right lateral lower extremity. The good news is these do not appear to be too significant the bad news is they are somewhat painful. This on 05-13-2022 started more as blisters and have continued to worsen. Fortunately I do not see any signs of  severe infection at this point which is good news. Nonetheless I do believe there is some evidence of infection and cellulitis here. Patient does have a history of chronic venous insufficiency, lymphedema, chronic kidney disease stage III, congestive heart failure, and personal history of traumatic brain injury. 05-26-2022 upon evaluation today patient actually appears to be doing much better in regard to her leg ulcer. She has been tolerating the dressing changes without complication and overall I am actually extremely pleased with where we stand today. Fortunately there does not appear to be any signs of infection locally  nor systemically at this point I do believe the antibiotics have been beneficial. 06-02-2022 upon evaluation today patient appears to be doing much better in regard to her wound. She is actually showing signs of excellent improvement the compression wrapping seems to be doing quite well for her. Fortunately I do not see any signs of infection locally or systemically at this time which is great news. No fevers, chills, nausea, vomiting, or diarrhea. 12/26; the patient only has a very small open area left on the right lateral lower leg. Her edema control is excellent. We have ordered her juxta lites in an attempt to control the swelling in her legs. She tells [name] that only 1 stocking was sent for the right leg. She cannot get standard over the toe stockings on 06-19-2021 upon evaluation today patient appears to be doing excellent in regard to her wound. She is actually tolerating the dressing changes without complication. Fortunately I do not see any signs of infection locally nor systemically which is great news and overall I do believe that we are headed in the right direction. 07-02-2022 upon evaluation today patient appears to be doing well currently in regard to her wound which is very small but also has some callus and dry buildup noted at this point. Again I think that some of  this is related to the dressing material, Prisma which there just really is not quite enough drainage for at this point to completely help in that regard. Overall I am extremely pleased with where we stand however. 1/25; patient has a small wound on the right lateral lower leg. Under illumination surface of the small wound appears to have some debris in it. It measures smaller today however and her edema control is quite decent. Changed from Prisma to Xeroform last week when the Prisma stuck to the wound surface 07-16-2022 upon evaluation today patient appears to be doing well with regard to her wound. This in fact is almost completely closed there is a lot of new skin and barely anything remaining open at this point. Fortunately I do not see any signs of infection locally nor systemically which is great news. No fevers, chills, nausea, vomiting, or diarrhea. 07-30-2022 upon evaluation today patient appears to be doing excellent in regard to her leg which is actually showing signs of complete improvement this is great news. Overall I think she is healed and ready for discharge good news as she does have her juxta lite compression wrap with her today. Readmission: 09-07-22 upon evaluation today patient actually came back in for reevaluation concerning an issue on her left leg. With that being said upon inspection once we removed the Unna boot wrap there actually does not appear to be anything that is open at this point. I can see the remnants of where something was but she is doing very well at this time. We discussed in greater detail compression therapy and everything we had discussed previously when she was discharged back in February. This included that she needs to be making sure to wear compression socks every day. That something she was not doing. Obviously this can help keep things under control and prevent this from getting worse yet again. Nothing is changed in her medical history since I last saw  her in February. Patient History Allergies morphine, procaine, codeine, iodine, latex, oxycodone, Sulfa (Sulfonamide Antibiotics), buspirone, gabapentin, hydrocodone Social History Never smoker, Marital Status - Married, Alcohol Use - Rarely, Drug Use - No History, Caffeine Use - Daily. Medical  History Cardiovascular Patient has history of Congestive Heart Failure, Hypertension Objective Constitutional patient is hypertensive.. pulse regular and within target range for patient.Marland Kitchen respirations regular, non-labored and within target range for patient.Marland Kitchen temperature within target range for patient.. Obese and well-hydrated in no acute distress. Vitals Time Taken: 9:17 AM, Height: 66 in, Source: Stated, Weight: 224 lbs, Source: Stated, BMI: 36.2, Temperature: 98.2 F, Pulse: 75 bpm, Respiratory Rate: 18 breaths/min, Blood Pressure: 155/72 mmHg. Eyes conjunctiva clear no eyelid edema noted. pupils equal round and reactive to light and accommodation. [name], [name] ([id number]) [id number] Page 5 of 6 Ears, Nose, Mouth, and Throat no gross abnormality of ear auricles or external auditory canals. normal hearing noted during conversation. mucus membranes moist. Respiratory normal breathing without difficulty. Cardiovascular 2+ dorsalis pedis/posterior tibialis pulses. 1+ pitting edema of the bilateral lower extremities. Musculoskeletal normal gait and posture. Psychiatric this patient is able to make decisions and demonstrates good insight into disease process. Alert and Oriented x 3. pleasant and cooperative. General Notes: Upon inspection patient's wound again is showing signs of complete resolution there is actually no need for intervention from a wound care standpoint today and I think that she is really doing quite well. She does need to wear compression however regularly that is something she has not been doing. Assessment Active  Problems ICD-10 Chronic venous hypertension (idiopathic) with ulcer and inflammation of right lower extremity Lymphedema, not elsewhere classified Non-pressure chronic ulcer of other part of left lower leg with fat layer exposed Chronic kidney disease, stage 3 unspecified Chronic combined systolic (congestive) and diastolic (congestive) heart failure Personal history of traumatic brain injury Plan Discharge From [facility]: Wear compression garments daily. Put garments on first thing when you wake up and remove them before bed. - tubi grip size C single layer applied until patient can get home and apply juxtalite Moisturize legs daily after removing compression garments. 1. I would recommend currently that the patient should continue with compression therapy as previously discussed with her. She again needs to put these on first thing in the morning when she wakes up. We put Tubigrip on her today but she does have compression garments at home. 2. I am also going to suggest she should be elevating her leg is much as possible throughout her day obviously this is to be of utmost importance. We will see her back for follow-up visit as needed. Electronic Signature(s) Signed: 09/09/2022 9:58:11 AM By: Robin Keeler PA-C Entered By: [name] on 09/09/2022 09:58:10 -------------------------------------------------------------------------------- ROS/PFSH Details Patient Name: Date of Service: [name]. 09/07/2022 8:45 A M Medical Record Number: [id number] Patient Account Number: [id number] Date of Birth/Sex: Treating RN: Sep 12, 1945 (77 y.o. [name] Primary Care Provider: Frazier [name] Other Clinician: Referring Provider: Treating Provider/Extender: [name] in Treatment: 0 Cardiovascular Medical History: Positive for: Congestive Heart Failure; Hypertension Immunizations Pneumococcal Vaccine: Received Pneumococcal  Vaccination: Yes Received Pneumococcal Vaccination On or After [address], [place] ([id number]) [phone].pdf Page 6 of 6 Implantable Devices None Family and Social History Never smoker; Marital Status - Married; Alcohol Use: Rarely; Drug Use: No History; Caffeine Use: Daily Electronic Signature(s) Signed: 09/07/2022 3:50:04 PM By: Carlene Coria RN Signed: 09/07/2022 4:46:02 PM By: Robin Keeler PA-C Entered By: Carlene Coria on 09/07/2022 09:18:55 -------------------------------------------------------------------------------- SuperBill Details Patient Name: Date of Service: [name] 09/07/2022 Medical Record Number: [id number] Patient Account Number: [id number] Date of Birth/Sex:  Treating RN: 01-02-1946 (77 y.o. [name] Primary Care Provider: Frazier [name] Other Clinician: Referring Provider: Treating Provider/Extender: [name] in Treatment: 0 Diagnosis Coding ICD-10 Codes Code Description [phone] Chronic venous hypertension (idiopathic) with ulcer and inflammation of right lower extremity I89.0 Lymphedema, not elsewhere classified L97.822 Non-pressure chronic ulcer of other part of left lower leg with fat layer exposed N18.30 Chronic kidney disease, stage 3 unspecified I50.42 Chronic combined systolic (congestive) and diastolic (congestive) heart failure Z87.820 Personal history of traumatic brain injury Facility Procedures : CPT4 Code: [id number] Description: [id number] - WOUND CARE VISIT-LEV 2 EST PT Modifier: Quantity: 1 Physician Procedures : CPT4 Code Description Modifier [id number] - WC PHYS LEVEL 3 - EST PT ICD-10 Diagnosis Description I87.331 Chronic venous hypertension (idiopathic) with ulcer and inflammation of right lower extremity I89.0 Lymphedema, not elsewhere classified  L97.822 Non-pressure chronic ulcer of other part of left lower leg with fat  layer exposed N18.30 Chronic kidney disease, stage 3 unspecified Quantity: 1 Electronic Signature(s) Signed: 09/09/2022 9:58:25 AM By: Robin Keeler PA-C Previous Signature: 09/07/2022 3:50:04 PM Version By: Carlene Coria RN Previous Signature: 09/07/2022 4:46:02 PM Version By: Robin Keeler PA-C Entered By: [name] on 09/09/2022 09:58:25

## 2022-10-01 ENCOUNTER — Ambulatory Visit
Payer: Medicare Other | Attending: Pain Medicine | Admitting: Student in an Organized Health Care Education/Training Program

## 2022-10-01 ENCOUNTER — Encounter: Payer: Self-pay | Admitting: Student in an Organized Health Care Education/Training Program

## 2022-10-01 VITALS — BP 139/64 | HR 71 | Temp 97.7°F | Resp 18 | Ht 66.0 in | Wt 238.0 lb

## 2022-10-01 DIAGNOSIS — G894 Chronic pain syndrome: Secondary | ICD-10-CM | POA: Diagnosis not present

## 2022-10-01 DIAGNOSIS — M47816 Spondylosis without myelopathy or radiculopathy, lumbar region: Secondary | ICD-10-CM | POA: Diagnosis not present

## 2022-10-01 DIAGNOSIS — M5136 Other intervertebral disc degeneration, lumbar region: Secondary | ICD-10-CM | POA: Diagnosis not present

## 2022-10-01 MED ORDER — TRAMADOL HCL 50 MG PO TABS: 50.0000 mg | ORAL_TABLET | Freq: Two times a day (BID) | ORAL | 2 refills | Status: DC | PRN

## 2022-10-01 NOTE — Patient Instructions (Signed)
Radiofrequency Ablation Radiofrequency ablation is a procedure that is performed to relieve pain. The procedure is often used for back, neck, or arm pain. Radiofrequency ablation involves the use of a machine that creates radio waves to make heat. During the procedure, the heat is applied to the nerve that carries the pain signal. The heat damages the nerve and interferes with the pain signal. Pain relief usually starts about 2 weeks after the procedure and lasts for 6 months to 1 year. Tell a health care provider about: Any allergies you have. All medicines you are taking, including vitamins, herbs, eye drops, creams, and over-the-counter medicines. Any problems you or family members have had with anesthetic medicines. Any bleeding problems you have. Any surgeries you have had. Any medical conditions you have. Whether you are pregnant or may be pregnant. What are the risks? Generally, this is a safe procedure. However, problems may occur, including: Pain or soreness at the injection site. Allergic reaction to medicines given during the procedure. Bleeding. Infection at the injection site. Damage to nerves or blood vessels. What happens before the procedure? When to stop eating and drinking Follow instructions from your health care provider about what you may eat and drink before your procedure. These may include: 8 hours before the procedure Stop eating most foods. Do not eat meat, fried foods, or fatty foods. Eat only light foods, such as toast or crackers. All liquids are okay except energy drinks and alcohol. 6 hours before the procedure Stop eating. Drink only clear liquids, such as water, clear fruit juice, black coffee, plain tea, and sports drinks. Do not drink energy drinks or alcohol. 2 hours before the procedure Stop drinking all liquids. You may be allowed to take medicine with small sips of water. If you do not follow your health care provider's instructions, your  procedure may be delayed or canceled. Medicines Ask your health care provider about: Changing or stopping your regular medicines. This is especially important if you are taking diabetes medicines or blood thinners. Taking medicines such as aspirin and ibuprofen. These medicines can thin your blood. Do not take these medicines unless your health care provider tells you to take them. Taking over-the-counter medicines, vitamins, herbs, and supplements. General instructions Ask your health care provider what steps will be taken to help prevent infection. These steps may include: Removing hair at the procedure site. Washing skin with a germ-killing soap. Taking antibiotic medicine. If you will be going home right after the procedure, plan to have a responsible adult: Take you home from the hospital or clinic. You will not be allowed to drive. Care for you for the time you are told. What happens during the procedure?  You will be awake during the procedure. You will need to be able to talk with the health care provider during the procedure. An IV will be inserted into one of your veins. You will be given one or more of the following: A medicine to help you relax (sedative). A medicine to numb the area (local anesthetic). Your health care provider will insert a radiofrequency needle into the area to be treated. This is done with the help of fluoroscopy. A wire that carries the radio waves (electrode) will be put through the radiofrequency needle. An electrical pulse will be sent through the electrode to verify the correct nerve that is causing your pain. You will feel a tingling sensation, and you may have muscle twitching. The tissue around the needle tip will be heated by an   electric current that comes from the radiofrequency machine. This will numb the nerves. The needle will be removed. A bandage (dressing) will be put on the insertion area. The procedure may vary among health care providers  and hospitals. What happens after the procedure? Your blood pressure, heart rate, breathing rate, and blood oxygen level will be monitored until you leave the hospital or clinic. Return to your normal activities as told by your health care provider. Ask your health care provider what activities are safe for you. If you were given a sedative during the procedure, it can affect you for several hours. Do not drive or operate machinery until your health care provider says that it is safe. Summary Radiofrequency ablation is a procedure that is performed to relieve pain. The procedure is often used for back, neck, or arm pain. Radiofrequency ablation involves the use of a machine that creates radio waves to make heat. Plan to have a responsible adult take you home from the hospital or clinic. Do not drive or operate machinery until your health care provider says that it is safe. Return to your normal activities as told by your health care provider. Ask your health care provider what activities are safe for you. This information is not intended to replace advice given to you by your health care provider. Make sure you discuss any questions you have with your health care provider. Document Revised: 11/19/2020 Document Reviewed: 11/19/2020 Elsevier Patient Education  2023 [employer]  

## 2022-10-01 NOTE — Progress Notes (Signed)
Safety precautions to be maintained throughout the outpatient stay will include: orient to surroundings, keep bed in low position, maintain call bell within reach at all times, provide assistance with transfer out of bed and ambulation.   Nursing Pain Medication Assessment:  Safety precautions to be maintained throughout the outpatient stay will include: orient to surroundings, keep bed in low position, maintain call bell within reach at all times, provide assistance with transfer out of bed and ambulation.  Medication Inspection Compliance: Pill count conducted under aseptic conditions, in front of the patient. Neither the pills nor the bottle was removed from the patient's sight at any time. Once count was completed pills were immediately returned to the patient in their original bottle.  Medication: Tramadol (Ultram) Pill/Patch Count:  75 of 90 pills remain Pill/Patch Appearance: Markings consistent with prescribed medication Bottle Appearance: Standard pharmacy container. Clearly labeled. Filled Date: 04 / 09 / 2024 Last Medication intake:  Today

## 2022-10-01 NOTE — Progress Notes (Signed)
PROVIDER NOTE: Information contained herein reflects review and annotations entered in association with encounter. Interpretation of such information and data should be left to medically-trained personnel. Information provided to patient can be located elsewhere in the medical record under "Patient Instructions". Document created using STT-dictation technology, any transcriptional errors that may result from process are unintentional.    Patient: [name]  Service Category: E/M  Provider: Edward Jolly, MD  DOB: 1946-05-28  DOS: 10/01/2022  Specialty: Interventional Pain Management  MRN: [id number]  Setting: Ambulatory outpatient  PCP: Lauro Regulus, MD  Type: Established Patient    Referring Provider: Lauro Regulus, MD  Location: Office  Delivery: Face-to-face     HPI  [name], a 77 y.o. year old female, is here today because of her Lumbar facet arthropathy [M47.816]. [name]'s primary complain today is Back Pain  Last encounter: My last encounter with her was on 07/14/22 Pertinent problems: [name] has Lumbar spondylosis; Lumbar facet arthropathy; Lumbar degenerative disc disease; Spasm of lumbar paraspinous muscle; and Chronic pain syndrome on their pertinent problem list. Pain Assessment: Severity of Chronic pain is reported as a 7 /10. Location: Back Lower, Left/Radiates from lower back into left hip down left leg causing weakness. Onset: More than a month ago. Quality: Constant, Other (Comment), Dull, Aching (weakness). Timing: Constant. Modifying factor(s): Tramadol helps some and procedure. Vitals:  height is  (1.676 m) and weight is 238 lb (108 kg). Her temporal temperature is 97.7 F (36.5 C). Her blood pressure is 139/64 and her pulse is 71. Her respiration is 18 and oxygen saturation is 100%.   Reason for encounter: worsening of previously known (established) problem  She would like to discuss  repeating L-RFA as well  She is having increased lower back pain secondary to lumbar spondylosis and facet arthropathy.  Previous lumbar radiofrequency ablations were done on 02/23/22 for the left  side and 01/28/22 for the right side which provided 75% pain relief  for approx 6.5 months and improvement in ADLS until recently.     Pharmacotherapy Assessment   Analgesic: Tramadol 50 to 100 mg twice daily as needed.  Monitoring: [place] PMP: PDMP reviewed during this encounter.       Pharmacotherapy: No side-effects or adverse reactions reported. Compliance: No problems identified. Effectiveness: Clinically acceptable.  Earlyne Iba, RN  10/01/2022 10:05 AM  Sign when Signing Visit Safety precautions to be maintained throughout the outpatient stay will include: orient to surroundings, keep bed in low position, maintain call bell within reach at all times, provide assistance with transfer out of bed and ambulation.   Nursing Pain Medication Assessment:  Safety precautions to be maintained throughout the outpatient stay will include: orient to surroundings, keep bed in low position, maintain call bell within reach at all times, provide assistance with transfer out of bed and ambulation.  Medication Inspection Compliance: Pill count conducted under aseptic conditions, in front of the patient. Neither the pills nor the bottle was removed from the patient's sight at any time. Once count was completed pills were immediately returned to the patient in their original bottle.  Medication: Tramadol (Ultram) Pill/Patch Count:  75 of 90 pills remain Pill/Patch Appearance: Markings consistent with prescribed medication Bottle Appearance: Standard pharmacy container. Clearly labeled. Filled Date: 04 / 09 / 2024 Last Medication intake:  Today     UDS:  Summary  Date Value Ref Range Status  08/01/2020 Note  Final    Comment:    ====================================================================  [employer]  Select 13 (MW) ==================================================================== Test                             Result       Flag       Units  Drug Present and Declared for Prescription Verification   Tapentadol                     5398         EXPECTED   ng/mg creat    Source of tapentadol is a scheduled prescription medication.  ==================================================================== Test                      Result    Flag   Units      Ref Range   Creatinine              158              mg/dL      >=16 ==================================================================== Declared Medications:  The flagging and interpretation on this report are based on the  following declared medications.  Unexpected results may arise from  inaccuracies in the declared medications.   **Note: The testing scope of this panel includes these medications:   Tapentadol (Nucynta)   **Note: The testing scope of this panel does not include the  following reported medications:   Acetaminophen (Tylenol)  Albuterol  Amantadine  Amlodipine (Norvasc)  Aripiprazole (Abilify)  Aspirin  Azelastine (Astelin)  Baclofen (Lioresal)  Biotin  Carvedilol (Coreg)  Celecoxib (Celebrex)  Donepezil (Aricept)  Esomeprazole (Nexium)  Fluticasone (Breo)  Fluticasone (Flonase)  Folic Acid  Guaifenesin (Robitussin)  Levothyroxine (Synthroid)  Losartan (Cozaar)  Magnesium  Melatonin  Montelukast (Singulair)  Multivitamin  Nystatin (Mycostatin)  Potassium (Klor-Con)  Topiramate (Topamax)  Torsemide (Demadex)  Trazodone (Desyrel)  Undefined Miscellaneous Drug  Venlafaxine  Vilanterol (Breo)  Vitamin C  Vitamin D3  Zinc ==================================================================== For clinical consultation, please call [phone]. ====================================================================      ROS  Constitutional: Denies any fever or chills Gastrointestinal: No  reported hemesis, hematochezia, vomiting, or acute GI distress Musculoskeletal:  Low back pain related to lumbar facet arthropathy Neurological: No reported episodes of acute onset apraxia, aphasia, dysarthria, agnosia, amnesia, paralysis, loss of coordination, or loss of consciousness  Medication Review  ARIPiprazole, Biotin, OnabotulinumtoxinA, Semaglutide, Venlafaxine HCl, Vitamin D3, acetaminophen, albuterol, amLODipine, amantadine, azelastine, baclofen, carvedilol, celecoxib, donepezil, esomeprazole, fluticasone, fluticasone furoate-vilanterol, folic acid, levothyroxine, losartan, magnesium, melatonin, montelukast, multivitamin, nystatin, potassium chloride SA, topiramate, torsemide, traMADol, traZODone, vitamin C, and zinc gluconate  History Review  Allergy: [name] is allergic to codeine, povidone iodine, hydromorphone, latex, oxycodone-acetaminophen, sulfa antibiotics, bupropion, gabapentin, hydrocodone, morphine and related, and procaine. Drug: [name]  reports no history of drug use. Alcohol:  reports current alcohol use of about 2.0 standard drinks of alcohol per week. Tobacco:  reports that she has never smoked. She has never used smokeless tobacco. Social: [name]  reports that she has never smoked. She has never used smokeless tobacco. She reports current alcohol use of about 2.0 standard drinks of alcohol per week. She reports that she does not use drugs. Medical:  has a past medical history of Benign positional vertigo (03/04/2016), Brain injury, Climacteric, Depression, DJD (degenerative joint disease), Dyslipidemia, GERD (gastroesophageal reflux disease), Hypertension, Hypokalemia, Hypothyroidism, IBS (irritable bowel syndrome), OAB (overactive bladder), Ovarian cyst (03/25/2015), Rhinitis, Sleep apnea, and Spasm of  thoracolumbar muscle. Surgical: [name]  has a past surgical history that includes Ankle  reconstruction (Left); Brachioplasty; Cataract extraction; Cholecystectomy; Soft Tissue Tumor Resection; Abdominal hysterectomy (07/12/2014); Joint replacement (Bilateral, 07/2014, 03/2015); nasal reconstrucion; Toe Surgery (Right); Shoulder open rotator cuff repair; Colonoscopy with propofol (N/A, 12/07/2016); Reduction mammaplasty (Bilateral); and Breast reduction surgery (Bilateral, 2020). Family: family history includes Breast cancer in her paternal aunt; Lung cancer in her father and mother.  Laboratory Chemistry Profile   Renal Lab Results  Component Value Date   BUN 25 (H) 07/06/2020   CREATININE 0.99 07/06/2020   GFRNONAA 60 (L) 07/06/2020     Hepatic Lab Results  Component Value Date   AST 25 07/02/2020   ALT 21 07/02/2020   ALBUMIN 3.7 07/02/2020   ALKPHOS 80 07/02/2020     Electrolytes Lab Results  Component Value Date   NA 136 07/06/2020   K 4.1 07/06/2020   CL 98 07/06/2020   CALCIUM 9.0 07/06/2020   MG 2.0 07/06/2020   PHOS 3.0 07/06/2020     Bone No results found for: "VD25OH", "VD125OH2TOT", "ZO1096EA5", "WU9811BJ4", "25OHVITD1", "25OHVITD2", "25OHVITD3", "TESTOFREE", "TESTOSTERONE"   Inflammation (CRP: Acute Phase) (ESR: Chronic Phase) Lab Results  Component Value Date   LATICACIDVEN 1.1 07/02/2020       Note: Above Lab results reviewed.   Physical Exam  General appearance: Well nourished, well developed, and well hydrated. In no apparent acute distress Mental status: Alert, oriented x 3 (person, place, & time)       Respiratory: No evidence of acute respiratory distress Eyes: PERLA Vitals: BP 139/64   Pulse 71   Temp 97.7 F (36.5 C) (Temporal)   Resp 18   Ht  (1.676 m)   Wt 238 lb (108 kg)   SpO2 100%   BMI 38.41 kg/m  BMI: Estimated body mass index is 38.41 kg/m as calculated from the following:   Height as of this encounter:  (1.676 m).   Weight as of this encounter: 238 lb (108 kg). Ideal: Ideal body weight: 59.3 kg (130 lb  11.7 oz) Adjusted ideal body weight: 78.8 kg (173 lb 10.2 oz)  Positive low back pain related to lumbar facet arthropathy Pain with facet loading  right hip pain Antalgic gait, presents today in wheelchair  Assessment   Diagnosis  1. Lumbar facet arthropathy   2. Lumbar spondylosis   3. Lumbar degenerative disc disease   4. Chronic pain syndrome         Plan of Care   [name] has a current medication list which includes the following long-term medication(s): albuterol, amantadine, amlodipine, azelastine, carvedilol, donepezil, esomeprazole, levothyroxine, losartan, montelukast, potassium chloride sa, torsemide, trazodone, and venlafaxine hcl.   1.  Rx for tramado 50 to 100 mg twice daily as needed. 3.  Repeat lumbar radiofrequency ablation as below Requested Prescriptions   Signed Prescriptions Disp Refills   traMADol (ULTRAM) 50 MG tablet 90 tablet 2    Sig: Take 1-2 tablets (50-100 mg total) by mouth every 12 (twelve) hours as needed for moderate pain or severe pain.   Orders:  Orders Placed This Encounter  Procedures   Radiofrequency,Lumbar    Standing Status:   Future    Standing Expiration Date:   12/31/2022    Scheduling Instructions:     Side(s): Bilateral     Level(s): L3, L4, L5,  Medial Branch Nerve(s)     Sedation: PO Valium     Scheduling Timeframe: As  soon as Designer, industrial/product Question:   Where will this procedure be performed?    Answer:   [facility]   Follow-up plan:   Return in about 27 days (around 10/28/2022) for B/L L3, 4, 5 RFA , in clinic (PO Valium) schedule 40 mins.    Recent Visits Date Type Provider Dept  07/14/22 Office Visit Edward Jolly, MD [facility]  Showing recent visits within past 90 days and meeting all other requirements Today's Visits Date Type Provider Dept  10/01/22 Office Visit Edward Jolly, MD [facility]  Showing today's visits and meeting all other  requirements Future Appointments No visits were found meeting these conditions. Showing future appointments within next 90 days and meeting all other requirements  I discussed the assessment and treatment plan with the patient. The patient was provided an opportunity to ask questions and all were answered. The patient agreed with the plan and demonstrated an understanding of the instructions.  Patient advised to call back or seek an in-person evaluation if the symptoms or condition worsens.  Duration of encounter:30 minutes.  Note by: Edward Jolly, MD Date: 10/01/2022; Time: 10:49 AM

## 2022-10-07 ENCOUNTER — Encounter: Payer: Medicare Other | Admitting: Pain Medicine

## 2022-10-13 ENCOUNTER — Telehealth: Payer: Self-pay

## 2022-10-13 DIAGNOSIS — M47816 Spondylosis without myelopathy or radiculopathy, lumbar region: Secondary | ICD-10-CM

## 2022-10-13 NOTE — Telephone Encounter (Signed)
Medicare denied auth for RFA again. They do not do peer to peers. I called the patient and left her a vm letting her know

## 2022-10-19 ENCOUNTER — Telehealth: Payer: Self-pay | Admitting: Student in an Organized Health Care Education/Training Program

## 2022-10-19 NOTE — Telephone Encounter (Signed)
PT stated that she can't walk because of her pain. PT stated that both procedure has been denied and wants to see what else can be done to help with the pain. Please give patient a call.TY

## 2022-10-19 NOTE — Telephone Encounter (Signed)
Alona Bene has left patient messages regarding another procedure that was ordered and approved.  Awaiting the patient to return her call sot hat she can notify her.

## 2022-10-27 ENCOUNTER — Emergency Department: Payer: Medicare Other

## 2022-10-27 ENCOUNTER — Inpatient Hospital Stay
Admission: EM | Admit: 2022-10-27 | Discharge: 2022-11-02 | DRG: 683 | Disposition: A | Discharge status: Skilled Nursing Facility | Payer: Medicare Other | Attending: Internal Medicine | Admitting: Internal Medicine

## 2022-10-27 ENCOUNTER — Encounter: Payer: Self-pay | Admitting: Family Medicine

## 2022-10-27 ENCOUNTER — Other Ambulatory Visit: Payer: Self-pay

## 2022-10-27 DIAGNOSIS — F325 Major depressive disorder, single episode, in full remission: Secondary | ICD-10-CM | POA: Diagnosis present

## 2022-10-27 DIAGNOSIS — R531 Weakness: Secondary | ICD-10-CM

## 2022-10-27 DIAGNOSIS — E039 Hypothyroidism, unspecified: Secondary | ICD-10-CM | POA: Diagnosis present

## 2022-10-27 DIAGNOSIS — F0284 Dementia in other diseases classified elsewhere, unspecified severity, with anxiety: Secondary | ICD-10-CM | POA: Diagnosis present

## 2022-10-27 DIAGNOSIS — S069XAS Unspecified intracranial injury with loss of consciousness status unknown, sequela: Secondary | ICD-10-CM | POA: Diagnosis not present

## 2022-10-27 DIAGNOSIS — F068 Other specified mental disorders due to known physiological condition: Secondary | ICD-10-CM

## 2022-10-27 DIAGNOSIS — I83029 Varicose veins of left lower extremity with ulcer of unspecified site: Secondary | ICD-10-CM | POA: Diagnosis present

## 2022-10-27 DIAGNOSIS — I878 Other specified disorders of veins: Secondary | ICD-10-CM | POA: Insufficient documentation

## 2022-10-27 DIAGNOSIS — E860 Dehydration: Secondary | ICD-10-CM | POA: Diagnosis present

## 2022-10-27 DIAGNOSIS — Z885 Allergy status to narcotic agent status: Secondary | ICD-10-CM

## 2022-10-27 DIAGNOSIS — K2100 Gastro-esophageal reflux disease with esophagitis, without bleeding: Secondary | ICD-10-CM | POA: Diagnosis not present

## 2022-10-27 DIAGNOSIS — Z79890 Hormone replacement therapy (postmenopausal): Secondary | ICD-10-CM

## 2022-10-27 DIAGNOSIS — N179 Acute kidney failure, unspecified: Principal | ICD-10-CM | POA: Diagnosis present

## 2022-10-27 DIAGNOSIS — E876 Hypokalemia: Secondary | ICD-10-CM | POA: Diagnosis present

## 2022-10-27 DIAGNOSIS — N183 Chronic kidney disease, stage 3 unspecified: Secondary | ICD-10-CM | POA: Diagnosis not present

## 2022-10-27 DIAGNOSIS — I83009 Varicose veins of unspecified lower extremity with ulcer of unspecified site: Secondary | ICD-10-CM | POA: Diagnosis not present

## 2022-10-27 DIAGNOSIS — L97909 Non-pressure chronic ulcer of unspecified part of unspecified lower leg with unspecified severity: Secondary | ICD-10-CM | POA: Diagnosis not present

## 2022-10-27 DIAGNOSIS — Z79899 Other long term (current) drug therapy: Secondary | ICD-10-CM

## 2022-10-27 DIAGNOSIS — I5032 Chronic diastolic (congestive) heart failure: Secondary | ICD-10-CM | POA: Diagnosis present

## 2022-10-27 DIAGNOSIS — G4730 Sleep apnea, unspecified: Secondary | ICD-10-CM | POA: Diagnosis present

## 2022-10-27 DIAGNOSIS — S069X0S Unspecified intracranial injury without loss of consciousness, sequela: Secondary | ICD-10-CM | POA: Diagnosis not present

## 2022-10-27 DIAGNOSIS — I130 Hypertensive heart and chronic kidney disease with heart failure and stage 1 through stage 4 chronic kidney disease, or unspecified chronic kidney disease: Secondary | ICD-10-CM | POA: Diagnosis present

## 2022-10-27 DIAGNOSIS — Z882 Allergy status to sulfonamides status: Secondary | ICD-10-CM

## 2022-10-27 DIAGNOSIS — K589 Irritable bowel syndrome without diarrhea: Secondary | ICD-10-CM | POA: Diagnosis present

## 2022-10-27 DIAGNOSIS — E785 Hyperlipidemia, unspecified: Secondary | ICD-10-CM | POA: Diagnosis present

## 2022-10-27 DIAGNOSIS — Z7951 Long term (current) use of inhaled steroids: Secondary | ICD-10-CM

## 2022-10-27 DIAGNOSIS — N170 Acute kidney failure with tubular necrosis: Secondary | ICD-10-CM | POA: Diagnosis not present

## 2022-10-27 DIAGNOSIS — F05 Delirium due to known physiological condition: Secondary | ICD-10-CM | POA: Diagnosis not present

## 2022-10-27 DIAGNOSIS — I83019 Varicose veins of right lower extremity with ulcer of unspecified site: Secondary | ICD-10-CM | POA: Diagnosis present

## 2022-10-27 DIAGNOSIS — N3281 Overactive bladder: Secondary | ICD-10-CM | POA: Diagnosis present

## 2022-10-27 DIAGNOSIS — X58XXXS Exposure to other specified factors, sequela: Secondary | ICD-10-CM | POA: Diagnosis present

## 2022-10-27 DIAGNOSIS — Z884 Allergy status to anesthetic agent status: Secondary | ICD-10-CM

## 2022-10-27 DIAGNOSIS — G252 Other specified forms of tremor: Secondary | ICD-10-CM | POA: Diagnosis present

## 2022-10-27 DIAGNOSIS — K219 Gastro-esophageal reflux disease without esophagitis: Secondary | ICD-10-CM | POA: Diagnosis present

## 2022-10-27 DIAGNOSIS — I10 Essential (primary) hypertension: Secondary | ICD-10-CM | POA: Diagnosis not present

## 2022-10-27 DIAGNOSIS — N1832 Chronic kidney disease, stage 3b: Secondary | ICD-10-CM | POA: Diagnosis present

## 2022-10-27 DIAGNOSIS — Z90710 Acquired absence of both cervix and uterus: Secondary | ICD-10-CM | POA: Diagnosis not present

## 2022-10-27 DIAGNOSIS — R4189 Other symptoms and signs involving cognitive functions and awareness: Secondary | ICD-10-CM

## 2022-10-27 DIAGNOSIS — L89322 Pressure ulcer of left buttock, stage 2: Secondary | ICD-10-CM | POA: Diagnosis not present

## 2022-10-27 DIAGNOSIS — Z9049 Acquired absence of other specified parts of digestive tract: Secondary | ICD-10-CM

## 2022-10-27 DIAGNOSIS — L89312 Pressure ulcer of right buttock, stage 2: Secondary | ICD-10-CM | POA: Diagnosis not present

## 2022-10-27 DIAGNOSIS — Z91040 Latex allergy status: Secondary | ICD-10-CM

## 2022-10-27 DIAGNOSIS — Z888 Allergy status to other drugs, medicaments and biological substances status: Secondary | ICD-10-CM

## 2022-10-27 LAB — CBC
HCT: 38 % (ref 36.0–46.0)
Hemoglobin: 12.4 g/dL (ref 12.0–15.0)
MCH: 31.8 pg (ref 26.0–34.0)
MCHC: 32.6 g/dL (ref 30.0–36.0)
MCV: 97.4 fL (ref 80.0–100.0)
Platelets: 174 10*3/uL (ref 150–400)
RBC: 3.9 MIL/uL (ref 3.87–5.11)
RDW: 12.6 % (ref 11.5–15.5)
WBC: 6.7 10*3/uL (ref 4.0–10.5)
nRBC: 0 % (ref 0.0–0.2)

## 2022-10-27 LAB — BASIC METABOLIC PANEL
Anion gap: 17 — ABNORMAL HIGH (ref 5–15)
Anion gap: 13 (ref 5–15)
BUN: 105 mg/dL — ABNORMAL HIGH (ref 8–23)
BUN: 99 mg/dL — ABNORMAL HIGH (ref 8–23)
CO2: 21 mmol/L — ABNORMAL LOW (ref 22–32)
CO2: 28 mmol/L (ref 22–32)
Calcium: 9.9 mg/dL (ref 8.9–10.3)
Calcium: 10 mg/dL (ref 8.9–10.3)
Chloride: 96 mmol/L — ABNORMAL LOW (ref 98–111)
Chloride: 97 mmol/L — ABNORMAL LOW (ref 98–111)
Creatinine, Ser: 3.7 mg/dL — ABNORMAL HIGH (ref 0.44–1.00)
Creatinine, Ser: 3.07 mg/dL — ABNORMAL HIGH (ref 0.44–1.00)
GFR, Estimated: 12 mL/min — ABNORMAL LOW (ref >60)
GFR, Estimated: 15 mL/min — ABNORMAL LOW (ref >60)
Glucose, Bld: 97 mg/dL (ref 70–99)
Glucose, Bld: 123 mg/dL — ABNORMAL HIGH (ref 70–99)
Potassium: 3.3 mmol/L — ABNORMAL LOW (ref 3.5–5.1)
Potassium: 3 mmol/L — ABNORMAL LOW (ref 3.5–5.1)
Sodium: 134 mmol/L — ABNORMAL LOW (ref 135–145)
Sodium: 138 mmol/L (ref 135–145)

## 2022-10-27 LAB — MAGNESIUM: Magnesium: 2.5 mg/dL — ABNORMAL HIGH (ref 1.7–2.4)

## 2022-10-27 MED ORDER — ONDANSETRON HCL 4 MG/2ML IJ SOLN: 4.0000 mg | Freq: Four times a day (QID) | INTRAMUSCULAR | Status: DC | PRN

## 2022-10-27 MED ORDER — SODIUM CHLORIDE 0.9 % IV SOLN: Freq: Once | INTRAVENOUS | Status: AC

## 2022-10-27 MED ORDER — ENOXAPARIN SODIUM 30 MG/0.3ML IJ SOSY
30.0000 mg | PREFILLED_SYRINGE | INTRAMUSCULAR | Status: DC
  Administered 2022-10-27 – 2022-10-29 (×3): 30 mg via SUBCUTANEOUS
  Filled 2022-10-27 (×3): qty 0.3

## 2022-10-27 MED ORDER — ONDANSETRON HCL 4 MG PO TABS: 4.0000 mg | ORAL_TABLET | Freq: Four times a day (QID) | ORAL | Status: DC | PRN

## 2022-10-27 MED ORDER — POTASSIUM CHLORIDE IN NACL 20-0.9 MEQ/L-% IV SOLN
INTRAVENOUS | Status: DC
  Filled 2022-10-27 (×5): qty 1000

## 2022-10-27 NOTE — Assessment & Plan Note (Signed)
Continue Synthroid °

## 2022-10-27 NOTE — Assessment & Plan Note (Signed)
PPI ?

## 2022-10-27 NOTE — Assessment & Plan Note (Signed)
Patient ports generalized weakness and paresthesias over the last 1 to 2 days Noted concurrent acute on chronic kidney injury with suspected overdiuresis Grossly nonfocal exam apart from generalized tremors CT head within normal limits Suspect weakness likely multifactorial with contribution of dehydration Will check MRI of the brain x 1 to rule out any type of ischemic/vascular pathology intracranial process Gently hydrate PT OT evaluation Follow

## 2022-10-27 NOTE — ED Provider Notes (Signed)
[facility] Provider Note    Event Date/Time   First MD Initiated Contact with Patient 10/27/22 1337     (approximate)   History   Weakness   HPI  Akaisha Henckel Hollifield [name] is a 77 y.o. female striae of chronic CHF, bilateral leg edema, CKD who presents with complaints of weakness.  Patient reports that she was unable to get out of bed this morning, because she felt diffusely weak and somewhat lightheaded.  She denies chest pain.  No palpitations.  No nausea or vomiting.  No abdominal pain.     Physical Exam   Triage Vital Signs: ED Triage Vitals  Enc Vitals Group     BP 10/27/22 1205 (!) 119/45     Pulse Rate 10/27/22 1205 71     Resp 10/27/22 1205 20     Temp 10/27/22 1205 98.5 F (36.9 C)     Temp src --      SpO2 10/27/22 1205 94 %     Weight 10/27/22 1204 90.7 kg (200 lb)     Height --      Head Circumference --      Peak Flow --      Pain Score 10/27/22 1202 0     Pain Loc --      Pain Edu? --      Excl. in GC? --     Most recent vital signs: Vitals:   10/27/22 1205  BP: (!) 119/45  Pulse: 71  Resp: 20  Temp: 98.5 F (36.9 C)  SpO2: 94%     General: Awake, no distress.  CV:  Good peripheral perfusion.  Resp:  Normal effort.  Clear to auscultation bilaterally Abd:  No distention.  Soft, nontender Other:  Lower extremities wrapped, no significant edema   ED Results / Procedures / Treatments   Labs (all labs ordered are listed, but only abnormal results are displayed) Labs Reviewed  BASIC METABOLIC PANEL - Abnormal; Notable for the following components:      Result Value   Sodium 134 (*)    Potassium 3.3 (*)    Chloride 96 (*)    CO2 21 (*)    BUN 105 (*)    Creatinine, Ser 3.70 (*)    GFR, Estimated 12 (*)    Anion gap 17 (*)    All other components within normal limits  CBC     EKG  ED ECG REPORT I, Jene Every, the attending physician, personally viewed and interpreted this ECG.  Date:  10/27/2022  Rhythm: normal sinus rhythm QRS Axis: normal Intervals: normal ST/T Wave abnormalities: normal Narrative Interpretation: no evidence of acute ischemia    RADIOLOGY Chest x-ray viewed interpreted by me, no acute abnormality    PROCEDURES:  Critical Care performed:   Procedures   MEDICATIONS ORDERED IN ED: Medications  0.9 %  sodium chloride infusion ( Intravenous New Bag/Given 10/27/22 1404)     IMPRESSION / MDM / ASSESSMENT AND PLAN / ED COURSE  I reviewed the triage vital signs and the nursing notes. Patient's presentation is most consistent with acute presentation with potential threat to life or bodily function.  Patient presents with diffuse weakness as detailed above, differential is extensive includes electrolyte abnormality, infection, pneumonia, UTI, dehydration  Lab work reviewed and demonstrates significantly elevated BUN/creatinine, patient's baseline creatinine is 1.5-2.1, today her creatinine is 3.7 consistent with an acute on chronic kidney injury, her BUN is 105 most consistent with significant dehydration.  Chest  x-ray CT scan and EKG are all reassuring.  IV fluids infusing.  I discussed with the hospitalist for admission        FINAL CLINICAL IMPRESSION(S) / ED DIAGNOSES   Final diagnoses:  Dehydration  Generalized weakness  Acute kidney injury (HCC)     Rx / DC Orders   ED Discharge Orders     None        Note:  This document was prepared using Dragon voice recognition software and may include unintentional dictation errors.   Jene Every, MD 10/27/22 1450

## 2022-10-27 NOTE — Assessment & Plan Note (Signed)
Noted chronic venous stasis with recent completion of course of doxycycline for secondary infection Unna boots in place Monitor for now Wound care consult as clinically indicated

## 2022-10-27 NOTE — Assessment & Plan Note (Signed)
BP stable Titrate home regimen 

## 2022-10-27 NOTE — H&P (Signed)
History and Physical    Patient: [name] ZOX:[id number] DOB: 05-Nov-1945 DOA: 10/27/2022 DOS: the patient was seen and examined on 10/27/2022 PCP: Lauro Regulus, MD  Patient coming from: Home  Chief Complaint:  Chief Complaint  Patient presents with   Weakness   HPI: [name] is a 77 y.o. female with medical history significant of multiple medical issues including chronic diastolic heart failure, stage III CKD, GERD, hypertension, hypothyroidism, depression, cognitive impairment status post brain injury presenting with weakness and acute on chronic kidney disease.  Patient reports generalized weakness as well as paresthesias and numbness in lower extremities over the past 1 to 2 days.  No focal hemiparesis or confusion.  No vision changes or headache.  Positive fatigue.  No chest pain or shortness of breath.  No abdominal pain.  Patient noted to have been evaluated in the outpatient setting relatively week ago by PCP for acute on chronic lower extremity edema in the setting of venous stasis changes.  Had a course of additional Zaroxolyn as well as doxycycline.  Patient reports compliance with course of medication.  No reports of falls or head trauma. Presented to the ER afebrile, blood pressure 110s over 40s satting well room air.  White count 6.7, hemoglobin 12.4, platelets 174, creatinine 3.7, potassium 3.3, baseline creatinine appears to be 1-1.5.  Chest x-ray grossly stable. Review of Systems: As mentioned in the history of present illness. All other systems reviewed and are negative. Past Medical History:  Diagnosis Date   Benign positional vertigo 03/04/2016   Brain injury (HCC)    Climacteric    Depression    DJD (degenerative joint disease)    lumbar and cervical   Dyslipidemia    GERD (gastroesophageal reflux disease)    Hypertension    Hypokalemia    Hypothyroidism    IBS (irritable bowel syndrome)    OAB (overactive bladder)     Ovarian cyst 03/25/2015   3cm simple cyst on right ovary   Rhinitis    Sleep apnea    Spasm of thoracolumbar muscle    Past Surgical History:  Procedure Laterality Date   ABDOMINAL HYSTERECTOMY  07/12/2014   ANKLE RECONSTRUCTION Left    BRACHIOPLASTY     BREAST REDUCTION SURGERY Bilateral 2020   CATARACT EXTRACTION     CHOLECYSTECTOMY     COLONOSCOPY WITH PROPOFOL N/A 12/07/2016   Procedure: COLONOSCOPY WITH PROPOFOL;  Surgeon: Scot Jun, MD;  Location: [facility] ENDOSCOPY;  Service: Endoscopy;  Laterality: N/A;   JOINT REPLACEMENT Bilateral 07/2014, 03/2015   nasal reconstrucion     REDUCTION MAMMAPLASTY Bilateral    SHOULDER OPEN ROTATOR CUFF REPAIR     SOFT TISSUE TUMOR RESECTION     excision tumor soft tissue neck, and soft tissue subcutaneous axilla   TOE SURGERY Right    reconstruction right foot 4th and 5th toe   Social History:  reports that she has never smoked. She has never used smokeless tobacco. She reports current alcohol use of about 2.0 standard drinks of alcohol per week. She reports that she does not use drugs.  Allergies  Allergen Reactions   Codeine Other (See Comments)    Seizure. Tolerates tramadol   Povidone Iodine Shortness Of Breath    Patient had difficulty breathing during procedure where dura prep was used to clean back.    Hydromorphone Hives, Itching and Other (See Comments)    Severe itching hallucinations   Latex Itching and Other (See Comments)  blisters   Oxycodone-Acetaminophen Nausea And Vomiting and Itching   Sulfa Antibiotics Itching and Swelling    Swelling eyes, hands, feet. Was at home and didn't go to the hospital   Bupropion Other (See Comments)   Gabapentin Other (See Comments)    tremors   Hydrocodone     Tolerates tramadol   Morphine And Related Itching and Rash   Procaine     Family History  Problem Relation Age of Onset   Lung cancer Mother    Lung cancer Father    Breast cancer Paternal Aunt     Prior to  Admission medications   Medication Sig Start Date End Date Taking? Authorizing Provider  acetaminophen (TYLENOL) 500 MG tablet Take 500 mg by mouth every 6 (six) hours as needed.   Yes [provider]  albuterol (ACCUNEB) 0.63 MG/3ML nebulizer solution albuterol sulfate HFA 90 mcg/actuation aerosol inhaler  INHALE 2 INHALATIONS INTO THE LUNGS EVERY 6 HOURS AS NEEDED FOR WHEEZING   Yes [provider]  amantadine (SYMMETREL) 100 MG capsule Take 100 mg by mouth 2 (two) times daily.   Yes [provider]  amLODipine (NORVASC) 5 MG tablet Take 5 mg by mouth daily.   Yes [provider]  carvedilol (COREG) 25 MG tablet Take 50 mg by mouth 2 (two) times daily with a meal.   Yes [provider]  donepezil (ARICEPT) 10 MG tablet Take 10 mg by mouth at bedtime.   Yes [provider]  fluticasone (FLONASE) 50 MCG/ACT nasal spray Place 1 spray into both nostrils daily as needed for rhinitis or allergies.   Yes [provider]  fluticasone furoate-vilanterol (BREO ELLIPTA) 100-25 MCG/INH AEPB Inhale 1 puff into the lungs daily. Patient taking differently: Inhale 1 puff into the lungs daily as needed. 07/07/20  Yes Gillis Santa, MD  folic acid (FOLVITE) 400 MCG tablet Take 400 mcg by mouth daily.   Yes [provider]  losartan (COZAAR) 100 MG tablet Take 100 mg by mouth daily.   Yes [provider]  magnesium 30 MG tablet Take 30 mg by mouth daily.   Yes [provider]  Melatonin 3 MG TABS Take 1 tablet at bedtime by mouth.    Yes [provider]  metolazone (ZAROXOLYN) 2.5 MG tablet Take 2.5 mg by mouth every other day.   Yes [provider]  montelukast (SINGULAIR) 10 MG tablet Take 10 mg by mouth every evening.   Yes [provider]  Multiple Vitamin (MULTIVITAMIN) tablet Take 1 tablet by mouth daily.   Yes [provider]  nystatin (MYCOSTATIN/NYSTOP) powder Apply topically 2  (two) times daily. Patient taking differently: Apply 1 Application topically 2 (two) times daily as needed. 07/06/20  Yes Gillis Santa, MD  potassium chloride SA (K-DUR,KLOR-CON) 20 MEQ tablet Take 20 mEq by mouth 3 (three) times daily.   Yes [provider]  spironolactone (ALDACTONE) 25 MG tablet Take 25 mg by mouth daily. 10/15/22  Yes [provider]  topiramate (TOPAMAX) 25 MG tablet Take 25 mg by mouth daily.   Yes [provider]  torsemide (DEMADEX) 20 MG tablet Take 20 mg by mouth daily. States she is taking 60mg    Yes [provider]  traMADol (ULTRAM) 50 MG tablet Take 1-2 tablets (50-100 mg total) by mouth every 12 (twelve) hours as needed for moderate pain or severe pain. 11/14/22 02/12/23 Yes Edward Jolly, MD  traZODone (DESYREL) 50 MG tablet Take 50 mg by mouth  at bedtime. 50-150mg  nightly as needed   Yes [provider]  venlafaxine XR (EFFEXOR-XR) 75 MG 24 hr capsule Take 225 mg by mouth daily. 10/17/22  Yes [provider]  zinc gluconate 50 MG tablet Take 50 mg by mouth daily.   Yes [provider]  Ascorbic Acid (VITAMIN C) 1000 MG tablet Take 1,000 mg daily by mouth.    [provider]  baclofen (LIORESAL) 20 MG tablet Take 20 mg by mouth daily as needed for muscle spasms.    [provider]  Biotin 10 MG CAPS Take by mouth daily.    [provider]  Cholecalciferol (VITAMIN D3) 2000 units TABS Take 2,000 Units daily by mouth.    [provider]  esomeprazole (NEXIUM) 40 MG capsule Take 40 mg by mouth daily.    [provider]  levothyroxine (SYNTHROID, LEVOTHROID) 112 MCG tablet Take 112 mcg by mouth daily.    [provider]    Physical Exam: Vitals:   10/27/22 1204 10/27/22 1205  BP:  (!) 119/45  Pulse:  71  Resp:  20  Temp:  98.5 F (36.9 C)  SpO2:  94%  Weight: 90.7 kg    Physical Exam Constitutional:      Appearance: She is obese.  HENT:     Head:  Normocephalic and atraumatic.     Nose: Nose normal.     Mouth/Throat:     Mouth: Mucous membranes are moist.  Eyes:     Pupils: Pupils are equal, round, and reactive to light.  Cardiovascular:     Rate and Rhythm: Normal rate and regular rhythm.  Pulmonary:     Effort: Pulmonary effort is normal.  Abdominal:     General: Abdomen is flat. Bowel sounds are normal.  Musculoskeletal:     Comments: + generalized weakness  + bilateral unna boots in place  Neurological:     Comments: Slowed mentation  Mild generalized tremor  Otherwise grossly stable neuro exam   Psychiatric:        Mood and Affect: Mood normal.     Data Reviewed:  There are no new results to review at this time. DG Chest 2 View CLINICAL DATA:  Weakness  EXAM: CHEST - 2 VIEW  COMPARISON:  CXR 07/02/20  FINDINGS: The heart size and mediastinal contours are within normal limits. Both lungs are clear.  Degenerative changes of the bilateral glenohumeral joints. Vertebral body heights are maintained. No radiographically apparent displaced rib fracture.  IMPRESSION: No focal airspace opacity.  Electronically Signed   By: Lorenza Cambridge M.D.   On: 10/27/2022 13:57 CT HEAD WO CONTRAST ( ) CLINICAL DATA:  Stroke suspected  EXAM: CT HEAD WITHOUT CONTRAST  TECHNIQUE: Contiguous axial images were obtained from the base of the skull through the vertex without intravenous contrast.  RADIATION DOSE REDUCTION: This exam was performed according to the departmental dose-optimization program which includes automated exposure control, adjustment of the mA and/or kV according to patient size and/or use of iterative reconstruction technique.  COMPARISON:  None Available.  FINDINGS: Brain: No evidence of acute infarction, hemorrhage, hydrocephalus, extra-axial collection or mass lesion/mass effect. Enlarged and partially empty sella.  Vascular: No hyperdense vessel or unexpected calcification.  Skull:  Normal. Negative for fracture or focal lesion.  Sinuses/Orbits: No middle ear or mastoid effusion. Polypoid mucosal thickening bilateral sphenoid sinuses. Bilateral lens replacement. Orbits are otherwise unremarkable.  Other: None.  IMPRESSION: No hemorrhage or CT evidence of an acute cortical infarct  Electronically Signed   By: Lorenza Cambridge M.D.   On: 10/27/2022 12:53  Lab Results  Component Value Date   WBC 6.7 10/27/2022   HGB 12.4 10/27/2022   HCT 38.0 10/27/2022   MCV 97.4 10/27/2022   PLT 174 10/27/2022   Last metabolic panel Lab Results  Component Value Date   GLUCOSE 97 10/27/2022   NA 134 (L) 10/27/2022   K 3.3 (L) 10/27/2022   CL 96 (L) 10/27/2022   CO2 21 (L) 10/27/2022   BUN 105 (H) 10/27/2022   CREATININE 3.70 (H) 10/27/2022   GFRNONAA 12 (L) 10/27/2022   CALCIUM 9.9 10/27/2022   PHOS 3.0 07/06/2020   PROT 7.7 07/02/2020   ALBUMIN 3.7 07/02/2020   BILITOT 0.9 07/02/2020   ALKPHOS 80 07/02/2020   AST 25 07/02/2020   ALT 21 07/02/2020   ANIONGAP 17 (H) 10/27/2022    Assessment and Plan: * Acute-on-chronic kidney injury (HCC) Creatinine 3.7 today the setting of baseline creatinine between 1-1.5 Suspect secondary to overdiuresis with Lasix, spironolactone and Zaroxolyn Will gently hydrate patient Hold nephrotoxic agents FE UN Renal ultrasound Monitor Nephrology consult as clinically indicated  Weakness Patient ports generalized weakness and paresthesias over the last 1 to 2 days Noted concurrent acute on chronic kidney injury with suspected overdiuresis Grossly nonfocal exam apart from generalized tremors CT head within normal limits Suspect weakness likely multifactorial with contribution of dehydration Will check MRI of the brain x 1 to rule out any type of ischemic/vascular pathology intracranial process Gently hydrate PT OT evaluation Follow   Venous stasis Noted chronic venous stasis with recent completion of course of doxycycline  for secondary infection Unna boots in place Monitor for now Wound care consult as clinically indicated  Major depression in remission (HCC) Continue mood regimen including Effexor  HTN, goal below 140/90 BP stable Titrate home regimen  GERD (gastroesophageal reflux disease) PPI  Cognitive deficit as late effect of traumatic brain injury (HCC) Somewhat slowed mentation though appears to be at relative baseline  Chronic diastolic CHF (congestive heart failure) (HCC) Large body habitus, though appears to be clinically dry Will gently hydrate in the setting of acute on chronic kidney injury Monitor volume status closely Follow  Acquired hypothyroidism Continue Synthroid      Advance Care Planning:   Code Status: Full Code   Consults: None   Family Communication: No family at the bedside   Severity of Illness: The appropriate patient status for this patient is INPATIENT. Inpatient status is judged to be reasonable and necessary in order to provide the required intensity of service to ensure the patient's safety. The patient's presenting symptoms, physical exam findings, and initial radiographic and laboratory data in the context of their chronic comorbidities is felt to place them at high risk for further clinical deterioration. Furthermore, it is not anticipated that the patient will be medically stable for discharge from the hospital within 2 midnights of admission.   * I certify that at the point of admission it is my clinical judgment that the patient will require inpatient hospital care spanning beyond 2 midnights from the point of admission due to high intensity of service, high risk for further deterioration and high frequency of surveillance required.*  Author: Floydene Flock, MD 10/27/2022 3:03 PM  For on call review [email or web address].

## 2022-10-27 NOTE — Assessment & Plan Note (Signed)
Continue mood regimen including Effexor

## 2022-10-27 NOTE — Assessment & Plan Note (Signed)
Somewhat slowed mentation though appears to be at relative baseline

## 2022-10-27 NOTE — Assessment & Plan Note (Signed)
Creatinine 3.7 today the setting of baseline creatinine between 1-1.5 Suspect secondary to overdiuresis with Lasix, spironolactone and Zaroxolyn Will gently hydrate patient Hold nephrotoxic agents FE UN Renal ultrasound Monitor Nephrology consult as clinically indicated

## 2022-10-27 NOTE — Assessment & Plan Note (Signed)
Large body habitus, though appears to be clinically dry Will gently hydrate in the setting of acute on chronic kidney injury Monitor volume status closely Follow

## 2022-10-27 NOTE — ED Triage Notes (Signed)
Pt to ED [facility] from home for generalized weakness, generalized weakness this 0330 today. Ems reports pt 2 person assist today, was shaking when standing. Baseline uses w/c. Pt moving all extremities.  Currently on antibiotic for cellulitis in legs, bilateral legs wrapped.

## 2022-10-28 ENCOUNTER — Inpatient Hospital Stay: Payer: Medicare Other

## 2022-10-28 DIAGNOSIS — E039 Hypothyroidism, unspecified: Secondary | ICD-10-CM

## 2022-10-28 DIAGNOSIS — K2100 Gastro-esophageal reflux disease with esophagitis, without bleeding: Secondary | ICD-10-CM | POA: Diagnosis not present

## 2022-10-28 DIAGNOSIS — N170 Acute kidney failure with tubular necrosis: Secondary | ICD-10-CM | POA: Diagnosis not present

## 2022-10-28 LAB — COMPREHENSIVE METABOLIC PANEL
ALT: 13 U/L (ref 0–44)
AST: 14 U/L — ABNORMAL LOW (ref 15–41)
Albumin: 3.6 g/dL (ref 3.5–5.0)
Alkaline Phosphatase: 69 U/L (ref 38–126)
Anion gap: 11 (ref 5–15)
BUN: 88 mg/dL — ABNORMAL HIGH (ref 8–23)
CO2: 24 mmol/L (ref 22–32)
Calcium: 9.7 mg/dL (ref 8.9–10.3)
Chloride: 103 mmol/L (ref 98–111)
Creatinine, Ser: 2.25 mg/dL — ABNORMAL HIGH (ref 0.44–1.00)
GFR, Estimated: 22 mL/min — ABNORMAL LOW (ref >60)
Glucose, Bld: 91 mg/dL (ref 70–99)
Potassium: 3 mmol/L — ABNORMAL LOW (ref 3.5–5.1)
Sodium: 138 mmol/L (ref 135–145)
Total Bilirubin: 1.1 mg/dL (ref 0.3–1.2)
Total Protein: 6.5 g/dL (ref 6.5–8.1)

## 2022-10-28 LAB — CBC
HCT: 33 % — ABNORMAL LOW (ref 36.0–46.0)
Hemoglobin: 11.3 g/dL — ABNORMAL LOW (ref 12.0–15.0)
MCH: 31.7 pg (ref 26.0–34.0)
MCHC: 34.2 g/dL (ref 30.0–36.0)
MCV: 92.7 fL (ref 80.0–100.0)
Platelets: 248 10*3/uL (ref 150–400)
RBC: 3.56 MIL/uL — ABNORMAL LOW (ref 3.87–5.11)
RDW: 12.5 % (ref 11.5–15.5)
WBC: 8.1 10*3/uL (ref 4.0–10.5)
nRBC: 0 % (ref 0.0–0.2)

## 2022-10-28 LAB — GLUCOSE, CAPILLARY: Glucose-Capillary: 95 mg/dL (ref 70–99)

## 2022-10-28 LAB — SODIUM, URINE, RANDOM: Sodium, Ur: 71 mmol/L

## 2022-10-28 LAB — CREATININE, URINE, RANDOM: Creatinine, Urine: 44 mg/dL

## 2022-10-28 MED ORDER — AMLODIPINE BESYLATE 5 MG PO TABS
5.0000 mg | ORAL_TABLET | Freq: Every day | ORAL | Status: DC
  Administered 2022-10-28 – 2022-11-02 (×6): 5 mg via ORAL
  Filled 2022-10-28 (×6): qty 1

## 2022-10-28 MED ORDER — DONEPEZIL HCL 5 MG PO TABS
10.0000 mg | ORAL_TABLET | Freq: Every day | ORAL | Status: DC
  Administered 2022-10-28 – 2022-11-02 (×6): 10 mg via ORAL
  Filled 2022-10-28 (×6): qty 2

## 2022-10-28 MED ORDER — LEVOTHYROXINE SODIUM 112 MCG PO TABS
112.0000 ug | ORAL_TABLET | Freq: Every day | ORAL | Status: DC
  Administered 2022-10-29 – 2022-11-02 (×5): 112 ug via ORAL
  Filled 2022-10-28 (×5): qty 1

## 2022-10-28 MED ORDER — TRAZODONE HCL 50 MG PO TABS
50.0000 mg | ORAL_TABLET | Freq: Every day | ORAL | Status: DC
  Administered 2022-10-28 – 2022-11-01 (×5): 50 mg via ORAL
  Filled 2022-10-28 (×5): qty 1

## 2022-10-28 MED ORDER — AMANTADINE HCL 100 MG PO CAPS
100.0000 mg | ORAL_CAPSULE | Freq: Every day | ORAL | Status: DC
  Administered 2022-10-29 – 2022-11-02 (×5): 100 mg via ORAL
  Filled 2022-10-28 (×6): qty 1

## 2022-10-28 MED ORDER — SODIUM CHLORIDE 0.9 % IV SOLN: Freq: Once | INTRAVENOUS | Status: AC

## 2022-10-28 MED ORDER — POTASSIUM CHLORIDE CRYS ER 20 MEQ PO TBCR
20.0000 meq | EXTENDED_RELEASE_TABLET | Freq: Two times a day (BID) | ORAL | Status: AC
  Administered 2022-10-28 – 2022-10-29 (×4): 20 meq via ORAL
  Filled 2022-10-28 (×4): qty 1

## 2022-10-28 MED ORDER — ALBUTEROL SULFATE (2.5 MG/3ML) 0.083% IN NEBU: 1.2500 mg | INHALATION_SOLUTION | Freq: Four times a day (QID) | RESPIRATORY_TRACT | Status: DC | PRN

## 2022-10-28 MED ORDER — ACETAMINOPHEN 325 MG PO TABS
650.0000 mg | ORAL_TABLET | Freq: Four times a day (QID) | ORAL | Status: DC | PRN
  Administered 2022-10-28: 650 mg via ORAL
  Filled 2022-10-28: qty 2

## 2022-10-28 MED ORDER — FOLIC ACID 1 MG PO TABS
500.0000 ug | ORAL_TABLET | Freq: Every day | ORAL | Status: DC
  Administered 2022-10-28 – 2022-11-02 (×6): 0.5 mg via ORAL
  Filled 2022-10-28 (×6): qty 1

## 2022-10-28 MED ORDER — ADULT MULTIVITAMIN W/MINERALS CH
1.0000 | ORAL_TABLET | Freq: Every day | ORAL | Status: DC
  Administered 2022-10-28 – 2022-11-02 (×6): 1 via ORAL
  Filled 2022-10-28 (×6): qty 1

## 2022-10-28 MED ORDER — PANTOPRAZOLE SODIUM 40 MG PO TBEC
40.0000 mg | DELAYED_RELEASE_TABLET | Freq: Every day | ORAL | Status: DC
  Administered 2022-10-28 – 2022-11-02 (×6): 40 mg via ORAL
  Filled 2022-10-28 (×6): qty 1

## 2022-10-28 MED ORDER — MONTELUKAST SODIUM 10 MG PO TABS
10.0000 mg | ORAL_TABLET | Freq: Every evening | ORAL | Status: DC
  Administered 2022-10-28 – 2022-11-01 (×5): 10 mg via ORAL
  Filled 2022-10-28 (×5): qty 1

## 2022-10-28 MED ORDER — AMANTADINE HCL 100 MG PO CAPS
100.0000 mg | ORAL_CAPSULE | Freq: Two times a day (BID) | ORAL | Status: DC
  Administered 2022-10-28: 100 mg via ORAL
  Filled 2022-10-28: qty 1

## 2022-10-28 MED ORDER — MELATONIN 5 MG PO TABS
2.5000 mg | ORAL_TABLET | Freq: Every day | ORAL | Status: DC
  Administered 2022-10-28 – 2022-11-01 (×5): 2.5 mg via ORAL
  Filled 2022-10-28 (×5): qty 1

## 2022-10-28 MED ORDER — VENLAFAXINE HCL ER 75 MG PO CP24
225.0000 mg | ORAL_CAPSULE | Freq: Every day | ORAL | Status: DC
  Administered 2022-10-28 – 2022-11-02 (×6): 225 mg via ORAL
  Filled 2022-10-28 (×6): qty 1

## 2022-10-28 MED ORDER — TOPIRAMATE 25 MG PO TABS
25.0000 mg | ORAL_TABLET | Freq: Every day | ORAL | Status: DC
  Administered 2022-10-28 – 2022-11-02 (×6): 25 mg via ORAL
  Filled 2022-10-28 (×6): qty 1

## 2022-10-28 MED ORDER — CARVEDILOL 25 MG PO TABS
50.0000 mg | ORAL_TABLET | Freq: Two times a day (BID) | ORAL | Status: DC
  Administered 2022-10-28 – 2022-11-02 (×10): 50 mg via ORAL
  Filled 2022-10-28 (×10): qty 2

## 2022-10-28 NOTE — Progress Notes (Signed)
Occupational Therapy Evaluation Patient Details Name: ZIANA NEMBHARD [name] MRN: [id number] DOB: Mar 12, 1946 Today's Date: 10/28/2022   History of Present Illness Shaylah Lindau is a 77yoF who comes to [facility] on 10/27/22 c acute onset weakness. PMH: dCHF, CKD3, GERD, HTN, hypoTSH, depression, TBI c residual weakness.   Clinical Impression   Patient presenting with global weakness, hx of 2 falls in the past year.  Patient states mod I in ADLs, IADLs, including meds, money and driving PTA. Patient currently functioning below baseline, requiring significant mod physical assist to perform bed mobility and lateral scooting. Unable to stand. Anticipate pt would require +2 for functional transfers and ADLs modA for LB. Flat affect and often delays in responses, motivated throughout treatment but weakness limits participation without assist. Would benefit from acute OT services to address improving strength, activity tolerance, self-care and overall IND for daily living tasks.  Patient will benefit from acute OT to increase overall independence in the areas of ADLs, functional mobility, and self-care in order to safely discharge.       Recommendations for follow up therapy are one component of a multi-disciplinary discharge planning process, led by the attending physician.  Recommendations may be updated based on patient status, additional functional criteria and insurance authorization.   Assistance Recommended at Discharge Frequent or constant Supervision/Assistance  Patient can return home with the following Direct supervision/assist for medications management;Direct supervision/assist for financial management;Assist for transportation;Two people to help with walking and/or transfers;Two people to help with bathing/dressing/bathroom;Help with stairs or ramp for entrance    Functional Status Assessment  Patient has had a recent decline in their functional status and demonstrates the ability to make  significant improvements in function in a reasonable and predictable amount of time.  Equipment Recommendations  None recommended by OT    Recommendations for Other Services       Precautions / Restrictions Precautions Precautions: Fall Restrictions Weight Bearing Restrictions: No      Mobility Bed Mobility Overal bed mobility: Needs Assistance Bed Mobility: Supine to Sit, Sit to Supine     Supine to sit: Mod assist Sit to supine: Mod assist   General bed mobility comments: very labored efforts, not much progress made without assist. Rolling in bed with use of grab bars for pericare, slow to move progressing from supine to EOB. Lateral scooting attempted, but was unable to offset posterior without physical assist, generalized weakness using BUE/BLE to shift weight. Attempts to bridge at hips to scoot towards HOB.    Transfers                          Balance Overall balance assessment: Needs assistance Sitting-balance support: Feet supported Sitting balance-Leahy Scale: Fair Sitting balance - Comments: Sat EOB with overall CGA                                   ADL either performed or assessed with clinical judgement   ADL Overall ADL's : Needs assistance/impaired     Grooming: Wash/dry face;Sitting Grooming Details (indicate cue type and reason): Washes face EOB with CGA for sitting balance                               General ADL Comments: Anticipate pt would require minA for UB ADLs, maxA for LB ADLs and +2 for all  functional mobility. Pt with generalized weakness, able to reach BUE to touch head, attempts to reach behind back.     Vision Baseline Vision/History: 0 No visual deficits (Reads name badge and clock on wall WNL) Ability to See in Adequate Light: 0 Adequate              Pertinent Vitals/Pain Pain Assessment Pain Assessment: No/denies pain     Hand Dominance Right   Extremity/Trunk Assessment Upper  Extremity Assessment Upper Extremity Assessment: Generalized weakness   Lower Extremity Assessment Lower Extremity Assessment: Generalized weakness       Communication Communication Communication: No difficulties   Cognition Arousal/Alertness: Awake/alert Behavior During Therapy: WFL for tasks assessed/performed Overall Cognitive Status: Within Functional Limits for tasks assessed                                 General Comments: A&Ox4                Home Living Family/patient expects to be discharged to:: Private residence Living Arrangements: Spouse/significant other Available Help at Discharge: Personal care attendant Type of Home: House Home Access: Stairs to enter [employer] of Steps: 5 stairs, has a stairlift in garage Entrance Stairs-Rails: Left;Right;Can reach both Home Layout: One level     Bathroom Shower/Tub: Psychologist, counselling (grab bars)   Bathroom Toilet: Standard     Home Equipment: Agricultural consultant (2 wheels);Rollator (4 wheels);Hand held shower head;Grab bars - tub/shower;Shower seat;Adaptive equipment Adaptive Equipment: Reacher;Sock aid;Long-handled shoe horn Additional Comments: still works for town of [employer]      Prior Functioning/Environment Prior Level of Function : Driving             Mobility Comments: baseline balance issue, uses rollator for household AMB distances, uses a stairlift at entry, sleeps in a recliner, uses lift chair function to stand up          OT Problem List: Decreased strength;Decreased activity tolerance;Impaired balance (sitting and/or standing);Decreased range of motion;Impaired UE functional use;Obesity;Decreased cognition      OT Treatment/Interventions: Self-care/ADL training;Balance training;Patient/family education    OT Goals(Current goals can be found in the care plan section) Acute Rehab OT Goals Patient Stated Goal: To go home OT Goal Formulation: With patient Time For  Goal Achievement: 11/11/22 Potential to Achieve Goals: Fair ADL Goals Pt Will Perform Lower Body Dressing: with min assist;sitting/lateral leans Pt Will Transfer to Toilet: bedside commode;with mod assist;stand pivot transfer Pt Will Perform Tub/Shower Transfer: with mod assist;rolling walker;Stand pivot transfer  OT Frequency: Min 2X/week       AM-PAC OT "6 Clicks" Daily Activity     Outcome Measure Help from another person eating meals?: A Little Help from another person taking care of personal grooming?: A Little Help from another person toileting, which includes using toliet, bedpan, or urinal?: A Lot Help from another person bathing (including washing, rinsing, drying)?: A Lot Help from another person to put on and taking off regular upper body clothing?: A Lot Help from another person to put on and taking off regular lower body clothing?: A Lot 6 Click Score: 14   End of Session    Activity Tolerance: Patient limited by fatigue Patient left: in bed;with call bell/phone within reach;with bed alarm set  OT Visit Diagnosis: Muscle weakness (generalized) (M62.81);Other abnormalities of gait and mobility (R26.89);Unsteadiness on feet (R26.81)  Time: 4540-9811 OT Time Calculation (min): 24 min Charges:  OT General Charges $OT Visit: 1 Visit OT Evaluation $OT Eval Moderate Complexity: 1 Mod OT Treatments $Self Care/Home Management : 8-22 mins    Lise Auer Azad Calame 10/28/2022, 4:35 PM

## 2022-10-28 NOTE — Progress Notes (Signed)
PROGRESS NOTE    [name] [facility] [name]  ZOX:[id number] DOB: 1945-08-07 DOA: 10/27/2022 PCP: Lauro Regulus, MD    Brief Narrative:  77 year old with history of multiple medical issues including chronic diastolic heart failure, CKD stage IIIb, GERD, hypertension, hypothyroidism, depression, cognitive impairment brought to the ER with acute on chronic renal disease.  Patient does have venous ulcers of the lower extremities and followed at [facility] clinic wound care, she was also on multiple diuretics, she was given additional Zaroxolyn as well as doxycycline last week.  Weak, shaking with standing so called EMS.  In the emergency room hemodynamically stable.  Found to have significantly elevated creatinine.   Assessment & Plan:   Acute on chronic kidney disease stage IIIb, baseline creatinine around 1.5. This is likely due to poor oral intake, use of Lasix, spironolactone and Zaroxolyn in addition.  Patient already showing signs of improvement with gentle IV fluids. Continue IV fluids today.  Continue monitoring renal functions.  Urine output monitoring. She is using Unna boot, no significant leg edema now.  Will reduce dose of diuretics.  Weakness, deconditioning: Overall weak.  No focal deficit.  CT head normal limits.  This is multifactorial.  Work with PT OT.  May benefit with PT OT at home.  Bilateral leg venous stasis ulcers and swelling: Completed doxycycline therapy. Seen by wound care.  Unna boots placed.  Follow-up at [facility] clinic for next [employer]. She currently has compressed legs, will avoid further diuretics.  Hypokalemia: Replace and monitor levels.  Magnesium is adequate.  Essential hypertension: Blood pressure stable GERD: On PPI Chronic diastolic heart failure: Euvolemic.  Dehydrated.  Holding Aldactone and Lasix.  Monitor for fluid overload. Hypothyroidism: On Synthroid.  Continue.   DVT prophylaxis: enoxaparin (LOVENOX) injection 30 mg Start:  10/27/22 1600 SCDs Start: 10/27/22 1445   Code Status: Full code Family Communication: None at the bedside Disposition Plan: Status is: Inpatient Remains inpatient appropriate because: IV fluids, mobility safety pending.     Consultants:  None  Procedures:  None  Antimicrobials:  None   Subjective: Patient seen and examined.  Denies any complaints.  She tells me she has always difficulty to mobilize. Wound inspected with wound care nursing staff at the bedside, she has a small full-thickness wound left lateral leg with serosanguineous drainage.  Objective: Vitals:   10/27/22 1205 10/27/22 1840 10/27/22 2255 10/28/22 0824  BP: (!) 119/45 (!) 111/37 (!) 133/54 (!) 138/52  Pulse: 71 73 79 77  Resp: 20 15 20 16   Temp: 98.5 F (36.9 C) 98.1 F (36.7 C) 98.9 F (37.2 C) 99 F (37.2 C)  TempSrc:  Oral    SpO2: 94% 100% 96% 96%  Weight:  100.2 kg    Height:  5\' 5"  (1.651 m)     No intake or output data in the 24 hours ending 10/28/22 1304 Filed Weights   10/27/22 1204 10/27/22 1840  Weight: 90.7 kg 100.2 kg    Examination:  General exam: Appears calm and comfortable  Frail and debilitated.  Tremulous.  Not in any distress.  Flat affect. Respiratory system: No added sounds. Cardiovascular system: S1 & S2 heard, RRR.  Gastrointestinal system: Obese and pendulous.  Bowel sound present. Central nervous system: Alert and oriented. No focal neurological deficits. Gross generalized weakness. Skin:  Patient has 2.3 x 0.5 cm full-thickness wound with serosanguineous drainage on the left lateral leg, no surrounding erythema or signs of infection. She does have chronic venous stasis changes and shiny  skin consistent with chronic ischemic disease. No edema of the legs.  Unna boot replaced.    Data Reviewed: I have personally reviewed following labs and imaging studies  CBC: Recent Labs  Lab 10/27/22 1209 10/28/22 0439  WBC 6.7 8.1  HGB 12.4 11.3*  HCT 38.0 33.0*   MCV 97.4 92.7  PLT 174 248   Basic Metabolic Panel: Recent Labs  Lab 10/27/22 1209 10/27/22 2057 10/27/22 2202 10/28/22 0439  NA 134* 138  --  138  K 3.3* 3.0*  --  3.0*  CL 96* 97*  --  103  CO2 21* 28  --  24  GLUCOSE 97 123*  --  91  BUN 105* 99*  --  88*  CREATININE 3.70* 3.07*  --  2.25*  CALCIUM 9.9 10.0  --  9.7  MG  --   --  2.5*  --    GFR: Estimated Creatinine Clearance: 24.6 mL/min (A) (by C-G formula based on SCr of 2.25 mg/dL (H)). Liver Function Tests: Recent Labs  Lab 10/28/22 0439  AST 14*  ALT 13  ALKPHOS 69  BILITOT 1.1  PROT 6.5  ALBUMIN 3.6   No results for input(s): "LIPASE", "AMYLASE" in the last 168 hours. No results for input(s): "AMMONIA" in the last 168 hours. Coagulation Profile: No results for input(s): "INR", "PROTIME" in the last 168 hours. Cardiac Enzymes: No results for input(s): "CKTOTAL", "CKMB", "CKMBINDEX", "TROPONINI" in the last 168 hours. BNP (last 3 results) No results for input(s): "PROBNP" in the last 8760 hours. HbA1C: No results for input(s): "HGBA1C" in the last 72 hours. CBG: Recent Labs  Lab 10/28/22 0811  GLUCAP 95   Lipid Profile: No results for input(s): "CHOL", "HDL", "LDLCALC", "TRIG", "CHOLHDL", "LDLDIRECT" in the last 72 hours. Thyroid Function Tests: No results for input(s): "TSH", "T4TOTAL", "FREET4", "T3FREE", "THYROIDAB" in the last 72 hours. Anemia Panel: No results for input(s): "VITAMINB12", "FOLATE", "FERRITIN", "TIBC", "IRON", "RETICCTPCT" in the last 72 hours. Sepsis Labs: No results for input(s): "PROCALCITON", "LATICACIDVEN" in the last 168 hours.  No results found for this or any previous visit (from the past 240 hour(s)).       Radiology Studies: DG Chest 2 View  Result Date: 10/27/2022 CLINICAL DATA:  Weakness EXAM: CHEST - 2 VIEW COMPARISON:  CXR 07/02/20 FINDINGS: The heart size and mediastinal contours are within normal limits. Both lungs are clear. Degenerative changes of the  bilateral glenohumeral joints. Vertebral body heights are maintained. No radiographically apparent displaced rib fracture. IMPRESSION: No focal airspace opacity. Electronically Signed   By: Lorenza Cambridge M.D.   On: 10/27/2022 13:57   CT HEAD WO CONTRAST ( )  Result Date: 10/27/2022 CLINICAL DATA:  Stroke suspected EXAM: CT HEAD WITHOUT CONTRAST TECHNIQUE: Contiguous axial images were obtained from the base of the skull through the vertex without intravenous contrast. RADIATION DOSE REDUCTION: This exam was performed according to the departmental dose-optimization program which includes automated exposure control, adjustment of the mA and/or kV according to patient size and/or use of iterative reconstruction technique. COMPARISON:  None Available. FINDINGS: Brain: No evidence of acute infarction, hemorrhage, hydrocephalus, extra-axial collection or mass lesion/mass effect. Enlarged and partially empty sella. Vascular: No hyperdense vessel or unexpected calcification. Skull: Normal. Negative for fracture or focal lesion. Sinuses/Orbits: No middle ear or mastoid effusion. Polypoid mucosal thickening bilateral sphenoid sinuses. Bilateral lens replacement. Orbits are otherwise unremarkable. Other: None. IMPRESSION: No hemorrhage or CT evidence of an acute cortical infarct Electronically Signed   By: Sandria Senter  Celine Mans M.D.   On: 10/27/2022 12:53        Scheduled Meds:  amantadine  100 mg Oral BID   amLODipine  5 mg Oral Daily   carvedilol  50 mg Oral BID WC   donepezil  10 mg Oral Daily   enoxaparin (LOVENOX) injection  30 mg Subcutaneous Q24H   folic acid  500 mcg Oral Daily   [START ON 10/29/2022] levothyroxine  112 mcg Oral QAC breakfast   melatonin  2.5 mg Oral QHS   montelukast  10 mg Oral QPM   multivitamin with minerals  1 tablet Oral Daily   pantoprazole  40 mg Oral Daily   potassium chloride  20 mEq Oral BID   topiramate  25 mg Oral Daily   traZODone  50 mg Oral QHS   venlafaxine XR  225 mg  Oral Daily   Continuous Infusions:  0.9 % NaCl with KCl 20 mEq / L 75 mL/hr at 10/27/22 2313     LOS: 1 day    Time spent: 35 minutes    Dorcas Carrow, MD [facility] Pager [phone]

## 2022-10-28 NOTE — Evaluation (Signed)
Physical Therapy Evaluation Patient Details Name: ALYONNA KOPPER [name] MRN: [id number] DOB: [birth year] Today's Date: 10/28/2022  History of Present Illness  [name] is a 77yoF who [facility] c acute onset weakness. PMH: dCHF, CKD3, GERD, HTN, hypoTSH, depression, TBI c residual weakness.  Clinical Impression  Pt admitted with above Dx. Pt has functional limitations due to deficits below (see "PT Problem List"). Pt able to provide basic details on baseline functional status, however some parts in congruent at times. Today pt requires considerable physical assistance to perform bed mobility and attempt transfers, but is unable to achieve standing not perform any walking. Pt remains engaged and motivated, follows cues well, but does seem flat and delayed in response at times- correlate history of BI. Will continue to work toward advancing strength and mobility- pt agrees she could not safely meet her mobility needs at home at present. Sounds as though her husband requires more caregiver assistance than he is able to provide. Patient's performance this date reveals decreased ability, independence, and tolerance in performing all basic mobility required for performance of activities of daily living. Pt requires additional DME, close physical assistance, and cues for safe participate in mobility. Pt will benefit from skilled PT intervention to increase independence and safety with basic mobility in preparation for discharge to the venue listed below.     No data found.      Recommendations for follow up therapy are one component of a multi-disciplinary discharge planning process, led by the attending physician.  Recommendations may be updated based on patient status, additional functional criteria and insurance authorization.  Follow Up Recommendations Can patient physically be transported by private vehicle: No     Assistance Recommended at Discharge Frequent or constant  Supervision/Assistance  Patient can return home with the following  Two people to help with walking and/or transfers;Two people to help with bathing/dressing/bathroom;Assist for transportation;Help with stairs or ramp for entrance    Equipment Recommendations None recommended by PT  Recommendations for Other Services       Functional Status Assessment Patient has had a recent decline in their functional status and demonstrates the ability to make significant improvements in function in a reasonable and predictable amount of time.     Precautions / Restrictions Precautions Precautions: Fall Restrictions Weight Bearing Restrictions: No      Mobility  Bed Mobility Overal bed mobility: Needs Assistance Bed Mobility: Supine to Sit, Sit to Supine     Supine to sit: Mod assist Sit to supine: Mod assist   General bed mobility comments: very labored efforts, not much progress made without assist    Transfers Overall transfer level: Needs assistance Equipment used: Rolling walker (2 wheels) (RW then YRW attempted) Transfers: Sit to/from Stand Sit to Stand: From elevated surface, Min assist           General transfer comment: never able to come to independent standing, independent balance    Ambulation/Gait Ambulation/Gait assistance:  (unable to attempt, unsafe with prolonged standing)                Stairs            Wheelchair Mobility    Modified Rankin (Stroke Patients Only)       Balance  Pertinent Vitals/Pain Pain Assessment Pain Assessment: No/denies pain    Home Living Family/patient expects to be discharged to:: Private residence Living Arrangements: Spouse/significant other (husband requires caregiver asssitance) Available Help at Discharge: Personal care attendant Type of Home: House Home Access: Stairs to enter Entrance Stairs-Rails: Left;Right;Can reach both Entrance  Stairs-Number of Steps: 5 stairs, has a stairlift in garage   Home Layout: One level Home Equipment: Agricultural consultant (2 wheels);Rollator (4 wheels);Hand held shower head;Grab bars - tub/shower;Shower seat;Adaptive equipment Additional Comments: still works for town of [employer]    Prior Function Prior Level of Function : Driving             Mobility Comments: baseline balance issue, uses rollator for household AMB distances, uses a stairlift at entry, sleeps in a recliner, uses lift chair function to stand up       Hand Dominance        Extremity/Trunk Assessment   Upper Extremity Assessment Upper Extremity Assessment: Generalized weakness    Lower Extremity Assessment Lower Extremity Assessment: Generalized weakness       Communication      Cognition Arousal/Alertness: Awake/alert (fully oriented, but flat and delayed in respones (confusing lift chair and chair lift later in coversation, both of which she has at home)) Behavior During Therapy: WFL for tasks assessed/performed Overall Cognitive Status: Within Functional Limits for tasks assessed                                          General Comments      Exercises     Assessment/Plan    PT Assessment Patient needs continued PT services  PT Problem List Decreased strength;Decreased activity tolerance;Decreased balance;Decreased mobility;Decreased knowledge of use of DME;Decreased safety awareness;Decreased knowledge of precautions       PT Treatment Interventions DME instruction;Gait training;Stair training;Functional mobility training;Therapeutic activities;Therapeutic exercise;Balance training;Neuromuscular re-education;Patient/family education    PT Goals (Current goals can be found in the Care Plan section)  Acute Rehab PT Goals Patient Stated Goal: regain ability to perform household AMB PT Goal Formulation: With patient Time For Goal Achievement: 11/11/22 Potential to Achieve Goals:  Fair    Frequency Min 3X/week     Co-evaluation               AM-PAC PT "6 Clicks" Mobility  Outcome Measure Help needed turning from your back to your side while in a flat bed without using bedrails?: Total Help needed moving from lying on your back to sitting on the side of a flat bed without using bedrails?: Total Help needed moving to and from a bed to a chair (including a wheelchair)?: Total Help needed standing up from a chair using your arms (e.g., wheelchair or bedside chair)?: Total Help needed to walk in hospital room?: Total Help needed climbing 3-5 steps with a railing? : Total 6 Click Score: 6    End of Session   Activity Tolerance: Patient limited by fatigue Patient left: in bed;with call bell/phone within reach;with bed alarm set Nurse Communication: Mobility status PT Visit Diagnosis: Other abnormalities of gait and mobility (R26.89);Muscle weakness (generalized) (M62.81);Unsteadiness on feet (R26.81);Difficulty in walking, not elsewhere classified (R26.2)    Time: 4696-2952 PT Time Calculation (min) (ACUTE ONLY): 33 min   Charges:   PT Evaluation $PT Eval Moderate Complexity: 1 Mod PT Treatments $Therapeutic Activity: 8-22 mins       3:52  PM, 10/28/22 Rosamaria Lints, PT, DPT Physical Therapist - [facility]  [phone] (ASCOM)    Aricela Bertagnolli C 10/28/2022, 3:49 PM

## 2022-10-28 NOTE — Plan of Care (Signed)

## 2022-10-28 NOTE — Plan of Care (Signed)
Monitor serum electrolytes and urine osmolality; report abnormal values. Problem: Education: Goal: Knowledge of General Education information will improve Description: Including pain rating scale, medication(s)/side effects and non-pharmacologic comfort measures Outcome: Progressing   Problem: Health Behavior/Discharge Planning: Goal: Ability to manage health-related needs will improve Outcome: Progressing   Problem: Clinical Measurements: Goal: Ability to maintain clinical measurements within normal limits will improve Outcome: Progressing Goal: Will remain free from infection Outcome: Progressing Goal: Diagnostic test results will improve Outcome: Progressing Goal: Respiratory complications will improve Outcome: Progressing Goal: Cardiovascular complication will be avoided Outcome: Progressing   Problem: Activity: Goal: Risk for activity intolerance will decrease Outcome: Progressing   Problem: Nutrition: Goal: Adequate nutrition will be maintained Outcome: Progressing   Problem: Coping: Goal: Level of anxiety will decrease Outcome: Progressing   Problem: Elimination: Goal: Will not experience complications related to bowel motility Outcome: Progressing Goal: Will not experience complications related to urinary retention Outcome: Progressing   Problem: Pain Managment: Goal: General experience of comfort will improve Outcome: Progressing   Problem: Safety: Goal: Ability to remain free from injury will improve Outcome: Progressing   Problem: Skin Integrity: Goal: Risk for impaired skin integrity will decrease Outcome: Progressing

## 2022-10-28 NOTE — TOC Progression Note (Signed)
Transition of Care [facility] - Progression Note    Patient Details  Name: [name] MRN: [id number] Date of Birth: September 18, 1945  Transition of [facility] CM/SW Contact  [name], RN Phone Number: 10/28/2022, 9:36 AM  Clinical Narrative:   Spoke with the patient She stated that she lives at home with her husband She drives or a friend will drive her if she is not able She visited with her PCP earlier this month at [facility] and had [employer] applied for lower ext edema, they will follow up at the PCP office , She also sees Dr Joice Lofts at [facility] and has appt scheduled in June She stated that she has a rolling ealker at home and does not feel that she needs additional DME I asked if she had ever participated in CHF program at home She stated that she had not, I asked if she would be interested, she stated that she would not She stated that she has no needs      Expected Discharge Plan: Home/Self Care Barriers to Discharge: Continued Medical Work up  Expected Discharge Plan and Services   Discharge Planning Services: CM Consult   Living arrangements for the past 2 months: Single Family Home                 DME Arranged: N/A DME Agency: NA       HH Arranged: NA HH Agency: NA         Social Determinants of Health (SDOH) Interventions SDOH Screenings   Food Insecurity: No Food Insecurity (10/27/2022)  Housing: Low Risk  (10/27/2022)  Transportation Needs: No Transportation Needs (10/27/2022)  Utilities: Not At Risk (10/27/2022)  Depression (PHQ2-9): Low Risk  (10/01/2022)  Tobacco Use: Low Risk  (10/27/2022)    Readmission Risk Interventions     No data to display

## 2022-10-28 NOTE — Consult Note (Signed)
WOC Nurse Consult Note: patient with chronic venous insufficiency with venous stasis and hx of lymphedema, followed at [facility] clinic with application of weekly unna boots last placed 10/20/2022; this visit was made with Dr. Jerral Ralph  Reason for Consult: venous stasis bilaterally  Wound type: full thickness L lateral leg  Pressure Injury POA: NA  Measurement: 2.3 cm x 0.5 cm x 0.1 full thickness L lateral leg  Wound bed:50% yellow white 50% pink moist  Drainage (amount, consistency, odor) minimal serosanguinous  Periwound: intact, no real appreciable edema at this visit  R leg was also evaluated, some old scabbed areas but no open wounds at this time  Dressing procedure/placement/frequency: Cleaned left lateral leg ulcer with NS, applied Calcium Alginate [place] [phone]) cut to fit wound bed. Unna boots applied bilaterally as per manufacturers instructions and secured with Coban.    Patient will require these to be changed Wednesday 11/04/2022.  WOC will follow to replace unna boots if patient still inpatient. If patient discharged she is to follow-up with [facility] in a week to remove these unna boots and replace at their discretion.   Thank you,   ,  Priscella Mann MSN, RN-BC, [employer] [phone]

## 2022-10-29 DIAGNOSIS — K2100 Gastro-esophageal reflux disease with esophagitis, without bleeding: Secondary | ICD-10-CM | POA: Diagnosis not present

## 2022-10-29 DIAGNOSIS — N170 Acute kidney failure with tubular necrosis: Secondary | ICD-10-CM | POA: Diagnosis not present

## 2022-10-29 DIAGNOSIS — E039 Hypothyroidism, unspecified: Secondary | ICD-10-CM | POA: Diagnosis not present

## 2022-10-29 LAB — CBC WITH DIFFERENTIAL/PLATELET
Abs Immature Granulocytes: 0.02 10*3/uL (ref 0.00–0.07)
Basophils Absolute: 0.1 10*3/uL (ref 0.0–0.1)
Basophils Relative: 1 %
Eosinophils Absolute: 0.3 10*3/uL (ref 0.0–0.5)
Eosinophils Relative: 4 %
HCT: 31.5 % — ABNORMAL LOW (ref 36.0–46.0)
Hemoglobin: 10.4 g/dL — ABNORMAL LOW (ref 12.0–15.0)
Immature Granulocytes: 0 %
Lymphocytes Relative: 32 %
Lymphs Abs: 2.3 10*3/uL (ref 0.7–4.0)
MCH: 31.6 pg (ref 26.0–34.0)
MCHC: 33 g/dL (ref 30.0–36.0)
MCV: 95.7 fL (ref 80.0–100.0)
Monocytes Absolute: 0.7 10*3/uL (ref 0.1–1.0)
Monocytes Relative: 10 %
Neutro Abs: 3.7 10*3/uL (ref 1.7–7.7)
Neutrophils Relative %: 53 %
Platelets: 214 10*3/uL (ref 150–400)
RBC: 3.29 MIL/uL — ABNORMAL LOW (ref 3.87–5.11)
RDW: 13.2 % (ref 11.5–15.5)
WBC: 7 10*3/uL (ref 4.0–10.5)
nRBC: 0 % (ref 0.0–0.2)

## 2022-10-29 LAB — BASIC METABOLIC PANEL
Anion gap: 6 (ref 5–15)
BUN: 61 mg/dL — ABNORMAL HIGH (ref 8–23)
CO2: 24 mmol/L (ref 22–32)
Calcium: 9.6 mg/dL (ref 8.9–10.3)
Chloride: 108 mmol/L (ref 98–111)
Creatinine, Ser: 1.55 mg/dL — ABNORMAL HIGH (ref 0.44–1.00)
GFR, Estimated: 34 mL/min — ABNORMAL LOW (ref >60)
Glucose, Bld: 99 mg/dL (ref 70–99)
Potassium: 3.8 mmol/L (ref 3.5–5.1)
Sodium: 138 mmol/L (ref 135–145)

## 2022-10-29 LAB — MAGNESIUM: Magnesium: 2.4 mg/dL (ref 1.7–2.4)

## 2022-10-29 LAB — UREA NITROGEN, URINE: Urea Nitrogen, Ur: 368 mg/dL

## 2022-10-29 NOTE — Progress Notes (Signed)
Physical Therapy Treatment Patient Details Name: [name] MRN: [id number] DOB: July 10, 1945 Today's Date: 10/29/2022   History of Present Illness [name] is a 77yoF who comes to [facility] on 10/27/22 c acute onset weakness. PMH: dCHF, CKD3, GERD, HTN, hypoTSH, depression, TBI c residual weakness.    PT Comments    P tin bed on entry, awake, reports no improvements in subjective report since previous day. Pt agreeable to session. Pt guided through exercises in bed, requires assistance at times to full desired ROM and offered resistance at time to achieve target intensity of loading. Pt able to partake in 4 dependent STS transfers at elevated EOB, still requires modA, only tolerates a few seconds standing with each. She has elevated RR with each, VSS, correlate some falls anxiety. Pt remains flat affect throughout, doesn't say much. At EOB and in bed, noted significant kyphosis and forward head posturing that makes EOB balance complicated. Pt assisted back to bed at EOS, seated 46 degrees with pillow behind back to reduce kyphotic instigators. NSG called to navigate purewick positioning per pt request, she's asks author not be involved in things purewick.  Recommendations for follow up therapy are one component of a multi-disciplinary discharge planning process, led by the attending physician.  Recommendations may be updated based on patient status, additional functional criteria and insurance authorization.  Follow Up Recommendations  Can patient physically be transported by private vehicle: No    Assistance Recommended at Discharge Frequent or constant Supervision/Assistance  Patient can return home with the following Two people to help with walking and/or transfers;Two people to help with bathing/dressing/bathroom;Assist for transportation;Help with stairs or ramp for entrance   Equipment Recommendations  None recommended by PT    Recommendations for Other Services        Precautions / Restrictions Precautions Precautions: Fall Restrictions Weight Bearing Restrictions: No     Mobility  Bed Mobility Overal bed mobility: Needs Assistance Bed Mobility: Supine to Sit, Sit to Supine     Supine to sit: Max assist, +2 for physical assistance Sit to supine: Max assist, +2 for physical assistance   General bed mobility comments: recliner to upright sitting, BUE pulling blanket around foot board, modA at mid back x8    Transfers Overall transfer level: Needs assistance Equipment used: None Transfers: Sit to/from Stand Sit to Stand: From elevated surface, Mod assist           General transfer comment: x4    Ambulation/Gait Ambulation/Gait assistance:  (too weak to try; standing tolerance is quite limited in time)                 Optometrist    Modified Rankin (Stroke Patients Only)       Balance                                            Cognition Arousal/Alertness: Awake/alert Behavior During Therapy: WFL for tasks assessed/performed, Flat affect                                   General Comments: A&Ox4        Exercises Other Exercises Other Exercises: BLE: heel slides (AA/ROM concentric, AR/ROM eccentric) x10, ABDCT/ADD x10, BUE  assisted situps x8 (from slight recliner to upright sitting) modA at back; Other Exercises: STS from EOB x4, elevated surface, modA dependent style transfer Other Exercises: SLR 1x5 bilat    General Comments        Pertinent Vitals/Pain Pain Assessment Pain Assessment: No/denies pain    Home Living                          Prior Function            PT Goals (current goals can now be found in the care plan section) Acute Rehab PT Goals Patient Stated Goal: regain ability to perform household AMB PT Goal Formulation: With patient Time For Goal Achievement: 11/11/22 Potential to Achieve Goals:  Fair Progress towards PT goals: Not progressing toward goals - comment    Frequency    Min 3X/week      PT Plan Current plan remains appropriate    Co-evaluation              AM-PAC PT "6 Clicks" Mobility   Outcome Measure  Help needed turning from your back to your side while in a flat bed without using bedrails?: Total Help needed moving from lying on your back to sitting on the side of a flat bed without using bedrails?: Total Help needed moving to and from a bed to a chair (including a wheelchair)?: Total Help needed standing up from a chair using your arms (e.g., wheelchair or bedside chair)?: Total Help needed to walk in hospital room?: Total Help needed climbing 3-5 steps with a railing? : Total 6 Click Score: 6    End of Session Equipment Utilized During Treatment: Gait belt Activity Tolerance: Patient limited by fatigue;Patient tolerated treatment well;No increased pain Patient left: in bed;with call bell/phone within reach Nurse Communication: Mobility status PT Visit Diagnosis: Other abnormalities of gait and mobility (R26.89);Muscle weakness (generalized) (M62.81);Unsteadiness on feet (R26.81);Difficulty in walking, not elsewhere classified (R26.2)     Time: 0865-7846 PT Time Calculation (min) (ACUTE ONLY): 24 min  Charges:  $Therapeutic Exercise: 23-37 mins                    3:52 PM, 10/29/22 Rosamaria Lints, PT, DPT Physical Therapist - [facility]  [phone] (ASCOM)    Ellee Wawrzyniak C 10/29/2022, 3:47 PM

## 2022-10-29 NOTE — NC FL2 (Signed)
[place] MEDICAID FL2 LEVEL OF CARE FORM     IDENTIFICATION  Patient Name: [name] [facility] [name] Birthdate: 1945-12-28 Sex: female Admission Date (Current Location): 10/27/2022  [place] and [place] Number:  Chiropodist and Address:  [facility], [address] [place], [place] [address]      Provider Number: [id number]  Attending Physician Name and Address:  Dorcas Carrow, MD  Relative Name and Phone Number:       Current Level of Care: Hospital Recommended Level of Care: Skilled Nursing Facility Prior Approval Number:    Date Approved/Denied:   PASRR Number: [id number] (On [place], last names do not have space).  Discharge Plan: SNF    Current Diagnoses: Patient Active Problem List   Diagnosis Date Noted   Acute-on-chronic kidney injury (HCC) 10/27/2022   Weakness 10/27/2022   Venous stasis 10/27/2022   Community acquired pneumonia 07/03/2020   OSA (obstructive sleep apnea) 07/03/2020   CAP (community acquired pneumonia) 07/03/2020   Sepsis (HCC) 07/02/2020   History of anesthesia reaction 02/20/2020   Swelling of limb 02/20/2020   Chronic diastolic CHF (congestive heart failure) (HCC) 08/22/2019   Edema 07/29/2019   Prediabetes 07/07/2019   Depression 04/26/2019   Disease of thyroid gland 04/26/2019   GERD (gastroesophageal reflux disease) 04/26/2019   History of head injury 04/26/2019   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/26/2019   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   Spasm of lumbar paraspinous muscle 02/14/2019   Chronic pain syndrome 02/14/2019   Use of cane as ambulatory aid 01/04/2019   Acneiform eruption 07/09/2017   Lumbar spondylosis 04/20/2017   Lumbar facet arthropathy 04/20/2017   Lumbar degenerative disc disease 04/20/2017   Anxiety 11/05/2016   Facet arthritis of lumbar region 09/16/2016   Low back pain 07/25/2016   Morbid obesity with BMI of 40.0-44.9, adult (HCC) 06/03/2016   Acquired  hypothyroidism 03/04/2016   Cognitive deficit as late effect of traumatic brain injury (HCC) 03/04/2016   Health care maintenance 03/04/2016   HTN, goal below 140/90 03/04/2016   Hypokalemia 03/04/2016   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   Localized primary osteoarthritis of lower leg, right 08/21/2015   Primary osteoarthritis of left knee 04/10/2015   Right knee pain 01/18/2013    Orientation RESPIRATION BLADDER Height & Weight     Self, Time, Situation, Place  Normal Incontinent Weight: 220 lb 14.4 oz (100.2 kg) Height:  5\' 5"  (165.1 cm)  BEHAVIORAL SYMPTOMS/MOOD NEUROLOGICAL BOWEL NUTRITION STATUS   (None)  (History of TBI) Continent Diet (Heart healthy/carb modified)  AMBULATORY STATUS COMMUNICATION OF NEEDS Skin   Extensive Assist Verbally Bruising, Other (Comment) (Scratch marks. Venous stasis ulcer on left pretibial: Gauze.)                       Personal Care Assistance Level of Assistance  Bathing, Feeding, Dressing Bathing Assistance: Maximum assistance Feeding assistance: Limited assistance Dressing Assistance: Maximum assistance     Functional Limitations Info  Sight, Hearing, Speech Sight Info: Adequate Hearing Info: Adequate Speech Info: Adequate    SPECIAL CARE FACTORS FREQUENCY  PT (By licensed PT), OT (By licensed OT)     PT Frequency: 5 x week OT Frequency: 5 x week            Contractures Contractures Info: Not present    Additional Factors Info  Code Status, Allergies Code Status Info: Full code Allergies Info: Codeine, Povidone Iodine, Hydromorphone, Latex, Oxycodone-acetaminophen, Sulfa Antibiotics, Bupropion, Gabapentin, Hydrocodone,  Morphine And Codeine, Procaine           Current Medications (10/29/2022):  This is the current hospital active medication list Current Facility-Administered Medications  Medication Dose Route Frequency Provider Last Rate Last Admin   acetaminophen (TYLENOL) tablet 650 mg  650 mg Oral Q6H  PRN Andris Baumann, MD   650 mg at 10/28/22 0153   albuterol (PROVENTIL) (2.5 MG/3ML) 0.083% nebulizer solution 1.25 mg  1.25 mg Nebulization QID PRN Dorcas Carrow, MD       amantadine (SYMMETREL) capsule 100 mg  100 mg Oral Daily Dorcas Carrow, MD   100 mg at 10/29/22 0919   amLODipine (NORVASC) tablet 5 mg  5 mg Oral Daily Dorcas Carrow, MD   5 mg at 10/29/22 0903   carvedilol (COREG) tablet 50 mg  50 mg Oral BID WC Dorcas Carrow, MD   50 mg at 10/29/22 0903   donepezil (ARICEPT) tablet 10 mg  10 mg Oral Daily Dorcas Carrow, MD   10 mg at 10/29/22 0903   enoxaparin (LOVENOX) injection 30 mg  30 mg Subcutaneous Q24H Floydene Flock, MD   30 mg at 10/28/22 1739   folic acid (FOLVITE) tablet 0.5 mg  500 mcg Oral Daily Dorcas Carrow, MD   0.5 mg at 10/29/22 1610   levothyroxine (SYNTHROID) tablet 112 mcg  112 mcg Oral QAC breakfast Dorcas Carrow, MD   112 mcg at 10/29/22 9604   melatonin tablet 2.5 mg  2.5 mg Oral QHS Dorcas Carrow, MD   2.5 mg at 10/28/22 2106   montelukast (SINGULAIR) tablet 10 mg  10 mg Oral QPM Dorcas Carrow, MD   10 mg at 10/28/22 1739   multivitamin with minerals tablet 1 tablet  1 tablet Oral Daily Dorcas Carrow, MD   1 tablet at 10/29/22 0903   ondansetron (ZOFRAN) tablet 4 mg  4 mg Oral Q6H PRN Floydene Flock, MD       Or   ondansetron [facility]) injection 4 mg  4 mg Intravenous Q6H PRN Floydene Flock, MD       pantoprazole (PROTONIX) EC tablet 40 mg  40 mg Oral Daily Dorcas Carrow, MD   40 mg at 10/29/22 0903   potassium chloride SA (KLOR-CON M) CR tablet 20 mEq  20 mEq Oral BID Dorcas Carrow, MD   20 mEq at 10/29/22 0903   topiramate (TOPAMAX) tablet 25 mg  25 mg Oral Daily Dorcas Carrow, MD   25 mg at 10/29/22 0904   traZODone (DESYREL) tablet 50 mg  50 mg Oral QHS Dorcas Carrow, MD   50 mg at 10/28/22 2105   venlafaxine XR (EFFEXOR-XR) 24 hr capsule 225 mg  225 mg Oral Daily Dorcas Carrow, MD   225 mg at 10/29/22 5409     Discharge  Medications: Please see discharge summary for a list of discharge medications.  Relevant Imaging Results:  Relevant Lab Results:   Additional Information SS#: [id number]  Margarito Liner, LCSW

## 2022-10-29 NOTE — Progress Notes (Signed)
PROGRESS NOTE    Antonya Carmer [facility] [name]  ZOX:[id number] DOB: June 19, 1945 DOA: 10/27/2022 PCP: Lauro Regulus, MD    Brief Narrative:  77 year old with history of multiple medical issues including chronic diastolic heart failure, CKD stage IIIb, GERD, hypertension, hypothyroidism, depression, cognitive impairment brought to the ER with acute on chronic renal disease.  Patient does have venous ulcers of the lower extremities and followed at [facility] clinic wound care, she was also on multiple diuretics, she was given additional Zaroxolyn as well as doxycycline last week.  Weak, shaking with standing so called EMS.  In the emergency room hemodynamically stable.  Found to have significantly elevated creatinine.   Assessment & Plan:   Acute on chronic kidney disease stage IIIb, baseline creatinine around 1.5. This is likely due to poor oral intake, use of Lasix, spironolactone and Zaroxolyn in addition.  Patient already showing signs of improvement.  Levels normalized today.  Discontinue further IV fluids and monitor.  Recheck levels tomorrow morning. She is using Unna boot, no significant leg edema now.  Will reduce dose of diuretics on discharge.  Weakness, deconditioning: Overall weak.  No focal deficit.  CT head normal limits.  This is multifactorial.  Work with PT OT.  May benefit with PT OT at home or to skilled nursing home.  Bilateral leg venous stasis ulcers and swelling: Completed doxycycline therapy. Seen by wound care.  Unna boots placed.  Follow-up at [facility] clinic for next [employer]. She currently has compressed legs, will avoid further diuretics.  Hypokalemia: Replaced.  Magnesium is adequate.  Essential hypertension: Blood pressure stable GERD: On PPI Chronic diastolic heart failure: Euvolemic.  Dehydrated.  Holding Aldactone and Lasix.  Monitor for fluid overload. Hypothyroidism: On Synthroid.  Continue.  Continue to mobilize.  Patient had poor mobility  yesterday.  If she does not do well, she will consider going to a SNF.   DVT prophylaxis: enoxaparin (LOVENOX) injection 30 mg Start: 10/27/22 1600 SCDs Start: 10/27/22 1445   Code Status: Full code Family Communication: None at the bedside Disposition Plan: Status is: Inpatient Remains inpatient appropriate because: IV fluids, mobility safety pending.     Consultants:  None  Procedures:  None  Antimicrobials:  None   Subjective: Patient was seen and examined.  Denies any complaints.  She tells me that she could not stand up yesterday.  We discussed about going to skilled nursing facility if still with poor mobility and patient agreeable.  No other overnight events.  Appetite is fair.   Objective: Vitals:   10/28/22 0824 10/28/22 1458 10/28/22 2258 10/29/22 0739  BP: (!) 138/52 (!) 139/53 (!) 133/55 (!) 135/58  Pulse: 77 81 72 75  Resp: 16 16 18 17   Temp: 99 F (37.2 C) 98.4 F (36.9 C) 98.9 F (37.2 C) 98.2 F (36.8 C)  TempSrc:   Oral   SpO2: 96% 97% 93% 98%  Weight:      Height:        Intake/Output Summary (Last 24 hours) at 10/29/2022 1336 Last data filed at 10/29/2022 8119 Gross per 24 hour  Intake 986.29 ml  Output 1250 ml  Net -263.71 ml   Filed Weights   10/27/22 1204 10/27/22 1840  Weight: 90.7 kg 100.2 kg    Examination:  General exam: Appears calm and comfortable.  Chronically sick looking.  Frail.  Not in any distress.  On room air. Respiratory system: No added sounds. Cardiovascular system: S1 & S2 heard, RRR.  Gastrointestinal system: Obese and  pendulous.  Bowel sound present. Central nervous system: Alert and oriented. No focal neurological deficits. Gross generalized weakness. Skin:  Patient has 2.3 x 0.5 cm full-thickness wound with serosanguineous drainage on the left lateral leg, no surrounding erythema or signs of infection. She does have chronic venous stasis changes and shiny skin consistent with chronic ischemic disease. No  edema of the legs.  Unna boot replaced.    Data Reviewed: I have personally reviewed following labs and imaging studies  CBC: Recent Labs  Lab 10/27/22 1209 10/28/22 0439 10/29/22 0540  WBC 6.7 8.1 7.0  NEUTROABS  --   --  3.7  HGB 12.4 11.3* 10.4*  HCT 38.0 33.0* 31.5*  MCV 97.4 92.7 95.7  PLT 174 248 214    Basic Metabolic Panel: Recent Labs  Lab 10/27/22 1209 10/27/22 2057 10/27/22 2202 10/28/22 0439 10/29/22 0540  NA 134* 138  --  138 138  K 3.3* 3.0*  --  3.0* 3.8  CL 96* 97*  --  103 108  CO2 21* 28  --  24 24  GLUCOSE 97 123*  --  91 99  BUN 105* 99*  --  88* 61*  CREATININE 3.70* 3.07*  --  2.25* 1.55*  CALCIUM 9.9 10.0  --  9.7 9.6  MG  --   --  2.5*  --  2.4    GFR: Estimated Creatinine Clearance: 35.7 mL/min (A) (by C-G formula based on SCr of 1.55 mg/dL (H)). Liver Function Tests: Recent Labs  Lab 10/28/22 0439  AST 14*  ALT 13  ALKPHOS 69  BILITOT 1.1  PROT 6.5  ALBUMIN 3.6    No results for input(s): "LIPASE", "AMYLASE" in the last 168 hours. No results for input(s): "AMMONIA" in the last 168 hours. Coagulation Profile: No results for input(s): "INR", "PROTIME" in the last 168 hours. Cardiac Enzymes: No results for input(s): "CKTOTAL", "CKMB", "CKMBINDEX", "TROPONINI" in the last 168 hours. BNP (last 3 results) No results for input(s): "PROBNP" in the last 8760 hours. HbA1C: No results for input(s): "HGBA1C" in the last 72 hours. CBG: Recent Labs  Lab 10/28/22 0811  GLUCAP 95    Lipid Profile: No results for input(s): "CHOL", "HDL", "LDLCALC", "TRIG", "CHOLHDL", "LDLDIRECT" in the last 72 hours. Thyroid Function Tests: No results for input(s): "TSH", "T4TOTAL", "FREET4", "T3FREE", "THYROIDAB" in the last 72 hours. Anemia Panel: No results for input(s): "VITAMINB12", "FOLATE", "FERRITIN", "TIBC", "IRON", "RETICCTPCT" in the last 72 hours. Sepsis Labs: No results for input(s): "PROCALCITON", "LATICACIDVEN" in the last 168  hours.  No results found for this or any previous visit (from the past 240 hour(s)).       Radiology Studies: US RENAL  Result Date: 10/28/2022 CLINICAL DATA:  [id number] Acute-on-chronic kidney injury [facility]) [id number] EXAM: RENAL / URINARY TRACT ULTRASOUND COMPLETE COMPARISON:  None Available. FINDINGS: Right Kidney: Renal measurements: 9.7 x 6.0 x 5.8 cm = volume: 178.2 mL. There is a solid echogenic mass measuring 0.8 x 0.7 x 0.8 cm compatible with an angiomyolipoma. No hydronephrosis. Mildly increased renal cortical echogenicity. Left Kidney: Renal measurements: 8.3 x 5.2 x 4.3 cm = volume: 96.5 mL. There is a 1.8 cm simple cyst which requires no follow-up imaging. Increased renal cortical echogenicity. No hydronephrosis. Bladder: Appears normal for degree of bladder distention. Other: Limited study due to body habitus. IMPRESSION: No hydronephrosis. Mildly increased renal cortical echogenicity bilaterally, as can be seen in medical renal disease. 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  This can be further evaluated with non-emergent contrast enhanced CT or MRI as an outpatient. Electronically Signed   By: Caprice Renshaw M.D.   On: 10/28/2022 14:05        Scheduled Meds:  amantadine  100 mg Oral Daily   amLODipine  5 mg Oral Daily   carvedilol  50 mg Oral BID WC   donepezil  10 mg Oral Daily   enoxaparin (LOVENOX) injection  30 mg Subcutaneous Q24H   folic acid  500 mcg Oral Daily   levothyroxine  112 mcg Oral QAC breakfast   melatonin  2.5 mg Oral QHS   montelukast  10 mg Oral QPM   multivitamin with minerals  1 tablet Oral Daily   pantoprazole  40 mg Oral Daily   potassium chloride  20 mEq Oral BID   topiramate  25 mg Oral Daily   traZODone  50 mg Oral QHS   venlafaxine XR  225 mg Oral Daily   Continuous Infusions:     LOS: 2 days    Time spent: 35 minutes    Dorcas Carrow, MD [facility] Pager [phone]

## 2022-10-29 NOTE — Plan of Care (Signed)

## 2022-10-30 DIAGNOSIS — K2100 Gastro-esophageal reflux disease with esophagitis, without bleeding: Secondary | ICD-10-CM | POA: Diagnosis not present

## 2022-10-30 DIAGNOSIS — N170 Acute kidney failure with tubular necrosis: Secondary | ICD-10-CM | POA: Diagnosis not present

## 2022-10-30 DIAGNOSIS — E039 Hypothyroidism, unspecified: Secondary | ICD-10-CM | POA: Diagnosis not present

## 2022-10-30 LAB — BASIC METABOLIC PANEL
Anion gap: 8 (ref 5–15)
BUN: 37 mg/dL — ABNORMAL HIGH (ref 8–23)
CO2: 21 mmol/L — ABNORMAL LOW (ref 22–32)
Calcium: 10.8 mg/dL — ABNORMAL HIGH (ref 8.9–10.3)
Chloride: 112 mmol/L — ABNORMAL HIGH (ref 98–111)
Creatinine, Ser: 1.37 mg/dL — ABNORMAL HIGH (ref 0.44–1.00)
GFR, Estimated: 40 mL/min — ABNORMAL LOW (ref >60)
Glucose, Bld: 97 mg/dL (ref 70–99)
Potassium: 3.9 mmol/L (ref 3.5–5.1)
Sodium: 141 mmol/L (ref 135–145)

## 2022-10-30 MED ORDER — CLONAZEPAM 0.5 MG PO TABS
0.5000 mg | ORAL_TABLET | Freq: Two times a day (BID) | ORAL | Status: DC | PRN
  Administered 2022-10-30 – 2022-11-02 (×7): 0.5 mg via ORAL
  Filled 2022-10-30 (×7): qty 1

## 2022-10-30 MED ORDER — ENOXAPARIN SODIUM 60 MG/0.6ML IJ SOSY
0.5000 mg/kg | PREFILLED_SYRINGE | INTRAMUSCULAR | Status: DC
  Administered 2022-10-30 – 2022-11-01 (×3): 50 mg via SUBCUTANEOUS
  Filled 2022-10-30 (×3): qty 0.6

## 2022-10-30 NOTE — Progress Notes (Signed)
PROGRESS NOTE    [name]  UJW:[id number] DOB: [birth year] DOA: 10/27/2022 PCP: Lauro Regulus, MD    Brief Narrative:  77 year old with history of multiple medical issues including chronic diastolic heart failure, CKD stage IIIb, GERD, hypertension, hypothyroidism, depression, cognitive impairment brought to the ER with acute on chronic renal disease.  Patient does have venous ulcers of the lower extremities and followed at [facility] clinic wound care, she was also on multiple diuretics, she was given additional Zaroxolyn as well as doxycycline last week.  Weak, shaking with standing so called EMS.  In the emergency room hemodynamically stable.  Found to have significantly elevated creatinine.   Assessment & Plan:   Acute on chronic kidney disease stage IIIb, baseline creatinine around 1.5. This is likely due to poor oral intake, use of Lasix, spironolactone and Zaroxolyn in addition.  Patient already showing signs of improvement.  Her kidney functions are stable without additional IV fluids. She is using Unna boot, no significant leg edema now.  Will start spironolactone.  Patient may not benefit with too much diuresis.  Weakness, deconditioning: Overall weak.  No focal deficit.  CT head normal limits.  This is multifactorial.  Work with PT OT.  Refer to SNF.  Bilateral leg venous stasis ulcers and swelling: Completed doxycycline therapy. Seen by wound care.  Unna boots placed.  Follow-up at [facility] clinic for next [employer]. She currently has compressed legs, will avoid further diuretics.  Hypokalemia: Replaced.  Magnesium is adequate.  Essential hypertension: Blood pressure stable GERD: On PPI Chronic diastolic heart failure: Euvolemic.  Dehydrated.  Holding Aldactone and Lasix.  Monitor for fluid overload. Hypothyroidism: On Synthroid.  Continue. Chronic anxiety disorder: On Effexor.  Hospital-acquired delirium: Patient had developed delirium while in  the hospital and now clearing up.  Avoid polypharmacy.  Will avoid any benzodiazepines.  Regulate day and night cycle.  Continue to mobilize.  Patient had poor mobility.  Transfer to SNF when bed available.   DVT prophylaxis: enoxaparin (LOVENOX) injection 30 mg Start: 10/27/22 1600 SCDs Start: 10/27/22 1445   Code Status: Full code Family Communication: None at the bedside Disposition Plan: Status is: Inpatient Remains inpatient appropriate because: Unsafe discharge disposition.     Consultants:  None  Procedures:  None  Antimicrobials:  None   Subjective:  Patient seen and examined.  Patient herself tells me that she was confused and delirious yesterday.  Gets anxious.  Patient is agreeable to go to residential rehab.  She lives with her husband who has dementia and they have 1 caretaker for both of them.  Patient really wishes she can at least stand on her feet.  Currently denies any complaints.  Eating adequate.   Objective: Vitals:   10/29/22 1536 10/29/22 2056 10/29/22 2345 10/30/22 0747  BP: 131/65 (!) 126/58 (!) 140/60 139/66  Pulse: 73 77 76 75  Resp: 20 18 20 16   Temp: 98.1 F (36.7 C)  98 F (36.7 C) 98.2 F (36.8 C)  TempSrc:      SpO2: 100%  94% 95%  Weight:      Height:        Intake/Output Summary (Last 24 hours) at 10/30/2022 1034 Last data filed at 10/30/2022 0500 Gross per 24 hour  Intake --  Output 2925 ml  Net -2925 ml    Filed Weights   10/27/22 1204 10/27/22 1840  Weight: 90.7 kg 100.2 kg    Examination:  General exam: Appears calm and comfortable.  Chronically  sick looking.  Frail.  Not in any distress.  On room air. Patient currently alert awake and oriented.  Generalized weakness. Respiratory system: No added sounds. Cardiovascular system: S1 & S2 heard, RRR.  Gastrointestinal system: Obese and pendulous.  Bowel sound present. Skin:  Patient has 2.3 x 0.5 cm full-thickness wound with serosanguineous drainage on the left  lateral leg, no surrounding erythema or signs of infection. She does have chronic venous stasis changes and shiny skin consistent with chronic ischemic disease. No edema of the legs.  Unna boot replaced.    Data Reviewed: I have personally reviewed following labs and imaging studies  CBC: Recent Labs  Lab 10/27/22 1209 10/28/22 0439 10/29/22 0540  WBC 6.7 8.1 7.0  NEUTROABS  --   --  3.7  HGB 12.4 11.3* 10.4*  HCT 38.0 33.0* 31.5*  MCV 97.4 92.7 95.7  PLT 174 248 214    Basic Metabolic Panel: Recent Labs  Lab 10/27/22 1209 10/27/22 2057 10/27/22 2202 10/28/22 0439 10/29/22 0540 10/30/22 0520  NA 134* 138  --  138 138 141  K 3.3* 3.0*  --  3.0* 3.8 3.9  CL 96* 97*  --  103 108 112*  CO2 21* 28  --  24 24 21*  GLUCOSE 97 123*  --  91 99 97  BUN 105* 99*  --  88* 61* 37*  CREATININE 3.70* 3.07*  --  2.25* 1.55* 1.37*  CALCIUM 9.9 10.0  --  9.7 9.6 10.8*  MG  --   --  2.5*  --  2.4  --     GFR: Estimated Creatinine Clearance: 40.3 mL/min (A) (by C-G formula based on SCr of 1.37 mg/dL (H)). Liver Function Tests: Recent Labs  Lab 10/28/22 0439  AST 14*  ALT 13  ALKPHOS 69  BILITOT 1.1  PROT 6.5  ALBUMIN 3.6    No results for input(s): "LIPASE", "AMYLASE" in the last 168 hours. No results for input(s): "AMMONIA" in the last 168 hours. Coagulation Profile: No results for input(s): "INR", "PROTIME" in the last 168 hours. Cardiac Enzymes: No results for input(s): "CKTOTAL", "CKMB", "CKMBINDEX", "TROPONINI" in the last 168 hours. BNP (last 3 results) No results for input(s): "PROBNP" in the last 8760 hours. HbA1C: No results for input(s): "HGBA1C" in the last 72 hours. CBG: Recent Labs  Lab 10/28/22 0811  GLUCAP 95    Lipid Profile: No results for input(s): "CHOL", "HDL", "LDLCALC", "TRIG", "CHOLHDL", "LDLDIRECT" in the last 72 hours. Thyroid Function Tests: No results for input(s): "TSH", "T4TOTAL", "FREET4", "T3FREE", "THYROIDAB" in the last 72  hours. Anemia Panel: No results for input(s): "VITAMINB12", "FOLATE", "FERRITIN", "TIBC", "IRON", "RETICCTPCT" in the last 72 hours. Sepsis Labs: No results for input(s): "PROCALCITON", "LATICACIDVEN" in the last 168 hours.  No results found for this or any previous visit (from the past 240 hour(s)).       Radiology Studies: US RENAL  Result Date: 10/28/2022 CLINICAL DATA:  [id number] Acute-on-chronic kidney injury [facility]) [id number] EXAM: RENAL / URINARY TRACT ULTRASOUND COMPLETE COMPARISON:  None Available. FINDINGS: Right Kidney: Renal measurements: 9.7 x 6.0 x 5.8 cm = volume: 178.2 mL. There is a solid echogenic mass measuring 0.8 x 0.7 x 0.8 cm compatible with an angiomyolipoma. No hydronephrosis. Mildly increased renal cortical echogenicity. Left Kidney: Renal measurements: 8.3 x 5.2 x 4.3 cm = volume: 96.5 mL. There is a 1.8 cm simple cyst which requires no follow-up imaging. Increased renal cortical echogenicity. No hydronephrosis. Bladder: Appears normal for degree of  bladder distention. Other: Limited study due to body habitus. IMPRESSION: No hydronephrosis. Mildly increased renal cortical echogenicity bilaterally, as can be seen in medical renal disease. 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 This can be further evaluated with non-emergent contrast enhanced CT or MRI as an outpatient. Electronically Signed   By: Caprice Renshaw M.D.   On: 10/28/2022 14:05        Scheduled Meds:  amantadine  100 mg Oral Daily   amLODipine  5 mg Oral Daily   carvedilol  50 mg Oral BID WC   donepezil  10 mg Oral Daily   enoxaparin (LOVENOX) injection  30 mg Subcutaneous Q24H   folic acid  500 mcg Oral Daily   levothyroxine  112 mcg Oral QAC breakfast   melatonin  2.5 mg Oral QHS   montelukast  10 mg Oral QPM   multivitamin with minerals  1 tablet Oral Daily   pantoprazole  40 mg Oral Daily   topiramate  25 mg Oral Daily   traZODone  50 mg Oral QHS    venlafaxine XR  225 mg Oral Daily   Continuous Infusions:     LOS: 3 days    Time spent: 35 minutes    Dorcas Carrow, MD [facility] Pager [phone]

## 2022-10-30 NOTE — TOC Progression Note (Signed)
Transition of Care [facility]) - Progression Note    Patient Details  Name: [name] MRN: [id number] Date of Birth: 1946/05/19  Transition of Care [facility]) CM/SW Contact  [name], RN Phone Number: 10/30/2022, 12:40 PM  Clinical Narrative:   Spoke with the patient, she is confused, she stated that she is wanting to get out of here and get back to work at the town of [place], I called her husband Fayrene Fearing to review bed offer and left a VM asking for a call back    Expected Discharge Plan: Home/Self Care Barriers to Discharge: Continued Medical Work up  Expected Discharge Plan and Services   Discharge Planning Services: CM Consult   Living arrangements for the past 2 months: Single Family Home                 DME Arranged: N/A DME Agency: NA       HH Arranged: NA HH Agency: NA         Social Determinants of Health (SDOH) Interventions SDOH Screenings   Food Insecurity: No Food Insecurity (10/27/2022)  Housing: Low Risk  (10/27/2022)  Transportation Needs: No Transportation Needs (10/27/2022)  Utilities: Not At Risk (10/27/2022)  Depression (PHQ2-9): Low Risk  (10/01/2022)  Tobacco Use: Low Risk  (10/27/2022)    Readmission Risk Interventions     No data to display

## 2022-10-30 NOTE — Plan of Care (Signed)

## 2022-10-30 NOTE — Care Management Important Message (Signed)
Important Message  Patient Details  Name: [name] [name] MRN: [id number] Date of Birth: 01-Jun-1946   Medicare Important Message Given:  Yes     [name] A Mildreth Reek 10/30/2022, 1:35 PM

## 2022-10-30 NOTE — Progress Notes (Signed)
PHARMACIST - PHYSICIAN COMMUNICATION  CONCERNING:  Enoxaparin (Lovenox) for DVT Prophylaxis    RECOMMENDATION: Patient was prescribed enoxaprin 30mg  q24 hours for VTE prophylaxis.   Filed Weights   10/27/22 1204 10/27/22 1840  Weight: 90.7 kg (200 lb) 100.2 kg (220 lb 14.4 oz)    Body mass index is 36.76 kg/m.  Estimated Creatinine Clearance: 40.3 mL/min (A) (by C-G formula based on SCr of 1.37 mg/dL (H)).   Based on [facility] policy patient is candidate for enoxaparin 0.5mg /kg TBW SQ every 24 hours based on BMI being >30.  DESCRIPTION: Pharmacy has adjusted enoxaparin dose per [facility] policy.  Patient is now receiving enoxaparin 50 mg every 24 hours    Barrie Folk, PharmD Clinical Pharmacist  10/30/2022 11:40 AM

## 2022-10-31 DIAGNOSIS — N1832 Chronic kidney disease, stage 3b: Secondary | ICD-10-CM

## 2022-10-31 DIAGNOSIS — S069X0S Unspecified intracranial injury without loss of consciousness, sequela: Secondary | ICD-10-CM | POA: Diagnosis not present

## 2022-10-31 DIAGNOSIS — I5032 Chronic diastolic (congestive) heart failure: Secondary | ICD-10-CM | POA: Diagnosis not present

## 2022-10-31 DIAGNOSIS — I878 Other specified disorders of veins: Secondary | ICD-10-CM

## 2022-10-31 DIAGNOSIS — F068 Other specified mental disorders due to known physiological condition: Secondary | ICD-10-CM | POA: Diagnosis not present

## 2022-10-31 NOTE — TOC Progression Note (Addendum)
Transition of Care [facility]) - Progression Note    Patient Details  Name: [name] MRN: [id number] Date of Birth: Dec 12, 1945  Transition of Care [facility]) CM/SW Contact  Liliana Cline, LCSW Phone Number: 10/31/2022, 9:39 AM  Clinical Narrative:    Attempted call to spouse to discuss bed offers. Left VM requesting return call.   1:25: Call to sister who is at bedside. Presented bed offers ([facility], [facility], [facility]). Patient chose [facility]. Notified Tanya in Admissions. She stated they could take patient on Monday if medically ready.   Expected Discharge Plan: Home/Self Care Barriers to Discharge: Continued Medical Work up  Expected Discharge Plan and Services   Discharge Planning Services: CM Consult   Living arrangements for the past 2 months: Single Family Home                 DME Arranged: N/A DME Agency: NA       HH Arranged: NA HH Agency: NA         Social Determinants of Health (SDOH) Interventions SDOH Screenings   Food Insecurity: No Food Insecurity (10/27/2022)  Housing: Low Risk  (10/27/2022)  Transportation Needs: No Transportation Needs (10/27/2022)  Utilities: Not At Risk (10/27/2022)  Depression (PHQ2-9): Low Risk  (10/01/2022)  Tobacco Use: Low Risk  (10/27/2022)    Readmission Risk Interventions     No data to display

## 2022-10-31 NOTE — Plan of Care (Addendum)
Patient has been very anxious and has been chanting over and over again " Im [name] I'm in a hospital, I'm safe." When trying to speak to her while she is chanting this, she says "Stop, I wont come back." And continues to go back to chanting.   Problem: Education: Goal: Knowledge of General Education information will improve Description: Including pain rating scale, medication(s)/side effects and non-pharmacologic comfort measures Outcome: Progressing   Problem: Health Behavior/Discharge Planning: Goal: Ability to manage health-related needs will improve Outcome: Progressing   Problem: Clinical Measurements: Goal: Ability to maintain clinical measurements within normal limits will improve Outcome: Progressing Goal: Will remain free from infection Outcome: Progressing Goal: Diagnostic test results will improve Outcome: Progressing Goal: Respiratory complications will improve Outcome: Progressing Goal: Cardiovascular complication will be avoided Outcome: Progressing   Problem: Activity: Goal: Risk for activity intolerance will decrease Outcome: Progressing   Problem: Nutrition: Goal: Adequate nutrition will be maintained Outcome: Progressing   Problem: Coping: Goal: Level of anxiety will decrease Outcome: Progressing   Problem: Elimination: Goal: Will not experience complications related to bowel motility Outcome: Progressing Goal: Will not experience complications related to urinary retention Outcome: Progressing   Problem: Pain Managment: Goal: General experience of comfort will improve Outcome: Progressing   Problem: Safety: Goal: Ability to remain free from injury will improve Outcome: Progressing   Problem: Skin Integrity: Goal: Risk for impaired skin integrity will decrease Outcome: Progressing

## 2022-10-31 NOTE — Progress Notes (Signed)
PROGRESS NOTE    [name] [name]  ZOX:[id number] DOB: [birth year] DOA: 10/27/2022 PCP: Lauro Regulus, MD    Brief Narrative:  77 year old with history of multiple medical issues including chronic diastolic heart failure, CKD stage IIIb, GERD, hypertension, hypothyroidism, depression, cognitive impairment brought to the ER with acute on chronic renal disease.  Patient does have venous ulcers of the lower extremities and followed at [facility] clinic wound care, she was also on multiple diuretics, she was given additional Zaroxolyn as well as doxycycline last week.  Weak, shaking with standing so called EMS.  In the emergency room hemodynamically stable.  Found to have significantly elevated creatinine.   Assessment & Plan:   Acute on chronic kidney disease stage IIIb, baseline creatinine around 1.5. This is likely due to poor oral intake, use of Lasix, spironolactone and Zaroxolyn in addition.  Improving and stable without additional IV fluids. She is using Unna boot, no significant leg edema now.   Started on spironolactone.   Monitor BMP  Weakness, deconditioning: Overall weak.  No focal deficit.  CT head normal limits.  This is multifactorial.  Work with PT OT.  Refer to SNF.  Bilateral leg venous stasis ulcers and swelling: Completed doxycycline therapy. Seen by wound care.  Unna boots placed.  Follow-up at [facility] clinic for next [employer]. She currently has compressed legs, will avoid further diuretics.  Hypokalemia: Replaced.  Magnesium is adequate.  Essential hypertension: Blood pressure stable GERD: On PPI Chronic diastolic heart failure: Euvolemic.  Dehydrated.  Holding Aldactone and Lasix.  Monitor for fluid overload. Hypothyroidism: On Synthroid.  Continue. Chronic anxiety disorder: On Effexor.  Hospital-acquired delirium: Patient had developed delirium while in the hospital and now clearing up.  Avoid polypharmacy.  Will avoid any benzodiazepines.   Regulate day and night cycle.  Continue to mobilize.  Patient had poor mobility.    Dispo: SNF when bed available.   DVT prophylaxis: SCDs Start: 10/27/22 1445   Code Status: Full code Family Communication: Spoke with sister by phone in the room on rounds today  Disposition Plan: Status is: Inpatient Remains inpatient appropriate because: Unsafe discharge disposition.     Consultants:  None  Procedures:  None  Antimicrobials:  None   Subjective:  Patient seen and examined. Nursing staff report ongoing anxiety, pt reportedly refused care from team overnight and earlier this AM.  She was found on a bed pan for unknown length of time.  Currently denies pain of her bottom.  Spoke with her sister on room phone at patient's request, she agrees pt needs to go to rehab.  Pt is from home with husband who cannot care for her or himself.  Staff report pt very anxious, frequently chanting to herself her own name, being in hospital and being safe.  She only reports being cold for me, requests another blanket.  States Unna boots not bothering her.   Objective: Vitals:   10/30/22 1524 10/31/22 0026 10/31/22 0808 10/31/22 1140  BP: 135/66 131/61 (!) 157/70 (!) 152/61  Pulse: 76 79 79 72  Resp: 18 18 18  (!) 22  Temp: 97.8 F (36.6 C) 99.2 F (37.3 C) 98.3 F (36.8 C)   TempSrc:   Oral   SpO2: 97% 95% 100% 99%  Weight:      Height:        Intake/Output Summary (Last 24 hours) at 10/31/2022 1312 Last data filed at 10/31/2022 0810 Gross per 24 hour  Intake 240 ml  Output 1600 ml  Net -1360 ml   Filed Weights   10/27/22 1204 10/27/22 1840  Weight: 90.7 kg 100.2 kg    Examination:  General exam: awake, alert, no acute distress, obese HEENT: moist mucus membranes, hearing grossly normal  Respiratory system: CTAB, no wheezes, rales or rhonchi, normal respiratory effort. Cardiovascular system: normal S1/S2, RRR, Unna boots on BLE's   Gastrointestinal system: soft, NT, ND, no  HSM felt, +bowel sounds. Central nervous system: A&O x 2+. no gross focal neurologic deficits, normal speech Skin: Chronic venous stasis changes of BLE's. Small early ecchymosis of ischial tuberosity and erythema streak mark where edge of bedpan was pressing, no skin breakdown seen on visualized area Extremities: No edema of the legs with bilateral Unna boots in place.   Psychiatry: anxious mood, congruent affect, judgement and insight appear normal    Data Reviewed: I have personally reviewed following labs and imaging studies  CBC: Recent Labs  Lab 10/27/22 1209 10/28/22 0439 10/29/22 0540  WBC 6.7 8.1 7.0  NEUTROABS  --   --  3.7  HGB 12.4 11.3* 10.4*  HCT 38.0 33.0* 31.5*  MCV 97.4 92.7 95.7  PLT 174 248 214   Basic Metabolic Panel: Recent Labs  Lab 10/27/22 1209 10/27/22 2057 10/27/22 2202 10/28/22 0439 10/29/22 0540 10/30/22 0520  NA 134* 138  --  138 138 141  K 3.3* 3.0*  --  3.0* 3.8 3.9  CL 96* 97*  --  103 108 112*  CO2 21* 28  --  24 24 21*  GLUCOSE 97 123*  --  91 99 97  BUN 105* 99*  --  88* 61* 37*  CREATININE 3.70* 3.07*  --  2.25* 1.55* 1.37*  CALCIUM 9.9 10.0  --  9.7 9.6 10.8*  MG  --   --  2.5*  --  2.4  --    GFR: Estimated Creatinine Clearance: 40.3 mL/min (A) (by C-G formula based on SCr of 1.37 mg/dL (H)). Liver Function Tests: Recent Labs  Lab 10/28/22 0439  AST 14*  ALT 13  ALKPHOS 69  BILITOT 1.1  PROT 6.5  ALBUMIN 3.6   No results for input(s): "LIPASE", "AMYLASE" in the last 168 hours. No results for input(s): "AMMONIA" in the last 168 hours. Coagulation Profile: No results for input(s): "INR", "PROTIME" in the last 168 hours. Cardiac Enzymes: No results for input(s): "CKTOTAL", "CKMB", "CKMBINDEX", "TROPONINI" in the last 168 hours. BNP (last 3 results) No results for input(s): "PROBNP" in the last 8760 hours. HbA1C: No results for input(s): "HGBA1C" in the last 72 hours. CBG: Recent Labs  Lab 10/28/22 0811  GLUCAP 95    Lipid Profile: No results for input(s): "CHOL", "HDL", "LDLCALC", "TRIG", "CHOLHDL", "LDLDIRECT" in the last 72 hours. Thyroid Function Tests: No results for input(s): "TSH", "T4TOTAL", "FREET4", "T3FREE", "THYROIDAB" in the last 72 hours. Anemia Panel: No results for input(s): "VITAMINB12", "FOLATE", "FERRITIN", "TIBC", "IRON", "RETICCTPCT" in the last 72 hours. Sepsis Labs: No results for input(s): "PROCALCITON", "LATICACIDVEN" in the last 168 hours.  No results found for this or any previous visit (from the past 240 hour(s)).       Radiology Studies: No results found.      Scheduled Meds:  amantadine  100 mg Oral Daily   amLODipine  5 mg Oral Daily   carvedilol  50 mg Oral BID WC   donepezil  10 mg Oral Daily   enoxaparin (LOVENOX) injection  0.5 mg/kg Subcutaneous Q24H   folic acid  500 mcg Oral Daily  levothyroxine  112 mcg Oral QAC breakfast   melatonin  2.5 mg Oral QHS   montelukast  10 mg Oral QPM   multivitamin with minerals  1 tablet Oral Daily   pantoprazole  40 mg Oral Daily   topiramate  25 mg Oral Daily   traZODone  50 mg Oral QHS   venlafaxine XR  225 mg Oral Daily   Continuous Infusions:     LOS: 4 days    Time spent: 45 minutes    Pennie Banter, DO [facility] Pager [phone]

## 2022-11-01 DIAGNOSIS — I5032 Chronic diastolic (congestive) heart failure: Secondary | ICD-10-CM | POA: Diagnosis not present

## 2022-11-01 DIAGNOSIS — I878 Other specified disorders of veins: Secondary | ICD-10-CM | POA: Diagnosis not present

## 2022-11-01 DIAGNOSIS — R531 Weakness: Secondary | ICD-10-CM | POA: Diagnosis not present

## 2022-11-01 DIAGNOSIS — F068 Other specified mental disorders due to known physiological condition: Secondary | ICD-10-CM | POA: Diagnosis not present

## 2022-11-01 LAB — BASIC METABOLIC PANEL
Anion gap: 7 (ref 5–15)
BUN: 27 mg/dL — ABNORMAL HIGH (ref 8–23)
CO2: 20 mmol/L — ABNORMAL LOW (ref 22–32)
Calcium: 10.8 mg/dL — ABNORMAL HIGH (ref 8.9–10.3)
Chloride: 109 mmol/L (ref 98–111)
Creatinine, Ser: 1.11 mg/dL — ABNORMAL HIGH (ref 0.44–1.00)
GFR, Estimated: 51 mL/min — ABNORMAL LOW (ref >60)
Glucose, Bld: 90 mg/dL (ref 70–99)
Potassium: 3.7 mmol/L (ref 3.5–5.1)
Sodium: 136 mmol/L (ref 135–145)

## 2022-11-01 MED ORDER — TORSEMIDE 20 MG PO TABS
20.0000 mg | ORAL_TABLET | Freq: Every day | ORAL | Status: DC
  Administered 2022-11-02: 20 mg via ORAL
  Filled 2022-11-01: qty 1

## 2022-11-01 NOTE — Plan of Care (Signed)

## 2022-11-01 NOTE — Progress Notes (Addendum)
PROGRESS NOTE    [name] [name]  ZOX:[id number] DOB: [birth year] DOA: 10/27/2022 PCP: Lauro Regulus, MD    Brief Narrative:  77 year old with history of multiple medical issues including chronic diastolic heart failure, CKD stage IIIb, GERD, hypertension, hypothyroidism, depression, cognitive impairment brought to the ER with acute on chronic renal disease.  Patient does have venous ulcers of the lower extremities and followed at [facility] clinic wound care, she was also on multiple diuretics, she was given additional Zaroxolyn as well as doxycycline last week.  Weak, shaking with standing so called EMS.  In the emergency room hemodynamically stable.  Found to have significantly elevated creatinine.   Assessment & Plan:   Acute on chronic kidney disease stage IIIb, baseline creatinine around 1.5.  AKI now resolved. This is likely due to poor oral intake, use of Lasix, spironolactone and Zaroxolyn in addition.  Improving and stable without additional IV fluids. Monitor BMP Cr 1.11 today better than baseline.  Weakness, deconditioning: Overall weak.  No focal deficit.  CT head normal limits.  This is multifactorial.   Continue PT OT.   SNF/rehab placement pending.  Bilateral leg venous stasis ulcers and swelling: Completed doxycycline therapy. Seen by wound care.  Unna boots placed.  Follow-up at [facility] clinic for next [employer]. Now with Unna boots on BLE's for compression therapy, avoiding diuretics at this time given severe AKI on admission.  Daily weights to monitor volume status  Hypokalemia: Replaced.   Mg level normal.  Essential hypertension: Blood pressure stable on Coreg, amlodipine  GERD: On PPI  Chronic diastolic heart failure: Euvolemic.  Dehydrated.   Holding Aldactone and torsemide.   Continue compression therapy with Unna boots for LE edema. Daily weights - monitor volume status Resume lower dose torsemide 20 mg daily tomorrow  (5/20)  Hypothyroidism: On Synthroid.  Continue.  Chronic anxiety disorder: On Effexor.  PRN low dose Klonopin started, seems tolerating well and helpful.  Hospital-acquired delirium: Patient had developed delirium while in the hospital and now clearing up.   Avoid polypharmacy as much as possible.  Severe anxiety (causing pt to refuse care from nursing staff) has improved with low dose Klonopin, will continue for now and monitor closely.   Regulate day and night cycle. Delirium precautions.  Intention tremor - chronic, progressive per patient. Pt on Topamax for unclear indication, but this is used for tremors.  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   DVT prophylaxis: SCDs Start: 10/27/22 1445   Code Status: Full code Family Communication: None present, pt declined for me to call family today.  Disposition Plan: Status is: Inpatient Remains inpatient appropriate because: Requires SNF/rehab placement.     Consultants:  None  Procedures:  None  Antimicrobials:  None   Subjective:  Patient awake sitting up in bed this AM.  She reports only having one anxiety attack this morning, so far, overall improved.  She reports shaking in her hands is chronic but getting worse and interferes with writing and eating.  No other acute complaints.     Objective: Vitals:   10/31/22 1758 10/31/22 2315 11/01/22 0748 11/01/22 0956  BP: (!) 142/66 (!) 151/65 (!) 159/81 (!) 138/54  Pulse: 68 67 73 72  Resp: 18 19 18 20   Temp: 98 F (36.7 C) 98.1 F (36.7 C) 98.1 F (36.7 C)  98.2 F (36.8 C)  TempSrc:      SpO2: 100% 100% 99% 96%  Weight:      Height:        Intake/Output Summary (Last 24 hours) at 11/01/2022 1252 Last data filed at 11/01/2022 1010 Gross per 24 hour  Intake --  Output 1750 ml   Net -1750 ml   Filed Weights   10/27/22 1204 10/27/22 1840  Weight: 90.7 kg 100.2 kg    Examination:  General exam: awake, alert, no acute distress, obese Respiratory system: CTAB, no wheezes, rales or rhonchi, normal respiratory effort. Cardiovascular system: normal S1/S2, RRR, Unna boots on BLE's   Gastrointestinal system: soft, NT, ND Central nervous system: A&O x 2+. no gross focal neurologic deficits, normal speech Skin: Chronic venous stasis changes of BLE's. Small early ecchymosis of ischial tuberosity and erythema streak mark where edge of bedpan was pressing, no skin breakdown seen on visualized area Extremities: intention tremors in b/l hands, bilateral Unna boots on LE's.   Psychiatry: anxious mood, congruent affect, judgement and insight appear normal    Data Reviewed: I have personally reviewed following labs and imaging studies  CBC: Recent Labs  Lab 10/27/22 1209 10/28/22 0439 10/29/22 0540  WBC 6.7 8.1 7.0  NEUTROABS  --   --  3.7  HGB 12.4 11.3* 10.4*  HCT 38.0 33.0* 31.5*  MCV 97.4 92.7 95.7  PLT 174 248 214   Basic Metabolic Panel: Recent Labs  Lab 10/27/22 2057 10/27/22 2202 10/28/22 0439 10/29/22 0540 10/30/22 0520 11/01/22 0426  NA 138  --  138 138 141 136  K 3.0*  --  3.0* 3.8 3.9 3.7  CL 97*  --  103 108 112* 109  CO2 28  --  24 24 21* 20*  GLUCOSE 123*  --  91 99 97 90  BUN 99*  --  88* 61* 37* 27*  CREATININE 3.07*  --  2.25* 1.55* 1.37* 1.11*  CALCIUM 10.0  --  9.7 9.6 10.8* 10.8*  MG  --  2.5*  --  2.4  --   --    GFR: Estimated Creatinine Clearance: 49.8 mL/min (A) (by C-G formula based on SCr of 1.11 mg/dL (H)). Liver Function Tests: Recent Labs  Lab 10/28/22 0439  AST 14*  ALT 13  ALKPHOS 69  BILITOT 1.1  PROT 6.5  ALBUMIN 3.6   No results for input(s): "LIPASE", "AMYLASE" in the last 168 hours. No results for input(s): "AMMONIA" in the last 168 hours. Coagulation Profile: No results for input(s): "INR", "PROTIME"  in the last 168 hours. Cardiac Enzymes: No results for input(s): "CKTOTAL", "CKMB", "CKMBINDEX", "TROPONINI" in the last 168 hours. BNP (last 3 results) No results for input(s): "PROBNP" in the last 8760 hours. HbA1C: No results for input(s): "HGBA1C" in the last 72 hours. CBG: Recent Labs  Lab 10/28/22 0811  GLUCAP 95   Lipid Profile: No results for input(s): "CHOL", "HDL", "LDLCALC", "TRIG", "CHOLHDL", "LDLDIRECT" in the last 72 hours. Thyroid Function Tests: No results for input(s): "TSH", "T4TOTAL", "FREET4", "T3FREE", "THYROIDAB" in the last 72 hours. Anemia Panel: No results for input(s): "VITAMINB12", "FOLATE", "FERRITIN", "TIBC", "IRON", "RETICCTPCT" in the last 72 hours. Sepsis Labs: No results for input(s): "PROCALCITON", "LATICACIDVEN" in the last 168 hours.  No results found for this or any previous visit (from the past 240 hour(s)).       Radiology Studies: No results found.      Scheduled Meds:  amantadine  100 mg Oral Daily  amLODipine  5 mg Oral Daily   carvedilol  50 mg Oral BID WC   donepezil  10 mg Oral Daily   enoxaparin (LOVENOX) injection  0.5 mg/kg Subcutaneous Q24H   folic acid  500 mcg Oral Daily   levothyroxine  112 mcg Oral QAC breakfast   melatonin  2.5 mg Oral QHS   montelukast  10 mg Oral QPM   multivitamin with minerals  1 tablet Oral Daily   pantoprazole  40 mg Oral Daily   topiramate  25 mg Oral Daily   traZODone  50 mg Oral QHS   venlafaxine XR  225 mg Oral Daily   Continuous Infusions:     LOS: 5 days    Time spent: 38 minutes    Pennie Banter, DO [facility] Pager [phone]

## 2022-11-01 NOTE — Consult Note (Signed)
WOC Nurse Consult Note: Reason for Consult:pressure injury, partial thickness to bilateral buttocks related to medical device (bedpan), stage 2 Wound type:pressure Pressure Injury POA: No Measurement: Left buttock (stage 2): 18cm x 12cm x 0.1cm Right buttock (stage 2): 17cm x 19cm x 0.1cm Wound bed:red, moist Drainage (amount, consistency, odor) None Periwound:intact Dressing procedure/placement/frequency: Patient is being turned and repositioned. Topical care will be with daily cleansing followed by covering lesions with folded layer of antimicrobial nonadherent, xeroform gauze. This is to be topped with dry gauze and covered with silicone foam dressings. Changes are to be daily and PRN soiling or dressing dislodgement.  A sacral foam is to be placed and Prevalon boots used for PI prevention.  A pressure redistribution chair cushion is provided for the patient's use downstream at SNF/Rehab facility.  Unna's boots are in place and are changed weekly. Next scheduled change is 11/04/22 and WOC nursing will change if patient still in house.   WOC nursing team will follow, and will remain available to this patient, the nursing and medical teams.   Thank you for inviting [name] to participate in this patient's Plan of Care.  Ladona Mow, MSN, RN, CNS, GNP, Leda Min, [employer], [employer] phone:  [phone]

## 2022-11-02 DIAGNOSIS — I5032 Chronic diastolic (congestive) heart failure: Secondary | ICD-10-CM | POA: Diagnosis not present

## 2022-11-02 DIAGNOSIS — N179 Acute kidney failure, unspecified: Secondary | ICD-10-CM | POA: Diagnosis not present

## 2022-11-02 DIAGNOSIS — I878 Other specified disorders of veins: Secondary | ICD-10-CM | POA: Diagnosis not present

## 2022-11-02 DIAGNOSIS — R531 Weakness: Secondary | ICD-10-CM | POA: Diagnosis not present

## 2022-11-02 LAB — BASIC METABOLIC PANEL
Anion gap: 7 (ref 5–15)
BUN: 30 mg/dL — ABNORMAL HIGH (ref 8–23)
CO2: 21 mmol/L — ABNORMAL LOW (ref 22–32)
Calcium: 10.2 mg/dL (ref 8.9–10.3)
Chloride: 108 mmol/L (ref 98–111)
Creatinine, Ser: 1.28 mg/dL — ABNORMAL HIGH (ref 0.44–1.00)
GFR, Estimated: 43 mL/min — ABNORMAL LOW (ref >60)
Glucose, Bld: 92 mg/dL (ref 70–99)
Potassium: 3.6 mmol/L (ref 3.5–5.1)
Sodium: 136 mmol/L (ref 135–145)

## 2022-11-02 MED ORDER — CLONAZEPAM 0.5 MG PO TABS: 0.5000 mg | ORAL_TABLET | Freq: Two times a day (BID) | ORAL | 0 refills | Status: DC | PRN

## 2022-11-02 MED ORDER — ORAL CARE MOUTH RINSE: 15.0000 mL | OROMUCOSAL | Status: DC | PRN

## 2022-11-02 MED ORDER — AMANTADINE HCL 100 MG PO CAPS: 100.0000 mg | ORAL_CAPSULE | Freq: Every day | ORAL | Status: DC

## 2022-11-02 MED ORDER — TRAMADOL HCL 50 MG PO TABS: 50.0000 mg | ORAL_TABLET | Freq: Two times a day (BID) | ORAL | 0 refills | Status: AC | PRN

## 2022-11-02 NOTE — Discharge Summary (Addendum)
Physician Discharge Summary   Patient: [name] MRN: [id number] DOB: 1946/04/22  Admit date:     10/27/2022  Discharge date: 11/02/22  Discharge Physician: Pennie Banter   PCP: Lauro Regulus, MD   Recommendations at discharge:    Follow up with Primary Care in 1 week Repeat CBC, BMP, Mg in 1 week, and 1-2 weeks thereafter Maintain  Unna boots and change weekly, next 11/04/22 Continue wound care to bottom daily, per instructions included in after-visit summary and below   Discharge Diagnoses: Principal Problem:   Acute renal failure superimposed on stage 3b chronic kidney disease (HCC) Active Problems:   Weakness   Acquired hypothyroidism   Chronic diastolic CHF (congestive heart failure) (HCC)   Cognitive deficit as late effect of traumatic brain injury (HCC)   GERD (gastroesophageal reflux disease)   HTN, goal below 140/90   Major depression in remission (HCC)   Venous stasis  Resolved Problems:   * No resolved hospital problems. *  Hospital Course: 77 year old with history of multiple medical issues including chronic diastolic heart failure, CKD stage IIIb, GERD, hypertension, hypothyroidism, depression, cognitive impairment brought to the ER with acute on chronic renal disease. Patient does have venous ulcers of the lower extremities and followed at [facility] clinic wound care, she was also on multiple diuretics, she was given additional Zaroxolyn as well as doxycycline last week. Weak, shaking with standing so called EMS. In the emergency room hemodynamically stable. Found to have significantly elevated creatinine.   Admitted for acute renal failure. Further hospital course and management as outlined below.   11/02/22 -- pt doing well.  Fewer anxiety episodes, tolerating medication without side effects.  She denies complaints. Medically stable to discharge to SNF/rehab today.   Assessment and Plan:  Acute on chronic kidney disease stage  IIIb, baseline creatinine around 1.5.  AKI now resolved. This is likely due to poor oral intake, use of Lasix, spironolactone and Zaroxolyn in addition.  Improving and stable without additional IV fluids. Monitor BMP Cr 1.28 today better than baseline. --Repeat BMP within 1 week   Weakness, deconditioning: Overall weak.  No focal deficit.  CT head normal limits.  This is multifactorial.   --Continue PT OT.   --SNF/rehab at discharge. --Fall precautions.   Bilateral leg venous stasis ulcers and swelling: Completed doxycycline therapy. Seen by wound care.   --Unna boots placed - change weekly - next change due 11/04/22.Marland Kitchen  --Follow-up at [facility] clinic for next [employer].   Hypokalemia: Replaced.   Mg level normal.  Chronic diastolic heart failure: Euvolemic.   Dehydrated on admission.   Aldactone, torsemide and metolazone were held. --Continue compression therapy with Unna boots for LE edema. --Daily weights - monitor volume status --Resumed lower dose torsemide 20 mg daily  --Resume Aldactone at discharge --Repeat BMP within 1 week   Hospital-acquired delirium: Patient had developed delirium while in the hospital, now seems resolved.   --Avoid polypharmacy as much as possible.   --Severe anxiety has responded well to low dose Klonopin, will continue for now.  Advise close follow up and discontinue as soon as possible. --Regulate day and night cycle. --Delirium precautions.   Essential hypertension: Blood pressure stable on Coreg, amlodipine   GERD: On PPI   Hypothyroidism: On Synthroid.  Continue.   Chronic anxiety disorder:  --On Effexor.   --PRN low dose Klonopin started, seems tolerating well and helpful.   Intention tremor - chronic, progressive per patient. Pt on Topamax for unclear  indication, but this is used for tremors.  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 Wound Care -- --Patient to be turned and repositioned.  --Topical care will be with daily cleansing followed by covering lesions with folded layer of antimicrobial nonadherent, xeroform gauze. This is to be topped with dry gauze and covered with silicone foam dressings. Changes are to be daily and PRN soiling or dressing dislodgement. --A sacral foam is to be placed and Prevalon boots used for PI prevention. --A pressure redistribution chair cushion is provided for the patient's use downstream at SNF/Rehab facility. .  Pressure Injury 10/31/22 Buttocks Left Stage 2 -  Partial thickness loss of dermis presenting as a shallow open injury with a red, pink wound bed without slough. (Active)  10/31/22 0730  Location: Buttocks  Location Orientation: Left  Staging: Stage 2 -  Partial thickness loss of dermis presenting as a shallow open injury with a red, pink wound bed without slough.  Wound Description (Comments):   Present on Admission: No     Pressure Injury 10/31/22 Buttocks Right Stage 2 -  Partial thickness loss of dermis presenting as a shallow open injury with a red, pink wound bed without slough. (Active)  10/31/22 0730  Location: Buttocks  Location Orientation: Right  Staging: Stage 2 -  Partial thickness loss of dermis presenting as a shallow open injury with a red, pink wound bed without slough.  Wound Description (Comments):   Present on Admission:             Consultants: Wound care  Procedures performed: None   Disposition: Skilled nursing facility  Diet recommendation:  Discharge Diet Orders (From admission, onward)     Start     Ordered   11/02/22 0000  Diet - low sodium heart healthy        11/02/22 1129           Cardiac diet DISCHARGE MEDICATION: Allergies as of 11/02/2022       Reactions   Codeine Other  (See Comments)   Seizure. Tolerates tramadol   Povidone Iodine Shortness Of Breath   Patient had difficulty breathing during procedure where dura prep was used to clean back.    Hydromorphone Hives, Itching, Other (See Comments)   Severe itching hallucinations   Latex Itching, Other (See Comments)   blisters   Oxycodone-acetaminophen Nausea And Vomiting, Itching   Sulfa Antibiotics Itching, Swelling   Swelling eyes, hands, feet. Was at home and didn't go to the hospital   Bupropion Other (See Comments)   Gabapentin Other (See Comments)   tremors   Hydrocodone    Tolerates tramadol   Morphine And Codeine Itching, Rash   Procaine         Medication List     STOP taking these medications    metolazone 2.5 MG tablet Commonly known as: ZAROXOLYN   potassium chloride SA 20 MEQ tablet Commonly known as: KLOR-CON M       TAKE these medications    acetaminophen 500 MG tablet Commonly known as: TYLENOL Take 500 mg by mouth every 6 (six) hours as needed.   albuterol 0.63 MG/3ML nebulizer solution Commonly known as: ACCUNEB albuterol sulfate HFA 90 mcg/actuation aerosol inhaler  INHALE 2 INHALATIONS INTO THE LUNGS EVERY 6 HOURS AS NEEDED FOR WHEEZING  amantadine 100 MG capsule Commonly known as: SYMMETREL Take 1 capsule (100 mg total) by mouth daily. Start taking on: Nov 03, 2022 What changed: when to take this   amLODipine 5 MG tablet Commonly known as: NORVASC Take 5 mg by mouth daily.   baclofen 20 MG tablet Commonly known as: LIORESAL Take 20 mg by mouth daily as needed for muscle spasms.   Biotin 10 MG Caps Take by mouth daily.   carvedilol 25 MG tablet Commonly known as: COREG Take 50 mg by mouth 2 (two) times daily with a meal.   clonazePAM 0.5 MG tablet Commonly known as: KLONOPIN Take 1 tablet (0.5 mg total) by mouth 2 (two) times daily as needed (anxiety).   donepezil 10 MG tablet Commonly known as: ARICEPT Take 10 mg by mouth daily.    esomeprazole 40 MG capsule Commonly known as: NEXIUM Take 40 mg by mouth daily.   fluticasone 50 MCG/ACT nasal spray Commonly known as: FLONASE Place 1 spray into both nostrils daily as needed for rhinitis or allergies.   fluticasone furoate-vilanterol 100-25 MCG/INH Aepb Commonly known as: BREO ELLIPTA Inhale 1 puff into the lungs daily. What changed:  when to take this reasons to take this   folic acid 400 MCG tablet Commonly known as: FOLVITE Take 400 mcg by mouth daily.   levothyroxine 112 MCG tablet Commonly known as: SYNTHROID Take 112 mcg by mouth daily.   losartan 100 MG tablet Commonly known as: COZAAR Take 100 mg by mouth daily.   magnesium 30 MG tablet Take 30 mg by mouth daily.   melatonin 3 MG Tabs tablet Take 1 tablet at bedtime by mouth.   montelukast 10 MG tablet Commonly known as: SINGULAIR Take 10 mg by mouth every evening.   multivitamin tablet Take 1 tablet by mouth daily.   nystatin powder Commonly known as: MYCOSTATIN/NYSTOP Apply topically 2 (two) times daily. What changed:  how much to take when to take this reasons to take this   spironolactone 25 MG tablet Commonly known as: ALDACTONE Take 25 mg by mouth daily.   topiramate 25 MG tablet Commonly known as: TOPAMAX Take 25 mg by mouth daily.   torsemide 20 MG tablet Commonly known as: DEMADEX Take 20 mg by mouth daily. States she is taking 60mg    traMADol 50 MG tablet Commonly known as: ULTRAM Take 1-2 tablets (50-100 mg total) by mouth every 12 (twelve) hours as needed for moderate pain or severe pain. Start taking on: November 14, 2022 What changed: These instructions start on November 14, 2022. If you are unsure what to do until then, ask your doctor or other care provider.   traZODone 50 MG tablet Commonly known as: DESYREL Take 50 mg by mouth at bedtime. 50-150mg  nightly as needed   venlafaxine XR 75 MG 24 hr capsule Commonly known as: EFFEXOR-XR Take 225 mg by mouth  daily.   vitamin C 1000 MG tablet Take 1,000 mg daily by mouth.   Vitamin D3 50 MCG (2000 UT) Tabs Take 2,000 Units daily by mouth.   zinc gluconate 50 MG tablet Take 50 mg by mouth daily.               Discharge Care Instructions  (From admission, onward)           Start     Ordered   11/02/22 0000  Discharge wound care:       Comments: Wound care to bilateral buttock stage 2 pressure injuries:   --  Cleanse with NS< pat dry.  --Cover lesions with folded pieces of xeroform gauze [place] # [id number]),  --top with dry gauze and secure with silicone foam dressings.  --Change daily and PRN soiling or dressing dislodgement.  Unna Boots ---  --change weekly -- next change due 11/04/2022   11/02/22 1129            Contact information for after-discharge care     Destination     [facility] CARE Preferred SNF .   Service: Skilled Nursing Contact information: [address] [place] [address] [phone]                    Discharge Exam: Ceasar Mons Weights   10/27/22 1204 10/27/22 1840 11/02/22 0704  Weight: 90.7 kg 100.2 kg 100.1 kg   General exam: awake, alert, no acute distress, obese HEENT: atraumatic, clear conjunctiva, anicteric sclera, moist mucus membranes, hearing grossly normal  Respiratory system: CTAB, no wheezes, rales or rhonchi, normal respiratory effort. Cardiovascular system: normal S1/S2, RRR, no pedal edema (Unna boots in place).   Gastrointestinal system: soft, NT, ND, no HSM felt, +bowel sounds. Central nervous system: A&O x3. no gross focal neurologic deficits, normal speech Extremities: Unna boots in place on BLE's, normal tone Skin: dry, intact, normal temperature, normal color, No rashes, lesions or ulcers Psychiatry: normal mood, congruent affect, judgement and insight appear normal   Condition at discharge: stable  The results of significant diagnostics from this hospitalization (including imaging,  microbiology, ancillary and laboratory) are listed below for reference.   Imaging Studies: US RENAL  Result Date: 10/28/2022 CLINICAL DATA:  [id number] Acute-on-chronic kidney injury [facility]) [id number] EXAM: RENAL / URINARY TRACT ULTRASOUND COMPLETE COMPARISON:  None Available. FINDINGS: Right Kidney: Renal measurements: 9.7 x 6.0 x 5.8 cm = volume: 178.2 mL. There is a solid echogenic mass measuring 0.8 x 0.7 x 0.8 cm compatible with an angiomyolipoma. No hydronephrosis. Mildly increased renal cortical echogenicity. Left Kidney: Renal measurements: 8.3 x 5.2 x 4.3 cm = volume: 96.5 mL. There is a 1.8 cm simple cyst which requires no follow-up imaging. Increased renal cortical echogenicity. No hydronephrosis. Bladder: Appears normal for degree of bladder distention. Other: Limited study due to body habitus. IMPRESSION: No hydronephrosis. Mildly increased renal cortical echogenicity bilaterally, as can be seen in medical renal disease. 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 This can be further evaluated with non-emergent contrast enhanced CT or MRI as an outpatient. Electronically Signed   By: Caprice Renshaw M.D.   On: 10/28/2022 14:05   DG Chest 2 View  Result Date: 10/27/2022 CLINICAL DATA:  Weakness EXAM: CHEST - 2 VIEW COMPARISON:  CXR 07/02/20 FINDINGS: The heart size and mediastinal contours are within normal limits. Both lungs are clear. Degenerative changes of the bilateral glenohumeral joints. Vertebral body heights are maintained. No radiographically apparent displaced rib fracture. IMPRESSION: No focal airspace opacity. Electronically Signed   By: Lorenza Cambridge M.D.   On: 10/27/2022 13:57   CT HEAD WO CONTRAST ( )  Result Date: 10/27/2022 CLINICAL DATA:  Stroke suspected EXAM: CT HEAD WITHOUT CONTRAST TECHNIQUE: Contiguous axial images were obtained from the base of the skull through the vertex without intravenous contrast. RADIATION DOSE REDUCTION: This exam  was performed according to the departmental dose-optimization program which includes automated exposure control, adjustment of the mA and/or kV according to patient size and/or use of iterative reconstruction technique. COMPARISON:  None Available. FINDINGS: Brain: No evidence of acute  infarction, hemorrhage, hydrocephalus, extra-axial collection or mass lesion/mass effect. Enlarged and partially empty sella. Vascular: No hyperdense vessel or unexpected calcification. Skull: Normal. Negative for fracture or focal lesion. Sinuses/Orbits: No middle ear or mastoid effusion. Polypoid mucosal thickening bilateral sphenoid sinuses. Bilateral lens replacement. Orbits are otherwise unremarkable. Other: None. IMPRESSION: No hemorrhage or CT evidence of an acute cortical infarct Electronically Signed   By: Lorenza Cambridge M.D.   On: 10/27/2022 12:53    Microbiology: Results for orders placed or performed during the hospital encounter of 07/02/20  Resp Panel by RT-PCR (Flu A&B, Covid) Nasopharyngeal Swab     Status: None   Collection Time: 07/02/20  2:29 PM   Specimen: Nasopharyngeal Swab; Nasopharyngeal(NP) swabs in vial transport medium  Result Value Ref Range Status   SARS Coronavirus 2 by RT PCR NEGATIVE NEGATIVE Final    Comment: (NOTE) SARS-CoV-2 target nucleic acids are NOT DETECTED.  The SARS-CoV-2 RNA is generally detectable in upper respiratory specimens during the acute phase of infection. The lowest concentration of SARS-CoV-2 viral copies this assay can detect is 138 copies/mL. A negative result does not preclude SARS-Cov-2 infection and should not be used as the sole basis for treatment or other patient management decisions. A negative result may occur with  improper specimen collection/handling, submission of specimen other than nasopharyngeal swab, presence of viral mutation(s) within the areas targeted by this assay, and inadequate number of viral copies(<138 copies/mL). A negative result  must be combined with clinical observations, patient history, and epidemiological information. The expected result is Negative.  Fact Sheet for Patients:  BloggerCourse.com  Fact Sheet for Healthcare Providers:  SeriousBroker.it  This test is no t yet approved or cleared by the [place] FDA and  has been authorized for detection and/or diagnosis of SARS-CoV-2 by FDA under an Emergency Use Authorization (EUA). This EUA will remain  in effect (meaning this test can be used) for the duration of the COVID-19 declaration under Section 564(b)(1) of the Act, 21 U.S.C.section 360bbb-3(b)(1), unless the authorization is terminated  or revoked sooner.       Influenza A by PCR NEGATIVE NEGATIVE Final   Influenza B by PCR NEGATIVE NEGATIVE Final    Comment: (NOTE) The Xpert Xpress SARS-CoV-2/FLU/RSV plus assay is intended as an aid in the diagnosis of influenza from Nasopharyngeal swab specimens and should not be used as a sole basis for treatment. Nasal washings and aspirates are unacceptable for Xpert Xpress SARS-CoV-2/FLU/RSV testing.  Fact Sheet for Patients: BloggerCourse.com  Fact Sheet for Healthcare Providers: SeriousBroker.it  This test is not yet approved or cleared by the [place] FDA and has been authorized for detection and/or diagnosis of SARS-CoV-2 by FDA under an Emergency Use Authorization (EUA). This EUA will remain in effect (meaning this test can be used) for the duration of the COVID-19 declaration under Section 564(b)(1) of the Act, 21 U.S.C. section 360bbb-3(b)(1), unless the authorization is terminated or revoked.  Performed at [facility], [address] [place], [place] [address]   Culture, blood (Routine X 2) w Reflex to ID Panel     Status: None   Collection Time: 07/02/20  8:59 PM   Specimen: BLOOD  Result Value Ref Range Status    Specimen Description BLOOD LEFT ARM  Final   Special Requests   Final    BOTTLES DRAWN AEROBIC AND ANAEROBIC Blood Culture adequate volume   Culture   Final    NO GROWTH 5 DAYS Performed at [facility], [address] [place], [place] [address]    Report Status 07/07/2020 FINAL  Final  Culture, blood (Routine X 2) w Reflex to ID Panel     Status: None   Collection Time: 07/02/20  9:00 PM   Specimen: BLOOD  Result Value Ref Range Status   Specimen Description BLOOD RIGHT ARM  Final   Special Requests   Final    BOTTLES DRAWN AEROBIC AND ANAEROBIC Blood Culture adequate volume   Culture   Final    NO GROWTH 5 DAYS Performed at [facility], [address] [place], [place] [address]    Report Status 07/07/2020 FINAL  Final    Labs: CBC: Recent Labs  Lab 10/27/22 1209 10/28/22 0439 10/29/22 0540  WBC 6.7 8.1 7.0  NEUTROABS  --   --  3.7  HGB 12.4 11.3* 10.4*  HCT 38.0 33.0* 31.5*  MCV 97.4 92.7 95.7  PLT 174 248 214   Basic Metabolic Panel: Recent Labs  Lab 10/27/22 2202 10/28/22 0439 10/29/22 0540 10/30/22 0520 11/01/22 0426 11/02/22 0440  NA  --  138 138 141 136 136  K  --  3.0* 3.8 3.9 3.7 3.6  CL  --  103 108 112* 109 108  CO2  --  24 24 21* 20* 21*  GLUCOSE  --  91 99 97 90 92  BUN  --  88* 61* 37* 27* 30*  CREATININE  --  2.25* 1.55* 1.37* 1.11* 1.28*  CALCIUM  --  9.7 9.6 10.8* 10.8* 10.2  MG 2.5*  --  2.4  --   --   --    Liver Function Tests: Recent Labs  Lab 10/28/22 0439  AST 14*  ALT 13  ALKPHOS 69  BILITOT 1.1  PROT 6.5  ALBUMIN 3.6   CBG: Recent Labs  Lab 10/28/22 0811  GLUCAP 95    Discharge time spent: less than 30 minutes.  Signed: Pennie Banter, DO [facility] 11/02/2022

## 2022-11-02 NOTE — TOC Progression Note (Addendum)
Transition of Care [facility]) - Progression Note    Patient Details  Name: [name] MRN: [id number] Date of Birth: 09/06/1945  [facility] CM/SW Contact  [name], RN Phone Number: 11/02/2022, 12:01 PM  Clinical Narrative:   Spoke with the patient and explained that we plan to DC today once I get the bed number from the facility [facility] I will let her know, she plans to use EMS to transport, I asked if she wanted me to call family and she stated that she would  Update Going to room 3A I called EMS and arranged transport There is1 ahead of her  Expected Discharge Plan: Home/Self Care Barriers to Discharge: Continued Medical Work up  Expected Discharge Plan and Services   Discharge Planning Services: CM Consult   Living arrangements for the past 2 months: Single Family Home Expected Discharge Date: 11/02/22               DME Arranged: N/A DME Agency: NA       HH Arranged: NA HH Agency: NA         Social Determinants of Health (SDOH) Interventions SDOH Screenings   Food Insecurity: No Food Insecurity (10/27/2022)  Housing: Low Risk  (10/27/2022)  Transportation Needs: No Transportation Needs (10/27/2022)  Utilities: Not At Risk (10/27/2022)  Depression (PHQ2-9): Low Risk  (10/01/2022)  Tobacco Use: Low Risk  (10/27/2022)    Readmission Risk Interventions     No data to display

## 2022-11-02 NOTE — Progress Notes (Signed)
RN attempted to call [facility] to give report, unable to reach anyone at this time.

## 2022-11-02 NOTE — Care Management Important Message (Signed)
Important Message  Patient Details  Name: [name] MRN: [id number] Date of Birth: 1946-03-24   Medicare Important Message Given:  Yes     [name] A [name] 11/02/2022, 11:27 AM

## 2022-11-02 NOTE — Progress Notes (Signed)
Physical Therapy Treatment Patient Details Name: [name] [name] MRN: [id number] DOB: July 19, 1945 Today's Date: 11/02/2022   History of Present Illness [name] is a 77yoF who comes to [facility] on 10/27/22 c acute onset weakness. PMH: dCHF, CKD3, GERD, HTN, hypoTSH, depression, TBI c residual weakness.    PT Comments    Pt in bed on entry, OT session recently finished. Big focus on leg exercises today, still progressively increasing reps. Pt appears a little stronger in some areas, but SLR remains profoundly weak bilat, R > L. Pt remains comfortable throughout and motivated. Keeps track of her own reps when able. Left with HOB at 38 degrees at end of session Will continue to follow.       Recommendations for follow up therapy are one component of a multi-disciplinary discharge planning process, led by the attending physician.  Recommendations may be updated based on patient status, additional functional criteria and insurance authorization.  Follow Up Recommendations  Can patient physically be transported by private vehicle: No    Assistance Recommended at Discharge Frequent or constant Supervision/Assistance  Patient can return home with the following Two people to help with walking and/or transfers;Two people to help with bathing/dressing/bathroom;Assist for transportation;Help with stairs or ramp for entrance   Equipment Recommendations  None recommended by PT    Recommendations for Other Services       Precautions / Restrictions Precautions Precautions: Fall Restrictions Weight Bearing Restrictions: No     Mobility  Bed Mobility               General bed mobility comments: deferred this session    Transfers                        Ambulation/Gait                   Stairs             Wheelchair Mobility    Modified Rankin (Stroke Patients Only)       Balance                                             Cognition Arousal/Alertness: Awake/alert Behavior During Therapy: WFL for tasks assessed/performed, Flat affect Overall Cognitive Status: Within Functional Limits for tasks assessed                                 General Comments: A&Ox4        Exercises General Exercises - Upper Extremity Shoulder Flexion: AROM, Both, 10 reps, Supine Other Exercises Other Exercises: SLR 1x5 LLE c maxA, 1x10 RLE with min-modA Other Exercises: Hooklying marching 1x8 B Other Exercises: SAQ 2x10 bilat Other Exercises: Heel slide up, resisted down x10 bilat Other Exercises: Hip ABDCT/ADD x10    General Comments        Pertinent Vitals/Pain Pain Assessment Pain Assessment: No/denies pain    Home Living                          Prior Function            PT Goals (current goals can now be found in the care plan section) Acute Rehab PT Goals Patient Stated Goal: regain ability to perform household AMB  PT Goal Formulation: With patient Time For Goal Achievement: 11/11/22 Potential to Achieve Goals: Fair Progress towards PT goals: Progressing toward goals;Not progressing toward goals - comment    Frequency    Min 3X/week      PT Plan Current plan remains appropriate    Co-evaluation              AM-PAC PT "6 Clicks" Mobility   Outcome Measure  Help needed turning from your back to your side while in a flat bed without using bedrails?: Total Help needed moving from lying on your back to sitting on the side of a flat bed without using bedrails?: Total Help needed moving to and from a bed to a chair (including a wheelchair)?: Total Help needed standing up from a chair using your arms (e.g., wheelchair or bedside chair)?: Total Help needed to walk in hospital room?: Total Help needed climbing 3-5 steps with a railing? : Total 6 Click Score: 6    End of Session   Activity Tolerance: Patient tolerated treatment well;Patient limited by  fatigue Patient left: in bed;with call bell/phone within reach Nurse Communication: Mobility status PT Visit Diagnosis: Other abnormalities of gait and mobility (R26.89);Muscle weakness (generalized) (M62.81);Unsteadiness on feet (R26.81);Difficulty in walking, not elsewhere classified (R26.2)     Time: 1610-9604 PT Time Calculation (min) (ACUTE ONLY): 20 min  Charges:  $Therapeutic Exercise: 8-22 mins                     11:18 AM, 11/02/22 Rosamaria Lints, PT, DPT Physical Therapist - [facility]  [phone] (ASCOM)     Karder Goodin C 11/02/2022, 11:16 AM

## 2022-11-02 NOTE — Progress Notes (Signed)
Occupational Therapy Treatment Patient Details Name: [name] MRN: [id number] DOB: Nov 15, 1945 Today's Date: 11/02/2022   History of present illness [name] is a 77yoF who comes to [facility] on 10/27/22 c acute onset weakness. PMH: dCHF, CKD3, GERD, HTN, hypoTSH, depression, TBI c residual weakness.   OT comments  Ms [name] was seen for OT treatment on this date. Upon arrival to room pt reclined in bed, agreeable to tx. Pt requires MAX A exit bed, +2 assist to return to bed. MOD A x2 + RW sit<>stand x3 at EOB, posterior lean noted. Unable to take side steps or weight shift in standing. Reviewed bed level and seated HEP. Pt making good progress toward goals, will continue to follow POC. Discharge recommendation remains appropriate.     Recommendations for follow up therapy are one component of a multi-disciplinary discharge planning process, led by the attending physician.  Recommendations may be updated based on patient status, additional functional criteria and insurance authorization.    Assistance Recommended at Discharge Frequent or constant Supervision/Assistance  Patient can return home with the following  Assist for transportation;Two people to help with walking and/or transfers;Two people to help with bathing/dressing/bathroom;Help with stairs or ramp for entrance   Equipment Recommendations  None recommended by OT    Recommendations for Other Services      Precautions / Restrictions Precautions Precautions: Fall Restrictions Weight Bearing Restrictions: No       Mobility Bed Mobility Overal bed mobility: Needs Assistance Bed Mobility: Supine to Sit, Sit to Supine     Supine to sit: Max assist Sit to supine: Max assist, +2 for physical assistance        Transfers Overall transfer level: Needs assistance Equipment used: Rolling walker (2 wheels) Transfers: Sit to/from Stand Sit to Stand: Mod assist, +2 physical assistance                  Balance Overall balance assessment: Needs assistance Sitting-balance support: Feet supported Sitting balance-Leahy Scale: Fair     Standing balance support: Bilateral upper extremity supported Standing balance-Leahy Scale: Poor Standing balance comment: posterior lean                           ADL either performed or assessed with clinical judgement   ADL Overall ADL's : Needs assistance/impaired                                       General ADL Comments: MAX A don/doff prevalon boots at bed level.      Cognition Arousal/Alertness: Awake/alert Behavior During Therapy: WFL for tasks assessed/performed, Flat affect Overall Cognitive Status: Within Functional Limits for tasks assessed                                           Pertinent Vitals/ Pain       Pain Assessment Pain Assessment: Faces Faces Pain Scale: Hurts little more Pain Location: B feet Pain Descriptors / Indicators: Discomfort, Dull Pain Intervention(s): Limited activity within patient's tolerance, Repositioned   Frequency  Min 2X/week        Progress Toward Goals  OT Goals(current goals can now be found in the care plan section)  Progress towards OT goals: Progressing toward goals  Acute Rehab  OT Goals Patient Stated Goal: to get stronger OT Goal Formulation: With patient Time For Goal Achievement: 11/11/22 Potential to Achieve Goals: Fair ADL Goals Pt Will Perform Lower Body Dressing: with min assist;sitting/lateral leans Pt Will Transfer to Toilet: bedside commode;with mod assist;stand pivot transfer Pt Will Perform Tub/Shower Transfer: with mod assist;rolling walker;Stand pivot transfer  Plan Discharge plan remains appropriate;Frequency remains appropriate    Co-evaluation                 AM-PAC OT "6 Clicks" Daily Activity     Outcome Measure   Help from another person eating meals?: A Little Help from another person taking  care of personal grooming?: A Little Help from another person toileting, which includes using toliet, bedpan, or urinal?: A Lot Help from another person bathing (including washing, rinsing, drying)?: A Lot Help from another person to put on and taking off regular upper body clothing?: A Little Help from another person to put on and taking off regular lower body clothing?: A Lot 6 Click Score: 15    End of Session Equipment Utilized During Treatment: Rolling walker (2 wheels)  OT Visit Diagnosis: Muscle weakness (generalized) (M62.81);Other abnormalities of gait and mobility (R26.89);Unsteadiness on feet (R26.81)   Activity Tolerance Patient tolerated treatment well   Patient Left in bed;with call bell/phone within reach;with bed alarm set   Nurse Communication          Time: 1610-9604 OT Time Calculation (min): 21 min  Charges: OT General Charges $OT Visit: 1 Visit OT Treatments $Self Care/Home Management : 8-22 mins  Kathie Dike, M.S. OTR/L  11/02/22, 10:46 AM  ascom [phone]

## 2022-11-11 ENCOUNTER — Ambulatory Visit
Payer: Medicare Other | Attending: Student in an Organized Health Care Education/Training Program | Admitting: Student in an Organized Health Care Education/Training Program

## 2022-11-11 ENCOUNTER — Encounter: Payer: Self-pay | Admitting: Student in an Organized Health Care Education/Training Program

## 2022-11-11 ENCOUNTER — Ambulatory Visit
Admission: RE | Admit: 2022-11-11 | Discharge: 2022-11-11 | Disposition: A | Discharge status: Home / Self Care | Payer: Medicare Other | Source: Ambulatory Visit | Attending: Student in an Organized Health Care Education/Training Program | Admitting: Student in an Organized Health Care Education/Training Program

## 2022-11-11 DIAGNOSIS — M47816 Spondylosis without myelopathy or radiculopathy, lumbar region: Secondary | ICD-10-CM | POA: Diagnosis present

## 2022-11-11 MED ORDER — LIDOCAINE HCL 2 % IJ SOLN
20.0000 mL | Freq: Once | INTRAMUSCULAR | Status: AC
  Administered 2022-11-11: 400 mg
  Filled 2022-11-11: qty 40

## 2022-11-11 MED ORDER — DIAZEPAM 5 MG PO TABS
5.0000 mg | ORAL_TABLET | ORAL | Status: AC
  Administered 2022-11-11: 5 mg via ORAL

## 2022-11-11 MED ORDER — ROPIVACAINE HCL 2 MG/ML IJ SOLN
9.0000 mL | Freq: Once | INTRAMUSCULAR | Status: AC
  Administered 2022-11-11: 9 mL via PERINEURAL
  Filled 2022-11-11: qty 20

## 2022-11-11 MED ORDER — DEXAMETHASONE SODIUM PHOSPHATE 10 MG/ML IJ SOLN
10.0000 mg | Freq: Once | INTRAMUSCULAR | Status: AC
  Administered 2022-11-11: 10 mg
  Filled 2022-11-11: qty 1

## 2022-11-11 MED ORDER — DIAZEPAM 5 MG PO TABS
ORAL_TABLET | ORAL | Status: AC
  Filled 2022-11-11: qty 1

## 2022-11-11 NOTE — Progress Notes (Signed)
Patient's Name: [name]  MRN: [id number]  Referring Provider: Edward Jolly, MD  DOB: Oct 20, 1945  PCP: Lauro Regulus, MD  DOS: 11/11/2022  Note by: Edward Jolly, MD  Service setting: Ambulatory outpatient  Specialty: Interventional Pain Management  Patient type: Established  Location: [facility] (AMB) Pain Management Facility  Visit type: Interventional Procedure   Primary Reason for Visit: Interventional Pain Management Treatment. CC: Back Pain  Procedure:  Anesthesia, Analgesia, Anxiolysis:  Type: Therapeutic Medial Branch Facet Block Region: Lumbar Level: L3, L4, L5,Medial Branch Level(s) Laterality: Bilateral  Type: Local Anesthesia with PO Valium 5 mg Local Anesthetic: Lidocaine 1% Route: Infiltration ([place]/IM) IV Access: Declined Sedation: Meaningful verbal contact was maintained at all times during the procedure  Indication(s): Analgesia and Anxiety   Indications: 1. Lumbar facet arthropathy   2. Lumbar spondylosis    Pain Score: Pre-procedure: 4 /10 Post-procedure: 4 /10  Pre-op Assessment:  [name] is a 77 y.o. (year old), female patient, seen today for interventional treatment. She  has a past surgical history that includes Ankle reconstruction (Left); Brachioplasty; Cataract extraction; Cholecystectomy; Soft Tissue Tumor Resection; Abdominal hysterectomy (07/12/2014); Joint replacement (Bilateral, 07/2014, 03/2015); nasal reconstrucion; Toe Surgery (Right); Shoulder open rotator cuff repair; Colonoscopy with propofol (N/A, 12/07/2016); Reduction mammaplasty (Bilateral); and Breast reduction surgery (Bilateral, 2020). [name] has a current medication list which includes the following prescription(s): acetaminophen, albuterol, amantadine, amlodipine, vitamin c, baclofen, biotin, carvedilol, vitamin d3, clonazepam, donepezil, esomeprazole, fluticasone, fluticasone furoate-vilanterol, folic acid, levothyroxine, losartan,  magnesium, melatonin, montelukast, multivitamin, nystatin, spironolactone, topiramate, torsemide, [START ON 11/14/2022] tramadol, trazodone, venlafaxine xr, and zinc gluconate. Her primarily concern today is the Back Pain  Initial Vital Signs: Blood pressure 126/75, pulse 76, temperature 98.4 F (36.9 C), resp. rate 18, height 5\' 3"  (1.6 m), weight 290 lb (131.5 kg), SpO2 96 %. BMI: Estimated body mass index is 37.12 kg/m as calculated from the following:   Height as of this encounter: 5\' 6"  (1.676 m).   Weight as of this encounter: 230 lb (104.3 kg).  Risk Assessment: Allergies: Reviewed. She is allergic to codeine, povidone iodine, hydromorphone, latex, oxycodone-acetaminophen, sulfa antibiotics, bupropion, gabapentin, hydrocodone, morphine and codeine, and procaine.  Allergy Precautions: None required Coagulopathies: Reviewed. None identified.  Blood-thinner therapy: None at this time Active Infection(s): Reviewed. None identified. [name] is afebrile  Site Confirmation: [name] was asked to confirm the procedure and laterality before marking the site Procedure checklist: Completed Consent: Before the procedure and under the influence of no sedative(s), amnesic(s), or anxiolytics, the patient was informed of the treatment options, risks and possible complications. To fulfill our ethical and legal obligations, as recommended by the American Medical Association's Code of Ethics, I have informed the patient of my clinical impression; the nature and purpose of the treatment or procedure; the risks, benefits, and possible complications of the intervention; the alternatives, including doing nothing; the risk(s) and benefit(s) of the alternative treatment(s) or procedure(s); and the risk(s) and benefit(s) of doing nothing. The patient was provided information about the general risks and possible complications associated with the procedure. These may include, but are  not limited to: failure to achieve desired goals, infection, bleeding, organ or nerve damage, allergic reactions, paralysis, and death. In addition, the patient was informed of those risks and complications associated to Spine-related procedures, such as failure to decrease pain; infection (i.e.: Meningitis, epidural or intraspinal abscess); bleeding (i.e.: epidural hematoma, subarachnoid hemorrhage, or any other type of intraspinal or peri-dural  bleeding); organ or nerve damage (i.e.: Any type of peripheral nerve, nerve root, or spinal cord injury) with subsequent damage to sensory, motor, and/or autonomic systems, resulting in permanent pain, numbness, and/or weakness of one or several areas of the body; allergic reactions; (i.e.: anaphylactic reaction); and/or death. Furthermore, the patient was informed of those risks and complications associated with the medications. These include, but are not limited to: allergic reactions (i.e.: anaphylactic or anaphylactoid reaction(s)); adrenal axis suppression; blood sugar elevation that in diabetics may result in ketoacidosis or comma; water retention that in patients with history of congestive heart failure may result in shortness of breath, pulmonary edema, and decompensation with resultant heart failure; weight gain; swelling or edema; medication-induced neural toxicity; particulate matter embolism and blood vessel occlusion with resultant organ, and/or nervous system infarction; and/or aseptic necrosis of one or more joints. Finally, the patient was informed that Medicine is not an exact science; therefore, there is also the possibility of unforeseen or unpredictable risks and/or possible complications that may result in a catastrophic outcome. The patient indicated having understood very clearly. We have given the patient no guarantees and we have made no promises. Enough time was given to the patient to ask questions, all of which were answered to the patient's  satisfaction. [name] has indicated that she wanted to continue with the procedure. Attestation: I, the ordering provider, attest that I have discussed with the patient the benefits, risks, side-effects, alternatives, likelihood of achieving goals, and potential problems during recovery for the procedure that I have provided informed consent. Date: 11/11/2022; Time: 9:08 AM  Pre-Procedure Preparation:  Monitoring: As per clinic protocol. Respiration, ETCO2, SpO2, BP, heart rate and rhythm monitor placed and checked for adequate function Safety Precautions: Patient was assessed for positional comfort and pressure points before starting the procedure. Time-out: I initiated and conducted the "Time-out" before starting the procedure, as per protocol. The patient was asked to participate by confirming the accuracy of the "Time Out" information. Verification of the correct person, site, and procedure were performed and confirmed by me, the nursing staff, and the patient. "Time-out" conducted as per Joint Commission's Universal Protocol (UP.01.01.01). "Time-out" Date & Time: 11/11/2022; 1021 hrs.  Description of Procedure Process:   Position: Prone Target Area: For Lumbar Facet blocks, the target is the groove formed by the junction of the transverse process and superior articular process. For the L5 dorsal ramus, the target is the notch between superior articular process and sacral ala.  Approach: Paramedial approach. Area Prepped: Entire Posterior Lumbosacral Region Prepping solution: ChloraPrep (2% chlorhexidine gluconate and 70% isopropyl alcohol) Safety Precautions: Aspiration looking for blood return was conducted prior to all injections. At no point did we inject any substances, as a needle was being advanced. No attempts were made at seeking any paresthesias. Safe injection practices and needle disposal techniques used. Medications properly checked for expiration dates. SDV (single  dose vial) medications used. Description of the Procedure: Protocol guidelines were followed. The patient was placed in position over the fluoroscopy table. The target area was identified and the area prepped in the usual manner. Skin desensitized using vapocoolant spray. Skin & deeper tissues infiltrated with local anesthetic. Appropriate amount of time allowed to pass for local anesthetics to take effect. The procedure needle was introduced through the skin, ipsilateral to the reported pain, and advanced to the target area. Employing the "Medial Branch Technique", the needles were advanced to the angle made by the superior and medial portion of the transverse  process, and the lateral and inferior portion of the superior articulating process of the targeted vertebral bodies. This area is known as "Burton's Eye" or the "Eye of the [place] Dog". A procedure needle was introduced through the skin, and this time advanced to the angle made by the superior and medial border of the sacral ala.  Negative aspiration confirmed. Solution injected in intermittent fashion, asking for systemic symptoms every 0.5cc of injectate. The needles were then removed and the area cleansed, making sure to leave some of the prepping solution back to take advantage of its long term bactericidal properties.   Illustration of the posterior view of the lumbar spine and the posterior neural structures. Laminae of L2 through S1 are labeled. DPRL5, dorsal primary ramus of L5; DPRS1, dorsal primary ramus of S1; DPR3, dorsal primary ramus of L3; FJ, facet (zygapophyseal) joint L3-L4; I, inferior articular process of L4; LB1, lateral branch of dorsal primary ramus of L1; IAB, inferior articular branches from L3 medial branch (supplies L4-L5 facet joint); IBP, intermediate branch plexus; MB3, medial branch of dorsal primary ramus of L3; NR3, third lumbar nerve root; S, superior articular process of L5; SAB, superior articular branches from L4  (supplies L4-5 facet joint also); TP3, transverse process of L3.  Vitals:   11/11/22 0944 11/11/22 1020 11/11/22 1025 11/11/22 1030  BP: 126/83 (!) 173/99 (!) 159/94 (!) 140/67  Pulse: 72     Resp: 20 18 16 18   Temp: (!) 97.2 F (36.2 C)     TempSrc: Temporal     SpO2: 99% 99% 95% 100%  Weight: 230 lb (104.3 kg)     Height: 5\' 6"  (1.676 m)       Start Time: 1021 hrs. End Time: 1029 hrs. Materials:  Needle(s) Type: Regular needle Gauge: 22G Length: 5-in Medication(s): We administered lidocaine, diazepam, dexamethasone, dexamethasone, ropivacaine (PF) 2 mg/mL (0.2%), and ropivacaine (PF) 2 mg/mL (0.2%). Please see chart orders for dosing details.  Imaging Guidance (Spinal):  Type of Imaging Technique: Fluoroscopy Guidance (Spinal) Indication(s): Assistance in needle guidance and placement for procedures requiring needle placement in or near specific anatomical locations not easily accessible without such assistance. Exposure Time: Please see nurses notes. Contrast: None used. Fluoroscopic Guidance: I was personally present during the use of fluoroscopy. "Tunnel Vision Technique" used to obtain the best possible view of the target area. Parallax error corrected before commencing the procedure. "Direction-depth-direction" technique used to introduce the needle under continuous pulsed fluoroscopy. Once target was reached, antero-posterior, oblique, and lateral fluoroscopic projection used confirm needle placement in all planes. Images permanently stored in EMR. Interpretation: No contrast injected. I personally interpreted the imaging intraoperatively. Adequate needle placement confirmed in multiple planes. Permanent images saved into the patient's record.  Antibiotic Prophylaxis:  Indication(s): None identified Antibiotic given: None  Post-operative Assessment:  EBL: None Complications: No immediate post-treatment complications observed by team, or reported by patient. Note: The  patient tolerated the entire procedure well. A repeat set of vitals were taken after the procedure and the patient was kept under observation following institutional policy, for this type of procedure. Post-procedural neurological assessment was performed, showing return to baseline, prior to discharge. The patient was provided with post-procedure discharge instructions, including a section on how to identify potential problems. Should any problems arise concerning this procedure, the patient was given instructions to immediately contact us, at any time, without hesitation. In any case, we plan to contact the patient by telephone for a follow-up status report regarding this interventional  procedure. Comments:  No additional relevant information. 5 out of 5 strength bilateral lower extremity: Plantar flexion, dorsiflexion, knee flexion, knee extension.  Plan of Care    Imaging Orders         DG PAIN CLINIC C-ARM 1-60 MIN NO REPORT    Procedure Orders    No procedure(s) ordered today    Medications ordered for procedure: Meds ordered this encounter  Medications   lidocaine (XYLOCAINE) 2 % (with pres) injection 400 mg   diazepam (VALIUM) tablet 5 mg    Make sure Flumazenil is available in the pyxis when using this medication. If oversedation occurs, administer 0.2 mg IV over 15 sec. If after 45 sec no response, administer 0.2 mg again over 1 min; may repeat at 1 min intervals; not to exceed 4 doses (1 mg)   dexamethasone (DECADRON) injection 10 mg   dexamethasone (DECADRON) injection 10 mg   ropivacaine (PF) 2 mg/mL (0.2%) (NAROPIN) injection 9 mL   ropivacaine (PF) 2 mg/mL (0.2%) (NAROPIN) injection 9 mL   Medications administered: We administered lidocaine, diazepam, dexamethasone, dexamethasone, ropivacaine (PF) 2 mg/mL (0.2%), and ropivacaine (PF) 2 mg/mL (0.2%).  See the medical record for exact dosing, route, and time of administration. Disposition: Discharge home  Discharge Date &  Time: 11/11/2022; 1045 hrs.   Physician-requested Follow-up: Return in about 6 weeks (around 12/23/2022) for ppe    vv. Future Appointments  Date Time Provider Department Center  12/23/2022  3:00 PM Edward Jolly, MD [facility] None    Primary Care Physician: Lauro Regulus, MD Location: [facility] Note by: Edward Jolly, MD Date: 11/11/2022; Time: 10:41 AM  Disclaimer:  Medicine is not an exact science. The only guarantee in medicine is that nothing is guaranteed. It is important to note that the decision to proceed with this intervention was based on the information collected from the patient. The Data and conclusions were drawn from the patient's questionnaire, the interview, and the physical examination. Because the information was provided in large part by the patient, it cannot be guaranteed that it has not been purposely or unconsciously manipulated. Every effort has been made to obtain as much relevant data as possible for this evaluation. It is important to note that the conclusions that lead to this procedure are derived in large part from the available data. Always take into account that the treatment will also be dependent on availability of resources and existing treatment guidelines, considered by other Pain Management Practitioners as being common knowledge and practice, at the time of the intervention. For Medico-Legal purposes, it is also important to point out that variation in procedural techniques and pharmacological choices are the acceptable norm. The indications, contraindications, technique, and results of the above procedure should only be interpreted and judged by a Board-Certified Interventional Pain Specialist with extensive familiarity and expertise in the same exact procedure and technique.

## 2022-11-11 NOTE — Patient Instructions (Signed)
____________________________________________________________________________________________ PATIENT HAS HAD 5MG  OF VALIUM- AT 0951- PRIOR TO HER PROCEDURE TODAY. Post-Procedure Discharge Instructions  Instructions: Apply ice:  Purpose: This will minimize any swelling and discomfort after procedure.  When: Day of procedure, as soon as you get home. How: Fill a plastic sandwich bag with crushed ice. Cover it with a small towel and apply to injection site. How long: (15 min on, 15 min off) Apply for 15 minutes then remove x 15 minutes.  Repeat sequence on day of procedure, until you go to bed. Apply heat:  Purpose: To treat any soreness and discomfort from the procedure. When: Starting the next day after the procedure. How: Apply heat to procedure site starting the day following the procedure. How long: May continue to repeat daily, until discomfort goes away. Food intake: Start with clear liquids (like water) and advance to regular food, as tolerated.  Physical activities: Keep activities to a minimum for the first 8 hours after the procedure. After that, then as tolerated. Driving: If you have received any sedation, be responsible and do not drive. You are not allowed to drive for 24 hours after having sedation. Blood thinner: (Applies only to those taking blood thinners) You may restart your blood thinner 6 hours after your procedure. Insulin: (Applies only to Diabetic patients taking insulin) As soon as you can eat, you may resume your normal dosing schedule. Infection prevention: Keep procedure site clean and dry. Shower daily and clean area with soap and water. Post-procedure Pain Diary: Extremely important that this be done correctly and accurately. Recorded information will be used to determine the next step in treatment. For the purpose of accuracy, follow these rules: Evaluate only the area treated. Do not report or include pain from an untreated area. For the purpose of this  evaluation, ignore all other areas of pain, except for the treated area. After your procedure, avoid taking a long nap and attempting to complete the pain diary after you wake up. Instead, set your alarm clock to go off every hour, on the hour, for the initial 8 hours after the procedure. Document the duration of the numbing medicine, and the relief you are getting from it. Do not go to sleep and attempt to complete it later. It will not be accurate. If you received sedation, it is likely that you were given a medication that may cause amnesia. Because of this, completing the diary at a later time may cause the information to be inaccurate. This information is needed to plan your care. Follow-up appointment: Keep your post-procedure follow-up evaluation appointment after the procedure (usually 2 weeks for most procedures, 6 weeks for radiofrequencies). DO NOT FORGET to bring you pain diary with you.   Expect: (What should I expect to see with my procedure?) From numbing medicine (AKA: Local Anesthetics): Numbness or decrease in pain. You may also experience some weakness, which if present, could last for the duration of the local anesthetic. Onset: Full effect within 15 minutes of injected. Duration: It will depend on the type of local anesthetic used. On the average, 1 to 8 hours.  From steroids (Applies only if steroids were used): Decrease in swelling or inflammation. Once inflammation is improved, relief of the pain will follow. Onset of benefits: Depends on the amount of swelling present. The more swelling, the longer it will take for the benefits to be seen. In some cases, up to 10 days. Duration: Steroids will stay in the system x 2 weeks. Duration of benefits will  depend on multiple posibilities including persistent irritating factors. Side-effects: If present, they may typically last 2 weeks (the duration of the steroids). Frequent: Cramps (if they occur, drink Gatorade and take  over-the-counter Magnesium 450-500 mg once to twice a day); water retention with temporary weight gain; increases in blood sugar; decreased immune system response; increased appetite. Occasional: Facial flushing (red, warm cheeks); mood swings; menstrual changes. Uncommon: Long-term decrease or suppression of natural hormones; bone thinning. (These are more common with higher doses or more frequent use. This is why we prefer that our patients avoid having any injection therapies in other practices.)  Very Rare: Severe mood changes; psychosis; aseptic necrosis. From procedure: Some discomfort is to be expected once the numbing medicine wears off. This should be minimal if ice and heat are applied as instructed.  Call if: (When should I call?) You experience numbness and weakness that gets worse with time, as opposed to wearing off. New onset bowel or bladder incontinence. (Applies only to procedures done in the spine)  Emergency Numbers: Durning business hours (Monday - Thursday, 8:00 AM - 4:00 PM) (Friday, 9:00 AM - 12:00 Noon): [phone] After hours: [phone] NOTE: If you are having a problem and are unable connect with, or to talk to a provider, then go to your nearest urgent care or emergency department. If the problem is serious and urgent, please call 911. ____________________________________________________________________________________________   Facet Blocks Patient Information  Description: The facets are joints in the spine between the vertebrae.  Like any joints in the body, facets can become irritated and painful.  Arthritis can also effect the facets.  By injecting steroids and local anesthetic in and around these joints, we can temporarily block the nerve supply to them.  Steroids act directly on irritated nerves and tissues to reduce selling and inflammation which often leads to decreased pain.  Facet blocks may be done anywhere along the spine from the neck to the low  back depending upon the location of your pain.   After numbing the skin with local anesthetic (like Novocaine), a small needle is passed onto the facet joints under x-ray guidance.  You may experience a sensation of pressure while this is being done.  The entire block usually lasts about 15-25 minutes.   Conditions which may be treated by facet blocks:  Low back/buttock pain Neck/shoulder pain Certain types of headaches  Preparation for the injection:  Do not eat any solid food or dairy products within 8 hours of your appointment. You may drink clear liquid up to 3 hours before appointment.  Clear liquids include water, black coffee, juice or soda.  No milk or cream please. You may take your regular medication, including pain medications, with a sip of water before your appointment.  Diabetics should hold regular insulin (if taken separately) and take 1/2 normal NPH dose the morning of the procedure.  Carry some sugar containing items with you to your appointment. A driver must accompany you and be prepared to drive you home after your procedure. Bring all your current medications with you. An IV may be inserted and sedation may be given at the discretion of the physician. A blood pressure cuff, EKG and other monitors will often be applied during the procedure.  Some patients may need to have extra oxygen administered for a short period. You will be asked to provide medical information, including your allergies and medications, prior to the procedure.  We must know immediately if you are taking blood thinners (  like Coumadin/Warfarin) or if you are allergic to IV iodine contrast (dye).  We must know if you could possible be pregnant.  Possible side-effects:  Bleeding from needle site Infection (rare, may require surgery) Nerve injury (rare) Numbness & tingling (temporary) Difficulty urinating (rare, temporary) Spinal headache (a headache worse with upright posture) Light-headedness  (temporary) Pain at injection site (serveral days) Decreased blood pressure (rare, temporary) Weakness in arm/leg (temporary) Pressure sensation in back/neck (temporary)   Call if you experience:  Fever/chills associated with headache or increased back/neck pain Headache worsened by an upright position New onset, weakness or numbness of an extremity below the injection site Hives or difficulty breathing (go to the emergency room) Inflammation or drainage at the injection site(s) Severe back/neck pain greater than usual New symptoms which are concerning to you  Please note:  Although the local anesthetic injected can often make your back or neck feel good for several hours after the injection, the pain will likely return. It takes 3-7 days for steroids to work.  You may not notice any pain relief for at least one week.  If effective, we will often do a series of 2-3 injections spaced 3-6 weeks apart to maximally decrease your pain.  After the initial series, you may be a candidate for a more permanent nerve block of the facets.  If you have any questions, please call [phone] [facility]

## 2022-11-11 NOTE — Progress Notes (Signed)
Safety precautions to be maintained throughout the outpatient stay will include: orient to surroundings, keep bed in low position, maintain call bell within reach at all times, provide assistance with transfer out of bed and ambulation.  

## 2022-11-12 ENCOUNTER — Telehealth: Payer: Self-pay

## 2022-11-12 NOTE — Telephone Encounter (Signed)
Called PP. No answer, [employer] full unable to leave message.

## 2022-12-23 ENCOUNTER — Ambulatory Visit
Payer: Medicare Other | Attending: Student in an Organized Health Care Education/Training Program | Admitting: Student in an Organized Health Care Education/Training Program

## 2022-12-23 DIAGNOSIS — M47816 Spondylosis without myelopathy or radiculopathy, lumbar region: Secondary | ICD-10-CM

## 2022-12-23 NOTE — Progress Notes (Signed)
Patient: [name]  Service Category: E/M  Provider: Edward Jolly, MD  DOB: Dec 08, 1945  DOS: 12/23/2022  Location: Office  MRN: [id number]  Setting: Ambulatory outpatient  Referring Provider: Lauro Regulus, MD  Type: Established Patient  Specialty: Interventional Pain Management  PCP: Lauro Regulus, MD  Location: Remote location  Delivery: TeleHealth     Virtual Encounter - Pain Management PROVIDER NOTE: Information contained herein reflects review and annotations entered in association with encounter. Interpretation of such information and data should be left to medically-trained personnel. Information provided to patient can be located elsewhere in the medical record under "Patient Instructions". Document created using STT-dictation technology, any transcriptional errors that may result from process are unintentional.    Contact & Pharmacy Preferred: [phone] Home: [phone] (home) Mobile: [phone] (mobile) E-mail: [facility] [phone] [place], [place] - [address] AT [facility] [address] [place] [address] Phone: [phone] Fax: [phone]  [id number] - [place], [place] - [address] AT [facility] OF [address] [address] [place] [address] Phone: [phone] Fax: [phone]   Pre-screening  [name] offered "in-person" vs "virtual" encounter. She indicated preferring virtual for this encounter.   Reason COVID-19*  Social distancing based on CDC and AMA recommendations.   I contacted [name] on 12/23/2022 via telephone.      I clearly identified myself as Edward Jolly, MD. I verified that I was speaking with the correct person using two identifiers (Name: [name], and date of birth: 05/31/1946).  Consent I sought verbal advanced consent from Robin Mose  [name] for virtual visit interactions. I informed [name] of possible security and privacy concerns, risks, and limitations associated with providing "not-in-person" medical evaluation and management services. I also informed [name] of the availability of "in-person" appointments. Finally, I informed her that there would be a charge for the virtual visit and that she could be  personally, fully or partially, financially responsible for it. [name] expressed understanding and agreed to proceed.   Historic Elements   [name] is a 77 y.o. year old, female patient evaluated today after our last contact on 11/11/2022. [name]  has a past medical history of Benign positional vertigo (03/04/2016), Brain injury (HCC), Climacteric, Depression, DJD (degenerative joint disease), Dyslipidemia, GERD (gastroesophageal reflux disease), Hypertension, Hypokalemia, Hypothyroidism, IBS (irritable bowel syndrome), OAB (overactive bladder), Ovarian cyst (03/25/2015), Rhinitis, Sleep apnea, and Spasm of thoracolumbar muscle. She also  has a past surgical history that includes Ankle reconstruction (Left); Brachioplasty; Cataract extraction; Cholecystectomy; Soft Tissue Tumor Resection; Abdominal hysterectomy (07/12/2014); Joint replacement (Bilateral, 07/2014, 03/2015); nasal reconstrucion; Toe Surgery (Right); Shoulder open rotator cuff repair; Colonoscopy with propofol (N/A, 12/07/2016); Reduction mammaplasty (Bilateral); and Breast reduction surgery (Bilateral, 2020). [name] has a current medication list which includes the following prescription(s): acetaminophen, albuterol, amantadine, amlodipine, vitamin c, baclofen, biotin, carvedilol, vitamin d3, clonazepam, donepezil, esomeprazole, fluticasone, fluticasone furoate-vilanterol, folic acid, levothyroxine, losartan, magnesium, melatonin, montelukast, multivitamin,  nystatin, spironolactone, topiramate, torsemide, tramadol, trazodone, venlafaxine xr, and zinc gluconate. She  reports that she has never smoked. She has never used smokeless tobacco. She reports current alcohol use of about 2.0 standard drinks of alcohol per week. She reports that she does not use drugs. [name] is allergic to codeine, povidone iodine, hydromorphone, latex, oxycodone-acetaminophen,  sulfa antibiotics, bupropion, gabapentin, hydrocodone, morphine and codeine, and procaine.  BMI: Estimated body mass index is 37.12 kg/m as calculated from the following:   Height as of 11/11/22: 5\' 6"  (1.676 m).   Weight as of 11/11/22: 230 lb (104.3 kg). Last encounter: 10/01/2022. Last procedure: 11/11/2022.  HPI  Today, she is being contacted for a post-procedure assessment.   Post-procedure evaluation   Type: Therapeutic Medial Branch Facet Block Region: Lumbar Level: L3, L4, L5,Medial Branch Level(s) Laterality: Bilateral    Effectiveness:  Initial hour after procedure: 100 %  Subsequent 4-6 hours post-procedure: 100 %  Analgesia past initial 6 hours: 90 % (ongoing)  Ongoing improvement:  Analgesic:  90% Function: Somewhat improved ROM: Somewhat improved     Laboratory Chemistry Profile   Renal Lab Results  Component Value Date   BUN 30 (H) 11/02/2022   CREATININE 1.28 (H) 11/02/2022   LABCREA 44 10/28/2022   GFRNONAA 43 (L) 11/02/2022    Hepatic Lab Results  Component Value Date   AST 14 (L) 10/28/2022   ALT 13 10/28/2022   ALBUMIN 3.6 10/28/2022   ALKPHOS 69 10/28/2022    Electrolytes Lab Results  Component Value Date   NA 136 11/02/2022   K 3.6 11/02/2022   CL 108 11/02/2022   CALCIUM 10.2 11/02/2022   MG 2.4 10/29/2022   PHOS 3.0 07/06/2020    Bone No results found for: "VD25OH", "VD125OH2TOT", "MV7846NG2", "XB2841LK4", "25OHVITD1", "25OHVITD2", "25OHVITD3", "TESTOFREE", "TESTOSTERONE"  Inflammation (CRP: Acute Phase) (ESR: Chronic  Phase) Lab Results  Component Value Date   LATICACIDVEN 1.1 07/02/2020         Note: Above Lab results reviewed.  Imaging  DG PAIN CLINIC C-ARM 1-60 MIN NO REPORT Fluoro was used, but no Radiologist interpretation will be provided.  Please refer to "NOTES" tab for provider progress note.  Assessment  The primary encounter diagnosis was Lumbar facet arthropathy. A diagnosis of Lumbar spondylosis was also pertinent to this visit.  Plan of Care  Trenia is doing very well after her bilateral lumbar facet medial branch nerve blocks.  She endorses a reduction in pain and improvement in range of motion.  Follow-up as needed.  Follow-up plan:   Return if symptoms worsen or fail to improve.      Status post left L3, L4, L5 RFA 12/01/2017, 12/14/2018, 06/21/2019.  Status post right L3, L4, L5 RFA on 12/22/2017, 12/28/2018, 06/21/2019, 07/10/2019 helps decrease her pain and improve her functional status for axial low back for approximately 6 to 8 months post RFA.  Can repeat every 6 to 12 months.  Left L4 Sprint peripheral nerve stimulation 03/13/2020, lead displacement/migration, lead removed and left L4 medial branch nerve PNS replacement on 05/15/2020, became dislodged again early February.   Right L3, L4, L5 RFA 09/30/2020, left L3, L4, L5 RFA 08/14/2020. Left L3, L4, L5 RFA 02/23/2022, right L3, L4, L5 RFA 01/28/2022       Recent Visits Date Type Provider Dept  11/11/22 Procedure visit Edward Jolly, MD [facility]  10/01/22 Office Visit Edward Jolly, MD [facility]  Showing recent visits within past 90 days and meeting all other requirements Today's Visits Date Type Provider Dept  12/23/22 Office Visit Edward Jolly, MD [facility]  Showing today's visits and meeting all other requirements Future Appointments No visits were found meeting these conditions. Showing future appointments within next 90 days and meeting all other requirements  I discussed the assessment and  treatment plan with the patient. The  patient was provided an opportunity to ask questions and all were answered. The patient agreed with the plan and demonstrated an understanding of the instructions.  Patient advised to call back or seek an in-person evaluation if the symptoms or condition worsens.  Duration of encounter: .  Note by: Edward Jolly, MD Date: 12/23/2022; Time: 10:35 AM

## 2023-02-18 ENCOUNTER — Emergency Department
Admission: EM | Admit: 2023-02-18 | Discharge: 2023-02-19 | Disposition: A | Discharge status: Home / Self Care | Payer: Medicare Other | Attending: Emergency Medicine | Admitting: Emergency Medicine

## 2023-02-18 ENCOUNTER — Other Ambulatory Visit: Payer: Self-pay

## 2023-02-18 ENCOUNTER — Emergency Department: Payer: Medicare Other

## 2023-02-18 ENCOUNTER — Ambulatory Visit: Payer: Medicare Other | Admitting: Student in an Organized Health Care Education/Training Program

## 2023-02-18 DIAGNOSIS — M25512 Pain in left shoulder: Secondary | ICD-10-CM | POA: Diagnosis present

## 2023-02-18 LAB — CBC
HCT: 34 % — ABNORMAL LOW (ref 36.0–46.0)
Hemoglobin: 11.4 g/dL — ABNORMAL LOW (ref 12.0–15.0)
MCH: 32.7 pg (ref 26.0–34.0)
MCHC: 33.5 g/dL (ref 30.0–36.0)
MCV: 97.4 fL (ref 80.0–100.0)
Platelets: 329 10*3/uL (ref 150–400)
RBC: 3.49 MIL/uL — ABNORMAL LOW (ref 3.87–5.11)
RDW: 12.9 % (ref 11.5–15.5)
WBC: 9.7 10*3/uL (ref 4.0–10.5)
nRBC: 0 % (ref 0.0–0.2)

## 2023-02-18 LAB — BASIC METABOLIC PANEL
Anion gap: 12 (ref 5–15)
BUN: 54 mg/dL — ABNORMAL HIGH (ref 8–23)
CO2: 21 mmol/L — ABNORMAL LOW (ref 22–32)
Calcium: 10.2 mg/dL (ref 8.9–10.3)
Chloride: 101 mmol/L (ref 98–111)
Creatinine, Ser: 1.78 mg/dL — ABNORMAL HIGH (ref 0.44–1.00)
GFR, Estimated: 29 mL/min — ABNORMAL LOW (ref >60)
Glucose, Bld: 86 mg/dL (ref 70–99)
Potassium: 4.5 mmol/L (ref 3.5–5.1)
Sodium: 134 mmol/L — ABNORMAL LOW (ref 135–145)

## 2023-02-18 MED ORDER — PREDNISONE 10 MG (21) PO TBPK: ORAL_TABLET | ORAL | 0 refills | Status: DC

## 2023-02-18 MED ORDER — PREDNISONE 20 MG PO TABS
60.0000 mg | ORAL_TABLET | Freq: Once | ORAL | Status: AC
  Administered 2023-02-18: 60 mg via ORAL
  Filled 2023-02-18: qty 3

## 2023-02-18 NOTE — ED Notes (Signed)
RN called [facility] and spoke with Dispatcher [phone] about needing to file a APS report and speak with a APS worker.

## 2023-02-18 NOTE — ED Notes (Signed)
Kylie Marrow with DSS contact info. [phone]

## 2023-02-18 NOTE — ED Notes (Addendum)
RN assisted pt to the bedside cammode. RN has to pull pt up out of bed in order for pt to stand. Pt was very weak and shaky upon getting out of the bed. Pt sts that there is no one to take care of herself and her husband while they are at home.

## 2023-02-18 NOTE — ED Provider Notes (Signed)
[facility] Provider Note    Event Date/Time   First MD Initiated Contact with Patient 02/18/23 1858     (approximate)   History   Shoulder Pain   HPI  Kaileah Sinkler Hollifield [name] is a 77 y.o. female who presents to the emergency department today because of concerns for left shoulder pain.  The patient says that it started hurting worse a couple days ago.  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  She thinks that her left shoulder might of frozen up on her.  She is scheduled to get injections in her shoulder but not for a couple of weeks.  She says that she did have a fall where she slid out of her bed although denies hitting her shoulder prior to the pain being worse.     Physical Exam   Triage Vital Signs: ED Triage Vitals  Encounter Vitals Group     BP 02/18/23 1857 (!) 103/47     Systolic BP Percentile --      Diastolic BP Percentile --      Pulse Rate 02/18/23 1854 79     Resp 02/18/23 1854 (!) 25     Temp 02/18/23 1854 98.7 F (37.1 C)     Temp Source 02/18/23 1854 Oral     SpO2 02/18/23 1854 98 %     Weight 02/18/23 1852 200 lb (90.7 kg)     Height --      Head Circumference --      Peak Flow --      Pain Score 02/18/23 1851 9     Pain Loc --      Pain Education --      Exclude from Growth Chart --     Most recent vital signs: Vitals:   02/18/23 1854 02/18/23 1857  BP:  (!) 103/47  Pulse: 79   Resp: (!) 25   Temp: 98.7 F (37.1 C)   SpO2: 98%    General: Awake, alert, oriented. CV:  Good peripheral perfusion.  Resp:  Normal effort.  Abd:  No distention.  Other:  Left shoulder without deformity. Some tenderness to palpation and manipulation. Radial pulse 2+.   ED Results / Procedures / Treatments   Labs (all labs ordered are listed, but only abnormal results are displayed) Labs Reviewed  CBC - Abnormal; Notable for the following components:      Result Value   RBC 3.49 (*)    Hemoglobin 11.4 (*)    HCT  34.0 (*)    All other components within normal limits  BASIC METABOLIC PANEL - Abnormal; Notable for the following components:   Sodium 134 (*)    CO2 21 (*)    BUN 54 (*)    Creatinine, Ser 1.78 (*)    GFR, Estimated 29 (*)    All other components within normal limits     RADIOLOGY I independently interpreted and visualized the left shoulder. My interpretation: no fracture/dislocation Radiology interpretation: IMPRESSION:  1. No evidence of acute fracture or dislocation.  2. Severe degenerative changes of the left glenohumeral joint.    PROCEDURES:  Critical Care performed: No  MEDICATIONS ORDERED IN ED: Medications  predniSONE (DELTASONE) tablet 60 mg (has no administration in time range)     IMPRESSION / MDM / ASSESSMENT AND PLAN / ED COURSE  I reviewed the triage vital signs and the nursing notes.  Differential diagnosis includes, but is not limited to, arthritis, fracture, dislocation  Patient's presentation is most consistent with acute presentation with potential threat to life or bodily function.   Patient presented to the emergency department today because of concerns for left shoulder pain.  On exam she does have tenderness to palpation and manipulation of the left shoulder joint.  Neurovascularly intact distally.  X-ray did not show any fracture or dislocation.  Did discuss with patient importance of follow-up with orthopedics.  Plan was to discharge patient on course of steroids.  However prior to patient being discharged concerns were raised over current living condition.  Family was spoken to and apparently patient lives in a hoarder type situation.  Additionally she is the primary caregiver for her husband who suffered a TBI.  Awaiting family to arrive to the emergency department however do have concerns that discharge home might not be safe discharge for patient.      FINAL CLINICAL IMPRESSION(S) / ED DIAGNOSES   Final  diagnoses:  Left shoulder pain, unspecified chronicity    Note:  This document was prepared using Dragon voice recognition software and may include unintentional dictation errors.    Phineas Semen, MD 02/18/23 2056

## 2023-02-18 NOTE — ED Notes (Addendum)
[name] with APS returned RN phone call. RN advised Robin of findings of [name] well being as well as [name] not being able to take care of her husband who has mental disabilities and is not able to make his own medical decisions. Robin sts that [name] and her husband were at [facility] and left the facility on 02/09/23 according to the [name]. [name] sts that she wants to go home. [name] was not able to devise a plan for her self nor her husband that is safe for the both of them. [name] was in agreement with Robin and RN that in order for her to stay at the ED with her husband the [name] would need to be checked in as a [name] so the ER staff was able to attend to his needs as she does not want the [name] daughter to take her husband home. Daughter, ashlynne mesker that she is not able to take care of them both safely.  [name] verbalized agreement to plan. [name] is very tearful at this time due to not wanting to get split up from her husband. RN assured [name] that this RN would attempt to not let that happen while in the ED. Per [name], she will be working on this case in the AM and will be attempting to get a custody order over [name] husband due to lack of care from the [name] as well as unable to provide a safe environment for him.

## 2023-02-18 NOTE — Discharge Instructions (Signed)
Please seek medical attention for any high fevers, chest pain, shortness of breath, change in behavior, persistent vomiting, bloody stool or any other new or concerning symptoms.  

## 2023-02-18 NOTE — ED Notes (Addendum)
RN spoke with pt daughter in law Shanya, per [place], the pt and husband were both in a facility and the pt AMA'd both of them due to not being able to be in the same room and left on 02/11/2023. Toyia continues to sts that the pt and her father live in a horded house with limited food and the ability to not walk without falling in the house or the ability to walk at all as well as not having the financial mean to take care of themselves. [place] sts that the pt has a POA and so does her father however, those ppl are not interested in taking care of them nor helping them out with their health and refuse to answer the phone. RN notified provider and the ED charge of situation.

## 2023-02-18 NOTE — ED Triage Notes (Addendum)
Pt arrived via EMS. Per EMS pt fell two days ago and felt pain in her left shoulder. Per EMS there is no deformity or signs of a dislocation. PMS noted. Pt noted to be very anxious at this time.

## 2023-02-19 NOTE — ED Provider Notes (Signed)
Patient received in signout from Dr. Derrill Kay.  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  Husband has medical necessity to be admitted upstairs and patient does not.  Originally planned to discharge patient with course of steroids for her acute on chronic shoulder pain from arthritis, but as thoroughly documented separately this would not be feasible as this would involve patient unable to care for her husband.  As we have a safe disposition for the husband separately, we will discharge the patient per original plan of care.    Delton [place], MD 02/19/23 [phone]

## 2023-02-22 NOTE — Group Note (Deleted)

## 2023-03-09 ENCOUNTER — Ambulatory Visit
Payer: Medicare Other | Attending: Student in an Organized Health Care Education/Training Program | Admitting: Student in an Organized Health Care Education/Training Program

## 2023-03-09 ENCOUNTER — Encounter: Payer: Self-pay | Admitting: Student in an Organized Health Care Education/Training Program

## 2023-03-09 VITALS — BP 105/68 | HR 100 | Temp 97.5°F | Ht 66.0 in | Wt 200.0 lb

## 2023-03-09 DIAGNOSIS — G894 Chronic pain syndrome: Secondary | ICD-10-CM | POA: Insufficient documentation

## 2023-03-09 DIAGNOSIS — M47816 Spondylosis without myelopathy or radiculopathy, lumbar region: Secondary | ICD-10-CM | POA: Diagnosis present

## 2023-03-09 DIAGNOSIS — M5136 Other intervertebral disc degeneration, lumbar region: Secondary | ICD-10-CM | POA: Insufficient documentation

## 2023-03-09 NOTE — Progress Notes (Signed)
Safety precautions to be maintained throughout the outpatient stay will include: orient to surroundings, keep bed in low position, maintain call bell within reach at all times, provide assistance with transfer out of bed and ambulation.  

## 2023-03-09 NOTE — Progress Notes (Signed)
PROVIDER NOTE: Information contained herein reflects review and annotations entered in association with encounter. Interpretation of such information and data should be left to medically-trained personnel. Information provided to patient can be located elsewhere in the medical record under "Patient Instructions". Document created using STT-dictation technology, any transcriptional errors that may result from process are unintentional.    Patient: [name]  Service Category: E/M  Provider: Edward Jolly, MD  DOB: 01-Mar-1946  DOS: 03/09/2023  Referring Provider: Lauro Regulus, MD  MRN: [id number]  Specialty: Interventional Pain Management  PCP: Lauro Regulus, MD  Type: Established Patient  Setting: Ambulatory outpatient    Location: Office  Delivery: Face-to-face     HPI  [name], a 77 y.o. year old female, is here today because of her Lumbar facet arthropathy [M47.816]. [name]'s primary complain today is Back Pain (lower)  Pertinent problems: [name] has Lumbar spondylosis; Lumbar facet arthropathy; Lumbar degenerative disc disease; Spasm of lumbar paraspinous muscle; and Chronic pain syndrome on their pertinent problem list. Pain Assessment: Severity of Chronic pain is reported as a 5 /10. Location: Back  /Denies. Onset: More than a month ago. Quality: Aching. Timing: Constant. Modifying factor(s): laying and sitting down. Vitals:  height is 5\' 6"  (1.676 m) and weight is 200 lb (90.7 kg). Her temperature is 97.5 F (36.4 C) (abnormal). Her blood pressure is 105/68 and her pulse is 100.  BMI: Estimated body mass index is 32.28 kg/m as calculated from the following:   Height as of this encounter: 5\' 6"  (1.676 m).   Weight as of this encounter: 200 lb (90.7 kg). Last encounter: 12/23/2022. Last procedure: 11/11/2022.  Reason for encounter: Patient presents today with increased lower lumbar back pain,  bilateral, secondary to lumbar facet arthropathy and lumbar spondylosis.  She is status post right L3, L4, L5 RFA 01/28/2022 and status post left L3, L4, L5 RFA on 02/23/2022.  This provided the patient with 75% pain relief for approximately 8 months and now she is experiencing return of her low back pain.  We discussed repeating lumbar RFA.  Risks and benefits reviewed and patient would like to proceed.  She is losing weight.  She tries to do home stretching exercises.  She has done physical therapy in the past.  She utilizes acetaminophen as well.  ROS  Constitutional: Denies any fever or chills Gastrointestinal: No reported hemesis, hematochezia, vomiting, or acute GI distress Musculoskeletal:  Low back pain Neurological: No reported episodes of acute onset apraxia, aphasia, dysarthria, agnosia, amnesia, paralysis, loss of coordination, or loss of consciousness  Medication Review  Biotin, Vitamin D3, acetaminophen, amLODipine, amantadine, baclofen, carvedilol, donepezil, esomeprazole, fluticasone, fluticasone furoate-vilanterol, folic acid, levothyroxine, losartan, magnesium, melatonin, montelukast, multivitamin, nystatin, predniSONE, spironolactone, topiramate, torsemide, traZODone, venlafaxine XR, vitamin C, and zinc gluconate  History Review  Allergy: [name] is allergic to codeine, povidone iodine, hydromorphone, latex, oxycodone-acetaminophen, sulfa antibiotics, bupropion, gabapentin, hydrocodone, morphine and codeine, and procaine. Drug: [name]  reports no history of drug use. Alcohol:  reports current alcohol use of about 2.0 standard drinks of alcohol per week. Tobacco:  reports that she has never smoked. She has never used smokeless tobacco. Social: [name]  reports that she has never smoked. She has never used smokeless tobacco. She reports current alcohol use of about 2.0 standard drinks of alcohol per week. She reports that she  does not use drugs. Medical:  has a past medical history of  Benign positional vertigo (03/04/2016), Brain injury (HCC), Climacteric, Depression, DJD (degenerative joint disease), Dyslipidemia, GERD (gastroesophageal reflux disease), Hypertension, Hypokalemia, Hypothyroidism, IBS (irritable bowel syndrome), OAB (overactive bladder), Ovarian cyst (03/25/2015), Rhinitis, Sleep apnea, and Spasm of thoracolumbar muscle. Surgical: [name]  has a past surgical history that includes Ankle reconstruction (Left); Brachioplasty; Cataract extraction; Cholecystectomy; Soft Tissue Tumor Resection; Abdominal hysterectomy (07/12/2014); Joint replacement (Bilateral, 07/2014, 03/2015); nasal reconstrucion; Toe Surgery (Right); Shoulder open rotator cuff repair; Colonoscopy with propofol (N/A, 12/07/2016); Reduction mammaplasty (Bilateral); and Breast reduction surgery (Bilateral, 2020). Family: family history includes Breast cancer in her paternal aunt; Lung cancer in her father and mother.  Laboratory Chemistry Profile   Renal Lab Results  Component Value Date   BUN 54 (H) 02/18/2023   CREATININE 1.78 (H) 02/18/2023   LABCREA 44 10/28/2022   GFRNONAA 29 (L) 02/18/2023    Hepatic Lab Results  Component Value Date   AST 14 (L) 10/28/2022   ALT 13 10/28/2022   ALBUMIN 3.6 10/28/2022   ALKPHOS 69 10/28/2022    Electrolytes Lab Results  Component Value Date   NA 134 (L) 02/18/2023   K 4.5 02/18/2023   CL 101 02/18/2023   CALCIUM 10.2 02/18/2023   MG 2.4 10/29/2022   PHOS 3.0 07/06/2020    Bone No results found for: "VD25OH", "VD125OH2TOT", "VH8469GE9", "BM8413KG4", "25OHVITD1", "25OHVITD2", "25OHVITD3", "TESTOFREE", "TESTOSTERONE"  Inflammation (CRP: Acute Phase) (ESR: Chronic Phase) Lab Results  Component Value Date   LATICACIDVEN 1.1 07/02/2020         Note: Above Lab results reviewed.  Recent Imaging Review  DG Shoulder Left CLINICAL DATA:  Left shoulder pain, fall 2 days  ago  EXAM: LEFT SHOULDER - 3 VIEW  COMPARISON:  None Available.  FINDINGS: No evidence of fracture or dislocation. Severe degenerative changes of the left glenohumeral joint. Soft tissues are unremarkable.  IMPRESSION: 1. No evidence of acute fracture or dislocation. 2. Severe degenerative changes of the left glenohumeral joint.  Electronically Signed   By: Allegra Lai M.D.   On: 02/18/2023 20:34 Note: Reviewed        Physical Exam  General appearance: Well nourished, well developed, and well hydrated. In no apparent acute distress Mental status: Alert, oriented x 3 (person, place, & time)       Respiratory: No evidence of acute respiratory distress Eyes: PERLA Vitals: BP 105/68   Pulse 100   Temp (!) 97.5 F (36.4 C)   Ht 5\' 6"  (1.676 m)   Wt 200 lb (90.7 kg)   BMI 32.28 kg/m  BMI: Estimated body mass index is 32.28 kg/m as calculated from the following:   Height as of this encounter: 5\' 6"  (1.676 m).   Weight as of this encounter: 200 lb (90.7 kg). Ideal: Ideal body weight: 59.3 kg (130 lb 11.7 oz) Adjusted ideal body weight: 71.9 kg (158 lb 7 oz)  Lumbar Spine Area Exam  Skin & Axial Inspection: No masses, redness, or swelling Alignment: Symmetrical Functional ROM: Pain restricted ROM affecting both sides Stability: No instability detected Muscle Tone/Strength: Functionally intact. No obvious neuro-muscular anomalies detected. Sensory (Neurological): Musculoskeletal pain pattern, facet mediated Palpation: Complains of area being tender to palpation Bilateral Fist Percussion Test Provocative Tests: Hyperextension/rotation test: (+) bilaterally for facet joint pain. Lumbar quadrant test (Kemp's test): (+) bilaterally for facet joint pain.  Gait & Posture Assessment  Ambulation: Patient came in today in a wheel chair Gait: Antalgic gait (limping) Posture: Difficulty standing up straight, due to pain  Lower Extremity Exam    Side: Right lower extremity   Side: Left lower extremity  Stability: No instability observed          Stability: No instability observed          Skin & Extremity Inspection: Skin color, temperature, and hair growth are WNL. No peripheral edema or cyanosis. No masses, redness, swelling, asymmetry, or associated skin lesions. No contractures.  Skin & Extremity Inspection: Skin color, temperature, and hair growth are WNL. No peripheral edema or cyanosis. No masses, redness, swelling, asymmetry, or associated skin lesions. No contractures.  Functional ROM: Unrestricted ROM                  Functional ROM: Unrestricted ROM                  Muscle Tone/Strength: Functionally intact. No obvious neuro-muscular anomalies detected.  Muscle Tone/Strength: Functionally intact. No obvious neuro-muscular anomalies detected.  Sensory (Neurological): Unimpaired        Sensory (Neurological): Unimpaired        DTR: Patellar: deferred today Achilles: deferred today Plantar: deferred today  DTR: Patellar: deferred today Achilles: deferred today Plantar: deferred today  Palpation: No palpable anomalies  Palpation: No palpable anomalies    Assessment   Diagnosis Status  1. Lumbar facet arthropathy   2. Lumbar spondylosis   3. Lumbar degenerative disc disease   4. Chronic pain syndrome    Having a Flare-up Having a Flare-up Controlled   Updated Problems: No problems updated.  Plan of Care  Given such great analgesic and functional response to previous RFA and with her last RFA being over a year ago, recommend repeating bilateral L3, L4, L5 RFA for the management of her chronic low back pain related to lumbar facet arthropathy and spondylosis.   Orders:  Orders Placed This Encounter  Procedures   Radiofrequency,Lumbar    Standing Status:   Future    Standing Expiration Date:   06/08/2023    Scheduling Instructions:     Side(s): Bilateral     Level(s):  L3, L4, L5, Medial Branch Nerve(s)     Sedation: With Sedation      Scheduling Timeframe: As soon as pre-approved    Order Specific Question:   Where will this procedure be performed?    Answer:   [facility]   Follow-up plan:   Return in about 20 days (around 03/29/2023) for B/L L3, 4, 5 RFA, in clinic IV Versed.      Status post left L3, L4, L5 RFA 12/01/2017, 12/14/2018, 06/21/2019.  Status post right L3, L4, L5 RFA on 12/22/2017, 12/28/2018, 06/21/2019, 07/10/2019 helps decrease her pain and improve her functional status for axial low back for approximately 6 to 8 months post RFA.  Can repeat every 6 to 12 months.  Left L4 Sprint peripheral nerve stimulation 03/13/2020, lead displacement/migration, lead removed and left L4 medial branch nerve PNS replacement on 05/15/2020, became dislodged again early February.   Right L3, L4, L5 RFA 09/30/2020, left L3, L4, L5 RFA 08/14/2020. Left L3, L4, L5 RFA 02/23/2022, right L3, L4, L5 RFA 01/28/2022        Recent Visits Date Type Provider Dept  12/23/22 Office Visit Edward Jolly, MD [facility]  Showing recent visits within past 90 days and meeting all other requirements Today's Visits Date Type Provider Dept  03/09/23 Office Visit Edward Jolly, MD [facility]  Showing today's visits and meeting all other requirements Future Appointments  No visits were found meeting these conditions. Showing future appointments within next 90 days and meeting all other requirements  I discussed the assessment and treatment plan with the patient. The patient was provided an opportunity to ask questions and all were answered. The patient agreed with the plan and demonstrated an understanding of the instructions.  Patient advised to call back or seek an in-person evaluation if the symptoms or condition worsens.  Duration of encounter: 20 minutes.  Total time on encounter, as per AMA guidelines included both the face-to-face and non-face-to-face time personally spent by the physician and/or other qualified health  care professional(s) on the day of the encounter (includes time in activities that require the physician or other qualified health care professional and does not include time in activities normally performed by clinical staff). Physician's time may include the following activities when performed: Preparing to see the patient (e.g., pre-charting review of records, searching for previously ordered imaging, lab work, and nerve conduction tests) Review of prior analgesic pharmacotherapies. Reviewing PMP Interpreting ordered tests (e.g., lab work, imaging, nerve conduction tests) Performing post-procedure evaluations, including interpretation of diagnostic procedures Obtaining and/or reviewing separately obtained history Performing a medically appropriate examination and/or evaluation Counseling and educating the patient/family/caregiver Ordering medications, tests, or procedures Referring and communicating with other health care professionals (when not separately reported) Documenting clinical information in the electronic or other health record Independently interpreting results (not separately reported) and communicating results to the patient/ family/caregiver Care coordination (not separately reported)  Note by: Edward Jolly, MD Date: 03/09/2023; Time: 4:05 PM

## 2023-03-10 ENCOUNTER — Telehealth: Payer: Self-pay

## 2023-03-10 DIAGNOSIS — M47816 Spondylosis without myelopathy or radiculopathy, lumbar region: Secondary | ICD-10-CM

## 2023-03-10 NOTE — Telephone Encounter (Signed)
Even though this is a repeat RFA Medicare requires 2 facets in order to request another RFA. She has had one but the previous was over 2 years ago. Do you want to order another MBNB?

## 2023-03-20 ENCOUNTER — Emergency Department: Payer: Medicare Other

## 2023-03-20 ENCOUNTER — Emergency Department
Admission: EM | Admit: 2023-03-20 | Discharge: 2023-03-20 | Disposition: A | Discharge status: Home / Self Care | Payer: Medicare Other | Source: Home / Self Care | Attending: Emergency Medicine | Admitting: Emergency Medicine

## 2023-03-20 ENCOUNTER — Other Ambulatory Visit: Payer: Self-pay

## 2023-03-20 DIAGNOSIS — I110 Hypertensive heart disease with heart failure: Secondary | ICD-10-CM | POA: Insufficient documentation

## 2023-03-20 DIAGNOSIS — W19XXXA Unspecified fall, initial encounter: Secondary | ICD-10-CM

## 2023-03-20 DIAGNOSIS — S82831A Other fracture of upper and lower end of right fibula, initial encounter for closed fracture: Secondary | ICD-10-CM | POA: Insufficient documentation

## 2023-03-20 DIAGNOSIS — M25572 Pain in left ankle and joints of left foot: Secondary | ICD-10-CM | POA: Diagnosis not present

## 2023-03-20 DIAGNOSIS — S8012XA Contusion of left lower leg, initial encounter: Secondary | ICD-10-CM | POA: Insufficient documentation

## 2023-03-20 DIAGNOSIS — Z91040 Latex allergy status: Secondary | ICD-10-CM | POA: Insufficient documentation

## 2023-03-20 DIAGNOSIS — M25571 Pain in right ankle and joints of right foot: Secondary | ICD-10-CM

## 2023-03-20 DIAGNOSIS — W010XXA Fall on same level from slipping, tripping and stumbling without subsequent striking against object, initial encounter: Secondary | ICD-10-CM | POA: Insufficient documentation

## 2023-03-20 DIAGNOSIS — M25561 Pain in right knee: Secondary | ICD-10-CM

## 2023-03-20 DIAGNOSIS — I5032 Chronic diastolic (congestive) heart failure: Secondary | ICD-10-CM | POA: Insufficient documentation

## 2023-03-20 MED ORDER — ACETAMINOPHEN 500 MG PO TABS
1000.0000 mg | ORAL_TABLET | Freq: Once | ORAL | Status: AC
  Administered 2023-03-20: 1000 mg via ORAL
  Filled 2023-03-20: qty 2

## 2023-03-20 NOTE — Discharge Instructions (Addendum)
You may take Tylenol as needed for pain.  Keep splint clean and dry.  Elevate affected area and apply ice over splint to reduce swelling.  Use your walker to help you balance as you walk.  Do not bear weight on right leg until seen by the specialist.  Return to the ER for worsening symptoms, persistent vomiting, difficulty breathing or other concerns.

## 2023-03-20 NOTE — ED Notes (Signed)
This RN, and  to [place], RN to bedside to place pt. On bedpan. Pt. Had large amount of urine. Peri care provided. Linens tidied, and changed as necessary. Pt. Repositioned for comfort. Lights dimmed, warm blankets provided. Pt. Denies further need at this time.

## 2023-03-20 NOTE — ED Notes (Signed)
Pt A&O x4, no obvious distress noted, respirations regular/unlabored. Pt verbalizes understanding of discharge instructions. Pt wheeled from ED without incident.

## 2023-03-20 NOTE — ED Provider Notes (Signed)
[facility] Provider Note    Event Date/Time   First MD Initiated Contact with Patient 03/20/23 [phone]     (approximate)   History   Fall (-loc, -blood thinner)   HPI  [name] is a 77 y.o. female brought to the ED via EMS from home status post mechanical fall with bilateral leg pain.  Patient states she bent down to get something off of her rolling walker and fell.  She did not strike her head did not have LOC and does not take anticoagulants.  States she was not injured in the initial fall.  Called EMS to get her up and states in doing so, EMS accidentally injured both her legs.  Complains of left lower leg pain and bruising, right knee and ankle pain.  Voices no other complaints or injuries.     Past Medical History   Past Medical History:  Diagnosis Date   Benign positional vertigo 03/04/2016   Brain injury (HCC)    Climacteric    Depression    DJD (degenerative joint disease)    lumbar and cervical   Dyslipidemia    GERD (gastroesophageal reflux disease)    Hypertension    Hypokalemia    Hypothyroidism    IBS (irritable bowel syndrome)    OAB (overactive bladder)    Ovarian cyst 03/25/2015   3cm simple cyst on right ovary   Rhinitis    Sleep apnea    Spasm of thoracolumbar muscle      Active Problem List   Patient Active Problem List   Diagnosis Date Noted   Acute renal failure superimposed on stage 3b chronic kidney disease (HCC) 10/27/2022   Weakness 10/27/2022   Venous stasis 10/27/2022   Community acquired pneumonia 07/03/2020   OSA (obstructive sleep apnea) 07/03/2020   CAP (community acquired pneumonia) 07/03/2020   Sepsis (HCC) 07/02/2020   History of anesthesia reaction 02/20/2020   Swelling of limb 02/20/2020   Chronic diastolic CHF (congestive heart failure) (HCC) 08/22/2019   Edema 07/29/2019   Prediabetes 07/07/2019   Depression 04/26/2019   Disease of thyroid gland 04/26/2019   GERD  (gastroesophageal reflux disease) 04/26/2019   History of head injury 04/26/2019   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/26/2019   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   Spasm of lumbar paraspinous muscle 02/14/2019   Chronic pain syndrome 02/14/2019   Use of cane as ambulatory aid 01/04/2019   Acneiform eruption 07/09/2017   Lumbar spondylosis 04/20/2017   Lumbar facet arthropathy 04/20/2017   Lumbar degenerative disc disease 04/20/2017   Anxiety 11/05/2016   Facet arthritis of lumbar region 09/16/2016   Low back pain 07/25/2016   Morbid obesity with BMI of 40.0-44.9, adult (HCC) 06/03/2016   Acquired hypothyroidism 03/04/2016   Cognitive deficit as late effect of traumatic brain injury (HCC) 03/04/2016   Health care maintenance 03/04/2016   HTN, goal below 140/90 03/04/2016   Hypokalemia 03/04/2016   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   Localized primary osteoarthritis of lower leg, right 08/21/2015   Primary osteoarthritis of left knee 04/10/2015   Right knee pain 01/18/2013     Past Surgical History   Past Surgical History:  Procedure Laterality Date   ABDOMINAL HYSTERECTOMY  07/12/2014   ANKLE RECONSTRUCTION Left    BRACHIOPLASTY     BREAST REDUCTION SURGERY Bilateral 2020   CATARACT EXTRACTION     CHOLECYSTECTOMY     COLONOSCOPY WITH PROPOFOL N/A 12/07/2016   Procedure: COLONOSCOPY WITH PROPOFOL;  Surgeon:  Scot Jun, MD;  Location: [facility] END[facility]COPY;  Service: Endoscopy;  Laterality: N/A;   JOINT REPLACEMENT Bilateral 07/2014, 03/2015   nasal reconstrucion     REDUCTION MAMMAPLASTY Bilateral    SHOULDER OPEN ROTATOR CUFF REPAIR     SOFT TISSUE TUMOR RESECTION     excision tumor soft tissue neck, and soft tissue subcutaneous axilla   TOE SURGERY Right    reconstruction right foot 4th and 5th toe     Home Medications   Prior to Admission medications   Medication Sig Start Date End Date Taking? Authorizing Provider  acetaminophen (TYLENOL) 500 MG  tablet Take 500 mg by mouth every 6 (six) hours as needed.    [provider]  amantadine (SYMMETREL) 100 MG capsule Take 1 capsule (100 mg total) by mouth daily. 11/03/22   Esaw Grandchild A, DO  amLODipine (NORVASC) 5 MG tablet Take 5 mg by mouth daily.    [provider]  Ascorbic Acid (VITAMIN C) 1000 MG tablet Take 1,000 mg daily by mouth.    [provider]  baclofen (LIORESAL) 20 MG tablet Take 20 mg by mouth daily as needed for muscle spasms.    [provider]  Biotin 10 MG CAPS Take by mouth daily.    [provider]  carvedilol (COREG) 25 MG tablet Take 50 mg by mouth 2 (two) times daily with a meal.    [provider]  Cholecalciferol (VITAMIN D3) 2000 units TABS Take 2,000 Units daily by mouth.    [provider]  donepezil (ARICEPT) 10 MG tablet Take 10 mg by mouth daily.    [provider]  esomeprazole (NEXIUM) 40 MG capsule Take 40 mg by mouth daily.    [provider]  fluticasone (FLONASE) 50 MCG/ACT nasal spray Place 1 spray into both nostrils daily as needed for rhinitis or allergies.    [provider]  fluticasone furoate-vilanterol (BREO ELLIPTA) 100-25 MCG/INH AEPB Inhale 1 puff into the lungs daily. Patient taking differently: Inhale 1 puff into the lungs daily as needed. 07/07/20   Gillis Santa, MD  folic acid (FOLVITE) 400 MCG tablet Take 400 mcg by mouth daily.    [provider]  levothyroxine (SYNTHROID, LEVOTHROID) 112 MCG tablet Take 112 mcg by mouth daily.    [provider]  losartan (COZAAR) 100 MG tablet Take 100 mg by mouth daily.    [provider]  magnesium 30 MG tablet Take 30 mg by mouth daily.    [provider]  Melatonin 3 MG TABS Take 1 tablet at bedtime by mouth.     [provider]  montelukast (SINGULAIR) 10 MG tablet Take 10 mg by mouth every evening.    [provider]  Multiple Vitamin (MULTIVITAMIN)  tablet Take 1 tablet by mouth daily.    [provider]  nystatin (MYCOSTATIN/NYSTOP) powder Apply topically 2 (two) times daily. Patient taking differently: Apply 1 Application topically 2 (two) times daily as needed. 07/06/20   Gillis Santa, MD  predniSONE (STERAPRED UNI-PAK 21 TAB) 10 MG (21) TBPK tablet Per packaging instructions 02/18/23   Phineas Semen, MD  spironolactone (ALDACTONE) 25 MG tablet Take 25 mg by mouth daily. 10/15/22   [provider]  topiramate (TOPAMAX) 25 MG tablet Take 25 mg by mouth daily.    [provider]  torsemide (DEMADEX) 20 MG tablet Take 20 mg by mouth daily. States she is taking 60mg     [provider]  traZODone (DESYREL)  50 MG tablet Take 50 mg by mouth at bedtime. 50-150mg  nightly as needed    [provider]  venlafaxine XR (EFFEXOR-XR) 75 MG 24 hr capsule Take 150 mg by mouth 2 (two) times daily. 10/17/22   [provider]  zinc gluconate 50 MG tablet Take 50 mg by mouth daily.    [provider]     Allergies  Codeine, Povidone iodine, Hydromorphone, Latex, Oxycodone-acetaminophen, Sulfa antibiotics, Bupropion, Gabapentin, Hydrocodone, Morphine and codeine, and Procaine   Family History   Family History  Problem Relation Age of Onset   Lung cancer Mother    Lung cancer Father    Breast cancer Paternal Aunt      Physical Exam  Triage Vital Signs: ED Triage Vitals  Encounter Vitals Group     BP 03/20/23 0237 (!) 145/72     Systolic BP Percentile --      Diastolic BP Percentile --      Pulse Rate 03/20/23 0237 73     Resp 03/20/23 0237 (!) 5     Temp 03/20/23 0237 98.9 F (37.2 C)     Temp Source 03/20/23 0237 Oral     SpO2 03/20/23 0237 100 %     Weight 03/20/23 0238 243 lb 8 oz (110.5 kg)     Height --      Head Circumference --      Peak Flow --      Pain Score 03/20/23 0238 7     Pain Loc --      Pain Education --      Exclude from Growth Chart --     Updated Vital  Signs: BP (!) 145/72   Pulse 73   Temp 98.9 F (37.2 C) (Oral)   Resp 15   Wt 110.5 kg   SpO2 100%   BMI 39.30 kg/m    General: Awake, no distress.  CV:  Good peripheral perfusion.  Resp:  Normal effort.  Abd:  No distention.  Other:  LLE: Anterior shin with large bruise, tender to palpation.  RLE: Anterior knee tender to palpation with limited range of motion secondary to pain.  Limited range of motion ankle secondary to pain.  2+ distal pulses.  Pedal edema bilaterally.   ED Results / Procedures / Treatments  Labs (all labs ordered are listed, but only abnormal results are displayed) Labs Reviewed - No data to display   EKG  None   RADIOLOGY I have independently visualized and interpreted patient's imaging studies as well as noted the radiology interpretation:  Right ankle: Minimally displaced distal right fibula fracture  Right knee: No acute osseous injury  Left tib/fib: No acute osseous injury  Official radiology report(s): DG Ankle Complete Right  Result Date: 03/20/2023 CLINICAL DATA:  Fall EXAM: RIGHT ANKLE - COMPLETE 3+ VIEW COMPARISON:  None Available. FINDINGS: There is a minimally displaced fracture of the distal right fibula. No tibial fracture. Ankle mortise remains approximated. IMPRESSION: Minimally displaced fracture of the distal right fibula. Electronically Signed   By: Deatra Robinson M.D.   On: 03/20/2023 04:04   DG Knee Complete 4 Views Right  Result Date: 03/20/2023 CLINICAL DATA:  Fall EXAM: RIGHT KNEE - COMPLETE 4+ VIEW COMPARISON:  None Available. FINDINGS: Total knee arthroplasty without adverse features. No acute fracture. No joint effusion. IMPRESSION: No acute abnormality of the right knee. Electronically Signed   By: Deatra Robinson M.D.   On: 03/20/2023 04:03   DG Tibia/Fibula Left  Result Date:  03/20/2023 CLINICAL DATA:  Fall EXAM: LEFT TIBIA AND FIBULA - 2 VIEW COMPARISON:  None Available. FINDINGS: Total knee arthroplasty and calcaneal  lag screw without adverse features. No acute fracture. IMPRESSION: No acute abnormality of the left tibia/fibula. Electronically Signed   By: Deatra Robinson M.D.   On: 03/20/2023 04:02     PROCEDURES:  Critical Care performed: No  Procedures   MEDICATIONS ORDERED IN ED: Medications  acetaminophen (TYLENOL) tablet 1,000 mg (1,000 mg Oral Given 03/20/23 0331)     IMPRESSION / MDM / ASSESSMENT AND PLAN / ED COURSE  I reviewed the triage vital signs and the nursing notes.                             77 year old female presenting with mechanical fall and bilateral leg pain.  Differential diagnosis includes but is not limited to fracture, dislocation, musculoskeletal contusion, etc.  I personally reviewed patient's records and note a recent telephone call with her PCP on 03/12/2023 regarding social situation, unable to care for her husband who has recently been placed in a facility.  Patient's presentation is most consistent with acute, uncomplicated illness.  0410 X-rays demonstrate minimal right fibula fracture.  Will place in splint, patient already has walker and follow-up with orthopedics.  Strict return precautions given.  Patient verbalizes understanding and agrees with plan of care.  [id number] Patient's family cannot be here until 10 AM.  She will remain in the ED until that time for discharge.  FINAL CLINICAL IMPRESSION(S) / ED DIAGNOSES   Final diagnoses:  Fall, initial encounter  Contusion of left lower leg, initial encounter  Acute pain of right knee  Acute right ankle pain  Closed fracture of distal end of right fibula, unspecified fracture morphology, initial encounter     Rx / DC Orders   ED Discharge Orders     None        Note:  This document was prepared using Dragon voice recognition software and may include unintentional dictation errors.   Irean Hong, MD 03/20/23 [phone]

## 2023-03-20 NOTE — ED Triage Notes (Signed)
Pt got up to go to the bathroom and states something feel off her walker. When she went to bend down she was too weak and fell on her R side. Pt did not hit head, - loc, and -blood thinners. Pt has a bruise on L leg and pain on R leg. Pt states this is from ems accidentally hurting her. She states they were struggling to get her off the floor and she fell on her knees when they were trying to get her up. Pt states pain was minor until the fall with ems and is now 7/10 on both legs. A&O x4.   Past Medical History:  Diagnosis Date   Benign positional vertigo 03/04/2016   Brain injury (HCC)    Climacteric    Depression    DJD (degenerative joint disease)    lumbar and cervical   Dyslipidemia    GERD (gastroesophageal reflux disease)    Hypertension    Hypokalemia    Hypothyroidism    IBS (irritable bowel syndrome)    OAB (overactive bladder)    Ovarian cyst 03/25/2015   3cm simple cyst on right ovary   Rhinitis    Sleep apnea    Spasm of thoracolumbar muscle

## 2023-03-22 ENCOUNTER — Other Ambulatory Visit: Payer: Self-pay

## 2023-03-22 ENCOUNTER — Emergency Department: Payer: Medicare Other

## 2023-03-22 ENCOUNTER — Inpatient Hospital Stay
Admission: EM | Admit: 2023-03-22 | Discharge: 2023-03-29 | DRG: 563 | Disposition: A | Discharge status: Skilled Nursing Facility | Payer: Medicare Other | Attending: Internal Medicine | Admitting: Internal Medicine

## 2023-03-22 DIAGNOSIS — S82831A Other fracture of upper and lower end of right fibula, initial encounter for closed fracture: Principal | ICD-10-CM | POA: Diagnosis present

## 2023-03-22 DIAGNOSIS — Z883 Allergy status to other anti-infective agents status: Secondary | ICD-10-CM

## 2023-03-22 DIAGNOSIS — S9032XA Contusion of left foot, initial encounter: Secondary | ICD-10-CM

## 2023-03-22 DIAGNOSIS — Z6841 Body Mass Index (BMI) 40.0 and over, adult: Secondary | ICD-10-CM

## 2023-03-22 DIAGNOSIS — Z882 Allergy status to sulfonamides status: Secondary | ICD-10-CM

## 2023-03-22 DIAGNOSIS — S82392D Other fracture of lower end of left tibia, subsequent encounter for closed fracture with routine healing: Secondary | ICD-10-CM

## 2023-03-22 DIAGNOSIS — S82831D Other fracture of upper and lower end of right fibula, subsequent encounter for closed fracture with routine healing: Principal | ICD-10-CM

## 2023-03-22 DIAGNOSIS — F0394 Unspecified dementia, unspecified severity, with anxiety: Secondary | ICD-10-CM | POA: Diagnosis present

## 2023-03-22 DIAGNOSIS — N3281 Overactive bladder: Secondary | ICD-10-CM | POA: Diagnosis present

## 2023-03-22 DIAGNOSIS — Y92009 Unspecified place in unspecified non-institutional (private) residence as the place of occurrence of the external cause: Secondary | ICD-10-CM

## 2023-03-22 DIAGNOSIS — Z7951 Long term (current) use of inhaled steroids: Secondary | ICD-10-CM

## 2023-03-22 DIAGNOSIS — G4730 Sleep apnea, unspecified: Secondary | ICD-10-CM | POA: Diagnosis present

## 2023-03-22 DIAGNOSIS — S8012XA Contusion of left lower leg, initial encounter: Secondary | ICD-10-CM | POA: Diagnosis present

## 2023-03-22 DIAGNOSIS — N1831 Chronic kidney disease, stage 3a: Secondary | ICD-10-CM | POA: Diagnosis present

## 2023-03-22 DIAGNOSIS — R5381 Other malaise: Secondary | ICD-10-CM | POA: Diagnosis present

## 2023-03-22 DIAGNOSIS — Z79899 Other long term (current) drug therapy: Secondary | ICD-10-CM

## 2023-03-22 DIAGNOSIS — I10 Essential (primary) hypertension: Secondary | ICD-10-CM

## 2023-03-22 DIAGNOSIS — K589 Irritable bowel syndrome without diarrhea: Secondary | ICD-10-CM | POA: Diagnosis present

## 2023-03-22 DIAGNOSIS — M1990 Unspecified osteoarthritis, unspecified site: Secondary | ICD-10-CM | POA: Diagnosis present

## 2023-03-22 DIAGNOSIS — S82409A Unspecified fracture of shaft of unspecified fibula, initial encounter for closed fracture: Secondary | ICD-10-CM | POA: Diagnosis present

## 2023-03-22 DIAGNOSIS — E785 Hyperlipidemia, unspecified: Secondary | ICD-10-CM | POA: Diagnosis present

## 2023-03-22 DIAGNOSIS — N3946 Mixed incontinence: Secondary | ICD-10-CM | POA: Diagnosis present

## 2023-03-22 DIAGNOSIS — R531 Weakness: Principal | ICD-10-CM | POA: Diagnosis present

## 2023-03-22 DIAGNOSIS — Z79890 Hormone replacement therapy (postmenopausal): Secondary | ICD-10-CM

## 2023-03-22 DIAGNOSIS — K219 Gastro-esophageal reflux disease without esophagitis: Secondary | ICD-10-CM | POA: Diagnosis present

## 2023-03-22 DIAGNOSIS — F32A Depression, unspecified: Secondary | ICD-10-CM | POA: Diagnosis present

## 2023-03-22 DIAGNOSIS — I130 Hypertensive heart and chronic kidney disease with heart failure and stage 1 through stage 4 chronic kidney disease, or unspecified chronic kidney disease: Secondary | ICD-10-CM | POA: Diagnosis present

## 2023-03-22 DIAGNOSIS — I5032 Chronic diastolic (congestive) heart failure: Secondary | ICD-10-CM | POA: Diagnosis present

## 2023-03-22 DIAGNOSIS — W19XXXA Unspecified fall, initial encounter: Secondary | ICD-10-CM | POA: Diagnosis present

## 2023-03-22 DIAGNOSIS — Z9109 Other allergy status, other than to drugs and biological substances: Secondary | ICD-10-CM

## 2023-03-22 DIAGNOSIS — Z91040 Latex allergy status: Secondary | ICD-10-CM

## 2023-03-22 DIAGNOSIS — Z885 Allergy status to narcotic agent status: Secondary | ICD-10-CM

## 2023-03-22 DIAGNOSIS — N390 Urinary tract infection, site not specified: Secondary | ICD-10-CM | POA: Diagnosis present

## 2023-03-22 DIAGNOSIS — Z884 Allergy status to anesthetic agent status: Secondary | ICD-10-CM

## 2023-03-22 DIAGNOSIS — Z90710 Acquired absence of both cervix and uterus: Secondary | ICD-10-CM

## 2023-03-22 DIAGNOSIS — S82202A Unspecified fracture of shaft of left tibia, initial encounter for closed fracture: Secondary | ICD-10-CM | POA: Diagnosis present

## 2023-03-22 DIAGNOSIS — E039 Hypothyroidism, unspecified: Secondary | ICD-10-CM | POA: Diagnosis present

## 2023-03-22 DIAGNOSIS — Z5971 Insufficient health insurance coverage: Secondary | ICD-10-CM

## 2023-03-22 DIAGNOSIS — Z23 Encounter for immunization: Secondary | ICD-10-CM

## 2023-03-22 HISTORY — DX: Heart failure, unspecified: I50.9

## 2023-03-22 LAB — URINALYSIS, ROUTINE W REFLEX MICROSCOPIC
Bilirubin Urine: NEGATIVE
Glucose, UA: NEGATIVE mg/dL
Hgb urine dipstick: NEGATIVE
Ketones, ur: NEGATIVE mg/dL
Nitrite: POSITIVE — AB
Protein, ur: NEGATIVE mg/dL
Specific Gravity, Urine: 1.013 (ref 1.005–1.030)
pH: 7 (ref 5.0–8.0)

## 2023-03-22 LAB — CBC WITH DIFFERENTIAL/PLATELET
Abs Immature Granulocytes: 0.05 10*3/uL (ref 0.00–0.07)
Basophils Absolute: 0.1 10*3/uL (ref 0.0–0.1)
Basophils Relative: 1 %
Eosinophils Absolute: 0.2 10*3/uL (ref 0.0–0.5)
Eosinophils Relative: 2 %
HCT: 31.4 % — ABNORMAL LOW (ref 36.0–46.0)
Hemoglobin: 10.6 g/dL — ABNORMAL LOW (ref 12.0–15.0)
Immature Granulocytes: 1 %
Lymphocytes Relative: 17 %
Lymphs Abs: 1.8 10*3/uL (ref 0.7–4.0)
MCH: 32.7 pg (ref 26.0–34.0)
MCHC: 33.8 g/dL (ref 30.0–36.0)
MCV: 96.9 fL (ref 80.0–100.0)
Monocytes Absolute: 0.7 10*3/uL (ref 0.1–1.0)
Monocytes Relative: 7 %
Neutro Abs: 7.8 10*3/uL — ABNORMAL HIGH (ref 1.7–7.7)
Neutrophils Relative %: 72 %
Platelets: 224 10*3/uL (ref 150–400)
RBC: 3.24 MIL/uL — ABNORMAL LOW (ref 3.87–5.11)
RDW: 12.4 % (ref 11.5–15.5)
WBC: 10.7 10*3/uL — ABNORMAL HIGH (ref 4.0–10.5)
nRBC: 0 % (ref 0.0–0.2)

## 2023-03-22 LAB — BASIC METABOLIC PANEL
Anion gap: 8 (ref 5–15)
BUN: 20 mg/dL (ref 8–23)
CO2: 22 mmol/L (ref 22–32)
Calcium: 9.1 mg/dL (ref 8.9–10.3)
Chloride: 108 mmol/L (ref 98–111)
Creatinine, Ser: 0.94 mg/dL (ref 0.44–1.00)
GFR, Estimated: >60 mL/min (ref >60)
Glucose, Bld: 97 mg/dL (ref 70–99)
Potassium: 4.4 mmol/L (ref 3.5–5.1)
Sodium: 138 mmol/L (ref 135–145)

## 2023-03-22 LAB — CK: Total CK: 39 U/L (ref 38–234)

## 2023-03-22 MED ORDER — CARVEDILOL 25 MG PO TABS
50.0000 mg | ORAL_TABLET | Freq: Two times a day (BID) | ORAL | Status: DC
  Administered 2023-03-23 – 2023-03-29 (×12): 50 mg via ORAL
  Filled 2023-03-22 (×3): qty 2
  Filled 2023-03-22: qty 8
  Filled 2023-03-22 (×8): qty 2

## 2023-03-22 MED ORDER — PANTOPRAZOLE SODIUM 40 MG PO TBEC
40.0000 mg | DELAYED_RELEASE_TABLET | Freq: Every day | ORAL | Status: DC
  Administered 2023-03-23 – 2023-03-29 (×7): 40 mg via ORAL
  Filled 2023-03-22 (×7): qty 1

## 2023-03-22 MED ORDER — SPIRONOLACTONE 25 MG PO TABS
25.0000 mg | ORAL_TABLET | Freq: Every day | ORAL | Status: DC
  Administered 2023-03-23 – 2023-03-29 (×7): 25 mg via ORAL
  Filled 2023-03-22 (×7): qty 1

## 2023-03-22 MED ORDER — TOPIRAMATE 25 MG PO TABS
25.0000 mg | ORAL_TABLET | Freq: Every day | ORAL | Status: DC
  Administered 2023-03-23 – 2023-03-29 (×7): 25 mg via ORAL
  Filled 2023-03-22 (×7): qty 1

## 2023-03-22 MED ORDER — MONTELUKAST SODIUM 10 MG PO TABS
10.0000 mg | ORAL_TABLET | Freq: Every evening | ORAL | Status: DC
  Administered 2023-03-22 – 2023-03-28 (×7): 10 mg via ORAL
  Filled 2023-03-22 (×7): qty 1

## 2023-03-22 MED ORDER — VENLAFAXINE HCL ER 150 MG PO CP24
150.0000 mg | ORAL_CAPSULE | Freq: Two times a day (BID) | ORAL | Status: DC
  Administered 2023-03-22 – 2023-03-29 (×14): 150 mg via ORAL
  Filled 2023-03-22 (×15): qty 1

## 2023-03-22 MED ORDER — MELATONIN 5 MG PO TABS
5.0000 mg | ORAL_TABLET | Freq: Every day | ORAL | Status: DC
  Administered 2023-03-22 – 2023-03-28 (×7): 5 mg via ORAL
  Filled 2023-03-22 (×7): qty 1

## 2023-03-22 MED ORDER — VITAMIN C 500 MG PO TABS
1000.0000 mg | ORAL_TABLET | Freq: Every day | ORAL | Status: DC
  Administered 2023-03-23 – 2023-03-29 (×7): 1000 mg via ORAL
  Filled 2023-03-22 (×7): qty 2

## 2023-03-22 MED ORDER — LOSARTAN POTASSIUM 50 MG PO TABS
100.0000 mg | ORAL_TABLET | Freq: Every day | ORAL | Status: DC
  Administered 2023-03-22 – 2023-03-29 (×8): 100 mg via ORAL
  Filled 2023-03-22 (×8): qty 2

## 2023-03-22 MED ORDER — METOPROLOL TARTRATE 5 MG/5ML IV SOLN: 5.0000 mg | Freq: Four times a day (QID) | INTRAVENOUS | Status: DC | PRN

## 2023-03-22 MED ORDER — DONEPEZIL HCL 5 MG PO TABS
10.0000 mg | ORAL_TABLET | Freq: Every day | ORAL | Status: DC
  Administered 2023-03-23 – 2023-03-29 (×7): 10 mg via ORAL
  Filled 2023-03-22 (×7): qty 2

## 2023-03-22 MED ORDER — LEVOTHYROXINE SODIUM 112 MCG PO TABS
112.0000 ug | ORAL_TABLET | Freq: Every day | ORAL | Status: DC
  Administered 2023-03-23 – 2023-03-29 (×7): 112 ug via ORAL
  Filled 2023-03-22 (×8): qty 1

## 2023-03-22 MED ORDER — ONDANSETRON HCL 4 MG PO TABS: 4.0000 mg | ORAL_TABLET | Freq: Four times a day (QID) | ORAL | Status: DC | PRN

## 2023-03-22 MED ORDER — VITAMIN D 25 MCG (1000 UNIT) PO TABS
2000.0000 [IU] | ORAL_TABLET | Freq: Every day | ORAL | Status: DC
  Administered 2023-03-23 – 2023-03-29 (×7): 2000 [IU] via ORAL
  Filled 2023-03-22 (×7): qty 2

## 2023-03-22 MED ORDER — ACETAMINOPHEN 500 MG PO TABS
500.0000 mg | ORAL_TABLET | Freq: Four times a day (QID) | ORAL | Status: DC | PRN
  Administered 2023-03-23 – 2023-03-29 (×14): 500 mg via ORAL
  Filled 2023-03-22 (×15): qty 1

## 2023-03-22 MED ORDER — BACLOFEN 10 MG PO TABS
20.0000 mg | ORAL_TABLET | Freq: Every day | ORAL | Status: DC | PRN
  Administered 2023-03-26: 20 mg via ORAL
  Filled 2023-03-22 (×2): qty 2

## 2023-03-22 MED ORDER — FOLIC ACID 1 MG PO TABS
500.0000 ug | ORAL_TABLET | Freq: Every day | ORAL | Status: DC
  Administered 2023-03-23 – 2023-03-29 (×7): 0.5 mg via ORAL
  Filled 2023-03-22 (×7): qty 1

## 2023-03-22 MED ORDER — SENNOSIDES-DOCUSATE SODIUM 8.6-50 MG PO TABS: 1.0000 | ORAL_TABLET | Freq: Every evening | ORAL | Status: DC | PRN

## 2023-03-22 MED ORDER — ONDANSETRON HCL 4 MG/2ML IJ SOLN: 4.0000 mg | Freq: Four times a day (QID) | INTRAMUSCULAR | Status: DC | PRN

## 2023-03-22 MED ORDER — FLUTICASONE FUROATE-VILANTEROL 100-25 MCG/INH IN AEPB
1.0000 | INHALATION_SPRAY | Freq: Every day | RESPIRATORY_TRACT | Status: DC
  Filled 2023-03-22: qty 1

## 2023-03-22 MED ORDER — AMLODIPINE BESYLATE 5 MG PO TABS
5.0000 mg | ORAL_TABLET | Freq: Every day | ORAL | Status: DC
  Administered 2023-03-23 – 2023-03-29 (×7): 5 mg via ORAL
  Filled 2023-03-22 (×7): qty 1

## 2023-03-22 MED ORDER — TORSEMIDE 20 MG PO TABS: 20.0000 mg | ORAL_TABLET | Freq: Every day | ORAL | Status: DC

## 2023-03-22 MED ORDER — AMANTADINE HCL 100 MG PO CAPS: 100.0000 mg | ORAL_CAPSULE | Freq: Every day | ORAL | Status: DC

## 2023-03-22 MED ORDER — TRAZODONE HCL 50 MG PO TABS
50.0000 mg | ORAL_TABLET | Freq: Every day | ORAL | Status: DC
  Administered 2023-03-22 – 2023-03-28 (×7): 50 mg via ORAL
  Filled 2023-03-22 (×7): qty 1

## 2023-03-22 MED ORDER — HEPARIN SODIUM (PORCINE) 5000 UNIT/ML IJ SOLN
5000.0000 [IU] | Freq: Three times a day (TID) | INTRAMUSCULAR | Status: DC
  Administered 2023-03-22 – 2023-03-29 (×20): 5000 [IU] via SUBCUTANEOUS
  Filled 2023-03-22 (×20): qty 1

## 2023-03-22 NOTE — ED Provider Notes (Signed)
[facility] Provider Note    Event Date/Time   First MD Initiated Contact with Patient 03/22/23 1155     (approximate)   History   Chief Complaint Fall (Pt seen here for fall x2 days ago. Per pt/EMS she has been sitting in the same spot for over 48hrs.)   HPI  [name] is a 77 y.o. female with past medical history of hypertension, CHF, CKD, and hypothyroidism who presents to the ED following fall.  Patient reports that 3 days ago she fell at home and had EMS come to help her back up.  She states that in doing so, she was dropped to the ground and injured both legs.  She was evaluated in the ED at that time, found to have fracture of her right distal fibula.  She was placed in a splint and discharged home, but has been unable to walk since then.  She states that she has had 2 laying in her recliner for at least the past 24 hours, lives by herself and does not have any help.  She continues to have pain around her left ankle as well as around a hematoma to her left lower leg.  She denies any more recent falls.     Physical Exam   Triage Vital Signs: ED Triage Vitals  Encounter Vitals Group     BP --      Systolic BP Percentile --      Diastolic BP Percentile --      Pulse --      Resp --      Temp 03/22/23 1146 98.8 F (37.1 C)     Temp src --      SpO2 --      Weight 03/22/23 1147 249 lb (112.9 kg)     Height 03/22/23 1147 5\' 6"  (1.676 m)     Head Circumference --      Peak Flow --      Pain Score 03/22/23 1146 7     Pain Loc --      Pain Education --      Exclude from Growth Chart --     Most recent vital signs: Vitals:   03/22/23 1146 03/22/23 1200  BP:  139/67  Pulse:  83  Resp:  19  Temp: 98.8 F (37.1 C)   SpO2:  100%    Constitutional: Alert and oriented. Eyes: Conjunctivae are normal. Head: Atraumatic. Nose: No congestion/rhinnorhea. Mouth/Throat: Mucous membranes are moist.  Cardiovascular: Normal rate,  regular rhythm. Grossly normal heart sounds.  2+ radial pulses bilaterally. Respiratory: Normal respiratory effort.  No retractions. Lungs CTAB. Gastrointestinal: Soft and nontender. No distention. Musculoskeletal: No lower extremity tenderness nor edema.  Neurologic:  Normal speech and language. No gross focal neurologic deficits are appreciated.    ED Results / Procedures / Treatments   Labs (all labs ordered are listed, but only abnormal results are displayed) Labs Reviewed  CBC WITH DIFFERENTIAL/PLATELET - Abnormal; Notable for the following components:      Result Value   WBC 10.7 (*)    RBC 3.24 (*)    Hemoglobin 10.6 (*)    HCT 31.4 (*)    Neutro Abs 7.8 (*)    All other components within normal limits  BASIC METABOLIC PANEL  URINALYSIS, ROUTINE W REFLEX MICROSCOPIC  CK     EKG  ED ECG REPORT I, Chesley Noon, the attending physician, personally viewed and interpreted this ECG.   Date:  03/22/2023  EKG Time: 11:57  Rate: 83  Rhythm: normal sinus rhythm  Axis: Normal  Intervals: Borderline prolonged QT  ST&T Change: None  PROCEDURES:  Critical Care performed: No  Procedures   MEDICATIONS ORDERED IN ED: Medications - No data to display   IMPRESSION / MDM / ASSESSMENT AND PLAN / ED COURSE  I reviewed the triage vital signs and the nursing notes.                              77 y.o. female with past medical history of hypertension, CHF, CKD, and hypothyroidism who presents to the ED complaining of ongoing right ankle pain and difficulty walking following a fall 3 days ago.  Patient's presentation is most consistent with acute presentation with potential threat to life or bodily function.  Differential diagnosis includes, but is not limited to, dehydration, lecture abnormality, AKI, rhabdomyolysis, UTI.  Patient nontoxic-appearing and in no acute distress, vital signs are unremarkable.  She has a splint in place to her right lower leg, cap refill less  than 2 seconds throughout toes of right foot and she is able to wiggle the toes.  No new fall to to necessitate reimaging at this time.  Labs are reassuring with no significant anemia, leukocytosis, tract abnormality, or AKI.  We will add on CK level, EKG shows no evidence of arrhythmia or ischemia.  CK level within normal limits, case discussed with hospitalist for admission.  We will repeat x-ray at hospitalist request.      FINAL CLINICAL IMPRESSION(S) / ED DIAGNOSES   Final diagnoses:  Generalized weakness  Closed fracture of distal end of right fibula with routine healing, unspecified fracture morphology, subsequent encounter     Rx / DC Orders   ED Discharge Orders     None        Note:  This document was prepared using Dragon voice recognition software and may include unintentional dictation errors.   Chesley Noon, MD 03/22/23 [phone]

## 2023-03-22 NOTE — ED Notes (Addendum)
Pt given partial bed bath by Mechele Dawley and [place] NT. Pt dressed in gown. Linens changed.

## 2023-03-22 NOTE — H&P (Signed)
History and Physical    Patient: [name] YQM:[id number] DOB: 11-06-1945 DOA: 03/22/2023 DOS: the patient was seen and examined on 03/22/2023 PCP: Lauro Regulus, MD  Patient coming from: Home  Chief Complaint:  Chief Complaint  Patient presents with   Fall    Pt seen here for fall x2 days ago. Per pt/EMS she has been sitting in the same spot for over 48hrs.   HPI: [name] is a 77 y.o. female with medical history significant of morbid obesity, chronic diastolic CHF, stage III CKD, hypertension, hypothyroidism, depression, and cognitive impairment.  Patient had presented to the emergency room 2 days prior after a mechanical fall at home on a level ground.  Post fall, patient was unable to get up by herself.  She called EMS and was brought into the emergency room after she was noted to have difficulty with ambulation.  She had complaints of left ankle joint pain and right knee joint pain.  Workup in the ED revealed minimally displaced right fibular fracture.  Patient's foot was placed in a splint and was subsequently discharged home with plans for outpatient follow-up with orthopedics.  As per patient, postdischarge, she has been unable to walk with the use of her rolling walker.  She has remained in her recliner.  She has no help at home hence has been peeing on herself.  She denies any chest pain, shortness of breath.  Denies any nausea or vomiting.   Review of Systems: As mentioned in the history of present illness. All other systems reviewed and are negative. Past Medical History:  Diagnosis Date   Benign positional vertigo 03/04/2016   Brain injury [facility])    CHF (congestive heart failure) (HCC)    Climacteric    Depression    DJD (degenerative joint disease)    lumbar and cervical   Dyslipidemia    GERD (gastroesophageal reflux disease)    Hypertension    Hypokalemia    Hypothyroidism    IBS (irritable bowel syndrome)    OAB  (overactive bladder)    Ovarian cyst 03/25/2015   3cm simple cyst on right ovary   Rhinitis    Sleep apnea    Spasm of thoracolumbar muscle    Past Surgical History:  Procedure Laterality Date   ABDOMINAL HYSTERECTOMY  07/12/2014   ANKLE RECONSTRUCTION Left    BRACHIOPLASTY     BREAST REDUCTION SURGERY Bilateral 2020   CATARACT EXTRACTION     CHOLECYSTECTOMY     COLONOSCOPY WITH PROPOFOL N/A 12/07/2016   Procedure: COLONOSCOPY WITH PROPOFOL;  Surgeon: Scot Jun, MD;  [facility] ENDOSCOPY;  Service: Endoscopy;  Laterality: N/A;   JOINT REPLACEMENT Bilateral 07/2014, 03/2015   nasal reconstrucion     REDUCTION MAMMAPLASTY Bilateral    SHOULDER OPEN ROTATOR CUFF REPAIR     SOFT TISSUE TUMOR RESECTION     excision tumor soft tissue neck, and soft tissue subcutaneous axilla   TOE SURGERY Right    reconstruction right foot 4th and 5th toe   Social History:  reports that she has never smoked. She has never used smokeless tobacco. She reports current alcohol use of about 2.0 standard drinks of alcohol per week. She reports that she does not use drugs.  Allergies  Allergen Reactions   Codeine Other (See Comments)    Seizure. Tolerates tramadol   Povidone Iodine Shortness Of Breath    Patient had difficulty breathing during procedure where dura prep was used to clean  back.    Hydromorphone Hives, Itching and Other (See Comments)    Severe itching hallucinations   Latex Itching and Other (See Comments)    blisters   Oxycodone-Acetaminophen Nausea And Vomiting and Itching   Sulfa Antibiotics Itching and Swelling    Swelling eyes, hands, feet. Was at home and didn't go to the hospital   Bupropion Other (See Comments)   Gabapentin Other (See Comments)    tremors   Hydrocodone     Tolerates tramadol   Morphine And Codeine Itching and Rash   Procaine     Family History  Problem Relation Age of Onset   Lung cancer Mother    Lung cancer Father    Breast cancer Paternal  Aunt     Prior to Admission medications   Medication Sig Start Date End Date Taking? Authorizing Provider  acetaminophen (TYLENOL) 500 MG tablet Take 500 mg by mouth every 6 (six) hours as needed.    [provider]  amantadine (SYMMETREL) 100 MG capsule Take 1 capsule (100 mg total) by mouth daily. 11/03/22   Esaw Grandchild A, DO  amLODipine (NORVASC) 5 MG tablet Take 5 mg by mouth daily.    [provider]  Ascorbic Acid (VITAMIN C) 1000 MG tablet Take 1,000 mg daily by mouth.    [provider]  baclofen (LIORESAL) 20 MG tablet Take 20 mg by mouth daily as needed for muscle spasms.    [provider]  Biotin 10 MG CAPS Take by mouth daily.    [provider]  carvedilol (COREG) 25 MG tablet Take 50 mg by mouth 2 (two) times daily with a meal.    [provider]  Cholecalciferol (VITAMIN D3) 2000 units TABS Take 2,000 Units daily by mouth.    [provider]  donepezil (ARICEPT) 10 MG tablet Take 10 mg by mouth daily.    [provider]  esomeprazole (NEXIUM) 40 MG capsule Take 40 mg by mouth daily.    [provider]  fluticasone (FLONASE) 50 MCG/ACT nasal spray Place 1 spray into both nostrils daily as needed for rhinitis or allergies.    [provider]  fluticasone furoate-vilanterol (BREO ELLIPTA) 100-25 MCG/INH AEPB Inhale 1 puff into the lungs daily. Patient taking differently: Inhale 1 puff into the lungs daily as needed. 07/07/20   Gillis Santa, MD  folic acid (FOLVITE) 400 MCG tablet Take 400 mcg by mouth daily.    [provider]  levothyroxine (SYNTHROID, LEVOTHROID) 112 MCG tablet Take 112 mcg by mouth daily.    [provider]  losartan (COZAAR) 100 MG tablet Take 100 mg by mouth daily.    [provider]  magnesium 30 MG tablet Take 30 mg by mouth daily.    [provider]  Melatonin 3 MG TABS Take 1 tablet at bedtime by mouth.     [provider]  montelukast (SINGULAIR) 10 MG tablet Take 10 mg by mouth every evening.    [provider]  Multiple Vitamin (MULTIVITAMIN) tablet Take 1 tablet by mouth daily.    [provider]  nystatin (MYCOSTATIN/NYSTOP) powder Apply topically 2 (two) times daily. Patient taking differently: Apply 1 Application topically 2 (two) times daily as needed. 07/06/20   Gillis Santa, MD  predniSONE (STERAPRED UNI-PAK 21 TAB) 10 MG (21) TBPK tablet Per packaging instructions 02/18/23   Phineas Semen, MD  spironolactone (ALDACTONE) 25 MG tablet Take 25 mg by mouth daily. 10/15/22   [provider]  topiramate (TOPAMAX) 25 MG tablet Take 25 mg by mouth daily.    [provider]  torsemide (DEMADEX) 20 MG tablet Take 20 mg by mouth daily. States she is taking 60mg     [provider]  traZODone (DESYREL) 50 MG tablet Take 50 mg by mouth at bedtime. 50-150mg  nightly as needed    [provider]  venlafaxine XR (EFFEXOR-XR) 75 MG 24 hr capsule Take 150 mg by mouth 2 (two) times daily. 10/17/22   [provider]  zinc gluconate 50 MG tablet Take 50 mg by mouth daily.    [provider]    Physical Exam: Vitals:   03/22/23 1146 03/22/23 1147 03/22/23 1200  BP:   139/67  Pulse:   83  Resp:   19  Temp: 98.8 F (37.1 C)    SpO2:   100%  Weight:  112.9 kg   Height:  5\' 6"  (1.676 m)    Patient is an obese Caucasian female who is laying comfortably in bed.  She was in a supine position.  She is alert oriented x 3. HEENT: Oral mucosa moist, neck supple with no JVD. Chest: Clinically diminished bilaterally Cardiovascular: Regular S1-S2 with no murmur. Abdomen: Rotund soft, nontender.  Bowel sounds present. Extremities significant for bilateral lower extremity edema.  Left shin hematoma appreciated.  Tender to touch.  Right lower extremity with splint in place and dressing.  Unable to visualize skin. CNS: No focal deficits appreciated this  time. Psych: Mood stable Data Reviewed: Labs reviewed showed serum sodium 138, potassium 4.4 chloride 108, bicarb 22, creatinine 0.94, WBC 10.6, Hb 10.7, hematocrit 31.4, platelet count 224  Xray of right ankle on 10/5 : minimally displaced fracture of the distal right fibula.  Right ankle x-ray ordered and pending.   Assessment and Plan:  #Subacute closed minimally displaced right distal fibula fracture. Optimize pain management with Tylenol.  Patient reports allergies to opiates.  Seems to be tolerating pain management with Tylenol at this time.  Consider gabapentin as an alternate pain medication.  Orthopedics will be consulted to help  # Acute debility: Unable to ambulate due to patient's obesity and pain.  Physical therapy recreational therapy has been consulted to help with optimization of ambulation needs.  #2.  Chronic diastolic heart failure: Patient looks compensated at this time.  No evidence of acute exacerbation at this time.  Patient would continue with goal directed medical treatment that include home medications of Aldactone, torsemide and metolazone. Daily weight monitoring.  Fluid restrictions advised during the course of stay.  #3.  Bilateral lower extremity edema:  #4.  Morbid obesity with BMI of 40  #5.  Essential hypertension: Blood pressure stable on Coreg and amlodipine.  #6: Hypothyroidism: Continue Synthroid.  #7: Chronic anxiety disorder on Effexor.  #8: Dementia/cognitive impairment: Patient is on Aricept and amantadine.     Advance Care Planning:   Code Status: Full Code   Consults: Orthopedics  Family Communication: No family avaialable at this time  Severity of Illness: The appropriate patient status for this patient is OBSERVATION. Observation status is judged to be reasonable and necessary in order to provide the required intensity of service to ensure the patient's safety. The patient's presenting symptoms, physical exam findings, and initial  radiographic and laboratory data in the context of their medical condition is felt to place them at decreased risk for further clinical deterioration. Furthermore, it is anticipated that the patient will be medically stable for discharge from the  hospital within 2 midnights of admission.   Author: Lilia Pro, MD 03/22/2023 2:23 PM  For on call review [email or web address].

## 2023-03-22 NOTE — ED Triage Notes (Signed)
Pt in from home via EMS. Pt d/c from this facility x2 days ago for a fall where pt had been sitting on the floor without help for 48hrs. DSS called EMS today due to new weakness and pt unable to walk. Per pt she is c/o bilateral knee pain from where "EMS dropped me".  Vitals per EMS:  HR-100 CO2-20-24 RR-20-30 BP-140's/70's T-98.8

## 2023-03-22 NOTE — TOC Initial Note (Signed)
Transition of Care [facility]) - Initial/Assessment Note    Patient Details  Name: [name] MRN: [id number] Date of Birth: [birth year]  [facility]) CM/SW Contact:    Marquita Palms, LCSW Phone Number: 03/22/2023, 3:44 PM  Clinical Narrative:                  CSW received a phone call from [facility] DSS SW concerning this patient. Patient has insurance and PCP.       Patient Goals and CMS Choice            Expected Discharge Plan and Services                                              Prior Living Arrangements/Services                       Activities of Daily Living      Permission Sought/Granted                  Emotional Assessment              Admission diagnosis:  Closed left tibial fracture [S82.202A] Patient Active Problem List   Diagnosis Date Noted   Closed left tibial fracture 03/22/2023   Acute renal failure superimposed on stage 3b chronic kidney disease (HCC) 10/27/2022   Weakness 10/27/2022   Venous stasis 10/27/2022   Community acquired pneumonia 07/03/2020   OSA (obstructive sleep apnea) 07/03/2020   CAP (community acquired pneumonia) 07/03/2020   Sepsis (HCC) 07/02/2020   History of anesthesia reaction 02/20/2020   Swelling of limb 02/20/2020   Chronic diastolic CHF (congestive heart failure) (HCC) 08/22/2019   Edema 07/29/2019   Prediabetes 07/07/2019   Depression 04/26/2019   Disease of thyroid gland 04/26/2019   GERD (gastroesophageal reflux disease) 04/26/2019   History of head injury 04/26/2019   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/26/2019   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   Spasm of lumbar paraspinous muscle 02/14/2019   Chronic pain syndrome 02/14/2019   Use of cane as ambulatory aid 01/04/2019   Acneiform eruption 07/09/2017   Lumbar spondylosis 04/20/2017   Lumbar facet arthropathy 04/20/2017   Lumbar degenerative disc disease 04/20/2017   Anxiety  11/05/2016   Facet arthritis of lumbar region 09/16/2016   Low back pain 07/25/2016   Morbid obesity with BMI of 40.0-44.9, adult (HCC) 06/03/2016   Acquired hypothyroidism 03/04/2016   Cognitive deficit as late effect of traumatic brain injury (HCC) 03/04/2016   Health care maintenance 03/04/2016   HTN, goal below 140/90 03/04/2016   Hypokalemia 03/04/2016   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 [place] [address] [place] [address] Phone: [phone] Fax: [phone]  [id number] - [place], [place] - [address] AT [facility] [address] [facility] [address] [place] [address] Phone: [phone] Fax: [phone]     Social Determinants of Health (SDOH) Social History: SDOH Screenings   Food Insecurity: No Food Insecurity (10/27/2022)  Housing:  Low Risk  (10/27/2022)  Transportation Needs: No Transportation Needs (10/27/2022)  Utilities: Not At Risk (10/27/2022)  Depression (PHQ2-9): Low Risk  (11/11/2022)  Social Connections: Unknown (10/26/2021)   Received from [facility]  Tobacco Use: Low Risk  (03/22/2023)   SDOH Interventions:     Readmission Risk Interventions     No data to display

## 2023-03-22 NOTE — ED Notes (Signed)
Pt refused some nighttime doses of medications as she says that she takes them in the mornings. Medications are ordered for morning doses and patient will take then.

## 2023-03-22 NOTE — Consult Note (Signed)
PODIATRY CONSULTATION  NAME [name] MRN [id number] DOB Nov 10, 1945 DOA 03/22/2023   Reason for consult:  Chief Complaint  Patient presents with   Fall    Pt seen here for fall x2 days ago. Per pt/EMS she has been sitting in the same spot for over 48hrs.    Attending/Consulting physician:  Dr. Jessica Priest, MD History of present illness: 77 y.o. female with past medical history significant for morbid obesity, chronic diastolic CHF, stage III CKD, hypertension, hypothyroidism, depression, cognitive impairment.  Patient had originally presented to the emergency room 2 days prior after a mechanical fall at home.  She was noted to sustain minimally displaced right distal fibula fracture, and left pretibial region hematoma. Splint was applied to the right lower extremity. She returned today via EMS as she was unable to ambulate and did not have assistance in the house.  Podiatry consulted for evaluation and management of the right distal fibula fracture.  Past Medical History:  Diagnosis Date   Benign positional vertigo 03/04/2016   Brain injury [facility])    CHF (congestive heart failure) (HCC)    Climacteric    Depression    DJD (degenerative joint disease)    lumbar and cervical   Dyslipidemia    GERD (gastroesophageal reflux disease)    Hypertension    Hypokalemia    Hypothyroidism    IBS (irritable bowel syndrome)    OAB (overactive bladder)    Ovarian cyst 03/25/2015   3cm simple cyst on right ovary   Rhinitis    Sleep apnea    Spasm of thoracolumbar muscle        Latest Ref Rng & Units 03/22/2023   11:49 AM 02/18/2023    6:57 PM 10/29/2022    5:40 AM  CBC  WBC 4.0 - 10.5 K/uL 10.7  9.7  7.0   Hemoglobin 12.0 - 15.0 g/dL 36.6  44.0  34.7   Hematocrit 36.0 - 46.0 % 31.4  34.0  31.5   Platelets 150 - 400 K/uL 224  329  214        Latest Ref Rng & Units 03/22/2023   11:49 AM 02/18/2023    6:57 PM 11/02/2022    4:40 AM  BMP  Glucose 70 - 99 mg/dL 97   86  92   BUN 8 - 23 mg/dL 20  54  30   Creatinine 0.44 - 1.00 mg/dL 4.25  9.56  3.87   Sodium 135 - 145 mmol/L 138  134  136   Potassium 3.5 - 5.1 mmol/L 4.4  4.5  3.6   Chloride 98 - 111 mmol/L 108  101  108   CO2 22 - 32 mmol/L 22  21  21    Calcium 8.9 - 10.3 mg/dL 9.1  56.4  33.2       Physical Exam: Lower Extremity Exam Vasc: R - PT palpable, DP palpable. Cap refill < 3 sec to digits  L - PT palpable, DP palpable. Cap refill <3 sec to digits  Hematoma appreciated to left pretibial region. Some ecchymosis to right lateral ankle without significant associated edema.  Derm: R - Normal temp/texture/turgor with no open lesion or clinical signs of infection.  L - Normal temp/texture/turgor with no open lesion or clinical signs of infection, Localized edema to pretibial region at site of hematoma  MSK:  No significant gross clinical deformity. Tenderness on palpation to right lateral ankle. Range of motion and muscle strength testing deffered.  Neuro: R -  Gross sensation intact. Gross motor function intact   L - Gross sensation intact. Gross motor function intact  Imaging: I independently reviewed 3 radiographic views of the right ankle from 03/22/2023. Oblique fracture of distal fibula present with minimal displacement. There is minimal widening of the medial ankle gutter. Diffuse soft tissue edema noted.  ASSESSMENT/PLAN OF CARE 77 y.o. female with PMHx significant for  morbid obesity, chronic diastolic CHF, stage III CKD, hypertension, hypothyroidism with minimally displaced, closed right distal fibula fracture. Left pretibial hematoma.  - Fiberglass cast applied to the right lower extremity. Do feel that given the patient's over all general health and the nature of the fibula fracture that this can be managed conservatively - Anticoagulation: per primary team - WB status: nonweightbearing to the right lower extremity. Recommend evaluation and treatment with PT OT. - Pain control per  primary team - Continue to elevate the extremity when at rest. May apply ice behind right knee for 15 minutes on, 15 minutes off. -Ace wrap applied to the left leg hematoma. - Patient can follow up in our office in 1-2 weeks. Podiatry will sign off.    Thank you for the consult.  Please contact me directly with any questions or concerns.           Bronwen Betters, DPM [facility]    [address] [place], [place] [address]                Office [phone]  Fax [phone]

## 2023-03-22 NOTE — ED Notes (Signed)
Called dietary for meal tray

## 2023-03-23 ENCOUNTER — Encounter: Payer: Self-pay | Admitting: Internal Medicine

## 2023-03-23 DIAGNOSIS — Z5971 Insufficient health insurance coverage: Secondary | ICD-10-CM | POA: Diagnosis not present

## 2023-03-23 DIAGNOSIS — S82392D Other fracture of lower end of left tibia, subsequent encounter for closed fracture with routine healing: Secondary | ICD-10-CM | POA: Diagnosis not present

## 2023-03-23 DIAGNOSIS — Z23 Encounter for immunization: Secondary | ICD-10-CM | POA: Diagnosis present

## 2023-03-23 DIAGNOSIS — S9032XA Contusion of left foot, initial encounter: Secondary | ICD-10-CM | POA: Diagnosis present

## 2023-03-23 DIAGNOSIS — S82831D Other fracture of upper and lower end of right fibula, subsequent encounter for closed fracture with routine healing: Secondary | ICD-10-CM

## 2023-03-23 DIAGNOSIS — S82831A Other fracture of upper and lower end of right fibula, initial encounter for closed fracture: Secondary | ICD-10-CM | POA: Diagnosis present

## 2023-03-23 DIAGNOSIS — M25572 Pain in left ankle and joints of left foot: Secondary | ICD-10-CM | POA: Diagnosis present

## 2023-03-23 DIAGNOSIS — G4730 Sleep apnea, unspecified: Secondary | ICD-10-CM | POA: Diagnosis present

## 2023-03-23 DIAGNOSIS — Z90710 Acquired absence of both cervix and uterus: Secondary | ICD-10-CM | POA: Diagnosis not present

## 2023-03-23 DIAGNOSIS — Y92009 Unspecified place in unspecified non-institutional (private) residence as the place of occurrence of the external cause: Secondary | ICD-10-CM | POA: Diagnosis not present

## 2023-03-23 DIAGNOSIS — Z79890 Hormone replacement therapy (postmenopausal): Secondary | ICD-10-CM | POA: Diagnosis not present

## 2023-03-23 DIAGNOSIS — M1990 Unspecified osteoarthritis, unspecified site: Secondary | ICD-10-CM | POA: Diagnosis present

## 2023-03-23 DIAGNOSIS — K589 Irritable bowel syndrome without diarrhea: Secondary | ICD-10-CM | POA: Diagnosis present

## 2023-03-23 DIAGNOSIS — W19XXXA Unspecified fall, initial encounter: Secondary | ICD-10-CM | POA: Diagnosis present

## 2023-03-23 DIAGNOSIS — E039 Hypothyroidism, unspecified: Secondary | ICD-10-CM | POA: Diagnosis present

## 2023-03-23 DIAGNOSIS — F32A Depression, unspecified: Secondary | ICD-10-CM | POA: Diagnosis present

## 2023-03-23 DIAGNOSIS — K219 Gastro-esophageal reflux disease without esophagitis: Secondary | ICD-10-CM | POA: Diagnosis present

## 2023-03-23 DIAGNOSIS — S82821D Torus fracture of lower end of right fibula, subsequent encounter for fracture with routine healing: Secondary | ICD-10-CM | POA: Diagnosis not present

## 2023-03-23 DIAGNOSIS — Z885 Allergy status to narcotic agent status: Secondary | ICD-10-CM | POA: Diagnosis not present

## 2023-03-23 DIAGNOSIS — N3946 Mixed incontinence: Secondary | ICD-10-CM | POA: Diagnosis present

## 2023-03-23 DIAGNOSIS — I5032 Chronic diastolic (congestive) heart failure: Secondary | ICD-10-CM | POA: Diagnosis present

## 2023-03-23 DIAGNOSIS — I130 Hypertensive heart and chronic kidney disease with heart failure and stage 1 through stage 4 chronic kidney disease, or unspecified chronic kidney disease: Secondary | ICD-10-CM | POA: Diagnosis present

## 2023-03-23 DIAGNOSIS — N390 Urinary tract infection, site not specified: Secondary | ICD-10-CM | POA: Diagnosis present

## 2023-03-23 DIAGNOSIS — S82409A Unspecified fracture of shaft of unspecified fibula, initial encounter for closed fracture: Secondary | ICD-10-CM | POA: Diagnosis present

## 2023-03-23 DIAGNOSIS — N3281 Overactive bladder: Secondary | ICD-10-CM | POA: Diagnosis present

## 2023-03-23 DIAGNOSIS — F0394 Unspecified dementia, unspecified severity, with anxiety: Secondary | ICD-10-CM | POA: Diagnosis present

## 2023-03-23 DIAGNOSIS — E785 Hyperlipidemia, unspecified: Secondary | ICD-10-CM | POA: Diagnosis present

## 2023-03-23 DIAGNOSIS — S8012XA Contusion of left lower leg, initial encounter: Secondary | ICD-10-CM | POA: Diagnosis present

## 2023-03-23 DIAGNOSIS — N1831 Chronic kidney disease, stage 3a: Secondary | ICD-10-CM | POA: Diagnosis present

## 2023-03-23 DIAGNOSIS — Z6841 Body Mass Index (BMI) 40.0 and over, adult: Secondary | ICD-10-CM | POA: Diagnosis not present

## 2023-03-23 LAB — BASIC METABOLIC PANEL
Anion gap: 5 (ref 5–15)
BUN: 18 mg/dL (ref 8–23)
CO2: 21 mmol/L — ABNORMAL LOW (ref 22–32)
Calcium: 9.2 mg/dL (ref 8.9–10.3)
Chloride: 110 mmol/L (ref 98–111)
Creatinine, Ser: 0.95 mg/dL (ref 0.44–1.00)
GFR, Estimated: >60 mL/min (ref >60)
Glucose, Bld: 100 mg/dL — ABNORMAL HIGH (ref 70–99)
Potassium: 4.4 mmol/L (ref 3.5–5.1)
Sodium: 136 mmol/L (ref 135–145)

## 2023-03-23 LAB — CBC
HCT: 30.5 % — ABNORMAL LOW (ref 36.0–46.0)
Hemoglobin: 10.2 g/dL — ABNORMAL LOW (ref 12.0–15.0)
MCH: 32.7 pg (ref 26.0–34.0)
MCHC: 33.4 g/dL (ref 30.0–36.0)
MCV: 97.8 fL (ref 80.0–100.0)
Platelets: 201 10*3/uL (ref 150–400)
RBC: 3.12 MIL/uL — ABNORMAL LOW (ref 3.87–5.11)
RDW: 12.2 % (ref 11.5–15.5)
WBC: 8.9 10*3/uL (ref 4.0–10.5)
nRBC: 0 % (ref 0.0–0.2)

## 2023-03-23 MED ORDER — LOPERAMIDE HCL 2 MG PO CAPS
4.0000 mg | ORAL_CAPSULE | Freq: Once | ORAL | Status: AC
  Administered 2023-03-23: 4 mg via ORAL
  Filled 2023-03-23: qty 2

## 2023-03-23 MED ORDER — TORSEMIDE 20 MG PO TABS
10.0000 mg | ORAL_TABLET | Freq: Every day | ORAL | Status: DC
  Administered 2023-03-23 – 2023-03-29 (×7): 10 mg via ORAL
  Filled 2023-03-23 (×6): qty 1

## 2023-03-23 MED ORDER — INFLUENZA VAC A&B SURF ANT ADJ 0.5 ML IM SUSY
0.5000 mL | PREFILLED_SYRINGE | INTRAMUSCULAR | Status: AC
  Administered 2023-03-24: 0.5 mL via INTRAMUSCULAR
  Filled 2023-03-23: qty 0.5

## 2023-03-23 MED ORDER — TORSEMIDE 20 MG PO TABS: 10.0000 mg | ORAL_TABLET | Freq: Every day | ORAL | Status: DC

## 2023-03-23 NOTE — Progress Notes (Signed)
PROGRESS NOTE     HPI was taken from Dr. Margette Fast: [name] is a 77 y.o. female with medical history significant of morbid obesity, chronic diastolic CHF, stage III CKD, hypertension, hypothyroidism, depression, and cognitive impairment.  Patient had presented to the emergency room 2 days prior after a mechanical fall at home on a level ground.  Post fall, patient was unable to get up by herself.  She called EMS and was brought into the emergency room after she was noted to have difficulty with ambulation.  She had complaints of left ankle joint pain and right knee joint pain.   Workup in the ED revealed minimally displaced right fibular fracture.  Patient's foot was placed in a splint and was subsequently discharged home with plans for outpatient follow-up with orthopedics.   As per patient, postdischarge, she has been unable to walk with the use of her rolling walker.  She has remained in her recliner.  She has no help at home hence has been peeing on herself.  She denies any chest pain, shortness of breath.  Denies any nausea or vomiting.  [name]  YQM:[id number] DOB: [birth year] DOA: 03/22/2023 PCP: Lauro Regulus, MD    Assessment & Plan:   Principal Problem:   Closed left tibial fracture Active Problems:   Closed fracture of distal end of right fibula with routine healing  Assessment and Plan: Subacute closed minimally displaced right distal fibula fracture: conitnue w/ fiberglass cast. Podiatry recs apprec     Generalized weakness:  PT/OT consulted    Chronic diastolic CHF: CHF appears compensated. Continue on home dose of aldactone, torsemide. Monitor I/Os   Morbid obesity: BMI of 40. Complicates overall care and prognosis    HTN: continue on coreg, amlodipine    Hypothyroidism: continue on home dose of synthroid    Anxiety: severity unknown. Continue on home dose of effexor    Dementia: continue on home dose of aricepht,  amantadine        DVT prophylaxis: heparin SQ Code Status: full  Family Communication: Disposition Plan: depends on PT/OT recs   Level of care: Telemetry Medical  Status is: Inpatient Remains inpatient appropriate because: severity of illness      Consultants:  Podiatry   Procedures:  Antimicrobials:  Subjective: Pt c/o leg pain   Objective: Vitals:   03/23/23 0600 03/23/23 0615 03/23/23 0630 03/23/23 0700  BP: (!) 149/72   (!) 151/67  Pulse: 87 81 82 91  Resp:    16  Temp:      TempSrc:      SpO2: 97% 98% 100% 96%  Weight:      Height:       No intake or output data in the 24 hours ending 03/23/23 0910 Filed Weights   03/22/23 1147  Weight: 112.9 kg    Examination:  General exam: Appears calm and comfortable  Respiratory system: Clear to auscultation. Respiratory effort normal. Cardiovascular system: S1 & S2 +. No rubs, gallops or clicks. Gastrointestinal system: Abdomen is obese, soft and nontender. Normal bowel sounds heard. Central nervous system: Alert and oriented. Moves all extremities Psychiatry: Judgement and insight appear normal. Mood & affect appropriate.     Data Reviewed: I have personally reviewed following labs and imaging studies  CBC: Recent Labs  Lab 03/22/23 1149 03/23/23 0400  WBC 10.7* 8.9  NEUTROABS 7.8*  --   HGB 10.6* 10.2*  HCT 31.4* 30.5*  MCV 96.9 97.8  PLT 224 201  Basic Metabolic Panel: Recent Labs  Lab 03/22/23 1149 03/23/23 0400  NA 138 136  K 4.4 4.4  CL 108 110  CO2 22 21*  GLUCOSE 97 100*  BUN 20 18  CREATININE 0.94 0.95  CALCIUM 9.1 9.2   GFR: Estimated Creatinine Clearance: 63.2 mL/min (by C-G formula based on SCr of 0.95 mg/dL). Liver Function Tests: No results for input(s): "AST", "ALT", "ALKPHOS", "BILITOT", "PROT", "ALBUMIN" in the last 168 hours. No results for input(s): "LIPASE", "AMYLASE" in the last 168 hours. No results for input(s): "AMMONIA" in the last 168 hours. Coagulation  Profile: No results for input(s): "INR", "PROTIME" in the last 168 hours. Cardiac Enzymes: Recent Labs  Lab 03/22/23 1149  CKTOTAL 39   BNP (last 3 results) No results for input(s): "PROBNP" in the last 8760 hours. HbA1C: No results for input(s): "HGBA1C" in the last 72 hours. CBG: No results for input(s): "GLUCAP" in the last 168 hours. Lipid Profile: No results for input(s): "CHOL", "HDL", "LDLCALC", "TRIG", "CHOLHDL", "LDLDIRECT" in the last 72 hours. Thyroid Function Tests: No results for input(s): "TSH", "T4TOTAL", "FREET4", "T3FREE", "THYROIDAB" in the last 72 hours. Anemia Panel: No results for input(s): "VITAMINB12", "FOLATE", "FERRITIN", "TIBC", "IRON", "RETICCTPCT" in the last 72 hours. Sepsis Labs: No results for input(s): "PROCALCITON", "LATICACIDVEN" in the last 168 hours.  No results found for this or any previous visit (from the past 240 hour(s)).       Radiology Studies: DG Ankle Complete Right  Result Date: 03/22/2023 CLINICAL DATA:  Distal fibula fracture, pain EXAM: RIGHT ANKLE - COMPLETE 3+ VIEW COMPARISON:  03/20/2023 FINDINGS: Unchanged appearance of a minimally displaced oblique fracture of the distal right fibula with mild widening of the ankle mortise status post cast application. Diffuse soft tissue edema about the ankle. No new fracture. IMPRESSION: Unchanged appearance of a minimally displaced oblique fracture of the distal right fibula with mild widening of the ankle mortise status post cast application. Diffuse soft tissue edema about the ankle. No new fracture. Electronically Signed   By: Jearld Lesch M.D.   On: 03/22/2023 15:28        Scheduled Meds:  amLODipine  5 mg Oral Daily   vitamin C  1,000 mg Oral Daily   carvedilol  50 mg Oral BID WC   cholecalciferol  2,000 Units Oral Daily   donepezil  10 mg Oral Daily   fluticasone furoate-vilanterol  1 puff Inhalation Daily   folic acid  500 mcg Oral Daily   heparin  5,000 Units Subcutaneous  Q8H   [START ON 03/24/2023] influenza vaccine adjuvanted  0.5 mL Intramuscular Tomorrow-1000   levothyroxine  112 mcg Oral Q0600   losartan  100 mg Oral Daily   melatonin  5 mg Oral QHS   montelukast  10 mg Oral QPM   pantoprazole  40 mg Oral Daily   spironolactone  25 mg Oral Daily   topiramate  25 mg Oral Daily   torsemide  20 mg Oral Daily   traZODone  50 mg Oral QHS   venlafaxine XR  150 mg Oral BID   Continuous Infusions:   LOS: 0 days      Charise Killian, MD [facility] Pager [phone]  If 7PM-7AM, please contact night-coverage [email or web address]  03/23/2023, 9:10 AM

## 2023-03-23 NOTE — ED Notes (Signed)
Pt assisted onto bedpan, OT at bedside assisting pt to get off bed pan.

## 2023-03-23 NOTE — Evaluation (Signed)
Physical Therapy Evaluation Patient Details Name: [name] MRN: [id number] DOB: 1945-07-04 Today's Date: 03/23/2023  History of Present Illness  Chalia Morua is a 77yoF who comes to [facility] on 03/20/23 after a fall and collapse onto BR floor at home, DC back to home from ED same day. Pt comes back 03/22/23 with ongoing remarkable weakness, inability to walk. Per EMS pt down on floor for 48 hours. PMH: HTN, CHF, CKD, hypoTSH. Imaging revealing of Rt distal fibular fracture. Podiatry consulting on 10/7, recommends conservative management in splint, NWB.  Clinical Impression  Pt remains acutely weak per her report without any clear etiology, albeit she is able to demonstrate mobility to EOB without any physical assistance. Rise to standing is made difficult by RLE NWB status, but from an elevated surface height and modA she is able to come to standing twice for ~10 seconds. Pt has known shoulder OA, hence ability to use arms for slide board to RW transfers in future likely to be limited. It is unlikely that she will tolerate much AMB of significance while she is NWB. Significantly inadequate support at home at this time. WIll need extensive rehabilitations to return to a level of function appropriate for living alone and providing care for spouse in future. Will continue to follow.         If plan is discharge home, recommend the following: Two people to help with walking and/or transfers   Can travel by private vehicle   No    Equipment Recommendations None recommended by PT  Recommendations for Other Services       Functional Status Assessment Patient has had a recent decline in their functional status and demonstrates the ability to make significant improvements in function in a reasonable and predictable amount of time.     Precautions / Restrictions Precautions Precautions: Fall Restrictions Weight Bearing Restrictions: Yes RLE Weight Bearing: Non weight bearing       Mobility  Bed Mobility Overal bed mobility: Needs Assistance Bed Mobility: Supine to Sit     Supine to sit: Supervision          Transfers Overall transfer level: Needs assistance Equipment used: None Transfers: Sit to/from Stand Sit to Stand: Mod assist, Via lift equipment           General transfer comment: dependent hug transfer twice, pt maintains RLE NWB    Ambulation/Gait                  Stairs            Wheelchair Mobility     Tilt Bed    Modified Rankin (Stroke Patients Only)       Balance                                             Pertinent Vitals/Pain Pain Assessment Pain Assessment: 0-10 Pain Score: 8  Pain Location: Left calf hematoma Pain Intervention(s): Limited activity within patient's tolerance, Monitored during session, Premedicated before session    Home Living Family/patient expects to be discharged to:: Private residence Living Arrangements: Alone;Spouse/significant other (Husband is currently in rehab unit) Available Help at Discharge:  (Friend able to check-in occasionally)   Home Access: Stairs to enter Entrance Stairs-Rails: Left;Right;Can reach both Entrance Stairs-Number of Steps: 5 stairs, has a stairlift in garage   Home Layout: One level Home Equipment:  Rolling Walker (2 wheels);Rollator (4 wheels);Hand held shower head;Grab bars - tub/shower;Shower seat;Adaptive equipment;Cane - single point;Lift chair (Amb Rollator to get to bathroom then cane within bathroom) Additional Comments: Newly Retired, sleeps in lift chair nightly    Prior Function Prior Level of Function : Needs assist;Driving;History of Falls (last six months)       Physical Assist : ADLs (physical)   ADLs (physical): Dressing;IADLs Mobility Comments: Pt is caregiver for husband, uses rollator in and out of home ADLs Comments: Reports trouble with UB dressing due to shoulders and weakness in arms. Unable to stand  for long time due to back, very little cooking and no cleaning. Insta cart for grocery.     Extremity/Trunk Assessment                Communication   Communication Communication: No apparent difficulties Cueing Techniques: Verbal cues;Gestural cues  Cognition Arousal: Alert Behavior During Therapy: WFL for tasks assessed/performed Overall Cognitive Status: Within Functional Limits for tasks assessed                                          General Comments      Exercises     Assessment/Plan    PT Assessment Patient needs continued PT services  PT Problem List Decreased strength;Decreased activity tolerance;Decreased balance;Decreased mobility;Decreased safety awareness       PT Treatment Interventions DME instruction;Patient/family education;Gait training;Functional mobility training;Therapeutic activities;Therapeutic exercise;Balance training;Neuromuscular re-education    PT Goals (Current goals can be found in the Care Plan section)  Acute Rehab PT Goals Patient Stated Goal: regain strength PT Goal Formulation: With patient Time For Goal Achievement: 04/06/23 Potential to Achieve Goals: Fair    Frequency Min 1X/week     Co-evaluation               AM-PAC PT "6 Clicks" Mobility  Outcome Measure Help needed turning from your back to your side while in a flat bed without using bedrails?: A Little Help needed moving from lying on your back to sitting on the side of a flat bed without using bedrails?: A Little Help needed moving to and from a bed to a chair (including a wheelchair)?: Total Help needed standing up from a chair using your arms (e.g., wheelchair or bedside chair)?: Total Help needed to walk in hospital room?: Total Help needed climbing 3-5 steps with a railing? : Total 6 Click Score: 10    End of Session   Activity Tolerance: Patient tolerated treatment well;No increased pain;Patient limited by fatigue Patient left: in  bed;with call bell/phone within reach   PT Visit Diagnosis: Difficulty in walking, not elsewhere classified (R26.2);Other abnormalities of gait and mobility (R26.89);Muscle weakness (generalized) (M62.81)    Time: 4098-1191 PT Time Calculation (min) (ACUTE ONLY): 14 min   Charges:   PT Evaluation $PT Eval Moderate Complexity: 1 Mod   PT General Charges $$ ACUTE PT VISIT: 1 Visit        10:42 AM, 03/23/23 Rosamaria Lints, PT, DPT Physical Therapist - [facility]  [phone] (ASCOM) \  Anasia Agro C 03/23/2023, 10:39 AM

## 2023-03-23 NOTE — Evaluation (Addendum)
Occupational Therapy Evaluation Patient Details Name: [name] MRN: [id number] DOB: 08-28-1945 Today's Date: 03/23/2023   History of Present Illness [name] is a 77yoF who comes to [facility] on 03/20/23 after a fall and collapse onto BR floor at home, DC back to home from ED same day. Pt comes back 03/22/23 with ongoing remarkable weakness, inability to walk. Per EMS pt down on floor for 48 hours. PMH: HTN, CHF, CKD, hypoTSH. Imaging revealing of Rt distal fibular fracture. Podiatry consulting on 10/7, recommends conservative management in splint, NWB.   Clinical Impression   Pt was seen for OT evaluation this date, alert and oriented x4. Pt using bed pan and nurse present when arrived in room. Prior to hospital admission, Pt was MOD I, needing assistance with UB dressing due to decreased shoulder ROM. Pt lives in single level home with her husband who she is primary caregiver for. However, husband is currently in rehab facility. Pt states her ultimate goal is to be with husband in a setting with more assistance. Pt presents to acute OT demonstrating impaired ADL performance and functional mobility (See OT problem list for additional functional deficits).  Supine>sit with MIN A, sit>supine with MOD A for management of BLE. Attempted STS x3 using RW with MAX +2 assistance from elevated bed. Unable to reach full stand on this date. Pt able to roll to R and L with CGA + use of bed rails for post BM for peri care. MAX A for peri care required.  Re educated Pt on NWB precautions during mobility. Pt would benefit from skilled OT services to address noted impairments and functional limitations (see below for any additional details) in order to maximize safety and independence while minimizing falls risk and caregiver burden. OT will follow acutely.    If plan is discharge home, recommend the following: A lot of help with bathing/dressing/bathroom;A lot of help with walking and/or  transfers;Two people to help with walking and/or transfers;Assist for transportation;Assistance with cooking/housework    Functional Status Assessment  Patient has had a recent decline in their functional status and demonstrates the ability to make significant improvements in function in a reasonable and predictable amount of time.  Equipment Recommendations  Other (comment) (defer to next venue of care)    Recommendations for Other Services       Precautions / Restrictions Precautions Precautions: Fall Restrictions Weight Bearing Restrictions: Yes RLE Weight Bearing: Non weight bearing      Mobility Bed Mobility Overal bed mobility: Needs Assistance Bed Mobility: Supine to Sit, Sit to Supine     Supine to sit: Min assist, HOB elevated Sit to supine: Mod assist, HOB elevated   General bed mobility comments: for managment of BLE    Transfers Overall transfer level: Needs assistance Equipment used: Rolling walker (2 wheels) Transfers: Sit to/from Stand Sit to Stand: Max assist, +2 physical assistance, From elevated surface           General transfer comment: Attempted x3 STS unable to fully stand - MAX A +2      Balance Overall balance assessment: Needs assistance Sitting-balance support: Feet supported Sitting balance-Leahy Scale: Good     Standing balance support: Bilateral upper extremity supported, During functional activity, Reliant on assistive device for balance Standing balance-Leahy Scale: Zero                             ADL either performed or assessed with clinical  judgement   ADL Overall ADL's : Needs assistance/impaired Eating/Feeding: Supervision/ safety Eating/Feeding Details (indicate cue type and reason): Reports no difficulities   Grooming Details (indicate cue type and reason): Reported completed grooming tasks in bed prior to OT eval                     Toileting- Clothing Manipulation and Hygiene: MAX A ;Bed  level     Tub/Shower Transfer Details (indicate cue type and reason): Bed pan in use when arrival to room Functional mobility during ADLs: Maximal assistance;+2 for physical assistance;Rolling walker (2 wheels)       Vision Baseline Vision/History: 0 No visual deficits       Perception Perception: Within Functional Limits       Praxis Praxis: WFL       Pertinent Vitals/Pain Pain Assessment Pain Assessment: Faces Faces Pain Scale: Hurts little more Pain Location: left calf, very painfull bruse Pain Descriptors / Indicators: Moaning, Grimacing, Guarding, Tender Pain Intervention(s): Limited activity within patient's tolerance, Monitored during session, Repositioned, Relaxation     Extremity/Trunk Assessment Upper Extremity Assessment Upper Extremity Assessment: RUE deficits/detail;LUE deficits/detail RUE Deficits / Details: AROM shoulder flexion to approx 80 degrees, pt reports arthritic shoulder RUE: Shoulder pain with ROM LUE Deficits / Details: AROM shoulder flexion to approx 70 degrees, pt reports arthritic shoulder LUE: Shoulder pain with ROM   Lower Extremity Assessment Lower Extremity Assessment: RLE deficits/detail;LLE deficits/detail RLE Deficits / Details: NWB due to Closed L tibial fracture LLE Deficits / Details: bruise on calf, very painful to the touch       Communication Communication Communication: No apparent difficulties Cueing Techniques: Verbal cues;Gestural cues   Cognition Arousal: Alert Behavior During Therapy: WFL for tasks assessed/performed Overall Cognitive Status: Within Functional Limits for tasks assessed                                 General Comments: AO throughout OT session     General Comments  cast checked pre/post session, sharp edges around casting, repositioned sock to protect skin    Exercises Other Exercises Other Exercises: Edu: role of OT, t/f techniques + body position, WB precautions   Shoulder  Instructions      Home Living                                          Prior Functioning/Environment                          OT Problem List: Decreased strength;Decreased range of motion;Decreased activity tolerance;Decreased knowledge of precautions;Pain;Impaired balance (sitting and/or standing)      OT Treatment/Interventions: Self-care/ADL training;Therapeutic activities;Energy conservation;DME and/or AE instruction    OT Goals(Current goals can be found in the care plan section) Acute Rehab OT Goals Patient Stated Goal: To go to rehab with husband OT Goal Formulation: With patient Time For Goal Achievement: 04/06/23 Potential to Achieve Goals: Good ADL Goals Pt Will Perform Grooming: sitting;with modified independence Pt Will Perform Lower Body Dressing: sitting/lateral leans;with mod assist Pt Will Transfer to Toilet: with mod assist;bedside commode;squat pivot transfer Pt Will Perform Toileting - Clothing Manipulation and hygiene: with mod assist;sitting/lateral leans  OT Frequency: Min 1X/week    Co-evaluation  AM-PAC OT "6 Clicks" Daily Activity     Outcome Measure Help from another person eating meals?: None Help from another person taking care of personal grooming?: A Lot Help from another person toileting, which includes using toliet, bedpan, or urinal?: Total Help from another person bathing (including washing, rinsing, drying)?: A Lot Help from another person to put on and taking off regular upper body clothing?: A Lot Help from another person to put on and taking off regular lower body clothing?: A Lot 6 Click Score: 13   End of Session Equipment Utilized During Treatment: Gait belt;Rolling walker (2 wheels) Nurse Communication: Mobility status  Activity Tolerance: Patient limited by pain;Patient limited by fatigue Patient left: in bed;with call bell/phone within reach;with bed alarm set  OT Visit Diagnosis:  Unsteadiness on feet (R26.81);Muscle weakness (generalized) (M62.81);History of falling (Z91.81);Other abnormalities of gait and mobility (R26.89)                Time: 5284-1324 OT Time Calculation (min): 30 min Charges:  OT General Charges $OT Visit: 1 Visit OT Evaluation $OT Eval Moderate Complexity: 1 Mod  [employer], OTS

## 2023-03-23 NOTE — Plan of Care (Signed)
  Problem: Clinical Measurements: Goal: Ability to maintain clinical measurements within normal limits will improve Outcome: Progressing   Problem: Nutrition: Goal: Adequate nutrition will be maintained Outcome: Progressing   

## 2023-03-23 NOTE — ED Notes (Addendum)
Pt assisted to use bed pan. Pt repositioned and given remote. Denies other needs at this time

## 2023-03-24 DIAGNOSIS — S82831D Other fracture of upper and lower end of right fibula, subsequent encounter for closed fracture with routine healing: Secondary | ICD-10-CM | POA: Diagnosis not present

## 2023-03-24 LAB — CBC
HCT: 31.6 % — ABNORMAL LOW (ref 36.0–46.0)
Hemoglobin: 10.7 g/dL — ABNORMAL LOW (ref 12.0–15.0)
MCH: 32.7 pg (ref 26.0–34.0)
MCHC: 33.9 g/dL (ref 30.0–36.0)
MCV: 96.6 fL (ref 80.0–100.0)
Platelets: 201 10*3/uL (ref 150–400)
RBC: 3.27 MIL/uL — ABNORMAL LOW (ref 3.87–5.11)
RDW: 12 % (ref 11.5–15.5)
WBC: 7.4 10*3/uL (ref 4.0–10.5)
nRBC: 0 % (ref 0.0–0.2)

## 2023-03-24 LAB — BASIC METABOLIC PANEL
Anion gap: 9 (ref 5–15)
BUN: 23 mg/dL (ref 8–23)
CO2: 22 mmol/L (ref 22–32)
Calcium: 9.3 mg/dL (ref 8.9–10.3)
Chloride: 104 mmol/L (ref 98–111)
Creatinine, Ser: 1.04 mg/dL — ABNORMAL HIGH (ref 0.44–1.00)
GFR, Estimated: 55 mL/min — ABNORMAL LOW (ref >60)
Glucose, Bld: 104 mg/dL — ABNORMAL HIGH (ref 70–99)
Potassium: 4 mmol/L (ref 3.5–5.1)
Sodium: 135 mmol/L (ref 135–145)

## 2023-03-24 MED ORDER — SODIUM CHLORIDE 0.9 % IV SOLN
1.0000 g | INTRAVENOUS | Status: AC
  Administered 2023-03-24 – 2023-03-26 (×3): 1 g via INTRAVENOUS
  Filled 2023-03-24 (×3): qty 10

## 2023-03-24 NOTE — TOC Progression Note (Signed)
Transition of Care [facility]) - Progression Note    Patient Details  Name: [name] MRN: [id number] Date of Birth: 1945/10/19  [facility] CM/SW Contact  [name], RN Phone Number: 03/24/2023, 1:24 PM  Clinical Narrative:    Met with the patient, she requested that she go to the same facility as her husband, [name] be able to accommodate her         Expected Discharge Plan and Services                                               Social Determinants of Health (SDOH) Interventions SDOH Screenings   Food Insecurity: No Food Insecurity (03/23/2023)  Housing: Low Risk  (03/23/2023)  Transportation Needs: No Transportation Needs (03/23/2023)  Utilities: Not At Risk (03/23/2023)  Depression (PHQ2-9): Low Risk  (11/11/2022)  Social Connections: Unknown (10/26/2021)   Received from [facility]  Tobacco Use: Low Risk  (03/23/2023)    Readmission Risk Interventions     No data to display

## 2023-03-24 NOTE — Progress Notes (Signed)
Occupational Therapy Treatment Patient Details Name: [name] MRN: [id number] DOB: 03-05-1946 Today's Date: 03/24/2023   History of present illness [name] is a 77yoF who comes to [facility] on 03/20/23 after a fall and collapse onto BR floor at home, DC back to home from ED same day. Pt comes back 03/22/23 with ongoing remarkable weakness, inability to walk. Per EMS pt down on floor for 48 hours. PMH: HTN, CHF, CKD, hypoTSH. Imaging revealing of Rt distal fibular fracture. Podiatry consulting on 10/7, recommends conservative management in splint, NWB.   OT comments  Chart reviewed prior to start of tx. Pt seen for OT treatment on this date. Upon arrival to room pt supine in bed, agreeable to tx. Pt was alert and orientated throughout session. Tx session targeted improved ADL activity tolerance. Pt able to get to EOB requiring MIN A for LE management + HOB elevated. Pt attempted stand pivot t/f to the recliner requiring RW + MAX A +2, unable to adhere to WB precations/tolerate standing; second STS attempt completed squat pivot t/f + MAX A +2. Overall mobility Pt requires MAX +2 physical assistance and continuous step by step multimodal cues when t/f. Pt making good progress toward goals, will continue to follow POC. Discharge recommendation remains appropriate. OT will follow acutely.       If plan is discharge home, recommend the following:  A lot of help with bathing/dressing/bathroom;A lot of help with walking and/or transfers;Two people to help with walking and/or transfers;Assist for transportation;Assistance with cooking/housework   Equipment Recommendations  Other (comment)    Recommendations for Other Services      Precautions / Restrictions Precautions Precautions: Fall Restrictions Weight Bearing Restrictions: Yes RLE Weight Bearing: Non weight bearing       Mobility Bed Mobility Overal bed mobility: Needs Assistance Bed Mobility: Supine to Sit      Supine to sit: Min assist, HOB elevated     General bed mobility comments: for managment of BLE    Transfers Overall transfer level: Needs assistance Equipment used: Rolling walker (2 wheels), None               General transfer comment: Attempted stand pivot t/f + RW + MAX A +2, unable to adhere to WB precations/tolerate standing; Second STS attempt completed squat pivot t/f + MAX A +2     Balance Overall balance assessment: Needs assistance Sitting-balance support: Feet supported Sitting balance-Leahy Scale: Good Sitting balance - Comments: Noted improved static sitting   Standing balance support: Bilateral upper extremity supported, During functional activity, Reliant on assistive device for balance Standing balance-Leahy Scale: Zero                             ADL either performed or assessed with clinical judgement   ADL Overall ADL's : Needs assistance/impaired       Grooming Details (indicate cue type and reason): Pt reported she painted her finger nails prior to session                 Toilet Transfer: Maximal assistance;+2 for physical assistance;Requires drop arm;Squat-pivot Toilet Transfer Details (indicate cue type and reason): Simulated toilet t/f; EOB<> recliner, requiring step by step multimodal cues to adhere to WB precautions         Functional mobility during ADLs: Maximal assistance;+2 for physical assistance      Extremity/Trunk Assessment  Vision       Perception     Praxis      Cognition Arousal: Alert Behavior During Therapy: WFL for tasks assessed/performed Overall Cognitive Status: Within Functional Limits for tasks assessed                                 General Comments: AO throughout OT session        Exercises Other Exercises Other Exercises: Edu:t/f techniques + body position, WB precautions    Shoulder Instructions       General Comments Cast check pre/post  session, sharp edges still remain around casting, repositioned sock to protect skin.    Pertinent Vitals/ Pain       Pain Assessment Pain Assessment: Faces Faces Pain Scale: Hurts a little bit Pain Location: Left calf, very painfull bruse Pain Descriptors / Indicators: Moaning, Grimacing, Guarding, Tender Pain Intervention(s): Monitored during session, Limited activity within patient's tolerance, Repositioned  Home Living                                          Prior Functioning/Environment              Frequency  Min 1X/week        Progress Toward Goals  OT Goals(current goals can now be found in the care plan section)  Progress towards OT goals: Progressing toward goals  Acute Rehab OT Goals Patient Stated Goal: Go to rehab with husband OT Goal Formulation: With patient Time For Goal Achievement: 04/06/23 Potential to Achieve Goals: Good  Plan      Co-evaluation                 AM-PAC OT "6 Clicks" Daily Activity     Outcome Measure   Help from another person eating meals?: None Help from another person taking care of personal grooming?: A Lot Help from another person toileting, which includes using toliet, bedpan, or urinal?: Total Help from another person bathing (including washing, rinsing, drying)?: A Lot Help from another person to put on and taking off regular upper body clothing?: A Lot Help from another person to put on and taking off regular lower body clothing?: A Lot 6 Click Score: 13    End of Session Equipment Utilized During Treatment: Gait belt;Rolling walker (2 wheels)  OT Visit Diagnosis: Unsteadiness on feet (R26.81);Muscle weakness (generalized) (M62.81);History of falling (Z91.81);Other abnormalities of gait and mobility (R26.89)   Activity Tolerance Patient limited by pain;Patient limited by fatigue   Patient Left with call bell/phone within reach;in chair   Nurse Communication Mobility status, sling left in  room, nurse and nurse tech notified via secure chat        Time: [phone] OT Time Calculation (min): 23 min  Charges: OT General Charges $OT Visit: 1 Visit OT Treatments $Therapeutic Activity: 23-37 mins   [employer], OTS

## 2023-03-24 NOTE — Plan of Care (Signed)
  Problem: Education: Goal: Knowledge of General Education information will improve Description: Including pain rating scale, medication(s)/side effects and non-pharmacologic comfort measures Outcome: Progressing   Problem: Health Behavior/Discharge Planning: Goal: Ability to manage health-related needs will improve Outcome: Progressing   Problem: Clinical Measurements: Goal: Will remain free from infection Outcome: Progressing   Problem: Nutrition: Goal: Adequate nutrition will be maintained Outcome: Progressing   Problem: Elimination: Goal: Will not experience complications related to bowel motility Outcome: Progressing   

## 2023-03-24 NOTE — NC FL2 (Signed)
[place] MEDICAID FL2 LEVEL OF CARE FORM     IDENTIFICATION  Patient Name: [name] Birthdate: 30-Jan-1946 Sex: female Admission Date (Current Location): 03/22/2023  [facility] and [place] Number:  Chiropodist and Address:  [facility], [address] [place], [place] [address]      Provider Number: [id number]  Attending Physician Name and Address:  Lurene Shadow, MD  Relative Name and Phone Number:       Current Level of Care:   Recommended Level of Care:   Prior Approval Number:    Date Approved/Denied:   PASRR Number: [id number] daughter Auria  Discharge Plan: SNF    Current Diagnoses: Patient Active Problem List   Diagnosis Date Noted   Closed fracture of distal end of right fibula with routine healing 03/23/2023   Fibula fracture 03/23/2023   Closed left tibial fracture 03/22/2023   Acute renal failure superimposed on stage 3b chronic kidney disease (HCC) 10/27/2022   Weakness 10/27/2022   Venous stasis 10/27/2022   Community acquired pneumonia 07/03/2020   OSA (obstructive sleep apnea) 07/03/2020   CAP (community acquired pneumonia) 07/03/2020   Sepsis (HCC) 07/02/2020   History of anesthesia reaction 02/20/2020   Swelling of limb 02/20/2020   Chronic diastolic CHF (congestive heart failure) (HCC) 08/22/2019   Edema 07/29/2019   Prediabetes 07/07/2019   Depression 04/26/2019   Disease of thyroid gland 04/26/2019   GERD (gastroesophageal reflux disease) 04/26/2019   History of head injury 04/26/2019   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/26/2019   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   Spasm of lumbar paraspinous muscle 02/14/2019   Chronic pain syndrome 02/14/2019   Use of cane as ambulatory aid 01/04/2019   Acneiform eruption 07/09/2017   Lumbar spondylosis 04/20/2017   Lumbar facet arthropathy 04/20/2017   Lumbar degenerative disc disease 04/20/2017   Anxiety 11/05/2016   Facet arthritis of lumbar  region 09/16/2016   Low back pain 07/25/2016   Morbid obesity with BMI of 40.0-44.9, adult (HCC) 06/03/2016   Acquired hypothyroidism 03/04/2016   Cognitive deficit as late effect of traumatic brain injury (HCC) 03/04/2016   Health care maintenance 03/04/2016   HTN, goal below 140/90 03/04/2016   Hypokalemia 03/04/2016   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   Localized primary osteoarthritis of lower leg, right 08/21/2015   Primary osteoarthritis of left knee 04/10/2015   Right knee pain 01/18/2013    Orientation RESPIRATION BLADDER Height & Weight     Self, Time, Situation, Place  Normal Continent Weight: 114 kg Height:  5\' 6"  (167.6 cm)  BEHAVIORAL SYMPTOMS/MOOD NEUROLOGICAL BOWEL NUTRITION STATUS      Continent Diet (see dc summary)  AMBULATORY STATUS COMMUNICATION OF NEEDS Skin   Limited Assist Verbally Normal, Surgical wounds                       Personal Care Assistance Level of Assistance  Bathing, Feeding, Dressing Bathing Assistance: Limited assistance Feeding assistance: Independent Dressing Assistance: Limited assistance     Functional Limitations Info  Sight, Hearing, Speech Sight Info: Adequate Hearing Info: Adequate Speech Info: Adequate    SPECIAL CARE FACTORS FREQUENCY  PT (By licensed PT), OT (By licensed OT)     PT Frequency: 5 times per week OT Frequency: 5 times per week            Contractures Contractures Info: Not present    Additional Factors Info  Code Status, Allergies Code Status Info: full code Allergies  Info: Codeine, Povidone Iodine, Hydromorphone, Latex, Oxycodone-acetaminophen, Sulfa Antibiotics, Bupropion, Gabapentin, Hydrocodone, Morphine And Codeine, Procaine           Current Medications (03/24/2023):  This is the current hospital active medication list Current Facility-Administered Medications  Medication Dose Route Frequency Provider Last Rate Last Admin   acetaminophen (TYLENOL) tablet 500 mg  500 mg  Oral Q6H PRN Acheampong, Genice Rouge, MD   500 mg at 03/24/23 0756   amLODipine (NORVASC) tablet 5 mg  5 mg Oral Daily Acheampong, Genice Rouge, MD   5 mg at 03/24/23 1610   ascorbic acid (VITAMIN C) tablet 1,000 mg  1,000 mg Oral Daily Lilia Pro, MD   1,000 mg at 03/24/23 0954   baclofen (LIORESAL) tablet 20 mg  20 mg Oral Daily PRN Acheampong, Genice Rouge, MD       carvedilol (COREG) tablet 50 mg  50 mg Oral BID WC Acheampong, Genice Rouge, MD   50 mg at 03/24/23 0756   cholecalciferol (VITAMIN D3) 25 MCG (1000 UNIT) tablet 2,000 Units  2,000 Units Oral Daily Acheampong, Genice Rouge, MD   2,000 Units at 03/24/23 0954   donepezil (ARICEPT) tablet 10 mg  10 mg Oral Daily Acheampong, Genice Rouge, MD   10 mg at 03/24/23 0756   folic acid (FOLVITE) tablet 0.5 mg  500 mcg Oral Daily Acheampong, Genice Rouge, MD   0.5 mg at 03/24/23 0958   heparin injection 5,000 Units  5,000 Units Subcutaneous Q8H Acheampong, Genice Rouge, MD   5,000 Units at 03/24/23 0602   levothyroxine (SYNTHROID) tablet 112 mcg  112 mcg Oral Q0600 Lilia Pro, MD   112 mcg at 03/24/23 0602   losartan (COZAAR) tablet 100 mg  100 mg Oral Daily Lilia Pro, MD   100 mg at 03/24/23 0955   melatonin tablet 5 mg  5 mg Oral QHS Lilia Pro, MD   5 mg at 03/23/23 2219   metoprolol tartrate (LOPRESSOR) injection 5 mg  5 mg Intravenous Q6H PRN Acheampong, Genice Rouge, MD       montelukast (SINGULAIR) tablet 10 mg  10 mg Oral QPM Acheampong, Genice Rouge, MD   10 mg at 03/23/23 1729   ondansetron (ZOFRAN) tablet 4 mg  4 mg Oral Q6H PRN Acheampong, Genice Rouge, MD       Or   ondansetron (ZOFRAN) injection 4 mg  4 mg Intravenous Q6H PRN Acheampong, Genice Rouge, MD       pantoprazole (PROTONIX) EC tablet 40 mg  40 mg Oral Daily Acheampong, Genice Rouge, MD   40 mg at 03/24/23 9604   senna-docusate (Senokot-S) tablet 1 tablet  1 tablet Oral QHS PRN Acheampong, Genice Rouge, MD       spironolactone (ALDACTONE) tablet 25 mg  25 mg Oral Daily Acheampong, Genice Rouge, MD   25 mg at  03/24/23 0955   topiramate (TOPAMAX) tablet 25 mg  25 mg Oral Daily Acheampong, Genice Rouge, MD   25 mg at 03/24/23 0954   torsemide (DEMADEX) tablet 10 mg  10 mg Oral Daily Charise Killian, MD   10 mg at 03/24/23 0954   traZODone (DESYREL) tablet 50 mg  50 mg Oral QHS Lilia Pro, MD   50 mg at 03/23/23 2219   venlafaxine XR (EFFEXOR-XR) 24 hr capsule 150 mg  150 mg Oral BID Lilia Pro, MD   150 mg at 03/24/23 5409     Discharge Medications: Please see discharge summary for a  list of discharge medications.  Relevant Imaging Results:  Relevant Lab Results:   Additional Information SS#: [id number]  [name], RN

## 2023-03-24 NOTE — Progress Notes (Addendum)
Progress Note    [name]  ZOX:[id number] DOB: 1946-06-08  DOA: 03/22/2023 PCP: Lauro Regulus, MD      Brief Narrative:    Medical records reviewed and are as summarized below:  [name] is a 77 y.o. female with medical history significant of morbid obesity, chronic diastolic CHF, stage III CKD, hypertension, hypothyroidism, depression, and cognitive impairment.  She initially presented to the ED on 03/20/2023 after mechanical fall at home on level ground.  She had complaints of left ankle joint pain and right knee joint pain.  She was found to have minimally displaced right fibula fracture.  Her right foot was placed in the splint and she was discharged home with plans for outpatient follow-up with orthopedic surgeon.   However, she did not do well at home.  She has been unable to walk using her rolling walker.  She has not had any help at home and remained in her recliner.  She says she was peeing on herself because of inability to ambulate.  She presented to the ED again on 03/22/2023.       Assessment/Plan:   Principal Problem:   Closed fracture of distal end of right fibula with routine healing Active Problems:   Fibula fracture    Body mass index is 40.56 kg/m.  (Morbid obesity).  This complicates overall care and prognosis   Subacute closed minimally displaced right distal fibula fracture: Patient reported that edges of the cast was digging into her skin.  Dr. Jamse Arn, podiatrist, was contacted.  He will see patient later today.     Acute UTI: Urinalysis from 03/22/2023 showed moderate leukocytes, positive nitrite, rare bacteria and wbc 21-50. I called the lab and requested  culture of previous urine sample. Start IV Ceftriaxone.   Generalized weakness: PT and OT recommended discharge to SNF.    Chronic diastolic CHF: CHF appears compensated. Continue on home dose of aldactone, torsemide. Monitor I/Os    Other  comorbidities include hypertension, hypothyroidism, anxiety, dementia   Diet Order             Diet Heart Room service appropriate? Yes; Fluid consistency: Thin  Diet effective now                            Consultants: Podiatrist  Procedures: None    Medications:    amLODipine  5 mg Oral Daily   vitamin C  1,000 mg Oral Daily   carvedilol  50 mg Oral BID WC   cholecalciferol  2,000 Units Oral Daily   donepezil  10 mg Oral Daily   folic acid  500 mcg Oral Daily   heparin  5,000 Units Subcutaneous Q8H   levothyroxine  112 mcg Oral Q0600   losartan  100 mg Oral Daily   melatonin  5 mg Oral QHS   montelukast  10 mg Oral QPM   pantoprazole  40 mg Oral Daily   spironolactone  25 mg Oral Daily   topiramate  25 mg Oral Daily   torsemide  10 mg Oral Daily   traZODone  50 mg Oral QHS   venlafaxine XR  150 mg Oral BID   Continuous Infusions:   Anti-infectives (From admission, onward)    None              Family Communication/Anticipated D/C date and plan/Code Status   DVT prophylaxis: heparin injection 5,000 Units Start: 03/22/23  2200     Code Status: Full Code  Family Communication: None Disposition Plan: Plan to discharge to SNF   Status is: Inpatient Remains inpatient appropriate because: Awaiting placement to SNF       Subjective:   Interval events noted.  She complains of pain in the right foot and right leg. She complained of urge incontinence, urgency and increased urinary frequency.  Objective:    Vitals:   03/24/23 0500 03/24/23 0745 03/24/23 0958 03/24/23 1522  BP:  (!) 157/126 133/63 132/67  Pulse:  76 66 69  Resp:  16  16  Temp:  98.1 F (36.7 C)  98.1 F (36.7 C)  TempSrc:      SpO2:  100%  100%  Weight: 114 kg     Height:       No data found.  No intake or output data in the 24 hours ending 03/24/23 1602 Filed Weights   03/22/23 1147 03/24/23 0500  Weight: 112.9 kg 114 kg    Exam:  GEN:  NAD SKIN: Warm and dry EYES: No pallor or icterus ENT: MMM CV: RRR PULM: CTA B ABD: soft, obese, NT, +BS CNS: AAO x 3, non focal EXT: Cast on right foot and right leg       Data Reviewed:   I have personally reviewed following labs and imaging studies:  Labs: Labs show the following:   Basic Metabolic Panel: Recent Labs  Lab 03/22/23 1149 03/23/23 0400 03/24/23 0644  NA 138 136 135  K 4.4 4.4 4.0  CL 108 110 104  CO2 22 21* 22  GLUCOSE 97 100* 104*  BUN 20 18 23   CREATININE 0.94 0.95 1.04*  CALCIUM 9.1 9.2 9.3   GFR Estimated Creatinine Clearance: 58.1 mL/min (A) (by C-G formula based on SCr of 1.04 mg/dL (H)). Liver Function Tests: No results for input(s): "AST", "ALT", "ALKPHOS", "BILITOT", "PROT", "ALBUMIN" in the last 168 hours. No results for input(s): "LIPASE", "AMYLASE" in the last 168 hours. No results for input(s): "AMMONIA" in the last 168 hours. Coagulation profile No results for input(s): "INR", "PROTIME" in the last 168 hours.  CBC: Recent Labs  Lab 03/22/23 1149 03/23/23 0400 03/24/23 0644  WBC 10.7* 8.9 7.4  NEUTROABS 7.8*  --   --   HGB 10.6* 10.2* 10.7*  HCT 31.4* 30.5* 31.6*  MCV 96.9 97.8 96.6  PLT 224 201 201   Cardiac Enzymes: Recent Labs  Lab 03/22/23 1149  CKTOTAL 39   BNP (last 3 results) No results for input(s): "PROBNP" in the last 8760 hours. CBG: No results for input(s): "GLUCAP" in the last 168 hours. D-Dimer: No results for input(s): "DDIMER" in the last 72 hours. Hgb A1c: No results for input(s): "HGBA1C" in the last 72 hours. Lipid Profile: No results for input(s): "CHOL", "HDL", "LDLCALC", "TRIG", "CHOLHDL", "LDLDIRECT" in the last 72 hours. Thyroid function studies: No results for input(s): "TSH", "T4TOTAL", "T3FREE", "THYROIDAB" in the last 72 hours.  Invalid input(s): "FREET3" Anemia work up: No results for input(s): "VITAMINB12", "FOLATE", "FERRITIN", "TIBC", "IRON", "RETICCTPCT" in the last 72  hours. Sepsis Labs: Recent Labs  Lab 03/22/23 1149 03/23/23 0400 03/24/23 0644  WBC 10.7* 8.9 7.4    Microbiology No results found for this or any previous visit (from the past 240 hour(s)).  Procedures and diagnostic studies:  No results found.             LOS: 1 day   Jakeob Tullis  [facility]   Pager on  [email or web address] If 7PM-7AM, please contact night-coverage at [email or web address]     03/24/2023, 4:02 PM

## 2023-03-24 NOTE — Plan of Care (Signed)

## 2023-03-24 NOTE — Progress Notes (Signed)
Physical Therapy Treatment Patient Details Name: [name] MRN: [id number] DOB: August 20, 1945 Today's Date: 03/24/2023   History of Present Illness [name] is a 77yoF who comes to [facility] on 03/20/23 after a fall and collapse onto BR floor at home, DC back to home from ED same day. Pt comes back 03/22/23 with ongoing remarkable weakness, inability to walk. Per EMS pt down on floor for 48 hours. PMH: HTN, CHF, CKD, hypoTSH. Imaging revealing of Rt distal fibular fracture. Podiatry consulting on 10/7, recommends conservative management in splint, NWB.    PT Comments  Pt was pleasant and motivated to participate during the session and put forth good effort throughout. Pt found sitting in recliner and agreeable to below therex.  Pt put forth good effort with therex and tolerated manual resistance with several exercises per below with SpO2 and HR WNL on room air.  Session terminated upon arrival of dinner with transfer attempts deferred.  Pt will benefit from continued PT services upon discharge to safely address deficits listed in patient problem list for decreased caregiver assistance and eventual return to PLOF.      If plan is discharge home, recommend the following: Two people to help with walking and/or transfers;Two people to help with bathing/dressing/bathroom;Assistance with cooking/housework;Assist for transportation;Help with stairs or ramp for entrance   Can travel by private vehicle     No  Equipment Recommendations  None recommended by PT    Recommendations for Other Services       Precautions / Restrictions Precautions Precautions: Fall Restrictions Weight Bearing Restrictions: Yes RLE Weight Bearing: Non weight bearing     Mobility  Bed Mobility               General bed mobility comments: Therex only this session    Transfers                        Ambulation/Gait                   Stairs             Wheelchair  Mobility     Tilt Bed    Modified Rankin (Stroke Patients Only)       Balance                                            Cognition Arousal: Alert Behavior During Therapy: WFL for tasks assessed/performed Overall Cognitive Status: Within Functional Limits for tasks assessed                                          Exercises Total Joint Exercises Ankle Circles/Pumps: Strengthening, Left, 5 reps, 10 reps (with manual resistance) Quad Sets: Strengthening, Both, 5 reps, 10 reps Gluteal Sets: Strengthening, Both, 10 reps Towel Squeeze: Strengthening, Both, 5 reps Hip ABduction/ADduction: AAROM, Strengthening, Both, 5 reps Straight Leg Raises: AAROM, Strengthening, Both, 5 reps Long Arc Quad: Strengthening, Both, 5 reps, 10 reps (with manual resistance) Knee Flexion: Strengthening, Both, 5 reps, 10 reps (with manual resistance) Marching in Standing: Strengthening, Both, 10 reps, Seated Other Exercises Other Exercises: Pt education on physiological benefits of exercise/activity Other Exercises: HEP education per handout provided    General Comments General comments (skin integrity,  edema, etc.): Cast check pre/post session, sharp edges still remain around casting, repositioned sock to protect skin.      Pertinent Vitals/Pain Pain Assessment Pain Assessment: 0-10 Pain Score: 6  Pain Location: Left lower leg Pain Descriptors / Indicators: Sore, Aching Pain Intervention(s): Premedicated before session, Monitored during session, Repositioned    Home Living                          Prior Function            PT Goals (current goals can now be found in the care plan section) Progress towards PT goals: Not progressing toward goals - comment (limited by functional weakness/NWB status)    Frequency    Min 1X/week      PT Plan      Co-evaluation              AM-PAC PT "6 Clicks" Mobility   Outcome Measure  Help  needed turning from your back to your side while in a flat bed without using bedrails?: A Little Help needed moving from lying on your back to sitting on the side of a flat bed without using bedrails?: A Little Help needed moving to and from a bed to a chair (including a wheelchair)?: Total Help needed standing up from a chair using your arms (e.g., wheelchair or bedside chair)?: Total Help needed to walk in hospital room?: Total Help needed climbing 3-5 steps with a railing? : Total 6 Click Score: 10    End of Session   Activity Tolerance: Patient tolerated treatment well Patient left: in chair;with call bell/phone within reach Nurse Communication: Mobility status;Weight bearing status PT Visit Diagnosis: Difficulty in walking, not elsewhere classified (R26.2);Other abnormalities of gait and mobility (R26.89);Muscle weakness (generalized) (M62.81)     Time: 1637-1700 PT Time Calculation (min) (ACUTE ONLY): 23 min  Charges:    $Therapeutic Exercise: 23-37 mins PT General Charges $$ ACUTE PT VISIT: 1 Visit                     D. Elly Modena PT, DPT 03/24/23, 5:19 PM

## 2023-03-25 DIAGNOSIS — S82831D Other fracture of upper and lower end of right fibula, subsequent encounter for closed fracture with routine healing: Secondary | ICD-10-CM | POA: Diagnosis not present

## 2023-03-25 NOTE — Progress Notes (Addendum)
Order placed for inpatient Ortho to proceed with cast change as it continues to bother the patient.  Negative calf pain.  Get.  Negative Homans' sign.  Motor or sensory functions are intact.  Patient is okay to be discharged from our standpoint after the cast has been changed.  Nicholes Rough D.P.M.

## 2023-03-25 NOTE — TOC Progression Note (Signed)
Transition of Care [facility]) - Progression Note    Patient Details  Name: [name] MRN: [id number] Date of Birth: 1946/05/08  Transition of Care [facility]) CM/SW Contact  [name], RN Phone Number: 03/25/2023, 8:34 AM  Clinical Narrative:     Received a message from [place] that the patient is out of bed days for rehab, she does not qualify for Medicaid and has applied 3 times Wynonia Lawman with [employer] is working on the case and is aware of the situation and is attempting to figure out something so that she can go to rehab, She will let me know once she is able to gather any additional information       Expected Discharge Plan and Services                                               Social Determinants of Health (SDOH) Interventions SDOH Screenings   Food Insecurity: No Food Insecurity (03/23/2023)  Housing: Low Risk  (03/23/2023)  Transportation Needs: No Transportation Needs (03/23/2023)  Utilities: Not At Risk (03/23/2023)  Depression (PHQ2-9): Low Risk  (11/11/2022)  Social Connections: Unknown (10/26/2021)   Received from [facility]  Tobacco Use: Low Risk  (03/23/2023)    Readmission Risk Interventions     No data to display

## 2023-03-25 NOTE — Progress Notes (Signed)
  I have reviewed and concur with this student's documentation.   Allyn Kenner , BSN, RN

## 2023-03-25 NOTE — Progress Notes (Signed)
Progress Note    [name]  NWG:[id number] DOB: 29-Aug-1945  DOA: 03/22/2023 PCP: Lauro Regulus, MD      Brief Narrative:    Medical records reviewed and are as summarized below:  [name] is a 77 y.o. female with medical history significant of morbid obesity, chronic diastolic CHF, stage III CKD, hypertension, hypothyroidism, depression, and cognitive impairment.  She initially presented to the ED on 03/20/2023 after mechanical fall at home on level ground.  She had complaints of left ankle joint pain and right knee joint pain.  She was found to have minimally displaced right fibula fracture.  Her right foot was placed in the splint and she was discharged home with plans for outpatient follow-up with orthopedic surgeon.   However, she did not do well at home.  She has been unable to walk using her rolling walker.  She has not had any help at home and remained in her recliner.  She says she was peeing on herself because of inability to ambulate.  She presented to the ED again on 03/22/2023.       Assessment/Plan:   Principal Problem:   Closed fracture of distal end of right fibula with routine healing Active Problems:   Fibula fracture    Body mass index is 40.56 kg/m.  (Morbid obesity).  This complicates overall care and prognosis   Subacute closed minimally displaced right distal fibula fracture: Appreciate input from Dr. Allena Katz, podiatrist.  Analgesics as needed for pain.    Probable acute UTI: Urinalysis from 03/22/2023 showed moderate leukocytes, positive nitrite, rare bacteria and wbc 21-50.  Urine cultures pending.  Continue IV ceftriaxone for now. Of note patient also has underlying stress incontinence.   Generalized weakness: PT and OT recommended discharge to SNF.    Chronic diastolic CHF: CHF appears compensated. Continue on home dose of aldactone, torsemide. Monitor I/Os    Other comorbidities include  hypertension, hypothyroidism, anxiety, dementia   Diet Order             Diet Heart Room service appropriate? Yes; Fluid consistency: Thin  Diet effective now                            Consultants: Podiatrist  Procedures: None    Medications:    amLODipine  5 mg Oral Daily   vitamin C  1,000 mg Oral Daily   carvedilol  50 mg Oral BID WC   cholecalciferol  2,000 Units Oral Daily   donepezil  10 mg Oral Daily   folic acid  500 mcg Oral Daily   heparin  5,000 Units Subcutaneous Q8H   levothyroxine  112 mcg Oral Q0600   losartan  100 mg Oral Daily   melatonin  5 mg Oral QHS   montelukast  10 mg Oral QPM   pantoprazole  40 mg Oral Daily   spironolactone  25 mg Oral Daily   topiramate  25 mg Oral Daily   torsemide  10 mg Oral Daily   traZODone  50 mg Oral QHS   venlafaxine XR  150 mg Oral BID   Continuous Infusions:  cefTRIAXone (ROCEPHIN)  IV Stopped (03/24/23 2201)     Anti-infectives (From admission, onward)    Start     Dose/Rate Route Frequency Ordered Stop   03/24/23 2045  cefTRIAXone (ROCEPHIN) 1 g in sodium chloride 0.9 % 100 mL IVPB  1 g 200 mL/hr over 30 Minutes Intravenous Every 24 hours 03/24/23 1945                Family Communication/Anticipated D/C date and plan/Code Status   DVT prophylaxis: heparin injection 5,000 Units Start: 03/22/23 2200     Code Status: Full Code  Family Communication: None Disposition Plan: Plan to discharge to SNF   Status is: Inpatient Remains inpatient appropriate because: Awaiting placement to SNF       Subjective:   Interval events noted.  She still has pain in the right foot.  No dysuria.  Patient states she normally has increased frequency of micturition because she takes a water pill.  She also reports history of stress incontinence especially from coughing.  No dysuria.  Objective:    Vitals:   03/24/23 1522 03/24/23 2359 03/25/23 0808 03/25/23 1322  BP: 132/67 133/61  (!) 148/65 (!) 129/59  Pulse: 69 70 73 75  Resp: 16 20 16    Temp: 98.1 F (36.7 C) 98 F (36.7 C) 98.4 F (36.9 C) 98 F (36.7 C)  TempSrc:   Oral Oral  SpO2: 100% 97% 100% 100%  Weight:      Height:       No data found.   Intake/Output Summary (Last 24 hours) at 03/25/2023 1354 Last data filed at 03/25/2023 0900 Gross per 24 hour  Intake 675.58 ml  Output --  Net 675.58 ml   Filed Weights   03/22/23 1147 03/24/23 0500  Weight: 112.9 kg 114 kg    Exam:  GEN: NAD SKIN: Warm and dry EYES: No pallor or icterus ENT: MMM CV: RRR PULM: CTA B ABD: soft, obese, NT, +BS CNS: AAO x 3, non focal EXT: No edema or tenderness        Data Reviewed:   I have personally reviewed following labs and imaging studies:  Labs: Labs show the following:   Basic Metabolic Panel: Recent Labs  Lab 03/22/23 1149 03/23/23 0400 03/24/23 0644  NA 138 136 135  K 4.4 4.4 4.0  CL 108 110 104  CO2 22 21* 22  GLUCOSE 97 100* 104*  BUN 20 18 23   CREATININE 0.94 0.95 1.04*  CALCIUM 9.1 9.2 9.3   GFR Estimated Creatinine Clearance: 58.1 mL/min (A) (by C-G formula based on SCr of 1.04 mg/dL (H)). Liver Function Tests: No results for input(s): "AST", "ALT", "ALKPHOS", "BILITOT", "PROT", "ALBUMIN" in the last 168 hours. No results for input(s): "LIPASE", "AMYLASE" in the last 168 hours. No results for input(s): "AMMONIA" in the last 168 hours. Coagulation profile No results for input(s): "INR", "PROTIME" in the last 168 hours.  CBC: Recent Labs  Lab 03/22/23 1149 03/23/23 0400 03/24/23 0644  WBC 10.7* 8.9 7.4  NEUTROABS 7.8*  --   --   HGB 10.6* 10.2* 10.7*  HCT 31.4* 30.5* 31.6*  MCV 96.9 97.8 96.6  PLT 224 201 201   Cardiac Enzymes: Recent Labs  Lab 03/22/23 1149  CKTOTAL 39   BNP (last 3 results) No results for input(s): "PROBNP" in the last 8760 hours. CBG: No results for input(s): "GLUCAP" in the last 168 hours. D-Dimer: No results for input(s): "DDIMER"  in the last 72 hours. Hgb A1c: No results for input(s): "HGBA1C" in the last 72 hours. Lipid Profile: No results for input(s): "CHOL", "HDL", "LDLCALC", "TRIG", "CHOLHDL", "LDLDIRECT" in the last 72 hours. Thyroid function studies: No results for input(s): "TSH", "T4TOTAL", "T3FREE", "THYROIDAB" in the last 72 hours.  Invalid input(s): "  FREET3" Anemia work up: No results for input(s): "VITAMINB12", "FOLATE", "FERRITIN", "TIBC", "IRON", "RETICCTPCT" in the last 72 hours. Sepsis Labs: Recent Labs  Lab 03/22/23 1149 03/23/23 0400 03/24/23 0644  WBC 10.7* 8.9 7.4    Microbiology No results found for this or any previous visit (from the past 240 hour(s)).  Procedures and diagnostic studies:  No results found.             LOS: 2 days   [name]  [facility]   Pager on [email or web address]. If 7PM-7AM, please contact night-coverage at [email or web address]     03/25/2023, 1:54 PM

## 2023-03-25 NOTE — Progress Notes (Addendum)
Occupational Therapy Treatment Patient Details Name: [name] MRN: [id number] DOB: [birth year] Today's Date: 03/25/2023   History of present illness Pt. is a 77yoF who [facility] on [facility]23 after a fall and collapse onto BR floor at home, DC back to home from ED same day. Pt comes back 03/22/23 with ongoing remarkable weakness, inability to walk. Per EMS pt down on floor for 48 hours. PMH: HTN, CHF, CKD, hypoTSH. Imaging revealing of Rt distal fibular fracture. Podiatry consulting on 10/7, recommends conservative management in splint, NWB.   OT comments  Pt. reports 7-8/10 pain in the RLE. Pt. Reports that it has been a bad day, reporting pain, and that she was told she has no more rehab days left, and can't go to rehab. Pt. Reports being concerned that she can not return home because she has nobody to stay with her. Pt. Education was provided about A/E use for LE ADLs.  Pt. Reports currently having reachers, and access to a sockaide at home, however has had limited use of  it for LE dressing. Pt. education was provided about positioning as well as strategies to assist with ADLs with current mobility limitations. Reviewed Pt. NonWeightbearing. Pt. May benefit from UE strengthening to assist in preparation for  transfers, and ADLs. Pt. Continues to benefit from OT services for ADL training, A/E training, there. Ex., and Pt. education about home modification, and DME. Pt. Was left with all needs met, and fresh water/ice.       If plan is discharge home, recommend the following:  A lot of help with bathing/dressing/bathroom;A lot of help with walking and/or transfers;Two people to help with walking and/or transfers;Assist for transportation;Assistance with cooking/housework   Equipment Recommendations       Recommendations for Other Services      Precautions / Restrictions Restrictions Weight Bearing Restrictions: Yes RLE Weight Bearing: Non weight bearing       Mobility  Bed Mobility Overal bed mobility: Needs Assistance Bed Mobility: Supine to Sit     Supine to sit: Min assist, HOB elevated          Transfers                   General transfer comment: Deferred     Balance                                           ADL either performed or assessed with clinical judgement   ADL   Eating/Feeding: Independent;Set up   Grooming: Independent;Set up               Lower Body Dressing: Maximal assistance                      Extremity/Trunk Assessment Upper Extremity Assessment RUE Deficits / Details: AROM shoulder flexion to approx 80 degrees, pt reports arthritic shoulder LUE Deficits / Details: AROM shoulder flexion to approx 70 degrees, pt reports arthritic shoulder            Vision       Perception     Praxis      Cognition Arousal: Alert Behavior During Therapy: [facility] for tasks assessed/performed  Exercises      Shoulder Instructions       General Comments      Pertinent Vitals/ Pain       Pain Assessment Pain Assessment: 0-10 Pain Score: 8  Pain Location: Left lower leg Pain Intervention(s): Limited activity within patient's tolerance, Monitored during session (Pt. reports having received Tylenol)  Home Living                                          Prior Functioning/Environment              Frequency  Min 1X/week        Progress Toward Goals  OT Goals(current goals can now be found in the care plan section)  Progress towards OT goals: Progressing toward goals  Acute Rehab OT Goals Patient Stated Goal: To go to rehab OT Goal Formulation: With patient Time For Goal Achievement: 04/06/23 Potential to Achieve Goals: Good  Plan      Co-evaluation                 AM-PAC OT "6 Clicks" Daily Activity     Outcome Measure   Help from another person eating meals?:  None Help from another person taking care of personal grooming?: A Little Help from another person toileting, which includes using toliet, bedpan, or urinal?: Total Help from another person bathing (including washing, rinsing, drying)?: A Lot Help from another person to put on and taking off regular upper body clothing?: A Lot Help from another person to put on and taking off regular lower body clothing?: A Lot 6 Click Score: 14    End of Session    OT Visit Diagnosis: Unsteadiness on feet (R26.81);Muscle weakness (generalized) (M62.81);History of falling (Z91.81);Other abnormalities of gait and mobility (R26.89)   Activity Tolerance Patient limited by pain;Patient limited by fatigue   Patient Left with call bell/phone within reach;in chair   Nurse Communication          Time: 1337-1400 OT Time Calculation (min): 23 min  Charges: OT General Charges $OT Visit: 1 Visit OT Treatments $Self Care/Home Management : 23-37 mins [name], MS, OTR/L   [name] 03/25/2023, 2:40 PM

## 2023-03-25 NOTE — Plan of Care (Signed)
°  Problem: Education: °Goal: Ability to demonstrate management of disease process will improve °Outcome: Progressing °Goal: Ability to verbalize understanding of medication therapies will improve °Outcome: Progressing °Goal: Individualized Educational Video(s) °Outcome: Progressing °  °Problem: Activity: °Goal: Capacity to carry out activities will improve °Outcome: Progressing °  °Problem: Cardiac: °Goal: Ability to achieve and maintain adequate cardiopulmonary perfusion will improve °Outcome: Progressing °  °Problem: Health Behavior/Discharge Planning: °Goal: Ability to manage health-related needs will improve °Outcome: Progressing °  °Problem: Clinical Measurements: °Goal: Ability to maintain clinical measurements within normal limits will improve °Outcome: Progressing °Goal: Will remain free from infection °Outcome: Progressing °Goal: Diagnostic test results will improve °Outcome: Progressing °Goal: Respiratory complications will improve °Outcome: Progressing °Goal: Cardiovascular complication will be avoided °Outcome: Progressing °  °Problem: Activity: °Goal: Risk for activity intolerance will decrease °Outcome: Progressing °  °Problem: Nutrition: °Goal: Adequate nutrition will be maintained °Outcome: Progressing °  °Problem: Coping: °Goal: Level of anxiety will decrease °Outcome: Progressing °  °Problem: Elimination: °Goal: Will not experience complications related to bowel motility °Outcome: Progressing °Goal: Will not experience complications related to urinary retention °Outcome: Progressing °  °Problem: Pain Managment: °Goal: General experience of comfort will improve °Outcome: Progressing °  °Problem: Safety: °Goal: Ability to remain free from injury will improve °Outcome: Progressing °  °Problem: Skin Integrity: °Goal: Risk for impaired skin integrity will decrease °Outcome: Progressing °  °

## 2023-03-26 ENCOUNTER — Telehealth: Payer: Self-pay | Admitting: Podiatry

## 2023-03-26 DIAGNOSIS — S82831D Other fracture of upper and lower end of right fibula, subsequent encounter for closed fracture with routine healing: Secondary | ICD-10-CM | POA: Diagnosis not present

## 2023-03-26 LAB — URINE CULTURE: Culture: 30000 — AB

## 2023-03-26 NOTE — TOC Initial Note (Signed)
Transition of Care [facility]) - Initial/Assessment Note    Patient Details  Name: [name] MRN: [id number] Date of Birth: 05/03/1946  Transition of Care [facility]) CM/SW Contact:    [name], RN Phone Number: 03/26/2023, 11:15 AM  Clinical Narrative:                  Met with the patient She lives at home with her husband He is currently in [facility] She wants to go to [facility], They are willing to accept her However; she has no remaining bed days with insurance to cover rehab She does not qualify for Medicaid She has been working with Annice Pih at [employer], I spoke with Annice Pih, they are working to see what they can do to help her They have an ongoing investigation for protection order for her husband At this time [employer] has no ability to assist but they stated that they will continue to do what they can and let us know what they might be able to do to help her I explained this to the patient and she stated she already knew all of tis and spoke to [place] at [employer] yesterday and this morning She stated noone cares about her, I explained we do care however the resources we have are limited She stated that she has no money and can not pay out of pocket for STR She stated she could not go home and could not take care of herself with her foot/ankle the way it is, I told her I would let her know what if anything DSS can come up with but I have no additional resources  Expected Discharge Plan: Skilled Nursing Facility Barriers to Discharge: Inadequate or no insurance (no remaining bed days)   Patient Goals and CMS Choice            Expected Discharge Plan and Services In-house Referral: NA Discharge Planning Services: CM Consult   Living arrangements for the past 2 months: Single Family Home                                      Prior Living Arrangements/Services Living arrangements for the past 2 months: Single Family Home Lives with::  Self, Spouse              Current home services: DME (rolling walker)    Activities of Daily Living   ADL Screening (condition at time of admission) Independently performs ADLs?: No Does the patient have a NEW difficulty with bathing/dressing/toileting/self-feeding that is expected to last >3 days?: Yes (Initiates electronic notice to provider for possible OT consult) Does the patient have a NEW difficulty with getting in/out of bed, walking, or climbing stairs that is expected to last >3 days?: Yes (Initiates electronic notice to provider for possible PT consult) Does the patient have a NEW difficulty with communication that is expected to last >3 days?: No Is the patient deaf or have difficulty hearing?: No Does the patient have difficulty seeing, even when wearing glasses/contacts?: No Does the patient have difficulty concentrating, remembering, or making decisions?: No  Permission Sought/Granted                  Emotional Assessment              Admission diagnosis:  Generalized weakness [R53.1] Closed left tibial fracture [S82.202A] Closed fracture of distal end of right fibula with  routine healing, unspecified fracture morphology, subsequent encounter [S82.831D] Fibula fracture [S82.409A] Patient Active Problem List   Diagnosis Date Noted   Closed fracture of distal end of right fibula with routine healing 03/23/2023   Fibula fracture 03/23/2023   Acute renal failure superimposed on stage 3b chronic kidney disease (HCC) 10/27/2022   Weakness 10/27/2022   Venous stasis 10/27/2022   Community acquired pneumonia 07/03/2020   OSA (obstructive sleep apnea) 07/03/2020   CAP (community acquired pneumonia) 07/03/2020   Sepsis (HCC) 07/02/2020   History of anesthesia reaction 02/20/2020   Swelling of limb 02/20/2020   Chronic diastolic CHF (congestive heart failure) (HCC) 08/22/2019   Edema 07/29/2019   Prediabetes 07/07/2019   Depression 04/26/2019   Disease of  thyroid gland 04/26/2019   GERD (gastroesophageal reflux disease) 04/26/2019   History of head injury 04/26/2019   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/26/2019   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   Spasm of lumbar paraspinous muscle 02/14/2019   Chronic pain syndrome 02/14/2019   Use of cane as ambulatory aid 01/04/2019   Acneiform eruption 07/09/2017   Lumbar spondylosis 04/20/2017   Lumbar facet arthropathy 04/20/2017   Lumbar degenerative disc disease 04/20/2017   Anxiety 11/05/2016   Facet arthritis of lumbar region 09/16/2016   Low back pain 07/25/2016   Morbid obesity with BMI of 40.0-44.9, adult (HCC) 06/03/2016   Acquired hypothyroidism 03/04/2016   Cognitive deficit as late effect of traumatic brain injury (HCC) 03/04/2016   Health care maintenance 03/04/2016   HTN, goal below 140/90 03/04/2016   Hypokalemia 03/04/2016   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   Localized primary osteoarthritis of lower leg, right 08/21/2015   Primary osteoarthritis of left knee 04/10/2015   Right knee pain 01/18/2013   PCP:  Lauro Regulus, MD Pharmacy:   [id number] [place] Rough, [place] - [address] AT [facility] OF [address] [place] [address] Phone: [phone] Fax: [phone]  [id number] - [place], [place] - [address] AT [facility] [address] [facility] [address] [place] [address] Phone: [phone] Fax: [phone]     Social Determinants of Health (SDOH) Social History: SDOH Screenings   Food Insecurity: No Food Insecurity (03/23/2023)  Housing: Low Risk  (03/23/2023)  Transportation Needs: No Transportation Needs (03/23/2023)  Utilities: Not At Risk (03/23/2023)  Depression (PHQ2-9): Low Risk  (11/11/2022)  Social Connections: Unknown (10/26/2021)   [facility] from [facility]  Tobacco Use: Low Risk  (03/23/2023)   SDOH Interventions:      Readmission Risk Interventions     No data to display

## 2023-03-26 NOTE — Progress Notes (Signed)
Physical Therapy Treatment Patient Details Name: [name] MRN: [id number] DOB: February 12, 1946 Today's Date: 03/26/2023   History of Present Illness Pt. is a 77yoF who comes to [facility] on 03/20/23 after a fall and collapse onto BR floor at home, DC back to home from ED same day. Pt comes back 03/22/23 with ongoing remarkable weakness, inability to walk. Per EMS pt down on floor for 48 hours. PMH: HTN, CHF, CKD, hypoTSH. Imaging revealing of Rt distal fibular fracture. Podiatry consulting on 10/7, recommends conservative management in splint, NWB.    PT Comments  Pt received supine in bed agreeable to PT services. Require minA+1 for bed mobility to EOB and modA+2 for R lateral scoots for LLE and BUE strengthening. 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 Pt reliant on MaxA+2 stand/squat pivot to recliner. Reviewed seated therex listed below. Pt remains very functionally limited due to inability to use UE's and LLE to support herself in standing to perform functional mobility while adhering to RLE NWB precautions. Heavily continue to recommend skilled PT to progress functional abilities. Pt remains unsafe to return home at this time.     If plan is discharge home, recommend the following: Two people to help with walking and/or transfers;Two people to help with bathing/dressing/bathroom;Assistance with cooking/housework;Assist for transportation;Help with stairs or ramp for entrance   Can travel by private vehicle     No  Equipment Recommendations  None recommended by PT    Recommendations for Other Services       Precautions / Restrictions Precautions Precautions: Fall Restrictions Weight Bearing Restrictions: Yes RLE Weight Bearing: Non weight bearing     Mobility  Bed Mobility Overal bed mobility: Needs Assistance Bed Mobility: Supine to Sit     Supine to sit: Min assist, HOB elevated       Patient Response:  Cooperative  Transfers Overall transfer level: Needs assistance Equipment used: None Transfers: Bed to chair/wheelchair/BSC       Squat pivot transfers: Max assist, +2 physical assistance          Ambulation/Gait                   Stairs             Wheelchair Mobility     Tilt Bed Tilt Bed Patient Response: Cooperative  Modified Rankin (Stroke Patients Only)       Balance Overall balance assessment: Needs assistance Sitting-balance support: Feet supported Sitting balance-Leahy Scale: Good     Standing balance support: Bilateral upper extremity supported, During functional activity, Reliant on assistive device for balance Standing balance-Leahy Scale: Zero Standing balance comment: requires maxA+2 to maintain standing. Difficulty maintaining NWB.                            Cognition Arousal: Alert Behavior During Therapy: WFL for tasks assessed/performed Overall Cognitive Status: Within Functional Limits for tasks assessed                                          Exercises General Exercises - Lower Extremity Ankle Circles/Pumps: AROM, Left, Strengthening, 10 reps, Supine Short Arc Quad: AROM, Strengthening, Both, 10 reps, Supine Long Arc Quad: AROM, Strengthening, Both, 10 reps, Seated Hip ABduction/ADduction: AROM, Strengthening, Both, 10 reps, Supine Hip Flexion/Marching: AROM,  Strengthening, Both, 10 reps, Seated Other Exercises Other Exercises: x2 R lateral side scoots toward HOB. modA+2    General Comments        Pertinent Vitals/Pain Pain Assessment Pain Assessment: Faces Faces Pain Scale: Hurts a little bit Pain Location: RIght lower leg Pain Descriptors / Indicators: Sore, Aching Pain Intervention(s): Limited activity within patient's tolerance, Monitored during session, Repositioned    Home Living                          Prior Function            PT Goals (current goals can now  be found in the care plan section) Acute Rehab PT Goals Patient Stated Goal: regain strength PT Goal Formulation: With patient Time For Goal Achievement: 04/06/23 Potential to Achieve Goals: Fair Progress towards PT goals: Not progressing toward goals - comment    Frequency    Min 1X/week      PT Plan      Co-evaluation              AM-PAC PT "6 Clicks" Mobility   Outcome Measure  Help needed turning from your back to your side while in a flat bed without using bedrails?: A Little Help needed moving from lying on your back to sitting on the side of a flat bed without using bedrails?: A Little Help needed moving to and from a bed to a chair (including a wheelchair)?: Total Help needed standing up from a chair using your arms (e.g., wheelchair or bedside chair)?: Total Help needed to walk in hospital room?: Total Help needed climbing 3-5 steps with a railing? : Total 6 Click Score: 10    End of Session Equipment Utilized During Treatment: Gait belt Activity Tolerance: Patient tolerated treatment well Patient left: in chair;with call bell/phone within reach Nurse Communication: Mobility status;Weight bearing status PT Visit Diagnosis: Difficulty in walking, not elsewhere classified (R26.2);Other abnormalities of gait and mobility (R26.89);Muscle weakness (generalized) (M62.81)     Time: 0865-7846 PT Time Calculation (min) (ACUTE ONLY): 18 min  Charges:    $Therapeutic Exercise: 8-22 mins PT General Charges $$ ACUTE PT VISIT: 1 Visit                     Delphia Grates. Fairly IV, PT, DPT Physical Therapist- [place]  [facility]  03/26/2023, 3:50 PM

## 2023-03-26 NOTE — Plan of Care (Signed)
  Problem: Education: Goal: Ability to demonstrate management of disease process will improve Outcome: Progressing Goal: Ability to verbalize understanding of medication therapies will improve Outcome: Progressing Goal: Individualized Educational Video(s) Outcome: Progressing   Problem: Activity: Goal: Capacity to carry out activities will improve Outcome: Progressing   Problem: Clinical Measurements: Goal: Ability to maintain clinical measurements within normal limits will improve Outcome: Progressing Goal: Will remain free from infection Outcome: Progressing Goal: Diagnostic test results will improve Outcome: Progressing Goal: Respiratory complications will improve Outcome: Progressing Goal: Cardiovascular complication will be avoided Outcome: Progressing   Problem: Nutrition: Goal: Adequate nutrition will be maintained Outcome: Progressing   Problem: Elimination: Goal: Will not experience complications related to bowel motility Outcome: Progressing Goal: Will not experience complications related to urinary retention Outcome: Progressing   Problem: Safety: Goal: Ability to remain free from injury will improve Outcome: Progressing

## 2023-03-26 NOTE — Telephone Encounter (Signed)
Called pt to get her scheduled to follow up with our office and she stated she was to have someone come change her cast for 3 days now and no one has came yet. She cannot get up to go tell anyone. She said Dr Allena Katz seen pt yesterday and told someone to come change it and no one has come yet.

## 2023-03-26 NOTE — TOC Progression Note (Signed)
Transition of Care [facility]) - Progression Note    Patient Details  Name: [name] MRN: [id number] Date of Birth: [birth year]  Transition of Care [place]) CM/SW Contact  [name], RN Phone Number: 03/26/2023, 1:22 PM  Clinical Narrative:     Sherron Monday with Annice Pih at [facility] and she stated that she spoke with the patient this morning and the reason she did not qualify for Medicaid is due to not sending in the appropriate forms, Annice Pih reported that she spoke with [name] and they are willing to help her with long term medicaid I reached out to [facility] and they are going to help her apply for Long term medicaid and they will let me know what needs to happen and when she can go to [facility]   Expected Discharge Plan: Skilled Nursing Facility Barriers to Discharge: Inadequate or no insurance (no remaining bed days)  Expected Discharge Plan and Services In-house Referral: NA Discharge Planning Services: CM Consult   Living arrangements for the past 2 months: Single Family Home                                       Social Determinants of Health (SDOH) Interventions SDOH Screenings   Food Insecurity: No Food Insecurity (03/23/2023)  Housing: Low Risk  (03/23/2023)  Transportation Needs: No Transportation Needs (03/23/2023)  Utilities: Not At Risk (03/23/2023)  Depression (PHQ2-9): Low Risk  (11/11/2022)  Social Connections: Unknown (10/26/2021)   Received from [facility], [facility]  Tobacco Use: Low Risk  (03/23/2023)    Readmission Risk Interventions     No data to display

## 2023-03-26 NOTE — Care Management Important Message (Signed)
Important Message  Patient Details  Name: [name] MRN: [id number] Date of Birth: [birth year]   Important Message Given:  N/A - LOS <3 / Initial given by admissions     [name] 03/26/2023, 11:13 AM

## 2023-03-26 NOTE — Progress Notes (Signed)
Progress Note    [name]  DGU:[id number] DOB: 05-01-1946  DOA: 03/22/2023 PCP: Lauro Regulus, MD      Brief Narrative:    Medical records reviewed and are as summarized below:  [name] is a 77 y.o. female with medical history significant of morbid obesity, chronic diastolic CHF, stage III CKD, hypertension, hypothyroidism, depression, and cognitive impairment.  She initially presented to the ED on 03/20/2023 after mechanical fall at home on level ground.  She had complaints of left ankle joint pain and right knee joint pain.  She was found to have minimally displaced right fibula fracture.  Her right foot was placed in the splint and she was discharged home with plans for outpatient follow-up with orthopedic surgeon.   However, she did not do well at home.  She has been unable to walk using her rolling walker.  She has not had any help at home and remained in her recliner.  She says she was peeing on herself because of inability to ambulate.  She presented to the ED again on 03/22/2023.       Assessment/Plan:   Principal Problem:   Closed fracture of distal end of right fibula with routine healing Active Problems:   Fibula fracture    Body mass index is 40.56 kg/m.  (Morbid obesity).  This complicates overall care and prognosis   Subacute closed minimally displaced right distal fibula fracture: Plan discussed with Dr. Nicholes Rough, podiatrist.  He is unable to change right leg cast at this time because there is no cast cutter.  He is considering using a cam boot in the near future.  Analgesics as needed for pain.    Probable acute UTI: Urinalysis from 03/22/2023 showed moderate leukocytes, positive nitrite, rare bacteria and wbc 21-50.  Urine culture showed 30,000 colonies per mL multiple species.   Plan to complete 3 days of IV ceftriaxone today.   Of note patient also has underlying stress incontinence.   Generalized  weakness: PT and OT recommended discharge to SNF. Plan discussed with Deliliah, case Production designer, theatre/television/film.  Patient has run out of rehab days and therefore does not have insurance approval to go to SNF at this time.  She does not qualify for Medicaid.  DSS is on the case.    Chronic diastolic CHF: CHF appears compensated.  Continue torsemide and Aldactone.     Other comorbidities include hypertension, hypothyroidism, anxiety, dementia   Diet Order             Diet Heart Room service appropriate? Yes; Fluid consistency: Thin  Diet effective now                            Consultants: Podiatrist  Procedures: None    Medications:    amLODipine  5 mg Oral Daily   vitamin C  1,000 mg Oral Daily   carvedilol  50 mg Oral BID WC   cholecalciferol  2,000 Units Oral Daily   donepezil  10 mg Oral Daily   folic acid  500 mcg Oral Daily   heparin  5,000 Units Subcutaneous Q8H   levothyroxine  112 mcg Oral Q0600   losartan  100 mg Oral Daily   melatonin  5 mg Oral QHS   montelukast  10 mg Oral QPM   pantoprazole  40 mg Oral Daily   spironolactone  25 mg Oral Daily   topiramate  25  mg Oral Daily   torsemide  10 mg Oral Daily   traZODone  50 mg Oral QHS   venlafaxine XR  150 mg Oral BID   Continuous Infusions:  cefTRIAXone (ROCEPHIN)  IV Stopped (03/25/23 2100)     Anti-infectives (From admission, onward)    Start     Dose/Rate Route Frequency Ordered Stop   03/24/23 2045  cefTRIAXone (ROCEPHIN) 1 g in sodium chloride 0.9 % 100 mL IVPB        1 g 200 mL/hr over 30 Minutes Intravenous Every 24 hours 03/24/23 1945                Family Communication/Anticipated D/C date and plan/Code Status   DVT prophylaxis: heparin injection 5,000 Units Start: 03/22/23 2200     Code Status: Full Code  Family Communication: None Disposition Plan: Plan to discharge to SNF   Status is: Inpatient Remains inpatient appropriate because: Awaiting placement to  SNF       Subjective:   Interval events noted.  She complains of pain in the right foot.  No new complaints.  Objective:    Vitals:   03/25/23 1322 03/25/23 1715 03/26/23 0117 03/26/23 0740  BP: (!) 129/59 132/61 (!) 127/59 (!) 146/66  Pulse: 75 79 77 74  Resp:  18 18 18   Temp: 98 F (36.7 C) 98.5 F (36.9 C) 97.7 F (36.5 C) 98.3 F (36.8 C)  TempSrc: Oral   Oral  SpO2: 100% 98% 95% 99%  Weight:      Height:       No data found.   Intake/Output Summary (Last 24 hours) at 03/26/2023 1228 Last data filed at 03/26/2023 1025 Gross per 24 hour  Intake 840 ml  Output --  Net 840 ml   Filed Weights   03/22/23 1147 03/24/23 0500  Weight: 112.9 kg 114 kg    Exam:  GEN: NAD SKIN: Warm and dry EYES: No pallor or icterus ENT: MMM CV: RRR PULM: CTA B ABD: soft, obese, NT, +BS CNS: AAO x 3, non focal EXT: Left leg edema.  Cast on right leg.        Data Reviewed:   I have personally reviewed following labs and imaging studies:  Labs: Labs show the following:   Basic Metabolic Panel: Recent Labs  Lab 03/22/23 1149 03/23/23 0400 03/24/23 0644  NA 138 136 135  K 4.4 4.4 4.0  CL 108 110 104  CO2 22 21* 22  GLUCOSE 97 100* 104*  BUN 20 18 23   CREATININE 0.94 0.95 1.04*  CALCIUM 9.1 9.2 9.3   GFR Estimated Creatinine Clearance: 58.1 mL/min (A) (by C-G formula based on SCr of 1.04 mg/dL (H)). Liver Function Tests: No results for input(s): "AST", "ALT", "ALKPHOS", "BILITOT", "PROT", "ALBUMIN" in the last 168 hours. No results for input(s): "LIPASE", "AMYLASE" in the last 168 hours. No results for input(s): "AMMONIA" in the last 168 hours. Coagulation profile No results for input(s): "INR", "PROTIME" in the last 168 hours.  CBC: Recent Labs  Lab 03/22/23 1149 03/23/23 0400 03/24/23 0644  WBC 10.7* 8.9 7.4  NEUTROABS 7.8*  --   --   HGB 10.6* 10.2* 10.7*  HCT 31.4* 30.5* 31.6*  MCV 96.9 97.8 96.6  PLT 224 201 201   Cardiac  Enzymes: Recent Labs  Lab 03/22/23 1149  CKTOTAL 39   BNP (last 3 results) No results for input(s): "PROBNP" in the last 8760 hours. CBG: No results for input(s): "GLUCAP" in the last  168 hours. D-Dimer: No results for input(s): "DDIMER" in the last 72 hours. Hgb A1c: No results for input(s): "HGBA1C" in the last 72 hours. Lipid Profile: No results for input(s): "CHOL", "HDL", "LDLCALC", "TRIG", "CHOLHDL", "LDLDIRECT" in the last 72 hours. Thyroid function studies: No results for input(s): "TSH", "T4TOTAL", "T3FREE", "THYROIDAB" in the last 72 hours.  Invalid input(s): "FREET3" Anemia work up: No results for input(s): "VITAMINB12", "FOLATE", "FERRITIN", "TIBC", "IRON", "RETICCTPCT" in the last 72 hours. Sepsis Labs: Recent Labs  Lab 03/22/23 1149 03/23/23 0400 03/24/23 0644  WBC 10.7* 8.9 7.4    Microbiology Recent Results (from the past 240 hour(s))  Urine Culture (for pregnant, neutropenic or urologic patients or patients with an indwelling urinary catheter)     Status: Abnormal   Collection Time: 03/25/23  7:06 AM   Specimen: Urine, Clean Catch  Result Value Ref Range Status   Specimen Description   Final    URINE, CLEAN CATCH Performed at [facility], [address] [place], [place] [address]    Special Requests   Final    NONE Performed at [facility], [address] [place], [place] [address]    Culture (A)  Final    30,000 COLONIES/mL MULTIPLE SPECIES PRESENT, SUGGEST RECOLLECTION   Report Status 03/26/2023 FINAL  Final    Procedures and diagnostic studies:  No results found.             LOS: 3 days   Sanaz Scarlett  [facility]   Pager on [email or web address]. If 7PM-7AM, please contact night-coverage at [email or web address]     03/26/2023, 12:28 PM

## 2023-03-26 NOTE — TOC Progression Note (Signed)
Transition of Care [facility]) - Progression Note    Patient Details  Name: [name] MRN: [id number] Date of Birth: 1945/08/07  Transition of [facility]) CM/SW Contact  [name], RN Phone Number: 03/26/2023, 2:01 PM  Clinical Narrative:     Met with the patient and asked her to call Stacy Gardner at [facility] to discuss Long term medicaid application She stated that she will call her  Expected Discharge Plan: Skilled Nursing Facility Barriers to Discharge: Inadequate or no insurance (no remaining bed days)  Expected Discharge Plan and Services In-house Referral: NA Discharge Planning Services: CM Consult   Living arrangements for the past 2 months: Single Family Home                                       Social Determinants of Health (SDOH) Interventions SDOH Screenings   Food Insecurity: No Food Insecurity (03/23/2023)  Housing: Low Risk  (03/23/2023)  Transportation Needs: No Transportation Needs (03/23/2023)  Utilities: Not At Risk (03/23/2023)  Depression (PHQ2-9): Low Risk  (11/11/2022)  Social Connections: Unknown (10/26/2021)   Received from [facility], [facility]  Tobacco Use: Low Risk  (03/23/2023)    Readmission Risk Interventions     No data to display

## 2023-03-27 ENCOUNTER — Inpatient Hospital Stay: Payer: Medicare Other

## 2023-03-27 DIAGNOSIS — S82821D Torus fracture of lower end of right fibula, subsequent encounter for fracture with routine healing: Secondary | ICD-10-CM | POA: Diagnosis not present

## 2023-03-27 DIAGNOSIS — S9032XA Contusion of left foot, initial encounter: Secondary | ICD-10-CM | POA: Diagnosis not present

## 2023-03-27 DIAGNOSIS — S82831D Other fracture of upper and lower end of right fibula, subsequent encounter for closed fracture with routine healing: Secondary | ICD-10-CM | POA: Diagnosis not present

## 2023-03-27 NOTE — Progress Notes (Signed)
PODIATRY PROGRESS NOTE  NAME [name] MRN [id number] DOB [birth year] DOA 03/22/2023   Reason for consult:  Chief Complaint  Patient presents with   Fall    Pt seen here for fall x2 days ago. Per pt/EMS she has been sitting in the same spot for over 48hrs.     History of present illness: 77 y.o. female 77 y.o. female with medical history significant of morbid obesity, chronic diastolic CHF, stage III CKD, hypertension, hypothyroidism, depression, and cognitive impairment.  She initially presented to the ED on 03/20/2023 after mechanical fall at home on level ground.  She had complaints of left ankle joint pain and right knee joint pain.  She was found to have minimally displaced right fibula fracture. Cast was applied but it is rubbing. No other concerns.   Vitals:   03/26/23 2339 03/27/23 0752  BP: (!) 111/50 (!) 143/66  Pulse: 74 78  Resp: 16 17  Temp: 98.1 F (36.7 C) 98.3 F (36.8 C)  SpO2: 96% 100%       Latest Ref Rng & Units 03/24/2023    6:44 AM 03/23/2023    4:00 AM 03/22/2023   11:49 AM  CBC  WBC 4.0 - 10.5 K/uL 7.4  8.9  10.7   Hemoglobin 12.0 - 15.0 g/dL 33.2  95.1  88.4   Hematocrit 36.0 - 46.0 % 31.6  30.5  31.4   Platelets 150 - 400 K/uL 201  201  224        Latest Ref Rng & Units 03/24/2023    6:44 AM 03/23/2023    4:00 AM 03/22/2023   11:49 AM  BMP  Glucose 70 - 99 mg/dL 166  063  97   BUN 8 - 23 mg/dL 23  18  20    Creatinine 0.44 - 1.00 mg/dL 0.16  0.10  9.32   Sodium 135 - 145 mmol/L 135  136  138   Potassium 3.5 - 5.1 mmol/L 4.0  4.4  4.4   Chloride 98 - 111 mmol/L 104  110  108   CO2 22 - 32 mmol/L 22  21  22    Calcium 8.9 - 10.3 mg/dL 9.3  9.2  9.1       Physical Exam: General: AAOx3, NAD  Dermatology: On removal of the cast the skin is intact and there is no open lesions.  There is minimal bruising present to the right ankle but also there is some minimal bruising to the left foot.  Vascular: Pulses palpable.  Foot is  perfused.  Neurological: Sensation intact  Musculoskeletal Exam: Mild diffuse tenderness to the left foot.  Tenderness to the right ankle.    ASSESSMENT/PLAN OF CARE Right fibular fracture, fall  -X-ray left foot ordered given pain -Cast removed on the right and CAM boot applied -NWB RLE -Ice/elevation    Please contact me directly with any questions or concerns.     Ovid Curd, DPM [facility]  Dr. Lesia Sago. Lita Flynn, DPM    [address] [place], [place] [address]                Office [phone]  Fax [phone]

## 2023-03-27 NOTE — Progress Notes (Signed)
Progress Note    [name]  ZOX:[id number] DOB: December 24, 1945  DOA: 03/22/2023 PCP: Lauro Regulus, MD      Brief Narrative:    Medical records reviewed and are as summarized below:  [name] is a 77 y.o. female with medical history significant of morbid obesity, chronic diastolic CHF, stage III CKD, hypertension, hypothyroidism, depression, and cognitive impairment.  She initially presented to the ED on 03/20/2023 after mechanical fall at home on level ground.  She had complaints of left ankle joint pain and right knee joint pain.  She was found to have minimally displaced right fibula fracture.  Her right foot was placed in the splint and she was discharged home with plans for outpatient follow-up with orthopedic surgeon.   However, she did not do well at home.  She has been unable to walk using her rolling walker.  She has not had any help at home and remained in her recliner.  She says she was peeing on herself because of inability to ambulate.  She presented to the ED again on 03/22/2023.       Assessment/Plan:   Principal Problem:   Closed fracture of distal end of right fibula with routine healing Active Problems:   Fibula fracture   Contusion of left foot    Body mass index is 40.56 kg/m.  (Morbid obesity).  This complicates overall care and prognosis   Subacute closed minimally displaced right distal fibula fracture: Cast on right leg has been removed and a cam boot has been applied. Plan discussed with Dr. Ardelle Anton, podiatrist.   Probable acute UTI: Urinalysis from 03/22/2023 showed moderate leukocytes, positive nitrite, rare bacteria and wbc 21-50.  Urine culture showed 30,000 colonies per mL multiple species.   Completed 3 days of IV ceftriaxone on 03/26/2023. Of note patient also has underlying stress incontinence.   Generalized weakness: PT and OT recommended discharge to SNF. Plan discussed with Deliliah,  case Production designer, theatre/television/film.  Patient has run out of rehab days and therefore does not have insurance approval to go to SNF at this time.  She does not qualify for Medicaid.  DSS is on the case.  No safe discharge plan at this time.    Chronic diastolic CHF: CHF appears compensated.  Continue torsemide and Aldactone.     Other comorbidities include hypertension, hypothyroidism, anxiety, dementia   Diet Order             Diet Heart Room service appropriate? Yes; Fluid consistency: Thin  Diet effective now                            Consultants: Podiatrist  Procedures: None    Medications:    amLODipine  5 mg Oral Daily   vitamin C  1,000 mg Oral Daily   carvedilol  50 mg Oral BID WC   cholecalciferol  2,000 Units Oral Daily   donepezil  10 mg Oral Daily   folic acid  500 mcg Oral Daily   heparin  5,000 Units Subcutaneous Q8H   levothyroxine  112 mcg Oral Q0600   losartan  100 mg Oral Daily   melatonin  5 mg Oral QHS   montelukast  10 mg Oral QPM   pantoprazole  40 mg Oral Daily   spironolactone  25 mg Oral Daily   topiramate  25 mg Oral Daily   torsemide  10 mg Oral Daily  traZODone  50 mg Oral QHS   venlafaxine XR  150 mg Oral BID   Continuous Infusions:     Anti-infectives (From admission, onward)    Start     Dose/Rate Route Frequency Ordered Stop   03/24/23 2045  cefTRIAXone (ROCEPHIN) 1 g in sodium chloride 0.9 % 100 mL IVPB        1 g 200 mL/hr over 30 Minutes Intravenous Every 24 hours 03/24/23 1945 03/27/23 0708              Family Communication/Anticipated D/C date and plan/Code Status   DVT prophylaxis: heparin injection 5,000 Units Start: 03/22/23 2200     Code Status: Full Code  Family Communication: None Disposition Plan: Plan to discharge to SNF   Status is: Inpatient Remains inpatient appropriate because: Awaiting placement to SNF       Subjective:   Interval events noted.  She was still has pain in the right  foot/right leg.  No other complaints.  Dr. Ardelle Anton and Dr. Jamse Arn, podiatrists, were at the bedside with the cast cutter    Objective:    Vitals:   03/26/23 1648 03/26/23 2339 03/27/23 0752 03/27/23 1457  BP:  (!) 111/50 (!) 143/66 (!) 121/56  Pulse:  74 78 75  Resp:  16 17   Temp:  98.1 F (36.7 C) 98.3 F (36.8 C)   TempSrc:  Oral Oral   SpO2: 92% 96% 100% 95%  Weight:      Height:       No data found.   Intake/Output Summary (Last 24 hours) at 03/27/2023 1522 Last data filed at 03/27/2023 1402 Gross per 24 hour  Intake 1140 ml  Output 1 ml  Net 1139 ml   Filed Weights   03/22/23 1147 03/24/23 0500  Weight: 112.9 kg 114 kg    Exam:  GEN: NAD SKIN: Warm and dry EYES: No pallor or icterus ENT: MMM CV: RRR PULM: CTA B ABD: soft, obese, NT, +BS CNS: AAO x 3, non focal EXT: Cast was still on the right leg at the time of my exam.      Data Reviewed:   I have personally reviewed following labs and imaging studies:  Labs: Labs show the following:   Basic Metabolic Panel: Recent Labs  Lab 03/22/23 1149 03/23/23 0400 03/24/23 0644  NA 138 136 135  K 4.4 4.4 4.0  CL 108 110 104  CO2 22 21* 22  GLUCOSE 97 100* 104*  BUN 20 18 23   CREATININE 0.94 0.95 1.04*  CALCIUM 9.1 9.2 9.3   GFR Estimated Creatinine Clearance: 58.1 mL/min (A) (by C-G formula based on SCr of 1.04 mg/dL (H)). Liver Function Tests: No results for input(s): "AST", "ALT", "ALKPHOS", "BILITOT", "PROT", "ALBUMIN" in the last 168 hours. No results for input(s): "LIPASE", "AMYLASE" in the last 168 hours. No results for input(s): "AMMONIA" in the last 168 hours. Coagulation profile No results for input(s): "INR", "PROTIME" in the last 168 hours.  CBC: Recent Labs  Lab 03/22/23 1149 03/23/23 0400 03/24/23 0644  WBC 10.7* 8.9 7.4  NEUTROABS 7.8*  --   --   HGB 10.6* 10.2* 10.7*  HCT 31.4* 30.5* 31.6*  MCV 96.9 97.8 96.6  PLT 224 201 201   Cardiac Enzymes: Recent Labs  Lab  03/22/23 1149  CKTOTAL 39   BNP (last 3 results) No results for input(s): "PROBNP" in the last 8760 hours. CBG: No results for input(s): "GLUCAP" in the last 168 hours. D-Dimer: No  results for input(s): "DDIMER" in the last 72 hours. Hgb A1c: No results for input(s): "HGBA1C" in the last 72 hours. Lipid Profile: No results for input(s): "CHOL", "HDL", "LDLCALC", "TRIG", "CHOLHDL", "LDLDIRECT" in the last 72 hours. Thyroid function studies: No results for input(s): "TSH", "T4TOTAL", "T3FREE", "THYROIDAB" in the last 72 hours.  Invalid input(s): "FREET3" Anemia work up: No results for input(s): "VITAMINB12", "FOLATE", "FERRITIN", "TIBC", "IRON", "RETICCTPCT" in the last 72 hours. Sepsis Labs: Recent Labs  Lab 03/22/23 1149 03/23/23 0400 03/24/23 0644  WBC 10.7* 8.9 7.4    Microbiology Recent Results (from the past 240 hour(s))  Urine Culture (for pregnant, neutropenic or urologic patients or patients with an indwelling urinary catheter)     Status: Abnormal   Collection Time: 03/25/23  7:06 AM   Specimen: Urine, Clean Catch  Result Value Ref Range Status   Specimen Description   Final    URINE, CLEAN CATCH Performed at [facility], [address], [place], [place] [address]    Special Requests   Final    NONE Performed at [facility], [address] [place], [place] [address]    Culture (A)  Final    30,000 COLONIES/mL MULTIPLE SPECIES PRESENT, SUGGEST RECOLLECTION   Report Status 03/26/2023 FINAL  Final    Procedures and diagnostic studies:  DG Foot Complete Left  Result Date: 03/27/2023 CLINICAL DATA:  Chronic left foot pain. EXAM: LEFT FOOT - COMPLETE 3+ VIEW COMPARISON:  None Available. FINDINGS: No acute fracture or dislocation. Healed calcaneal osteotomy status post single screw fixation. No evidence of hardware failure or loosening. Mild degenerative changes of the midfoot and IP joints. Osteopenia. Soft tissues are unremarkable.  IMPRESSION: 1.  No acute osseous abnormality.  Mild osteoarthritis. Electronically Signed   By: Obie Dredge M.D.   On: 03/27/2023 14:45               LOS: 4 days   [name]  [facility]   Pager on [email or web address]. If 7PM-7AM, please contact night-coverage at [email or web address]     03/27/2023, 3:22 PM

## 2023-03-27 NOTE — Plan of Care (Signed)

## 2023-03-28 DIAGNOSIS — S82831D Other fracture of upper and lower end of right fibula, subsequent encounter for closed fracture with routine healing: Secondary | ICD-10-CM | POA: Diagnosis not present

## 2023-03-28 NOTE — Progress Notes (Signed)
Progress Note    [name]  NWG:[id number] DOB: 12/28/1945  DOA: 03/22/2023 PCP: Lauro Regulus, MD      Brief Narrative:    Medical records reviewed and are as summarized below:  [name] is a 77 y.o. female with medical history significant of morbid obesity, chronic diastolic CHF, stage III CKD, hypertension, hypothyroidism, depression, and cognitive impairment.  She initially presented to the ED on 03/20/2023 after mechanical fall at home on level ground.  She had complaints of left ankle joint pain and right knee joint pain.  She was found to have minimally displaced right fibula fracture.  Her right foot was placed in the splint and she was discharged home with plans for outpatient follow-up with orthopedic surgeon.   However, she did not do well at home.  She has been unable to walk using her rolling walker.  She has not had any help at home and remained in her recliner.  She says she was peeing on herself because of inability to ambulate.  She presented to the ED again on 03/22/2023.       Assessment/Plan:   Principal Problem:   Closed fracture of distal end of right fibula with routine healing Active Problems:   Fibula fracture   Contusion of left foot    Body mass index is 40.56 kg/m.  (Morbid obesity).  This complicates overall care and prognosis   Subacute closed minimally displaced right distal fibula fracture: Cam boot was applied to right leg on 03/27/2023.  Tylenol as needed for pain.  She does not want any NSAIDs at this time.  She also says she is allergic to a lot of opioids.   Probable acute UTI: Urinalysis from 03/22/2023 showed moderate leukocytes, positive nitrite, rare bacteria and wbc 21-50.  Urine culture showed 30,000 colonies per mL multiple species.   Completed 3 days of IV ceftriaxone on 03/26/2023. Of note patient also has underlying stress incontinence.   Generalized weakness: PT and OT  recommended discharge to SNF. Patient has run out of rehab days and therefore does not have insurance approval to go to SNF at this time.  She does not qualify for Medicaid.  DSS is on the case.  No safe discharge plan at this time.    Chronic diastolic CHF: CHF appears compensated.  Continue torsemide and Aldactone.     Other comorbidities include hypertension, hypothyroidism, anxiety, dementia   Diet Order             Diet Heart Room service appropriate? Yes; Fluid consistency: Thin  Diet effective now                            Consultants: Podiatrist  Procedures: None    Medications:    amLODipine  5 mg Oral Daily   vitamin C  1,000 mg Oral Daily   carvedilol  50 mg Oral BID WC   cholecalciferol  2,000 Units Oral Daily   donepezil  10 mg Oral Daily   folic acid  500 mcg Oral Daily   heparin  5,000 Units Subcutaneous Q8H   levothyroxine  112 mcg Oral Q0600   losartan  100 mg Oral Daily   melatonin  5 mg Oral QHS   montelukast  10 mg Oral QPM   pantoprazole  40 mg Oral Daily   spironolactone  25 mg Oral Daily   topiramate  25 mg Oral Daily  torsemide  10 mg Oral Daily   traZODone  50 mg Oral QHS   venlafaxine XR  150 mg Oral BID   Continuous Infusions:     Anti-infectives (From admission, onward)    Start     Dose/Rate Route Frequency Ordered Stop   03/24/23 2045  cefTRIAXone (ROCEPHIN) 1 g in sodium chloride 0.9 % 100 mL IVPB        1 g 200 mL/hr over 30 Minutes Intravenous Every 24 hours 03/24/23 1945 03/27/23 0708              Family Communication/Anticipated D/C date and plan/Code Status   DVT prophylaxis: heparin injection 5,000 Units Start: 03/22/23 2200     Code Status: Full Code  Family Communication: None Disposition Plan: Plan to discharge to SNF   Status is: Inpatient Remains inpatient appropriate because: Awaiting placement to SNF       Subjective:   Interval events noted.  She complains of pain in  the right foot.   Objective:    Vitals:   03/27/23 1457 03/27/23 2306 03/27/23 2310 03/28/23 0733  BP: (!) 121/56 (!) 104/49 (!) 106/58 126/60  Pulse: 75 77  71  Resp: 18   18  Temp: 98.7 F (37.1 C) 98.3 F (36.8 C)  97.8 F (36.6 C)  TempSrc: Oral Oral    SpO2: 95% 97%  98%  Weight:      Height:       No data found.   Intake/Output Summary (Last 24 hours) at 03/28/2023 1250 Last data filed at 03/28/2023 0949 Gross per 24 hour  Intake 920 ml  Output --  Net 920 ml   Filed Weights   03/22/23 1147 03/24/23 0500  Weight: 112.9 kg 114 kg    Exam:  GEN: NAD SKIN: Warm and dry EYES: No pallor or icterus ENT: MMM CV: RRR PULM: CTA B ABD: soft, obese, NT, +BS CNS: AAO x 3, non focal EXT: Cam boot on right leg      Data Reviewed:   I have personally reviewed following labs and imaging studies:  Labs: Labs show the following:   Basic Metabolic Panel: Recent Labs  Lab 03/22/23 1149 03/23/23 0400 03/24/23 0644  NA 138 136 135  K 4.4 4.4 4.0  CL 108 110 104  CO2 22 21* 22  GLUCOSE 97 100* 104*  BUN 20 18 23   CREATININE 0.94 0.95 1.04*  CALCIUM 9.1 9.2 9.3   GFR Estimated Creatinine Clearance: 58.1 mL/min (A) (by C-G formula based on SCr of 1.04 mg/dL (H)). Liver Function Tests: No results for input(s): "AST", "ALT", "ALKPHOS", "BILITOT", "PROT", "ALBUMIN" in the last 168 hours. No results for input(s): "LIPASE", "AMYLASE" in the last 168 hours. No results for input(s): "AMMONIA" in the last 168 hours. Coagulation profile No results for input(s): "INR", "PROTIME" in the last 168 hours.  CBC: Recent Labs  Lab 03/22/23 1149 03/23/23 0400 03/24/23 0644  WBC 10.7* 8.9 7.4  NEUTROABS 7.8*  --   --   HGB 10.6* 10.2* 10.7*  HCT 31.4* 30.5* 31.6*  MCV 96.9 97.8 96.6  PLT 224 201 201   Cardiac Enzymes: Recent Labs  Lab 03/22/23 1149  CKTOTAL 39   BNP (last 3 results) No results for input(s): "PROBNP" in the last 8760 hours. CBG: No  results for input(s): "GLUCAP" in the last 168 hours. D-Dimer: No results for input(s): "DDIMER" in the last 72 hours. Hgb A1c: No results for input(s): "HGBA1C" in the last 72  hours. Lipid Profile: No results for input(s): "CHOL", "HDL", "LDLCALC", "TRIG", "CHOLHDL", "LDLDIRECT" in the last 72 hours. Thyroid function studies: No results for input(s): "TSH", "T4TOTAL", "T3FREE", "THYROIDAB" in the last 72 hours.  Invalid input(s): "FREET3" Anemia work up: No results for input(s): "VITAMINB12", "FOLATE", "FERRITIN", "TIBC", "IRON", "RETICCTPCT" in the last 72 hours. Sepsis Labs: Recent Labs  Lab 03/22/23 1149 03/23/23 0400 03/24/23 0644  WBC 10.7* 8.9 7.4    Microbiology Recent Results (from the past 240 hour(s))  Urine Culture (for pregnant, neutropenic or urologic patients or patients with an indwelling urinary catheter)     Status: Abnormal   Collection Time: 03/25/23  7:06 AM   Specimen: Urine, Clean Catch  Result Value Ref Range Status   Specimen Description   Final    URINE, CLEAN CATCH Performed at [facility], [address] [place], [place] [address]    Special Requests   Final    NONE Performed at [facility], [address] [place], [place] [address]    Culture (A)  Final    30,000 COLONIES/mL MULTIPLE SPECIES PRESENT, SUGGEST RECOLLECTION   Report Status 03/26/2023 FINAL  Final    Procedures and diagnostic studies:  DG Foot Complete Left  Result Date: 03/27/2023 CLINICAL DATA:  Chronic left foot pain. EXAM: LEFT FOOT - COMPLETE 3+ VIEW COMPARISON:  None Available. FINDINGS: No acute fracture or dislocation. Healed calcaneal osteotomy status post single screw fixation. No evidence of hardware failure or loosening. Mild degenerative changes of the midfoot and IP joints. Osteopenia. Soft tissues are unremarkable. IMPRESSION: 1.  No acute osseous abnormality.  Mild osteoarthritis. Electronically Signed   By: Obie Dredge M.D.   On:  03/27/2023 14:45               LOS: 5 days   Syretta Kochel  [facility]   Pager on [email or web address]. If 7PM-7AM, please contact night-coverage at [email or web address]     03/28/2023, 12:50 PM

## 2023-03-28 NOTE — Plan of Care (Signed)

## 2023-03-29 DIAGNOSIS — S82831D Other fracture of upper and lower end of right fibula, subsequent encounter for closed fracture with routine healing: Secondary | ICD-10-CM | POA: Diagnosis not present

## 2023-03-29 LAB — BASIC METABOLIC PANEL
Anion gap: 7 (ref 5–15)
BUN: 58 mg/dL — ABNORMAL HIGH (ref 8–23)
CO2: 24 mmol/L (ref 22–32)
Calcium: 9.7 mg/dL (ref 8.9–10.3)
Chloride: 102 mmol/L (ref 98–111)
Creatinine, Ser: 1.35 mg/dL — ABNORMAL HIGH (ref 0.44–1.00)
GFR, Estimated: 40 mL/min — ABNORMAL LOW (ref >60)
Glucose, Bld: 117 mg/dL — ABNORMAL HIGH (ref 70–99)
Potassium: 4.6 mmol/L (ref 3.5–5.1)
Sodium: 133 mmol/L — ABNORMAL LOW (ref 135–145)

## 2023-03-29 NOTE — Progress Notes (Signed)
Occupational Therapy Treatment Patient Details Name: RHYEN MCMICKENS [name] MRN: [id number] DOB: 1945/08/11 Today's Date: 03/29/2023   History of present illness Pt. is a 77yoF who [facility] after a fall and collapse onto BR floor at home, DC back to home from ED same day. Pt comes back 03/22/23 with ongoing remarkable weakness, inability to walk. Per EMS pt down on floor for 48 hours. PMH: HTN, CHF, CKD, hypoTSH. Imaging revealing of Rt distal fibular fracture. Podiatry consulting on 10/7, recommends conservative management in splint, NWB.   OT comments  Upon entering the room, pt seated in recliner chair and agreeable to OT intervention. Pt brushing teeth, combing hair, and applying makeup while seated with set up A. She has recently been assisted to recliner chair with +2 assistance. OT spoke to NT about plan for hoyer lift back to bed for safety when pt is ready. Pt able to perform anterior weight shift and lateral leans with min - mod A for [name] to place sling under her while she sat up in chair. OT also reviewing B UE exercises of chest presses, bicep curls, and shoulder elevation with use of towel pulled tight for resistance. Pt remains in recliner chair with call bell and all needed items within reach upon exiting the room.       If plan is discharge home, recommend the following:  A lot of help with bathing/dressing/bathroom;A lot of help with walking and/or transfers;Two people to help with walking and/or transfers;Assist for transportation;Assistance with cooking/housework   Equipment Recommendations  Other (comment) (defer to next venue of care)       Precautions / Restrictions Precautions Precautions: Fall Required Braces or Orthoses: Other Brace Other Brace: CAM boot Restrictions Weight Bearing Restrictions: Yes RLE Weight Bearing: Non weight bearing       Mobility Bed Mobility               General bed mobility comments: seated in recliner  chair    Transfers                             ADL either performed or assessed with clinical judgement   ADL Overall ADL's : Needs assistance/impaired     Grooming: Sitting;Set up;Supervision/safety                                       Vision Patient Visual Report: No change from baseline            Cognition Arousal: Alert Behavior During Therapy: WFL for tasks assessed/performed Overall Cognitive Status: Within Functional Limits for tasks assessed                                 General Comments: pleasant throughout session                   Pertinent Vitals/ Pain       Pain Assessment Pain Assessment: 0-10 Pain Score: 5  Pain Location: RIght lower leg Pain Descriptors / Indicators: Sore, Aching, Discomfort Pain Intervention(s): Limited activity within patient's tolerance, Monitored during session, Repositioned         Frequency  Min 1X/week        Progress Toward Goals  OT Goals(current goals can now be found in the care  plan section)  Progress towards OT goals: Progressing toward goals      AM-PAC OT "6 Clicks" Daily Activity     Outcome Measure   Help from another person eating meals?: None Help from another person taking care of personal grooming?: A Little Help from another person toileting, which includes using toliet, bedpan, or urinal?: Total Help from another person bathing (including washing, rinsing, drying)?: A Lot Help from another person to put on and taking off regular upper body clothing?: A Lot Help from another person to put on and taking off regular lower body clothing?: Total 6 Click Score: 13    End of Session    OT Visit Diagnosis: Unsteadiness on feet (R26.81);Muscle weakness (generalized) (M62.81);History of falling (Z91.81);Other abnormalities of gait and mobility (R26.89)   Activity Tolerance Patient tolerated treatment well   Patient Left in chair;with call bell/phone  within reach;with chair alarm set   Nurse Communication Mobility status;Other (comment) (OT placed sling under pt)        Time: 2440-1027 OT Time Calculation (min): 29 min  Charges: OT General Charges $OT Visit: 1 Visit OT Treatments $Self Care/Home Management : 8-22 mins $Therapeutic Activity: 8-22 mins  Jackquline Denmark, MS, OTR/L , CBIS ascom [phone]  03/29/23, 1:27 PM

## 2023-03-29 NOTE — Discharge Summary (Addendum)
Physician Discharge Summary   Patient: [name] MRN: [id number] DOB: June 13, 1946  Admit date:     03/22/2023  Discharge date: 03/29/23  Discharge Physician: Lurene Shadow   PCP: Lauro Regulus, MD   Recommendations at discharge:   Follow-up with physician at the nursing home within 3 days of discharge Repeat BMP within 1 week of discharge. Follow up[ with podiatrist (Dr. Jamse Arn or Dr. Ardelle Anton) within 1 month of discharge.   Discharge Diagnoses: Principal Problem:   Closed fracture of distal end of right fibula with routine healing Active Problems:   Fibula fracture   Contusion of left foot  Resolved Problems:   * No resolved hospital problems. *  Hospital Course:  [name] is a 77 y.o. female with medical history significant of morbid obesity, chronic diastolic CHF, stage III CKD, hypertension, hypothyroidism, depression, and cognitive impairment.  She initially presented to the ED on 03/20/2023 after mechanical fall at home on level ground.  She had complaints of left ankle joint pain and right knee joint pain.  She was found to have minimally displaced right fibula fracture.  Her right foot was placed in the splint and she was discharged home with plans for outpatient follow-up with orthopedic surgeon.     However, she did not do well at home.  She has been unable to walk using her rolling walker.  She has not had any help at home and remained in her recliner.  She says she was peeing on herself because of inability to ambulate.  She presented to the ED again on 03/22/2023.      Assessment and Plan:   Subacute closed minimally displaced right distal fibula fracture: Cam boot was applied to right leg on 03/27/2023.  Tylenol as needed for pain.  She does not want any NSAIDs at this time.  She also says she is allergic to a lot of opioids.     Probable acute UTI: Urinalysis from 03/22/2023 showed moderate leukocytes, positive nitrite,  rare bacteria and wbc 21-50.  Urine culture showed 30,000 colonies per mL multiple species.   Completed 3 days of IV ceftriaxone on 03/26/2023. Of note patient also has underlying stress incontinence.     Generalized weakness: PT and OT recommended discharge to SNF.     Chronic diastolic CHF: CHF appears compensated.  Continue torsemide and Aldactone.     CKD stage 3: Probably has CKD stage 3a. Monitor creatinine while on diuretics.   Other comorbidities include hypertension, hypothyroidism, anxiety, dementia    Her condition has improved and she is deemed stable for discharge to SNF today. Discharge plan discussed with patient and she agreed with the plan. She said she will be sharing the same room with her husband and she's happy about that.       Consultants: Podiatrist Procedures performed: None  Disposition: Skilled nursing facility Diet recommendation:  Cardiac diet DISCHARGE MEDICATION: Allergies as of 03/29/2023       Reactions   Codeine Other (See Comments)   Seizure. Tolerates tramadol   Povidone Iodine Shortness Of Breath   Patient had difficulty breathing during procedure where dura prep was used to clean back.    Hydromorphone Hives, Itching, Other (See Comments)   Severe itching hallucinations   Latex Itching, Other (See Comments)   blisters   Oxycodone-acetaminophen Nausea And Vomiting, Itching   Sulfa Antibiotics Itching, Swelling   Swelling eyes, hands, feet. Was at home and didn't go to the hospital  Bupropion Other (See Comments)   Gabapentin Other (See Comments)   tremors   Hydrocodone    Tolerates tramadol   Morphine And Codeine Itching, Rash   Procaine         Medication List     STOP taking these medications    Biotin 10 MG Caps   fluticasone furoate-vilanterol 100-25 MCG/INH Aepb Commonly known as: BREO ELLIPTA   magnesium 30 MG tablet   predniSONE 10 MG (21) Tbpk tablet Commonly known as: STERAPRED UNI-PAK 21 TAB   vitamin  C 1000 MG tablet   zinc gluconate 50 MG tablet       TAKE these medications    acetaminophen 500 MG tablet Commonly known as: TYLENOL Take 500 mg by mouth every 6 (six) hours as needed.   Amantadine HCl 100 MG tablet Take 100 mg by mouth 2 (two) times daily. What changed: Another medication with the same name was removed. Continue taking this medication, and follow the directions you see here.   amLODipine 5 MG tablet Commonly known as: NORVASC Take 5 mg by mouth daily.   baclofen 20 MG tablet Commonly known as: LIORESAL Take 20 mg by mouth daily as needed for muscle spasms.   carvedilol 25 MG tablet Commonly known as: COREG Take 50 mg by mouth 2 (two) times daily with a meal.   donepezil 10 MG tablet Commonly known as: ARICEPT Take 10 mg by mouth daily.   esomeprazole 40 MG capsule Commonly known as: NEXIUM Take 40 mg by mouth daily.   fluticasone 50 MCG/ACT nasal spray Commonly known as: FLONASE Place 1 spray into both nostrils daily as needed for rhinitis or allergies.   folic acid 400 MCG tablet Commonly known as: FOLVITE Take 400 mcg by mouth daily.   levothyroxine 112 MCG tablet Commonly known as: SYNTHROID Take 112 mcg by mouth daily.   losartan 100 MG tablet Commonly known as: COZAAR Take 100 mg by mouth daily.   melatonin 3 MG Tabs tablet Take 1 tablet at bedtime by mouth.   montelukast 10 MG tablet Commonly known as: SINGULAIR Take 10 mg by mouth every evening.   multivitamin tablet Take 1 tablet by mouth daily.   nystatin powder Commonly known as: MYCOSTATIN/NYSTOP Apply topically 2 (two) times daily. What changed:  how much to take when to take this reasons to take this   spironolactone 25 MG tablet Commonly known as: ALDACTONE Take 25 mg by mouth daily.   topiramate 25 MG tablet Commonly known as: TOPAMAX Take 25 mg by mouth daily.   torsemide 20 MG tablet Commonly known as: DEMADEX Take 20 mg by mouth daily. Takes 10mg   unless ankle is swelling, then takes 20mg    traZODone 50 MG tablet Commonly known as: DESYREL Take 50 mg by mouth at bedtime. 50-150mg  nightly as needed   venlafaxine XR 75 MG 24 hr capsule Commonly known as: EFFEXOR-XR Take 150 mg by mouth 2 (two) times daily.   Vitamin D3 50 MCG (2000 UT) Tabs Take 2,000 Units daily by mouth.        Discharge Exam: Filed Weights   03/22/23 1147 03/24/23 0500 03/29/23 0500  Weight: 112.9 kg 114 kg 103.4 kg   GEN: NAD SKIN: Warm and dry EYES: No pallor or icterus ENT: MMM CV: RRR PULM: CTA B ABD: soft, obese, NT, +BS CNS: AAO x 3, non focal EXT: Mild left leg edema.  Cam boot on right leg.   Condition at discharge: good  The results  of significant diagnostics from this hospitalization (including imaging, microbiology, ancillary and laboratory) are listed below for reference.   Imaging Studies: DG Foot Complete Left  Result Date: 03/27/2023 CLINICAL DATA:  Chronic left foot pain. EXAM: LEFT FOOT - COMPLETE 3+ VIEW COMPARISON:  None Available. FINDINGS: No acute fracture or dislocation. Healed calcaneal osteotomy status post single screw fixation. No evidence of hardware failure or loosening. Mild degenerative changes of the midfoot and IP joints. Osteopenia. Soft tissues are unremarkable. IMPRESSION: 1.  No acute osseous abnormality.  Mild osteoarthritis. Electronically Signed   By: Obie Dredge M.D.   On: 03/27/2023 14:45   DG Ankle Complete Right  Result Date: 03/22/2023 CLINICAL DATA:  Distal fibula fracture, pain EXAM: RIGHT ANKLE - COMPLETE 3+ VIEW COMPARISON:  03/20/2023 FINDINGS: Unchanged appearance of a minimally displaced oblique fracture of the distal right fibula with mild widening of the ankle mortise status post cast application. Diffuse soft tissue edema about the ankle. No new fracture. IMPRESSION: Unchanged appearance of a minimally displaced oblique fracture of the distal right fibula with mild widening of the ankle  mortise status post cast application. Diffuse soft tissue edema about the ankle. No new fracture. Electronically Signed   By: Jearld Lesch M.D.   On: 03/22/2023 15:28   DG Ankle Complete Right  Result Date: 03/20/2023 CLINICAL DATA:  Fall EXAM: RIGHT ANKLE - COMPLETE 3+ VIEW COMPARISON:  None Available. FINDINGS: There is a minimally displaced fracture of the distal right fibula. No tibial fracture. Ankle mortise remains approximated. IMPRESSION: Minimally displaced fracture of the distal right fibula. Electronically Signed   By: Deatra Robinson M.D.   On: 03/20/2023 04:04   DG Knee Complete 4 Views Right  Result Date: 03/20/2023 CLINICAL DATA:  Fall EXAM: RIGHT KNEE - COMPLETE 4+ VIEW COMPARISON:  None Available. FINDINGS: Total knee arthroplasty without adverse features. No acute fracture. No joint effusion. IMPRESSION: No acute abnormality of the right knee. Electronically Signed   By: Deatra Robinson M.D.   On: 03/20/2023 04:03   DG Tibia/Fibula Left  Result Date: 03/20/2023 CLINICAL DATA:  Fall EXAM: LEFT TIBIA AND FIBULA - 2 VIEW COMPARISON:  None Available. FINDINGS: Total knee arthroplasty and calcaneal lag screw without adverse features. No acute fracture. IMPRESSION: No acute abnormality of the left tibia/fibula. Electronically Signed   By: Deatra Robinson M.D.   On: 03/20/2023 04:02    Microbiology: Results for orders placed or performed during the hospital encounter of 03/22/23  Urine Culture (for pregnant, neutropenic or urologic patients or patients with an indwelling urinary catheter)     Status: Abnormal   Collection Time: 03/25/23  7:06 AM   Specimen: Urine, Clean Catch  Result Value Ref Range Status   Specimen Description   Final    URINE, CLEAN CATCH Performed at [facility], [address] [place], [place] [address]    Special Requests   Final    NONE Performed at [facility], [address] [place], [place] [address]    Culture (A)  Final    30,000  COLONIES/mL MULTIPLE SPECIES PRESENT, SUGGEST RECOLLECTION   Report Status 03/26/2023 FINAL  Final    Labs: CBC: Recent Labs  Lab 03/23/23 0400 03/24/23 0644  WBC 8.9 7.4  HGB 10.2* 10.7*  HCT 30.5* 31.6*  MCV 97.8 96.6  PLT 201 201   Basic Metabolic Panel: Recent Labs  Lab 03/23/23 0400 03/24/23 0644 03/29/23 0445  NA 136 135 133*  K 4.4 4.0 4.6  CL 110 104 102  CO2 21* 22 24  GLUCOSE 100* 104* 117*  BUN 18 23 58*  CREATININE 0.95 1.04* 1.35*  CALCIUM 9.2 9.3 9.7   Liver Function Tests: No results for input(s): "AST", "ALT", "ALKPHOS", "BILITOT", "PROT", "ALBUMIN" in the last 168 hours. CBG: No results for input(s): "GLUCAP" in the last 168 hours.  Discharge time spent: greater than 30 minutes.  Signed: Lurene Shadow, MD [facility] 03/29/2023

## 2023-03-29 NOTE — TOC Transition Note (Addendum)
Transition of Care [facility]) - CM/SW Discharge Note   Patient Details  Name: [name] MRN: [id number] Date of Birth: [birth year]  [facility]) CM/SW Contact:  [name], RN Phone Number: 03/29/2023, 2:29 PM   Clinical Narrative:     Called EMS to transport patient to room 503A at [facility] rehab There are several ahead of her on the transport list  Final next level of care:  (TBD) Barriers to Discharge: Inadequate or no insurance (no remaining bed days)   Patient Goals and CMS Choice      Discharge Placement                         Discharge Plan and Services Additional resources added to the After Visit Summary for   In-house Referral: NA Discharge Planning Services: CM Consult                                 Social Determinants of Health (SDOH) Interventions SDOH Screenings   Food Insecurity: No Food Insecurity (03/23/2023)  Housing: Low Risk  (03/23/2023)  Transportation Needs: No Transportation Needs (03/23/2023)  Utilities: Not At Risk (03/23/2023)  Depression (PHQ2-9): Low Risk  (11/11/2022)  Social Connections: Unknown (10/26/2021)   Received from [facility]  Tobacco Use: Low Risk  (03/23/2023)     Readmission Risk Interventions     No data to display

## 2023-03-29 NOTE — TOC Progression Note (Signed)
Transition of Care [facility]) - Progression Note    Patient Details  Name: [name] MRN: [id number] Date of Birth: [birth year]  Transition of Care [facility]) CM/SW Contact  [name], RN Phone Number: 03/29/2023, 12:56 PM  Clinical Narrative:     [name] notified me that they will accept the patient and help her get long term medicaid  Expected Discharge Plan: Skilled Nursing Facility Barriers to Discharge: Inadequate or no insurance (no remaining bed days)  Expected Discharge Plan and Services In-house Referral: NA Discharge Planning Services: CM Consult   Living arrangements for the past 2 months: Single Family Home                                       Social Determinants of Health (SDOH) Interventions SDOH Screenings   Food Insecurity: No Food Insecurity (03/23/2023)  Housing: Low Risk  (03/23/2023)  Transportation Needs: No Transportation Needs (03/23/2023)  Utilities: Not At Risk (03/23/2023)  Depression (PHQ2-9): Low Risk  (11/11/2022)  Social Connections: Unknown (10/26/2021)   Received from [facility], [facility]  Tobacco Use: Low Risk  (03/23/2023)    Readmission Risk Interventions     No data to display

## 2023-03-29 NOTE — Plan of Care (Signed)

## 2023-03-29 NOTE — Progress Notes (Signed)
Called report to [facility] LPN at [facility] rehab. Pt to go to room 106A.  Questions asked and answered. Paperwork on the chart for EMS. CN and TOC aware.

## 2023-03-29 NOTE — Plan of Care (Signed)
  Problem: Acute Rehab OT Goals (only OT should resolve) Goal: Pt. Will Perform Grooming Outcome: Adequate for Discharge Goal: Pt. Will Perform Lower Body Dressing Outcome: Adequate for Discharge Goal: Pt. Will Transfer To Toilet Outcome: Adequate for Discharge Goal: Pt. Will Perform Toileting-Clothing Manipulation Outcome: Adequate for Discharge   Problem: Acute Rehab PT Goals(only PT should resolve) Goal: Patient Will Transfer Sit To/From Stand Outcome: Adequate for Discharge Goal: Pt Will Ambulate Outcome: Adequate for Discharge Goal: Pt Will Go Up/Down Stairs Outcome: Adequate for Discharge

## 2023-03-29 NOTE — Discharge Instructions (Signed)
Follow up with PCP and pick up medications from pharmacy. Make sure to take all antibiotics. Do not double up on narcotic/pain medication or drink alcohol during this time. Do not drive while taking narcotic medication. Call 911 or return to ER for life threatening issues or other concerns.

## 2023-03-29 NOTE — Progress Notes (Signed)
Physical Therapy Treatment Patient Details Name: [name] [name] MRN: [id number] DOB: [birth year] Today's Date: 03/29/2023   History of Present Illness Pt. is a 77yoF who comes to [facility] on 03/20/23 after a fall and collapse onto BR floor at home, DC back to home from ED same day. Pt comes back 03/22/23 with ongoing remarkable weakness, inability to walk. Per EMS pt down on floor for 48 hours. PMH: HTN, CHF, CKD, hypoTSH. Imaging revealing of Rt distal fibular fracture. Podiatry consulting on 10/7, recommends conservative management in splint, NWB.    PT Comments  Patient alert, agreeable to PT reported 6/10 RLE pain. Pt/Pt reviewed potential transfer techniques and options via demonstration. The patient was minA to come up into sitting EOB with use of bed rails. Attempted lateral scoot to L but pt unable to truly scoot due to NWB on RLE and BUE weakness. MaxAx2 to squat pivot to recliner in room. The patient would benefit from further skilled PT intervention to continue to progress towards goals.   If plan is discharge home, recommend the following: Two people to help with walking and/or transfers;Two people to help with bathing/dressing/bathroom;Assistance with cooking/housework;Assist for transportation;Help with stairs or ramp for entrance   Can travel by private vehicle     No  Equipment Recommendations  None recommended by PT    Recommendations for Other Services       Precautions / Restrictions Precautions Precautions: Fall Required Braces or Orthoses: Other Brace (cam boot) Restrictions Weight Bearing Restrictions: Yes RLE Weight Bearing: Non weight bearing     Mobility  Bed Mobility Overal bed mobility: Needs Assistance Bed Mobility: Supine to Sit     Supine to sit: Min assist, HOB elevated          Transfers Overall transfer level: Needs assistance Equipment used: None Transfers: Bed to chair/wheelchair/BSC Sit to Stand: Max assist, +2 physical  assistance, From elevated surface     Squat pivot transfers: Max assist, +2 physical assistance     General transfer comment: attempted lateral scoot but pt unable to perform due to RLE NWB, BUE weakness    Ambulation/Gait                   Stairs             Wheelchair Mobility     Tilt Bed    Modified Rankin (Stroke Patients Only)       Balance Overall balance assessment: Needs assistance Sitting-balance support: Feet supported Sitting balance-Leahy Scale: Good     Standing balance support: Bilateral upper extremity supported, During functional activity, Reliant on assistive device for balance Standing balance-Leahy Scale: Zero                              Cognition Arousal: Alert Behavior During Therapy: WFL for tasks assessed/performed Overall Cognitive Status: Within Functional Limits for tasks assessed                                          Exercises      General Comments        Pertinent Vitals/Pain Pain Assessment Pain Assessment: 0-10 Pain Score: 6  Pain Descriptors / Indicators: Sore, Aching Pain Intervention(s): Limited activity within patient's tolerance, Monitored during session, Repositioned    Home Living  Prior Function            PT Goals (current goals can now be found in the care plan section) Progress towards PT goals: Not progressing toward goals - comment    Frequency    Min 1X/week      PT Plan      Co-evaluation              AM-PAC PT "6 Clicks" Mobility   Outcome Measure  Help needed turning from your back to your side while in a flat bed without using bedrails?: A Lot Help needed moving from lying on your back to sitting on the side of a flat bed without using bedrails?: A Lot Help needed moving to and from a bed to a chair (including a wheelchair)?: Total Help needed standing up from a chair using your arms (e.g., wheelchair  or bedside chair)?: Total Help needed to walk in hospital room?: Total Help needed climbing 3-5 steps with a railing? : Total 6 Click Score: 8    End of Session Equipment Utilized During Treatment: Gait belt Activity Tolerance: Patient tolerated treatment well Patient left: in chair;with call bell/phone within reach;with chair alarm set Nurse Communication: Mobility status PT Visit Diagnosis: Difficulty in walking, not elsewhere classified (R26.2);Other abnormalities of gait and mobility (R26.89);Muscle weakness (generalized) (M62.81)     Time: 2130-8657 PT Time Calculation (min) (ACUTE ONLY): 26 min  Charges:    $Therapeutic Activity: 23-37 mins PT General Charges $$ ACUTE PT VISIT: 1 Visit                     Olga Coaster PT, DPT 11:37 AM,03/29/23

## 2023-03-29 NOTE — Progress Notes (Signed)
Nsg Discharge Note  Admit Date:  03/22/2023 Discharge date: 03/29/2023   [name] to be D/C'd Skilled nursing facility [facility] per MD order.  AVS completed.  Copy for chart, and copy for patient signed, and dated. Patient/caregiver able to verbalize understanding.  Discharge Medication: Allergies as of 03/29/2023       Reactions   Codeine Other (See Comments)   Seizure. Tolerates tramadol   Povidone Iodine Shortness Of Breath   Patient had difficulty breathing during procedure where dura prep was used to clean back.    Hydromorphone Hives, Itching, Other (See Comments)   Severe itching hallucinations   Latex Itching, Other (See Comments)   blisters   Oxycodone-acetaminophen Nausea And Vomiting, Itching   Sulfa Antibiotics Itching, Swelling   Swelling eyes, hands, feet. Was at home and didn't go to the hospital   Bupropion Other (See Comments)   Gabapentin Other (See Comments)   tremors   Hydrocodone    Tolerates tramadol   Morphine And Codeine Itching, Rash   Procaine         Medication List     STOP taking these medications    Biotin 10 MG Caps   fluticasone furoate-vilanterol 100-25 MCG/INH Aepb Commonly known as: BREO ELLIPTA   magnesium 30 MG tablet   predniSONE 10 MG (21) Tbpk tablet Commonly known as: STERAPRED UNI-PAK 21 TAB   vitamin C 1000 MG tablet   zinc gluconate 50 MG tablet       TAKE these medications    acetaminophen 500 MG tablet Commonly known as: TYLENOL Take 500 mg by mouth every 6 (six) hours as needed.   Amantadine HCl 100 MG tablet Take 100 mg by mouth 2 (two) times daily. What changed: Another medication with the same name was removed. Continue taking this medication, and follow the directions you see here.   amLODipine 5 MG tablet Commonly known as: NORVASC Take 5 mg by mouth daily.   baclofen 20 MG tablet Commonly known as: LIORESAL Take 20 mg by mouth daily as needed for muscle spasms.    carvedilol 25 MG tablet Commonly known as: COREG Take 50 mg by mouth 2 (two) times daily with a meal.   donepezil 10 MG tablet Commonly known as: ARICEPT Take 10 mg by mouth daily.   esomeprazole 40 MG capsule Commonly known as: NEXIUM Take 40 mg by mouth daily.   fluticasone 50 MCG/ACT nasal spray Commonly known as: FLONASE Place 1 spray into both nostrils daily as needed for rhinitis or allergies.   folic acid 400 MCG tablet Commonly known as: FOLVITE Take 400 mcg by mouth daily.   levothyroxine 112 MCG tablet Commonly known as: SYNTHROID Take 112 mcg by mouth daily.   losartan 100 MG tablet Commonly known as: COZAAR Take 100 mg by mouth daily.   melatonin 3 MG Tabs tablet Take 1 tablet at bedtime by mouth.   montelukast 10 MG tablet Commonly known as: SINGULAIR Take 10 mg by mouth every evening.   multivitamin tablet Take 1 tablet by mouth daily.   nystatin powder Commonly known as: MYCOSTATIN/NYSTOP Apply topically 2 (two) times daily. What changed:  how much to take when to take this reasons to take this   spironolactone 25 MG tablet Commonly known as: ALDACTONE Take 25 mg by mouth daily.   topiramate 25 MG tablet Commonly known as: TOPAMAX Take 25 mg by mouth daily.   torsemide 20 MG tablet Commonly known as: DEMADEX Take 20 mg  by mouth daily. Takes 10mg  unless ankle is swelling, then takes 20mg    traZODone 50 MG tablet Commonly known as: DESYREL Take 50 mg by mouth at bedtime. 50-150mg  nightly as needed   venlafaxine XR 75 MG 24 hr capsule Commonly known as: EFFEXOR-XR Take 150 mg by mouth 2 (two) times daily.   Vitamin D3 50 MCG (2000 UT) Tabs Take 2,000 Units daily by mouth.        Discharge Assessment: Vitals:   03/29/23 0737 03/29/23 1438  BP: 136/60 (!) 131/46  Pulse: 80 72  Resp: 18 16  Temp: 97.9 F (36.6 C) 98.2 F (36.8 C)  SpO2: 100% 100%   Skin clean, dry and intact without evidence of skin break down, no evidence  of skin tears noted. IV catheter discontinued intact. Site without signs and symptoms of complications - no redness or edema noted at insertion site, patient denies c/o pain - only slight tenderness at site.  Dressing with slight pressure applied.  D/c Instructions-Education: Discharge instructions given to patient/family with verbalized understanding. D/c education completed with patient/family including follow up instructions, medication list, d/c activities limitations if indicated, with other d/c instructions as indicated by MD - patient able to verbalize understanding, all questions fully answered. Patient instructed to return to ED, call 911, or call MD for any changes in condition.  Patient escorted via WC, and D/C home via private auto.  Adair Laundry, RN 03/29/2023 3:25 PM

## 2023-04-29 ENCOUNTER — Ambulatory Visit: Payer: Medicare Other | Admitting: Podiatry

## 2023-05-06 ENCOUNTER — Ambulatory Visit: Payer: Medicare Other

## 2023-05-06 ENCOUNTER — Ambulatory Visit: Payer: Medicare Other | Admitting: Podiatry

## 2023-05-06 ENCOUNTER — Encounter: Payer: Self-pay | Admitting: Podiatry

## 2023-05-06 DIAGNOSIS — S82891D Other fracture of right lower leg, subsequent encounter for closed fracture with routine healing: Secondary | ICD-10-CM | POA: Diagnosis not present

## 2023-05-06 NOTE — Patient Instructions (Addendum)
May Weight Bear as tolerated in CAM walker boot. May need to start with walker, may progress as appropriate per PT. Ok to remove boot to sleep at this point. Follow up in 4 weeks for follow up xrays.

## 2023-05-07 NOTE — Progress Notes (Signed)
Chief Complaint  Patient presents with   Ankle Problem    03/22/23 - Had a fall at home -  [facility] came to get her and she fell and she sustained an ankle fracture-  she went to [facility] and she had a cast and then a boot.      HPI: 77 y.o. female presents today as hospital follow-up from [facility] for right fibular fracture sustained on 03/22/2023.  She has been compliant with cam walker boot.  She has been nonweightbearing and presents using a wheelchair.  She is still in rehab at this point.  She is nearly 6 weeks out from the original injury.  Past Medical History:  Diagnosis Date   Benign positional vertigo 03/04/2016   Brain injury [facility])    CHF (congestive heart failure) (HCC)    Climacteric    Depression    DJD (degenerative joint disease)    lumbar and cervical   Dyslipidemia    GERD (gastroesophageal reflux disease)    Hypertension    Hypokalemia    Hypothyroidism    IBS (irritable bowel syndrome)    OAB (overactive bladder)    Ovarian cyst 03/25/2015   3cm simple cyst on right ovary   Rhinitis    Sleep apnea    Spasm of thoracolumbar muscle     Past Surgical History:  Procedure Laterality Date   ABDOMINAL HYSTERECTOMY  07/12/2014   ANKLE RECONSTRUCTION Left    BRACHIOPLASTY     BREAST REDUCTION SURGERY Bilateral 2020   CATARACT EXTRACTION     CHOLECYSTECTOMY     COLONOSCOPY WITH PROPOFOL N/A 12/07/2016   Procedure: COLONOSCOPY WITH PROPOFOL;  Surgeon: Scot Jun, MD;  Location: [facility] ENDOSCOPY;  Service: Endoscopy;  Laterality: N/A;   JOINT REPLACEMENT Bilateral 07/2014, 03/2015   nasal reconstrucion     REDUCTION MAMMAPLASTY Bilateral    SHOULDER OPEN ROTATOR CUFF REPAIR     SOFT TISSUE TUMOR RESECTION     excision tumor soft tissue neck, and soft tissue subcutaneous axilla   TOE SURGERY Right    reconstruction right foot 4th and 5th toe    Allergies  Allergen Reactions   Codeine Other (See Comments)    Seizure. Tolerates tramadol   Povidone Iodine  Shortness Of Breath    Patient had difficulty breathing during procedure where dura prep was used to clean back.    Hydromorphone Hives, Itching and Other (See Comments)    Severe itching hallucinations   Latex Itching and Other (See Comments)    blisters   Oxycodone-Acetaminophen Nausea And Vomiting and Itching   Sulfa Antibiotics Itching and Swelling    Swelling eyes, hands, feet. Was at home and didn't go to the hospital   Bupropion Other (See Comments)   Gabapentin Other (See Comments)    tremors   Hydrocodone     Tolerates tramadol   Morphine And Codeine Itching and Rash   Procaine     ROS    Physical Exam: There were no vitals filed for this visit.  General: The patient is alert and oriented x3 in no acute distress.  Dermatology: Skin is warm, dry and supple bilateral lower extremities. Interspaces are clear of maceration and debris.  Partial thickness breakdown of skin noted to the right second toe dorsal PIPJ associated with hammertoe.  No ecchymosis to lateral ankle.  Vascular: Palpable pedal pulses bilaterally. Capillary refill within normal limits.  No appreciable edema.  No erythema or calor.  Neurological: Light  touch sensation grossly intact bilateral feet.   Musculoskeletal Exam: No tenderness on palpation of the lateral ankle at this point.  Radiographic Exam:  Evidence of trabeculation across the fibular fracture is appreciated.  Fracture lines are still visible.  Incomplete healing.  Alignment of the fracture site is maintained.  Assessment/Plan of Care: 1. Closed fracture of right ankle with routine healing, subsequent encounter      No orders of the defined types were placed in this encounter.  None  Discussed clinical findings with patient today.  Plan: -Patient to continue using the cam walker boot.  She may remove the boot to sleep at this point however she must use the boot whenever weightbearing. -Patient may progress to protected  weightbearing in the cam walker boot. -She may use the Ace wrap as needed for swelling. -Bacitracin and Band-Aid applied to the second toe, patient states this is secondary to irritation with a cam walker boot when the boot is too tight.  Dispensed gel toe caps and corn pads. -Follow-up in 4 weeks for follow-up x-rays.   Bexley Laubach L. Marchia Bond, AACFAS [facility]     [address] [place], [place] [address]                Office [phone]  Fax [phone]

## 2023-06-03 ENCOUNTER — Encounter: Payer: Self-pay | Admitting: Podiatry

## 2023-06-03 ENCOUNTER — Ambulatory Visit (INDEPENDENT_AMBULATORY_CARE_PROVIDER_SITE_OTHER): Payer: Medicare Other

## 2023-06-03 ENCOUNTER — Ambulatory Visit (INDEPENDENT_AMBULATORY_CARE_PROVIDER_SITE_OTHER): Payer: Medicare Other | Admitting: Podiatry

## 2023-06-03 DIAGNOSIS — S82891D Other fracture of right lower leg, subsequent encounter for closed fracture with routine healing: Secondary | ICD-10-CM

## 2023-06-03 NOTE — Patient Instructions (Signed)
  30 Minute Rule: Begin to transition to more weight in a regular shoe on the right leg with normal daily activities (e.g. going to work or running errands, walking around the house If you have pain and swelling for more than 30 minutes following that activity, it's likely too much too soon and should decrease your distance/activity/weight/time and return to CAM boot a little longer the next time you do it If you have some pain and swelling but doesn't last more than 30 minutes, that activity is OK and you can begin to increase your distance/activity/weight/time the next time you do it

## 2023-06-03 NOTE — Progress Notes (Signed)
e

## 2023-06-06 NOTE — Progress Notes (Signed)
No chief complaint on file.   HPI: 77 y.o. female presents today as follow up for right fibular fracture sustained on 03/22/2023.  She is still in rehab at this point. Reports she has been WBAT in boot without pain, however she does ambulate a minimal amount.  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   Dyslipidemia    GERD (gastroesophageal reflux disease)    Hypertension    Hypokalemia    Hypothyroidism    IBS (irritable bowel syndrome)    OAB (overactive bladder)    Ovarian cyst 03/25/2015   3cm simple cyst on right ovary   Rhinitis    Sleep apnea    Spasm of thoracolumbar muscle     Past Surgical History:  Procedure Laterality Date   ABDOMINAL HYSTERECTOMY  07/12/2014   ANKLE RECONSTRUCTION Left    BRACHIOPLASTY     BREAST REDUCTION SURGERY Bilateral 2020   CATARACT EXTRACTION     CHOLECYSTECTOMY     COLONOSCOPY WITH PROPOFOL N/A 12/07/2016   Procedure: COLONOSCOPY WITH PROPOFOL;  Surgeon: Scot Jun, MD;  Location: [facility] ENDOSCOPY;  Service: Endoscopy;  Laterality: N/A;   JOINT REPLACEMENT Bilateral 07/2014, 03/2015   nasal reconstrucion     REDUCTION MAMMAPLASTY Bilateral    SHOULDER OPEN ROTATOR CUFF REPAIR     SOFT TISSUE TUMOR RESECTION     excision tumor soft tissue neck, and soft tissue subcutaneous axilla   TOE SURGERY Right    reconstruction right foot 4th and 5th toe    Allergies  Allergen Reactions   Codeine Other (See Comments)    Seizure. Tolerates tramadol   Povidone Iodine Shortness Of Breath    Patient had difficulty breathing during procedure where dura prep was used to clean back.    Hydromorphone Hives, Itching and Other (See Comments)    Severe itching hallucinations   Latex Itching and Other (See Comments)    blisters    Oxycodone-Acetaminophen Nausea And Vomiting and Itching   Sulfa Antibiotics Itching and Swelling    Swelling eyes, hands, feet. Was at home and didn't go to the hospital   Bupropion Other (See Comments)   Gabapentin Other (See Comments)    tremors   Hydrocodone     Tolerates tramadol   Morphine And Codeine Itching and Rash   Procaine     ROS    Physical Exam: There were no vitals filed for this visit.  General: The patient is alert and oriented x3 in no acute distress.  Dermatology: Skin is warm, dry and supple bilateral lower extremities. Interspaces are clear of maceration and debris.  Partial thickness breakdown of skin noted to the right second toe dorsal PIPJ associated with hammertoe.  No ecchymosis to lateral ankle.  Vascular: Palpable pedal pulses bilaterally. Capillary refill within normal limits.  No appreciable edema.  No erythema or calor.  Neurological: Light touch sensation grossly intact bilateral feet.   Musculoskeletal Exam: No tenderness on palpation of the lateral ankle  Radiographic Exam:  Trabeculation across fracture site is appreciated.  Alignment of the fibula is maintained.  No fracture line visible at this point.  Assessment/Plan of Care: 1. Closed fracture of right ankle with routine healing, subsequent encounter      No  orders of the defined types were placed in this encounter.  None  Discussed clinical findings with patient today.  # Isolated right fibular fracture -Radiographs reviewed with patient.  Appears well-healed at this point - Advised patient she can return to activity as tolerated -Advised that she can return to regular shoe gear. She should slowly wean out of the boot increasing the amount of weightbearing activity to the area by 30 minutes daily -Discussed the use of good supportive shoes - May follow-up as needed at this point.   Porter Nakama L. Marchia Bond, AACFAS [facility]     [address] [place], [place] [address]                Office [phone]  Fax [phone]

## 2023-09-17 NOTE — Progress Notes (Signed)
 Chief Complaint: Chief Complaint  Patient presents with  . Right Shoulder - Injections  . Left Shoulder - Injections   [name] is a 78 y.o. female who presents today for repeat evaluation of ongoing bilateral shoulder pain.  The patient has been evaluated in the past and diagnosed with bilateral rotator cuff tendinopathy in addition to underlying osteoarthritic changes involving both shoulders.  The patient is adamant on avoiding surgical intervention due to other medical conditions.  The patient has been instructed by other providers that surgery would be extremely risky for her.  The patient was last evaluated in January where she underwent a left subacromial and glenohumeral injection in addition to a right subacromial and glenohumeral steroid injection.  The patient states that these injections did provide relief, she does feel that the shoulder pain has been gradually worsening over the last week or 2.  She denies any trauma or injury affecting her shoulders.  The patient is currently at a rehab facility as she does suffer a distal fibula fracture and has been working with physical therapy.  She reports a 7 out of 10 pain score at today's appointment.  She has been taking Tylenol  as needed for discomfort.  Past Medical History: Past Medical History:  Diagnosis Date  . Acquired hypothyroidism 03/04/2016  . Benign positional vertigo   . Brain injury (CMS/HHS-HCC)   . Climacteric   . 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 DJD (degenerative joint disease)    Lumbar and cervical  . Dyslipidemia   . GERD (gastroesophageal reflux disease)   . History of adenomatous polyp of colon 09/17/2016  . HTN, goal below 140/90 03/04/2016  . Hypokalemia 03/04/2016  . Mixed hyperlipidemia 03/04/2016  . OAB (overactive bladder)   . OSA (obstructive sleep apnea)   . Ovarian cyst  03/25/2015   3 cm simple cyst on right ovary  . Rhinitis   . Spasm of thoracolumbar muscle     Past Surgical History: Past Surgical History:  Procedure Laterality Date  . HYSTERECTOMY  1972   TVH Ovaries remain  . COLONOSCOPY  09/27/1998   Spastic Colon  . COLONOSCOPY  08/13/2006   Adenomatous Polyp: CBF 07/2011; Moved to [place] (dw)  . COLONOSCOPY  08/31/2011   PH Adenomatous Polyp: CBF 08/2016; Sch'ed 12/07/2016  . EGD  08/31/2011   Gastritis: No repeat per Dr. Melanie MD [facility])  . COLONOSCOPY  12/07/2016   PH Adenomatous Polyp: CBF 11/2021  . ankle left     reconstruction  . BRACHIOPLASTY    . CATARACT EXTRACTION    . CHOLECYSTECTOMY    . EXCISION TUMOR SOFT TISSUE NECK    . EXCISION TUMOR SOFT TISSUE SUBCUTANEOUS AXILLA    . JOINT REPLACEMENT    . KNEE ARTHROSCOPY Left    knee replacement  . KNEE ARTHROSCOPY Right    knee replaced  . knee revisions Bilateral 07/2014 and 03/2015  . nasal reconstruction    . RECONSTRUCTION TOE     right foot 4th and 5th toe  . REDUCTION MAMMAPLASTY    . REPAIR ROTATOR CUFF TEAR ACUTE OPEN      Past Family History: Family History  Problem Relation Age of Onset  . Lung cancer Mother   . Liver cancer Mother   . High blood pressure (Hypertension) Father   . Lung cancer Father   .  Stroke Father   . Stroke Paternal Grandfather     Medications: Current Outpatient Medications  Medication Sig Dispense Refill  . acetaminophen  (TYLENOL ) 500 MG tablet Take 1,000 mg by mouth as needed for Pain    . omeprazole (PRILOSEC) 20 MG DR capsule Take 1 capsule by mouth once daily    . albuterol  90 mcg/actuation inhaler Inhale 2 inhalations into the lungs every 6 (six) hours as needed for Wheezing 1 Inhaler 1  . amantadine  HCl (SYMMETREL ) 100 mg capsule Take 100 mg by mouth 2 (two) times daily    . amantadine  HCL (SYMMETREL ) 100 mg tablet Take 100 mg by mouth 2 (two) times daily    . amLODIPine  (NORVASC ) 5 MG tablet  TAKE 1 TABLET BY MOUTH EVERY DAY 90 tablet 1  . apixaban (ELIQUIS) 5 mg tablet     . ARIPiprazole  (ABILIFY ) 2 MG tablet Take 2 mg by mouth once daily    . ascorbic acid , vitamin C , (VITAMIN C ) 1000 MG tablet Take 1,000 mg by mouth once daily       . azelastine (ASTELIN) 137 mcg nasal spray Place 1 spray into both nostrils 2 (two) times daily 10 mL 1  . baclofen  (LIORESAL ) 20 MG tablet TAKE 1 TABLET BY MOUTH DAILY 30 tablet 5  . BREO ELLIPTA  100-25 mcg/dose DsDv inhaler Inhale 1 inhalation into the lungs once daily as needed    . carvediloL  (COREG ) 25 MG tablet TAKE 2 TABLETS BY MOUTH TWICE DAILY WITH MEALS 120 tablet 5  . cholecalciferol  (VITAMIN D3) 1000 unit tablet Take 2,000 Units by mouth once daily       . donepezil  (ARICEPT ) 10 MG tablet Take 10 mg by mouth nightly       . esomeprazole (NEXIUM) 40 MG DR capsule Take 40 mg by mouth once daily       . fluticasone  propionate (FLONASE) 50 mcg/actuation nasal spray Place 1 spray into both nostrils 2 (two) times daily 16 g 0  . folic acid  (FOLVITE ) 400 MCG tablet Take 400 mcg by mouth 2 (two) times daily       . levothyroxine  (SYNTHROID ) 112 MCG tablet TAKE 1 TABLET BY MOUTH EVERY DAY ON AN EMPTY STOMACH 90 tablet 1  . losartan  (COZAAR ) 100 MG tablet Take 1 tablet (100 mg total) by mouth once daily 30 tablet 11  . melatonin 3 mg Take 3 mg by mouth nightly       . metOLazone (ZAROXOLYN) 2.5 MG tablet Take 1 tablet (2.5 mg total) by mouth once daily as needed for up to 10 days (Patient not taking: Reported on 01/19/2023) 10 tablet 1  . montelukast  (SINGULAIR ) 10 mg tablet TAKE 1 TABLET BY MOUTH EVERY EVENING 30 tablet 5  . multivitamin tablet Take 1 tablet by mouth once daily       . NYSTOP  100,000 unit/gram powder Apply topically 2 (two) times daily    . potassium chloride  (KLOR-CON ) 20 MEQ ER tablet TAKE 1 TABLET BY MOUTH THREE TIMES DAILY 90 tablet 5  . spironolactone  (ALDACTONE ) 25 MG tablet Take 1 tablet (25 mg total) by mouth once daily 30  tablet 11  . tiZANidine  (ZANAFLEX ) 4 MG tablet Take 1 tablet by mouth as needed    . topiramate  (TOPAMAX ) 25 MG tablet Take 1 tablet (25 mg total) by mouth at bedtime 30 tablet 11  . TORsemide  (DEMADEX ) 20 MG tablet Take 3 tablets (60 mg total) by mouth once daily (Patient taking differently: Take 20 mg by  mouth once daily 10mg  alternate with 20mg  every other day) 270 tablet 3  . traZODone  (DESYREL ) 50 MG tablet Take 1-3 tablets (50-150 mg total) by mouth at bedtime as needed 90 tablet 11  . venlafaxine  (EFFEXOR -XR) 75 MG XR capsule Take 225 mg by mouth once daily     No current facility-administered medications for this visit.    Allergies: Allergies  Allergen Reactions  . Codeine Other (See Comments)    Seizure. Tolerates tramadol   . Hydrocodone Unknown    Tolerates tramadol   . Latex Itching and Other (See Comments)    blisters  . Povidone-Iodine Shortness Of Breath    Patient had difficulty breathing during procedure where dura prep was used to clean back.   . Sulfa (Sulfonamide Antibiotics) Anaphylaxis and Swelling  . Hydromorphone Other (See Comments)  . Hydromorphone Hcl Unknown    DEPRESSION,SUICIDAL  . Oxycodone-Acetaminophen  Nausea And Vomiting  . Sulfasalazine Itching and Swelling    Swelling eyes, hands, feet. Was at home and didn't go to the hospital  . Bupropion Hives, Itching and Other (See Comments)  . Dilaudid [Hydromorphone (Bulk)] Hallucination  . Gabapentin Other (See Comments)    tremors  . Morphine  Rash    Tolerates tramadol   . Novocain [Procaine] Other (See Comments)    Tremors     Review of Systems:  A comprehensive 14 point ROS was performed, reviewed by me today, and the pertinent orthopaedic findings are documented in the HPI.   Exam: BP 128/78   Ht 165.1 cm (5' 5)   Wt (!) 103.4 kg (228 lb)   BMI 37.94 kg/m  General/Constitutional: The patient appears to be well-nourished, well-developed, and in no acute distress. Neuro/Psych: Normal mood  and affect, oriented to person, place and time. Eyes: Non-icteric.  Pupils are equal, round, and reactive to light, and exhibit synchronous movement. ENT: Unremarkable. Lymphatic: No palpable adenopathy. Respiratory: Non-labored breathing Cardiovascular: No edema, swelling or tenderness, except as noted in detailed exam. Integumentary: No impressive skin lesions present, except as noted in detailed exam. Musculoskeletal: Unremarkable, except as noted in detailed exam.  Skin examination of the right shoulder demonstrates no open wounds or signs of infection.  The patient is able to forward flex up to 130 degrees, abduction 125 degrees.  The patient is able to touch the top of her head but this does cause significant discomfort.  With the shoulder abducted 90 degrees she is able to externally rotate 70 degrees, internal rotation 60 degrees.  4+/5 resistance with forward flexion and abduction.  She has moderate tenderness to palpation to both the right subacromial space in addition to the glenohumeral space.  Radial pulses intact to the right hand.  The patient is able to make a full composite fist with 4+/5 grip strength.  The patient does have moderate crepitus with all range of motion activities at today's appointment.  The patient has 4/5 strength with resisted forward flexion in addition to abduction.  Skin examination of the left shoulder demonstrates no open wounds or signs of infection. She is able to abduct 85 degrees of moderate discomfort, forward flexion 90 degrees. The patient is able to touch the top of her head but this does cause significant discomfort. With the shoulder abducted 90 degrees she is able to externally rotate 65 degrees, internal rotation 60 degrees. 4+/5 resistance with forward flexion and abduction. She is tender to palpation along the left subacromial space. Tenderness with palpation along the anterior aspect of the left shoulder as well. The  patient is tender to palpation to  the posterior glenohumeral joint as well. Pulses intact left upper extremity. The patient is able to make a full composite fist with 4+/5 grip strength. The patient does have moderate crepitus with all range of motion activities at today's appointment.   Imaging: Previous x-rays of bilateral shoulders demonstrate significant osteoarthritic changes involving bilateral glenohumeral joints.  TECHNIQUE:MRI SHOULDER LEFT WO IV CONTRAST-- Multiplanar, multisequence MRI of the shoulder was performed.   Contrast: none.   LIMITATIONS: Study limited by positioning and body habitus.  INDICATION: Primary osteoarthritis, left shoulder.  Left shoulder pain. Limited range of motion.  COMPARISON:  None.    FINDINGS:     -  Acromioclavicular joint: Moderate degenerative changes. Minimal joint fluid. No malalignment. -  Acromion: Subacromial spurring. Trace subacromial/subdeltoid bursal fluid. -  Supraspinatus tendon: Intermediate signal/moderate tendinosis without discrete tear. No retraction or atrophy. -  Infraspinatus tendon: Mild tendinosis. No tear, retraction or atrophy. -  Subscapularis tendon: Mild tendinosis. No tear, retraction or atrophy. -  Teres minor tendon:  Intact. -  Long head of the biceps: Intact and in anatomic position. -  Glenohumeral joint: Severe glenohumeral joint degenerative changes. Large subchondral cysts of full-thickness cartilage loss throughout the humeral head and glenoid. Bone-on-bone contact. Prominent osteophytes. Trace joint fluid extending into the  superior subscapularis recess. Mild posterior descending of the humeral head. -  Labrum: Degenerative labral fraying/tear of the posterior labrum with probable involvement of the inferior labrum. However, evaluation is limited by lack of intra-articular fluid/contrast. -  Bones: No acute fracture, neoplasm, or avascular necrosis. -  Soft tissues: No abnormal masses or fluid collections.   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  Treatment options were discussed today with the patient. 2.  The patient was offered and received a right glenohumeral and subacromial steroid injection in addition to a left glenohumeral and subacromial steroid injection. 3.  The patient was instructed to decrease her activities over the next 24-48 hours. 4.  The patient was instructed that I can repeat these injections in 3 months Robin needed. 5.  They can call the clinic they have any questions, new symptoms develop or symptoms worsen.  Subacromial shoulder injection:  The risks, benefits, and alternatives of the proposed injection were discussed with patient.  The patient states that she understands these and she verbally consents to proceed.  A surgical time out was performed.  The patient's bilateral lateral shoulder was cleaned with an alcohol  prep.  Topical skin anesthesia was obtained using ethyl chloride spray.  Using aseptic technique, the subacromial space was injected with a solution of 1 cc of Kenalog-40 (40 mg) and 4 cc of 0.5% Marcaine.  The patient tolerated the procedure well and without any complications.  The patient was counseled as to the expected post-injection course, including the possibility of temporary worsening of symptoms.  She also was counseled as to concerning symptoms or signs, and was instructed to contact our office Robin any of these should develop.  Glenohumeral injection:  The risks, benefits, and alternatives of the proposed injection were discussed with patient.  The patient states that she understands these and she verbally consents to proceed.  A surgical time out was performed.  The patient's bilateral posterior shoulder was cleaned with an alcohol  prep.  Topical skin anesthesia was  obtained using  ethyl chloride spray.  Using aseptic technique, the shoulder was injected with a solution of 1 cc of Kenalog-40 (80 mg) and 4 cc of 0.5% Marcaine.  The patient tolerated the procedure well and without any complications.  The patient was counseled as to the expected post-injection course, including the possibility of temporary worsening of symptoms and possible increase in blood glucose levels.  She also was counseled as to concerning symptoms or signs, and was instructed to contact our office Robin any of these should develop.  This office visit took 45 minutes, of which >50% involved patient counseling/education.  Review of the [place] CSRS was performed in accordance of the [employer] prior to dispensing any controlled drugs.  This note was generated in part with voice recognition software and I apologize for any typographical errors that were not detected and corrected.  DOROTHA Gustavo Level, PA-C [facility]

## 2023-09-17 NOTE — Progress Notes (Signed)
 Large Joint Injection: bilateral subacromial bursa  Date/Time: 09/17/2023 10:27 AM  Performed by: Kip Lynwood Double, PA Authorized by: Kip Lynwood Double, PA   Location:  Shoulder Laterality:  Bilateral Site:  Bilateral subacromial bursa Medications (Right):  8 mL BUPivacaine HCl 0.5 %; 80 mg triamcinolone acetonide 40 mg/mL Medications (Left):  8 mL BUPivacaine HCl 0.5 %; 80 mg triamcinolone acetonide 40 mg/mL

## 2024-01-27 ENCOUNTER — Emergency Department (HOSPITAL_COMMUNITY)

## 2024-01-27 ENCOUNTER — Other Ambulatory Visit: Payer: Self-pay

## 2024-01-27 ENCOUNTER — Inpatient Hospital Stay (HOSPITAL_COMMUNITY)
Admission: EM | Admit: 2024-01-27 | Discharge: 2024-02-09 | DRG: 871 | Disposition: A | Discharge status: Hospice | Source: Skilled Nursing Facility | Attending: Internal Medicine | Admitting: Internal Medicine

## 2024-01-27 ENCOUNTER — Encounter (HOSPITAL_COMMUNITY): Payer: Self-pay

## 2024-01-27 DIAGNOSIS — Z6841 Body Mass Index (BMI) 40.0 and over, adult: Secondary | ICD-10-CM

## 2024-01-27 DIAGNOSIS — Z9981 Dependence on supplemental oxygen: Secondary | ICD-10-CM

## 2024-01-27 DIAGNOSIS — N390 Urinary tract infection, site not specified: Secondary | ICD-10-CM | POA: Diagnosis present

## 2024-01-27 DIAGNOSIS — Z882 Allergy status to sulfonamides status: Secondary | ICD-10-CM

## 2024-01-27 DIAGNOSIS — G9340 Encephalopathy, unspecified: Secondary | ICD-10-CM | POA: Insufficient documentation

## 2024-01-27 DIAGNOSIS — D72829 Elevated white blood cell count, unspecified: Secondary | ICD-10-CM | POA: Insufficient documentation

## 2024-01-27 DIAGNOSIS — Z7901 Long term (current) use of anticoagulants: Secondary | ICD-10-CM

## 2024-01-27 DIAGNOSIS — Z87820 Personal history of traumatic brain injury: Secondary | ICD-10-CM

## 2024-01-27 DIAGNOSIS — Z884 Allergy status to anesthetic agent status: Secondary | ICD-10-CM

## 2024-01-27 DIAGNOSIS — J9601 Acute respiratory failure with hypoxia: Secondary | ICD-10-CM

## 2024-01-27 DIAGNOSIS — Z885 Allergy status to narcotic agent status: Secondary | ICD-10-CM

## 2024-01-27 DIAGNOSIS — R319 Hematuria, unspecified: Secondary | ICD-10-CM | POA: Diagnosis present

## 2024-01-27 DIAGNOSIS — Z79899 Other long term (current) drug therapy: Secondary | ICD-10-CM

## 2024-01-27 DIAGNOSIS — I2489 Other forms of acute ischemic heart disease: Secondary | ICD-10-CM | POA: Diagnosis present

## 2024-01-27 DIAGNOSIS — F0393 Unspecified dementia, unspecified severity, with mood disturbance: Secondary | ICD-10-CM | POA: Diagnosis present

## 2024-01-27 DIAGNOSIS — S069XAS Unspecified intracranial injury with loss of consciousness status unknown, sequela: Secondary | ICD-10-CM

## 2024-01-27 DIAGNOSIS — R58 Hemorrhage, not elsewhere classified: Secondary | ICD-10-CM | POA: Insufficient documentation

## 2024-01-27 DIAGNOSIS — Z91041 Radiographic dye allergy status: Secondary | ICD-10-CM

## 2024-01-27 DIAGNOSIS — K922 Gastrointestinal hemorrhage, unspecified: Principal | ICD-10-CM | POA: Diagnosis present

## 2024-01-27 DIAGNOSIS — T83511A Infection and inflammatory reaction due to indwelling urethral catheter, initial encounter: Secondary | ICD-10-CM | POA: Diagnosis not present

## 2024-01-27 DIAGNOSIS — Z9049 Acquired absence of other specified parts of digestive tract: Secondary | ICD-10-CM

## 2024-01-27 DIAGNOSIS — K589 Irritable bowel syndrome without diarrhea: Secondary | ICD-10-CM | POA: Diagnosis present

## 2024-01-27 DIAGNOSIS — E039 Hypothyroidism, unspecified: Secondary | ICD-10-CM | POA: Diagnosis present

## 2024-01-27 DIAGNOSIS — E872 Acidosis, unspecified: Secondary | ICD-10-CM | POA: Diagnosis present

## 2024-01-27 DIAGNOSIS — N179 Acute kidney failure, unspecified: Secondary | ICD-10-CM | POA: Diagnosis present

## 2024-01-27 DIAGNOSIS — N1832 Chronic kidney disease, stage 3b: Secondary | ICD-10-CM | POA: Diagnosis present

## 2024-01-27 DIAGNOSIS — F32A Depression, unspecified: Secondary | ICD-10-CM | POA: Diagnosis present

## 2024-01-27 DIAGNOSIS — E1122 Type 2 diabetes mellitus with diabetic chronic kidney disease: Secondary | ICD-10-CM | POA: Diagnosis present

## 2024-01-27 DIAGNOSIS — I10 Essential (primary) hypertension: Secondary | ICD-10-CM | POA: Diagnosis present

## 2024-01-27 DIAGNOSIS — I447 Left bundle-branch block, unspecified: Secondary | ICD-10-CM | POA: Diagnosis present

## 2024-01-27 DIAGNOSIS — Z79890 Hormone replacement therapy (postmenopausal): Secondary | ICD-10-CM

## 2024-01-27 DIAGNOSIS — Z888 Allergy status to other drugs, medicaments and biological substances status: Secondary | ICD-10-CM

## 2024-01-27 DIAGNOSIS — R4182 Altered mental status, unspecified: Secondary | ICD-10-CM

## 2024-01-27 DIAGNOSIS — M47812 Spondylosis without myelopathy or radiculopathy, cervical region: Secondary | ICD-10-CM | POA: Diagnosis present

## 2024-01-27 DIAGNOSIS — G4733 Obstructive sleep apnea (adult) (pediatric): Secondary | ICD-10-CM | POA: Diagnosis present

## 2024-01-27 DIAGNOSIS — K5641 Fecal impaction: Secondary | ICD-10-CM | POA: Diagnosis not present

## 2024-01-27 DIAGNOSIS — M47816 Spondylosis without myelopathy or radiculopathy, lumbar region: Secondary | ICD-10-CM | POA: Diagnosis present

## 2024-01-27 DIAGNOSIS — I130 Hypertensive heart and chronic kidney disease with heart failure and stage 1 through stage 4 chronic kidney disease, or unspecified chronic kidney disease: Secondary | ICD-10-CM | POA: Diagnosis present

## 2024-01-27 DIAGNOSIS — Z8616 Personal history of COVID-19: Secondary | ICD-10-CM

## 2024-01-27 DIAGNOSIS — Z90710 Acquired absence of both cervix and uterus: Secondary | ICD-10-CM

## 2024-01-27 DIAGNOSIS — E785 Hyperlipidemia, unspecified: Secondary | ICD-10-CM | POA: Diagnosis present

## 2024-01-27 DIAGNOSIS — I5032 Chronic diastolic (congestive) heart failure: Secondary | ICD-10-CM | POA: Diagnosis present

## 2024-01-27 DIAGNOSIS — G9349 Other encephalopathy: Secondary | ICD-10-CM | POA: Diagnosis not present

## 2024-01-27 DIAGNOSIS — R4189 Other symptoms and signs involving cognitive functions and awareness: Secondary | ICD-10-CM | POA: Diagnosis present

## 2024-01-27 DIAGNOSIS — Z801 Family history of malignant neoplasm of trachea, bronchus and lung: Secondary | ICD-10-CM

## 2024-01-27 DIAGNOSIS — Z91040 Latex allergy status: Secondary | ICD-10-CM

## 2024-01-27 DIAGNOSIS — Z86718 Personal history of other venous thrombosis and embolism: Secondary | ICD-10-CM

## 2024-01-27 DIAGNOSIS — Z66 Do not resuscitate: Secondary | ICD-10-CM | POA: Diagnosis present

## 2024-01-27 DIAGNOSIS — R34 Anuria and oliguria: Secondary | ICD-10-CM | POA: Diagnosis present

## 2024-01-27 DIAGNOSIS — E871 Hypo-osmolality and hyponatremia: Secondary | ICD-10-CM | POA: Diagnosis present

## 2024-01-27 DIAGNOSIS — E66813 Obesity, class 3: Secondary | ICD-10-CM | POA: Diagnosis present

## 2024-01-27 DIAGNOSIS — Z515 Encounter for palliative care: Secondary | ICD-10-CM

## 2024-01-27 DIAGNOSIS — G9341 Metabolic encephalopathy: Secondary | ICD-10-CM | POA: Diagnosis present

## 2024-01-27 DIAGNOSIS — D631 Anemia in chronic kidney disease: Secondary | ICD-10-CM | POA: Diagnosis present

## 2024-01-27 DIAGNOSIS — N19 Unspecified kidney failure: Secondary | ICD-10-CM

## 2024-01-27 DIAGNOSIS — N3281 Overactive bladder: Secondary | ICD-10-CM | POA: Diagnosis present

## 2024-01-27 DIAGNOSIS — J95811 Postprocedural pneumothorax: Secondary | ICD-10-CM | POA: Diagnosis not present

## 2024-01-27 DIAGNOSIS — Y846 Urinary catheterization as the cause of abnormal reaction of the patient, or of later complication, without mention of misadventure at the time of the procedure: Secondary | ICD-10-CM | POA: Diagnosis present

## 2024-01-27 DIAGNOSIS — D649 Anemia, unspecified: Secondary | ICD-10-CM | POA: Insufficient documentation

## 2024-01-27 DIAGNOSIS — Z803 Family history of malignant neoplasm of breast: Secondary | ICD-10-CM

## 2024-01-27 DIAGNOSIS — E1165 Type 2 diabetes mellitus with hyperglycemia: Secondary | ICD-10-CM | POA: Diagnosis present

## 2024-01-27 DIAGNOSIS — J9621 Acute and chronic respiratory failure with hypoxia: Secondary | ICD-10-CM | POA: Diagnosis present

## 2024-01-27 DIAGNOSIS — K219 Gastro-esophageal reflux disease without esophagitis: Secondary | ICD-10-CM | POA: Diagnosis present

## 2024-01-27 DIAGNOSIS — L89152 Pressure ulcer of sacral region, stage 2: Secondary | ICD-10-CM | POA: Diagnosis present

## 2024-01-27 LAB — POC OCCULT BLOOD, ED: Fecal Occult Bld: POSITIVE — AB

## 2024-01-27 LAB — CBC WITH DIFFERENTIAL/PLATELET
Abs Immature Granulocytes: 0.35 K/uL — ABNORMAL HIGH (ref 0.00–0.07)
Basophils Absolute: 0 K/uL (ref 0.0–0.1)
Basophils Relative: 0 %
Eosinophils Absolute: 0 K/uL (ref 0.0–0.5)
Eosinophils Relative: 0 %
HCT: 33.6 % — ABNORMAL LOW (ref 36.0–46.0)
Hemoglobin: 11.6 g/dL — ABNORMAL LOW (ref 12.0–15.0)
Immature Granulocytes: 2 %
Lymphocytes Relative: 3 %
Lymphs Abs: 0.6 K/uL — ABNORMAL LOW (ref 0.7–4.0)
MCH: 31.8 pg (ref 26.0–34.0)
MCHC: 34.5 g/dL (ref 30.0–36.0)
MCV: 92.1 fL (ref 80.0–100.0)
Monocytes Absolute: 1 K/uL (ref 0.1–1.0)
Monocytes Relative: 4 %
Neutro Abs: 20.3 K/uL — ABNORMAL HIGH (ref 1.7–7.7)
Neutrophils Relative %: 91 %
Platelets: 204 K/uL (ref 150–400)
RBC: 3.65 MIL/uL — ABNORMAL LOW (ref 3.87–5.11)
RDW: 12.9 % (ref 11.5–15.5)
WBC: 22.2 K/uL — ABNORMAL HIGH (ref 4.0–10.5)
nRBC: 0 % (ref 0.0–0.2)

## 2024-01-27 LAB — I-STAT CHEM 8, ED
BUN: 126 mg/dL — ABNORMAL HIGH (ref 8–23)
Calcium, Ion: 1.33 mmol/L (ref 1.15–1.40)
Chloride: 97 mmol/L — ABNORMAL LOW (ref 98–111)
Creatinine, Ser: 3.6 mg/dL — ABNORMAL HIGH (ref 0.44–1.00)
Glucose, Bld: 232 mg/dL — ABNORMAL HIGH (ref 70–99)
HCT: 30 % — ABNORMAL LOW (ref 36.0–46.0)
Hemoglobin: 10.2 g/dL — ABNORMAL LOW (ref 12.0–15.0)
Potassium: 4.9 mmol/L (ref 3.5–5.1)
Sodium: 132 mmol/L — ABNORMAL LOW (ref 135–145)
TCO2: 21 mmol/L — ABNORMAL LOW (ref 22–32)

## 2024-01-27 LAB — COMPREHENSIVE METABOLIC PANEL WITH GFR
ALT: 17 U/L (ref 0–44)
AST: 26 U/L (ref 15–41)
Albumin: 3.3 g/dL — ABNORMAL LOW (ref 3.5–5.0)
Alkaline Phosphatase: 43 U/L (ref 38–126)
Anion gap: 15 (ref 5–15)
BUN: 127 mg/dL — ABNORMAL HIGH (ref 8–23)
CO2: 21 mmol/L — ABNORMAL LOW (ref 22–32)
Calcium: 10.3 mg/dL (ref 8.9–10.3)
Chloride: 95 mmol/L — ABNORMAL LOW (ref 98–111)
Creatinine, Ser: 3.09 mg/dL — ABNORMAL HIGH (ref 0.44–1.00)
GFR, Estimated: 15 mL/min — ABNORMAL LOW (ref >60)
Glucose, Bld: 241 mg/dL — ABNORMAL HIGH (ref 70–99)
Potassium: 4.3 mmol/L (ref 3.5–5.1)
Sodium: 131 mmol/L — ABNORMAL LOW (ref 135–145)
Total Bilirubin: 0.9 mg/dL (ref 0.0–1.2)
Total Protein: 6.3 g/dL — ABNORMAL LOW (ref 6.5–8.1)

## 2024-01-27 LAB — I-STAT VENOUS BLOOD GAS, ED
Acid-base deficit: 2 mmol/L (ref 0.0–2.0)
Bicarbonate: 22.2 mmol/L (ref 20.0–28.0)
Calcium, Ion: 1.31 mmol/L (ref 1.15–1.40)
HCT: 34 % — ABNORMAL LOW (ref 36.0–46.0)
Hemoglobin: 11.6 g/dL — ABNORMAL LOW (ref 12.0–15.0)
O2 Saturation: 72 %
Potassium: 4.1 mmol/L (ref 3.5–5.1)
Sodium: 132 mmol/L — ABNORMAL LOW (ref 135–145)
TCO2: 23 mmol/L (ref 22–32)
pCO2, Ven: 35.4 mmHg — ABNORMAL LOW (ref 44–60)
pH, Ven: 7.406 (ref 7.25–7.43)
pO2, Ven: 37 mmHg (ref 32–45)

## 2024-01-27 LAB — BRAIN NATRIURETIC PEPTIDE: B Natriuretic Peptide: 94.4 pg/mL (ref 0.0–100.0)

## 2024-01-27 LAB — TROPONIN I (HIGH SENSITIVITY)
Troponin I (High Sensitivity): 84 ng/L — ABNORMAL HIGH (ref <18)
Troponin I (High Sensitivity): 93 ng/L — ABNORMAL HIGH (ref <18)

## 2024-01-27 LAB — TYPE AND SCREEN
ABO/RH(D): B POS
Antibody Screen: NEGATIVE

## 2024-01-27 LAB — I-STAT CG4 LACTIC ACID, ED
Lactic Acid, Venous: 1.7 mmol/L (ref 0.5–1.9)
Lactic Acid, Venous: 1.3 mmol/L (ref 0.5–1.9)

## 2024-01-27 LAB — PROTIME-INR
INR: 1.9 — ABNORMAL HIGH (ref 0.8–1.2)
Prothrombin Time: 23 s — ABNORMAL HIGH (ref 11.4–15.2)

## 2024-01-27 MED ORDER — SODIUM CHLORIDE 0.9 % IV BOLUS
500.0000 mL | Freq: Once | INTRAVENOUS | Status: AC
  Administered 2024-01-27: 500 mL via INTRAVENOUS

## 2024-01-27 MED ORDER — PANTOPRAZOLE SODIUM 40 MG IV SOLR
40.0000 mg | Freq: Once | INTRAVENOUS | Status: AC
  Administered 2024-01-27: 40 mg via INTRAVENOUS
  Filled 2024-01-27: qty 10

## 2024-01-27 MED ORDER — SODIUM CHLORIDE 0.9 % IV SOLN: INTRAVENOUS | Status: DC

## 2024-01-27 NOTE — ED Notes (Signed)
 Placed barrier cream on inner thights bilaterally and underneath left armpit. *see photo attached in chart*

## 2024-01-27 NOTE — ED Notes (Signed)
 Spoke with staff at [facility] where patient was sent from, staff I spoke with stated that patient has no diagnosis for long term foley. She then advised that patient is normally AO x 2 at baseline.

## 2024-01-27 NOTE — ED Provider Notes (Addendum)
Patient's delta troponins first 1 was 80 4 repeat 93 but less than 10.  Patient's venous blood gas was reassuring with a pH 7.4 pCO2 34.  Patient's white count high at 22 differential for that mostly was absolute neutrophils.  Patient's lactic acids were normal although at 1.3.  Hemoglobin stable at 11.6.  INR 1.9.  BMP normal at 94.3.  Hemoccult was positive.  Chest x-ray fullness right suprahilar may represent vascular component versus true pulmonary mediastinal lesion recommend chest CT for further assessment.  That can be done in house.  Will contact the hospitalist for admission.  Discussed with hospitalist.  They are recommending CT will have to be done without because it does appear she got acute kidney injury as well.  Will order that for them they will see her and admit her.   Robin Poythress, MD 01/27/24 7782    Robin Corona, MD 01/27/24 2251

## 2024-01-27 NOTE — ED Triage Notes (Signed)
 Pt from [facility] in [place] and staff reported saturated diaper in blood, source coming from foley catheter. No n/v. Recently put on eliquis on the 6th. VSS. Axox4. No bloody stools/hematemesis.

## 2024-01-27 NOTE — ED Notes (Signed)
 Attempted to reinsert foley, patient resistant and screaming to same. We verified with provider to proceed, even though patient was unagreeable, and MD Zackowski advised to proceed due to her AMS. However, patient physically would not let us  reinsert foley.

## 2024-01-27 NOTE — ED Notes (Signed)
 Patient rolled with help from NT, and bleeding source indeterminate.

## 2024-01-27 NOTE — ED Notes (Signed)
 Patient has multiple areas of breakdown and skin irritation, pictures added to chart by myself.

## 2024-01-27 NOTE — ED Provider Notes (Signed)
 [place] EMERGENCY DEPARTMENT AT [facility] Provider Note   CSN: [id number] Arrival date & time: 01/27/24  1436     Patient presents with: No chief complaint on file.   [name] is a 78 y.o. female.  She is brought in from [facility] in [place] after staff noticed there was blood in her diaper.  She has a Foley catheter in place.  Patient is on anticoagulation for unclear reason at this time.  Level 5 caveat secondary to somnolence.  Bleeding source is unknown.   The history is provided by the patient.       Prior to Admission medications   Medication Sig Start Date End Date Taking? Authorizing Provider  acetaminophen  (TYLENOL ) 500 MG tablet Take 500 mg by mouth every 6 (six) hours as needed.    [provider]  Amantadine  HCl 100 MG tablet Take 100 mg by mouth 2 (two) times daily. 03/21/23   [provider]  amLODipine  (NORVASC ) 5 MG tablet Take 5 mg by mouth daily.    [provider]  baclofen  (LIORESAL ) 20 MG tablet Take 20 mg by mouth daily as needed for muscle spasms.    [provider]  carvedilol  (COREG ) 25 MG tablet Take 50 mg by mouth 2 (two) times daily with a meal.    [provider]  Cholecalciferol  (VITAMIN D3) 2000 units TABS Take 2,000 Units daily by mouth.    [provider]  donepezil  (ARICEPT ) 10 MG tablet Take 10 mg by mouth daily.    [provider]  esomeprazole (NEXIUM) 40 MG capsule Take 40 mg by mouth daily.    [provider]  fluticasone  (FLONASE) 50 MCG/ACT nasal spray Place 1 spray into both nostrils daily as needed for rhinitis or allergies.    [provider]  folic acid  (FOLVITE ) 400 MCG tablet Take 400 mcg by mouth daily.    [provider]  levothyroxine  (SYNTHROID , LEVOTHROID) 112 MCG tablet Take 112 mcg by mouth daily.    [provider]  losartan  (COZAAR ) 100 MG tablet Take 100 mg by mouth daily.    [provider]  Melatonin 3 MG TABS Take 1 tablet at bedtime by mouth.     [provider]  montelukast  (SINGULAIR ) 10 MG tablet Take 10 mg by mouth every evening.    [provider]  Multiple Vitamin (MULTIVITAMIN) tablet Take 1 tablet by mouth daily.    [provider]  nystatin  (MYCOSTATIN /NYSTOP ) powder Apply topically 2 (two) times daily. Patient taking differently: Apply 1 Application topically 2 (two) times daily as needed. 07/06/20   Von Bellis, MD  spironolactone  (ALDACTONE ) 25 MG tablet Take 25 mg by mouth daily. 10/15/22   [provider]  topiramate  (TOPAMAX ) 25 MG tablet Take 25 mg by mouth daily.    [provider]  torsemide  (DEMADEX ) 20 MG tablet Take 20 mg by mouth daily. Takes 10mg  unless ankle is swelling, then takes 20mg     [provider]  traZODone  (DESYREL ) 50 MG tablet Take 50 mg by mouth at bedtime. 50-150mg  nightly as needed    [provider]  venlafaxine  XR (EFFEXOR -XR) 75 MG 24 hr capsule Take 150 mg by mouth 2 (two) times daily. 10/17/22   [provider]    Allergies: Codeine, Povidone iodine, Hydromorphone, Latex, Oxycodone-acetaminophen , Sulfa antibiotics, Bupropion, Gabapentin, Hydrocodone, Morphine  and codeine, and Procaine    Review of Systems  Unable to perform ROS: Mental status change  Updated Vital Signs BP 127/77   Pulse 66   Temp 98 F (36.7 C) (Oral)   Resp 14   Ht 5' 6 (1.676 m)   SpO2 100%   BMI 36.80 kg/m   Physical Exam Vitals and nursing note reviewed.  Constitutional:      General: She is not in acute distress.    Appearance: She is well-developed. She is obese.  HENT:     Head: Normocephalic and atraumatic.  Eyes:     Conjunctiva/sclera: Conjunctivae normal.  Cardiovascular:     Rate and Rhythm: Normal rate and regular rhythm.     Heart sounds: No murmur heard. Pulmonary:     Effort: Pulmonary effort is normal. No respiratory distress.     Breath  sounds: Normal breath sounds. No stridor. No wheezing.  Abdominal:     Palpations: Abdomen is soft.     Tenderness: There is no abdominal tenderness. There is no guarding or rebound.  Genitourinary:    Comments: She has dried blood over her perineal area.  Do not see any obvious vaginal bleeding.  Had some hard stool in vault without obvious mass. Musculoskeletal:     Cervical back: Neck supple.     Right lower leg: Edema present.     Left lower leg: Edema present.  Skin:    General: Skin is warm and dry.  Neurological:     GCS: GCS eye subscore is 4. GCS verbal subscore is 5. GCS motor subscore is 6.     Comments: She is arousable to voice and will converse for 15 to 20 seconds and then falls back to sleep.  She is moving her upper extremities without any difficulty.  Minimally using lower extremities.     (all labs ordered are listed, but only abnormal results are displayed) Labs Reviewed  POC OCCULT BLOOD, ED - Abnormal; Notable for the following components:      Result Value   Fecal Occult Bld POSITIVE (*)    All other components within normal limits  I-STAT CHEM 8, ED - Abnormal; Notable for the following components:   Sodium 132 (*)    Chloride 97 (*)    BUN 126 (*)    Creatinine, Ser 3.60 (*)    Glucose, Bld 232 (*)    TCO2 21 (*)    Hemoglobin 10.2 (*)    HCT 30.0 (*)    All other components within normal limits  CBC WITH DIFFERENTIAL/PLATELET  PROTIME-INR  COMPREHENSIVE METABOLIC PANEL WITH GFR  URINALYSIS, W/ REFLEX TO CULTURE (INFECTION SUSPECTED)  BRAIN NATRIURETIC PEPTIDE  I-STAT VENOUS BLOOD GAS, ED  I-STAT CG4 LACTIC ACID, ED  I-STAT CG4 LACTIC ACID, ED  TYPE AND SCREEN  TROPONIN I (HIGH SENSITIVITY)  TROPONIN I (HIGH SENSITIVITY)    EKG: EKG Interpretation Date/Time:  Thursday January 27 2024 15:39:13 EDT Ventricular Rate:  68 PR Interval:  62 QRS Duration:  127 QT Interval:  456 QTC Calculation: 485 R Axis:   17  Text Interpretation: Sinus  rhythm Short PR interval Right atrial enlargement Left bundle branch block Confirmed by Towana Sharper [phone]) on 01/27/2024 3:40:52 PM  Radiology: No results found.   Procedures   Medications Ordered in the ED - No data to display  Clinical Course as of 01/27/24 1746  Thu Jan 27, 2024  1547 due to her habitus it is hard to see where she is bleeding from.  There is no active bleeding going on.  Will have the nurse replace Foley  as there is no urine in the tube although the urine in the bag is yellow. [MB]    Clinical Course User Index [MB] Towana Ozell BROCKS, MD                                 Medical Decision Making Amount and/or Complexity of Data Reviewed Labs: ordered. Radiology: ordered.   This patient complains of perineal bleeding, somnolence; this involves an extensive number of treatment Options and is a complaint that carries with it a high risk of complications and morbidity. The differential includes rectal bleed, hematuria, vaginal bleeding, hypercapnia, respiratory failure, overmedication  I ordered, reviewed and interpreted labs, which included i-STAT with elevated creatinine, lactic acid normal, fecal occult positive I ordered medication PPI and reviewed PMP when indicated. I ordered imaging studies which included chest x-ray and was not completed at time of signout  previous records obtained and reviewed in epic, no recent ED visits Cardiac monitoring reviewed, sinus rhythm Social determinants considered, no significant barriers Critical Interventions: None  After the interventions stated above, I reevaluated the patient and found somnolent arousable Admission and further testing considered, her care is signed out to Dr. Zackowski to follow-up on labs.  Awaiting IV team to establish access.  Anticipate will need admission for further workup.      Final diagnoses:  Acute GI bleeding  Altered mental status, unspecified altered mental status type    ED  Discharge Orders     None          Towana Ozell BROCKS, MD 01/27/24 1750

## 2024-01-28 ENCOUNTER — Inpatient Hospital Stay (HOSPITAL_COMMUNITY)

## 2024-01-28 ENCOUNTER — Emergency Department (HOSPITAL_COMMUNITY)

## 2024-01-28 ENCOUNTER — Encounter (HOSPITAL_COMMUNITY): Payer: Self-pay | Admitting: Internal Medicine

## 2024-01-28 DIAGNOSIS — K625 Hemorrhage of anus and rectum: Secondary | ICD-10-CM

## 2024-01-28 DIAGNOSIS — Z7189 Other specified counseling: Secondary | ICD-10-CM | POA: Diagnosis not present

## 2024-01-28 DIAGNOSIS — Z8616 Personal history of COVID-19: Secondary | ICD-10-CM | POA: Diagnosis not present

## 2024-01-28 DIAGNOSIS — R569 Unspecified convulsions: Secondary | ICD-10-CM | POA: Diagnosis not present

## 2024-01-28 DIAGNOSIS — N179 Acute kidney failure, unspecified: Secondary | ICD-10-CM

## 2024-01-28 DIAGNOSIS — E872 Acidosis, unspecified: Secondary | ICD-10-CM | POA: Diagnosis present

## 2024-01-28 DIAGNOSIS — G9341 Metabolic encephalopathy: Secondary | ICD-10-CM | POA: Diagnosis present

## 2024-01-28 DIAGNOSIS — D649 Anemia, unspecified: Secondary | ICD-10-CM | POA: Insufficient documentation

## 2024-01-28 DIAGNOSIS — Z86718 Personal history of other venous thrombosis and embolism: Secondary | ICD-10-CM

## 2024-01-28 DIAGNOSIS — Y846 Urinary catheterization as the cause of abnormal reaction of the patient, or of later complication, without mention of misadventure at the time of the procedure: Secondary | ICD-10-CM | POA: Diagnosis present

## 2024-01-28 DIAGNOSIS — Z66 Do not resuscitate: Secondary | ICD-10-CM | POA: Diagnosis present

## 2024-01-28 DIAGNOSIS — R7989 Other specified abnormal findings of blood chemistry: Secondary | ICD-10-CM

## 2024-01-28 DIAGNOSIS — J9621 Acute and chronic respiratory failure with hypoxia: Secondary | ICD-10-CM | POA: Diagnosis present

## 2024-01-28 DIAGNOSIS — J9601 Acute respiratory failure with hypoxia: Secondary | ICD-10-CM | POA: Diagnosis not present

## 2024-01-28 DIAGNOSIS — N170 Acute kidney failure with tubular necrosis: Secondary | ICD-10-CM | POA: Diagnosis not present

## 2024-01-28 DIAGNOSIS — D72829 Elevated white blood cell count, unspecified: Secondary | ICD-10-CM | POA: Insufficient documentation

## 2024-01-28 DIAGNOSIS — G9340 Encephalopathy, unspecified: Secondary | ICD-10-CM | POA: Insufficient documentation

## 2024-01-28 DIAGNOSIS — N1832 Chronic kidney disease, stage 3b: Secondary | ICD-10-CM | POA: Diagnosis present

## 2024-01-28 DIAGNOSIS — R579 Shock, unspecified: Secondary | ICD-10-CM | POA: Diagnosis not present

## 2024-01-28 DIAGNOSIS — J9600 Acute respiratory failure, unspecified whether with hypoxia or hypercapnia: Secondary | ICD-10-CM | POA: Diagnosis not present

## 2024-01-28 DIAGNOSIS — R4182 Altered mental status, unspecified: Secondary | ICD-10-CM

## 2024-01-28 DIAGNOSIS — R609 Edema, unspecified: Secondary | ICD-10-CM

## 2024-01-28 DIAGNOSIS — R58 Hemorrhage, not elsewhere classified: Secondary | ICD-10-CM | POA: Insufficient documentation

## 2024-01-28 DIAGNOSIS — F32A Depression, unspecified: Secondary | ICD-10-CM | POA: Diagnosis present

## 2024-01-28 DIAGNOSIS — E871 Hypo-osmolality and hyponatremia: Secondary | ICD-10-CM | POA: Diagnosis present

## 2024-01-28 DIAGNOSIS — I2489 Other forms of acute ischemic heart disease: Secondary | ICD-10-CM | POA: Diagnosis present

## 2024-01-28 DIAGNOSIS — T83511A Infection and inflammatory reaction due to indwelling urethral catheter, initial encounter: Secondary | ICD-10-CM | POA: Diagnosis present

## 2024-01-28 DIAGNOSIS — Z515 Encounter for palliative care: Secondary | ICD-10-CM | POA: Diagnosis not present

## 2024-01-28 DIAGNOSIS — E039 Hypothyroidism, unspecified: Secondary | ICD-10-CM | POA: Diagnosis present

## 2024-01-28 DIAGNOSIS — Z789 Other specified health status: Secondary | ICD-10-CM | POA: Diagnosis not present

## 2024-01-28 DIAGNOSIS — K922 Gastrointestinal hemorrhage, unspecified: Secondary | ICD-10-CM | POA: Diagnosis present

## 2024-01-28 DIAGNOSIS — R34 Anuria and oliguria: Secondary | ICD-10-CM | POA: Diagnosis present

## 2024-01-28 DIAGNOSIS — F0393 Unspecified dementia, unspecified severity, with mood disturbance: Secondary | ICD-10-CM | POA: Diagnosis present

## 2024-01-28 DIAGNOSIS — G9349 Other encephalopathy: Secondary | ICD-10-CM | POA: Diagnosis not present

## 2024-01-28 DIAGNOSIS — E1122 Type 2 diabetes mellitus with diabetic chronic kidney disease: Secondary | ICD-10-CM | POA: Diagnosis present

## 2024-01-28 DIAGNOSIS — J95811 Postprocedural pneumothorax: Secondary | ICD-10-CM | POA: Diagnosis not present

## 2024-01-28 DIAGNOSIS — I10 Essential (primary) hypertension: Secondary | ICD-10-CM

## 2024-01-28 DIAGNOSIS — D631 Anemia in chronic kidney disease: Secondary | ICD-10-CM | POA: Diagnosis present

## 2024-01-28 DIAGNOSIS — I130 Hypertensive heart and chronic kidney disease with heart failure and stage 1 through stage 4 chronic kidney disease, or unspecified chronic kidney disease: Secondary | ICD-10-CM | POA: Diagnosis present

## 2024-01-28 DIAGNOSIS — J9611 Chronic respiratory failure with hypoxia: Secondary | ICD-10-CM | POA: Diagnosis not present

## 2024-01-28 DIAGNOSIS — Z6841 Body Mass Index (BMI) 40.0 and over, adult: Secondary | ICD-10-CM | POA: Diagnosis not present

## 2024-01-28 DIAGNOSIS — I5032 Chronic diastolic (congestive) heart failure: Secondary | ICD-10-CM | POA: Diagnosis present

## 2024-01-28 LAB — ECHOCARDIOGRAM COMPLETE
Height: 66 in
S' Lateral: 2.8 cm
Weight: 4640 [oz_av]

## 2024-01-28 LAB — URINALYSIS, ROUTINE W REFLEX MICROSCOPIC
Bilirubin Urine: NEGATIVE
Glucose, UA: NEGATIVE mg/dL
Ketones, ur: NEGATIVE mg/dL
Nitrite: NEGATIVE
Protein, ur: 30 mg/dL — AB
Specific Gravity, Urine: 1.014 (ref 1.005–1.030)
pH: 5 (ref 5.0–8.0)

## 2024-01-28 LAB — DIC (DISSEMINATED INTRAVASCULAR COAGULATION)PANEL
D-Dimer, Quant: 0.5 ug{FEU}/mL (ref 0.00–0.50)
Fibrinogen: 341 mg/dL (ref 210–475)
INR: 1.9 — ABNORMAL HIGH (ref 0.8–1.2)
Platelets: 188 K/uL (ref 150–400)
Prothrombin Time: 22.3 s — ABNORMAL HIGH (ref 11.4–15.2)
Smear Review: NONE SEEN
aPTT: 25 s (ref 24–36)

## 2024-01-28 LAB — CBC WITH DIFFERENTIAL/PLATELET
Abs Immature Granulocytes: 0.39 K/uL — ABNORMAL HIGH (ref 0.00–0.07)
Basophils Absolute: 0 K/uL (ref 0.0–0.1)
Basophils Relative: 0 %
Eosinophils Absolute: 0 K/uL (ref 0.0–0.5)
Eosinophils Relative: 0 %
HCT: 32.1 % — ABNORMAL LOW (ref 36.0–46.0)
Hemoglobin: 10.6 g/dL — ABNORMAL LOW (ref 12.0–15.0)
Immature Granulocytes: 2 %
Lymphocytes Relative: 4 %
Lymphs Abs: 0.8 K/uL (ref 0.7–4.0)
MCH: 31.6 pg (ref 26.0–34.0)
MCHC: 33 g/dL (ref 30.0–36.0)
MCV: 95.8 fL (ref 80.0–100.0)
Monocytes Absolute: 1.3 K/uL — ABNORMAL HIGH (ref 0.1–1.0)
Monocytes Relative: 6 %
Neutro Abs: 19.2 K/uL — ABNORMAL HIGH (ref 1.7–7.7)
Neutrophils Relative %: 88 %
Platelets: 194 K/uL (ref 150–400)
RBC: 3.35 MIL/uL — ABNORMAL LOW (ref 3.87–5.11)
RDW: 13.2 % (ref 11.5–15.5)
WBC: 21.7 K/uL — ABNORMAL HIGH (ref 4.0–10.5)
nRBC: 0 % (ref 0.0–0.2)

## 2024-01-28 LAB — HEPATIC FUNCTION PANEL
ALT: 15 U/L (ref 0–44)
AST: 21 U/L (ref 15–41)
Albumin: 3 g/dL — ABNORMAL LOW (ref 3.5–5.0)
Alkaline Phosphatase: 42 U/L (ref 38–126)
Bilirubin, Direct: 0.2 mg/dL (ref 0.0–0.2)
Indirect Bilirubin: 0.9 mg/dL (ref 0.3–0.9)
Total Bilirubin: 1.1 mg/dL (ref 0.0–1.2)
Total Protein: 5.8 g/dL — ABNORMAL LOW (ref 6.5–8.1)

## 2024-01-28 LAB — HEMOGLOBIN A1C
Hgb A1c MFr Bld: 6.4 % — ABNORMAL HIGH (ref 4.8–5.6)
Mean Plasma Glucose: 136.98 mg/dL

## 2024-01-28 LAB — BASIC METABOLIC PANEL WITH GFR
Anion gap: 16 — ABNORMAL HIGH (ref 5–15)
BUN: 129 mg/dL — ABNORMAL HIGH (ref 8–23)
CO2: 19 mmol/L — ABNORMAL LOW (ref 22–32)
Calcium: 9.8 mg/dL (ref 8.9–10.3)
Chloride: 99 mmol/L (ref 98–111)
Creatinine, Ser: 3.27 mg/dL — ABNORMAL HIGH (ref 0.44–1.00)
GFR, Estimated: 14 mL/min — ABNORMAL LOW (ref >60)
Glucose, Bld: 240 mg/dL — ABNORMAL HIGH (ref 70–99)
Potassium: 4 mmol/L (ref 3.5–5.1)
Sodium: 134 mmol/L — ABNORMAL LOW (ref 135–145)

## 2024-01-28 LAB — RESPIRATORY PANEL BY PCR

## 2024-01-28 LAB — MAGNESIUM: Magnesium: 2.1 mg/dL (ref 1.7–2.4)

## 2024-01-28 LAB — PHOSPHORUS: Phosphorus: 5.2 mg/dL — ABNORMAL HIGH (ref 2.5–4.6)

## 2024-01-28 LAB — TSH: TSH: 0.782 u[IU]/mL (ref 0.350–4.500)

## 2024-01-28 LAB — IRON AND TIBC
Iron: 98 ug/dL (ref 28–170)
Saturation Ratios: 29 % (ref 10.4–31.8)
TIBC: 335 ug/dL (ref 250–450)
UIBC: 237 ug/dL

## 2024-01-28 LAB — BLOOD GAS, VENOUS
Acid-base deficit: 3.1 mmol/L — ABNORMAL HIGH (ref 0.0–2.0)
Bicarbonate: 21.2 mmol/L (ref 20.0–28.0)
O2 Saturation: 92.8 %
Patient temperature: 37.1
pCO2, Ven: 35 mmHg — ABNORMAL LOW (ref 44–60)
pH, Ven: 7.39 (ref 7.25–7.43)
pO2, Ven: 61 mmHg — ABNORMAL HIGH (ref 32–45)

## 2024-01-28 LAB — FERRITIN: Ferritin: 408 ng/mL — ABNORMAL HIGH (ref 11–307)

## 2024-01-28 LAB — AMMONIA: Ammonia: 30 umol/L (ref 9–35)

## 2024-01-28 LAB — FOLATE: Folate: 13.5 ng/mL (ref >5.9)

## 2024-01-28 LAB — GLUCOSE, CAPILLARY
Glucose-Capillary: 185 mg/dL — ABNORMAL HIGH (ref 70–99)
Glucose-Capillary: 195 mg/dL — ABNORMAL HIGH (ref 70–99)
Glucose-Capillary: 223 mg/dL — ABNORMAL HIGH (ref 70–99)

## 2024-01-28 LAB — CBG MONITORING, ED
Glucose-Capillary: 229 mg/dL — ABNORMAL HIGH (ref 70–99)
Glucose-Capillary: 263 mg/dL — ABNORMAL HIGH (ref 70–99)
Glucose-Capillary: 221 mg/dL — ABNORMAL HIGH (ref 70–99)

## 2024-01-28 LAB — TROPONIN I (HIGH SENSITIVITY): Troponin I (High Sensitivity): 88 ng/L — ABNORMAL HIGH (ref <18)

## 2024-01-28 LAB — VITAMIN B12: Vitamin B-12: 497 pg/mL (ref 180–914)

## 2024-01-28 LAB — RETICULOCYTES
Immature Retic Fract: 4.9 % (ref 2.3–15.9)
RBC.: 3.32 MIL/uL — ABNORMAL LOW (ref 3.87–5.11)
Retic Count, Absolute: 42.5 K/uL (ref 19.0–186.0)
Retic Ct Pct: 1.3 % (ref 0.4–3.1)

## 2024-01-28 LAB — SODIUM, URINE, RANDOM: Sodium, Ur: <10 mmol/L

## 2024-01-28 LAB — CREATININE, URINE, RANDOM: Creatinine, Urine: 68 mg/dL

## 2024-01-28 LAB — CK: Total CK: 98 U/L (ref 38–234)

## 2024-01-28 MED ORDER — LACTATED RINGERS IV SOLN: INTRAVENOUS | Status: AC

## 2024-01-28 MED ORDER — SODIUM CHLORIDE 0.9 % IV SOLN
2.0000 g | INTRAVENOUS | Status: DC
  Administered 2024-01-28 – 2024-01-29 (×2): 2 g via INTRAVENOUS
  Filled 2024-01-28 (×2): qty 12.5

## 2024-01-28 MED ORDER — LINEZOLID 600 MG/300ML IV SOLN
600.0000 mg | Freq: Two times a day (BID) | INTRAVENOUS | Status: DC
  Administered 2024-01-28 (×2): 600 mg via INTRAVENOUS
  Filled 2024-01-28 (×3): qty 300

## 2024-01-28 MED ORDER — THIAMINE HCL 100 MG/ML IJ SOLN
100.0000 mg | Freq: Every day | INTRAMUSCULAR | Status: DC
  Administered 2024-01-28 – 2024-02-01 (×5): 100 mg via INTRAVENOUS
  Filled 2024-01-28 (×5): qty 2

## 2024-01-28 MED ORDER — PANTOPRAZOLE SODIUM 40 MG IV SOLR
40.0000 mg | Freq: Two times a day (BID) | INTRAVENOUS | Status: DC
  Administered 2024-01-28 – 2024-02-01 (×9): 40 mg via INTRAVENOUS
  Filled 2024-01-28 (×9): qty 10

## 2024-01-28 MED ORDER — ONDANSETRON HCL 4 MG/2ML IJ SOLN
4.0000 mg | Freq: Four times a day (QID) | INTRAMUSCULAR | Status: DC | PRN
  Administered 2024-01-28: 4 mg via INTRAVENOUS
  Filled 2024-01-28: qty 2

## 2024-01-28 MED ORDER — CHLORHEXIDINE GLUCONATE CLOTH 2 % EX PADS
6.0000 | MEDICATED_PAD | Freq: Every day | CUTANEOUS | Status: DC
  Administered 2024-01-29 – 2024-01-30 (×2): 6 via TOPICAL

## 2024-01-28 MED ORDER — INSULIN ASPART 100 UNIT/ML IJ SOLN
0.0000 [IU] | INTRAMUSCULAR | Status: DC
  Administered 2024-01-28: 2 [IU] via SUBCUTANEOUS
  Administered 2024-01-28: 1 [IU] via SUBCUTANEOUS
  Administered 2024-01-28 (×2): 2 [IU] via SUBCUTANEOUS
  Administered 2024-01-28: 3 [IU] via SUBCUTANEOUS
  Administered 2024-01-29 – 2024-01-30 (×7): 1 [IU] via SUBCUTANEOUS
  Administered 2024-01-30: 2 [IU] via SUBCUTANEOUS
  Administered 2024-01-31: 1 [IU] via SUBCUTANEOUS
  Administered 2024-02-01: 3 [IU] via SUBCUTANEOUS
  Administered 2024-02-01: 2 [IU] via SUBCUTANEOUS

## 2024-01-28 NOTE — Consult Note (Signed)
 NAME:  [name], MRN:  [id number], DOB:  [birth year], LOS: 0 ADMISSION DATE:  01/27/2024, CONSULTATION DATE:  8/15 REFERRING MD:  Dr. Franky, CHIEF COMPLAINT:  hematuria/rectal bleeding; encephalopathy   History of Present Illness:  Patient is a 78 year old female with pertinent PMH chronic respiratory failure on 3L [place], chronic diastolic CHF, DVT on Eliquis, hypothyroidism, HTN, dementia presents to [facility] on 8/15 with hematuria/rectal bleeding.  Patient recently admitted to [facility] with E. coli bacteremia and COVID.  Discharged on 01/17/2024.  Was sent to rehab facility on a course of Levaquin.  On 8/15 patient having bleeding from Foley catheter and rectum.  Brought to [facility] ED for further eval.  On arrival patient altered.  Sats stable on 4L [place].  Hgb 11.6, INR 1.9, PTT 23.  Afebrile but WBC 22.  CT chest/abdomen/pelvis no acute abnormality.  LA WNL.  Given IV fluids, cultures obtained, and placed on broad-spectrum antibiotics.  Troponins mildly elevated 84 then 93.  Creat 3.09 and BUN 127.  Nephro consulted.  CT head no acute abnormality.  Ammonia, TSH, VBG WNL.  TRH to admit.  Given encephalopathy PCCM consulted.  Pertinent  Medical History   Past Medical History:  Diagnosis Date   Benign positional vertigo 03/04/2016   Brain injury [facility])    CHF (congestive heart failure) (HCC)    Climacteric    Depression    DJD (degenerative joint disease)    lumbar and cervical   Dyslipidemia    GERD (gastroesophageal reflux disease)    Hypertension    Hypokalemia    Hypothyroidism    IBS (irritable bowel syndrome)    OAB (overactive bladder)    Ovarian cyst 03/25/2015   3cm simple cyst on right ovary   Rhinitis    Sleep apnea    Spasm of thoracolumbar muscle      Significant Hospital Events: Including procedures, antibiotic start and stop dates in addition to other pertinent events   8/15 admit  Interim History / Subjective:  See above  Objective    Blood  pressure (!) 146/133, pulse 71, temperature 98.5 F (36.9 C), temperature source Axillary, resp. rate 11, height 5' 6 (1.676 m), weight 131.5 kg, SpO2 97%.        Intake/Output Summary (Last 24 hours) at 01/28/2024 0524 Last data filed at 01/28/2024 0415 Gross per 24 hour  Intake 1066.67 ml  Output --  Net 1066.67 ml   Filed Weights   01/27/24 1719  Weight: 131.5 kg    Examination: General:   ill appearing female with NAD HEENT: MM pink/moist;  in place Neuro: Somnolent but arousable; Aox3; MAE to command CV: s1s2, no m/r/g PULM:  dim BS bilaterally;  4L sats 100% GI: soft, bsx4 active  Extremities: warm/dry, multiple bruises on UE and LE   Resolved problem list   Assessment and Plan   Acute encephalopathy: Possibly sepsis related; BUN elevated -ct head no acute abnormality; vbg, ammonia, tsh wnl Hx of dementia Possible sepsis Plan: - Currently appears stable for progressive; PCCM available as needed - Limit sedating meds - Resume home dementia meds when able to take p.o. 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: rising BUN Hyponatremia Plan: -given iv fluids -renal us  -nephro consulted -Trend BMP / urinary output -Replace electrolytes as indicated -Avoid nephrotoxic agents, ensure adequate renal perfusion  Chronic respiratory failure on 3 LNC Hx of  OSA: unsure if used cpap in past Plan: - Continue [place] for sats greater than 92% -Not a good candidate for CPAP with high aspiration risk - pulm toilet  Hematuria/rectal bleeding Hx of DVT Hypothyroidism Diastolic CHF Hyperglycemia Depression HTN Plan: - Per primary   Best Practice (right click and Reselect all SmartList Selections daily)   Per primary  Labs   CBC: Recent Labs  Lab 01/27/24 1631 01/27/24 1723 01/27/24 1750 01/28/24 0409 01/28/24 0410  WBC  --  22.2*   --  21.7*  --   NEUTROABS  --  20.3*  --  19.2*  --   HGB 10.2* 11.6* 11.6* 10.6*  --   HCT 30.0* 33.6* 34.0* 32.1*  --   MCV  --  92.1  --  95.8  --   PLT  --  204  --  194 188    Basic Metabolic Panel: Recent Labs  Lab 01/27/24 1631 01/27/24 1723 01/27/24 1750 01/28/24 0409  NA 132* 131* 132* 134*  K 4.9 4.3 4.1 4.0  CL 97* 95*  --  99  CO2  --  21*  --  19*  GLUCOSE 232* 241*  --  240*  BUN 126* 127*  --  129*  CREATININE 3.60* 3.09*  --  3.27*  CALCIUM  --  10.3  --  9.8  MG  --   --   --  2.1  PHOS  --   --   --  5.2*   GFR: Estimated Creatinine Clearance: 19.7 mL/min (A) (by C-G formula based on SCr of 3.27 mg/dL (H)). Recent Labs  Lab 01/27/24 1631 01/27/24 1723 01/27/24 1845 01/28/24 0409  WBC  --  22.2*  --  21.7*  LATICACIDVEN 1.7  --  1.3  --     Liver Function Tests: Recent Labs  Lab 01/27/24 1723 01/28/24 0409  AST 26 21  ALT 17 15  ALKPHOS 43 42  BILITOT 0.9 1.1  PROT 6.3* 5.8*  ALBUMIN  3.3* 3.0*   No results for input(s): LIPASE, AMYLASE in the last 168 hours. Recent Labs  Lab 01/27/24 2343  AMMONIA 30    ABG    Component Value Date/Time   HCO3 21.2 01/28/2024 0010   TCO2 23 01/27/2024 1750   ACIDBASEDEF 3.1 (H) 01/28/2024 0010   O2SAT 92.8 01/28/2024 0010     Coagulation Profile: Recent Labs  Lab 01/27/24 1723 01/28/24 0410  INR 1.9* 1.9*    Cardiac Enzymes: Recent Labs  Lab 01/28/24 0409  CKTOTAL 98    HbA1C: Hgb A1c MFr Bld  Date/Time Value Ref Range Status  01/28/2024 04:09 AM 6.4 (H) 4.8 - 5.6 % Final    Comment:    (NOTE) Diagnosis of Diabetes The following HbA1c ranges recommended by the American Diabetes Association (ADA) may be used as an aid in the diagnosis of diabetes mellitus.  Hemoglobin             Suggested A1C NGSP%              Diagnosis  <5.7                   Non Diabetic  5.7-6.4                Pre-Diabetic  >6.4                   Diabetic  <7.0  Glycemic  control for                       adults with diabetes.      CBG: Recent Labs  Lab 01/28/24 0349  GLUCAP 229*    Review of Systems:   Patient is encephalopathic; therefore, history has been obtained from chart review.    Past Medical History:  She,  has a past medical history of Benign positional vertigo (03/04/2016), Brain injury (HCC), CHF (congestive heart failure) (HCC), Climacteric, Depression, DJD (degenerative joint disease), Dyslipidemia, GERD (gastroesophageal reflux disease), Hypertension, Hypokalemia, Hypothyroidism, IBS (irritable bowel syndrome), OAB (overactive bladder), Ovarian cyst (03/25/2015), Rhinitis, Sleep apnea, and Spasm of thoracolumbar muscle.   Surgical History:   Past Surgical History:  Procedure Laterality Date   ABDOMINAL HYSTERECTOMY  07/12/2014   ANKLE RECONSTRUCTION Left    BRACHIOPLASTY     BREAST REDUCTION SURGERY Bilateral 2020   CATARACT EXTRACTION     CHOLECYSTECTOMY     COLONOSCOPY WITH PROPOFOL  N/A 12/07/2016   Procedure: COLONOSCOPY WITH PROPOFOL ;  Surgeon: Viktoria Lamar DASEN, MD;  Location: [facility] ENDOSCOPY;  Service: Endoscopy;  Laterality: N/A;   JOINT REPLACEMENT Bilateral 07/2014, 03/2015   nasal reconstrucion     REDUCTION MAMMAPLASTY Bilateral    SHOULDER OPEN ROTATOR CUFF REPAIR     SOFT TISSUE TUMOR RESECTION     excision tumor soft tissue neck, and soft tissue subcutaneous axilla   TOE SURGERY Right    reconstruction right foot 4th and 5th toe     Social History:   reports that she has never smoked. She has never used smokeless tobacco. She reports current alcohol  use of about 2.0 standard drinks of alcohol  per week. She reports that she does not use drugs.   Family History:  Her family history includes Breast cancer in her paternal aunt; Lung cancer in her father and mother.   Allergies Allergies  Allergen Reactions   Codeine Other (See Comments)    Seizure. Tolerates tramadol    Povidone Iodine Shortness Of Breath     Patient had difficulty breathing during procedure where dura prep was used to clean back.    Hydromorphone Hives, Itching and Other (See Comments)    Severe itching hallucinations   Latex Itching and Other (See Comments)    blisters   Oxycodone-Acetaminophen  Nausea And Vomiting and Itching   Sulfa Antibiotics Itching and Swelling    Swelling eyes, hands, feet. Was at home and didn't go to the hospital   Bupropion Other (See Comments)    Unknown reaction   Gabapentin Other (See Comments)    tremors   Hydrocodone     Tolerates tramadol    Morphine  And Codeine Itching and Rash   Procaine      Home Medications  Prior to Admission medications   Medication Sig Start Date End Date Taking? Authorizing Provider  acetaminophen  (TYLENOL ) 325 MG tablet Take 650 mg by mouth every 6 (six) hours as needed for mild pain (pain score 1-3).   Yes [provider]  Amantadine  HCl 100 MG tablet Take 100 mg by mouth 2 (two) times daily. 03/21/23  Yes [provider]  amLODipine  (NORVASC ) 5 MG tablet Take 5 mg by mouth daily.   Yes [provider]  Apixaban (ELIQUIS DVT/PE STARTER PACK PO) Take 5 mg by mouth in the morning and at bedtime.   Yes [provider]  ascorbic acid  (VITAMIN C ) 500 MG tablet Take 500 mg by mouth daily.  Yes [provider]  baclofen  (LIORESAL ) 20 MG tablet Take 20 mg by mouth daily as needed for muscle spasms.   Yes [provider]  bumetanide (BUMEX) 1 MG tablet Take 1 mg by mouth 2 (two) times daily.   Yes [provider]  carvedilol  (COREG ) 25 MG tablet Take 50 mg by mouth 2 (two) times daily with a meal.   Yes [provider]  dexamethasone  (DECADRON ) 4 MG tablet Take 6 mg by mouth daily.   Yes [provider]  donepezil  (ARICEPT ) 10 MG tablet Take 10 mg by mouth daily.   Yes [provider]  ferrous sulfate 325 (65 FE) MG tablet Take 325 mg by mouth daily with breakfast.   Yes [provider]  fluconazole (DIFLUCAN) 100 MG tablet Take 100 mg by mouth daily. For 7 days   Yes [provider]  furosemide  (LASIX ) 20 MG tablet Take 20 mg by mouth every morning.   Yes [provider]  hydrOXYzine (ATARAX) 25 MG tablet Take 25 mg by mouth every 6 (six) hours as needed for itching. 10/18/23  Yes [provider]  ipratropium-albuterol  (DUONEB) 0.5-2.5 (3) MG/3ML SOLN Take 3 mLs by nebulization every 4 (four) hours as needed (for congestion/wheezing).   Yes [provider]  levofloxacin (LEVAQUIN) 750 MG tablet Take 750 mg by mouth daily. For 14 days   Yes [provider]  levothyroxine  (SYNTHROID , LEVOTHROID) 112 MCG tablet Take 112 mcg by mouth daily.   Yes [provider]  losartan  (COZAAR ) 100 MG tablet Take 100 mg by mouth daily.   Yes [provider]  montelukast  (SINGULAIR ) 10 MG tablet Take 10 mg by mouth every evening.   Yes [provider]  nystatin  (MYCOSTATIN ) 100000 UNIT/ML suspension Take 5 mLs by mouth in the morning, at noon, and at bedtime. For 10 days   Yes [provider]  nystatin  (MYCOSTATIN /NYSTOP ) powder Apply topically 2 (two) times daily. Patient taking differently: Apply 1 Application topically 2 (two) times daily as needed. 07/06/20  Yes Von Bellis, MD  omeprazole (PRILOSEC) 20 MG capsule Take 20 mg by mouth daily.   Yes [provider]  saccharomyces boulardii (FLORASTOR) 250 MG capsule Take 250 mg by mouth daily.   Yes [provider]  spironolactone  (ALDACTONE ) 25 MG tablet Take 25 mg by mouth daily. 10/15/22  Yes [provider]  topiramate  (TOPAMAX ) 25 MG tablet Take 25 mg by mouth daily.   Yes [provider]  traZODone  (DESYREL ) 50 MG tablet Take 50 mg by mouth at bedtime.   Yes [provider]  venlafaxine  XR (EFFEXOR -XR) 75 MG 24 hr capsule Take 75 mg by mouth daily with breakfast. 10/17/22  Yes [provider]      Critical care time: NA     RODGER Emilio DEVONNA [facility] 01/28/2024, 5:24 AM  Please see [email or web address] for pager details.  From 7A-7P if no response, please call [phone]. After hours, please call ELink [phone].

## 2024-01-28 NOTE — ED Notes (Signed)
 Attempted to scan bladder 3x, and was unsucessful. First time I only received 0 mL after finding bladder.

## 2024-01-28 NOTE — ED Notes (Signed)
 Patient transported to CT

## 2024-01-28 NOTE — H&P (Addendum)
 History and Physical    [name] FMW:[id number] DOB: 02/12/1946 DOA: 01/27/2024  Patient coming from: Patient was transferred from [facility].  Chief Complaint: Bleeding from rectum and Foley catheter.  Increased BUN.  HPI: [name] is a 78 y.o. female with history of hypothyroidism, chronic respiratory failure at baseline on 3 L oxygen with history of chronic diastolic CHF, DVT on Eliquis, hypertension, GERD, dementia who was recently admitted to the hospital in [place]  for E. coli bacteremia sepsis in the setting of COVID infection was discharged on January 17, 2024 as per the discharge summary on Levaquin to complete the course for the E. coli bacteremia not sure how long the patient was on Levaquin was transferred to [facility] ER after patient was noticed to have bleeding from the rectum and Foley catheter.  I tried to reach patient's daughter was unable to reach.  I talked with the staff at the rehab they said they transferred because of the bleeding and increased BUN.  I reviewed the patient's limited discharge summary available and was able to obtain the above information.  Discharge medication list shows that patient was on amantadine  baclofen  and donezepil Effexor  spironolactone  carvedilol  levothyroxine  topiramate  amlodipine  omeprazole montelukast  albuterol  ferrous sulfate levofloxacin.  ED Course: In the ER patient appears encephalopathic oriented to her name moving all extremities and following commands.  Multiple bruises on all extremities.  Labs show BUN of 127 bicarb of 21 creatinine of 3 sodium 131.  The last creatinine available in our system was 1.35 in October 2024.  Troponin is mildly elevated at 84 EKG shows LBBB BNP is 94.4.  WBC count is 22.2 hemoglobin 11.6 platelets 204 INR 1.9.  Stool for occult blood was positive.  Blood cultures were obtained.  CT chest abdomen pelvis was unremarkable.  VBG shows pH of 7.40 pCO2 of 35.4.   Patient admitted for acute renal failure with bleeding and worsening encephalopathy.  Review of Systems: As per HPI, rest all negative.   Past Medical History:  Diagnosis Date   Benign positional vertigo 03/04/2016   Brain injury [facility])    CHF (congestive heart failure) (HCC)    Climacteric    Depression    DJD (degenerative joint disease)    lumbar and cervical   Dyslipidemia    GERD (gastroesophageal reflux disease)    Hypertension    Hypokalemia    Hypothyroidism    IBS (irritable bowel syndrome)    OAB (overactive bladder)    Ovarian cyst 03/25/2015   3cm simple cyst on right ovary   Rhinitis    Sleep apnea    Spasm of thoracolumbar muscle     Past Surgical History:  Procedure Laterality Date   ABDOMINAL HYSTERECTOMY  07/12/2014   ANKLE RECONSTRUCTION Left    BRACHIOPLASTY     BREAST REDUCTION SURGERY Bilateral 2020   CATARACT EXTRACTION     CHOLECYSTECTOMY     COLONOSCOPY WITH PROPOFOL  N/A 12/07/2016   Procedure: COLONOSCOPY WITH PROPOFOL ;  Surgeon: Viktoria Lamar DASEN, MD;  Location: [facility] ENDOSCOPY;  Service: Endoscopy;  Laterality: N/A;   JOINT REPLACEMENT Bilateral 07/2014, 03/2015   nasal reconstrucion     REDUCTION MAMMAPLASTY Bilateral    SHOULDER OPEN ROTATOR CUFF REPAIR     SOFT TISSUE TUMOR RESECTION     excision tumor soft tissue neck, and soft tissue subcutaneous axilla   TOE SURGERY Right    reconstruction right foot 4th and 5th toe     reports that  she has never smoked. She has never used smokeless tobacco. She reports current alcohol  use of about 2.0 standard drinks of alcohol  per week. She reports that she does not use drugs.  Allergies  Allergen Reactions   Codeine Other (See Comments)    Seizure. Tolerates tramadol    Povidone Iodine Shortness Of Breath    Patient had difficulty breathing during procedure where dura prep was used to clean back.    Hydromorphone Hives, Itching and Other (See Comments)    Severe itching hallucinations   Latex  Itching and Other (See Comments)    blisters   Oxycodone-Acetaminophen  Nausea And Vomiting and Itching   Sulfa Antibiotics Itching and Swelling    Swelling eyes, hands, feet. Was at home and didn't go to the hospital   Bupropion Other (See Comments)    Unknown reaction   Gabapentin Other (See Comments)    tremors   Hydrocodone     Tolerates tramadol    Morphine  And Codeine Itching and Rash   Procaine     Family History  Problem Relation Age of Onset   Lung cancer Mother    Lung cancer Father    Breast cancer Paternal Aunt     Prior to Admission medications   Medication Sig Start Date End Date Taking? Authorizing Provider  Amantadine  HCl 100 MG tablet Take 100 mg by mouth 2 (two) times daily. 03/21/23  Yes [provider]  furosemide  (LASIX ) 20 MG tablet Take 20 mg by mouth every morning.   Yes [provider]  hydrOXYzine (ATARAX) 25 MG tablet Take 25 mg by mouth every 6 (six) hours as needed for itching. 10/18/23  Yes [provider]  acetaminophen  (TYLENOL ) 500 MG tablet Take 500 mg by mouth every 6 (six) hours as needed.    [provider]  amLODipine  (NORVASC ) 5 MG tablet Take 5 mg by mouth daily.    [provider]  baclofen  (LIORESAL ) 20 MG tablet Take 20 mg by mouth daily as needed for muscle spasms.    [provider]  carvedilol  (COREG ) 25 MG tablet Take 50 mg by mouth 2 (two) times daily with a meal.    [provider]  Cholecalciferol  (VITAMIN D3) 2000 units TABS Take 2,000 Units daily by mouth.    [provider]  donepezil  (ARICEPT ) 10 MG tablet Take 10 mg by mouth daily.    [provider]  esomeprazole (NEXIUM) 40 MG capsule Take 40 mg by mouth daily.    [provider]  fluticasone  (FLONASE) 50 MCG/ACT nasal spray Place 1 spray into both nostrils daily as needed for rhinitis or allergies.    [provider]  folic acid  (FOLVITE ) 400 MCG tablet Take 400 mcg by mouth daily.     [provider]  levothyroxine  (SYNTHROID , LEVOTHROID) 112 MCG tablet Take 112 mcg by mouth daily.    [provider]  losartan  (COZAAR ) 100 MG tablet Take 100 mg by mouth daily.    [provider]  Melatonin 3 MG TABS Take 1 tablet at bedtime by mouth.     [provider]  montelukast  (SINGULAIR ) 10 MG tablet Take 10 mg by mouth every evening.    [provider]  Multiple Vitamin (MULTIVITAMIN) tablet Take 1 tablet by mouth daily.    [provider]  nystatin  (MYCOSTATIN /NYSTOP ) powder Apply topically 2 (two) times daily. Patient taking differently: Apply 1 Application topically 2 (two) times daily as needed. 07/06/20   Von Bellis, MD  spironolactone  (  ALDACTONE ) 25 MG tablet Take 25 mg by mouth daily. 10/15/22   [provider]  topiramate  (TOPAMAX ) 25 MG tablet Take 25 mg by mouth daily.    [provider]  torsemide  (DEMADEX ) 20 MG tablet Take 20 mg by mouth daily. Takes 10mg  unless ankle is swelling, then takes 20mg     [provider]  traZODone  (DESYREL ) 50 MG tablet Take 50 mg by mouth at bedtime. 50-150mg  nightly as needed    [provider]  venlafaxine  XR (EFFEXOR -XR) 75 MG 24 hr capsule Take 150 mg by mouth 2 (two) times daily. 10/17/22   [provider]    Physical Exam: Constitutional: Moderately built and nourished. Vitals:   01/27/24 2045 01/27/24 2120 01/27/24 2300 01/28/24 0136  BP: 108/75 117/63 (!) 146/133   Pulse:  68 71   Resp: 14 13 11    Temp:    98.5 F (36.9 C)  TempSrc:    Axillary  SpO2:  98% 97%   Weight:      Height:       Eyes: Anicteric no pallor. ENMT: No discharge from the ears eyes nose or mouth. Neck: No mass felt.  No neck rigidity. Respiratory: No rhonchi or crepitations. Cardiovascular: S1-S2 heard. Abdomen: Soft nontender bowel sound present. Musculoskeletal: Several bruises all extremities. Skin: Multiple bruises on all extremities. Neurologic:  Lethargic oriented to her name follows commands moving all extremities. Psychiatric: Lethargic.   Labs on Admission: I have personally reviewed following labs and imaging studies  CBC: Recent Labs  Lab 01/27/24 1631 01/27/24 1723 01/27/24 1750  WBC  --  22.2*  --   NEUTROABS  --  20.3*  --   HGB 10.2* 11.6* 11.6*  HCT 30.0* 33.6* 34.0*  MCV  --  92.1  --   PLT  --  204  --    Basic Metabolic Panel: Recent Labs  Lab 01/27/24 1631 01/27/24 1723 01/27/24 1750  NA 132* 131* 132*  K 4.9 4.3 4.1  CL 97* 95*  --   CO2  --  21*  --   GLUCOSE 232* 241*  --   BUN 126* 127*  --   CREATININE 3.60* 3.09*  --   CALCIUM  --  10.3  --    GFR: Estimated Creatinine Clearance: 20.9 mL/min (A) (by C-G formula based on SCr of 3.09 mg/dL (H)). Liver Function Tests: Recent Labs  Lab 01/27/24 1723  AST 26  ALT 17  ALKPHOS 43  BILITOT 0.9  PROT 6.3*  ALBUMIN  3.3*   No results for input(s): LIPASE, AMYLASE in the last 168 hours. Recent Labs  Lab 01/27/24 2343  AMMONIA 30   Coagulation Profile: Recent Labs  Lab 01/27/24 1723  INR 1.9*   Cardiac Enzymes: No results for input(s): CKTOTAL, CKMB, CKMBINDEX, TROPONINI in the last 168 hours. BNP (last 3 results) No results for input(s): PROBNP in the last 8760 hours. HbA1C: No results for input(s): HGBA1C in the last 72 hours. CBG: No results for input(s): GLUCAP in the last 168 hours. Lipid Profile: No results for input(s): CHOL, HDL, LDLCALC, TRIG, CHOLHDL, LDLDIRECT in the last 72 hours. Thyroid Function Tests: Recent Labs    01/27/24 2343  TSH 0.782   Anemia Panel: Recent Labs    01/27/24 2343  VITAMINB12 497  FOLATE 13.5  FERRITIN 408*  TIBC 335  IRON 98   Urine analysis:    Component Value Date/Time   COLORURINE YELLOW (A) 03/22/2023 1454   APPEARANCEUR HAZY (A) 03/22/2023  1454   LABSPEC 1.013 03/22/2023 1454   PHURINE 7.0 03/22/2023 1454   GLUCOSEU NEGATIVE 03/22/2023  1454   HGBUR NEGATIVE 03/22/2023 1454   BILIRUBINUR NEGATIVE 03/22/2023 1454   KETONESUR NEGATIVE 03/22/2023 1454   PROTEINUR NEGATIVE 03/22/2023 1454   NITRITE POSITIVE (A) 03/22/2023 1454   LEUKOCYTESUR MODERATE (A) 03/22/2023 1454   Sepsis Labs: @LABRCNTIP (procalcitonin:4,lacticidven:4) )No results found for this or any previous visit (from the past 240 hours).   Radiological Exams on Admission: CT CHEST ABDOMEN PELVIS WO CONTRAST Result Date: 01/28/2024 CLINICAL DATA:  Chest and abdominal pain EXAM: CT CHEST, ABDOMEN AND PELVIS WITHOUT CONTRAST TECHNIQUE: Multidetector CT imaging of the chest, abdomen and pelvis was performed following the standard protocol without IV contrast. RADIATION DOSE REDUCTION: This exam was performed according to the departmental dose-optimization program which includes automated exposure control, adjustment of the mA and/or kV according to patient size and/or use of iterative reconstruction technique. COMPARISON:  Chest x-ray from the previous day. FINDINGS: CT CHEST FINDINGS Cardiovascular: Somewhat limited due to lack of IV contrast. Atherosclerotic calcification of the aorta is noted without aneurysmal dilatation. No cardiac enlargement is seen. No abnormality in the right perihilar region is noted to correspond with that seen on prior chest x-ray. This was felt to have been related to patient rotation to the right. Mediastinum/Nodes: Thoracic inlet is within normal limits. No hilar or mediastinal adenopathy is noted. The esophagus as visualized is within normal limits. Lungs/Pleura: Lungs are well aerated bilaterally. No focal infiltrate or effusion is seen. No parenchymal nodules are noted. Musculoskeletal: Degenerative changes of the thoracic spine are seen. No acute rib abnormality is noted. CT ABDOMEN PELVIS FINDINGS Hepatobiliary: No focal liver abnormality is seen. Status post cholecystectomy. No biliary dilatation. Pancreas: Unremarkable. No pancreatic ductal  dilatation or surrounding inflammatory changes. Spleen: Normal in size without focal abnormality. Adrenals/Urinary Tract: Adrenal glands are within normal limits. Kidneys are well visualized bilaterally. No renal calculi or obstructive changes are seen. Renal arterial calcifications are noted. The bladder is decompressed. Stomach/Bowel: Fecal material is noted scattered throughout the colon. Minimal diverticular changes seen. No diverticulitis is noted. The appendix is within normal limits. Small bowel and stomach are unremarkable. Vascular/Lymphatic: Aortic atherosclerosis. No enlarged abdominal or pelvic lymph nodes. Reproductive: Status post hysterectomy. Simple appearing right adnexal cyst measuring 5.9 cm. Other: No abdominal wall hernia or abnormality. No abdominopelvic ascites. Musculoskeletal: Degenerative change of the lumbar spine is noted. No acute bony abnormality is seen. IMPRESSION: CT of the chest: No acute abnormality to correspond with that seen on recent chest x-ray. CT of the abdomen and pelvis: Diverticulosis without diverticulitis. Right adnexal simple-appearing cyst measuring 5.9 cm. Recommend follow-up pelvic ultrasound in 6-12 months. Reference: JACR 2020 Feb;17(2):248-254 Electronically Signed   By: Oneil Devonshire M.D.   On: 01/28/2024 01:05   DG Chest Port 1 View Result Date: 01/27/2024 CLINICAL DATA:  Weakness EXAM: PORTABLE CHEST 1 VIEW COMPARISON:  None Available. FINDINGS: Low lung volumes due to in adequate inspiratory force. Fullness of the right suprahilar paramediastinal region may represent an artifact from vascular structures versus a lesion, new to prior. The heart size and mediastinal contours are within normal limits. Both lungs are otherwise no pleural effusion or pneumothorax. Clear. The visualized skeletal structures are unremarkable. IMPRESSION: Fullness of right suprahilar may represent a vascular component versus a true pulmonary/mediastinal lesion. Recommend chest CT  for further assessment. Electronically Signed   By: Megan  Zare M.D.   On: 01/27/2024 19:30    EKG:  Independently reviewed.  Sinus rhythm with LBBB.  Assessment/Plan Principal Problem:   ARF (acute renal failure) (HCC) Active Problems:   Acquired hypothyroidism   Chronic diastolic CHF (congestive heart failure) (HCC)   Cognitive deficit as late effect of traumatic brain injury (HCC)   HTN, goal below 140/90   Morbid obesity with BMI of 40.0-44.9, adult (HCC)   OSA (obstructive sleep apnea)   Anemia   Leucocytosis   Acute encephalopathy   Bleeding    Acute on chronic disease stage III patient has not made any urine.  Cause not clear.  Patient's medication list shows that she is on spironolactone  and also was recently on antibiotics.  Need to check UA when sample available.  Patient is started on fluids.  CT abdomen pelvis does not show any obstruction.  Will continue with hydration follow metabolic panel.  If patient remains oliguric will consult nephrology. Possible sepsis with leukocytosis and recent E. coli bacteremia.  Blood cultures obtained and started on empiric antibiotics.  When available we will try to get UA. Bleeding in the setting of Eliquis INR is 1.9.  Will hold Eliquis.  Recheck INR check DIC panel.  Follow CBC. Acute encephalopathy with history of dementia.  Patient appears lethargic.  Could be from metabolic reasons and also possible sepsis.  CT head and ammonia is pending.  ABG does not show any hypercarbia. History of DVT of the lower extremity per chart on Eliquis.  Holding Eliquis due to bleeding.  Check Dopplers of the lower extremity. Chronic respiratory failure on 3 L oxygen with history of CHF.  VBG does not show any hypercarbia.  CT chest does not show anything acute.  Patient is not wheezing.  Will keep patient on as needed nebulizer closely follow respiratory status. Elevated troponin patient denies any chest pain.  Trend cardiac markers check 2D  echo. Hyperglycemia with no prior mention of diabetes mellitus type 2.  Check hemoglobin A1c I placed patient on sliding scale coverage for now. History of dementia on Aricept  per records. History of depression takes Effexor  and trazodone  which I will hold for now NPO. History of hypertension takes amlodipine  and carvedilol  presently NPO and blood pressure is in the low normal.  Will hold antihypertensives.  Follow blood pressure trends. Anemia with bleeding will follow CBC.  Check anemia panel.  Protonix . History of OSA not sure if patient used to use CPAP.  Will need to get further information.  Presently lethargic. Morbid obesity.   Unable to reach patient's daughter to get further history.  Since patient is having acute on chronic kidney disease with possible sepsis and encephalopathy will need close monitoring further workup and more than 2 midnight stay.  Will consult critical care given the patient's condition of bleeding encephalopathy renal failure.   DVT prophylaxis: Holding off anticoagulation because of bleeding.  Holding of SCDs because of history of DVT. Code Status: Full code as confirmed with patient's staff at rehab.  Unable to reach patient's daughter. Family Communication: Unable to reach patient's daughter with the number provided in the epic. Disposition Plan: Progressive care. Consults called: Will consult critical care. Admission status: Inpatient.

## 2024-01-28 NOTE — Progress Notes (Addendum)
@  2200- Phlebotomy was unable to draw labs from pt due to poor access.  Made MD Opyd aware but wants nephrologist to decide if pt is able to have mid/PICC line. So, send message to on call nephrologist via secure chat as well as iv team if they could assess the pt.  @2300 - While doing a pt care, pt found having small dark rectal bleeding as well as from foley too.Made MD Opyd aware.  Will continue the ongoing plan of care

## 2024-01-28 NOTE — Progress Notes (Signed)
 Received consult to IV team for phlebotomy stick. Secure chat to nurse. IV team does not do IV sticks for the sole purpose of obtaining blood for labs. Sufficient blood return for labs is not guaranteed through an IV catheter.

## 2024-01-28 NOTE — ED Notes (Signed)
 Delphine, MD made aware of nursing concern for lethargy, disorientation, diffuse bleeding/bruising, and urinary status.

## 2024-01-28 NOTE — Plan of Care (Signed)

## 2024-01-28 NOTE — ED Notes (Signed)
 Delphine, MD made aware of bladder scan volume.

## 2024-01-28 NOTE — Progress Notes (Signed)
 BLE venous duplex has been completed.   Results can be found under chart review under CV PROC. 01/28/2024 12:58 PM Cimone Fahey RVT, RDMS

## 2024-01-28 NOTE — ED Notes (Addendum)
 Pt vomited approximately 100 mL of brown emesis

## 2024-01-28 NOTE — Consult Note (Signed)
 [facility] Renal Consultation Note  Requesting MD: TRH Indication for Consultation: oliguric AKI  HPI:  [name] is a 78 y.o. female with PMH of chronic respiratory failure (3L oxygen baseline), diastolic CHF, DVT on eliquis, dementia, hypothyroidism who presented to [facility] 8/14 from SNF with complaint of AMS and bleeding from rectum and foley catheter. Subsequent evaluation is significant for leukocytosis and she is being treated for sepsis of unclear origin, additionally she has oliguria with azotemia well beyond a baseline Cr of 1-1.5.   She was recently admitted to [facility] for E. Coli bacteremia sespsis and COVID infection and subsequently discharged to rehab facility. She was discharged on Levaquin. Additional chronic medicines include baclofen , amantadine , donepezil , Effexor .  Today [name] shares that she is cold but otherwise is lethargic and does not offer history or participate in examination.   Creatinine, Ser  Date/Time Value Ref Range Status  01/28/2024 04:09 AM 3.27 (H) 0.44 - 1.00 mg/dL Final  91/85/7974 94:76 PM 3.09 (H) 0.44 - 1.00 mg/dL Final  91/85/7974 95:68 PM 3.60 (H) 0.44 - 1.00 mg/dL Final  89/85/7975 95:54 AM 1.35 (H) 0.44 - 1.00 mg/dL Final  89/90/7975 93:55 AM 1.04 (H) 0.44 - 1.00 mg/dL Final  89/91/7975 95:99 AM 0.95 0.44 - 1.00 mg/dL Final  89/92/7975 88:50 AM 0.94 0.44 - 1.00 mg/dL Final  90/94/7975 93:42 PM 1.78 (H) 0.44 - 1.00 mg/dL Final  94/79/7975 95:59 AM 1.28 (H) 0.44 - 1.00 mg/dL Final  94/80/7975 95:73 AM 1.11 (H) 0.44 - 1.00 mg/dL Final  94/82/7975 94:79 AM 1.37 (H) 0.44 - 1.00 mg/dL Final  94/83/7975 94:59 AM 1.55 (H) 0.44 - 1.00 mg/dL Final  94/84/7975 95:60 AM 2.25 (H) 0.44 - 1.00 mg/dL Final  94/85/7975 91:42 PM 3.07 (H) 0.44 - 1.00 mg/dL Final  94/85/7975 87:90 PM 3.70 (H) 0.44 - 1.00 mg/dL Final  98/77/7977 94:80 AM 0.99 0.44 - 1.00 mg/dL Final  98/78/7977 94:59 AM 0.92  0.44 - 1.00 mg/dL Final  98/79/7977 94:72 AM 0.71 0.44 - 1.00 mg/dL Final  98/81/7977 92:80 PM 0.61 0.44 - 1.00 mg/dL Final   PMHx:   Past Medical History:  Diagnosis Date   Benign positional vertigo 03/04/2016   Brain injury [facility])    CHF (congestive heart failure) (HCC)    Climacteric    Depression    DJD (degenerative joint disease)    lumbar and cervical   Dyslipidemia    GERD (gastroesophageal reflux disease)    Hypertension    Hypokalemia    Hypothyroidism    IBS (irritable bowel syndrome)    OAB (overactive bladder)    Ovarian cyst 03/25/2015   3cm simple cyst on right ovary   Rhinitis    Sleep apnea    Spasm of thoracolumbar muscle     Past Surgical History:  Procedure Laterality Date   ABDOMINAL HYSTERECTOMY  07/12/2014   ANKLE RECONSTRUCTION Left    BRACHIOPLASTY     BREAST REDUCTION SURGERY Bilateral 2020   CATARACT EXTRACTION     CHOLECYSTECTOMY     COLONOSCOPY WITH PROPOFOL  N/A 12/07/2016   Procedure: COLONOSCOPY WITH PROPOFOL ;  Surgeon: Viktoria Lamar DASEN, MD;  Location: [facility] ENDOSCOPY;  Service: Endoscopy;  Laterality: N/A;   JOINT REPLACEMENT Bilateral 07/2014, 03/2015   nasal reconstrucion     REDUCTION MAMMAPLASTY Bilateral    SHOULDER OPEN ROTATOR CUFF REPAIR     SOFT TISSUE TUMOR RESECTION     excision tumor soft tissue neck, and soft  tissue subcutaneous axilla   TOE SURGERY Right    reconstruction right foot 4th and 5th toe    Family Hx:  Family History  Problem Relation Age of Onset   Lung cancer Mother    Lung cancer Father    Breast cancer Paternal Aunt     Social History:  reports that she has never smoked. She has never used smokeless tobacco. She reports current alcohol  use of about 2.0 standard drinks of alcohol  per week. She reports that she does not use drugs.  Allergies:  Allergies  Allergen Reactions   Codeine Other (See Comments)    Seizure. Tolerates tramadol    Povidone Iodine Shortness Of Breath    Patient had difficulty  breathing during procedure where dura prep was used to clean back.    Hydromorphone Hives, Itching and Other (See Comments)    Severe itching hallucinations   Latex Itching and Other (See Comments)    blisters   Oxycodone-Acetaminophen  Nausea And Vomiting and Itching   Sulfa Antibiotics Itching and Swelling    Swelling eyes, hands, feet. Was at home and didn't go to the hospital   Bupropion Other (See Comments)    Unknown reaction   Gabapentin Other (See Comments)    tremors   Hydrocodone     Tolerates tramadol    Morphine  And Codeine Itching and Rash   Procaine     Medications: Prior to Admission medications   Medication Sig Start Date End Date Taking? Authorizing Provider  acetaminophen  (TYLENOL ) 325 MG tablet Take 650 mg by mouth every 6 (six) hours as needed for mild pain (pain score 1-3).   Yes [provider]  Amantadine  HCl 100 MG tablet Take 100 mg by mouth 2 (two) times daily. 03/21/23  Yes [provider]  amLODipine  (NORVASC ) 5 MG tablet Take 5 mg by mouth daily.   Yes [provider]  Apixaban (ELIQUIS DVT/PE STARTER PACK PO) Take 5 mg by mouth in the morning and at bedtime.   Yes [provider]  ascorbic acid  (VITAMIN C ) 500 MG tablet Take 500 mg by mouth daily.   Yes [provider]  baclofen  (LIORESAL ) 20 MG tablet Take 20 mg by mouth daily as needed for muscle spasms.   Yes [provider]  bumetanide (BUMEX) 1 MG tablet Take 1 mg by mouth 2 (two) times daily.   Yes [provider]  carvedilol  (COREG ) 25 MG tablet Take 50 mg by mouth 2 (two) times daily with a meal.   Yes [provider]  dexamethasone  (DECADRON ) 4 MG tablet Take 6 mg by mouth daily.   Yes [provider]  donepezil  (ARICEPT ) 10 MG tablet Take 10 mg by mouth daily.   Yes [provider]  ferrous sulfate 325 (65 FE) MG tablet Take 325 mg by mouth daily with breakfast.   Yes [provider]  fluconazole  (DIFLUCAN) 100 MG tablet Take 100 mg by mouth daily. For 7 days   Yes [provider]  furosemide  (LASIX ) 20 MG tablet Take 20 mg by mouth every morning.   Yes [provider]  hydrOXYzine (ATARAX) 25 MG tablet Take 25 mg by mouth every 6 (six) hours as needed for itching. 10/18/23  Yes [provider]  ipratropium-albuterol  (DUONEB) 0.5-2.5 (3) MG/3ML SOLN Take 3 mLs by nebulization every 4 (four) hours as needed (for congestion/wheezing).   Yes [provider]  levofloxacin (LEVAQUIN) 750 MG tablet Take 750 mg by mouth daily. For 14 days  Yes [provider]  levothyroxine  (SYNTHROID , LEVOTHROID) 112 MCG tablet Take 112 mcg by mouth daily.   Yes [provider]  losartan  (COZAAR ) 100 MG tablet Take 100 mg by mouth daily.   Yes [provider]  montelukast  (SINGULAIR ) 10 MG tablet Take 10 mg by mouth every evening.   Yes [provider]  nystatin  (MYCOSTATIN ) 100000 UNIT/ML suspension Take 5 mLs by mouth in the morning, at noon, and at bedtime. For 10 days   Yes [provider]  nystatin  (MYCOSTATIN /NYSTOP ) powder Apply topically 2 (two) times daily. Patient taking differently: Apply 1 Application topically 2 (two) times daily as needed. 07/06/20  Yes Von Bellis, MD  omeprazole (PRILOSEC) 20 MG capsule Take 20 mg by mouth daily.   Yes [provider]  saccharomyces boulardii (FLORASTOR) 250 MG capsule Take 250 mg by mouth daily.   Yes [provider]  spironolactone  (ALDACTONE ) 25 MG tablet Take 25 mg by mouth daily. 10/15/22  Yes [provider]  topiramate  (TOPAMAX ) 25 MG tablet Take 25 mg by mouth daily.   Yes [provider]  traZODone  (DESYREL ) 50 MG tablet Take 50 mg by mouth at bedtime.   Yes [provider]  venlafaxine  XR (EFFEXOR -XR) 75 MG 24 hr capsule Take 75 mg by mouth daily with breakfast. 10/17/22  Yes [provider]    I have reviewed the patient's  current medications.  Labs:  Results for orders placed or performed during the hospital encounter of 01/27/24 (from the past 48 hours)  POC occult blood, ED     Status: Abnormal   Collection Time: 01/27/24  3:38 PM  Result Value Ref Range   Fecal Occult Bld POSITIVE (A) NEGATIVE  I-Stat Lactic Acid     Status: None   Collection Time: 01/27/24  4:31 PM  Result Value Ref Range   Lactic Acid, Venous 1.7 0.5 - 1.9 mmol/L  I-stat chem 8, ED (not at [facility], [facility] or [facility])     Status: Abnormal   Collection Time: 01/27/24  4:31 PM  Result Value Ref Range   Sodium 132 (L) 135 - 145 mmol/L   Potassium 4.9 3.5 - 5.1 mmol/L   Chloride 97 (L) 98 - 111 mmol/L   BUN 126 (H) 8 - 23 mg/dL   Creatinine, Ser 6.39 (H) 0.44 - 1.00 mg/dL   Glucose, Bld 767 (H) 70 - 99 mg/dL    Comment: Glucose reference range applies only to samples taken after fasting for at least 8 hours.   Calcium, Ion 1.33 1.15 - 1.40 mmol/L   TCO2 21 (L) 22 - 32 mmol/L   Hemoglobin 10.2 (L) 12.0 - 15.0 g/dL   HCT 69.9 (L) 63.9 - 53.9 %  CBC with Differential     Status: Abnormal   Collection Time: 01/27/24  5:23 PM  Result Value Ref Range   WBC 22.2 (H) 4.0 - 10.5 K/uL   RBC 3.65 (L) 3.87 - 5.11 MIL/uL   Hemoglobin 11.6 (L) 12.0 - 15.0 g/dL   HCT 66.3 (L) 63.9 - 53.9 %   MCV 92.1 80.0 - 100.0 fL   MCH 31.8 26.0 - 34.0 pg   MCHC 34.5 30.0 - 36.0 g/dL   RDW 87.0 88.4 - 84.4 %   Platelets 204 150 - 400 K/uL   nRBC 0.0 0.0 - 0.2 %   Neutrophils Relative % 91 %   Neutro Abs 20.3 (H) 1.7 - 7.7 K/uL   Lymphocytes Relative 3 %  Lymphs Abs 0.6 (L) 0.7 - 4.0 K/uL   Monocytes Relative 4 %   Monocytes Absolute 1.0 0.1 - 1.0 K/uL   Eosinophils Relative 0 %   Eosinophils Absolute 0.0 0.0 - 0.5 K/uL   Basophils Relative 0 %   Basophils Absolute 0.0 0.0 - 0.1 K/uL   Immature Granulocytes 2 %   Abs Immature Granulocytes 0.35 (H) 0.00 - 0.07 K/uL    Comment: Performed at [facility] Lab, [address], [place], [place] [address]   Protime-INR     Status: Abnormal   Collection Time: 01/27/24  5:23 PM  Result Value Ref Range   Prothrombin Time 23.0 (H) 11.4 - 15.2 seconds   INR 1.9 (H) 0.8 - 1.2    Comment: (NOTE) INR goal varies based on device and disease states. Performed at [facility] Lab, [address] [place], [place] [address]   Comprehensive metabolic panel     Status: Abnormal   Collection Time: 01/27/24  5:23 PM  Result Value Ref Range   Sodium 131 (L) 135 - 145 mmol/L   Potassium 4.3 3.5 - 5.1 mmol/L   Chloride 95 (L) 98 - 111 mmol/L   CO2 21 (L) 22 - 32 mmol/L   Glucose, Bld 241 (H) 70 - 99 mg/dL    Comment: Glucose reference range applies only to samples taken after fasting for at least 8 hours.   BUN 127 (H) 8 - 23 mg/dL   Creatinine, Ser 6.90 (H) 0.44 - 1.00 mg/dL   Calcium 89.6 8.9 - 89.6 mg/dL   Total Protein 6.3 (L) 6.5 - 8.1 g/dL   Albumin  3.3 (L) 3.5 - 5.0 g/dL   AST 26 15 - 41 U/L   ALT 17 0 - 44 U/L   Alkaline Phosphatase 43 38 - 126 U/L   Total Bilirubin 0.9 0.0 - 1.2 mg/dL   GFR, Estimated 15 (L) >60 mL/min    Comment: (NOTE) Calculated using the CKD-EPI Creatinine Equation (2021)    Anion gap 15 5 - 15    Comment: Performed at [facility], [address], [place], [place] [address]  Troponin I (High Sensitivity)     Status: Abnormal   Collection Time: 01/27/24  5:23 PM  Result Value Ref Range   Troponin I (High Sensitivity) 84 (H) <18 ng/L    Comment: (NOTE) Elevated high sensitivity troponin I (hsTnI) values and significant  changes across serial measurements may suggest ACS but many other  chronic and acute conditions are known to elevate hsTnI results.  Refer to the Links section for chest pain algorithms and additional  guidance. Performed at [facility], [address] [place], [place] [address]   Brain natriuretic peptide     Status: None   Collection Time: 01/27/24  5:23 PM  Result Value Ref Range   B Natriuretic Peptide 94.4 0.0 - 100.0  pg/mL    Comment: Performed at [facility] Lab, [address], [place], [place] [address]  Type and screen [facility]     Status: None   Collection Time: 01/27/24  5:27 PM  Result Value Ref Range   ABO/RH(D) B POS    Antibody Screen NEG    Sample Expiration      01/30/2024,2359 Performed at [facility], [address] [place], [place] [address]   I-Stat venous blood gas, ED     Status: Abnormal   Collection Time: 01/27/24  5:50 PM  Result Value Ref Range   pH, Ven 7.406 7.25 - 7.43   pCO2, Ven 35.4 (L) 44 - 60 mmHg   pO2, Ven 37 32 - 45 mmHg   Bicarbonate 22.2 20.0 - 28.0 mmol/L   TCO2 23 22 - 32 mmol/L   O2 Saturation 72 %   Acid-base deficit 2.0 0.0 - 2.0 mmol/L   Sodium 132 (L) 135 - 145 mmol/L   Potassium 4.1 3.5 - 5.1 mmol/L   Calcium, Ion 1.31 1.15 - 1.40 mmol/L   HCT 34.0 (L) 36.0 - 46.0 %   Hemoglobin 11.6 (L) 12.0 - 15.0 g/dL   Sample type VENOUS    Comment NOTIFIED PHYSICIAN   I-Stat Lactic Acid     Status: None   Collection Time: 01/27/24  6:45 PM  Result Value Ref Range   Lactic Acid, Venous 1.3 0.5 - 1.9 mmol/L  Troponin I (High Sensitivity)     Status: Abnormal   Collection Time: 01/27/24  7:55 PM  Result Value Ref Range   Troponin I (High Sensitivity) 93 (H) <18 ng/L    Comment: (NOTE) Elevated high sensitivity troponin I (hsTnI) values and significant  changes across serial measurements may suggest ACS but many other  chronic and acute conditions are known to elevate hsTnI results.  Refer to the Links section for chest pain algorithms and additional  guidance. Performed at [facility], [address], [place], [place] [address]   Culture, blood (Routine X 2) w Reflex to ID Panel     Status: None (Preliminary result)   Collection Time: 01/27/24 11:30 PM   Specimen: BLOOD  Result Value Ref Range   Specimen Description BLOOD SITE NOT SPECIFIED    Special Requests      BOTTLES DRAWN AEROBIC AND ANAEROBIC Blood  Culture results may not be optimal due to an inadequate volume of blood received in culture bottles   Culture      NO GROWTH < 12 HOURS Performed at [facility], [address], [place], [place] [address]    Report Status PENDING   Culture, blood (Routine X 2) w Reflex to ID Panel     Status: None (Preliminary result)   Collection Time: 01/27/24 11:35 PM   Specimen: BLOOD  Result Value Ref Range   Specimen Description BLOOD SITE NOT SPECIFIED    Special Requests      BOTTLES DRAWN AEROBIC AND ANAEROBIC Blood Culture results may not be optimal due to an inadequate volume of blood received in culture bottles   Culture      NO GROWTH < 12 HOURS Performed at [facility] Lab, [address] [place], [place] [address]    Report Status PENDING   Ammonia     Status: None   Collection Time: 01/27/24 11:43 PM  Result Value Ref Range   Ammonia 30 9 - 35 umol/L    Comment: HEMOLYSIS AT THIS LEVEL MAY AFFECT RESULT Performed at [facility], [address] [place], [place] [address]   TSH     Status: None   Collection Time: 01/27/24 11:43 PM  Result Value Ref Range   TSH 0.782 0.350 - 4.500 uIU/mL    Comment: Performed by a 3rd Generation assay with a functional sensitivity of <=0.01 uIU/mL. Performed at [facility], [address] [place], [place] [address]   Vitamin B12     Status: None   Collection Time: 01/27/24 11:43 PM  Result Value Ref  Range   Vitamin B-12 497 180 - 914 pg/mL    Comment: HEMOLYSIS AT THIS LEVEL MAY AFFECT RESULT (NOTE) This assay is not validated for testing neonatal or myeloproliferative syndrome specimens for Vitamin B12 levels. Performed at [facility] Lab, [address], [place], [place] [address]   Folate     Status: None   Collection Time: 01/27/24 11:43 PM  Result Value Ref Range   Folate 13.5 >5.9 ng/mL    Comment: Performed at [facility] Lab, [address], [place], [place] [address]  Iron and TIBC     Status: None    Collection Time: 01/27/24 11:43 PM  Result Value Ref Range   Iron 98 28 - 170 ug/dL   TIBC 664 749 - 549 ug/dL   Saturation Ratios 29 10.4 - 31.8 %   UIBC 237 ug/dL    Comment: Performed at [facility] Lab, [address], [place], [place] [address]  Ferritin     Status: Abnormal   Collection Time: 01/27/24 11:43 PM  Result Value Ref Range   Ferritin 408 (H) 11 - 307 ng/mL    Comment: Performed at [facility] Lab, [address] [place], [place] [address]  Blood gas, venous     Status: Abnormal   Collection Time: 01/28/24 12:10 AM  Result Value Ref Range   pH, Ven 7.39 7.25 - 7.43   pCO2, Ven 35 (L) 44 - 60 mmHg   pO2, Ven 61 (H) 32 - 45 mmHg   Bicarbonate 21.2 20.0 - 28.0 mmol/L   Acid-base deficit 3.1 (H) 0.0 - 2.0 mmol/L   O2 Saturation 92.8 %   Patient temperature 37.1     Comment: Performed at [facility] Lab, [address], [place], [place] [address]  CBG monitoring, ED     Status: Abnormal   Collection Time: 01/28/24  3:49 AM  Result Value Ref Range   Glucose-Capillary 229 (H) 70 - 99 mg/dL    Comment: Glucose reference range applies only to samples taken after fasting for at least 8 hours.  Basic metabolic panel     Status: Abnormal   Collection Time: 01/28/24  4:09 AM  Result Value Ref Range   Sodium 134 (L) 135 - 145 mmol/L   Potassium 4.0 3.5 - 5.1 mmol/L   Chloride 99 98 - 111 mmol/L   CO2 19 (L) 22 - 32 mmol/L   Glucose, Bld 240 (H) 70 - 99 mg/dL    Comment: Glucose reference range applies only to samples taken after fasting for at least 8 hours.   BUN 129 (H) 8 - 23 mg/dL   Creatinine, Ser 6.72 (H) 0.44 - 1.00 mg/dL   Calcium 9.8 8.9 - 89.6 mg/dL   GFR, Estimated 14 (L) >60 mL/min    Comment: (NOTE) Calculated using the CKD-EPI Creatinine Equation (2021)    Anion gap 16 (H) 5 - 15    Comment: Performed at [facility] Lab, [address] [place], [place] [address]  Hepatic function panel     Status: Abnormal   Collection Time: 01/28/24   4:09 AM  Result Value Ref Range   Total Protein 5.8 (L) 6.5 - 8.1 g/dL   Albumin  3.0 (L) 3.5 - 5.0 g/dL   AST 21 15 - 41 U/L   ALT 15 0 - 44 U/L   Alkaline Phosphatase 42 38 - 126 U/L   Total Bilirubin 1.1 0.0 - 1.2 mg/dL   Bilirubin, Direct  0.2 0.0 - 0.2 mg/dL   Indirect Bilirubin 0.9 0.3 - 0.9 mg/dL    Comment: Performed at [facility] Lab, [address] [place], [place] [address]  Magnesium      Status: None   Collection Time: 01/28/24  4:09 AM  Result Value Ref Range   Magnesium  2.1 1.7 - 2.4 mg/dL    Comment: Performed at [facility] Lab, [address], [place], [place] [address]  Phosphorus     Status: Abnormal   Collection Time: 01/28/24  4:09 AM  Result Value Ref Range   Phosphorus 5.2 (H) 2.5 - 4.6 mg/dL    Comment: Performed at [facility] Lab, [address], [place], [place] [address]  CBC with Differential/Platelet     Status: Abnormal   Collection Time: 01/28/24  4:09 AM  Result Value Ref Range   WBC 21.7 (H) 4.0 - 10.5 K/uL   RBC 3.35 (L) 3.87 - 5.11 MIL/uL   Hemoglobin 10.6 (L) 12.0 - 15.0 g/dL   HCT 67.8 (L) 63.9 - 53.9 %   MCV 95.8 80.0 - 100.0 fL   MCH 31.6 26.0 - 34.0 pg   MCHC 33.0 30.0 - 36.0 g/dL   RDW 86.7 88.4 - 84.4 %   Platelets 194 150 - 400 K/uL   nRBC 0.0 0.0 - 0.2 %   Neutrophils Relative % 88 %   Neutro Abs 19.2 (H) 1.7 - 7.7 K/uL   Lymphocytes Relative 4 %   Lymphs Abs 0.8 0.7 - 4.0 K/uL   Monocytes Relative 6 %   Monocytes Absolute 1.3 (H) 0.1 - 1.0 K/uL   Eosinophils Relative 0 %   Eosinophils Absolute 0.0 0.0 - 0.5 K/uL   Basophils Relative 0 %   Basophils Absolute 0.0 0.0 - 0.1 K/uL   Immature Granulocytes 2 %   Abs Immature Granulocytes 0.39 (H) 0.00 - 0.07 K/uL    Comment: Performed at [facility] Lab, [address], [place], [place] [address]  Hemoglobin A1c     Status: Abnormal   Collection Time: 01/28/24  4:09 AM  Result Value Ref Range   Hgb A1c MFr Bld 6.4 (H) 4.8 - 5.6 %    Comment: (NOTE) Diagnosis of  Diabetes The following HbA1c ranges recommended by the American Diabetes Association (ADA) may be used as an aid in the diagnosis of diabetes mellitus.  Hemoglobin             Suggested A1C NGSP%              Diagnosis  <5.7                   Non Diabetic  5.7-6.4                Pre-Diabetic  >6.4                   Diabetic  <7.0                   Glycemic control for                       adults with diabetes.     Mean Plasma Glucose 136.98 mg/dL    Comment: Performed at [facility] Lab, [address] [place], [place] [address]  CK     Status: None   Collection Time: 01/28/24  4:09 AM  Result Value Ref Range   Total CK  98 38 - 234 U/L    Comment: Performed at [facility], [address], [place], [place] [address]  Troponin I (High Sensitivity)     Status: Abnormal   Collection Time: 01/28/24  4:09 AM  Result Value Ref Range   Troponin I (High Sensitivity) 88 (H) <18 ng/L    Comment: (NOTE) Elevated high sensitivity troponin I (hsTnI) values and significant  changes across serial measurements may suggest ACS but many other  chronic and acute conditions are known to elevate hsTnI results.  Refer to the Links section for chest pain algorithms and additional  guidance. Performed at [facility] Lab, [address] [place], [place] [address]   Reticulocytes     Status: Abnormal   Collection Time: 01/28/24  4:10 AM  Result Value Ref Range   Retic Ct Pct 1.3 0.4 - 3.1 %   RBC. 3.32 (L) 3.87 - 5.11 MIL/uL   Retic Count, Absolute 42.5 19.0 - 186.0 K/uL   Immature Retic Fract 4.9 2.3 - 15.9 %    Comment: Performed at [facility] Lab, [address], [place], [place] [address]  DIC Panel ONCE - URGENT     Status: Abnormal   Collection Time: 01/28/24  4:10 AM  Result Value Ref Range   Prothrombin Time 22.3 (H) 11.4 - 15.2 seconds   INR 1.9 (H) 0.8 - 1.2    Comment: (NOTE) INR goal varies based on device and disease states.    aPTT 25 24 - 36 seconds    Fibrinogen 341 210 - 475 mg/dL    Comment: (NOTE) Fibrinogen results may be underestimated in patients receiving thrombolytic therapy.    D-Dimer, Quant 0.50 0.00 - 0.50 ug/mL-FEU    Comment: (NOTE) At the manufacturer cut-off value of 0.5 g/mL FEU, this assay has a negative predictive value of 95-100%.This assay is intended for use in conjunction with a clinical pretest probability (PTP) assessment model to exclude pulmonary embolism (PE) and deep venous thrombosis (DVT) in outpatients suspected of PE or DVT. Results should be correlated with clinical presentation.    Platelets 188 150 - 400 K/uL   Smear Review NO SCHISTOCYTES SEEN     Comment: Performed at [facility] Lab, [address] [place], [place] [address]  Respiratory (~20 pathogens) panel by PCR     Status: None   Collection Time: 01/28/24  5:59 AM   Specimen: Nasopharyngeal Swab; Respiratory  Result Value Ref Range   Adenovirus NOT DETECTED NOT DETECTED   Coronavirus 229E NOT DETECTED NOT DETECTED    Comment: (NOTE) The Coronavirus on the Respiratory Panel, DOES NOT test for the novel  Coronavirus (2019 nCoV)    Coronavirus HKU1 NOT DETECTED NOT DETECTED   Coronavirus NL63 NOT DETECTED NOT DETECTED   Coronavirus OC43 NOT DETECTED NOT DETECTED   Metapneumovirus NOT DETECTED NOT DETECTED   Rhinovirus / Enterovirus NOT DETECTED NOT DETECTED   Influenza A NOT DETECTED NOT DETECTED   Influenza B NOT DETECTED NOT DETECTED   Parainfluenza Virus 1 NOT DETECTED NOT DETECTED   Parainfluenza Virus 2 NOT DETECTED NOT DETECTED   Parainfluenza Virus 3 NOT DETECTED NOT DETECTED   Parainfluenza Virus 4 NOT DETECTED NOT DETECTED   Respiratory Syncytial Virus NOT DETECTED NOT DETECTED   Bordetella pertussis NOT DETECTED NOT DETECTED   Bordetella Parapertussis NOT DETECTED NOT DETECTED   Chlamydophila pneumoniae NOT DETECTED NOT DETECTED   Mycoplasma pneumoniae NOT DETECTED NOT DETECTED    Comment: Performed  at [facility] Lab, [address], [place], [place] [address]  CBG monitoring, ED     Status: Abnormal   Collection Time: 01/28/24  8:07 AM  Result Value Ref Range   Glucose-Capillary 263 (H) 70 - 99 mg/dL    Comment: Glucose reference range applies only to samples taken after fasting for at least 8 hours.  CBG monitoring, ED     Status: Abnormal   Collection Time: 01/28/24 12:20 PM  Result Value Ref Range   Glucose-Capillary 221 (H) 70 - 99 mg/dL    Comment: Glucose reference range applies only to samples taken after fasting for at least 8 hours.    ROS:  Pertinent items are noted in HPI.  Physical Exam: Vitals:   01/28/24 1130 01/28/24 1331  BP: (!) 122/59   Pulse: 64   Resp: 10   Temp:  (!) 97.2 F (36.2 C)  SpO2: 100%      General: Somnolent ill-appearing woman in NAD, obese HEENT: No JVD (very difficult to assess), dry membranes (but mouth breathing), some dried blood at nares Heart: RRR no MRG, heart sounds are distant Lungs: distant lung sounds, no apparent rhonchi, rales Abdomen: Soft and nontender with normal bowel sounds Extremities: Appropriately warm with exception of L hand which is cool, pulses intact, multiple bruises, hematomas on extremities most profound on dorsum of hands near likely prior IV access sites. 2+ edema on Les with skin wrinkling. Neuro: Lethargic but Aox3 for 10-20 seconds when awakened  Assessment/Plan:  AKI (baseline Cr 1-1.5) with oliguria Azotemia, ?Uremia ?Sepsis unclear source, recent E coli bactermia AGMA Bleeding from multiple sites Altered mental status History of CHF and Chronic Respiratory Failure (3L [place])  Patient with acute renal failure, oliguria, encephalopathy, bleeding from mulitple sites including rectal, urinary catheter, nares, prior IV sites (on Eliquis for prior DVT). Fortunately bleeding has stopped, Hg stable, and with catheter replacement there is of yellow urine collected. I am able to wake her up and she is AO3, but  confused, cannot provide history, and falls asleep very quickly.  Workup to this point: not rhabdo, no evidence of hypoperfusion since arrival to ED, DIC panel and CBC not suggestive of DIC, HUS, thrombocytopenia. Volume exam difficult, has LE edema but also skin wrinkling, membranes dry, so could be chronically overloaded but acutely dry. Note BNP 94, and although obesity may falsely lower it, chest imaging is clear, pending ECHO may clarify things. She has leukocytosis, AMS, urinary catheter and hospitalization history so agree with broad abx.  Picture unclear at this time, not sure if her AKI is pre-renal or intrinsic, but elevated BUN:Cr ratio would be consistent with dehydration and/or GI bleed. Urine studies are pending and given her catheter history and AMS this could be a UTI. Additionally, polypharmacy with renal failure likely contributing to encephalopathy - examples baclofen , levaquin, effexor , donepezil .  No stones or hydronephrosis on RUS. Her BUN is noteworthy, so will monitor mental status and bleeds should she require dialysis (but not planned at this time).  - Await UA, urine lytes, Ucx - Agree with broad Abx - Continue IV fluid resuscitation - Would consider ddavp if bleeding resumes re uremic platelet dysfunction - Supportive care as underway - Renal will continue to follow  Lonni Africa, DO IM resident PGY-2 01/28/2024, 1:33 PM

## 2024-01-28 NOTE — Progress Notes (Signed)
 PROGRESS NOTE    [name] [name]  FMW:[id number] DOB: 10/11/1945 DOA: 01/27/2024 PCP: Lenon Layman ORN, MD  Outpatient Specialists:     Brief Narrative:  Patient is a 78 year old female, morbidly obese, with past medical history significant for CHF, dyslipidemia, brain injury, hypertension, dementia, hypothyroidism and OSA.  Patient was admitted with possible sepsis, acute on chronic kidney disease stage III, bleeding (patient is on Eliquis), encephalopathy and chronic respiratory failure.  Patient is on IV cefepime  and Zyvox .  CT head has not shown any acute findings.  CT chest result is noted.  Recent inpatient care at the hospital in [place], [place] , for E. coli bacteremia/sepsis, in the setting of COVID infection.  Patient was discharged on January 17, 2024 on Levaquin.  Renal ultrasound was negative for hydronephrosis or renal stone.  Renal ultrasound was suggestive of progressive chronic medical renal disease.  BMP revealed sodium of 134, potassium of 4, CO2 of 19, BUN of 129 with creatinine of 3.27, phosphorus of 5.2.  Doppler vascular ultrasound of left lower extremity was negative for DVT.  01/28/2024: Patient remains encephalopathic.  No history from patient.  Patient has bruises/hematoma involving the extremities, chest and the back.  UA revealed large leukocyte esterase, rare bacteria, protein of 30 and small hemoglobin.   Assessment & Plan:   Principal Problem:   ARF (acute renal failure) (HCC) Active Problems:   Acquired hypothyroidism   Chronic diastolic CHF (congestive heart failure) (HCC)   Cognitive deficit as late effect of traumatic brain injury (HCC)   HTN, goal below 140/90   Morbid obesity with BMI of 40.0-44.9, adult (HCC)   OSA (obstructive sleep apnea)   Anemia   Leucocytosis   Acute encephalopathy   Bleeding   History of DVT (deep vein thrombosis)   Altered mental status   Acute on chronic disease stage IIIa/b: Patient has not made any  urine.  Cause not clear.  Patient's medication list shows that she is on spironolactone  and also was recently on antibiotics.  Need to check UA when sample available.  Patient is started on fluids.  CT abdomen pelvis does not show any obstruction.  Will continue with hydration follow metabolic panel.  If patient remains oliguric will consult nephrology. 01/28/2024: Nephrology team consulted.  Input is highly appreciated.  Renal ultrasound reveals medical renal disease.  See above documentation.  Repeat renal panel.  Patient is now making urine.  Check ANA, C3, C4 and urine sodium.  Renal prognosis is guarded. -AKI is likely multifactorial.  Possible sepsis with leukocytosis and recent E. coli bacteremia:. Blood cultures obtained and started on empiric antibiotics.   Follow culture result. Send urine sample for UA and culture. Continue antibiotics. 01/28/2024: Blood cultures pending.  UA revealed leukocytes.  Proceed with urine culture.  Continue antibiotics.  Bleeding in the setting of Eliquis: INR is 1.9.  Will hold Eliquis.  Recheck INR check DIC panel.  Follow CBC.  Acute encephalopathy with history of dementia: Patient remains lethargic.  No minimal interaction.  CT head is nonrevealing.   -Patient's husband has dementia, and is 77 years old.  Husband is unable to make decisions for patient's. - Patient has no children.  No living siblings.  Patient's daughter-in-law, Uilani [name], has kindly agreed to provide patient's living will. - Guarded prognosis. - Low threshold to consult the palliative care team.  History of DVT of the lower extremity per chart on Eliquis: - Eliquis is currently on hold. - Doppler ultrasound of lower extremities is  negative for DVT.   Chronic respiratory failure on 3 L oxygen with history of CHF.  VBG does not show any hypercarbia.  CT chest does not show anything acute.  Patient is not wheezing.  Will keep patient on as needed nebulizer closely follow  respiratory status.  Hyperglycemia: - With no prior mention of diabetes mellitus type 2.  Check hemoglobin A1c I placed patient on sliding scale coverage for now. - Follow A1c. - Monitor CBC.  History of dementia: -On Aricept  per records.  History of depression: - Patient was on Effexor  and trazodone   - Continue to hold mind altering medications.  History of hypertension: - Patient was amlodipine  and carvedilol , but currently on hold - Continue to optimize.   - Blood pressure was low normal, with documented systolic blood pressure of 84 mmHg at some point  Anemia with bleeding will follow CBC.  Check anemia panel.  Protonix .  History of OSA:   Morbid obesity.     DVT prophylaxis: Eliquis is on hold.  Patient has elevated INR (1.9).  Bruising/hematoma noted. Code Status: Full code. Family Communication:  Disposition Plan: Inpatient.   Consultants:  Critical care team. Nephrology  Procedures:  None  Antimicrobials:  IV cefepime . IV Zyvox    Subjective: No history from patient  Objective: Vitals:   01/28/24 0803 01/28/24 0845 01/28/24 0930 01/28/24 0934  BP:  116/70 (!) 135/57   Pulse:  67 72   Resp:  12 18   Temp: 97.6 F (36.4 C)   (!) 96.6 F (35.9 C)  TempSrc: Axillary   Axillary  SpO2:  100% 100%   Weight:      Height:        Intake/Output Summary (Last 24 hours) at 01/28/2024 1226 Last data filed at 01/28/2024 0543 Gross per 24 hour  Intake 1368.17 ml  Output --  Net 1368.17 ml   Filed Weights   01/27/24 1719  Weight: 131.5 kg    Examination:  General exam: Lethargic.  Looks dry.  Bruises in arms and trunk.   Respiratory system: Clear to auscultation. . Cardiovascular system: S1 & S2 Gastrointestinal system: Abdomen is obese and nontender Central nervous system: Patient is lethargic Extremities: Bruises in the upper extremities  Data Reviewed: I have personally reviewed following labs and imaging studies  CBC: Recent Labs  Lab  01/27/24 1631 01/27/24 1723 01/27/24 1750 01/28/24 0409 01/28/24 0410  WBC  --  22.2*  --  21.7*  --   NEUTROABS  --  20.3*  --  19.2*  --   HGB 10.2* 11.6* 11.6* 10.6*  --   HCT 30.0* 33.6* 34.0* 32.1*  --   MCV  --  92.1  --  95.8  --   PLT  --  204  --  194 188   Basic Metabolic Panel: Recent Labs  Lab 01/27/24 1631 01/27/24 1723 01/27/24 1750 01/28/24 0409  NA 132* 131* 132* 134*  K 4.9 4.3 4.1 4.0  CL 97* 95*  --  99  CO2  --  21*  --  19*  GLUCOSE 232* 241*  --  240*  BUN 126* 127*  --  129*  CREATININE 3.60* 3.09*  --  3.27*  CALCIUM  --  10.3  --  9.8  MG  --   --   --  2.1  PHOS  --   --   --  5.2*   GFR: Estimated Creatinine Clearance: 19.7 mL/min (A) (by C-G formula based on SCr of  3.27 mg/dL (H)). Liver Function Tests: Recent Labs  Lab 01/27/24 1723 01/28/24 0409  AST 26 21  ALT 17 15  ALKPHOS 43 42  BILITOT 0.9 1.1  PROT 6.3* 5.8*  ALBUMIN  3.3* 3.0*   No results for input(s): LIPASE, AMYLASE in the last 168 hours. Recent Labs  Lab 01/27/24 2343  AMMONIA 30   Coagulation Profile: Recent Labs  Lab 01/27/24 1723 01/28/24 0410  INR 1.9* 1.9*   Cardiac Enzymes: Recent Labs  Lab 01/28/24 0409  CKTOTAL 98   BNP (last 3 results) No results for input(s): PROBNP in the last 8760 hours. HbA1C: Recent Labs    01/28/24 0409  HGBA1C 6.4*   CBG: Recent Labs  Lab 01/28/24 0349 01/28/24 0807 01/28/24 1220  GLUCAP 229* 263* 221*   Lipid Profile: No results for input(s): CHOL, HDL, LDLCALC, TRIG, CHOLHDL, LDLDIRECT in the last 72 hours. Thyroid Function Tests: Recent Labs    01/27/24 2343  TSH 0.782   Anemia Panel: Recent Labs    01/27/24 2343 01/28/24 0410  VITAMINB12 497  --   FOLATE 13.5  --   FERRITIN 408*  --   TIBC 335  --   IRON 98  --   RETICCTPCT  --  1.3   Urine analysis:    Component Value Date/Time   COLORURINE YELLOW (A) 03/22/2023 1454   APPEARANCEUR HAZY (A) 03/22/2023 1454   LABSPEC  1.013 03/22/2023 1454   PHURINE 7.0 03/22/2023 1454   GLUCOSEU NEGATIVE 03/22/2023 1454   HGBUR NEGATIVE 03/22/2023 1454   BILIRUBINUR NEGATIVE 03/22/2023 1454   KETONESUR NEGATIVE 03/22/2023 1454   PROTEINUR NEGATIVE 03/22/2023 1454   NITRITE POSITIVE (A) 03/22/2023 1454   LEUKOCYTESUR MODERATE (A) 03/22/2023 1454   Sepsis Labs: @LABRCNTIP (procalcitonin:4,lacticidven:4)  ) Recent Results (from the past 240 hours)  Culture, blood (Routine X 2) w Reflex to ID Panel     Status: None (Preliminary result)   Collection Time: 01/27/24 11:30 PM   Specimen: BLOOD  Result Value Ref Range Status   Specimen Description BLOOD SITE NOT SPECIFIED  Final   Special Requests   Final    BOTTLES DRAWN AEROBIC AND ANAEROBIC Blood Culture results may not be optimal due to an inadequate volume of blood received in culture bottles   Culture   Final    NO GROWTH < 12 HOURS Performed at [facility], [address] [place], [place] [address]    Report Status PENDING  Incomplete  Culture, blood (Routine X 2) w Reflex to ID Panel     Status: None (Preliminary result)   Collection Time: 01/27/24 11:35 PM   Specimen: BLOOD  Result Value Ref Range Status   Specimen Description BLOOD SITE NOT SPECIFIED  Final   Special Requests   Final    BOTTLES DRAWN AEROBIC AND ANAEROBIC Blood Culture results may not be optimal due to an inadequate volume of blood received in culture bottles   Culture   Final    NO GROWTH < 12 HOURS Performed at [facility], [address], [place], [place] [address]    Report Status PENDING  Incomplete  Respiratory (~20 pathogens) panel by PCR     Status: None   Collection Time: 01/28/24  5:59 AM   Specimen: Nasopharyngeal Swab; Respiratory  Result Value Ref Range Status   Adenovirus NOT DETECTED NOT DETECTED Final   Coronavirus 229E NOT DETECTED NOT DETECTED Final    Comment: (NOTE) The Coronavirus on the Respiratory Panel, DOES  NOT test for the novel  Coronavirus  (2019 nCoV)    Coronavirus HKU1 NOT DETECTED NOT DETECTED Final   Coronavirus NL63 NOT DETECTED NOT DETECTED Final   Coronavirus OC43 NOT DETECTED NOT DETECTED Final   Metapneumovirus NOT DETECTED NOT DETECTED Final   Rhinovirus / Enterovirus NOT DETECTED NOT DETECTED Final   Influenza A NOT DETECTED NOT DETECTED Final   Influenza B NOT DETECTED NOT DETECTED Final   Parainfluenza Virus 1 NOT DETECTED NOT DETECTED Final   Parainfluenza Virus 2 NOT DETECTED NOT DETECTED Final   Parainfluenza Virus 3 NOT DETECTED NOT DETECTED Final   Parainfluenza Virus 4 NOT DETECTED NOT DETECTED Final   Respiratory Syncytial Virus NOT DETECTED NOT DETECTED Final   Bordetella pertussis NOT DETECTED NOT DETECTED Final   Bordetella Parapertussis NOT DETECTED NOT DETECTED Final   Chlamydophila pneumoniae NOT DETECTED NOT DETECTED Final   Mycoplasma pneumoniae NOT DETECTED NOT DETECTED Final    Comment: Performed at [facility], [address] [place], [place] [address]         Radiology Studies: US  RENAL Result Date: 01/28/2024 EXAM: US  Retroperitoneum Complete, Renal. CLINICAL HISTORY: 409830 AKI (acute kidney injury) (HCC) 409830. AKI (acute kidney injury) (HCC). TECHNIQUE: Real-time ultrasound of the retroperitoneum (complete) with image documentation. COMPARISON: 10/28/2022 FINDINGS: RIGHT KIDNEY: The right kidney measures 10.7 x 4.9 x 4.8 cm. The volume is 132 cc. On the previous exam, the right kidney had a volume of 178.2 cc. No hydronephrosis, renal stone or mass visualized. LEFT KIDNEY: The left kidney measures 7.4 x 4.8 x 3.6 cm. The volume is equal to 67.2 cc. On the previous exam, the left kidney had a volume of 96.5 cc. There is mild increased parenchymal echogenicity within the left kidney. No hydronephrosis, renal stone or mass visualized. BLADDER: Unremarkable as visualized. IMPRESSION: 1. No hydronephrosis or renal stone. 2. Decreased volume of both kidneys compared to the prior study,  with mild increased parenchymal echogenicity in the left kidney. 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 Electronically signed by: Waddell Calk MD 01/28/2024 07:36 AM EDT RP Workstation: [id number]   CT HEAD WO CONTRAST ( ) Result Date: 01/28/2024 CLINICAL DATA:  78 year old female with altered mental status. On Eliquis. Evidence of GI or GU bleeding. EXAM: CT HEAD WITHOUT CONTRAST TECHNIQUE: Contiguous axial images were obtained from the base of the skull through the vertex without intravenous contrast. RADIATION DOSE REDUCTION: This exam was performed according to the departmental dose-optimization program which includes automated exposure control, adjustment of the mA and/or kV according to patient size and/or use of iterative reconstruction technique. COMPARISON:  Head CT 10/27/2022. FINDINGS: Brain: Stable cerebral volume. No midline shift, ventriculomegaly, mass effect, evidence of mass lesion, intracranial hemorrhage or evidence of cortically based acute infarction. Chronic periventricular white matter hypodensity, moderate for age is stable from last year along with gray-white differentiation. Vascular: No suspicious intracranial vascular hyperdensity. Calcified atherosclerosis at the skull base. Skull: Stable and intact. Sinuses/Orbits: New right middle ear and mastoid opacification from last year. Other visualized paranasal sinuses and mastoids are stable and well aerated. Other: Stable orbit and scalp soft tissues. IMPRESSION: 1. New right middle ear and mastoid opacification from last year. Consider otitis media with mastoid effusion. 2. No acute intracranial abnormality. Stable non contrast CT appearance of chronic white matter disease. Electronically Signed   By: VEAR Hurst M.D.   On: 01/28/2024 04:06   CT CHEST ABDOMEN PELVIS WO CONTRAST Result Date: 01/28/2024 CLINICAL DATA:  Chest  and abdominal pain EXAM: CT CHEST, ABDOMEN AND PELVIS WITHOUT CONTRAST TECHNIQUE:  Multidetector CT imaging of the chest, abdomen and pelvis was performed following the standard protocol without IV contrast. RADIATION DOSE REDUCTION: This exam was performed according to the departmental dose-optimization program which includes automated exposure control, adjustment of the mA and/or kV according to patient size and/or use of iterative reconstruction technique. COMPARISON:  Chest x-ray from the previous day. FINDINGS: CT CHEST FINDINGS Cardiovascular: Somewhat limited due to lack of IV contrast. Atherosclerotic calcification of the aorta is noted without aneurysmal dilatation. No cardiac enlargement is seen. No abnormality in the right perihilar region is noted to correspond with that seen on prior chest x-ray. This was felt to have been related to patient rotation to the right. Mediastinum/Nodes: Thoracic inlet is within normal limits. No hilar or mediastinal adenopathy is noted. The esophagus as visualized is within normal limits. Lungs/Pleura: Lungs are well aerated bilaterally. No focal infiltrate or effusion is seen. No parenchymal nodules are noted. Musculoskeletal: Degenerative changes of the thoracic spine are seen. No acute rib abnormality is noted. CT ABDOMEN PELVIS FINDINGS Hepatobiliary: No focal liver abnormality is seen. Status post cholecystectomy. No biliary dilatation. Pancreas: Unremarkable. No pancreatic ductal dilatation or surrounding inflammatory changes. Spleen: Normal in size without focal abnormality. Adrenals/Urinary Tract: Adrenal glands are within normal limits. Kidneys are well visualized bilaterally. No renal calculi or obstructive changes are seen. Renal arterial calcifications are noted. The bladder is decompressed. Stomach/Bowel: Fecal material is noted scattered throughout the colon. Minimal diverticular changes seen. No diverticulitis is noted. The appendix is within normal limits. Small bowel and stomach are unremarkable. Vascular/Lymphatic: Aortic  atherosclerosis. No enlarged abdominal or pelvic lymph nodes. Reproductive: Status post hysterectomy. Simple appearing right adnexal cyst measuring 5.9 cm. Other: No abdominal wall hernia or abnormality. No abdominopelvic ascites. Musculoskeletal: Degenerative change of the lumbar spine is noted. No acute bony abnormality is seen. IMPRESSION: CT of the chest: No acute abnormality to correspond with that seen on recent chest x-ray. CT of the abdomen and pelvis: Diverticulosis without diverticulitis. Right adnexal simple-appearing cyst measuring 5.9 cm. Recommend follow-up pelvic ultrasound in 6-12 months. Reference: JACR 2020 Feb;17(2):248-254 Electronically Signed   By: Oneil Devonshire M.D.   On: 01/28/2024 01:05   DG Chest Port 1 View Result Date: 01/27/2024 CLINICAL DATA:  Weakness EXAM: PORTABLE CHEST 1 VIEW COMPARISON:  None Available. FINDINGS: Low lung volumes due to in adequate inspiratory force. Fullness of the right suprahilar paramediastinal region may represent an artifact from vascular structures versus a lesion, new to prior. The heart size and mediastinal contours are within normal limits. Both lungs are otherwise no pleural effusion or pneumothorax. Clear. The visualized skeletal structures are unremarkable. IMPRESSION: Fullness of right suprahilar may represent a vascular component versus a true pulmonary/mediastinal lesion. Recommend chest CT for further assessment. Electronically Signed   By: Megan  Zare M.D.   On: 01/27/2024 19:30        Scheduled Meds:  Chlorhexidine  Gluconate Cloth  6 each Topical Daily   insulin  aspart  0-6 Units Subcutaneous Q4H   pantoprazole  (PROTONIX ) IV  40 mg Intravenous Q12H   thiamine  (VITAMIN B1) injection  100 mg Intravenous Daily   Continuous Infusions:  ceFEPime  (MAXIPIME ) IV Stopped (01/28/24 0415)   lactated ringers  125 mL/hr at 01/28/24 0420   linezolid  (ZYVOX ) IV Stopped (01/28/24 0543)     LOS: 0 days    Time spent: 55  minutes.    Leatrice Chapel, MD  [facility]  Pager #: [phone] 7PM-7AM contact night coverage as above

## 2024-01-28 NOTE — ED Notes (Signed)
Pt taken to vascular lab at this time.

## 2024-01-29 ENCOUNTER — Inpatient Hospital Stay (HOSPITAL_COMMUNITY)

## 2024-01-29 DIAGNOSIS — N1832 Chronic kidney disease, stage 3b: Secondary | ICD-10-CM | POA: Diagnosis not present

## 2024-01-29 DIAGNOSIS — I5032 Chronic diastolic (congestive) heart failure: Secondary | ICD-10-CM

## 2024-01-29 DIAGNOSIS — E039 Hypothyroidism, unspecified: Secondary | ICD-10-CM

## 2024-01-29 DIAGNOSIS — N19 Unspecified kidney failure: Secondary | ICD-10-CM

## 2024-01-29 DIAGNOSIS — J9611 Chronic respiratory failure with hypoxia: Secondary | ICD-10-CM | POA: Diagnosis not present

## 2024-01-29 DIAGNOSIS — A419 Sepsis, unspecified organism: Secondary | ICD-10-CM

## 2024-01-29 DIAGNOSIS — G9340 Encephalopathy, unspecified: Secondary | ICD-10-CM | POA: Diagnosis not present

## 2024-01-29 DIAGNOSIS — K922 Gastrointestinal hemorrhage, unspecified: Principal | ICD-10-CM

## 2024-01-29 DIAGNOSIS — N179 Acute kidney failure, unspecified: Secondary | ICD-10-CM | POA: Diagnosis not present

## 2024-01-29 DIAGNOSIS — J9601 Acute respiratory failure with hypoxia: Secondary | ICD-10-CM

## 2024-01-29 DIAGNOSIS — D72829 Elevated white blood cell count, unspecified: Secondary | ICD-10-CM | POA: Diagnosis not present

## 2024-01-29 LAB — POCT I-STAT 7, (LYTES, BLD GAS, ICA,H+H)
Acid-base deficit: 4 mmol/L — ABNORMAL HIGH (ref 0.0–2.0)
Bicarbonate: 18.7 mmol/L — ABNORMAL LOW (ref 20.0–28.0)
Calcium, Ion: 1.24 mmol/L (ref 1.15–1.40)
HCT: 23 % — ABNORMAL LOW (ref 36.0–46.0)
Hemoglobin: 7.8 g/dL — ABNORMAL LOW (ref 12.0–15.0)
O2 Saturation: 96 %
Patient temperature: 97.7
Potassium: 3.2 mmol/L — ABNORMAL LOW (ref 3.5–5.1)
Sodium: 135 mmol/L (ref 135–145)
TCO2: 19 mmol/L — ABNORMAL LOW (ref 22–32)
pCO2 arterial: 25.6 mmHg — ABNORMAL LOW (ref 32–48)
pH, Arterial: 7.47 — ABNORMAL HIGH (ref 7.35–7.45)
pO2, Arterial: 72 mmHg — ABNORMAL LOW (ref 83–108)

## 2024-01-29 LAB — CBC WITH DIFFERENTIAL/PLATELET
Abs Immature Granulocytes: 0.45 K/uL — ABNORMAL HIGH (ref 0.00–0.07)
Abs Immature Granulocytes: 0.46 K/uL — ABNORMAL HIGH (ref 0.00–0.07)
Basophils Absolute: 0 K/uL (ref 0.0–0.1)
Basophils Absolute: 0 K/uL (ref 0.0–0.1)
Basophils Relative: 0 %
Basophils Relative: 0 %
Eosinophils Absolute: 0.3 K/uL (ref 0.0–0.5)
Eosinophils Absolute: 0.3 K/uL (ref 0.0–0.5)
Eosinophils Relative: 1 %
Eosinophils Relative: 1 %
HCT: 24.8 % — ABNORMAL LOW (ref 36.0–46.0)
HCT: 23.4 % — ABNORMAL LOW (ref 36.0–46.0)
Hemoglobin: 8.2 g/dL — ABNORMAL LOW (ref 12.0–15.0)
Hemoglobin: 8.2 g/dL — ABNORMAL LOW (ref 12.0–15.0)
Immature Granulocytes: 2 %
Immature Granulocytes: 2 %
Lymphocytes Relative: 6 %
Lymphocytes Relative: 7 %
Lymphs Abs: 1.2 K/uL (ref 0.7–4.0)
Lymphs Abs: 1.4 K/uL (ref 0.7–4.0)
MCH: 31.3 pg (ref 26.0–34.0)
MCH: 31.9 pg (ref 26.0–34.0)
MCHC: 33.1 g/dL (ref 30.0–36.0)
MCHC: 35 g/dL (ref 30.0–36.0)
MCV: 94.7 fL (ref 80.0–100.0)
MCV: 91.1 fL (ref 80.0–100.0)
Monocytes Absolute: 0.7 K/uL (ref 0.1–1.0)
Monocytes Absolute: 0.8 K/uL (ref 0.1–1.0)
Monocytes Relative: 4 %
Monocytes Relative: 4 %
Neutro Abs: 17.3 K/uL — ABNORMAL HIGH (ref 1.7–7.7)
Neutro Abs: 16.2 K/uL — ABNORMAL HIGH (ref 1.7–7.7)
Neutrophils Relative %: 87 %
Neutrophils Relative %: 86 %
Platelets: 99 K/uL — ABNORMAL LOW (ref 150–400)
Platelets: 104 K/uL — ABNORMAL LOW (ref 150–400)
RBC: 2.62 MIL/uL — ABNORMAL LOW (ref 3.87–5.11)
RBC: 2.57 MIL/uL — ABNORMAL LOW (ref 3.87–5.11)
RDW: 13.2 % (ref 11.5–15.5)
RDW: 13.2 % (ref 11.5–15.5)
WBC: 19.9 K/uL — ABNORMAL HIGH (ref 4.0–10.5)
WBC: 19.2 K/uL — ABNORMAL HIGH (ref 4.0–10.5)
nRBC: 0 % (ref 0.0–0.2)
nRBC: 0 % (ref 0.0–0.2)

## 2024-01-29 LAB — CULTURE, OB URINE: Culture: 40000 — AB

## 2024-01-29 LAB — RENAL FUNCTION PANEL
Albumin: 2.3 g/dL — ABNORMAL LOW (ref 3.5–5.0)
Albumin: 2 g/dL — ABNORMAL LOW (ref 3.5–5.0)
Anion gap: 17 — ABNORMAL HIGH (ref 5–15)
Anion gap: 11 (ref 5–15)
BUN: 134 mg/dL — ABNORMAL HIGH (ref 8–23)
BUN: 108 mg/dL — ABNORMAL HIGH (ref 8–23)
CO2: 12 mmol/L — ABNORMAL LOW (ref 22–32)
CO2: 21 mmol/L — ABNORMAL LOW (ref 22–32)
Calcium: 9.4 mg/dL (ref 8.9–10.3)
Calcium: 8.4 mg/dL — ABNORMAL LOW (ref 8.9–10.3)
Chloride: 107 mmol/L (ref 98–111)
Chloride: 104 mmol/L (ref 98–111)
Creatinine, Ser: 3.54 mg/dL — ABNORMAL HIGH (ref 0.44–1.00)
Creatinine, Ser: 2.87 mg/dL — ABNORMAL HIGH (ref 0.44–1.00)
GFR, Estimated: 13 mL/min — ABNORMAL LOW (ref >60)
GFR, Estimated: 16 mL/min — ABNORMAL LOW (ref >60)
Glucose, Bld: 130 mg/dL — ABNORMAL HIGH (ref 70–99)
Glucose, Bld: 177 mg/dL — ABNORMAL HIGH (ref 70–99)
Phosphorus: 4.8 mg/dL — ABNORMAL HIGH (ref 2.5–4.6)
Phosphorus: 4.1 mg/dL (ref 2.5–4.6)
Potassium: 4.3 mmol/L (ref 3.5–5.1)
Potassium: 3.1 mmol/L — ABNORMAL LOW (ref 3.5–5.1)
Sodium: 136 mmol/L (ref 135–145)
Sodium: 136 mmol/L (ref 135–145)

## 2024-01-29 LAB — GLUCOSE, CAPILLARY
Glucose-Capillary: 144 mg/dL — ABNORMAL HIGH (ref 70–99)
Glucose-Capillary: 130 mg/dL — ABNORMAL HIGH (ref 70–99)
Glucose-Capillary: 128 mg/dL — ABNORMAL HIGH (ref 70–99)
Glucose-Capillary: 165 mg/dL — ABNORMAL HIGH (ref 70–99)
Glucose-Capillary: 176 mg/dL — ABNORMAL HIGH (ref 70–99)
Glucose-Capillary: 160 mg/dL — ABNORMAL HIGH (ref 70–99)

## 2024-01-29 LAB — MRSA NEXT GEN BY PCR, NASAL: MRSA by PCR Next Gen: DETECTED — AB

## 2024-01-29 LAB — CK: Total CK: 68 U/L (ref 38–234)

## 2024-01-29 MED ORDER — MIDODRINE HCL 5 MG PO TABS: 10.0000 mg | ORAL_TABLET | Freq: Three times a day (TID) | ORAL | Status: DC

## 2024-01-29 MED ORDER — HEPARIN (PORCINE) 2000 UNITS/L FOR CRRT
INTRAVENOUS_CENTRAL | Status: DC | PRN
Start: 2024-01-29 — End: 2024-01-31

## 2024-01-29 MED ORDER — PRISMASOL BGK 4/2.5 32-4-2.5 MEQ/L EC SOLN: Status: DC

## 2024-01-29 MED ORDER — MUPIROCIN 2 % EX OINT
TOPICAL_OINTMENT | Freq: Two times a day (BID) | CUTANEOUS | Status: DC
  Administered 2024-02-01: 1 via NASAL
  Filled 2024-01-29: qty 22

## 2024-01-29 MED ORDER — GERHARDT'S BUTT CREAM
TOPICAL_CREAM | Freq: Three times a day (TID) | CUTANEOUS | Status: DC
  Administered 2024-02-01: 1 via TOPICAL
  Filled 2024-01-29: qty 60

## 2024-01-29 MED ORDER — LACTATED RINGERS IV SOLN: INTRAVENOUS | Status: DC

## 2024-01-29 MED ORDER — LACTATED RINGERS IV BOLUS
1000.0000 mL | Freq: Once | INTRAVENOUS | Status: AC
  Administered 2024-01-29: 1000 mL via INTRAVENOUS

## 2024-01-29 MED ORDER — SODIUM BICARBONATE 8.4 % IV SOLN
INTRAVENOUS | Status: DC
  Filled 2024-01-29: qty 1000
  Filled 2024-01-29: qty 150
  Filled 2024-01-29: qty 1000

## 2024-01-29 MED ORDER — SODIUM CHLORIDE 0.9 % IV SOLN
2.0000 g | Freq: Two times a day (BID) | INTRAVENOUS | Status: DC
  Administered 2024-01-29 – 2024-01-30 (×2): 2 g via INTRAVENOUS
  Filled 2024-01-29 (×2): qty 12.5

## 2024-01-29 MED ORDER — FENTANYL CITRATE PF 50 MCG/ML IJ SOSY
25.0000 ug | PREFILLED_SYRINGE | Freq: Once | INTRAMUSCULAR | Status: AC
  Administered 2024-01-29: 25 ug via INTRAVENOUS
  Filled 2024-01-29: qty 1

## 2024-01-29 MED ORDER — ACETAMINOPHEN 10 MG/ML IV SOLN
1000.0000 mg | Freq: Once | INTRAVENOUS | Status: AC
  Administered 2024-01-29: 1000 mg via INTRAVENOUS
  Filled 2024-01-29: qty 100

## 2024-01-29 MED ORDER — HEPARIN SODIUM (PORCINE) 1000 UNIT/ML DIALYSIS: 1000.0000 [IU] | INTRAMUSCULAR | Status: DC | PRN

## 2024-01-29 NOTE — Progress Notes (Signed)
 Spoke with the patient's daughter, Katana [name], via phone to obtain consent for the placement of a hemodialysis (HD) line due to acute renal failure. The patient was correctly identified by the daughter using a two-identifier process. Verbal consent for HD line placement was obtained.  Ms. [name] also informed us  that the patient has a living will indicating Do Not Resuscitate (DNR) status. She is unable to come in person to provide documentation due to living approximately three hours away, but she has opted to email us  the paperwork for verification.  All her previous code status had been documented as FULL CODE     Drue Lisa Grow MD 01/29/2024, 12:37 PM

## 2024-01-29 NOTE — Procedures (Signed)
 Central Venous Catheter Insertion Procedure Note  Jodel Mayhall [facility] [name]  [id number]  1946-05-06  Date:01/29/24  Time:2:48 PM   Provider Performing:Malacai Grantz and Toribio Sharps  Procedure: Insertion of Non-tunneled Central Venous Catheter(36556)with US  guidance (23062)    Indication(s) CRRT   Consent Risks of the procedure as well as the alternatives and risks of each were explained to the patient and/or caregiver.  Consent for the procedure was obtained and is signed in the bedside chart  Anesthesia Topical only with 1% lidocaine    Timeout Verified patient identification, verified procedure, site/side was marked, verified correct patient position, special equipment/implants available, medications/allergies/relevant history reviewed, required imaging and test results available.  Sterile Technique Maximal sterile technique including full sterile barrier drape, hand hygiene, sterile gown, sterile gloves, mask, hair covering, sterile ultrasound probe cover (if used).  Procedure Description Area of catheter insertion was cleaned with chlorhexidine  and draped in sterile fashion.   With real-time ultrasound guidance a HD catheter was placed into the right internal jugular vein.  Nonpulsatile blood flow and easy flushing noted in all ports.  The catheter was sutured in place and sterile dressing applied.  Complications/Tolerance None; patient tolerated the procedure well. Chest X-ray is ordered to verify placement for internal jugular or subclavian cannulation.  Chest x-ray is not ordered for femoral cannulation.  EBL Minimal  Specimen(s) None

## 2024-01-29 NOTE — Evaluation (Signed)
 Clinical/Bedside Swallow Evaluation Patient Details  Name: [name] MRN: [id number] Date of Birth: [birth year]  Today's Date: 01/29/2024 Time: SLP Start Time (ACUTE ONLY): 0740 SLP Stop Time (ACUTE ONLY): 0749 SLP Time Calculation (min) (ACUTE ONLY): 9 min  Past Medical History:  Past Medical History:  Diagnosis Date   Benign positional vertigo 03/04/2016   Brain injury [facility])    CHF (congestive heart failure) (HCC)    Climacteric    Depression    DJD (degenerative joint disease)    lumbar and cervical   Dyslipidemia    GERD (gastroesophageal reflux disease)    Hypertension    Hypokalemia    Hypothyroidism    IBS (irritable bowel syndrome)    OAB (overactive bladder)    Ovarian cyst 03/25/2015   3cm simple cyst on right ovary   Rhinitis    Sleep apnea    Spasm of thoracolumbar muscle    Past Surgical History:  Past Surgical History:  Procedure Laterality Date   ABDOMINAL HYSTERECTOMY  07/12/2014   ANKLE RECONSTRUCTION Left    BRACHIOPLASTY     BREAST REDUCTION SURGERY Bilateral 2020   CATARACT EXTRACTION     CHOLECYSTECTOMY     COLONOSCOPY WITH PROPOFOL  N/A 12/07/2016   Procedure: COLONOSCOPY WITH PROPOFOL ;  Surgeon: Viktoria Lamar DASEN, MD;  Location: [facility] ENDOSCOPY;  Service: Endoscopy;  Laterality: N/A;   JOINT REPLACEMENT Bilateral 07/2014, 03/2015   nasal reconstrucion     REDUCTION MAMMAPLASTY Bilateral    SHOULDER OPEN ROTATOR CUFF REPAIR     SOFT TISSUE TUMOR RESECTION     excision tumor soft tissue neck, and soft tissue subcutaneous axilla   TOE SURGERY Right    reconstruction right foot 4th and 5th toe   HPI:  78 y.o. female with history of hypothyroidism, chronic respiratory failure at baseline on 3 L oxygen with history of chronic diastolic CHF, DVT on Eliquis, hypertension, GERD, dementia who was recently admitted to the hospital in [place]  for E. coli bacteremia sepsis in the setting of COVID infection was discharged on January 17, 2024 as per the discharge summary on Levaquin to complete the course for the E. coli bacteremia not sure how long the patient was on Levaquin was transferred to [facility], ER after patient was noticed to have bleeding from the rectum and Foley catheter. Pt also found to have increased BUN and being treated for sepsis of unknown origin.    Assessment / Plan / Recommendation  Clinical Impression  Pt presents with a cognitive based dysphagia presently. Mentation remains altered, pt being treated for sepsis of unknown origin. Pt with some grunting to verbal and multimodal cueing but unable to open eyes or follow commands. Completed diligent oral care, pt with dried secretions on tongue, lips, and roof of mouth (poor management of saliva); improving post oral care by SLP. Blood tinged saliva dried noted and some oral bleeding noted with gentle oral care. Trialed single ice chip via tsp. Pt with no attempts for bolus manipulation or bolus propulsion, prompting extraction of ice chip by SLP. No palpable swallow initiation noted. Recommned continue NPO with oral care QID and meds via alternative means. SLP to closely monitor for PO readiness.  SLP Visit Diagnosis: Dysphagia, unspecified (R13.10);Dysphagia, oral phase (R13.11)    Aspiration Risk  Moderate aspiration risk;Risk for inadequate nutrition/hydration    Diet Recommendation   NPO  Medication Administration: Via alternative means    Other  Recommendations Oral Care Recommendations: Oral care QID  Assistance Recommended at Discharge    Functional Status Assessment Patient has had a recent decline in their functional status and/or demonstrates limited ability to make significant improvements in function in a reasonable and predictable amount of time  Frequency and Duration min 2x/week  2 weeks       Prognosis Prognosis for improved oropharyngeal function: Fair Barriers to Reach Goals: Time post onset;Severity of deficits;Cognitive  deficits      Swallow Study   General Date of Onset: 01/27/24 HPI: 78 y.o. female with history of hypothyroidism, chronic respiratory failure at baseline on 3 L oxygen with history of chronic diastolic CHF, DVT on Eliquis, hypertension, GERD, dementia who was recently admitted to the hospital in [place]  for E. coli bacteremia sepsis in the setting of COVID infection was discharged on January 17, 2024 as per the discharge summary on Levaquin to complete the course for the E. coli bacteremia not sure how long the patient was on Levaquin was transferred to [facility], ER after patient was noticed to have bleeding from the rectum and Foley catheter. Pt also found to have increased BUN and being treated for sepsis of unknown origin. Type of Study: Bedside Swallow Evaluation Previous Swallow Assessment: clinical swallow eval 2022 unremarkable Diet Prior to this Study: NPO Temperature Spikes Noted: No Respiratory Status: Nasal cannula History of Recent Intubation: No Behavior/Cognition: Lethargic/Drowsy;Doesn't follow directions;Requires cueing Oral Cavity Assessment: Dry;Dried secretions Oral Care Completed by SLP: Yes Oral Cavity - Dentition: Missing dentition Vision: Impaired for self-feeding Self-Feeding Abilities: Total assist Patient Positioning: Upright in bed Baseline Vocal Quality: Other (comment) (minimal grunting no intentional phonation ellicited despite cueing) Volitional Cough: Cognitively unable to elicit Volitional Swallow: Unable to elicit    Oral/Motor/Sensory Function Overall Oral Motor/Sensory Function: Generalized oral weakness   Ice Chips Ice chips: Impaired Presentation: Spoon Oral Phase Impairments: Poor awareness of bolus;Reduced labial seal Oral Phase Functional Implications: Oral holding;Other (comment) (SLP extracted single ice chip from oral cavity after no bolus manipulation from pt) Pharyngeal Phase Impairments: Unable to trigger swallow   Thin Liquid  Thin Liquid: Not tested    Nectar Thick Nectar Thick Liquid: Not tested   Honey Thick Honey Thick Liquid: Not tested   Puree Puree: Not tested   Solid     Solid: Not tested      Mitzie HUNT MA, CCC-SLP Acute Rehabilitation Services   01/29/2024,7:59 AM

## 2024-01-29 NOTE — Progress Notes (Signed)
 PT Cancellation Note  Patient Details Name: [name] MRN: [id number] DOB: 09-30-1945   Cancelled Treatment:    Reason Eval/Treat Not Completed: Other (comment) (RN deferred PT at this time secondary to patient with poor arousal and abnormal labs. Will continue attempts for PT evaluation tomorrow.)   Sherryle VEAR [place] 01/29/2024, 11:03 AM

## 2024-01-29 NOTE — Plan of Care (Signed)
  Problem: Coping: Goal: Ability to adjust to condition or change in health will improve Outcome: Progressing   Problem: Skin Integrity: Goal: Risk for impaired skin integrity will decrease Outcome: Progressing   Problem: Clinical Measurements: Goal: Will remain free from infection Outcome: Progressing Goal: Diagnostic test results will improve Outcome: Progressing Goal: Respiratory complications will improve Outcome: Progressing   Problem: Elimination: Goal: Will not experience complications related to urinary retention Outcome: Progressing

## 2024-01-29 NOTE — Progress Notes (Signed)
 NAME:  Macon J Hollifield [name], MRN:  [id number], DOB:  05-18-1946, LOS: 1 ADMISSION DATE:  01/27/2024, CONSULTATION DATE:  8/15 REFERRING MD:  Dr. Franky, CHIEF COMPLAINT:  hematuria/rectal bleeding; encephalopathy   History of Present Illness:  Patient is a 78 year old female with pertinent PMH chronic respiratory failure on 3L [place], chronic diastolic CHF, DVT on Eliquis, hypothyroidism, HTN, dementia presents to [facility] on 8/15 with hematuria/rectal bleeding.  Patient recently admitted to [facility] with E. coli bacteremia and COVID.  Discharged on 01/17/2024.  Was sent to rehab facility on a course of Levaquin.  On 8/15 patient having bleeding from Foley catheter and rectum.  Brought to [facility] ED for further eval.  On arrival patient altered.  Sats stable on 4L [place].  Hgb 11.6, INR 1.9, PTT 23.  Afebrile but WBC 22.  CT chest/abdomen/pelvis no acute abnormality.  LA WNL.  Given IV fluids, cultures obtained, and placed on broad-spectrum antibiotics.  Troponins mildly elevated 84 then 93.  Creat 3.09 and BUN 127.  Nephro consulted.  CT head no acute abnormality.  Ammonia, TSH, VBG WNL.  TRH to admit.  Given encephalopathy PCCM consulted.  Pertinent  Medical History   Past Medical History:  Diagnosis Date   Benign positional vertigo 03/04/2016   Brain injury [facility])    CHF (congestive heart failure) (HCC)    Climacteric    Depression    DJD (degenerative joint disease)    lumbar and cervical   Dyslipidemia    GERD (gastroesophageal reflux disease)    Hypertension    Hypokalemia    Hypothyroidism    IBS (irritable bowel syndrome)    OAB (overactive bladder)    Ovarian cyst 03/25/2015   3cm simple cyst on right ovary   Rhinitis    Sleep apnea    Spasm of thoracolumbar muscle      Significant Hospital Events: Including procedures, antibiotic start and stop dates in addition to other pertinent events   8/15 admit for management of acute AKI superimposed on CKD stage III AA and  possible sepsis 8/16 worsening mentation and renal function prompting PCCM consult transferred to ICU  Interim History / Subjective:  Minimally responsive on BiPAP  Objective    Blood pressure (!) 109/49, pulse 83, temperature 98.7 F (37.1 C), temperature source Oral, resp. rate (!) 9, height 5' 6 (1.676 m), weight 131.5 kg, SpO2 100%.    FiO2 (%):  [30 %] 30 %   Intake/Output Summary (Last 24 hours) at 01/29/2024 1055 Last data filed at 01/29/2024 0700 Gross per 24 hour  Intake --  Output 250 ml  Net -250 ml   Filed Weights   01/27/24 1719  Weight: 131.5 kg    Examination: General: Acute on chronic ill-appearing morbidly obese elderly female lying in bed on BiPAP in no acute distress  HEENT: BiPAP mask in place, MM pink/moist, PERRL,  Neuro: Groans to painful stimuli CV: s1s2 regular rate and rhythm, no murmur, rubs, or gallops,  PULM: Diminished bilaterally, no increased work of breathing, tolerating BiPAP GI: soft, bowel sounds active in all 4 quadrants, non-tender, non-distended Extremities: warm/dry, no edema  Skin: no rashes or lesions'  Resolved problem list   Assessment and Plan   Acute encephalopathy  -Multifactorial including AKI with significant uremia and possible sepsis -CT head no acute abnormality; vbg, ammonia, tsh wnl Hx of dementia P: Maintain neuro protective measures; goal for eurothermia, euglycemia, eunatermia, normoxia, and PCO2 goal of 35-40 Nutrition and bowel regiment  Seizure  precautions  Aspirations precautions  Continue medical management Delirium precautions  Sepsis likely secondary to complicated UTI -UA positive for UTI with significant leukocytosis P: Continue cefepime  with a recent history of E. coli bacteremia Follow cultures Monitor for need to initiate vasopressor support given hypotension  Chronic respiratory failure on 3 LNC baseline Hx of OSA P: BiPAP in place currently, continue Aspiration  precautions N.p.o. 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 - Baseline creatinine 1.30 now up to 3.3 with BUN 134 P: Nephrology following, appreciate assistance Transfer to ICU for initiation of CRRT Place HD catheter on arrival to ICU Follow renal function Avoid nephrotoxic agents  History of chronic HFpEF - Echo 8/15 with poor windows unable to fully evaluate but LVEF appears within normal with inability to evaluate WMA, RV not well visualized Essential hypertension Slightly elevated troponin P: Continuous telemetry Hold antihypertensives given borderline hypotension Optimize electrolytes Strict intake and output Goal of euvolemia  Lower GI bleed -Reported bright red blood per rectum initially on presentation with some dark-colored stool since admission P: Mentation status currently precludes any safe endoscopic evaluation Eliquis remains on hold Trend CBC Transfuse per protocol Hemoglobin goal greater than 7  Hematuria - Appears to have resolved P: Monitor for signs of recurrence Anticoagulation on hold as above  History of DVT P: Anticoagulation on hold as above  Obesity At risk malnutrition - BMI 46 P: Optimize nutrition, patient currently n.p.o. due to poor mentation  Goals of care Per [facility] attending daughter was updated regarding change in clinical status, she states patient has advanced directives but she does not have access to these documents currently decision was made to continue all aggressive interventions and remain full code until family can obtain advanced directives  Best Practice (right click and Reselect all SmartList Selections daily)   Diet/type: NPO DVT prophylaxis SCD Pressure ulcer(s): N/A GI prophylaxis: PPI Lines: N/A Foley:  N/A Code Status:  full code Last date of multidisciplinary goals of care discussion: Pending  Critical care time:    CRITICAL CARE Performed by: Trinidad Petron D. Harris   Total critical care time: 40 minutes  Critical care time was exclusive of separately billable procedures and treating other patients.  Critical care was necessary to treat or prevent imminent or life-threatening deterioration.  Critical care was time spent personally by me on the following activities: development of treatment plan with patient and/or surrogate as well as nursing, discussions with consultants, evaluation of patient's response to treatment, examination of patient, obtaining history from patient or surrogate, ordering and performing treatments and interventions, ordering and review of laboratory studies, ordering and review of radiographic studies, pulse oximetry and re-evaluation of patient's condition.  Astaria Nanez D. Harris, NP-C [facility] Personal contact information can be found on Amion  If no contact or response made please call [phone] 01/29/2024, 11:26 AM

## 2024-01-29 NOTE — Progress Notes (Signed)
   01/29/24 2010  BiPAP/CPAP/SIPAP  BiPAP/CPAP/SIPAP Pt Type Adult  Reason BIPAP/CPAP not in use Other(comment) (right pneumothorax)  BiPAP/CPAP /SiPAP Vitals  Pulse Rate 87  Resp (!) 9  SpO2 100 %  MEWS Score/Color  MEWS Score 2  MEWS Score Color Yellow

## 2024-01-29 NOTE — Progress Notes (Signed)
 Pt transported to 2M03 via bipap, no resp distress noted, ICU RT in room w/ patient now.

## 2024-01-29 NOTE — Progress Notes (Signed)
 Pt placed on cpap per MD order.  Currenlty pt appears to tolerate well, sat 100%. Pt does wake up to name being called.  RN in room currently.

## 2024-01-29 NOTE — Progress Notes (Signed)
 Per discussion w/ MD, the ABG order is to be done in a couple of hours.

## 2024-01-29 NOTE — Progress Notes (Addendum)
 PROGRESS NOTE        PATIENT DETAILS Name: [name] Age: 78 y.o. Sex: female Date of Birth: 21-Mar-1946 Admit Date: 01/27/2024 Admitting Physician Redia LOISE Cleaver, MD ERE:[email or web address], Layman ORN, MD  Brief Summary: Patient is a 78 y.o.  female with history of chronic HFpEF, VTE on Eliquis, HTN, chronic hypoxic respiratory failure on 2-3 L of oxygen at home, dementia-who was recently hospitalized in [place], [place] for sepsis related to E. coli bacteremia subsequently discharged to [facility] from SNF rehab for confusion, bleeding around rectum and Foley catheter.  Patient was found to have acute metabolic encephalopathy with with AKI-sepsis physiology and subsequently admitted to the hospitalist service.  Significant events: 8/15>> admit to [facility]  Significant studies: 8/15>> CT chest/abdomen/pelvis: No acute abnormalities. 8/15>> CT head: No acute abnormality. 8/15>> renal ultrasound: No hydronephrosis. 8/15>> echo: EF-unable to be evaluated-RV systolic function not visualized. 8/15>> B/L lower extremity Doppler: Negative for DVT.  Significant microbiology data: 8/14>> urine culture: No growth 8/15>> respiratory virus panel: Negative 8/15>> urine culture: Pending  Procedures: None  Consults: Nephrology  Subjective: Sleeping-easily arousable-opens eyes-able to talk in slow soft  short sentences.  Per night nurse-had dark-colored stools overnight.  Objective: Vitals: Blood pressure (!) 98/44, pulse 87, temperature 98.7 F (37.1 C), temperature source Oral, resp. rate 11, height 5' 6 (1.676 m), weight 131.5 kg, SpO2 100%.   Exam: Gen Exam: Lethargic but arousable-not in any distress. HEENT:atraumatic, normocephalic Chest: B/L clear to auscultation anteriorly CVS:S1S2 regular Abdomen:soft non tender, non distended Extremities: Has extensive ecchymosis/swelling of bilateral upper arms. Neurology: Difficult exam-has generalized  weakness-lethargic limits exam but moves all 4 extremities somewhat. Skin: no rash  Pertinent Labs/Radiology:    Latest Ref Rng & Units 01/28/2024    4:10 AM 01/28/2024    4:09 AM 01/27/2024    5:50 PM  CBC  WBC 4.0 - 10.5 K/uL  21.7    Hemoglobin 12.0 - 15.0 g/dL  89.3  88.3   Hematocrit 36.0 - 46.0 %  32.1  34.0   Platelets 150 - 400 K/uL 188  194      Lab Results  Component Value Date   NA 134 (L) 01/28/2024   K 4.0 01/28/2024   CL 99 01/28/2024   CO2 19 (L) 01/28/2024      Assessment/Plan: Acute metabolic encephalopathy Multifactorial etiology-AKI-possible sepsis related to complicated UTI. Remains somnolent but arousable-follows commands very slowly. Continue to treat underlying etiologies Delirium precautions Supportive care  Sepsis likely secondary to complicated UTI UA+ UTI-significant leukocytosis-no other source apparent Continue cefepime  (recent history of E. coli bacteremia) Should be okay to discontinue Zyvox  today. Follow cultures.  AKI on CKD stage IIIb Suspected hemodynamically mediated kidney injury Await repeat labs this morning Avoid nephrotoxic agents Nephrology following.  Addendum Continues to be encephalopathic-creatinine 3.54-oliguric-bicarb 12-discussed with Dr. Barney blood pressure soft-recommends transfer to the ICU for CRRT.  I subsequently reached out to patient's stepdaughter Edana-she understands tenuous situation-explained rationale for ICU transfer-potential need for CRRT.  Per Dorrene-patient has advanced directives-but it is in the house-she is currently 1-2 hours away.  Per Ashonte-patient would not want permanent life support-she is not sure about temporary CRRT etc.  For now-we have agreed to continue full care-including ICU transfer/CRRT, once Stefany reviews the advance directives-she will call us  and let us  know.  Lower GI bleeding Apparently had  bright red rectal bleeding on initial presentation-some dark-colored stools  overnight Currently mental status precludes any safe endoscopy evaluation- Eliquis on hold-she appears stable hemodynamically-await Hb will watch closely-and consult GI once she is a bit more stable.  Hematuria Seems to have resolved Clean amber-colored urine noted in Foley catheter.  History of VTE Eliquis on hold given hematuria on presentation/and possible GI bleed.  Chronic HFpEF Hard to assess volume status-she is obese-and has significant  ecchymosis-but suspect she is relatively stable volume wise Diuretics on hold due to AKI/soft blood pressure.  Minimally elevated troponins Demand ischemia Echo-unable to assess EF/wall motion Given overall medical situation-doubt further workup is required for this minimally elevated troponin.  Hypertension All antihypertensives on hold-BP soft Start midodrine  AM cortisol with tomorrow morning's lab.  Chronic hypoxic respiratory failure on 2-3 L of oxygen OSA Supportive care As needed bronchodilators Resume CPAP  History of depression Effexor /trazodone  held due to somnolence Resume when able  Dementia Aricept  Delirium precautions  Class 3 Obesity: Estimated body mass index is 46.81 kg/m as calculated from the following:   Height as of this encounter: 5' 6 (1.676 m).   Weight as of this encounter: 131.5 kg.   Code status:   Code Status: Full Code   DVT Prophylaxis:SCD's  Family Communication: None at bedside   Disposition Plan: Status is: Inpatient Remains inpatient appropriate because: Severity of illness   Planned Discharge Destination:Skilled nursing facility   Diet: Diet Order             Diet NPO time specified  Diet effective now                     Antimicrobial agents: Anti-infectives (From admission, onward)    Start     Dose/Rate Route Frequency Ordered Stop   01/28/24 0400  linezolid  (ZYVOX ) IVPB 600 mg        600 mg 300 mL/hr over 60 Minutes Intravenous Every 12 hours 01/28/24 0326      01/28/24 0400  ceFEPIme  (MAXIPIME ) 2 g in sodium chloride  0.9 % 100 mL IVPB        2 g 200 mL/hr over 30 Minutes Intravenous Every 24 hours 01/28/24 0332          MEDICATIONS: Scheduled Meds:  Chlorhexidine  Gluconate Cloth  6 each Topical Daily   insulin  aspart  0-6 Units Subcutaneous Q4H   pantoprazole  (PROTONIX ) IV  40 mg Intravenous Q12H   thiamine  (VITAMIN B1) injection  100 mg Intravenous Daily   Continuous Infusions:  ceFEPime  (MAXIPIME ) IV Stopped (01/29/24 0515)   linezolid  (ZYVOX ) IV Stopped (01/28/24 2300)   PRN Meds:.ondansetron  (ZOFRAN ) IV   I have personally reviewed following labs and imaging studies  LABORATORY DATA: CBC: Recent Labs  Lab 01/27/24 1631 01/27/24 1723 01/27/24 1750 01/28/24 0409 01/28/24 0410  WBC  --  22.2*  --  21.7*  --   NEUTROABS  --  20.3*  --  19.2*  --   HGB 10.2* 11.6* 11.6* 10.6*  --   HCT 30.0* 33.6* 34.0* 32.1*  --   MCV  --  92.1  --  95.8  --   PLT  --  204  --  194 188    Basic Metabolic Panel: Recent Labs  Lab 01/27/24 1631 01/27/24 1723 01/27/24 1750 01/28/24 0409  NA 132* 131* 132* 134*  K 4.9 4.3 4.1 4.0  CL 97* 95*  --  99  CO2  --  21*  --  19*  GLUCOSE 232* 241*  --  240*  BUN 126* 127*  --  129*  CREATININE 3.60* 3.09*  --  3.27*  CALCIUM  --  10.3  --  9.8  MG  --   --   --  2.1  PHOS  --   --   --  5.2*    GFR: Estimated Creatinine Clearance: 19.7 mL/min (A) (by C-G formula based on SCr of 3.27 mg/dL (H)).  Liver Function Tests: Recent Labs  Lab 01/27/24 1723 01/28/24 0409  AST 26 21  ALT 17 15  ALKPHOS 43 42  BILITOT 0.9 1.1  PROT 6.3* 5.8*  ALBUMIN  3.3* 3.0*   No results for input(s): LIPASE, AMYLASE in the last 168 hours. Recent Labs  Lab 01/27/24 2343  AMMONIA 30    Coagulation Profile: Recent Labs  Lab 01/27/24 1723 01/28/24 0410  INR 1.9* 1.9*    Cardiac Enzymes: Recent Labs  Lab 01/28/24 0409 01/29/24 0649  CKTOTAL 98 68    BNP (last 3 results) No  results for input(s): PROBNP in the last 8760 hours.  Lipid Profile: No results for input(s): CHOL, HDL, LDLCALC, TRIG, CHOLHDL, LDLDIRECT in the last 72 hours.  Thyroid Function Tests: Recent Labs    01/27/24 2343  TSH 0.782    Anemia Panel: Recent Labs    01/27/24 2343 01/28/24 0410  VITAMINB12 497  --   FOLATE 13.5  --   FERRITIN 408*  --   TIBC 335  --   IRON 98  --   RETICCTPCT  --  1.3    Urine analysis:    Component Value Date/Time   COLORURINE YELLOW 01/28/2024 1203   APPEARANCEUR HAZY (A) 01/28/2024 1203   LABSPEC 1.014 01/28/2024 1203   PHURINE 5.0 01/28/2024 1203   GLUCOSEU NEGATIVE 01/28/2024 1203   HGBUR SMALL (A) 01/28/2024 1203   BILIRUBINUR NEGATIVE 01/28/2024 1203   KETONESUR NEGATIVE 01/28/2024 1203   PROTEINUR 30 (A) 01/28/2024 1203   NITRITE NEGATIVE 01/28/2024 1203   LEUKOCYTESUR LARGE (A) 01/28/2024 1203    Sepsis Labs: Lactic Acid, Venous    Component Value Date/Time   LATICACIDVEN 1.3 01/27/2024 1845    MICROBIOLOGY: Recent Results (from the past 240 hours)  Culture, blood (Routine X 2) w Reflex to ID Panel     Status: None (Preliminary result)   Collection Time: 01/27/24 11:30 PM   Specimen: BLOOD  Result Value Ref Range Status   Specimen Description BLOOD SITE NOT SPECIFIED  Final   Special Requests   Final    BOTTLES DRAWN AEROBIC AND ANAEROBIC Blood Culture results may not be optimal due to an inadequate volume of blood received in culture bottles   Culture   Final    NO GROWTH 1 DAY Performed at [facility], [address], [place], [place] [address]    Report Status PENDING  Incomplete  Culture, blood (Routine X 2) w Reflex to ID Panel     Status: None (Preliminary result)   Collection Time: 01/27/24 11:35 PM   Specimen: BLOOD  Result Value Ref Range Status   Specimen Description BLOOD SITE NOT SPECIFIED  Final   Special Requests   Final    BOTTLES DRAWN AEROBIC AND ANAEROBIC Blood Culture results  may not be optimal due to an inadequate volume of blood received in culture bottles   Culture   Final    NO GROWTH 1 DAY Performed at [facility], [address] [place], [place] [address]  Report Status PENDING  Incomplete  Respiratory (~20 pathogens) panel by PCR     Status: None   Collection Time: 01/28/24  5:59 AM   Specimen: Nasopharyngeal Swab; Respiratory  Result Value Ref Range Status   Adenovirus NOT DETECTED NOT DETECTED Final   Coronavirus 229E NOT DETECTED NOT DETECTED Final    Comment: (NOTE) The Coronavirus on the Respiratory Panel, DOES NOT test for the novel  Coronavirus (2019 nCoV)    Coronavirus HKU1 NOT DETECTED NOT DETECTED Final   Coronavirus NL63 NOT DETECTED NOT DETECTED Final   Coronavirus OC43 NOT DETECTED NOT DETECTED Final   Metapneumovirus NOT DETECTED NOT DETECTED Final   Rhinovirus / Enterovirus NOT DETECTED NOT DETECTED Final   Influenza A NOT DETECTED NOT DETECTED Final   Influenza B NOT DETECTED NOT DETECTED Final   Parainfluenza Virus 1 NOT DETECTED NOT DETECTED Final   Parainfluenza Virus 2 NOT DETECTED NOT DETECTED Final   Parainfluenza Virus 3 NOT DETECTED NOT DETECTED Final   Parainfluenza Virus 4 NOT DETECTED NOT DETECTED Final   Respiratory Syncytial Virus NOT DETECTED NOT DETECTED Final   Bordetella pertussis NOT DETECTED NOT DETECTED Final   Bordetella Parapertussis NOT DETECTED NOT DETECTED Final   Chlamydophila pneumoniae NOT DETECTED NOT DETECTED Final   Mycoplasma pneumoniae NOT DETECTED NOT DETECTED Final    Comment: Performed at [facility], [address], [place], [place] [address]    RADIOLOGY STUDIES/RESULTS: VAS US  LOWER EXTREMITY VENOUS (DVT) Result Date: 01/28/2024  Lower Venous DVT Study Patient Name:  JEANNEMARIE SAWAYA  Date of Exam:   01/28/2024 Medical Rec #: [id number]                      Accession #:    [id number] Date of Birth: 28-Jun-1945                       Patient Gender: F Patient Age:    59 years Exam Location:  [facility] Procedure:      VAS US  LOWER EXTREMITY VENOUS (DVT) Referring Phys: REDIA CLEAVER --------------------------------------------------------------------------------  Indications: Edema.  Anticoagulation: Eliquis. Limitations: Body habitus, poor ultrasound/tissue interface and apneic breathing, small vessels. Comparison Study: Previous exam (reflux) on 03/06/2020 was negative for DVT. Performing Technologist: Ezzie Potters RVT, RDMS  Examination Guidelines: A complete evaluation includes B-mode imaging, spectral Doppler, color Doppler, and power Doppler as needed of all accessible portions of each vessel. Bilateral testing is considered an integral part of a complete examination. Limited examinations for reoccurring indications may be performed as noted. The reflux portion of the exam is performed with the patient in reverse Trendelenburg.  +--------+---------------+---------+-----------+----------+--------------------+ RIGHT   CompressibilityPhasicitySpontaneityPropertiesThrombus Aging       +--------+---------------+---------+-----------+----------+--------------------+ CFV     Full           Yes      Yes                                       +--------+---------------+---------+-----------+----------+--------------------+ SFJ     Full                                                              +--------+---------------+---------+-----------+----------+--------------------+  FV Prox Full           Yes      Yes                                       +--------+---------------+---------+-----------+----------+--------------------+ FV Mid  Full           Yes      Yes                                       +--------+---------------+---------+-----------+----------+--------------------+ FV                     Yes      Yes                  patent by            Distal                                               color/doppler         +--------+---------------+---------+-----------+----------+--------------------+ PFV     Full                                                              +--------+---------------+---------+-----------+----------+--------------------+ POP     Full           Yes      Yes                                       +--------+---------------+---------+-----------+----------+--------------------+ PTV                                                  not visualized       +--------+---------------+---------+-----------+----------+--------------------+ PERO                                                 not visualized       +--------+---------------+---------+-----------+----------+--------------------+   +---------+---------------+---------+-----------+----------+-------------------+ LEFT     CompressibilityPhasicitySpontaneityPropertiesThrombus Aging      +---------+---------------+---------+-----------+----------+-------------------+ CFV      Full           Yes      Yes                                      +---------+---------------+---------+-----------+----------+-------------------+ SFJ      Full                                                             +---------+---------------+---------+-----------+----------+-------------------+  FV Prox  Full           Yes      Yes                                      +---------+---------------+---------+-----------+----------+-------------------+ FV Mid   Full           Yes      Yes                  Not well visualized +---------+---------------+---------+-----------+----------+-------------------+ FV DistalFull           Yes      Yes                                      +---------+---------------+---------+-----------+----------+-------------------+ PFV      Full                                                             +---------+---------------+---------+-----------+----------+-------------------+  POP      Full           Yes      Yes                                      +---------+---------------+---------+-----------+----------+-------------------+ PTV                                                   not visualized      +---------+---------------+---------+-----------+----------+-------------------+ PERO                                                  not visualized      +---------+---------------+---------+-----------+----------+-------------------+     Summary: BILATERAL: - No evidence of deep vein thrombosis seen in the lower extremities, bilaterally (in areas visualized). -No evidence of popliteal cyst, bilaterally.   *See table(s) above for measurements and observations. Electronically signed by Penne Colorado MD on 01/28/2024 at 6:54:16 PM.    Final    ECHOCARDIOGRAM COMPLETE Result Date: 01/28/2024    ECHOCARDIOGRAM REPORT   Patient Name:   SHIRLEEN MCFAUL Date of Exam: 01/28/2024 Medical Rec #:  [id number]                     Height:       66.0 in Accession #:    [id number]                    Weight:       290.0 lb Date of Birth:  [birth year]                      BSA:          2.342 m Patient Age:    19 years  BP:           122/59 mmHg Patient Gender: F                             HR:           78 bpm. Exam Location:  Inpatient Procedure: 2D Echo, Cardiac Doppler and Color Doppler (Both Spectral and Color            Flow Doppler were utilized during procedure). Indications:    Elevated Troponin  History:        Patient has no prior history of Echocardiogram examinations.                 CHF; Risk Factors:Hypertension and Sleep Apnea.  Sonographer:    Jayson Gaskins Referring Phys: [phone] REDIA SAILOR [facility]  Sonographer Comments: Suboptimal parasternal window, no apical window, no subcostal window and patient is obese. IMPRESSIONS  1. Poor acoustic windows severely limit study Overall LVEF appears normal. Cannot fully evaluate regional wall motion due  to limited views.  2. Right ventricular systolic function was not well visualized. The right ventricular size is normal.  3. The mitral valve was not well visualized. No evidence of mitral valve regurgitation.  4. The aortic valve was not well visualized. Aortic valve regurgitation is not visualized. FINDINGS  Left Ventricle: Poor acoustic windows severely limit study Overall LVEF appears normal. Cannot fully evaluate regional wall motion due to limited views. The left ventricular internal cavity size was normal in size. Eccentric left ventricular hypertrophy. Right Ventricle: The right ventricular size is normal. Right vetricular wall thickness was not assessed. Right ventricular systolic function was not well visualized. Left Atrium: Left atrial size was not well visualized. Right Atrium: Right atrial size was not well visualized. Pericardium: There is no evidence of pericardial effusion. Presence of epicardial fat layer. Mitral Valve: The mitral valve was not well visualized. No evidence of mitral valve regurgitation. Tricuspid Valve: The tricuspid valve is not well visualized. No evidence of tricuspid stenosis. Aortic Valve: The aortic valve was not well visualized. Aortic valve regurgitation is not visualized. Pulmonic Valve: The pulmonic valve was not assessed. Pulmonic valve regurgitation is not visualized. Aorta: The aortic root was not well visualized. IAS/Shunts: The interatrial septum was not assessed.  LEFT VENTRICLE PLAX 2D LVIDd:         4.20 cm LVIDs:         2.80 cm LV PW:         1.00 cm LV IVS:        1.30 cm LVOT diam:     1.90 cm LVOT Area:     2.84 cm   SHUNTS Systemic Diam: 1.90 cm Vina Gull MD Electronically signed by Vina Gull MD Signature Date/Time: 01/28/2024/5:01:20 PM    Final    CT CHEST WO CONTRAST Result Date: 01/28/2024 CLINICAL DATA:  Pulmonary nodules EXAM: CT CHEST WITHOUT CONTRAST TECHNIQUE: Multidetector CT imaging of the chest was performed following the standard protocol  without IV contrast. RADIATION DOSE REDUCTION: This exam was performed according to the departmental dose-optimization program which includes automated exposure control, adjustment of the mA and/or kV according to patient size and/or use of iterative reconstruction technique. COMPARISON:  CT chest same day FINDINGS: Cardiovascular: No significant vascular findings. Normal heart size. No pericardial effusion. Mediastinum/Nodes: No axillary or supraclavicular adenopathy. No mediastinal or hilar adenopathy. No pericardial fluid. Esophagus normal. Lungs/Pleura: Mild biapical pleuroparenchymal thickening. Mild  thickening along the RIGHT fissures. No suspicious nodularity. Upper Abdomen: Limited view of the liver, kidneys, pancreas are unremarkable. Normal adrenal glands. Musculoskeletal: No aggressive osseous lesion. IMPRESSION: 1. No suspicious pulmonary nodularity. 2. Mild pleuroparenchymal thickening at the apices and along the RIGHT fissures. Electronically Signed   By: Jackquline Boxer M.D.   On: 01/28/2024 14:36   US  RENAL Result Date: 01/28/2024 EXAM: US  Retroperitoneum Complete, Renal. CLINICAL HISTORY: 409830 AKI (acute kidney injury) (HCC) 409830. AKI (acute kidney injury) (HCC). TECHNIQUE: Real-time ultrasound of the retroperitoneum (complete) with image documentation. COMPARISON: 10/28/2022 FINDINGS: RIGHT KIDNEY: The right kidney measures 10.7 x 4.9 x 4.8 cm. The volume is 132 cc. On the previous exam, the right kidney had a volume of 178.2 cc. No hydronephrosis, renal stone or mass visualized. LEFT KIDNEY: The left kidney measures 7.4 x 4.8 x 3.6 cm. The volume is equal to 67.2 cc. On the previous exam, the left kidney had a volume of 96.5 cc. There is mild increased parenchymal echogenicity within the left kidney. No hydronephrosis, renal stone or mass visualized. BLADDER: Unremarkable as visualized. IMPRESSION: 1. No hydronephrosis or renal stone. 2. Decreased volume of both kidneys compared to the  prior study, with mild increased parenchymal echogenicity in the left kidney. 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 Electronically signed by: Waddell Calk MD 01/28/2024 07:36 AM EDT RP Workstation: [id number]   CT HEAD WO CONTRAST ( ) Result Date: 01/28/2024 CLINICAL DATA:  78 year old female with altered mental status. On Eliquis. Evidence of GI or GU bleeding. EXAM: CT HEAD WITHOUT CONTRAST TECHNIQUE: Contiguous axial images were obtained from the base of the skull through the vertex without intravenous contrast. RADIATION DOSE REDUCTION: This exam was performed according to the departmental dose-optimization program which includes automated exposure control, adjustment of the mA and/or kV according to patient size and/or use of iterative reconstruction technique. COMPARISON:  Head CT 10/27/2022. FINDINGS: Brain: Stable cerebral volume. No midline shift, ventriculomegaly, mass effect, evidence of mass lesion, intracranial hemorrhage or evidence of cortically based acute infarction. Chronic periventricular white matter hypodensity, moderate for age is stable from last year along with gray-white differentiation. Vascular: No suspicious intracranial vascular hyperdensity. Calcified atherosclerosis at the skull base. Skull: Stable and intact. Sinuses/Orbits: New right middle ear and mastoid opacification from last year. Other visualized paranasal sinuses and mastoids are stable and well aerated. Other: Stable orbit and scalp soft tissues. IMPRESSION: 1. New right middle ear and mastoid opacification from last year. Consider otitis media with mastoid effusion. 2. No acute intracranial abnormality. Stable non contrast CT appearance of chronic white matter disease. Electronically Signed   By: VEAR Hurst M.D.   On: 01/28/2024 04:06   CT CHEST ABDOMEN PELVIS WO CONTRAST Result Date: 01/28/2024 CLINICAL DATA:  Chest and abdominal pain EXAM: CT CHEST, ABDOMEN AND PELVIS WITHOUT CONTRAST  TECHNIQUE: Multidetector CT imaging of the chest, abdomen and pelvis was performed following the standard protocol without IV contrast. RADIATION DOSE REDUCTION: This exam was performed according to the departmental dose-optimization program which includes automated exposure control, adjustment of the mA and/or kV according to patient size and/or use of iterative reconstruction technique. COMPARISON:  Chest x-ray from the previous day. FINDINGS: CT CHEST FINDINGS Cardiovascular: Somewhat limited due to lack of IV contrast. Atherosclerotic calcification of the aorta is noted without aneurysmal dilatation. No cardiac enlargement is seen. No abnormality in the right perihilar region is noted to correspond with that seen on prior chest x-ray. This was felt to have been related  to patient rotation to the right. Mediastinum/Nodes: Thoracic inlet is within normal limits. No hilar or mediastinal adenopathy is noted. The esophagus as visualized is within normal limits. Lungs/Pleura: Lungs are well aerated bilaterally. No focal infiltrate or effusion is seen. No parenchymal nodules are noted. Musculoskeletal: Degenerative changes of the thoracic spine are seen. No acute rib abnormality is noted. CT ABDOMEN PELVIS FINDINGS Hepatobiliary: No focal liver abnormality is seen. Status post cholecystectomy. No biliary dilatation. Pancreas: Unremarkable. No pancreatic ductal dilatation or surrounding inflammatory changes. Spleen: Normal in size without focal abnormality. Adrenals/Urinary Tract: Adrenal glands are within normal limits. Kidneys are well visualized bilaterally. No renal calculi or obstructive changes are seen. Renal arterial calcifications are noted. The bladder is decompressed. Stomach/Bowel: Fecal material is noted scattered throughout the colon. Minimal diverticular changes seen. No diverticulitis is noted. The appendix is within normal limits. Small bowel and stomach are unremarkable. Vascular/Lymphatic: Aortic  atherosclerosis. No enlarged abdominal or pelvic lymph nodes. Reproductive: Status post hysterectomy. Simple appearing right adnexal cyst measuring 5.9 cm. Other: No abdominal wall hernia or abnormality. No abdominopelvic ascites. Musculoskeletal: Degenerative change of the lumbar spine is noted. No acute bony abnormality is seen. IMPRESSION: CT of the chest: No acute abnormality to correspond with that seen on recent chest x-ray. CT of the abdomen and pelvis: Diverticulosis without diverticulitis. Right adnexal simple-appearing cyst measuring 5.9 cm. Recommend follow-up pelvic ultrasound in 6-12 months. Reference: JACR 2020 Feb;17(2):248-254 Electronically Signed   By: Oneil Devonshire M.D.   On: 01/28/2024 01:05   DG Chest Port 1 View Result Date: 01/27/2024 CLINICAL DATA:  Weakness EXAM: PORTABLE CHEST 1 VIEW COMPARISON:  None Available. FINDINGS: Low lung volumes due to in adequate inspiratory force. Fullness of the right suprahilar paramediastinal region may represent an artifact from vascular structures versus a lesion, new to prior. The heart size and mediastinal contours are within normal limits. Both lungs are otherwise no pleural effusion or pneumothorax. Clear. The visualized skeletal structures are unremarkable. IMPRESSION: Fullness of right suprahilar may represent a vascular component versus a true pulmonary/mediastinal lesion. Recommend chest CT for further assessment. Electronically Signed   By: Megan  Zare M.D.   On: 01/27/2024 19:30     LOS: 1 day   Donalda Applebaum, MD  [facility]    To contact the attending provider between 7A-7P or the covering provider during after hours 7P-7A, please log into the web site [email or web address] and access using universal [place] password for that web site. If you do not have the password, please call the hospital operator.  01/29/2024, 8:43 AM

## 2024-01-29 NOTE — IPAL (Signed)
  Interdisciplinary Goals of Care Family Meeting   Date carried out:: 01/29/2024  Location of Robin meeting: Phone conference  Member's involved: Physician and Family Member or next of kin  Durable Power of Attorney or acting medical decision maker: [name], [name].   Discussion: We discussed goals of care for [name] .  Per [name], her father is in [facility] and is unable to make decisions for [name] at this time. She has no living adult children or parents that can make decisions for her. Per [name], she has advance directives that say she does not want to be resuscitated or be on mechanical ventilation. [name] will email a copy of this to me. We agreed to keep her code status as DNR/DNI at this time.    Code status: Full DNR  Disposition: Continue current acute care   Time spent for Robin meeting: 15 minutes.  [name] 01/29/2024, 2:09 PM

## 2024-01-29 NOTE — Consult Note (Signed)
 WOC Nurse Consult Note: Reason for Consult: wounds  Wound type: 1. Moisture Associated Skin Damage to sacrum/buttocks/posterior thighs   ICD-10 CM Codes for Irritant Dermatitis  L24A2 - Due to fecal, urinary or dual incontinence 2. Intertriginous dermatitis underneath both axillae  L30.4  - Erythema intertrigo. Also used for abrasion of the hand, chafing of the skin, dermatitis due to sweating and friction, friction dermatitis, friction eczema, and genital/thigh intertrigo.  3.  R arm full thickness r/t trauma ? Skin tear  Pressure Injury POA: NA not related to pressure  Measurement:see nursing flowsheet  Wound bed: 1. widespread erythema with scattered partial thickness skin loss to sacrum/buttocks and posterior thighs;  2.  Erythema with scattered partial thickness skin loss B axillae ? Fungal component  3.  R arm skin tear red moist with ecchymosis surrounding  Drainage (amount, consistency, odor) see nursing flowsheet  Periwound: ecchymosis noted to B arms  Dressing procedure/placement/frequency:  Cleanse sacrum/buttocks and upper posterior thighs with soap and water, dry and apply Gerhardt's Butt Cream 3 times daily and prn soiling.  Cleanse underneath axillae with soap and water, dry and sprinkle with floor stock antifungal powder (Microguard white and green label). Cut a piece of Interdry AG and place under axillary folds as follows:  Forensic scientist # [id number] Measure and cut length of InterDry to fit in skin folds that have skin breakdown Tuck InterDry fabric into skin folds in a single layer, allow for 2 inches of overhang from skin edges to allow for wicking to occur May remove to bathe; dry area thoroughly and then tuck into affected areas again Do not apply any creams or ointments when using InterDry DO NOT THROW AWAY FOR 5 DAYS unless soiled with stool DO NOT [facility] product, this will inactivate the silver in the material  New sheet of Interdry should be applied after 5 days of use  if patient continues to have skin breakdown   3.  Cleanse R arm skin tear with NS, apply Vaseline gauze to wound bed daily, cover with Telfa nonstick dressing and secure with Kerlix roll gauze.  SOAK DRESSING IN NS IF ADHERED TO WOUND BED FOR ATRAUMATIC REMOVAL.   POC discussed with bedside nurse. WOC team will not follow. Re-consult if further needs arise.   Thank you,    Powell Bar MSN, RN-BC, [employer]

## 2024-01-29 NOTE — Progress Notes (Addendum)
 eLink Physician-Brief Progress Note Patient Name: [name] [name] DOB: 1945/06/16 MRN: [id number]   Date of Service  01/29/2024  HPI/Events of Note  E link asked to review follow up CXR after Ptx noted earlier.   eICU Interventions  Stable small pneumothorax, no increase in size noted. Will repeat in 6 hours to ensure stability  Patient also with non specific pain. DNR DNI status noted. AST ALT were ok on last check. IV tylenol  x 1      Intervention Category Intermediate Interventions: Pain - evaluation and management  Natoshia Souter G Avaree Gilberti 01/29/2024, 8:26 PM  11:25 pm - moaning in pain and causing CRRT alarms to go off. Will try low dose fentanyl  x 1  2:55 am - I had ordered a follow up CXR to follow the PTX. Stable.

## 2024-01-29 NOTE — Progress Notes (Signed)
 [facility] Progress Note   Assessment/ Plan:    AKI on CKD 3b - baseline Cr 1.3, no up to 3.3 with BUN 134 - not making enough urine, needs dialytic intervention- CRRT given pressures/ mentation etc - appreciate hospitalist and PCCM - will write orders for CRRT - point of contact is stepdaughter Noela- she has been notified by hospitalist- she knows that pt has advanced directive but it's at the house 2-3 hrs away- Jemila says to go ahead with ICU transfer etc for now    2.  Metabolic acidosis:  - bicarb gtt, CRRT will correct  3 Acute encephalopathy:  - suspect uremia/ sepsis  4 possible GIB  - Hgb stable  - monitor   - GI consult per discretion of primary  - off Eliquis  5.  Dispo: transfer to ICU  Subjective:    Not any better.  Minimal UOP, on Bipap, mentation bad.  Pressures soft now.  She needs dialytic intervention, likely not able to tolerate HD- will need CRRT.  D/w hospitalist- appreciate.  CCM consulted yesterday, will need to get them back on board.     Objective:   BP (!) 109/49   Pulse 83   Temp 98.7 F (37.1 C) (Oral)   Resp 17   Ht 5' 6 (1.676 m)   Wt 131.5 kg   SpO2 100%   BMI 46.81 kg/m   Intake/Output Summary (Last 24 hours) at 01/29/2024 1047 Last data filed at 01/29/2024 0700 Gross per 24 hour  Intake --  Output 250 ml  Net -250 ml   Weight change:   Physical Exam: Gen:ill-appearing, on BiPaP CVS: RRR distant heart sounds Resp: clear Abd: large, + pannus Ext: no LE edema Skin: extensive bruising on Upper extremities, appears   Imaging: VAS US  LOWER EXTREMITY VENOUS (DVT) Result Date: 01/28/2024  Lower Venous DVT Study Patient Name:  [name]  Date of Exam:   01/28/2024 Medical Rec #: [id number]                      Accession #:    [id number] Date of Birth: 1945-11-22                       Patient Gender: F Patient Age:   78 years Exam Location:  [facility] Procedure:      VAS US  LOWER EXTREMITY  VENOUS (DVT) Referring Phys: REDIA CLEAVER --------------------------------------------------------------------------------  Indications: Edema.  Anticoagulation: Eliquis. Limitations: Body habitus, poor ultrasound/tissue interface and apneic breathing, small vessels. Comparison Study: Previous exam (reflux) on 03/06/2020 was negative for DVT. Performing Technologist: Ezzie Potters RVT, RDMS  Examination Guidelines: A complete evaluation includes B-mode imaging, spectral Doppler, color Doppler, and power Doppler as needed of all accessible portions of each vessel. Bilateral testing is considered an integral part of a complete examination. Limited examinations for reoccurring indications may be performed as noted. The reflux portion of the exam is performed with the patient in reverse Trendelenburg.  +--------+---------------+---------+-----------+----------+--------------------+ RIGHT   CompressibilityPhasicitySpontaneityPropertiesThrombus Aging       +--------+---------------+---------+-----------+----------+--------------------+ CFV     Full           Yes      Yes                                       +--------+---------------+---------+-----------+----------+--------------------+ SFJ  Full                                                              +--------+---------------+---------+-----------+----------+--------------------+ FV Prox Full           Yes      Yes                                       +--------+---------------+---------+-----------+----------+--------------------+ FV Mid  Full           Yes      Yes                                       +--------+---------------+---------+-----------+----------+--------------------+ FV                     Yes      Yes                  patent by            Distal                                               color/doppler        +--------+---------------+---------+-----------+----------+--------------------+ PFV      Full                                                              +--------+---------------+---------+-----------+----------+--------------------+ POP     Full           Yes      Yes                                       +--------+---------------+---------+-----------+----------+--------------------+ PTV                                                  not visualized       +--------+---------------+---------+-----------+----------+--------------------+ PERO                                                 not visualized       +--------+---------------+---------+-----------+----------+--------------------+   +---------+---------------+---------+-----------+----------+-------------------+ LEFT     CompressibilityPhasicitySpontaneityPropertiesThrombus Aging      +---------+---------------+---------+-----------+----------+-------------------+ CFV      Full           Yes      Yes                                      +---------+---------------+---------+-----------+----------+-------------------+  SFJ      Full                                                             +---------+---------------+---------+-----------+----------+-------------------+ FV Prox  Full           Yes      Yes                                      +---------+---------------+---------+-----------+----------+-------------------+ FV Mid   Full           Yes      Yes                  Not well visualized +---------+---------------+---------+-----------+----------+-------------------+ FV DistalFull           Yes      Yes                                      +---------+---------------+---------+-----------+----------+-------------------+ PFV      Full                                                             +---------+---------------+---------+-----------+----------+-------------------+ POP      Full           Yes      Yes                                       +---------+---------------+---------+-----------+----------+-------------------+ PTV                                                   not visualized      +---------+---------------+---------+-----------+----------+-------------------+ PERO                                                  not visualized      +---------+---------------+---------+-----------+----------+-------------------+     Summary: BILATERAL: - No evidence of deep vein thrombosis seen in the lower extremities, bilaterally (in areas visualized). -No evidence of popliteal cyst, bilaterally.   *See table(s) above for measurements and observations. Electronically signed by Penne Colorado MD on 01/28/2024 at 6:54:16 PM.    Final    ECHOCARDIOGRAM COMPLETE Result Date: 01/28/2024    ECHOCARDIOGRAM REPORT   Patient Name:   [name] Date of Exam: 01/28/2024 Medical Rec #:  [id number]                     Height:       66.0 in Accession #:    [id number]  Weight:       290.0 lb Date of Birth:  1945-06-24                      BSA:          2.342 m Patient Age:    78 years                      BP:           122/59 mmHg Patient Gender: F                             HR:           78 bpm. Exam Location:  Inpatient Procedure: 2D Echo, Cardiac Doppler and Color Doppler (Both Spectral and Color            Flow Doppler were utilized during procedure). Indications:    Elevated Troponin  History:        Patient has no prior history of Echocardiogram examinations.                 CHF; Risk Factors:Hypertension and Sleep Apnea.  Sonographer:    Jayson Gaskins Referring Phys: [phone] REDIA SAILOR [facility]  Sonographer Comments: Suboptimal parasternal window, no apical window, no subcostal window and patient is obese. IMPRESSIONS  1. Poor acoustic windows severely limit study Overall LVEF appears normal. Cannot fully evaluate regional wall motion due to limited views.  2. Right ventricular systolic function was not well  visualized. The right ventricular size is normal.  3. The mitral valve was not well visualized. No evidence of mitral valve regurgitation.  4. The aortic valve was not well visualized. Aortic valve regurgitation is not visualized. FINDINGS  Left Ventricle: Poor acoustic windows severely limit study Overall LVEF appears normal. Cannot fully evaluate regional wall motion due to limited views. The left ventricular internal cavity size was normal in size. Eccentric left ventricular hypertrophy. Right Ventricle: The right ventricular size is normal. Right vetricular wall thickness was not assessed. Right ventricular systolic function was not well visualized. Left Atrium: Left atrial size was not well visualized. Right Atrium: Right atrial size was not well visualized. Pericardium: There is no evidence of pericardial effusion. Presence of epicardial fat layer. Mitral Valve: The mitral valve was not well visualized. No evidence of mitral valve regurgitation. Tricuspid Valve: The tricuspid valve is not well visualized. No evidence of tricuspid stenosis. Aortic Valve: The aortic valve was not well visualized. Aortic valve regurgitation is not visualized. Pulmonic Valve: The pulmonic valve was not assessed. Pulmonic valve regurgitation is not visualized. Aorta: The aortic root was not well visualized. IAS/Shunts: The interatrial septum was not assessed.  LEFT VENTRICLE PLAX 2D LVIDd:         4.20 cm LVIDs:         2.80 cm LV PW:         1.00 cm LV IVS:        1.30 cm LVOT diam:     1.90 cm LVOT Area:     2.84 cm   SHUNTS Systemic Diam: 1.90 cm Vina Gull MD Electronically signed by Vina Gull MD Signature Date/Time: 01/28/2024/5:01:20 PM    Final    CT CHEST WO CONTRAST Result Date: 01/28/2024 CLINICAL DATA:  Pulmonary nodules EXAM: CT CHEST WITHOUT CONTRAST TECHNIQUE: Multidetector CT imaging of the chest was performed following the standard protocol without IV contrast.  RADIATION DOSE REDUCTION: This exam was performed  according to the departmental dose-optimization program which includes automated exposure control, adjustment of the mA and/or kV according to patient size and/or use of iterative reconstruction technique. COMPARISON:  CT chest same day FINDINGS: Cardiovascular: No significant vascular findings. Normal heart size. No pericardial effusion. Mediastinum/Nodes: No axillary or supraclavicular adenopathy. No mediastinal or hilar adenopathy. No pericardial fluid. Esophagus normal. Lungs/Pleura: Mild biapical pleuroparenchymal thickening. Mild thickening along the RIGHT fissures. No suspicious nodularity. Upper Abdomen: Limited view of the liver, kidneys, pancreas are unremarkable. Normal adrenal glands. Musculoskeletal: No aggressive osseous lesion. IMPRESSION: 1. No suspicious pulmonary nodularity. 2. Mild pleuroparenchymal thickening at the apices and along the RIGHT fissures. Electronically Signed   By: Jackquline Boxer M.D.   On: 01/28/2024 14:36   US  RENAL Result Date: 01/28/2024 EXAM: US  Retroperitoneum Complete, Renal. CLINICAL HISTORY: 409830 AKI (acute kidney injury) (HCC) 409830. AKI (acute kidney injury) (HCC). TECHNIQUE: Real-time ultrasound of the retroperitoneum (complete) with image documentation. COMPARISON: 10/28/2022 FINDINGS: RIGHT KIDNEY: The right kidney measures 10.7 x 4.9 x 4.8 cm. The volume is 132 cc. On the previous exam, the right kidney had a volume of 178.2 cc. No hydronephrosis, renal stone or mass visualized. LEFT KIDNEY: The left kidney measures 7.4 x 4.8 x 3.6 cm. The volume is equal to 67.2 cc. On the previous exam, the left kidney had a volume of 96.5 cc. There is mild increased parenchymal echogenicity within the left kidney. No hydronephrosis, renal stone or mass visualized. BLADDER: Unremarkable as visualized. IMPRESSION: 1. No hydronephrosis or renal stone. 2. Decreased volume of both kidneys compared to the prior study, with mild increased parenchymal echogenicity in the left  kidney. 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 Electronically signed by: Waddell Calk MD 01/28/2024 07:36 AM EDT RP Workstation: [id number]   CT HEAD WO CONTRAST ( ) Result Date: 01/28/2024 CLINICAL DATA:  78 year old female with altered mental status. On Eliquis. Evidence of GI or GU bleeding. EXAM: CT HEAD WITHOUT CONTRAST TECHNIQUE: Contiguous axial images were obtained from the base of the skull through the vertex without intravenous contrast. RADIATION DOSE REDUCTION: This exam was performed according to the departmental dose-optimization program which includes automated exposure control, adjustment of the mA and/or kV according to patient size and/or use of iterative reconstruction technique. COMPARISON:  Head CT 10/27/2022. FINDINGS: Brain: Stable cerebral volume. No midline shift, ventriculomegaly, mass effect, evidence of mass lesion, intracranial hemorrhage or evidence of cortically based acute infarction. Chronic periventricular white matter hypodensity, moderate for age is stable from last year along with gray-white differentiation. Vascular: No suspicious intracranial vascular hyperdensity. Calcified atherosclerosis at the skull base. Skull: Stable and intact. Sinuses/Orbits: New right middle ear and mastoid opacification from last year. Other visualized paranasal sinuses and mastoids are stable and well aerated. Other: Stable orbit and scalp soft tissues. IMPRESSION: 1. New right middle ear and mastoid opacification from last year. Consider otitis media with mastoid effusion. 2. No acute intracranial abnormality. Stable non contrast CT appearance of chronic white matter disease. Electronically Signed   By: VEAR Hurst M.D.   On: 01/28/2024 04:06   CT CHEST ABDOMEN PELVIS WO CONTRAST Result Date: 01/28/2024 CLINICAL DATA:  Chest and abdominal pain EXAM: CT CHEST, ABDOMEN AND PELVIS WITHOUT CONTRAST TECHNIQUE: Multidetector CT imaging of the chest, abdomen and pelvis was  performed following the standard protocol without IV contrast. RADIATION DOSE REDUCTION: This exam was performed according to the departmental dose-optimization program which includes automated exposure control, adjustment of  the mA and/or kV according to patient size and/or use of iterative reconstruction technique. COMPARISON:  Chest x-ray from the previous day. FINDINGS: CT CHEST FINDINGS Cardiovascular: Somewhat limited due to lack of IV contrast. Atherosclerotic calcification of the aorta is noted without aneurysmal dilatation. No cardiac enlargement is seen. No abnormality in the right perihilar region is noted to correspond with that seen on prior chest x-ray. This was felt to have been related to patient rotation to the right. Mediastinum/Nodes: Thoracic inlet is within normal limits. No hilar or mediastinal adenopathy is noted. The esophagus as visualized is within normal limits. Lungs/Pleura: Lungs are well aerated bilaterally. No focal infiltrate or effusion is seen. No parenchymal nodules are noted. Musculoskeletal: Degenerative changes of the thoracic spine are seen. No acute rib abnormality is noted. CT ABDOMEN PELVIS FINDINGS Hepatobiliary: No focal liver abnormality is seen. Status post cholecystectomy. No biliary dilatation. Pancreas: Unremarkable. No pancreatic ductal dilatation or surrounding inflammatory changes. Spleen: Normal in size without focal abnormality. Adrenals/Urinary Tract: Adrenal glands are within normal limits. Kidneys are well visualized bilaterally. No renal calculi or obstructive changes are seen. Renal arterial calcifications are noted. The bladder is decompressed. Stomach/Bowel: Fecal material is noted scattered throughout the colon. Minimal diverticular changes seen. No diverticulitis is noted. The appendix is within normal limits. Small bowel and stomach are unremarkable. Vascular/Lymphatic: Aortic atherosclerosis. No enlarged abdominal or pelvic lymph nodes. Reproductive:  Status post hysterectomy. Simple appearing right adnexal cyst measuring 5.9 cm. Other: No abdominal wall hernia or abnormality. No abdominopelvic ascites. Musculoskeletal: Degenerative change of the lumbar spine is noted. No acute bony abnormality is seen. IMPRESSION: CT of the chest: No acute abnormality to correspond with that seen on recent chest x-ray. CT of the abdomen and pelvis: Diverticulosis without diverticulitis. Right adnexal simple-appearing cyst measuring 5.9 cm. Recommend follow-up pelvic ultrasound in 6-12 months. Reference: JACR 2020 Feb;17(2):248-254 Electronically Signed   By: Oneil Devonshire M.D.   On: 01/28/2024 01:05   DG Chest Port 1 View Result Date: 01/27/2024 CLINICAL DATA:  Weakness EXAM: PORTABLE CHEST 1 VIEW COMPARISON:  None Available. FINDINGS: Low lung volumes due to in adequate inspiratory force. Fullness of the right suprahilar paramediastinal region may represent an artifact from vascular structures versus a lesion, new to prior. The heart size and mediastinal contours are within normal limits. Both lungs are otherwise no pleural effusion or pneumothorax. Clear. The visualized skeletal structures are unremarkable. IMPRESSION: Fullness of right suprahilar may represent a vascular component versus a true pulmonary/mediastinal lesion. Recommend chest CT for further assessment. Electronically Signed   By: Megan  Zare M.D.   On: 01/27/2024 19:30    Labs: BMET Recent Labs  Lab 01/27/24 1631 01/27/24 1723 01/27/24 1750 01/28/24 0409 01/29/24 0649  NA 132* 131* 132* 134* 136  K 4.9 4.3 4.1 4.0 4.3  CL 97* 95*  --  99 107  CO2  --  21*  --  19* 12*  GLUCOSE 232* 241*  --  240* 130*  BUN 126* 127*  --  129* 134*  CREATININE 3.60* 3.09*  --  3.27* 3.54*  CALCIUM  --  10.3  --  9.8 9.4  PHOS  --   --   --  5.2* 4.8*   CBC Recent Labs  Lab 01/27/24 1631 01/27/24 1723 01/27/24 1750 01/28/24 0409 01/28/24 0410  WBC  --  22.2*  --  21.7*  --   NEUTROABS  --  20.3*   --  19.2*  --   HGB 10.2*  11.6* 11.6* 10.6*  --   HCT 30.0* 33.6* 34.0* 32.1*  --   MCV  --  92.1  --  95.8  --   PLT  --  204  --  194 188    Medications:     Chlorhexidine  Gluconate Cloth  6 each Topical Daily   insulin  aspart  0-6 Units Subcutaneous Q4H   midodrine   10 mg Oral TID WC   pantoprazole  (PROTONIX ) IV  40 mg Intravenous Q12H   thiamine  (VITAMIN B1) injection  100 mg Intravenous Daily   Almarie Bonine MD 01/29/2024, 10:47 AM

## 2024-01-30 ENCOUNTER — Inpatient Hospital Stay (HOSPITAL_COMMUNITY)

## 2024-01-30 DIAGNOSIS — N1832 Chronic kidney disease, stage 3b: Secondary | ICD-10-CM | POA: Diagnosis not present

## 2024-01-30 DIAGNOSIS — N179 Acute kidney failure, unspecified: Secondary | ICD-10-CM | POA: Diagnosis not present

## 2024-01-30 DIAGNOSIS — G9340 Encephalopathy, unspecified: Secondary | ICD-10-CM | POA: Diagnosis not present

## 2024-01-30 DIAGNOSIS — J9611 Chronic respiratory failure with hypoxia: Secondary | ICD-10-CM | POA: Diagnosis not present

## 2024-01-30 LAB — MAGNESIUM: Magnesium: 2 mg/dL (ref 1.7–2.4)

## 2024-01-30 LAB — CBC
HCT: 24.4 % — ABNORMAL LOW (ref 36.0–46.0)
Hemoglobin: 8.4 g/dL — ABNORMAL LOW (ref 12.0–15.0)
MCH: 31.6 pg (ref 26.0–34.0)
MCHC: 34.4 g/dL (ref 30.0–36.0)
MCV: 91.7 fL (ref 80.0–100.0)
Platelets: 97 K/uL — ABNORMAL LOW (ref 150–400)
RBC: 2.66 MIL/uL — ABNORMAL LOW (ref 3.87–5.11)
RDW: 13.2 % (ref 11.5–15.5)
WBC: 20.5 K/uL — ABNORMAL HIGH (ref 4.0–10.5)
nRBC: 0 % (ref 0.0–0.2)

## 2024-01-30 LAB — RENAL FUNCTION PANEL
Albumin: 2 g/dL — ABNORMAL LOW (ref 3.5–5.0)
Albumin: 1.9 g/dL — ABNORMAL LOW (ref 3.5–5.0)
Anion gap: 11 (ref 5–15)
Anion gap: 11 (ref 5–15)
BUN: 34 mg/dL — ABNORMAL HIGH (ref 8–23)
BUN: 11 mg/dL (ref 8–23)
CO2: 23 mmol/L (ref 22–32)
CO2: 25 mmol/L (ref 22–32)
Calcium: 8 mg/dL — ABNORMAL LOW (ref 8.9–10.3)
Calcium: 7.6 mg/dL — ABNORMAL LOW (ref 8.9–10.3)
Chloride: 101 mmol/L (ref 98–111)
Chloride: 100 mmol/L (ref 98–111)
Creatinine, Ser: 1.32 mg/dL — ABNORMAL HIGH (ref 0.44–1.00)
Creatinine, Ser: 0.81 mg/dL (ref 0.44–1.00)
GFR, Estimated: 41 mL/min — ABNORMAL LOW (ref >60)
GFR, Estimated: >60 mL/min (ref >60)
Glucose, Bld: 174 mg/dL — ABNORMAL HIGH (ref 70–99)
Glucose, Bld: 166 mg/dL — ABNORMAL HIGH (ref 70–99)
Phosphorus: 1 mg/dL — CL (ref 2.5–4.6)
Phosphorus: 3.4 mg/dL (ref 2.5–4.6)
Potassium: 3.3 mmol/L — ABNORMAL LOW (ref 3.5–5.1)
Potassium: 4.4 mmol/L (ref 3.5–5.1)
Sodium: 135 mmol/L (ref 135–145)
Sodium: 136 mmol/L (ref 135–145)

## 2024-01-30 LAB — GLUCOSE, CAPILLARY
Glucose-Capillary: 126 mg/dL — ABNORMAL HIGH (ref 70–99)
Glucose-Capillary: 151 mg/dL — ABNORMAL HIGH (ref 70–99)
Glucose-Capillary: 170 mg/dL — ABNORMAL HIGH (ref 70–99)
Glucose-Capillary: 178 mg/dL — ABNORMAL HIGH (ref 70–99)
Glucose-Capillary: 183 mg/dL — ABNORMAL HIGH (ref 70–99)
Glucose-Capillary: 239 mg/dL — ABNORMAL HIGH (ref 70–99)

## 2024-01-30 LAB — C4 COMPLEMENT: Complement C4, Body Fluid: 14 mg/dL (ref 12–38)

## 2024-01-30 LAB — C3 COMPLEMENT: C3 Complement: 88 mg/dL (ref 82–167)

## 2024-01-30 MED ORDER — SODIUM CHLORIDE 0.9 % IV SOLN
2.0000 g | INTRAVENOUS | Status: DC
  Administered 2024-01-30 – 2024-01-31 (×2): 2 g via INTRAVENOUS
  Filled 2024-01-30 (×2): qty 20

## 2024-01-30 MED ORDER — FENTANYL CITRATE PF 50 MCG/ML IJ SOSY: 12.5000 ug | PREFILLED_SYRINGE | INTRAMUSCULAR | Status: DC | PRN

## 2024-01-30 MED ORDER — POTASSIUM PHOSPHATES 15 MMOLE/5ML IV SOLN
45.0000 mmol | Freq: Once | INTRAVENOUS | Status: AC
  Administered 2024-01-30: 45 mmol via INTRAVENOUS
  Filled 2024-01-30: qty 15

## 2024-01-30 MED ORDER — HEPARIN SODIUM (PORCINE) 5000 UNIT/ML IJ SOLN
5000.0000 [IU] | Freq: Three times a day (TID) | INTRAMUSCULAR | Status: DC
  Administered 2024-01-30 – 2024-02-01 (×6): 5000 [IU] via SUBCUTANEOUS
  Filled 2024-01-30 (×7): qty 1

## 2024-01-30 MED ORDER — FENTANYL CITRATE PF 50 MCG/ML IJ SOSY
25.0000 ug | PREFILLED_SYRINGE | INTRAMUSCULAR | Status: DC | PRN
  Administered 2024-01-30 – 2024-02-01 (×7): 25 ug via INTRAVENOUS
  Filled 2024-01-30 (×7): qty 1

## 2024-01-30 NOTE — Progress Notes (Signed)
 Initial Nutrition Assessment  DOCUMENTATION CODES:   Morbid obesity  INTERVENTION:  - NPO, SLP eval pending improvement in mental status.   - If unable to advance diet and within GOC, would recommend Cortrak placement and tube feeds.  - GOC discussions ongoing at this time. Will monitor for further decisions.  NUTRITION DIAGNOSIS:   Inadequate oral intake related to inability to eat as evidenced by NPO status.  GOAL:   Patient will meet greater than or equal to 90% of their needs  MONITOR:   Diet advancement, Labs  REASON FOR ASSESSMENT:   Consult Assessment of nutrition requirement/status  ASSESSMENT:   78 y.o. female with PMH of hypothyroidism, chronic respiratory failure,  chronic diastolic CHF, DVT on Eliquis, HTN, GERD, dementia who was recently admitted for E. coli bacteremia sepsis in the setting of COVID infection was discharged on January 17, 2024. She presented from rehab on 8/16 for bleeding from rectum and foley catheter. Admitted for renal renal failure requiring CRRT and encephalopathy.    8/16 Admit; CRRT initiated   RD working remotely. Patient remains encephalopathic at this time.   Per chart review, weight has trended up since October.   Patient has been NPO since admission. Unable to complete SLP eval today as mentation still not appropriate for PO trials.  GOC discussions ongoing. Patient is DNR/DNI. Nephrology recommending to stop CRRT tomorrow and re-address GOC.  Pending GOC, could consider cortrak placement for tube feeds.    Medications reviewed and include: 100mg  thiamine  daily  Labs reviewed:  K+ 3.3 Creatinine 1.32 Phosphorus 1.0 HA1C 6.4  Blood Glucose 128-239 x24 hours  NUTRITION - FOCUSED PHYSICAL EXAM:  RD working remotely  Diet Order:   Diet Order             Diet NPO time specified  Diet effective now                   EDUCATION NEEDS:  Not appropriate for education at this time  Skin:  Skin Assessment: Skin  Integrity Issues: Skin Integrity Issues:: Stage II Stage II: Sacrum  Last BM:  8/16  Height:  Ht Readings from Last 1 Encounters:  01/27/24 5' 6 (1.676 m)   Weight:  Wt Readings from Last 1 Encounters:  01/30/24 133.5 kg   Ideal Body Weight:  59.09 kg  BMI:  Body mass index is 47.5 kg/m.  Estimated Nutritional Needs:  Kcal:  2150-2300 kcals Protein:  120-150 grams Fluid:  >/= 2.2L    Trude Ned RD, LDN Contact via Secure Chat.

## 2024-01-30 NOTE — Progress Notes (Signed)
 EEG complete - results pending

## 2024-01-30 NOTE — Progress Notes (Signed)
 PT Cancellation Note  Patient Details Name: [name] [name] MRN: [id number] DOB: Feb 07, 1946   Cancelled Treatment:    Reason Eval/Treat Not Completed: Patient not medically ready. Since PT order written on 8/16 pt has been transferred to ICU and started on CRRT.    Rodgers ORN [facility] 01/30/2024, 8:44 AM Rodgers Opal PT Acute [employer] [phone]

## 2024-01-30 NOTE — Progress Notes (Signed)
 SLP Cancellation Note  Patient Details Name: [name] [name] MRN: [id number] DOB: 02-Jul-1945   Cancelled treatment:       Reason Eval/Treat Not Completed: Medical issues which prohibited therapy (Mentation inappropriate for po trials today per RN. Will f/u next date.)  Rea Pass MA, CCC-SLP  Davi Rotan Meryl 01/30/2024, 8:57 AM

## 2024-01-30 NOTE — Progress Notes (Addendum)
 NAME:  [name], MRN:  [id number], DOB:  Dec 24, 1945, LOS: 2 ADMISSION DATE:  01/27/2024, CONSULTATION DATE:  01/30/2024 REFERRING MD:  MELODIE, CHIEF COMPLAINT:  uremic encephalopathy   History of Present Illness:  [name] is a 78 y/o F with a PMH significant for chronic diastolic heart failure, DVT on Eliquis, suspected OHS, morbid obesity, and chronic hypoxic respiratory failure on 3 L O2 by [facility] who presented to [facility] for hematuria and rectal bleeding with hospital course c/b acute renal failure, uremic encephalopathy, and hypotension requiring CRRT and ICU admission.   Pertinent  Medical History  As above  Significant Hospital Events: Including procedures, antibiotic start and stop dates in addition to other pertinent events   Received temp dialysis line After which had an iatrogenic PTX CRRT started  Interim History / Subjective:  Patient's mental status prohibits ability to obtain subjective data.  Objective    Blood pressure 136/67, pulse (!) 101, temperature 100 F (37.8 C), temperature source Axillary, resp. rate 16, height 5' 6 (1.676 m), weight 133.5 kg, SpO2 100%. CVP:  [2 mmHg-8 mmHg] 2 mmHg  FiO2 (%):  [30 %-100 %] 80 %   Intake/Output Summary (Last 24 hours) at 01/30/2024 0956 Last data filed at 01/30/2024 0900 Gross per 24 hour  Intake 3849.88 ml  Output 2799.2 ml  Net 1050.68 ml   Filed Weights   01/27/24 1719 01/29/24 1504 01/30/24 0500  Weight: 131.5 kg 127.2 kg 133.5 kg    Examination: General: morbidly obese, appears ill, lethargic HENT: anicteric sclera, well injected conjunctivae, PERRL, dry oral mucosa Lungs: slightly rhonchorous Cardiovascular: Distant heart sounds Abdomen: soft, slightly tender to palpation throughout the upper and lower abdomen Extremities: 1+ pitting edema upper and lower extremities, bruising bilaterally in upper extremities Neuro: GCS 9, moans intermittently, does not follow commands GU:  Deferred.  Resolved problem list   Assessment and Plan   Cardiovascular:   #Borderline Hypotension: - Can start levophed  if needed to meet MAP goal > 65 mmHg - Continue midodrine  10 mg TID     Pulmonary:   #Acute on Chronic Hypoxic Respiratory Failure: #Iatrogenic Pneumothorax: due to difficult central line placement - Remains on HFNC - Serial CXRs reviewed and pneumothorax remains small, no needle for tube thoracostomy   ID:   #Concern for UTI: blood cultures NGTD, UA +, Urine culture with yeast only. - Continue Ceftriaxone  (end date: 02/02/2024)   GI:   #Concern for UGIB:  - PPI BID - Serial CBCs   #Nutrition: NPO given poor mental status     GU/Renal:   #AKI on CKD stage 3b: - Renally adjust medications - Avoid Nephrotoxins - Strict I/Os - Foley catheter - Appreciate nephrology consult - Continue CRRT for now, may consider iHD given normotension   #High anion gap acidosis: Likely due to renal failure - D/C bicarb drip   #Uremic encephalopathy: - Dialysis as above   #Hypophosphatemia: - Replete phos     Heme/Onc:   #Hx of DVT: heparin  subQ TID     Endocrine:   #DM: - Very sensitive SSI   Neuro:   #Acute Metabolic Encephalopathy: Likely uremic encephalopathy versus sepsis mediated. CT head negative. - Plan as above - If patient's mental status does not improve with correction of uremia, may consider obtaining EEG and MRI Brain to better evaluate etiology in future.   MSK: - PT/OT   Disposition: ICU appropriate for AKI requiring CRRT and uremic encephalopathy.  Best Practice (right click and Reselect  all SmartList Selections daily)   Diet/type: NPO DVT prophylaxis prophylactic heparin   Pressure ulcer(s): N/A GI prophylaxis: PPI Lines: Dialysis Catheter Foley:  Yes, and it is still needed Code Status:  DNR Last date of multidisciplinary goals of care discussion [N/A]  Labs   CBC: Recent Labs  Lab 01/27/24 1723 01/27/24 1750  01/28/24 0409 01/28/24 0410 01/29/24 1501 01/29/24 1617 01/29/24 2309 01/30/24 0410  WBC 22.2*  --  21.7*  --   --  19.9* 19.2* 20.5*  NEUTROABS 20.3*  --  19.2*  --   --  17.3* 16.2*  --   HGB 11.6*   < > 10.6*  --  7.8* 8.2* 8.2* 8.4*  HCT 33.6*   < > 32.1*  --  23.0* 24.8* 23.4* 24.4*  MCV 92.1  --  95.8  --   --  94.7 91.1 91.7  PLT 204  --  194 188  --  99* 104* 97*   < > = values in this interval not displayed.    Basic Metabolic Panel: Recent Labs  Lab 01/27/24 1723 01/27/24 1750 01/28/24 0409 01/29/24 0649 01/29/24 1501 01/29/24 1617 01/30/24 0410  NA 131*   < > 134* 136 135 136 135  K 4.3   < > 4.0 4.3 3.2* 3.1* 3.3*  CL 95*  --  99 107  --  104 101  CO2 21*  --  19* 12*  --  21* 23  GLUCOSE 241*  --  240* 130*  --  177* 174*  BUN 127*  --  129* 134*  --  108* 34*  CREATININE 3.09*  --  3.27* 3.54*  --  2.87* 1.32*  CALCIUM 10.3  --  9.8 9.4  --  8.4* 8.0*  MG  --   --  2.1  --   --   --  2.0  PHOS  --   --  5.2* 4.8*  --  4.1 1.0*   < > = values in this interval not displayed.   GFR: Estimated Creatinine Clearance: 49.4 mL/min (A) (by C-G formula based on SCr of 1.32 mg/dL (H)). Recent Labs  Lab 01/27/24 1631 01/27/24 1723 01/27/24 1845 01/28/24 0409 01/29/24 1617 01/29/24 2309 01/30/24 0410  WBC  --    < >  --  21.7* 19.9* 19.2* 20.5*  LATICACIDVEN 1.7  --  1.3  --   --   --   --    < > = values in this interval not displayed.    Liver Function Tests: Recent Labs  Lab 01/27/24 1723 01/28/24 0409 01/29/24 0649 01/29/24 1617 01/30/24 0410  AST 26 21  --   --   --   ALT 17 15  --   --   --   ALKPHOS 43 42  --   --   --   BILITOT 0.9 1.1  --   --   --   PROT 6.3* 5.8*  --   --   --   ALBUMIN  3.3* 3.0* 2.3* 2.0* 2.0*   No results for input(s): LIPASE, AMYLASE in the last 168 hours. Recent Labs  Lab 01/27/24 2343  AMMONIA 30    ABG    Component Value Date/Time   PHART 7.470 (H) 01/29/2024 1501   PCO2ART 25.6 (L) 01/29/2024 1501    PO2ART 72 (L) 01/29/2024 1501   HCO3 18.7 (L) 01/29/2024 1501   TCO2 19 (L) 01/29/2024 1501   ACIDBASEDEF 4.0 (H) 01/29/2024 1501   O2SAT 96 01/29/2024  1501     Coagulation Profile: Recent Labs  Lab 01/27/24 1723 01/28/24 0410  INR 1.9* 1.9*    Cardiac Enzymes: Recent Labs  Lab 01/28/24 0409 01/29/24 0649  CKTOTAL 98 68    HbA1C: Hgb A1c MFr Bld  Date/Time Value Ref Range Status  01/28/2024 04:09 AM 6.4 (H) 4.8 - 5.6 % Final    Comment:    (NOTE) Diagnosis of Diabetes The following HbA1c ranges recommended by the American Diabetes Association (ADA) may be used as an aid in the diagnosis of diabetes mellitus.  Hemoglobin             Suggested A1C NGSP%              Diagnosis  <5.7                   Non Diabetic  5.7-6.4                Pre-Diabetic  >6.4                   Diabetic  <7.0                   Glycemic control for                       adults with diabetes.      CBG: Recent Labs  Lab 01/29/24 1601 01/29/24 1916 01/29/24 2304 01/30/24 0305 01/30/24 0749  GLUCAP 165* 176* 160* 170* 178*    Review of Systems:   Not obtained.  Past Medical History:  She,  has a past medical history of Benign positional vertigo (03/04/2016), Brain injury (HCC), CHF (congestive heart failure) (HCC), Climacteric, Depression, DJD (degenerative joint disease), Dyslipidemia, GERD (gastroesophageal reflux disease), Hypertension, Hypokalemia, Hypothyroidism, IBS (irritable bowel syndrome), OAB (overactive bladder), Ovarian cyst (03/25/2015), Rhinitis, Sleep apnea, and Spasm of thoracolumbar muscle.   Surgical History:   Past Surgical History:  Procedure Laterality Date   ABDOMINAL HYSTERECTOMY  07/12/2014   ANKLE RECONSTRUCTION Left    BRACHIOPLASTY     BREAST REDUCTION SURGERY Bilateral 2020   CATARACT EXTRACTION     CHOLECYSTECTOMY     COLONOSCOPY WITH PROPOFOL  N/A 12/07/2016   Procedure: COLONOSCOPY WITH PROPOFOL ;  Surgeon: Viktoria Lamar DASEN, MD;   Location: [facility] ENDOSCOPY;  Service: Endoscopy;  Laterality: N/A;   JOINT REPLACEMENT Bilateral 07/2014, 03/2015   nasal reconstrucion     REDUCTION MAMMAPLASTY Bilateral    SHOULDER OPEN ROTATOR CUFF REPAIR     SOFT TISSUE TUMOR RESECTION     excision tumor soft tissue neck, and soft tissue subcutaneous axilla   TOE SURGERY Right    reconstruction right foot 4th and 5th toe     Social History:   reports that she has never smoked. She has never used smokeless tobacco. She reports current alcohol  use of about 2.0 standard drinks of alcohol  per week. She reports that she does not use drugs.   Family History:  Her family history includes Breast cancer in her paternal aunt; Lung cancer in her father and mother.   Allergies Allergies  Allergen Reactions   Codeine Other (See Comments)    Seizure. Tolerates tramadol    Povidone Iodine Shortness Of Breath    Patient had difficulty breathing during procedure where dura prep was used to clean back.    Hydromorphone Hives, Itching and Other (See Comments)    Severe itching hallucinations   Latex Itching and Other (See  Comments)    blisters   Oxycodone-Acetaminophen  Nausea And Vomiting and Itching   Sulfa Antibiotics Itching and Swelling    Swelling eyes, hands, feet. Was at home and didn't go to the hospital   Bupropion Other (See Comments)    Unknown reaction   Gabapentin Other (See Comments)    tremors   Hydrocodone     Tolerates tramadol    Morphine  And Codeine Itching and Rash   Procaine      Home Medications  Prior to Admission medications   Medication Sig Start Date End Date Taking? Authorizing Provider  acetaminophen  (TYLENOL ) 325 MG tablet Take 650 mg by mouth every 6 (six) hours as needed for mild pain (pain score 1-3).   Yes [provider]  Amantadine  HCl 100 MG tablet Take 100 mg by mouth 2 (two) times daily. 03/21/23  Yes [provider]  amLODipine  (NORVASC ) 5 MG tablet Take 5 mg by mouth daily.   Yes  [provider]  Apixaban (ELIQUIS DVT/PE STARTER PACK PO) Take 5 mg by mouth in the morning and at bedtime.   Yes [provider]  ascorbic acid  (VITAMIN C ) 500 MG tablet Take 500 mg by mouth daily.   Yes [provider]  baclofen  (LIORESAL ) 20 MG tablet Take 20 mg by mouth daily as needed for muscle spasms.   Yes [provider]  bumetanide (BUMEX) 1 MG tablet Take 1 mg by mouth 2 (two) times daily.   Yes [provider]  carvedilol  (COREG ) 25 MG tablet Take 50 mg by mouth 2 (two) times daily with a meal.   Yes [provider]  dexamethasone  (DECADRON ) 4 MG tablet Take 6 mg by mouth daily.   Yes [provider]  donepezil  (ARICEPT ) 10 MG tablet Take 10 mg by mouth daily.   Yes [provider]  ferrous sulfate 325 (65 FE) MG tablet Take 325 mg by mouth daily with breakfast.   Yes [provider]  fluconazole (DIFLUCAN) 100 MG tablet Take 100 mg by mouth daily. For 7 days   Yes [provider]  furosemide  (LASIX ) 20 MG tablet Take 20 mg by mouth every morning.   Yes [provider]  hydrOXYzine (ATARAX) 25 MG tablet Take 25 mg by mouth every 6 (six) hours as needed for itching. 10/18/23  Yes [provider]  ipratropium-albuterol  (DUONEB) 0.5-2.5 (3) MG/3ML SOLN Take 3 mLs by nebulization every 4 (four) hours as needed (for congestion/wheezing).   Yes [provider]  levofloxacin (LEVAQUIN) 750 MG tablet Take 750 mg by mouth daily. For 14 days   Yes [provider]  levothyroxine  (SYNTHROID , LEVOTHROID) 112 MCG tablet Take 112 mcg by mouth daily.   Yes [provider]  losartan  (COZAAR ) 100 MG tablet Take 100 mg by mouth daily.   Yes [provider]  montelukast  (SINGULAIR ) 10 MG tablet Take 10 mg by mouth every evening.   Yes [provider]  nystatin  (MYCOSTATIN ) 100000 UNIT/ML suspension Take 5 mLs by mouth in the morning, at noon, and at bedtime.  For 10 days   Yes [provider]  nystatin  (MYCOSTATIN /NYSTOP ) powder Apply topically 2 (two) times daily. Patient taking differently: Apply 1 Application topically 2 (two) times daily as needed. 07/06/20  Yes Von Bellis, MD  omeprazole (PRILOSEC) 20 MG capsule Take 20 mg by mouth daily.   Yes [provider]  saccharomyces boulardii (FLORASTOR) 250 MG capsule Take 250 mg by mouth daily.   Yes [provider]  spironolactone  (ALDACTONE ) 25 MG tablet Take 25 mg by mouth daily. 10/15/22  Yes [provider]  topiramate  (TOPAMAX ) 25 MG tablet Take 25 mg by mouth daily.   Yes [provider]  traZODone  (DESYREL ) 50 MG tablet Take 50 mg by mouth at bedtime.   Yes [provider]  venlafaxine  XR (EFFEXOR -XR) 75 MG 24 hr capsule Take 75 mg by mouth daily with breakfast. 10/17/22  Yes [provider]     Due to a high probability of clinically significant, life threatening deterioration, the patient required my highest level of preparedness to intervene emergently and I personally spent this critical care time directly and personally managing the patient. This critical care time included obtaining a history; examining the patient; pulse oximetry; ordering and review of studies; arranging urgent treatment with development of a management plan; evaluation of patient's response to treatment; frequent reassessment; and, discussions with other providers.  This critical care time was performed to assess and manage the high probability of imminent, life-threatening deterioration that could result in multi-organ failure. It was exclusive of separately billable procedures and treating other patients and teaching time. Critical Care Time: 32 min  Paula Southerly, MD [facility]

## 2024-01-30 NOTE — Progress Notes (Signed)
 [facility] Progress Note   Assessment/ Plan:    AKI on CKD 3b - baseline Cr 1.3, no up to 3.3 with BUN 134 - startted CRRT 8/16 - no real improvement in  MS after CRRT initiation - recommend stopping CRRT sometime tomorrow and re-addressing GOC--> may be able to transition to IHD but if MS no better and no underlying cause other than what we already have discovered ? If would change outcome  2.  Metabolic acidosis:  - bicarb gtt, CRRT will correct  3 Acute encephalopathy:  - suspect uremia/ sepsis  - not any better after CRRT initiation  - d/w PCCM- will consider neuro workup    4 possible GIB  - Hgb stable  - monitor   - GI consult per discretion of primary  - off Eliquis  5.  Acute hypxic RF:  - on HHFC  5.  Dispo: in ICU  Subjective:    Started CRRT yesterday- difficult line, appreciate PCCM.  On HHFC [place] this AM, MS not any better at all.     Objective:   BP (!) 119/49   Pulse 98   Temp (!) 100.6 F (38.1 C) (Axillary)   Resp 15   Ht 5' 6 (1.676 m)   Wt 133.5 kg   SpO2 100%   BMI 47.50 kg/m   Intake/Output Summary (Last 24 hours) at 01/30/2024 1256 Last data filed at 01/30/2024 1200 Gross per 24 hour  Intake 4401.24 ml  Output 3134.2 ml  Net 1267.04 ml   Weight change:   Physical Exam: Gen:ill-appearing, on HHFNC CVS: RRR distant heart sounds Resp: clear Abd: large, + pannus Ext: no LE edema Skin: extensive bruising on Upper extremities, appears   Imaging: DG CHEST PORT 1 VIEW Result Date: 01/30/2024 CLINICAL DATA:  Pneumothorax. EXAM: PORTABLE CHEST 1 VIEW COMPARISON:  Radiograph earlier today. FINDINGS: Stable small right apical pneumothorax. Right-sided central line remains in place. Low lung volumes persist. Stable heart size and mediastinal contours. Right upper lobe atelectasis is similar. Question of increasing left lung base atelectasis versus soft tissue attenuation. No significant pleural effusion. IMPRESSION: 1. Stable small  right apical pneumothorax. 2. Right upper lobe atelectasis is similar. 3. Question of increasing left lung base atelectasis versus soft tissue attenuation. Electronically Signed   By: Andrea Gasman M.D.   On: 01/30/2024 12:18   DG Chest Port 1 View Result Date: 01/30/2024 CLINICAL DATA:  Hypoxia EXAM: PORTABLE CHEST 1 VIEW COMPARISON:  01/29/2024 FINDINGS: Right dialysis catheter tip in the SVC, unchanged. Right apical pneumothorax again noted, stable. Airspace opacity in the right upper lobe, likely atelectasis. Left lung clear. Heart and mediastinal contours are within normal limits. IMPRESSION: Stable small right apical pneumothorax. Electronically Signed   By: Franky Crease M.D.   On: 01/30/2024 02:52   DG Chest Port 1 View Result Date: 01/29/2024 CLINICAL DATA:  Mo thorax follow-up EXAM: PORTABLE CHEST 1 VIEW COMPARISON:  01/29/2024, chest CT 01/28/2024 FINDINGS: Right-sided central venous catheter tip at the cavoatrial region. Small right apical pneumothorax, demonstrates about 17 mm pleural-parenchymal separation compared with 16 mm previously. No acute airspace disease or pleural effusion. Stable mild cardiomegaly. Mitral annular calcification IMPRESSION: 1. Similar size of small right apical pneumothorax. 2. Mild cardiomegaly Electronically Signed   By: Luke Bun M.D.   On: 01/29/2024 19:51   DG CHEST PORT 1 VIEW Result Date: 01/29/2024 CLINICAL DATA:  Central line placement. EXAM: PORTABLE CHEST 1 VIEW COMPARISON:  A [id number]. FINDINGS: The heart size  and mediastinal contours are within normal limits. There is atherosclerotic calcification aorta. There is a right internal jugular central venous catheter which terminates over the superior vena cava. There is a suspected small right apical pneumothorax of approximately 10%. Airspace disease is noted in the mid right lung, possible atelectasis. The left lung is clear. No effusion is seen. No acute osseous abnormality. IMPRESSION: Interval  placement of right internal jugular central venous catheter with small right apical pneumothorax. Critical Value/emergent results were called by telephone at the time of interpretation on 01/29/2024 at 3:00 pm to provider FAHID ALGHANIM , who verbally acknowledged these results. Electronically Signed   By: Leita Birmingham M.D.   On: 01/29/2024 15:00   ECHOCARDIOGRAM COMPLETE Result Date: 01/28/2024    ECHOCARDIOGRAM REPORT   Patient Name:   [name] Date of Exam: 01/28/2024 Medical Rec #:  [id number]                     Height:       66.0 in Accession #:    [id number]                    Weight:       290.0 lb Date of Birth:  05-Sep-1945                      BSA:          2.342 m Patient Age:    78 years                      BP:           122/59 mmHg Patient Gender: F                             HR:           78 bpm. Exam Location:  Inpatient Procedure: 2D Echo, Cardiac Doppler and Color Doppler (Both Spectral and Color            Flow Doppler were utilized during procedure). Indications:    Elevated Troponin  History:        Patient has no prior history of Echocardiogram examinations.                 CHF; Risk Factors:Hypertension and Sleep Apnea.  Sonographer:    Jayson Gaskins Referring Phys: [phone] REDIA SAILOR [facility]  Sonographer Comments: Suboptimal parasternal window, no apical window, no subcostal window and patient is obese. IMPRESSIONS  1. Poor acoustic windows severely limit study Overall LVEF appears normal. Cannot fully evaluate regional wall motion due to limited views.  2. Right ventricular systolic function was not well visualized. The right ventricular size is normal.  3. The mitral valve was not well visualized. No evidence of mitral valve regurgitation.  4. The aortic valve was not well visualized. Aortic valve regurgitation is not visualized. FINDINGS  Left Ventricle: Poor acoustic windows severely limit study Overall LVEF appears normal. Cannot fully evaluate regional wall motion due  to limited views. The left ventricular internal cavity size was normal in size. Eccentric left ventricular hypertrophy. Right Ventricle: The right ventricular size is normal. Right vetricular wall thickness was not assessed. Right ventricular systolic function was not well visualized. Left Atrium: Left atrial size was not well visualized. Right Atrium: Right atrial size was not well visualized. Pericardium: There is no evidence of pericardial  effusion. Presence of epicardial fat layer. Mitral Valve: The mitral valve was not well visualized. No evidence of mitral valve regurgitation. Tricuspid Valve: The tricuspid valve is not well visualized. No evidence of tricuspid stenosis. Aortic Valve: The aortic valve was not well visualized. Aortic valve regurgitation is not visualized. Pulmonic Valve: The pulmonic valve was not assessed. Pulmonic valve regurgitation is not visualized. Aorta: The aortic root was not well visualized. IAS/Shunts: The interatrial septum was not assessed.  LEFT VENTRICLE PLAX 2D LVIDd:         4.20 cm LVIDs:         2.80 cm LV PW:         1.00 cm LV IVS:        1.30 cm LVOT diam:     1.90 cm LVOT Area:     2.84 cm   SHUNTS Systemic Diam: 1.90 cm Vina Gull MD Electronically signed by Vina Gull MD Signature Date/Time: 01/28/2024/5:01:20 PM    Final    CT CHEST WO CONTRAST Result Date: 01/28/2024 CLINICAL DATA:  Pulmonary nodules EXAM: CT CHEST WITHOUT CONTRAST TECHNIQUE: Multidetector CT imaging of the chest was performed following the standard protocol without IV contrast. RADIATION DOSE REDUCTION: This exam was performed according to the departmental dose-optimization program which includes automated exposure control, adjustment of the mA and/or kV according to patient size and/or use of iterative reconstruction technique. COMPARISON:  CT chest same day FINDINGS: Cardiovascular: No significant vascular findings. Normal heart size. No pericardial effusion. Mediastinum/Nodes: No axillary or  supraclavicular adenopathy. No mediastinal or hilar adenopathy. No pericardial fluid. Esophagus normal. Lungs/Pleura: Mild biapical pleuroparenchymal thickening. Mild thickening along the RIGHT fissures. No suspicious nodularity. Upper Abdomen: Limited view of the liver, kidneys, pancreas are unremarkable. Normal adrenal glands. Musculoskeletal: No aggressive osseous lesion. IMPRESSION: 1. No suspicious pulmonary nodularity. 2. Mild pleuroparenchymal thickening at the apices and along the RIGHT fissures. Electronically Signed   By: Jackquline Boxer M.D.   On: 01/28/2024 14:36    Labs: BMET Recent Labs  Lab 01/27/24 1631 01/27/24 1723 01/27/24 1750 01/28/24 0409 01/29/24 0649 01/29/24 1501 01/29/24 1617 01/30/24 0410  NA 132* 131* 132* 134* 136 135 136 135  K 4.9 4.3 4.1 4.0 4.3 3.2* 3.1* 3.3*  CL 97* 95*  --  99 107  --  104 101  CO2  --  21*  --  19* 12*  --  21* 23  GLUCOSE 232* 241*  --  240* 130*  --  177* 174*  BUN 126* 127*  --  129* 134*  --  108* 34*  CREATININE 3.60* 3.09*  --  3.27* 3.54*  --  2.87* 1.32*  CALCIUM  --  10.3  --  9.8 9.4  --  8.4* 8.0*  PHOS  --   --   --  5.2* 4.8*  --  4.1 1.0*   CBC Recent Labs  Lab 01/27/24 1723 01/27/24 1750 01/28/24 0409 01/28/24 0410 01/29/24 1501 01/29/24 1617 01/29/24 2309 01/30/24 0410  WBC 22.2*  --  21.7*  --   --  19.9* 19.2* 20.5*  NEUTROABS 20.3*  --  19.2*  --   --  17.3* 16.2*  --   HGB 11.6*   < > 10.6*  --  7.8* 8.2* 8.2* 8.4*  HCT 33.6*   < > 32.1*  --  23.0* 24.8* 23.4* 24.4*  MCV 92.1  --  95.8  --   --  94.7 91.1 91.7  PLT 204  --  194  188  --  99* 104* 97*   < > = values in this interval not displayed.    Medications:     Chlorhexidine  Gluconate Cloth  6 each Topical Daily   Gerhardt's butt cream   Topical TID   heparin  injection (subcutaneous)  5,000 Units Subcutaneous Q8H   insulin  aspart  0-6 Units Subcutaneous Q4H   midodrine   10 mg Oral TID WC   mupirocin  ointment   Nasal BID   pantoprazole   (PROTONIX ) IV  40 mg Intravenous Q12H   thiamine  (VITAMIN B1) injection  100 mg Intravenous Daily   Almarie Bonine MD 01/30/2024, 12:56 PM

## 2024-01-30 NOTE — Progress Notes (Signed)
 SLP Cancellation Note  Patient Details Name: [name] [name] MRN: [id number] DOB: Dec 08, 1945   Cancelled treatment:       Reason Eval/Treat Not Completed: Medical issues which prohibited therapy (Mentation inappropriate for po trials today per RN. Will f/u next date.)  Rea Pass MA, CCC-SLP  Kimerly Rowand Meryl 01/30/2024, 8:58 AM

## 2024-01-30 NOTE — Progress Notes (Signed)
 Pharmacy Electrolyte Replacement  Recent Labs:  Recent Labs    01/30/24 0410  K 3.3*  MG 2.0  PHOS 1.0*  CREATININE 1.32*    Low Critical Values (K </= 2.5, Phos </= 1, Mg </= 1) Present: Phos = 1  MD Contacted: Fahid Alghanim   Plan: potassium phosphate  45 mmol IV once. Recheck levels at 1600.   Vermell Mccallum, PharmD CCM Pharmacy Resident

## 2024-01-31 ENCOUNTER — Inpatient Hospital Stay (HOSPITAL_COMMUNITY)

## 2024-01-31 DIAGNOSIS — R4182 Altered mental status, unspecified: Secondary | ICD-10-CM | POA: Diagnosis not present

## 2024-01-31 DIAGNOSIS — R569 Unspecified convulsions: Secondary | ICD-10-CM

## 2024-01-31 LAB — RENAL FUNCTION PANEL
Albumin: 1.8 g/dL — ABNORMAL LOW (ref 3.5–5.0)
Anion gap: 8 (ref 5–15)
BUN: 6 mg/dL — ABNORMAL LOW (ref 8–23)
CO2: 25 mmol/L (ref 22–32)
Calcium: 7.9 mg/dL — ABNORMAL LOW (ref 8.9–10.3)
Chloride: 103 mmol/L (ref 98–111)
Creatinine, Ser: 0.63 mg/dL (ref 0.44–1.00)
GFR, Estimated: >60 mL/min (ref >60)
Glucose, Bld: 131 mg/dL — ABNORMAL HIGH (ref 70–99)
Phosphorus: 1.9 mg/dL — ABNORMAL LOW (ref 2.5–4.6)
Potassium: 4.2 mmol/L (ref 3.5–5.1)
Sodium: 136 mmol/L (ref 135–145)

## 2024-01-31 LAB — ANA W/REFLEX IF POSITIVE: Anti Nuclear Antibody (ANA): POSITIVE — AB

## 2024-01-31 LAB — ENA+DNA/DS+ANTICH+CENTRO+JO...
Anti JO-1: <0.2 AI (ref 0.0–0.9)
Centromere Ab Screen: <0.2 AI (ref 0.0–0.9)
Chromatin Ab SerPl-aCnc: <0.2 AI (ref 0.0–0.9)
ENA SM Ab Ser-aCnc: <0.2 AI (ref 0.0–0.9)
Ribonucleic Protein: 1 AI — ABNORMAL HIGH (ref 0.0–0.9)
SSA (Ro) (ENA) Antibody, IgG: <0.2 AI (ref 0.0–0.9)
SSB (La) (ENA) Antibody, IgG: <0.2 AI (ref 0.0–0.9)
Scleroderma (Scl-70) (ENA) Antibody, IgG: <0.2 AI (ref 0.0–0.9)
ds DNA Ab: <1 [IU]/mL (ref 0–9)

## 2024-01-31 LAB — CBC
HCT: 23.5 % — ABNORMAL LOW (ref 36.0–46.0)
Hemoglobin: 7.8 g/dL — ABNORMAL LOW (ref 12.0–15.0)
MCH: 32 pg (ref 26.0–34.0)
MCHC: 33.2 g/dL (ref 30.0–36.0)
MCV: 96.3 fL (ref 80.0–100.0)
Platelets: 66 K/uL — ABNORMAL LOW (ref 150–400)
RBC: 2.44 MIL/uL — ABNORMAL LOW (ref 3.87–5.11)
RDW: 13.8 % (ref 11.5–15.5)
WBC: 19.9 K/uL — ABNORMAL HIGH (ref 4.0–10.5)
nRBC: 0.1 % (ref 0.0–0.2)

## 2024-01-31 LAB — GLUCOSE, CAPILLARY
Glucose-Capillary: 129 mg/dL — ABNORMAL HIGH (ref 70–99)
Glucose-Capillary: 116 mg/dL — ABNORMAL HIGH (ref 70–99)
Glucose-Capillary: 70 mg/dL (ref 70–99)
Glucose-Capillary: 110 mg/dL — ABNORMAL HIGH (ref 70–99)
Glucose-Capillary: 140 mg/dL — ABNORMAL HIGH (ref 70–99)
Glucose-Capillary: 155 mg/dL — ABNORMAL HIGH (ref 70–99)
Glucose-Capillary: 64 mg/dL — ABNORMAL LOW (ref 70–99)
Glucose-Capillary: 190 mg/dL — ABNORMAL HIGH (ref 70–99)

## 2024-01-31 LAB — MAGNESIUM: Magnesium: 2.1 mg/dL (ref 1.7–2.4)

## 2024-01-31 MED ORDER — MIDODRINE HCL 5 MG PO TABS: 10.0000 mg | ORAL_TABLET | Freq: Three times a day (TID) | ORAL | Status: DC

## 2024-01-31 MED ORDER — DEXTROSE 50 % IV SOLN
INTRAVENOUS | Status: AC
  Filled 2024-01-31: qty 50

## 2024-01-31 MED ORDER — PROSOURCE TF20 ENFIT COMPATIBL EN LIQD
60.0000 mL | Freq: Two times a day (BID) | ENTERAL | Status: DC
  Administered 2024-01-31 (×2): 60 mL
  Filled 2024-01-31 (×3): qty 60

## 2024-01-31 MED ORDER — ALBUMIN HUMAN 25 % IV SOLN
25.0000 g | Freq: Once | INTRAVENOUS | Status: AC
  Administered 2024-01-31: 25 g via INTRAVENOUS

## 2024-01-31 MED ORDER — OSMOLITE 1.5 CAL PO LIQD
1000.0000 mL | ORAL | Status: DC
  Administered 2024-01-31: 1000 mL
  Filled 2024-01-31 (×3): qty 1000

## 2024-01-31 MED ORDER — NOREPINEPHRINE 4 MG/250ML-% IV SOLN
0.0000 ug/min | INTRAVENOUS | Status: DC
  Administered 2024-01-31: 5 ug/min via INTRAVENOUS
  Administered 2024-02-01: 10 ug/min via INTRAVENOUS
  Filled 2024-01-31: qty 250

## 2024-01-31 MED ORDER — CHLORHEXIDINE GLUCONATE CLOTH 2 % EX PADS
6.0000 | MEDICATED_PAD | CUTANEOUS | Status: DC
  Administered 2024-01-31 – 2024-02-05 (×6): 6 via TOPICAL

## 2024-01-31 MED ORDER — ALBUMIN HUMAN 25 % IV SOLN
INTRAVENOUS | Status: AC
  Filled 2024-01-31: qty 50

## 2024-01-31 MED ORDER — NOREPINEPHRINE 4 MG/250ML-% IV SOLN
INTRAVENOUS | Status: AC
  Filled 2024-01-31: qty 250

## 2024-01-31 MED ORDER — JUVEN PO PACK
1.0000 | PACK | Freq: Two times a day (BID) | ORAL | Status: DC
  Administered 2024-01-31: 1
  Filled 2024-01-31 (×2): qty 1

## 2024-01-31 MED ORDER — HEPARIN SODIUM (PORCINE) 1000 UNIT/ML DIALYSIS
1000.0000 [IU] | INTRAMUSCULAR | Status: DC | PRN
  Administered 2024-01-31: 2400 [IU] via INTRAVENOUS_CENTRAL
  Filled 2024-01-31: qty 6
  Filled 2024-01-31: qty 3

## 2024-01-31 MED ORDER — MIDODRINE HCL 5 MG PO TABS
10.0000 mg | ORAL_TABLET | Freq: Three times a day (TID) | ORAL | Status: DC
  Administered 2024-01-31 – 2024-02-01 (×2): 10 mg via ORAL
  Filled 2024-01-31 (×2): qty 2

## 2024-01-31 NOTE — Progress Notes (Signed)
 PT Cancellation Note  Patient Details Name: [name] MRN: [id number] DOB: 1945/12/23   Cancelled Treatment:    Reason Eval/Treat Not Completed: Medical issues which prohibited therapy; per RN pt not able to participate, not getting CRRT but awaiting goals of care specifications.  PT will check again for GOC prior to signing off.   [name] 01/31/2024, 11:07 AM [name], PT Acute Rehabilitation Services [phone] 01/31/2024

## 2024-01-31 NOTE — Procedures (Signed)
 Cortrak  Person Inserting Tube:  Mady Dolly, RD Tube Type:  Cortrak - 43 inches Tube Size:  10 Tube Location:  Left nare Initial Placement:  Stomach Secured by: Bridle Technique Used to Measure Tube Placement:  Marking at nare/corner of mouth Cortrak Secured At:  64 cm   Cortrak Tube Team Note:  Consult received to place a Cortrak feeding tube.   No x-ray is required. RN may begin using tube.   If the tube becomes dislodged please keep the tube and contact the Cortrak team at [email or web address] for replacement.  If after hours and replacement cannot be delayed, place a NG tube and confirm placement with an abdominal x-ray.    Dolly Mady MS, RD, LDN Registered Dietitian Clinical Nutrition RD Inpatient Contact Info in Amion

## 2024-01-31 NOTE — Progress Notes (Signed)
 NAME:  [name], MRN:  [id number], DOB:  1945/12/15, LOS: 3 ADMISSION DATE:  01/27/2024, CONSULTATION DATE:  01/30/2024 REFERRING MD:  MELODIE, CHIEF COMPLAINT:  uremic encephalopathy   History of Present Illness:  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 who presented to [facility] for hematuria and rectal bleeding with hospital course c/b acute renal failure, uremic encephalopathy, and hypotension requiring CRRT and ICU admission.   Pertinent  Medical History  As above  Significant Hospital Events: Including procedures, antibiotic start and stop dates in addition to other pertinent events   Received temp dialysis line After which had an iatrogenic PTX CRRT started  Interim History / Subjective:  Nurse reports dark stools yesterday and this morning. Otherwise no events overnight. Remain on CRRT   Objective    Blood pressure (!) 96/54, pulse 88, temperature 99.5 F (37.5 C), temperature source Axillary, resp. rate 17, height 5' 6 (1.676 m), weight 128.6 kg, SpO2 100%. CVP:  [0 mmHg-2 mmHg] 0 mmHg  FiO2 (%):  [70 %-80 %] 70 %   Intake/Output Summary (Last 24 hours) at 01/31/2024 9287 Last data filed at 01/31/2024 0700 Gross per 24 hour  Intake 1246.33 ml  Output 1530 ml  Net -283.67 ml   Filed Weights   01/29/24 1504 01/30/24 0500 01/31/24 0145  Weight: 127.2 kg 133.5 kg 128.6 kg    Examination: General: morbidly obese, appears ill, lethargic HENT: anicteric sclera, well injected conjunctivae, PERRL, dry oral mucosa Lungs: slightly rhonchorous Cardiovascular: Distant heart sounds Abdomen: soft, slightly tender to palpation throughout the upper and lower abdomen Extremities: 1+ pitting edema upper and lower extremities, bruising bilaterally in upper extremities Neuro: GCS 9, moans intermittently, does not follow  commands   Labs: WBC of 19.9 Hbg 7.8 << 8.4 Plt 66 < 97 Mg normal at 2.1 Electrolytes wnl Hypophosphatemia of 1.9  Albumin  of 1.8  Blood glucose of 129 this morning    Resolved problem list   Assessment and Plan   Cardiovascular:  #Borderline Hypotension: - Can start levophed  if needed to meet MAP goal > 65 mmHg - Continue midodrine  10 mg TID when able    Pulmonary:  #Acute on Chronic Hypoxic Respiratory Failure: #Iatrogenic Pneumothorax: due to difficult central line placement - On HFNC - Wean as tolerated    ID:  #Concern for UTI: UA +, Urine culture with yeast only - Blood Cx no growth till date  - Continue Ceftriaxone  (end date: 02/01/2024)   GI:  #Concern for UGIB - Will trend Hgb  - Transfuse protocol  - Will engage GI if indicated  - PPI BID - Serial CBCs   #Nutrition: NPO given poor mental status - Place Cortrak today and advance tube feeds as tolerated      GU/Renal:  #AKI on CKD stage 3b: #Electrolytes replacement via dialysis  #Uremic encephalopathy - Renally adjust medications - Avoid Nephrotoxins - Strict I/Os - Foley catheter - Appreciate nephrology consult - Continue CRRT for now, may consider iHD given normotension     Heme/Onc:  #Hx of DVT: heparin  subQ TID    Endocrine:  #DM: - Very sensitive SSI   Neuro:  #Acute Metabolic Encephalopathy: Likely uremic encephalopathy versus sepsis mediated. CT head negative. - Plan as above - If patient's mental status does not improve with correction of uremia, may consider obtaining EEG  and MRI Brain to better evaluate etiology in future.   MSK: - PT/OT   Disposition: ICU appropriate for AKI requiring CRRT and uremic encephalopathy.  Best Practice (right click and Reselect all SmartList Selections daily)   Diet/type: NPO DVT prophylaxis prophylactic heparin   Pressure ulcer(s): N/A GI prophylaxis: PPI Lines: Dialysis Catheter Foley:  Yes, and it is still needed Code Status:   DNR Last date of multidisciplinary goals of care discussion [Not yet]  Labs   CBC: Recent Labs  Lab 01/27/24 1723 01/27/24 1750 01/28/24 0409 01/28/24 0410 01/29/24 1501 01/29/24 1617 01/29/24 2309 01/30/24 0410 01/31/24 0253  WBC 22.2*  --  21.7*  --   --  19.9* 19.2* 20.5* 19.9*  NEUTROABS 20.3*  --  19.2*  --   --  17.3* 16.2*  --   --   HGB 11.6*   < > 10.6*  --  7.8* 8.2* 8.2* 8.4* 7.8*  HCT 33.6*   < > 32.1*  --  23.0* 24.8* 23.4* 24.4* 23.5*  MCV 92.1  --  95.8  --   --  94.7 91.1 91.7 96.3  PLT 204  --  194 188  --  99* 104* 97* 66*   < > = values in this interval not displayed.    Basic Metabolic Panel: Recent Labs  Lab 01/28/24 0409 01/29/24 0649 01/29/24 1501 01/29/24 1617 01/30/24 0410 01/30/24 1809 01/31/24 0253  NA 134* 136 135 136 135 136 136  K 4.0 4.3 3.2* 3.1* 3.3* 4.4 4.2  CL 99 107  --  104 101 100 103  CO2 19* 12*  --  21* 23 25 25   GLUCOSE 240* 130*  --  177* 174* 166* 131*  BUN 129* 134*  --  108* 34* 11 6*  CREATININE 3.27* 3.54*  --  2.87* 1.32* 0.81 0.63  CALCIUM 9.8 9.4  --  8.4* 8.0* 7.6* 7.9*  MG 2.1  --   --   --  2.0  --  2.1  PHOS 5.2* 4.8*  --  4.1 1.0* 3.4 1.9*   GFR: Estimated Creatinine Clearance: 79.6 mL/min (by C-G formula based on SCr of 0.63 mg/dL). Recent Labs  Lab 01/27/24 1631 01/27/24 1723 01/27/24 1845 01/28/24 0409 01/29/24 1617 01/29/24 2309 01/30/24 0410 01/31/24 0253  WBC  --    < >  --    < > 19.9* 19.2* 20.5* 19.9*  LATICACIDVEN 1.7  --  1.3  --   --   --   --   --    < > = values in this interval not displayed.    Liver Function Tests: Recent Labs  Lab 01/27/24 1723 01/28/24 0409 01/29/24 0649 01/29/24 1617 01/30/24 0410 01/30/24 1809 01/31/24 0253  AST 26 21  --   --   --   --   --   ALT 17 15  --   --   --   --   --   ALKPHOS 43 42  --   --   --   --   --   BILITOT 0.9 1.1  --   --   --   --   --   PROT 6.3* 5.8*  --   --   --   --   --   ALBUMIN  3.3* 3.0* 2.3* 2.0* 2.0* 1.9* 1.8*    No results for input(s): LIPASE, AMYLASE in the last 168 hours. Recent Labs  Lab 01/27/24 2343  AMMONIA 30    ABG  Component Value Date/Time   PHART 7.470 (H) 01/29/2024 1501   PCO2ART 25.6 (L) 01/29/2024 1501   PO2ART 72 (L) 01/29/2024 1501   HCO3 18.7 (L) 01/29/2024 1501   TCO2 19 (L) 01/29/2024 1501   ACIDBASEDEF 4.0 (H) 01/29/2024 1501   O2SAT 96 01/29/2024 1501     Coagulation Profile: Recent Labs  Lab 01/27/24 1723 01/28/24 0410  INR 1.9* 1.9*    Cardiac Enzymes: Recent Labs  Lab 01/28/24 0409 01/29/24 0649  CKTOTAL 98 68    HbA1C: Hgb A1c MFr Bld  Date/Time Value Ref Range Status  01/28/2024 04:09 AM 6.4 (H) 4.8 - 5.6 % Final    Comment:    (NOTE) Diagnosis of Diabetes The following HbA1c ranges recommended by the American Diabetes Association (ADA) may be used as an aid in the diagnosis of diabetes mellitus.  Hemoglobin             Suggested A1C NGSP%              Diagnosis  <5.7                   Non Diabetic  5.7-6.4                Pre-Diabetic  >6.4                   Diabetic  <7.0                   Glycemic control for                       adults with diabetes.      CBG: Recent Labs  Lab 01/30/24 1158 01/30/24 1634 01/30/24 1957 01/30/24 2341 01/31/24 0246  GLUCAP 239* 183* 151* 126* 129*    Review of Systems:   Not obtained.  Past Medical History:  She,  has a past medical history of Benign positional vertigo (03/04/2016), Brain injury (HCC), CHF (congestive heart failure) (HCC), Climacteric, Depression, DJD (degenerative joint disease), Dyslipidemia, GERD (gastroesophageal reflux disease), Hypertension, Hypokalemia, Hypothyroidism, IBS (irritable bowel syndrome), OAB (overactive bladder), Ovarian cyst (03/25/2015), Rhinitis, Sleep apnea, and Spasm of thoracolumbar muscle.   Surgical History:   Past Surgical History:  Procedure Laterality Date   ABDOMINAL HYSTERECTOMY  07/12/2014   ANKLE RECONSTRUCTION Left     BRACHIOPLASTY     BREAST REDUCTION SURGERY Bilateral 2020   CATARACT EXTRACTION     CHOLECYSTECTOMY     COLONOSCOPY WITH PROPOFOL  N/A 12/07/2016   Procedure: COLONOSCOPY WITH PROPOFOL ;  Surgeon: Viktoria Lamar DASEN, MD;  Location[facility] ENDOSCOPY;  Service: Endoscopy;  Laterality: N/A;   JOINT REPLACEMENT Bilateral 07/2014, 03/2015   nasal reconstrucion     REDUCTION MAMMAPLASTY Bilateral    SHOULDER OPEN ROTATOR CUFF REPAIR     SOFT TISSUE TUMOR RESECTION     excision tumor soft tissue neck, and soft tissue subcutaneous axilla   TOE SURGERY Right    reconstruction right foot 4th and 5th toe     Social History:   reports that she has never smoked. She has never used smokeless tobacco. She reports current alcohol  use of about 2.0 standard drinks of alcohol  per week. She reports that she does not use drugs.   Family History:  Her family history includes Breast cancer in her paternal aunt; Lung cancer in her father and mother.   Allergies Allergies  Allergen Reactions   Codeine Other (See Comments)  Seizure. Tolerates tramadol    Povidone Iodine Shortness Of Breath    Patient had difficulty breathing during procedure where dura prep was used to clean back.    Hydromorphone Hives, Itching and Other (See Comments)    Severe itching hallucinations   Latex Itching and Other (See Comments)    blisters   Oxycodone-Acetaminophen  Nausea And Vomiting and Itching   Sulfa Antibiotics Itching and Swelling    Swelling eyes, hands, feet. Was at home and didn't go to the hospital   Bupropion Other (See Comments)    Unknown reaction   Gabapentin Other (See Comments)    tremors   Hydrocodone     Tolerates tramadol    Morphine  And Codeine Itching and Rash   Procaine      Home Medications  Prior to Admission medications   Medication Sig Start Date End Date Taking? Authorizing Provider  acetaminophen  (TYLENOL ) 325 MG tablet Take 650 mg by mouth every 6 (six) hours as needed for mild pain (pain  score 1-3).   Yes [provider]  Amantadine  HCl 100 MG tablet Take 100 mg by mouth 2 (two) times daily. 03/21/23  Yes [provider]  amLODipine  (NORVASC ) 5 MG tablet Take 5 mg by mouth daily.   Yes [provider]  Apixaban (ELIQUIS DVT/PE STARTER PACK PO) Take 5 mg by mouth in the morning and at bedtime.   Yes [provider]  ascorbic acid  (VITAMIN C ) 500 MG tablet Take 500 mg by mouth daily.   Yes [provider]  baclofen  (LIORESAL ) 20 MG tablet Take 20 mg by mouth daily as needed for muscle spasms.   Yes [provider]  bumetanide (BUMEX) 1 MG tablet Take 1 mg by mouth 2 (two) times daily.   Yes [provider]  carvedilol  (COREG ) 25 MG tablet Take 50 mg by mouth 2 (two) times daily with a meal.   Yes [provider]  dexamethasone  (DECADRON ) 4 MG tablet Take 6 mg by mouth daily.   Yes [provider]  donepezil  (ARICEPT ) 10 MG tablet Take 10 mg by mouth daily.   Yes [provider]  ferrous sulfate 325 (65 FE) MG tablet Take 325 mg by mouth daily with breakfast.   Yes [provider]  fluconazole (DIFLUCAN) 100 MG tablet Take 100 mg by mouth daily. For 7 days   Yes [provider]  furosemide  (LASIX ) 20 MG tablet Take 20 mg by mouth every morning.   Yes [provider]  hydrOXYzine (ATARAX) 25 MG tablet Take 25 mg by mouth every 6 (six) hours as needed for itching. 10/18/23  Yes [provider]  ipratropium-albuterol  (DUONEB) 0.5-2.5 (3) MG/3ML SOLN Take 3 mLs by nebulization every 4 (four) hours as needed (for congestion/wheezing).   Yes [provider]  levofloxacin (LEVAQUIN) 750 MG tablet Take 750 mg by mouth daily. For 14 days   Yes [provider]  levothyroxine  (SYNTHROID , LEVOTHROID) 112 MCG tablet Take 112 mcg by mouth daily.   Yes [provider]  losartan  (COZAAR ) 100 MG tablet Take 100 mg by mouth daily.   Yes [provider]  montelukast  (SINGULAIR ) 10 MG tablet Take 10 mg by mouth every evening.   Yes [provider]  nystatin  (MYCOSTATIN ) 100000 UNIT/ML suspension Take 5 mLs by mouth in the morning, at noon, and at bedtime. For 10 days   Yes [provider]  nystatin  (MYCOSTATIN /NYSTOP ) powder Apply topically 2 (two) times daily. Patient taking differently: Apply  1 Application topically 2 (two) times daily as needed. 07/06/20  Yes Von Bellis, MD  omeprazole (PRILOSEC) 20 MG capsule Take 20 mg by mouth daily.   Yes [provider]  saccharomyces boulardii (FLORASTOR) 250 MG capsule Take 250 mg by mouth daily.   Yes [provider]  spironolactone  (ALDACTONE ) 25 MG tablet Take 25 mg by mouth daily. 10/15/22  Yes [provider]  topiramate  (TOPAMAX ) 25 MG tablet Take 25 mg by mouth daily.   Yes [provider]  traZODone  (DESYREL ) 50 MG tablet Take 50 mg by mouth at bedtime.   Yes [provider]  venlafaxine  XR (EFFEXOR -XR) 75 MG 24 hr capsule Take 75 mg by mouth daily with breakfast. 10/17/22  Yes [provider]     Drue Lisa Grow MD 01/31/2024, 7:14 AM

## 2024-01-31 NOTE — Procedures (Signed)
 Patient Name: KINDRED HEYING [name]  MRN: [id number]  Epilepsy Attending: Arlin MALVA Krebs  Referring Physician/Provider: Catherine Cools, MD  Date: 01/30/2024 Duration: 23.42 mins  Patient history: 78yo F with ams. EEG to evaluate for seizure  Level of alertness: Awake/ lethargic  AEDs during EEG study: None  Technical aspects: This EEG study was done with scalp electrodes positioned according to the 10-20 International system of electrode placement. Electrical activity was reviewed with band pass filter of 1-70Hz , sensitivity of 7 uV/mm, display speed of 53mm/sec with a 60Hz  notched filter applied as appropriate. EEG data were recorded continuously and digitally stored.  Video monitoring was available and reviewed as appropriate.  Description: EEG showed continuous generalized 5 to 9 Hz theta-alpha activity admixed with intermittent 2-3hz  delta slowing. Hyperventilation and photic stimulation were not performed.     ABNORMALITY - Continuous slow, generalized  IMPRESSION: This study is suggestive of moderate diffuse encephalopathy. No seizures or epileptiform discharges were seen throughout the recording.  Nevyn Bossman O Mujahid Jalomo

## 2024-01-31 NOTE — TOC Initial Note (Signed)
 Transition of Care [facility]) - Initial/Assessment Note    Patient Details  Name: [name] MRN: [id number] Date of Birth: 07-28-1945  [facility] CM/SW Contact:    Lauraine FORBES Saa, LCSWA Phone Number: 01/31/2024, 10:31 AM  Clinical Narrative:                  10:31 AM Per chart review, patient is from [facility]. SNF admissions confirmed patient is LTC at [facility] and able to return upon discharge. Patient has a PCP and insurance. Patient has HH and DME (rolling walker, BSC) history with [facility]. Patient's preferred pharmacy is [facility]. CSW will continue to follow and be available to assist.  Expected Discharge Plan: Long Term Nursing Home Barriers to Discharge: Continued Medical Work up   Patient Goals and CMS Choice            Expected Discharge Plan and Services In-house Referral: Clinical Social Work   Post Acute Care Choice: Skilled Nursing Facility Living arrangements for the past 2 months: Skilled Nursing Facility                                      Prior Living Arrangements/Services Living arrangements for the past 2 months: Skilled Nursing Facility Lives with:: Facility Resident Patient language and need for interpreter reviewed:: Yes        Need for Family Participation in Patient Care: Yes (Comment) Care giver support system in place?: Yes (comment) Current home services: DME Criminal Activity/Legal Involvement Pertinent to Current Situation/Hospitalization: No - Comment as needed  Activities of Daily Living      Permission Sought/Granted Permission sought to share information with : Family Supports, Oceanographer granted to share information with : No (Contact information on chart)  Share Information with NAME: Arya [name]  Permission granted to share info w AGENCY: [facility] LTC  Permission granted to share info w Relationship: Daughter  Permission  granted to share info w Contact Information: [phone]  Emotional Assessment   Attitude/Demeanor/Rapport: Unable to Assess Affect (typically observed): Unable to Assess   Alcohol  / Substance Use: Not Applicable Psych Involvement: No (comment)  Admission diagnosis:  ARF (acute renal failure) (HCC) [N17.9] Acute GI bleeding [K92.2] Acute kidney injury (HCC) [N17.9] Altered mental status, unspecified altered mental status type [R41.82] Patient Active Problem List   Diagnosis Date Noted   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   Acute hypoxic respiratory failure (HCC) 01/29/2024   Acute GI bleeding 01/29/2024   Anemia 01/28/2024   Leucocytosis 01/28/2024   Acute encephalopathy 01/28/2024   Bleeding 01/28/2024   History of DVT (deep vein thrombosis) 01/28/2024   Altered mental status 01/28/2024   Contusion of left foot 03/27/2023   Closed fracture of distal end of right fibula with routine healing 03/23/2023   Fibula fracture 03/23/2023   ARF (acute renal failure) (HCC) 10/27/2022   Weakness 10/27/2022   Venous stasis 10/27/2022   Community acquired pneumonia 07/03/2020   OSA (obstructive sleep apnea) 07/03/2020   CAP (community acquired pneumonia) 07/03/2020   Sepsis (HCC) 07/02/2020   History of anesthesia reaction 02/20/2020   Swelling of limb 02/20/2020   Chronic diastolic CHF (congestive heart failure) (HCC) 08/22/2019   Edema 07/29/2019   Prediabetes 07/07/2019   Depression 04/26/2019   Disease of thyroid gland 04/26/2019   GERD (gastroesophageal reflux disease) 04/26/2019   History of head injury 04/26/2019  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/26/2019   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   Spasm of lumbar paraspinous muscle 02/14/2019   Chronic pain syndrome 02/14/2019   Use of cane as ambulatory aid 01/04/2019   Acneiform eruption 07/09/2017   Lumbar spondylosis 04/20/2017   Lumbar facet arthropathy 04/20/2017   Lumbar degenerative disc disease 04/20/2017   Anxiety  11/05/2016   Facet arthritis of lumbar region 09/16/2016   Low back pain 07/25/2016   Morbid obesity with BMI of 40.0-44.9, adult (HCC) 06/03/2016   Acquired hypothyroidism 03/04/2016   Cognitive deficit as late effect of traumatic brain injury (HCC) 03/04/2016   Health care maintenance 03/04/2016   HTN, goal below 140/90 03/04/2016   Hypokalemia 03/04/2016   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   Localized primary osteoarthritis of lower leg, right 08/21/2015   Primary osteoarthritis of left knee 04/10/2015   Right knee pain 01/18/2013   PCP:  Lenon Layman ORN, MD Pharmacy:   [facility] [address] [place], [place] - [address] [place] [address] Phone: [phone] Fax: [phone]     Social Drivers of Health (SDOH) Social History: SDOH Screenings   Food Insecurity: Patient Unable To Answer (01/29/2024)  Housing: Patient Unable To Answer (01/29/2024)  Transportation Needs: Patient Unable To Answer (01/29/2024)  Utilities: Patient Unable To Answer (01/29/2024)  Depression (PHQ2-9): Low Risk  (11/11/2022)  Social Connections: Patient Unable To Answer (01/29/2024)  Tobacco Use: Low Risk  (01/28/2024)   SDOH Interventions:     Readmission Risk Interventions     No data to display

## 2024-01-31 NOTE — Progress Notes (Addendum)
 eLink Physician-Brief Progress Note Patient Name: [name] [facility] [name] DOB: [birth year] MRN: [id number]   Date of Service  01/31/2024  HPI/Events of Note  Pt with stool impaction. After she was disimpacted and had a bowel movement, she suddenly became hypotensive, with SBP 70s.   eICU Interventions  Will defer transfer for now given hypotension.  Will give back some volume in the form of an albumin  bolus.  If BP remains low, will plan to start midodrine  vs. Levophed  .         Quynh Basso M DELA CRUZ 01/31/2024, 8:45 PM  10:42 PM BP transiently improved while bolus was running, but now SBP back to 70s, MAP 55-63 IO indicate pt is net positive 3L since admission Will start levophed  now, titrate to target MAP >65.  Would start midodrine  now, hopefully this would allow levophed  to be titrated off.

## 2024-01-31 NOTE — Progress Notes (Signed)
 Nutrition Follow-up  DOCUMENTATION CODES:  Morbid obesity  INTERVENTION:  Initiate tube feeding via cortrak once in place: Osmolite 1.5 at 50 ml/h (1200 ml per day) Start at 20 and advance by 10mL every 12 hours to reach goal Prosource TF20 60 ml BID Provides 1960 kcal, 115 gm protein, 914 ml free water daily Thiamine  100mg  daily 1 packet Juven BID, each packet provides 95 calories, 2.5 grams of protein (collagen), and micronutrients to support wound healing  NUTRITION DIAGNOSIS:  Inadequate oral intake related to inability to eat as evidenced by NPO status. - remains applicable   GOAL:  Patient will meet greater than or equal to 90% of their needs - progressing   MONITOR:  Diet advancement, Labs  REASON FOR ASSESSMENT:  Consult Assessment of nutrition requirement/status  ASSESSMENT:  78 y.o. female with PMH of hypothyroidism, chronic respiratory failure,  chronic diastolic CHF, DVT on Eliquis, HTN, GERD, dementia who was recently admitted for E. coli bacteremia sepsis in the setting of COVID infection was discharged on January 17, 2024. She presented from rehab on 8/16 for bleeding from rectum and foley catheter. Admitted for renal renal failure requiring CRRT and encephalopathy.  Pt resting in bed at the time of assessment, keeps eyes closed the entire time and only said stop touching me. Good friend at bedside able to provide some hx. States that pt got moved to a nursing facility but that prior to that move, pt always had a good appetite. Unsure of weight changes, but states that it doesn't appear visually like she has lost any weight. Pt's weight up from ~ 1 year ago at last encounter but some could be related to fluid.   Pt able to be taken off CRRT this AM.   Still very lethargic, unable to participate in swallow evaluation and is NPO. Discussed with CCM resident, requests cortrak placement to provide medications and nutrition until more alert and able to support herself  orally.   Admit weight: 131.5 kg  Current weight: 128.6 kg   Intake/Output Summary (Last 24 hours) at 01/31/2024 1054 Last data filed at 01/31/2024 0900 Gross per 24 hour  Intake 825.39 ml  Output 1154 ml  Net -328.61 ml  Net IO Since Admission: 1,889.78 mL [01/31/24 1054]  Drains/Lines: Temporary HD catheter, right IJ UOP 72mL FMS no output recorded  Nutritionally Relevant Medications: Scheduled Meds:  insulin  aspart  0-6 Units Subcutaneous Q4H   pantoprazole  IV  40 mg Intravenous Q12H   thiamine  injection  100 mg Intravenous Daily   Continuous Infusions:  cefTRIAXone  (ROCEPHIN )  IV Stopped (01/30/24 1733)   PRN Meds: ondansetron   Labs Reviewed: BUN 6 Phosphorus 1.9 CBG ranges from 116-239 mg/dL over the last 24 hours HgbA1c 6.4%  NUTRITION - FOCUSED PHYSICAL EXAM: Flowsheet Row Most Recent Value  Orbital Region No depletion  Upper Arm Region No depletion  Thoracic and Lumbar Region No depletion  Buccal Region No depletion  Temple Region No depletion  Clavicle Bone Region No depletion  Clavicle and Acromion Bone Region No depletion  Scapular Bone Region Unable to assess  Dorsal Hand Unable to assess  Patellar Region Unable to assess  Anterior Thigh Region Unable to assess  Posterior Calf Region Unable to assess  Edema (RD Assessment) Moderate  Hair Reviewed  Eyes Reviewed  Mouth Reviewed  Skin Reviewed  [scattered bruising and ecchymosis]  Nails Reviewed    Diet Order:   Diet Order  Diet NPO time specified  Diet effective now                   EDUCATION NEEDS:  Not appropriate for education at this time  Skin:  Skin Assessment: Skin Integrity Issues: Skin Integrity Issues:: Stage II Stage II: Sacrum  Last BM:  8/17 - type 7  Height:  Ht Readings from Last 1 Encounters:  01/27/24 5' 6 (1.676 m)   Weight:  Wt Readings from Last 1 Encounters:  01/31/24 128.6 kg    Ideal Body Weight:  59.09 kg  BMI:  Body mass index is  45.76 kg/m.  Estimated Nutritional Needs:  Kcal:  1800-2000 kcal/d Protein:  105-120g/d Fluid:  2L/d    Vernell Lukes, RD, LDN, CNSC Registered Dietitian II Please reach out via secure chat

## 2024-01-31 NOTE — Progress Notes (Signed)
 NAME:  [name], MRN:  [id number], DOB:  1946/02/15, LOS: 3 ADMISSION DATE:  01/27/2024, CONSULTATION DATE:  01/31/24  REFERRING MD:  MELODIE, CHIEF COMPLAINT:  uremic encephalopathy   History of Present Illness:  [name] is a 78 y/o F with a PMH significant for chronic diastolic heart failure, DVT on Eliquis, suspected OHS, morbid obesity, and chronic hypoxic respiratory failure on 3 L O2 by [place] who presented to [facility] for hematuria and rectal bleeding with hospital course c/b acute renal failure, uremic encephalopathy, and hypotension requiring CRRT and ICU admission.   Pertinent  Medical History  As above  Significant Hospital Events: Including procedures, antibiotic start and stop dates in addition to other pertinent events   Received temp dialysis line After which had an iatrogenic PTX CRRT started 8/17 Extubated   Interim History / Subjective:  Patient follows some commands but appears altered and unable to provide further details   Objective    Blood pressure (!) 105/58, pulse 97, temperature 98.5 F (36.9 C), temperature source Oral, resp. rate 20, height 5' 6 (1.676 m), weight 128.6 kg, SpO2 100%. CVP:  [0 mmHg-2 mmHg] 0 mmHg  FiO2 (%):  [46 %-70 %] 46 %   Intake/Output Summary (Last 24 hours) at 01/31/2024 0805 Last data filed at 01/31/2024 0800 Gross per 24 hour  Intake 1124.75 ml  Output 1409 ml  Net -284.25 ml   Filed Weights   01/29/24 1504 01/30/24 0500 01/31/24 0145  Weight: 127.2 kg 133.5 kg 128.6 kg    Examination: General: ill appearing, morbidly obese  HENT: anicteric sclera, well injected conjunctivae, PERRL, dry oral mucosa Lungs: Decreased air entry bilaterally  Cardiovascular: regular rate and rhythm, distant breath sounds  Abdomen: soft, non tender, obese  Extremities: +1 edema, UE bruises  Neuro: Not following commands, opens eyes to voice    Resolved problem list   Assessment and Plan   Neuro:   #Acute Metabolic  Encephalopathy: Likely uremic encephalopathy versus sepsis mediated. CT head negative.   Uremia resolved with dialyses but mental status remains very limited suggestive that uremia was not the sole contributor to her altered mental status.  Patient baseline A&Ox2 only due to dementia. Hospital hypoactive delirium could also be contributing   - Can consider MRI and EEG in the future, no focal weakness noted at this time. Will continue supportive care. Intermittently followed commands today.   Cardiovascular: #Borderline Hypotension: - Can start levophed  if needed to meet MAP goal > 65 mmHg - Midodrine  on hold till enteral access obtained. Normotensive without it. Stopped today     Pulmonary:  #Acute on Chronic Hypoxic Respiratory Failure: #Iatrogenic Pneumothorax: due to difficult central line placement - Remains on HFNC, weaning slowly  - Serial CXRs reviewed and pneumothorax remains small, no need for tube thoracostomy. Plan to repeat CXR today: no evidence of pneumo today   ID: #Concern for UTI: blood cultures NGTD, UA +, Urine culture with yeast only. - Continue Ceftriaxone  (end date: 02/01/2024). To complete 5 days total    GI: #Concern for UGIB:  - PPI BID - Serial CBCs   #Nutrition: NPO given poor mental status - Plan for OG tube placement today  - Nutrition consult      GU/Renal:   #AKI on CKD stage 3b: - Renally adjust medications - Avoid Nephrotoxins - Strict I/Os - Foley catheter - Appreciate nephrology consult - Continue CRRT for now, may consider iHD given normotension   #High anion gap acidosis: Likely  due to renal failure - D/C bicarb drip   #Uremic encephalopathy: - Dialysis as above   #Hypophosphatemia: - Replete phos     Heme/Onc:   #Hx of DVT: heparin  subQ TID     Endocrine:   #DM: - Very sensitive SSI      MSK: - PT/OT   Disposition: ICU appropriate for AKI requiring CRRT and uremic encephalopathy.  Best Practice (right click and  Reselect all SmartList Selections daily)   Diet/type: NPO DVT prophylaxis prophylactic heparin   Pressure ulcer(s): N/A GI prophylaxis: PPI Lines: Dialysis Catheter Foley:  Yes, and it is still needed Code Status:  DNR Last date of multidisciplinary goals of care discussion [N/A]  Labs   CBC: Recent Labs  Lab 01/27/24 1723 01/27/24 1750 01/28/24 0409 01/28/24 0410 01/29/24 1501 01/29/24 1617 01/29/24 2309 01/30/24 0410 01/31/24 0253  WBC 22.2*  --  21.7*  --   --  19.9* 19.2* 20.5* 19.9*  NEUTROABS 20.3*  --  19.2*  --   --  17.3* 16.2*  --   --   HGB 11.6*   < > 10.6*  --  7.8* 8.2* 8.2* 8.4* 7.8*  HCT 33.6*   < > 32.1*  --  23.0* 24.8* 23.4* 24.4* 23.5*  MCV 92.1  --  95.8  --   --  94.7 91.1 91.7 96.3  PLT 204  --  194 188  --  99* 104* 97* 66*   < > = values in this interval not displayed.    Basic Metabolic Panel: Recent Labs  Lab 01/28/24 0409 01/29/24 0649 01/29/24 1501 01/29/24 1617 01/30/24 0410 01/30/24 1809 01/31/24 0253  NA 134* 136 135 136 135 136 136  K 4.0 4.3 3.2* 3.1* 3.3* 4.4 4.2  CL 99 107  --  104 101 100 103  CO2 19* 12*  --  21* 23 25 25   GLUCOSE 240* 130*  --  177* 174* 166* 131*  BUN 129* 134*  --  108* 34* 11 6*  CREATININE 3.27* 3.54*  --  2.87* 1.32* 0.81 0.63  CALCIUM 9.8 9.4  --  8.4* 8.0* 7.6* 7.9*  MG 2.1  --   --   --  2.0  --  2.1  PHOS 5.2* 4.8*  --  4.1 1.0* 3.4 1.9*   GFR: Estimated Creatinine Clearance: 79.6 mL/min (by C-G formula based on SCr of 0.63 mg/dL). Recent Labs  Lab 01/27/24 1631 01/27/24 1723 01/27/24 1845 01/28/24 0409 01/29/24 1617 01/29/24 2309 01/30/24 0410 01/31/24 0253  WBC  --    < >  --    < > 19.9* 19.2* 20.5* 19.9*  LATICACIDVEN 1.7  --  1.3  --   --   --   --   --    < > = values in this interval not displayed.    Liver Function Tests: Recent Labs  Lab 01/27/24 1723 01/28/24 0409 01/29/24 0649 01/29/24 1617 01/30/24 0410 01/30/24 1809 01/31/24 0253  AST 26 21  --   --   --   --    --   ALT 17 15  --   --   --   --   --   ALKPHOS 43 42  --   --   --   --   --   BILITOT 0.9 1.1  --   --   --   --   --   PROT 6.3* 5.8*  --   --   --   --   --  ALBUMIN  3.3* 3.0* 2.3* 2.0* 2.0* 1.9* 1.8*   No results for input(s): LIPASE, AMYLASE in the last 168 hours. Recent Labs  Lab 01/27/24 2343  AMMONIA 30    ABG    Component Value Date/Time   PHART 7.470 (H) 01/29/2024 1501   PCO2ART 25.6 (L) 01/29/2024 1501   PO2ART 72 (L) 01/29/2024 1501   HCO3 18.7 (L) 01/29/2024 1501   TCO2 19 (L) 01/29/2024 1501   ACIDBASEDEF 4.0 (H) 01/29/2024 1501   O2SAT 96 01/29/2024 1501     Coagulation Profile: Recent Labs  Lab 01/27/24 1723 01/28/24 0410  INR 1.9* 1.9*    Cardiac Enzymes: Recent Labs  Lab 01/28/24 0409 01/29/24 0649  CKTOTAL 98 68    HbA1C: Hgb A1c MFr Bld  Date/Time Value Ref Range Status  01/28/2024 04:09 AM 6.4 (H) 4.8 - 5.6 % Final    Comment:    (NOTE) Diagnosis of Diabetes The following HbA1c ranges recommended by the American Diabetes Association (ADA) may be used as an aid in the diagnosis of diabetes mellitus.  Hemoglobin             Suggested A1C NGSP%              Diagnosis  <5.7                   Non Diabetic  5.7-6.4                Pre-Diabetic  >6.4                   Diabetic  <7.0                   Glycemic control for                       adults with diabetes.      CBG: Recent Labs  Lab 01/30/24 1634 01/30/24 1957 01/30/24 2341 01/31/24 0246 01/31/24 0755  GLUCAP 183* 151* 126* 129* 116*    Review of Systems:   Not obtained.  Past Medical History:  She,  has a past medical history of Benign positional vertigo (03/04/2016), Brain injury (HCC), CHF (congestive heart failure) (HCC), Climacteric, Depression, DJD (degenerative joint disease), Dyslipidemia, GERD (gastroesophageal reflux disease), Hypertension, Hypokalemia, Hypothyroidism, IBS (irritable bowel syndrome), OAB (overactive bladder), Ovarian cyst  (03/25/2015), Rhinitis, Sleep apnea, and Spasm of thoracolumbar muscle.   Surgical History:   Past Surgical History:  Procedure Laterality Date   ABDOMINAL HYSTERECTOMY  07/12/2014   ANKLE RECONSTRUCTION Left    BRACHIOPLASTY     BREAST REDUCTION SURGERY Bilateral 2020   CATARACT EXTRACTION     CHOLECYSTECTOMY     COLONOSCOPY WITH PROPOFOL  N/A 12/07/2016   Procedure: COLONOSCOPY WITH PROPOFOL ;  Surgeon: Viktoria Lamar DASEN, MD;  Location: [facility] ENDOSCOPY;  Service: Endoscopy;  Laterality: N/A;   JOINT REPLACEMENT Bilateral 07/2014, 03/2015   nasal reconstrucion     REDUCTION MAMMAPLASTY Bilateral    SHOULDER OPEN ROTATOR CUFF REPAIR     SOFT TISSUE TUMOR RESECTION     excision tumor soft tissue neck, and soft tissue subcutaneous axilla   TOE SURGERY Right    reconstruction right foot 4th and 5th toe     Social History:   reports that she has never smoked. She has never used smokeless tobacco. She reports current alcohol  use of about 2.0 standard drinks of alcohol  per week. She reports that she does not use drugs.  Family History:  Her family history includes Breast cancer in her paternal aunt; Lung cancer in her father and mother.   Allergies Allergies  Allergen Reactions   Codeine Other (See Comments)    Seizure. Tolerates tramadol    Povidone Iodine Shortness Of Breath    Patient had difficulty breathing during procedure where dura prep was used to clean back.    Hydromorphone Hives, Itching and Other (See Comments)    Severe itching hallucinations   Latex Itching and Other (See Comments)    blisters   Oxycodone-Acetaminophen  Nausea And Vomiting and Itching   Sulfa Antibiotics Itching and Swelling    Swelling eyes, hands, feet. Was at home and didn't go to the hospital   Bupropion Other (See Comments)    Unknown reaction   Gabapentin Other (See Comments)    tremors   Hydrocodone     Tolerates tramadol    Morphine  And Codeine Itching and Rash   Procaine      Home  Medications  Prior to Admission medications   Medication Sig Start Date End Date Taking? Authorizing Provider  acetaminophen  (TYLENOL ) 325 MG tablet Take 650 mg by mouth every 6 (six) hours as needed for mild pain (pain score 1-3).   Yes [provider]  Amantadine  HCl 100 MG tablet Take 100 mg by mouth 2 (two) times daily. 03/21/23  Yes [provider]  amLODipine  (NORVASC ) 5 MG tablet Take 5 mg by mouth daily.   Yes [provider]  Apixaban (ELIQUIS DVT/PE STARTER PACK PO) Take 5 mg by mouth in the morning and at bedtime.   Yes [provider]  ascorbic acid  (VITAMIN C ) 500 MG tablet Take 500 mg by mouth daily.   Yes [provider]  baclofen  (LIORESAL ) 20 MG tablet Take 20 mg by mouth daily as needed for muscle spasms.   Yes [provider]  bumetanide (BUMEX) 1 MG tablet Take 1 mg by mouth 2 (two) times daily.   Yes [provider]  carvedilol  (COREG ) 25 MG tablet Take 50 mg by mouth 2 (two) times daily with a meal.   Yes [provider]  dexamethasone  (DECADRON ) 4 MG tablet Take 6 mg by mouth daily.   Yes [provider]  donepezil  (ARICEPT ) 10 MG tablet Take 10 mg by mouth daily.   Yes [provider]  ferrous sulfate 325 (65 FE) MG tablet Take 325 mg by mouth daily with breakfast.   Yes [provider]  fluconazole (DIFLUCAN) 100 MG tablet Take 100 mg by mouth daily. For 7 days   Yes [provider]  furosemide  (LASIX ) 20 MG tablet Take 20 mg by mouth every morning.   Yes [provider]  hydrOXYzine (ATARAX) 25 MG tablet Take 25 mg by mouth every 6 (six) hours as needed for itching. 10/18/23  Yes [provider]  ipratropium-albuterol  (DUONEB) 0.5-2.5 (3) MG/3ML SOLN Take 3 mLs by nebulization every 4 (four) hours as needed (for congestion/wheezing).   Yes [provider]  levofloxacin (LEVAQUIN) 750 MG tablet Take 750 mg by mouth daily. For 14 days   Yes  [provider]  levothyroxine  (SYNTHROID , LEVOTHROID) 112 MCG tablet Take 112 mcg by mouth daily.   Yes [provider]  losartan  (COZAAR ) 100 MG tablet Take 100 mg by mouth daily.   Yes [provider]  montelukast  (SINGULAIR ) 10 MG tablet Take 10 mg by mouth every evening.   Yes [provider]  nystatin  (MYCOSTATIN ) 100000 UNIT/ML suspension  Take 5 mLs by mouth in the morning, at noon, and at bedtime. For 10 days   Yes [provider]  nystatin  (MYCOSTATIN /NYSTOP ) powder Apply topically 2 (two) times daily. Patient taking differently: Apply 1 Application topically 2 (two) times daily as needed. 07/06/20  Yes Von Bellis, MD  omeprazole (PRILOSEC) 20 MG capsule Take 20 mg by mouth daily.   Yes [provider]  saccharomyces boulardii (FLORASTOR) 250 MG capsule Take 250 mg by mouth daily.   Yes [provider]  spironolactone  (ALDACTONE ) 25 MG tablet Take 25 mg by mouth daily. 10/15/22  Yes [provider]  topiramate  (TOPAMAX ) 25 MG tablet Take 25 mg by mouth daily.   Yes [provider]  traZODone  (DESYREL ) 50 MG tablet Take 50 mg by mouth at bedtime.   Yes [provider]  venlafaxine  XR (EFFEXOR -XR) 75 MG 24 hr capsule Take 75 mg by mouth daily with breakfast. 10/17/22  Yes [provider]           The patient is critically ill due to acute hypoxic respiratory failure on heated high flow. AMS with poor mentation and concern for airway protection.  Critical care was necessary to treat or prevent imminent or life-threatening deterioration including frequent assessments of mentation to assess ability to protect airway. Critical care time was spent by me on the following activities: development of a treatment plan with the patient and/or surrogate as well as nursing, evaluation of the patient's response to treatment, examination of the patient, obtaining a history from the patient or surrogate,  ordering and performing treatments and interventions, ordering and review of laboratory studies, ordering and review of radiographic studies, review of telemetry data including pulse oximetry, re-evaluation of patient's condition and participation in multidisciplinary rounds.   I personally spent 31 minutes providing critical care not including any separately billable procedures.   Zola LOISE Herter, MD [facility] 01/31/2024 3:05 PM    Zola LOISE Herter, MD [facility] 01/31/2024 8:06 AM

## 2024-01-31 NOTE — Progress Notes (Signed)
 OT Cancellation Note  Patient Details Name: [name] MRN: [id number] DOB: 1946/06/02   Cancelled Treatment:    Reason Eval/Treat Not Completed: Medical issues which prohibited therapy;Patient not medically ready Continued CRRT, high O2 needs and AMS. Will hold any therapy attempts today and check back at a later date.  Mliss Fish 01/31/2024, 6:56 AM

## 2024-01-31 NOTE — Progress Notes (Addendum)
 [facility] Progress Note   Assessment/ Plan:    AKI on CKD 3b - baseline Cr 1.3 and creatinine rose to around 3.3 with elevated BUN - startted CRRT 8/16 - no real improvement in  MS after CRRT initiation - recommend stopping CRRT now and re-addressing GOC--> may be able to transition to IHD but given overall her state of care she is a poor dialysis candidate and if mental status does not improve would not be able to tolerate dialysis outpatient  2.  Metabolic acidosis:  - Resolved with CRRT  3 Acute encephalopathy:  - Likely multifactorial.  No improvement with dialysis.  Management per primary team and neurology    4 possible GIB  - Holding anticoagulation.  Further workup per primary team  5.  Acute hypxic RF: With iatrogenic pneumothorax  - on HHFC  6.  Possible UTI: Antibiotics and workup per primary team  7.  Dispo: in ICU. Recommend GOC  Subjective:    Mental status continues to be poor.  Possibly some nodding.  Continues on CRRT without issues   Objective:   BP (!) 105/58   Pulse 97   Temp 98.5 F (36.9 C) (Oral)   Resp 20   Ht 5' 6 (1.676 m)   Wt 128.6 kg   SpO2 100%   BMI 45.76 kg/m   Intake/Output Summary (Last 24 hours) at 01/31/2024 0915 Last data filed at 01/31/2024 0800 Gross per 24 hour  Intake 999.93 ml  Output 1280 ml  Net -280.07 ml   Weight change: 1.4 kg  Physical Exam: Gen:ill-appearing, on HHFNC CVS: Tachycardic, no rub Resp: Bilateral chest rise, no increased work of breathing Abd: large, + pannus, nontender Ext: no LE edema, warm and well-perfused Skin: extensive bruising on Upper extremities  Imaging: EEG adult Result Date: 01/31/2024 Shelton Arlin KIDD, MD     01/31/2024  8:51 AM Patient Name: [name] MRN: [id number] Epilepsy Attending: Arlin KIDD Shelton Referring Physician/Provider: Catherine Cools, MD Date: 01/30/2024 Duration: 23.42 mins Patient history: 78yo F with ams. EEG to evaluate for  seizure Level of alertness: Awake/ lethargic AEDs during EEG study: None Technical aspects: This EEG study was done with scalp electrodes positioned according to the 10-20 International system of electrode placement. Electrical activity was reviewed with band pass filter of 1-70Hz , sensitivity of 7 uV/mm, display speed of 88mm/sec with a 60Hz  notched filter applied as appropriate. EEG data were recorded continuously and digitally stored.  Video monitoring was available and reviewed as appropriate. Description: EEG showed continuous generalized 5 to 9 Hz theta-alpha activity admixed with intermittent 2-3hz  delta slowing. Hyperventilation and photic stimulation were not performed.   ABNORMALITY - Continuous slow, generalized IMPRESSION: This study is suggestive of moderate diffuse encephalopathy. No seizures or epileptiform discharges were seen throughout the recording. Priyanka KIDD Shelton   DG CHEST PORT 1 VIEW Result Date: 01/30/2024 CLINICAL DATA:  Evaluate pneumothorax EXAM: PORTABLE CHEST 1 VIEW COMPARISON:  Same day radiograph at 10:01 a.m. FINDINGS: Right IJ CVC tip in the mid SVC. Stable cardiomediastinal silhouette. Low lung volumes accentuate pulmonary vascularity. No evidence of pneumothorax. Similar right upper lobe atelectasis. Improving opacification in the left lung base, favor confluence of shadows. No definite pleural effusion. IMPRESSION: No evidence of pneumothorax. Electronically Signed   By: Norman Gatlin M.D.   On: 01/30/2024 23:21   DG CHEST PORT 1 VIEW Result Date: 01/30/2024 CLINICAL DATA:  Pneumothorax. EXAM: PORTABLE CHEST 1 VIEW COMPARISON:  Radiograph earlier today. FINDINGS: Stable  small right apical pneumothorax. Right-sided central line remains in place. Low lung volumes persist. Stable heart size and mediastinal contours. Right upper lobe atelectasis is similar. Question of increasing left lung base atelectasis versus soft tissue attenuation. No significant pleural effusion.  IMPRESSION: 1. Stable small right apical pneumothorax. 2. Right upper lobe atelectasis is similar. 3. Question of increasing left lung base atelectasis versus soft tissue attenuation. Electronically Signed   By: Andrea Gasman M.D.   On: 01/30/2024 12:18   DG Chest Port 1 View Result Date: 01/30/2024 CLINICAL DATA:  Hypoxia EXAM: PORTABLE CHEST 1 VIEW COMPARISON:  01/29/2024 FINDINGS: Right dialysis catheter tip in the SVC, unchanged. Right apical pneumothorax again noted, stable. Airspace opacity in the right upper lobe, likely atelectasis. Left lung clear. Heart and mediastinal contours are within normal limits. IMPRESSION: Stable small right apical pneumothorax. Electronically Signed   By: Franky Crease M.D.   On: 01/30/2024 02:52   DG Chest Port 1 View Result Date: 01/29/2024 CLINICAL DATA:  Mo thorax follow-up EXAM: PORTABLE CHEST 1 VIEW COMPARISON:  01/29/2024, chest CT 01/28/2024 FINDINGS: Right-sided central venous catheter tip at the cavoatrial region. Small right apical pneumothorax, demonstrates about 17 mm pleural-parenchymal separation compared with 16 mm previously. No acute airspace disease or pleural effusion. Stable mild cardiomegaly. Mitral annular calcification IMPRESSION: 1. Similar size of small right apical pneumothorax. 2. Mild cardiomegaly Electronically Signed   By: Luke Bun M.D.   On: 01/29/2024 19:51   DG CHEST PORT 1 VIEW Result Date: 01/29/2024 CLINICAL DATA:  Central line placement. EXAM: PORTABLE CHEST 1 VIEW COMPARISON:  A [id number]. FINDINGS: The heart size and mediastinal contours are within normal limits. There is atherosclerotic calcification aorta. There is a right internal jugular central venous catheter which terminates over the superior vena cava. There is a suspected small right apical pneumothorax of approximately 10%. Airspace disease is noted in the mid right lung, possible atelectasis. The left lung is clear. No effusion is seen. No acute osseous abnormality.  IMPRESSION: Interval placement of right internal jugular central venous catheter with small right apical pneumothorax. Critical Value/emergent results were called by telephone at the time of interpretation on 01/29/2024 at 3:00 pm to provider FAHID ALGHANIM , who verbally acknowledged these results. Electronically Signed   By: Leita Birmingham M.D.   On: 01/29/2024 15:00    Labs: BMET Recent Labs  Lab 01/27/24 1723 01/27/24 1750 01/28/24 0409 01/29/24 0649 01/29/24 1501 01/29/24 1617 01/30/24 0410 01/30/24 1809 01/31/24 0253  NA 131*   < > 134* 136 135 136 135 136 136  K 4.3   < > 4.0 4.3 3.2* 3.1* 3.3* 4.4 4.2  CL 95*  --  99 107  --  104 101 100 103  CO2 21*  --  19* 12*  --  21* 23 25 25   GLUCOSE 241*  --  240* 130*  --  177* 174* 166* 131*  BUN 127*  --  129* 134*  --  108* 34* 11 6*  CREATININE 3.09*  --  3.27* 3.54*  --  2.87* 1.32* 0.81 0.63  CALCIUM 10.3  --  9.8 9.4  --  8.4* 8.0* 7.6* 7.9*  PHOS  --   --  5.2* 4.8*  --  4.1 1.0* 3.4 1.9*   < > = values in this interval not displayed.   CBC Recent Labs  Lab 01/27/24 1723 01/27/24 1750 01/28/24 0409 01/28/24 0410 01/29/24 1617 01/29/24 2309 01/30/24 0410 01/31/24 0253  WBC 22.2*  --  21.7*  --  19.9* 19.2* 20.5* 19.9*  NEUTROABS 20.3*  --  19.2*  --  17.3* 16.2*  --   --   HGB 11.6*   < > 10.6*   < > 8.2* 8.2* 8.4* 7.8*  HCT 33.6*   < > 32.1*   < > 24.8* 23.4* 24.4* 23.5*  MCV 92.1  --  95.8  --  94.7 91.1 91.7 96.3  PLT 204  --  194   < > 99* 104* 97* 66*   < > = values in this interval not displayed.    Medications:     Chlorhexidine  Gluconate Cloth  6 each Topical Daily   Gerhardt's butt cream   Topical TID   heparin  injection (subcutaneous)  5,000 Units Subcutaneous Q8H   insulin  aspart  0-6 Units Subcutaneous Q4H   mupirocin  ointment   Nasal BID   pantoprazole  (PROTONIX ) IV  40 mg Intravenous Q12H   thiamine  (VITAMIN B1) injection  100 mg Intravenous Daily   Jayson JINNY Player  01/31/2024, 9:15 AM

## 2024-01-31 NOTE — Progress Notes (Signed)
 Removed two pairs of grey earrings from patients ears (2 on each ear), and placed in a labeled specimen cup at bedside.

## 2024-01-31 NOTE — Progress Notes (Signed)
 SLP Cancellation Note  Patient Details Name: CHENOAH MCNALLY [name] MRN: [id number] DOB: 09-28-1945   Cancelled treatment:       Reason Eval/Treat Not Completed: Patient not medically ready   Heaven Wandell, Consuelo Fitch 01/31/2024, 9:13 AM

## 2024-02-01 ENCOUNTER — Inpatient Hospital Stay (HOSPITAL_COMMUNITY)

## 2024-02-01 DIAGNOSIS — G9341 Metabolic encephalopathy: Secondary | ICD-10-CM

## 2024-02-01 DIAGNOSIS — R579 Shock, unspecified: Secondary | ICD-10-CM

## 2024-02-01 LAB — GLUCOSE, CAPILLARY
Glucose-Capillary: 241 mg/dL — ABNORMAL HIGH (ref 70–99)
Glucose-Capillary: 253 mg/dL — ABNORMAL HIGH (ref 70–99)
Glucose-Capillary: 273 mg/dL — ABNORMAL HIGH (ref 70–99)
Glucose-Capillary: 211 mg/dL — ABNORMAL HIGH (ref 70–99)
Glucose-Capillary: 121 mg/dL — ABNORMAL HIGH (ref 70–99)

## 2024-02-01 LAB — CBC
HCT: 22.7 % — ABNORMAL LOW (ref 36.0–46.0)
Hemoglobin: 7.1 g/dL — ABNORMAL LOW (ref 12.0–15.0)
MCH: 31.4 pg (ref 26.0–34.0)
MCHC: 31.3 g/dL (ref 30.0–36.0)
MCV: 100.4 fL — ABNORMAL HIGH (ref 80.0–100.0)
Platelets: 65 K/uL — ABNORMAL LOW (ref 150–400)
RBC: 2.26 MIL/uL — ABNORMAL LOW (ref 3.87–5.11)
RDW: 14 % (ref 11.5–15.5)
WBC: 14 K/uL — ABNORMAL HIGH (ref 4.0–10.5)
nRBC: 0.4 % — ABNORMAL HIGH (ref 0.0–0.2)

## 2024-02-01 LAB — TYPE AND SCREEN
ABO/RH(D): B POS
Antibody Screen: NEGATIVE

## 2024-02-01 LAB — RENAL FUNCTION PANEL
Albumin: 2.2 g/dL — ABNORMAL LOW (ref 3.5–5.0)
Anion gap: 9 (ref 5–15)
BUN: 32 mg/dL — ABNORMAL HIGH (ref 8–23)
CO2: 24 mmol/L (ref 22–32)
Calcium: 8.3 mg/dL — ABNORMAL LOW (ref 8.9–10.3)
Chloride: 101 mmol/L (ref 98–111)
Creatinine, Ser: 1.64 mg/dL — ABNORMAL HIGH (ref 0.44–1.00)
GFR, Estimated: 32 mL/min — ABNORMAL LOW (ref >60)
Glucose, Bld: 262 mg/dL — ABNORMAL HIGH (ref 70–99)
Phosphorus: 2.2 mg/dL — ABNORMAL LOW (ref 2.5–4.6)
Potassium: 3.8 mmol/L (ref 3.5–5.1)
Sodium: 134 mmol/L — ABNORMAL LOW (ref 135–145)

## 2024-02-01 LAB — BLOOD GAS, VENOUS
Acid-base deficit: 1.2 mmol/L (ref 0.0–2.0)
Bicarbonate: 23.6 mmol/L (ref 20.0–28.0)
Drawn by: 14113
O2 Saturation: 76.6 %
Patient temperature: 36.6
pCO2, Ven: 38 mmHg — ABNORMAL LOW (ref 44–60)
pH, Ven: 7.4 (ref 7.25–7.43)
pO2, Ven: 37 mmHg (ref 32–45)

## 2024-02-01 LAB — PHOSPHORUS: Phosphorus: 2.2 mg/dL — ABNORMAL LOW (ref 2.5–4.6)

## 2024-02-01 LAB — MAGNESIUM: Magnesium: 2.2 mg/dL (ref 1.7–2.4)

## 2024-02-01 MED ORDER — ORAL CARE MOUTH RINSE
15.0000 mL | OROMUCOSAL | Status: DC
  Administered 2024-02-01 (×2): 15 mL via OROMUCOSAL

## 2024-02-01 MED ORDER — PIPERACILLIN-TAZOBACTAM IN DEX 2-0.25 GM/50ML IV SOLN
2.2500 g | Freq: Three times a day (TID) | INTRAVENOUS | Status: DC
  Filled 2024-02-01 (×2): qty 50

## 2024-02-01 MED ORDER — ACETAMINOPHEN 325 MG PO TABS: 650.0000 mg | ORAL_TABLET | Freq: Four times a day (QID) | ORAL | Status: DC | PRN

## 2024-02-01 MED ORDER — ACETAMINOPHEN 650 MG RE SUPP: 650.0000 mg | Freq: Four times a day (QID) | RECTAL | Status: DC | PRN

## 2024-02-01 MED ORDER — FENTANYL 2500MCG IN NS 250ML (10MCG/ML) PREMIX INFUSION
75.0000 ug/h | INTRAVENOUS | Status: DC
  Administered 2024-02-01 – 2024-02-05 (×3): 50 ug/h via INTRAVENOUS
  Administered 2024-02-07: 75 ug/h via INTRAVENOUS
  Administered 2024-02-09: 10 ug/h via INTRAVENOUS
  Filled 2024-02-01 (×7): qty 250

## 2024-02-01 MED ORDER — GLYCOPYRROLATE 0.2 MG/ML IJ SOLN: 0.2000 mg | INTRAMUSCULAR | Status: DC | PRN

## 2024-02-01 MED ORDER — GLYCOPYRROLATE 1 MG PO TABS
1.0000 mg | ORAL_TABLET | ORAL | Status: DC | PRN
  Filled 2024-02-01: qty 1

## 2024-02-01 MED ORDER — POLYVINYL ALCOHOL 1.4 % OP SOLN
1.0000 [drp] | Freq: Four times a day (QID) | OPHTHALMIC | Status: DC | PRN
  Filled 2024-02-01: qty 15

## 2024-02-01 MED ORDER — NOREPINEPHRINE 16 MG/250ML-% IV SOLN
0.0000 ug/min | INTRAVENOUS | Status: DC
  Administered 2024-02-01: 20 ug/min via INTRAVENOUS
  Filled 2024-02-01: qty 250

## 2024-02-01 MED ORDER — INSULIN ASPART 100 UNIT/ML IJ SOLN
0.0000 [IU] | INTRAMUSCULAR | Status: DC
  Administered 2024-02-01: 5 [IU] via SUBCUTANEOUS

## 2024-02-01 MED ORDER — FENTANYL BOLUS VIA INFUSION
100.0000 ug | INTRAVENOUS | Status: DC | PRN
  Filled 2024-02-01: qty 100

## 2024-02-01 MED ORDER — VANCOMYCIN VARIABLE DOSE PER UNSTABLE RENAL FUNCTION (PHARMACIST DOSING): Status: DC

## 2024-02-01 MED ORDER — VASOPRESSIN 20 UNITS/100 ML INFUSION FOR SHOCK
0.0000 [IU]/min | INTRAVENOUS | Status: DC
  Filled 2024-02-01: qty 100

## 2024-02-01 MED ORDER — VANCOMYCIN HCL 2000 MG/400ML IV SOLN
2000.0000 mg | Freq: Once | INTRAVENOUS | Status: AC
  Administered 2024-02-01: 2000 mg via INTRAVENOUS
  Filled 2024-02-01: qty 400

## 2024-02-01 MED ORDER — SODIUM CHLORIDE 0.9 % IV SOLN: INTRAVENOUS | Status: AC

## 2024-02-01 MED ORDER — MORPHINE 100MG IN NS 100ML (1MG/ML) PREMIX INFUSION: 0.0000 mg/h | INTRAVENOUS | Status: DC

## 2024-02-01 MED ORDER — ORAL CARE MOUTH RINSE: 15.0000 mL | OROMUCOSAL | Status: DC | PRN

## 2024-02-01 MED ORDER — MORPHINE BOLUS VIA INFUSION: 5.0000 mg | INTRAVENOUS | Status: DC | PRN

## 2024-02-01 MED ORDER — GLYCOPYRROLATE 0.2 MG/ML IJ SOLN
0.2000 mg | INTRAMUSCULAR | Status: DC | PRN
Start: 2024-02-01 — End: 2024-02-09

## 2024-02-01 NOTE — Inpatient Diabetes Management (Signed)
 Inpatient Diabetes Program Recommendations  AACE/ADA: New Consensus Statement on Inpatient Glycemic Control (2015)  Target Ranges:  Prepandial:   less than 140 mg/dL      Peak postprandial:   less than 180 mg/dL (1-2 hours)      Critically ill patients:  140 - 180 mg/dL   Lab Results  Component Value Date   GLUCAP 273 (H) 02/01/2024   HGBA1C 6.4 (H) 01/28/2024    Review of Glycemic Control  Latest Reference Range & Units 01/31/24 23:16 02/01/24 03:17 02/01/24 03:23 02/01/24 07:46  Glucose-Capillary 70 - 99 mg/dL 809 (H) 746 (H) 758 (H) 273 (H)  (H): Data is abnormally high  Diabetes history: DM2 Outpatient Diabetes medications: None Current orders for Inpatient glycemic control: Novolog  0-15 units Q4H, Osmolite @ 20 ml/hr (goal 60 ml/hr)  Inpatient Diabetes Program Recommendations:    Please consider:  Novolog  3 units Q4H tube feed coverage; stop if feeds held or discontinued.    Thank you, Wyvonna Pinal, MSN, CDCES Diabetes Coordinator Inpatient Diabetes Program [phone] (team pager from 8a-5p)

## 2024-02-01 NOTE — IPAL (Signed)
  Interdisciplinary Goals of Care Family Meeting   Date carried out: 02/01/2024  Location of the meeting: Phone conference  Member's involved: Physician  Durable Power of Attorney or acting medical decision maker: [name]  ( Step Daughter ). Patient has no biological children   Discussion: We discussed goals of care for [name] .    The patient's overall prognosis was discussed with her daughter. We reviewed the patient's continued dependence on vasopressors and reaffirmed her code status. It was noted that the patient's husband is cognitively impaired and unable to participate in decision-making at this time.  The daughter expressed concern regarding the patient's progressive deconditioning over time. After a thorough discussion, she agreed to transition the patient to comfort-focused care.  All of the daughter's questions regarding comfort care measures were addressed in detail. She elected for in-hospital death rather than discharge to the nursing home. Spiritual support and counseling services were also offered, should the daughter wish to utilize them.  Code status:   Code Status: Limited: Do not attempt resuscitation (DNR) -DNR-LIMITED -Do Not Intubate/DNI    Disposition: In-patient comfort care  Time spent for the meeting: 26 minutes     Drue Grow, MD  02/01/2024, 1:30 PM

## 2024-02-01 NOTE — Progress Notes (Signed)
 Brief Nephrology Note  Patient's family has opted to focus on comfort. Agree given overall concerning picture. We will sign off at this time.

## 2024-02-01 NOTE — TOC Progression Note (Signed)
 Transition of Care [facility]) - Progression Note    Patient Details  Name: [name] MRN: [id number] Date of Birth: [birth year]  Transition of Care [facility] CM/SW Contact  Lauraine FORBES Saa, LCSWA Phone Number: 02/01/2024, 1:58 PM  Clinical Narrative:     1:58 PM Per Resident MD, patient is to transition to comfort care today and is anticipate to have an in-hospital death.  Expected Discharge Plan: Long Term Nursing Home Barriers to Discharge: Continued Medical Work up               Expected Discharge Plan and Services In-house Referral: Clinical Social Work   Post Acute Care Choice: Skilled Nursing Facility Living arrangements for the past 2 months: Skilled Nursing Facility                                       Social Drivers of Health (SDOH) Interventions SDOH Screenings   Food Insecurity: Patient Unable To Answer (01/29/2024)  Housing: Patient Unable To Answer (01/29/2024)  Transportation Needs: Patient Unable To Answer (01/29/2024)  Utilities: Patient Unable To Answer (01/29/2024)  Depression (PHQ2-9): Low Risk  (11/11/2022)  Social Connections: Patient Unable To Answer (01/29/2024)  Tobacco Use: Low Risk  (01/28/2024)    Readmission Risk Interventions     No data to display

## 2024-02-01 NOTE — Progress Notes (Signed)
 SLP Cancellation Note  Patient Details Name: [name] MRN: [id number] DOB: 1946/06/11   Cancelled treatment:       Reason Eval/Treat Not Completed: Patient not medically ready. Remains too lethargic to participate with poor prognosis. Will sign off   Sheyna Pettibone, Consuelo Fitch 02/01/2024, 12:01 PM

## 2024-02-01 NOTE — Progress Notes (Signed)
 NAME:  [name], MRN:  [id number], DOB:  1945/06/19, LOS: 4 ADMISSION DATE:  01/27/2024, CONSULTATION DATE:  02/01/24  REFERRING MD:  MELODIE, CHIEF COMPLAINT:  uremic encephalopathy   History of Present Illness:  [name] is a 78 y/o F with a PMH significant for chronic diastolic heart failure, DVT on Eliquis, suspected OHS, morbid obesity, and chronic hypoxic respiratory failure on 3 L O2 by [place] who presented to [facility] for hematuria and rectal bleeding with hospital course c/b acute renal failure, uremic encephalopathy, and hypotension requiring CRRT and ICU admission.   Pertinent  Medical History  As above  Significant Hospital Events: Including procedures, antibiotic start and stop dates in addition to other pertinent events   Received temp dialysis line After which had an iatrogenic PTX CRRT started   Interim History / Subjective:  Patient follows some commands but appears altered and unable to provide further details   Objective    Blood pressure (!) 121/47, pulse 79, temperature 99.4 F (37.4 C), temperature source Axillary, resp. rate 15, height 5' 6 (1.676 m), weight 131.1 kg, SpO2 98%.    FiO2 (%):  [30 %-46 %] 30 %   Intake/Output Summary (Last 24 hours) at 02/01/2024 0750 Last data filed at 02/01/2024 0600 Gross per 24 hour  Intake 867.96 ml  Output -1 ml  Net 868.96 ml   Filed Weights   01/30/24 0500 01/31/24 0145 02/01/24 0530  Weight: 133.5 kg 128.6 kg 131.1 kg    Examination: Physical Exam Constitutional:      Appearance: She is ill-appearing and toxic-appearing.  HENT:     Head: Normocephalic and atraumatic.     Mouth/Throat:     Mouth: Mucous membranes are dry.  Eyes:     Extraocular Movements: Extraocular movements intact.     Pupils: Pupils are equal, round, and reactive to light.  Cardiovascular:     Rate and Rhythm: Normal rate and regular rhythm.     Pulses: Normal pulses.  Pulmonary:     Effort: Pulmonary effort is  normal.     Breath sounds: Rhonchi present.  Abdominal:     General: Abdomen is flat.     Palpations: Abdomen is soft.     Comments: Mild grimace to palpation  Skin:    General: Skin is warm.     Capillary Refill: Capillary refill takes less than 2 seconds.  Neurological:     Mental Status: She is disoriented.     Comments: Unresponsive, intermittently follows commands      General: ill appearing, morbidly obese  HENT: anicteric sclera, well injected conjunctivae, PERRL, dry oral mucosa Lungs: Decreased air entry bilaterally  Cardiovascular: regular rate and rhythm, distant breath sounds  Abdomen: soft, non tender, obese  Extremities: +1 edema, UE bruises  Neuro: Not following commands, opens eyes to voice    Resolved problem list   Assessment and Plan   Neuro:  #Acute Metabolic Encephalopathy: Likely uremic encephalopathy versus sepsis mediated. CT head negative.  Uremia resolved with dialyses but mental status remains very limited suggestive that uremia was not the sole contributor to her altered mental status.  Patient baseline A&Ox2 only due to dementia. Hospital hypoactive delirium could also be contributing  - Can consider MRI and EEG in the future, no focal weakness noted at this time. Will continue supportive care.  - Will discuss with palliative care regarding GOC today. Remains minimally responsive.  Attempted to call daughter in law today but no answer, left voicemail  to call back    Cardiovascular: #Shock on pressors. Suspect septic shock. Continues to have leukocytosis despite antibiotics. Low grade fevers overnight  - Currently BP maintained with levophed   - Difficult to obtain pressurs with cuff on leg  - Add vaso if needed. Weaning pressors slowly    Pulmonary: #Acute on Chronic Hypoxic Respiratory Failure (resolved) #Iatrogenic Pneumothorax: due to difficult central line placement (resolved) - Remains on HFNC, weaning slowly  - Check VBG  - CXR with  low lung volumes but otherwise no new infiltrate anywhere    ID: #Sepsis  #Concern for UTI: blood cultures NGTD, UA +, Urine culture with yeast only. - Continue Ceftriaxone  (end date: 02/01/2024). To complete 5 days total  - Escalate antibiotics empirically to vanc and Zosyn  for 48 hours  - Repeat blood cultures  - D/C foley    GI: #Concern for UGIB:  - PPI BID  - Serial CBCs - Stool disimpaction overnight after disimpaction developed hypotension, no blood noted.  Checking CBC again today.    #Nutrition:  - Started on tube feeds through OG yesterday. Can resume tube feeds today     GU/Renal:   #AKI on CKD stage 3b: - Renally adjust medications - Avoid Nephrotoxins - Strict I/Os - Remove foley, not making urine  - Appreciate nephrology consult - Holding CRRT, may consider iHD given normotension - Palliative to discuss HD with family given overall clinical picture    #High anion gap acidosis: Likely due to renal failure - D/C bicarb drip   #Uremic encephalopathy: - Dialysis as above   #Hypophosphatemia: - Replete phos     Heme/Onc:  #Hx of DVT: heparin  subQ TID     Endocrine:  #DM: - Moderate SSI instead of sensitive    MSK: - PT/OT   Disposition: ICU appropriate for AKI requiring CRRT and uremic encephalopathy.  Best Practice (right click and Reselect all SmartList Selections daily)   Diet/type: NPO DVT prophylaxis prophylactic heparin   Pressure ulcer(s): N/A GI prophylaxis: PPI Lines: Dialysis Catheter Foley:  Yes, and it is still needed Code Status:  DNR Last date of multidisciplinary goals of care discussion [N/A]  Labs   CBC: Recent Labs  Lab 01/27/24 1723 01/27/24 1750 01/28/24 0409 01/28/24 0410 01/29/24 1501 01/29/24 1617 01/29/24 2309 01/30/24 0410 01/31/24 0253  WBC 22.2*  --  21.7*  --   --  19.9* 19.2* 20.5* 19.9*  NEUTROABS 20.3*  --  19.2*  --   --  17.3* 16.2*  --   --   HGB 11.6*   < > 10.6*  --  7.8* 8.2* 8.2* 8.4* 7.8*   HCT 33.6*   < > 32.1*  --  23.0* 24.8* 23.4* 24.4* 23.5*  MCV 92.1  --  95.8  --   --  94.7 91.1 91.7 96.3  PLT 204  --  194 188  --  99* 104* 97* 66*   < > = values in this interval not displayed.    Basic Metabolic Panel: Recent Labs  Lab 01/28/24 0409 01/29/24 0649 01/29/24 1501 01/29/24 1617 01/30/24 0410 01/30/24 1809 01/31/24 0253 02/01/24 0300  NA 134* 136 135 136 135 136 136  --   K 4.0 4.3 3.2* 3.1* 3.3* 4.4 4.2  --   CL 99 107  --  104 101 100 103  --   CO2 19* 12*  --  21* 23 25 25   --   GLUCOSE 240* 130*  --  177* 174* 166* 131*  --  BUN 129* 134*  --  108* 34* 11 6*  --   CREATININE 3.27* 3.54*  --  2.87* 1.32* 0.81 0.63  --   CALCIUM 9.8 9.4  --  8.4* 8.0* 7.6* 7.9*  --   MG 2.1  --   --   --  2.0  --  2.1 2.2  PHOS 5.2* 4.8*  --  4.1 1.0* 3.4 1.9* 2.2*   GFR: Estimated Creatinine Clearance: 80.5 mL/min (by C-G formula based on SCr of 0.63 mg/dL). Recent Labs  Lab 01/27/24 1631 01/27/24 1723 01/27/24 1845 01/28/24 0409 01/29/24 1617 01/29/24 2309 01/30/24 0410 01/31/24 0253  WBC  --    < >  --    < > 19.9* 19.2* 20.5* 19.9*  LATICACIDVEN 1.7  --  1.3  --   --   --   --   --    < > = values in this interval not displayed.    Liver Function Tests: Recent Labs  Lab 01/27/24 1723 01/28/24 0409 01/29/24 0649 01/29/24 1617 01/30/24 0410 01/30/24 1809 01/31/24 0253  AST 26 21  --   --   --   --   --   ALT 17 15  --   --   --   --   --   ALKPHOS 43 42  --   --   --   --   --   BILITOT 0.9 1.1  --   --   --   --   --   PROT 6.3* 5.8*  --   --   --   --   --   ALBUMIN  3.3* 3.0* 2.3* 2.0* 2.0* 1.9* 1.8*   No results for input(s): LIPASE, AMYLASE in the last 168 hours. Recent Labs  Lab 01/27/24 2343  AMMONIA 30    ABG    Component Value Date/Time   PHART 7.470 (H) 01/29/2024 1501   PCO2ART 25.6 (L) 01/29/2024 1501   PO2ART 72 (L) 01/29/2024 1501   HCO3 18.7 (L) 01/29/2024 1501   TCO2 19 (L) 01/29/2024 1501   ACIDBASEDEF 4.0 (H)  01/29/2024 1501   O2SAT 96 01/29/2024 1501     Coagulation Profile: Recent Labs  Lab 01/27/24 1723 01/28/24 0410  INR 1.9* 1.9*    Cardiac Enzymes: Recent Labs  Lab 01/28/24 0409 01/29/24 0649  CKTOTAL 98 68    HbA1C: Hgb A1c MFr Bld  Date/Time Value Ref Range Status  01/28/2024 04:09 AM 6.4 (H) 4.8 - 5.6 % Final    Comment:    (NOTE) Diagnosis of Diabetes The following HbA1c ranges recommended by the American Diabetes Association (ADA) may be used as an aid in the diagnosis of diabetes mellitus.  Hemoglobin             Suggested A1C NGSP%              Diagnosis  <5.7                   Non Diabetic  5.7-6.4                Pre-Diabetic  >6.4                   Diabetic  <7.0                   Glycemic control for  adults with diabetes.      CBG: Recent Labs  Lab 01/31/24 1948 01/31/24 2316 02/01/24 0317 02/01/24 0323 02/01/24 0746  GLUCAP 155* 190* 253* 241* 273*    Review of Systems:   Not obtained.  Past Medical History:  She,  has a past medical history of Benign positional vertigo (03/04/2016), Brain injury (HCC), CHF (congestive heart failure) (HCC), Climacteric, Depression, DJD (degenerative joint disease), Dyslipidemia, GERD (gastroesophageal reflux disease), Hypertension, Hypokalemia, Hypothyroidism, IBS (irritable bowel syndrome), OAB (overactive bladder), Ovarian cyst (03/25/2015), Rhinitis, Sleep apnea, and Spasm of thoracolumbar muscle.   Surgical History:   Past Surgical History:  Procedure Laterality Date   ABDOMINAL HYSTERECTOMY  07/12/2014   ANKLE RECONSTRUCTION Left    BRACHIOPLASTY     BREAST REDUCTION SURGERY Bilateral 2020   CATARACT EXTRACTION     CHOLECYSTECTOMY     COLONOSCOPY WITH PROPOFOL  N/A 12/07/2016   Procedure: COLONOSCOPY WITH PROPOFOL ;  Surgeon: Viktoria Lamar DASEN, MD;  Location: [facility] ENDOSCOPY;  Service: Endoscopy;  Laterality: N/A;   JOINT REPLACEMENT Bilateral 07/2014, 03/2015   nasal  reconstrucion     REDUCTION MAMMAPLASTY Bilateral    SHOULDER OPEN ROTATOR CUFF REPAIR     SOFT TISSUE TUMOR RESECTION     excision tumor soft tissue neck, and soft tissue subcutaneous axilla   TOE SURGERY Right    reconstruction right foot 4th and 5th toe     Social History:   reports that she has never smoked. She has never used smokeless tobacco. She reports current alcohol  use of about 2.0 standard drinks of alcohol  per week. She reports that she does not use drugs.   Family History:  Her family history includes Breast cancer in her paternal aunt; Lung cancer in her father and mother.   Allergies Allergies  Allergen Reactions   Codeine Other (See Comments)    Seizure. Tolerates tramadol    Povidone Iodine Shortness Of Breath    Patient had difficulty breathing during procedure where dura prep was used to clean back.    Hydromorphone Hives, Itching and Other (See Comments)    Severe itching hallucinations   Latex Itching and Other (See Comments)    blisters   Oxycodone-Acetaminophen  Nausea And Vomiting and Itching   Sulfa Antibiotics Itching and Swelling    Swelling eyes, hands, feet. Was at home and didn't go to the hospital   Bupropion Other (See Comments)    Unknown reaction   Gabapentin Other (See Comments)    tremors   Hydrocodone     Tolerates tramadol    Morphine  And Codeine Itching and Rash   Procaine      Home Medications  Prior to Admission medications   Medication Sig Start Date End Date Taking? Authorizing Provider  acetaminophen  (TYLENOL ) 325 MG tablet Take 650 mg by mouth every 6 (six) hours as needed for mild pain (pain score 1-3).   Yes [provider]  Amantadine  HCl 100 MG tablet Take 100 mg by mouth 2 (two) times daily. 03/21/23  Yes [provider]  amLODipine  (NORVASC ) 5 MG tablet Take 5 mg by mouth daily.   Yes [provider]  Apixaban (ELIQUIS DVT/PE STARTER PACK PO) Take 5 mg by mouth in the morning and at bedtime.    Yes [provider]  ascorbic acid  (VITAMIN C ) 500 MG tablet Take 500 mg by mouth daily.   Yes [provider]  baclofen  (LIORESAL ) 20 MG tablet Take 20 mg by mouth daily as needed for muscle spasms.  Yes [provider]  bumetanide (BUMEX) 1 MG tablet Take 1 mg by mouth 2 (two) times daily.   Yes [provider]  carvedilol  (COREG ) 25 MG tablet Take 50 mg by mouth 2 (two) times daily with a meal.   Yes [provider]  dexamethasone  (DECADRON ) 4 MG tablet Take 6 mg by mouth daily.   Yes [provider]  donepezil  (ARICEPT ) 10 MG tablet Take 10 mg by mouth daily.   Yes [provider]  ferrous sulfate 325 (65 FE) MG tablet Take 325 mg by mouth daily with breakfast.   Yes [provider]  fluconazole (DIFLUCAN) 100 MG tablet Take 100 mg by mouth daily. For 7 days   Yes [provider]  furosemide  (LASIX ) 20 MG tablet Take 20 mg by mouth every morning.   Yes [provider]  hydrOXYzine (ATARAX) 25 MG tablet Take 25 mg by mouth every 6 (six) hours as needed for itching. 10/18/23  Yes [provider]  ipratropium-albuterol  (DUONEB) 0.5-2.5 (3) MG/3ML SOLN Take 3 mLs by nebulization every 4 (four) hours as needed (for congestion/wheezing).   Yes [provider]  levofloxacin (LEVAQUIN) 750 MG tablet Take 750 mg by mouth daily. For 14 days   Yes [provider]  levothyroxine  (SYNTHROID , LEVOTHROID) 112 MCG tablet Take 112 mcg by mouth daily.   Yes [provider]  losartan  (COZAAR ) 100 MG tablet Take 100 mg by mouth daily.   Yes [provider]  montelukast  (SINGULAIR ) 10 MG tablet Take 10 mg by mouth every evening.   Yes [provider]  nystatin  (MYCOSTATIN ) 100000 UNIT/ML suspension Take 5 mLs by mouth in the morning, at noon, and at bedtime. For 10 days   Yes [provider]  nystatin  (MYCOSTATIN /NYSTOP ) powder Apply topically 2 (two) times  daily. Patient taking differently: Apply 1 Application topically 2 (two) times daily as needed. 07/06/20  Yes Von Bellis, MD  omeprazole (PRILOSEC) 20 MG capsule Take 20 mg by mouth daily.   Yes [provider]  saccharomyces boulardii (FLORASTOR) 250 MG capsule Take 250 mg by mouth daily.   Yes [provider]  spironolactone  (ALDACTONE ) 25 MG tablet Take 25 mg by mouth daily. 10/15/22  Yes [provider]  topiramate  (TOPAMAX ) 25 MG tablet Take 25 mg by mouth daily.   Yes [provider]  traZODone  (DESYREL ) 50 MG tablet Take 50 mg by mouth at bedtime.   Yes [provider]  venlafaxine  XR (EFFEXOR -XR) 75 MG 24 hr capsule Take 75 mg by mouth daily with breakfast. 10/17/22  Yes [provider]           The patient is critically ill due to life threatening shock on vasopressors to maintain her blood pressure. AMS with poor mentation and concern for airway protection.  Critical care was necessary to treat or prevent imminent or life-threatening deterioration including frequent assessments of mentation to assess ability to protect airway. Critical care time was spent by me on the following activities: development of a treatment plan with the patient and/or surrogate as well as nursing, evaluation of the patient's response to treatment, examination of the patient, obtaining a history from the patient or surrogate, ordering and performing treatments and interventions, ordering and review of laboratory studies, ordering and review of radiographic studies, review of telemetry data including pulse oximetry, re-evaluation of patient's condition and participation in multidisciplinary rounds.   I personally spent 32 minutes providing critical care not including any separately  billable procedures.   Zola LOISE Herter, MD [facility] 02/01/2024 7:50 AM

## 2024-02-01 NOTE — Progress Notes (Signed)
 NAME:  [name], MRN:  [id number], DOB:  [birth year], LOS: 4 ADMISSION DATE:  01/27/2024, CONSULTATION DATE:  01/30/2024 REFERRING MD:  MELODIE, CHIEF COMPLAINT:  uremic encephalopathy   History of Present Illness:  [name] is a 78 y/o F with a PMH significant for chronic diastolic heart failure, DVT on Eliquis, suspected OHS, morbid obesity, and chronic hypoxic respiratory failure on 3 L O2 by [place] who presented to [facility] for hematuria and rectal bleeding with hospital course c/b acute renal failure, uremic encephalopathy requiring CRRT, now severely hypotensive in the setting of possible sepsis requiring Levo .  Pertinent  Medical History  As above  Significant Hospital Events: Including procedures, antibiotic start and stop dates in addition to other pertinent events   Received temp dialysis line After which had an iatrogenic PTX CRRT started  Interim History / Subjective:  Pt had a stool impaction. After she was disimpacted and had a bowel movement, she suddenly became hypotensive, with SBP 70s requiring Levophed . His 6L of HFNC was however weaned off to RA.   Objective    Blood pressure (!) 121/47, pulse 79, temperature 99.3 F (37.4 C), temperature source Axillary, resp. rate 15, height 5' 6 (1.676 m), weight 131.1 kg, SpO2 98%.    FiO2 (%):  [30 %] 30 %   Intake/Output Summary (Last 24 hours) at 02/01/2024 0859 Last data filed at 02/01/2024 0600 Gross per 24 hour  Intake 867.96 ml  Output -1 ml  Net 868.96 ml   Filed Weights   01/30/24 0500 01/31/24 0145 02/01/24 0530  Weight: 133.5 kg 128.6 kg 131.1 kg    Examination: General: Chronically ill-appearing woman, laying in bed, HENT: anicteric sclera, well injected conjunctivae, PERRL, dry oral mucosa Lungs: slightly rhonchorous Cardiovascular: Distant heart sounds Abdomen: soft, slightly tender to palpation throughout the upper and lower abdomen Extremities: 1+ pitting edema upper and lower  extremities, bruising bilaterally in upper extremities Neuro: GCS 9, moans intermittently, does not follow commands   Resolved problem list   Assessment and Plan  Neuro:  #Acute Metabolic Encephalopathy likely due to sepsis vs uremia  - Mildly improved - Ensuring delirium precautions  - Will continue to monitor  Cardiovascular:  #Borderline Hypotension: - Her blood pressure dropped with them up as low as 60 and required 20 Levophed  overnight. - Will continue to wean off Levophed  as tolerated today - She spiked a low-grade fever 100.4 last night but has been afebrile since - Will get a chest x-ray, follow-up with blood cultures and assess for any signs of infection - VBG - RUQ U/S - Start Vanc and Zosyn  empirically until blood cultures are back     Pulmonary:  #Acute on Chronic Hypoxic Respiratory Failure: #Iatrogenic Pneumothorax: due to difficult central line placement - Acute hypoxic respiratory failure seems to have resolved - Patient is currently on room air - Will continue to monitor and supplement with oxygen as needed  ID:  #Concern for UTI: UA +, Urine culture with yeast only - Blood Cx no growth till date  - Last day for ceftriaxone  today    GI:  #Concern for UGIB  - PPI BID - Serial CBCs   #Nutrition: Tube feeds   GU/Renal:  #AKI on CKD stage 3b: #Electrolytes replacement via dialysis  #Uremic encephalopathy - Renally adjust medications - Avoid Nephrotoxins - Strict I/Os - Foley catheter - Appreciate nephrology consult - Continue CRRT for now, may consider iHD given normotension     Heme/Onc:  #Hx  of DVT: heparin  subQ TID    Endocrine:  #DM: - Moderate SSI  - Hold tube feeds  - CGM  MSK: - PT/OT   Disposition: ICU appropriate , requiring Levo for pressure support  Best Practice (right click and Reselect all SmartList Selections daily)   Diet/type: Cortrak in place for tube feeds DVT prophylaxis prophylactic heparin   Pressure  ulcer(s): N/A GI prophylaxis: PPI Lines: Dialysis Catheter Foley:  Yes, and it is still needed Code Status:  DNR Last date of multidisciplinary goals of care discussion [Not yet]  Labs   CBC: Recent Labs  Lab 01/27/24 1723 01/27/24 1750 01/28/24 0409 01/28/24 0410 01/29/24 1501 01/29/24 1617 01/29/24 2309 01/30/24 0410 01/31/24 0253  WBC 22.2*  --  21.7*  --   --  19.9* 19.2* 20.5* 19.9*  NEUTROABS 20.3*  --  19.2*  --   --  17.3* 16.2*  --   --   HGB 11.6*   < > 10.6*  --  7.8* 8.2* 8.2* 8.4* 7.8*  HCT 33.6*   < > 32.1*  --  23.0* 24.8* 23.4* 24.4* 23.5*  MCV 92.1  --  95.8  --   --  94.7 91.1 91.7 96.3  PLT 204  --  194 188  --  99* 104* 97* 66*   < > = values in this interval not displayed.    Basic Metabolic Panel: Recent Labs  Lab 01/28/24 0409 01/29/24 0649 01/29/24 1501 01/29/24 1617 01/30/24 0410 01/30/24 1809 01/31/24 0253 02/01/24 0300  NA 134* 136 135 136 135 136 136  --   K 4.0 4.3 3.2* 3.1* 3.3* 4.4 4.2  --   CL 99 107  --  104 101 100 103  --   CO2 19* 12*  --  21* 23 25 25   --   GLUCOSE 240* 130*  --  177* 174* 166* 131*  --   BUN 129* 134*  --  108* 34* 11 6*  --   CREATININE 3.27* 3.54*  --  2.87* 1.32* 0.81 0.63  --   CALCIUM 9.8 9.4  --  8.4* 8.0* 7.6* 7.9*  --   MG 2.1  --   --   --  2.0  --  2.1 2.2  PHOS 5.2* 4.8*  --  4.1 1.0* 3.4 1.9* 2.2*   GFR: Estimated Creatinine Clearance: 80.5 mL/min (by C-G formula based on SCr of 0.63 mg/dL). Recent Labs  Lab 01/27/24 1631 01/27/24 1723 01/27/24 1845 01/28/24 0409 01/29/24 1617 01/29/24 2309 01/30/24 0410 01/31/24 0253  WBC  --    < >  --    < > 19.9* 19.2* 20.5* 19.9*  LATICACIDVEN 1.7  --  1.3  --   --   --   --   --    < > = values in this interval not displayed.    Liver Function Tests: Recent Labs  Lab 01/27/24 1723 01/28/24 0409 01/29/24 0649 01/29/24 1617 01/30/24 0410 01/30/24 1809 01/31/24 0253  AST 26 21  --   --   --   --   --   ALT 17 15  --   --   --   --   --    ALKPHOS 43 42  --   --   --   --   --   BILITOT 0.9 1.1  --   --   --   --   --   PROT 6.3* 5.8*  --   --   --   --   --  ALBUMIN  3.3* 3.0* 2.3* 2.0* 2.0* 1.9* 1.8*   No results for input(s): LIPASE, AMYLASE in the last 168 hours. Recent Labs  Lab 01/27/24 2343  AMMONIA 30    ABG    Component Value Date/Time   PHART 7.470 (H) 01/29/2024 1501   PCO2ART 25.6 (L) 01/29/2024 1501   PO2ART 72 (L) 01/29/2024 1501   HCO3 18.7 (L) 01/29/2024 1501   TCO2 19 (L) 01/29/2024 1501   ACIDBASEDEF 4.0 (H) 01/29/2024 1501   O2SAT 96 01/29/2024 1501     Coagulation Profile: Recent Labs  Lab 01/27/24 1723 01/28/24 0410  INR 1.9* 1.9*    Cardiac Enzymes: Recent Labs  Lab 01/28/24 0409 01/29/24 0649  CKTOTAL 98 68    HbA1C: Hgb A1c MFr Bld  Date/Time Value Ref Range Status  01/28/2024 04:09 AM 6.4 (H) 4.8 - 5.6 % Final    Comment:    (NOTE) Diagnosis of Diabetes The following HbA1c ranges recommended by the American Diabetes Association (ADA) may be used as an aid in the diagnosis of diabetes mellitus.  Hemoglobin             Suggested A1C NGSP%              Diagnosis  <5.7                   Non Diabetic  5.7-6.4                Pre-Diabetic  >6.4                   Diabetic  <7.0                   Glycemic control for                       adults with diabetes.      CBG: Recent Labs  Lab 01/31/24 1948 01/31/24 2316 02/01/24 0317 02/01/24 0323 02/01/24 0746  GLUCAP 155* 190* 253* 241* 273*    Review of Systems:   Not obtained.  Past Medical History:  She,  has a past medical history of Benign positional vertigo (03/04/2016), Brain injury (HCC), CHF (congestive heart failure) (HCC), Climacteric, Depression, DJD (degenerative joint disease), Dyslipidemia, GERD (gastroesophageal reflux disease), Hypertension, Hypokalemia, Hypothyroidism, IBS (irritable bowel syndrome), OAB (overactive bladder), Ovarian cyst (03/25/2015), Rhinitis, Sleep apnea, and Spasm  of thoracolumbar muscle.   Surgical History:   Past Surgical History:  Procedure Laterality Date   ABDOMINAL HYSTERECTOMY  07/12/2014   ANKLE RECONSTRUCTION Left    BRACHIOPLASTY     BREAST REDUCTION SURGERY Bilateral 2020   CATARACT EXTRACTION     CHOLECYSTECTOMY     COLONOSCOPY WITH PROPOFOL  N/A 12/07/2016   Procedure: COLONOSCOPY WITH PROPOFOL ;  Surgeon: Viktoria Lamar DASEN, MD;  Location: [facility] ENDOSCOPY;  Service: Endoscopy;  Laterality: N/A;   JOINT REPLACEMENT Bilateral 07/2014, 03/2015   nasal reconstrucion     REDUCTION MAMMAPLASTY Bilateral    SHOULDER OPEN ROTATOR CUFF REPAIR     SOFT TISSUE TUMOR RESECTION     excision tumor soft tissue neck, and soft tissue subcutaneous axilla   TOE SURGERY Right    reconstruction right foot 4th and 5th toe     Social History:   reports that she has never smoked. She has never used smokeless tobacco. She reports current alcohol  use of about 2.0 standard drinks of alcohol  per week. She reports that she does not use drugs.  Family History:  Her family history includes Breast cancer in her paternal aunt; Lung cancer in her father and mother.   Allergies Allergies  Allergen Reactions   Codeine Other (See Comments)    Seizure. Tolerates tramadol    Povidone Iodine Shortness Of Breath    Patient had difficulty breathing during procedure where dura prep was used to clean back.    Hydromorphone Hives, Itching and Other (See Comments)    Severe itching hallucinations   Latex Itching and Other (See Comments)    blisters   Oxycodone-Acetaminophen  Nausea And Vomiting and Itching   Sulfa Antibiotics Itching and Swelling    Swelling eyes, hands, feet. Was at home and didn't go to the hospital   Bupropion Other (See Comments)    Unknown reaction   Gabapentin Other (See Comments)    tremors   Hydrocodone     Tolerates tramadol    Morphine  And Codeine Itching and Rash   Procaine      Home Medications  Prior to Admission medications    Medication Sig Start Date End Date Taking? Authorizing Provider  acetaminophen  (TYLENOL ) 325 MG tablet Take 650 mg by mouth every 6 (six) hours as needed for mild pain (pain score 1-3).   Yes [provider]  Amantadine  HCl 100 MG tablet Take 100 mg by mouth 2 (two) times daily. 03/21/23  Yes [provider]  amLODipine  (NORVASC ) 5 MG tablet Take 5 mg by mouth daily.   Yes [provider]  Apixaban (ELIQUIS DVT/PE STARTER PACK PO) Take 5 mg by mouth in the morning and at bedtime.   Yes [provider]  ascorbic acid  (VITAMIN C ) 500 MG tablet Take 500 mg by mouth daily.   Yes [provider]  baclofen  (LIORESAL ) 20 MG tablet Take 20 mg by mouth daily as needed for muscle spasms.   Yes [provider]  bumetanide (BUMEX) 1 MG tablet Take 1 mg by mouth 2 (two) times daily.   Yes [provider]  carvedilol  (COREG ) 25 MG tablet Take 50 mg by mouth 2 (two) times daily with a meal.   Yes [provider]  dexamethasone  (DECADRON ) 4 MG tablet Take 6 mg by mouth daily.   Yes [provider]  donepezil  (ARICEPT ) 10 MG tablet Take 10 mg by mouth daily.   Yes [provider]  ferrous sulfate 325 (65 FE) MG tablet Take 325 mg by mouth daily with breakfast.   Yes [provider]  fluconazole (DIFLUCAN) 100 MG tablet Take 100 mg by mouth daily. For 7 days   Yes [provider]  furosemide  (LASIX ) 20 MG tablet Take 20 mg by mouth every morning.   Yes [provider]  hydrOXYzine (ATARAX) 25 MG tablet Take 25 mg by mouth every 6 (six) hours as needed for itching. 10/18/23  Yes [provider]  ipratropium-albuterol  (DUONEB) 0.5-2.5 (3) MG/3ML SOLN Take 3 mLs by nebulization every 4 (four) hours as needed (for congestion/wheezing).   Yes [provider]  levofloxacin (LEVAQUIN) 750 MG tablet Take 750 mg by mouth daily. For 14 days   Yes [provider]  levothyroxine   (SYNTHROID , LEVOTHROID) 112 MCG tablet Take 112 mcg by mouth daily.   Yes [provider]  losartan  (COZAAR ) 100 MG tablet Take 100 mg by mouth daily.   Yes [provider]  montelukast  (SINGULAIR ) 10 MG tablet Take 10 mg by mouth every evening.   Yes [provider]  nystatin  (MYCOSTATIN ) 100000 UNIT/ML suspension  Take 5 mLs by mouth in the morning, at noon, and at bedtime. For 10 days   Yes [provider]  nystatin  (MYCOSTATIN /NYSTOP ) powder Apply topically 2 (two) times daily. Patient taking differently: Apply 1 Application topically 2 (two) times daily as needed. 07/06/20  Yes Von Bellis, MD  omeprazole (PRILOSEC) 20 MG capsule Take 20 mg by mouth daily.   Yes [provider]  saccharomyces boulardii (FLORASTOR) 250 MG capsule Take 250 mg by mouth daily.   Yes [provider]  spironolactone  (ALDACTONE ) 25 MG tablet Take 25 mg by mouth daily. 10/15/22  Yes [provider]  topiramate  (TOPAMAX ) 25 MG tablet Take 25 mg by mouth daily.   Yes [provider]  traZODone  (DESYREL ) 50 MG tablet Take 50 mg by mouth at bedtime.   Yes [provider]  venlafaxine  XR (EFFEXOR -XR) 75 MG 24 hr capsule Take 75 mg by mouth daily with breakfast. 10/17/22  Yes [provider]     Drue Lisa Grow MD 02/01/2024, 8:59 AM

## 2024-02-01 NOTE — Progress Notes (Signed)
 PT Cancellation Note  Patient Details Name: [name] MRN: [id number] DOB: [birth year]   Cancelled Treatment:    Reason Eval/Treat Not Completed: Medical issues which prohibited therapy; per RN pt not able to participate and with very thin skin breaks each time she is touched.  Will sign off due to multiple cancels.  Please consult again if pt able to participate.   [name] 02/01/2024, 11:30 AM [name], PT Acute Rehabilitation Services Office:[phone] 02/01/2024

## 2024-02-01 NOTE — Plan of Care (Signed)
  Problem: Nutritional: Goal: Maintenance of adequate nutrition will improve Outcome: Progressing   Problem: Skin Integrity: Goal: Risk for impaired skin integrity will decrease Outcome: Progressing   Problem: Safety: Goal: Ability to remain free from injury will improve Outcome: Progressing

## 2024-02-01 NOTE — Plan of Care (Signed)
  Problem: Fluid Volume: Goal: Ability to maintain a balanced intake and output will improve Outcome: Not Progressing   Problem: Metabolic: Goal: Ability to maintain appropriate glucose levels will improve Outcome: Not Progressing   Problem: Nutritional: Goal: Maintenance of adequate nutrition will improve Outcome: Not Progressing

## 2024-02-01 NOTE — Progress Notes (Signed)
 [facility] Progress Note   Assessment/ Plan:    AKI on CKD 3b - baseline Cr 1.3 and creatinine rose to around 3.3 with elevated BUN - startted CRRT 8/16 - no real improvement in  MS after CRRT initiation - Stopped CRRT on 8/18-> given mental status has not significantly improved I do not think she is a good candidate for ongoing renal replacement therapy.  Particularly giving worsened hypotension at this time unable to tolerate dialysis.  Recommend palliative care consult and clarifying goals of care  2.  Metabolic acidosis:  - Resolved with CRRT  3 Acute encephalopathy:  - Likely multifactorial.  No improvement with dialysis.  Management per primary team and neurology    4 possible GIB  - Holding anticoagulation.  Further workup per primary team  5.  Acute hypxic RF: With iatrogenic pneumothorax  - on HHFC  6.  Possible UTI: Antibiotics and workup per primary team  7.  Dispo: in ICU. Recommend GOC.  Palliative consulted  Subjective:    Stopped CRRT.  Answer some questions today but minimally conversant.  Worsening hypotension overnight requiring norepinephrine  up to 15 mcg.  I do not see any conversations regarding GOC   Objective:   BP (!) 121/47   Pulse 79   Temp 99.3 F (37.4 C) (Axillary)   Resp 15   Ht 5' 6 (1.676 m)   Wt 131.1 kg   SpO2 98%   BMI 46.65 kg/m   Intake/Output Summary (Last 24 hours) at 02/01/2024 9071 Last data filed at 02/01/2024 0600 Gross per 24 hour  Intake 867.96 ml  Output --  Net 867.96 ml   Weight change: 2.5 kg  Physical Exam: Gen:ill-appearing, on HHFNC CVS: Normal rate, no rub Resp: Bilateral chest rise, no increased work of breathing Abd: large, + pannus, nontender Ext: Upper extremity and lower extremity edema present, warm and well-perfused Skin: extensive bruising on Upper extremities  Imaging: DG CHEST PORT 1 VIEW Result Date: 02/01/2024 CLINICAL DATA:  355160 Hypotension 355160. EXAM: PORTABLE CHEST 1  VIEW COMPARISON:  01/31/2024. FINDINGS: Low lung volume. Elevated right hemidiaphragm noted. There are mild-to-moderately increased vascular markings, likely accentuated by low lung volume. No frank pulmonary edema. Bilateral lung fields are otherwise clear. No acute consolidation or lung collapse. Bilateral costophrenic angles are clear. Stable cardio-mediastinal silhouette. No acute osseous abnormalities. The soft tissues are within normal limits. Right IJ central venous catheter noted with its tip overlying the midportion of superior vena cava. Enteric tube is seen coursing below the left hemidiaphragm however, its tip and side hole are not included in the view. IMPRESSION: Low lung volume. Otherwise no acute cardiopulmonary abnormality. Electronically Signed   By: Ree Molt M.D.   On: 02/01/2024 08:10   DG CHEST PORT 1 VIEW Result Date: 01/31/2024 EXAM: 1 VIEW XRAY OF THE CHEST 01/31/2024 08:36:00 AM COMPARISON: 01/30/2024 CLINICAL HISTORY: Pneumothorax. FINDINGS: LUNGS AND PLEURA: No focal pulmonary opacity. No pulmonary edema. No pleural effusion. No pneumothorax. HEART AND MEDIASTINUM: Mild cardiomegaly. No congestive heart failure. BONES AND SOFT TISSUES: No acute osseous abnormality. LINES AND TUBES: Right internal jugular line tip at low SVC . LIMITATIONS/ARTIFACTS: Limited exam secondary to patient body habitus and AP portable technique. Patient rotated to the right. IMPRESSION: 1. No pneumothorax. 2. Mild cardiomegaly. No congestive heart failure. Electronically signed by: Rockey Kilts MD 01/31/2024 10:54 AM EDT RP Workstation: [id number]   EEG adult Result Date: 01/31/2024 Shelton Arlin KIDD, MD     01/31/2024  8:51 AM  Patient Name: [name] MRN: [id number] Epilepsy Attending: Arlin MALVA Krebs Referring Physician/Provider: Catherine Cools, MD Date: 01/30/2024 Duration: 23.42 mins Patient history: 78yo F with ams. EEG to evaluate for seizure Level of alertness: Awake/ lethargic  AEDs during EEG study: None Technical aspects: This EEG study was done with scalp electrodes positioned according to the 10-20 International system of electrode placement. Electrical activity was reviewed with band pass filter of 1-70Hz , sensitivity of 7 uV/mm, display speed of 80mm/sec with a 60Hz  notched filter applied as appropriate. EEG data were recorded continuously and digitally stored.  Video monitoring was available and reviewed as appropriate. Description: EEG showed continuous generalized 5 to 9 Hz theta-alpha activity admixed with intermittent 2-3hz  delta slowing. Hyperventilation and photic stimulation were not performed.   ABNORMALITY - Continuous slow, generalized IMPRESSION: This study is suggestive of moderate diffuse encephalopathy. No seizures or epileptiform discharges were seen throughout the recording. Priyanka MALVA Krebs   DG CHEST PORT 1 VIEW Result Date: 01/30/2024 CLINICAL DATA:  Evaluate pneumothorax EXAM: PORTABLE CHEST 1 VIEW COMPARISON:  Same day radiograph at 10:01 a.m. FINDINGS: Right IJ CVC tip in the mid SVC. Stable cardiomediastinal silhouette. Low lung volumes accentuate pulmonary vascularity. No evidence of pneumothorax. Similar right upper lobe atelectasis. Improving opacification in the left lung base, favor confluence of shadows. No definite pleural effusion. IMPRESSION: No evidence of pneumothorax. Electronically Signed   By: Norman Gatlin M.D.   On: 01/30/2024 23:21   DG CHEST PORT 1 VIEW Result Date: 01/30/2024 CLINICAL DATA:  Pneumothorax. EXAM: PORTABLE CHEST 1 VIEW COMPARISON:  Radiograph earlier today. FINDINGS: Stable small right apical pneumothorax. Right-sided central line remains in place. Low lung volumes persist. Stable heart size and mediastinal contours. Right upper lobe atelectasis is similar. Question of increasing left lung base atelectasis versus soft tissue attenuation. No significant pleural effusion. IMPRESSION: 1. Stable small right apical  pneumothorax. 2. Right upper lobe atelectasis is similar. 3. Question of increasing left lung base atelectasis versus soft tissue attenuation. Electronically Signed   By: Andrea Gasman M.D.   On: 01/30/2024 12:18    Labs: BMET Recent Labs  Lab 01/27/24 1723 01/27/24 1750 01/28/24 0409 01/29/24 0649 01/29/24 1501 01/29/24 1617 01/30/24 0410 01/30/24 1809 01/31/24 0253 02/01/24 0300  NA 131*   < > 134* 136 135 136 135 136 136  --   K 4.3   < > 4.0 4.3 3.2* 3.1* 3.3* 4.4 4.2  --   CL 95*  --  99 107  --  104 101 100 103  --   CO2 21*  --  19* 12*  --  21* 23 25 25   --   GLUCOSE 241*  --  240* 130*  --  177* 174* 166* 131*  --   BUN 127*  --  129* 134*  --  108* 34* 11 6*  --   CREATININE 3.09*  --  3.27* 3.54*  --  2.87* 1.32* 0.81 0.63  --   CALCIUM 10.3  --  9.8 9.4  --  8.4* 8.0* 7.6* 7.9*  --   PHOS  --   --  5.2* 4.8*  --  4.1 1.0* 3.4 1.9* 2.2*   < > = values in this interval not displayed.   CBC Recent Labs  Lab 01/27/24 1723 01/27/24 1750 01/28/24 0409 01/28/24 0410 01/29/24 1617 01/29/24 2309 01/30/24 0410 01/31/24 0253  WBC 22.2*  --  21.7*  --  19.9* 19.2* 20.5* 19.9*  NEUTROABS 20.3*  --  19.2*  --  17.3* 16.2*  --   --   HGB 11.6*   < > 10.6*   < > 8.2* 8.2* 8.4* 7.8*  HCT 33.6*   < > 32.1*   < > 24.8* 23.4* 24.4* 23.5*  MCV 92.1  --  95.8  --  94.7 91.1 91.7 96.3  PLT 204  --  194   < > 99* 104* 97* 66*   < > = values in this interval not displayed.    Medications:     Chlorhexidine  Gluconate Cloth  6 each Topical Daily   feeding supplement (PROSource TF20)  60 mL Per Tube BID   Gerhardt's butt cream   Topical TID   heparin  injection (subcutaneous)  5,000 Units Subcutaneous Q8H   insulin  aspart  0-15 Units Subcutaneous Q4H   mupirocin  ointment   Nasal BID   nutrition supplement (JUVEN)  1 packet Per Tube BID BM   pantoprazole  (PROTONIX ) IV  40 mg Intravenous Q12H   thiamine  (VITAMIN B1) injection  100 mg Intravenous Daily   Jayson JINNY Player  02/01/2024, 9:28 AM

## 2024-02-02 DIAGNOSIS — Z515 Encounter for palliative care: Secondary | ICD-10-CM | POA: Diagnosis not present

## 2024-02-02 DIAGNOSIS — Z7189 Other specified counseling: Secondary | ICD-10-CM | POA: Diagnosis not present

## 2024-02-02 DIAGNOSIS — N170 Acute kidney failure with tubular necrosis: Secondary | ICD-10-CM | POA: Diagnosis not present

## 2024-02-02 LAB — CULTURE, BLOOD (ROUTINE X 2)
Culture: NO GROWTH
Culture: NO GROWTH

## 2024-02-02 NOTE — Plan of Care (Signed)

## 2024-02-02 NOTE — Consult Note (Addendum)
 Palliative Medicine Inpatient Consult Note  Consulting Provider: Macel Jayson PARAS, MD   Reason for consult:   Palliative Care Consult Services Palliative Medicine Consult  Reason for Consult? Clarify goals of care   02/02/2024  HPI:  Per intake H&P -->  [name] is a 78 y/o F with a PMH significant for chronic diastolic heart failure, DVT on Eliquis, suspected OHS, morbid obesity, and chronic hypoxic respiratory failure on 3 L O2 by [place] who presented to [facility] for hematuria and rectal bleeding with hospital course c/b acute renal failure. Family meeting held on 8/19 and goals are geared towards comfort. The PMT is involved to offer ongoing support of symptoms.   Clinical Assessment/Goals of Care:  *Please note that this is a verbal dictation therefore any spelling or grammatical errors are due to the Dragon Medical One system interpretation.  I have reviewed medical records including EPIC notes, labs and imaging, received report from bedside RN, assessed the patient.    I called and spoke with Senna's step-daughter, [name] to further discuss diagnosis prognosis, GOC, EOL wishes, disposition and options.   I introduced Palliative Medicine as specialized medical care for people living with serious illness. It focuses on providing relief from the symptoms and stress of a serious illness. The goal is to improve quality of life for both the patient and the family.  Medical History Review and Understanding:  A review of [name]'s past medical history inclusive of heart failure, DVT, obesity, respiratory failure on 3LPM [name], and  OHA.  Code Status:  Concepts specific to code status, artifical feeding and hydration, continued IV antibiotics and rehospitalization was had.  The difference between a aggressive medical intervention path  and a palliative comfort care path for this patient at this time was had.   Patient is an established DNAR/DNI code status.   Discussion:  WE reviewed  that [name] has been a long term resident at [facility]. She transitioned to the hospital in the setting of rectal bleeding. She has had a complicated hospital stay in the setting of suspected sepsis. She transitioned to renal failure requiring CRRT. Decisions were made with the CCM yesterday to allow comfort for [name].   I spoke with patients step-daughter, [name] and confirmed the plan for comfort care. We talked about transition to comfort measures in house and what that would entail inclusive of medications to control pain, dyspnea, agitation, nausea, itching, and hiccups.    We discussed stopping all uneccessary measures such as cardiac monitoring, blood draws, needle sticks, and frequent vital signs. Utilized reflective listening throughout our time together.   Discussed the importance of continued conversation with family and their  medical providers regarding overall plan of care and treatment options, ensuring decisions are within the context of the patients values and GOCs.  Decision Maker: [name],[name] (Daughter): [phone] (Mobile)   SUMMARY OF RECOMMENDATIONS   DNAR/DNI  Comfort Care  DC NGT  Continue fentanyl  gtt with boluses  Additional comfort medications per [facility]  Unrestricted visitation  Anticipate in hospital death --> Family would like for patient to go to [facility] on [address]  Ongoing PMT support  Code Status/Advance Care Planning: DNAR/DNI  Palliative Prophylaxis:  Aspiration, Bowel Regimen, Delirium Protocol, Frequent Pain Assessment, Oral Care, Palliative Wound Care, and Turn Reposition  Additional Recommendations (Limitations, Scope, Preferences): Continue current care  Psycho-social/Spiritual:  Desire for further Chaplaincy support: Yes Additional Recommendations: Education on EOL planning   Prognosis: Hours to days  Discharge Planning:  [place]  Vitals:   02/01/24 1600 02/01/24 1941  BP:  (!) 103/51   Pulse: 69 72  Resp: 15 18  Temp:  (!) 97.5 F (36.4 C)  SpO2: 100% 98%    Intake/Output Summary (Last 24 hours) at 02/02/2024 0714 Last data filed at 02/02/2024 0657 Gross per 24 hour  Intake 845.78 ml  Output 30 ml  Net 815.78 ml   Last Weight  Most recent update: 02/02/2024  6:53 AM    Weight  133.9 kg (295 lb 3.1 oz)            LABS: CBC:    Component Value Date/Time   WBC 14.0 (H) 02/01/2024 0928   HGB 7.1 (L) 02/01/2024 0928   HCT 22.7 (L) 02/01/2024 0928   PLT 65 (L) 02/01/2024 0928   MCV 100.4 (H) 02/01/2024 0928   NEUTROABS 16.2 (H) 01/29/2024 2309   LYMPHSABS 1.4 01/29/2024 2309   MONOABS 0.8 01/29/2024 2309   EOSABS 0.3 01/29/2024 2309   BASOSABS 0.0 01/29/2024 2309   Comprehensive Metabolic Panel:    Component Value Date/Time   NA 134 (L) 02/01/2024 0300   K 3.8 02/01/2024 0300   CL 101 02/01/2024 0300   CO2 24 02/01/2024 0300   BUN 32 (H) 02/01/2024 0300   CREATININE 1.64 (H) 02/01/2024 0300   GLUCOSE 262 (H) 02/01/2024 0300   CALCIUM 8.3 (L) 02/01/2024 0300   AST 21 01/28/2024 0409   ALT 15 01/28/2024 0409   ALKPHOS 42 01/28/2024 0409   BILITOT 1.1 01/28/2024 0409   PROT 5.8 (L) 01/28/2024 0409   ALBUMIN  2.2 (L) 02/01/2024 0300   Gen:  Elderly chronically ill appearing D  HEENT: dry mucous membranes CV: Regular rate and rhythm PULM: On RA, breathing is even and nonlabored ABD: soft/nontender  EXT: Generalized edema  Neuro: Somnolent  PPS:10%     Billing based on MDM: High Time: 75 ______________________________________________________ [name] [place] Palliative Medicine Team Team Cell Phone: [phone] Please utilize secure chat with additional questions, if there is no response within 30 minutes please call the above phone number  Palliative Medicine Team providers are available by phone from 7am to 7pm daily and can be reached through the team cell phone.  Should this patient require assistance outside of these  hours, please call the patient's attending physician.

## 2024-02-02 NOTE — Progress Notes (Signed)
 PROGRESS NOTE    [name]  FMW:[id number] DOB: 06-04-1946 DOA: 01/27/2024 PCP: Lenon Layman ORN, MD   Brief Narrative:    [name] is a 78 y/o F with a PMH significant for chronic diastolic heart failure, DVT on Eliquis, suspected OHS, morbid obesity, and chronic hypoxic respiratory failure on 3 L O2 by [place] who presented to [facility] for hematuria and rectal bleeding with hospital course c/b acute renal failure, uremic encephalopathy, and hypotension requiring CRRT and ICU admission.  She was admitted for hematuria and rectal bleeding with hospital course complicated by acute renal failure and uremic encephalopathy requiring CRRT.  She was severely hypotensive in the setting of possible sepsis and required norepinephrine  for blood pressure support.  Her overall prognosis was quite poor and she was transitioned to comfort care and has anticipated survival time of 5 hours to days.  Assessment & Plan:   Principal Problem:   ARF (acute renal failure) (HCC) Active Problems:   Acquired hypothyroidism   Chronic diastolic CHF (congestive heart failure) (HCC)   Cognitive deficit as late effect of traumatic brain injury (HCC)   HTN, goal below 140/90   Morbid obesity with BMI of 40.0-44.9, adult (HCC)   OSA (obstructive sleep apnea)   Anemia   Leucocytosis   Acute encephalopathy   Bleeding   History of DVT (deep vein thrombosis)   Altered mental status   Uremic encephalopathy   Acute hypoxic respiratory failure (HCC)   Acute GI bleeding  Assessment and Plan:   Continue comfort care measures -Appreciate palliative care -Anticipate in-hospital death with prognosis of hours to days    Subjective: Patient seen and evaluated today and appears to be comfortable with no acute overnight events noted.  Objective: Vitals:   02/01/24 1600 02/01/24 1941 02/02/24 0500 02/02/24 0848  BP:  (!) 103/51  (!) 81/38  Pulse: 69 72  87  Resp: 15 18  (!) 4  Temp:  (!) 97.5 F  (36.4 C)  97.6 F (36.4 C)  TempSrc:      SpO2: 100% 98%  94%  Weight:   133.9 kg   Height:        Intake/Output Summary (Last 24 hours) at 02/02/2024 1605 Last data filed at 02/02/2024 0657 Gross per 24 hour  Intake 206.3 ml  Output 100 ml  Net 106.3 ml   Filed Weights   01/31/24 0145 02/01/24 0530 02/02/24 0500  Weight: 128.6 kg 131.1 kg 133.9 kg    Examination:  General exam: Appears calm and comfortable  Respiratory system: Clear to auscultation. Respiratory effort normal. Cardiovascular system: S1 & S2 heard, RRR.  Gastrointestinal system: Abdomen is soft Central nervous system: Unresponsive Extremities: No edema Skin: No significant lesions noted    Data Reviewed: I have personally reviewed following labs and imaging studies  CBC: Recent Labs  Lab 01/27/24 1723 01/27/24 1750 01/28/24 0409 01/28/24 0410 01/29/24 1617 01/29/24 2309 01/30/24 0410 01/31/24 0253 02/01/24 0928  WBC 22.2*  --  21.7*  --  19.9* 19.2* 20.5* 19.9* 14.0*  NEUTROABS 20.3*  --  19.2*  --  17.3* 16.2*  --   --   --   HGB 11.6*   < > 10.6*   < > 8.2* 8.2* 8.4* 7.8* 7.1*  HCT 33.6*   < > 32.1*   < > 24.8* 23.4* 24.4* 23.5* 22.7*  MCV 92.1  --  95.8  --  94.7 91.1 91.7 96.3 100.4*  PLT 204  --  194   < >  99* 104* 97* 66* 65*   < > = values in this interval not displayed.   Basic Metabolic Panel: Recent Labs  Lab 01/28/24 0409 01/29/24 0649 01/29/24 1617 01/30/24 0410 01/30/24 1809 01/31/24 0253 02/01/24 0300  NA 134*   < > 136 135 136 136 134*  K 4.0   < > 3.1* 3.3* 4.4 4.2 3.8  CL 99   < > 104 101 100 103 101  CO2 19*   < > 21* 23 25 25 24   GLUCOSE 240*   < > 177* 174* 166* 131* 262*  BUN 129*   < > 108* 34* 11 6* 32*  CREATININE 3.27*   < > 2.87* 1.32* 0.81 0.63 1.64*  CALCIUM 9.8   < > 8.4* 8.0* 7.6* 7.9* 8.3*  MG 2.1  --   --  2.0  --  2.1 2.2  PHOS 5.2*   < > 4.1 1.0* 3.4 1.9* 2.2*  2.2*   < > = values in this interval not displayed.   GFR: Estimated Creatinine  Clearance: 39.8 mL/min (A) (by C-G formula based on SCr of 1.64 mg/dL (H)). Liver Function Tests: Recent Labs  Lab 01/27/24 1723 01/28/24 0409 01/29/24 0649 01/29/24 1617 01/30/24 0410 01/30/24 1809 01/31/24 0253 02/01/24 0300  AST 26 21  --   --   --   --   --   --   ALT 17 15  --   --   --   --   --   --   ALKPHOS 43 42  --   --   --   --   --   --   BILITOT 0.9 1.1  --   --   --   --   --   --   PROT 6.3* 5.8*  --   --   --   --   --   --   ALBUMIN  3.3* 3.0*   < > 2.0* 2.0* 1.9* 1.8* 2.2*   < > = values in this interval not displayed.   No results for input(s): LIPASE, AMYLASE in the last 168 hours. Recent Labs  Lab 01/27/24 2343  AMMONIA 30   Coagulation Profile: Recent Labs  Lab 01/27/24 1723 01/28/24 0410  INR 1.9* 1.9*   Cardiac Enzymes: Recent Labs  Lab 01/28/24 0409 01/29/24 0649  CKTOTAL 98 68   BNP (last 3 results) No results for input(s): PROBNP in the last 8760 hours. HbA1C: No results for input(s): HGBA1C in the last 72 hours. CBG: Recent Labs  Lab 02/01/24 0317 02/01/24 0323 02/01/24 0746 02/01/24 1108 02/01/24 1516  GLUCAP 253* 241* 273* 211* 121*   Lipid Profile: No results for input(s): CHOL, HDL, LDLCALC, TRIG, CHOLHDL, LDLDIRECT in the last 72 hours. Thyroid Function Tests: No results for input(s): TSH, T4TOTAL, FREET4, T3FREE, THYROIDAB in the last 72 hours. Anemia Panel: No results for input(s): VITAMINB12, FOLATE, FERRITIN, TIBC, IRON, RETICCTPCT in the last 72 hours. Sepsis Labs: Recent Labs  Lab 01/27/24 1631 01/27/24 1845  LATICACIDVEN 1.7 1.3    Recent Results (from the past 240 hours)  Culture, blood (Routine X 2) w Reflex to ID Panel     Status: None   Collection Time: 01/27/24 11:30 PM   Specimen: BLOOD  Result Value Ref Range Status   Specimen Description BLOOD SITE NOT SPECIFIED  Final   Special Requests   Final    BOTTLES DRAWN AEROBIC AND ANAEROBIC Blood Culture  results may not be optimal due  to an inadequate volume of blood received in culture bottles   Culture   Final    NO GROWTH 5 DAYS Performed at [facility], [address] [place], [place] [address]    Report Status 02/02/2024 FINAL  Final  Culture, blood (Routine X 2) w Reflex to ID Panel     Status: None   Collection Time: 01/27/24 11:35 PM   Specimen: BLOOD  Result Value Ref Range Status   Specimen Description BLOOD SITE NOT SPECIFIED  Final   Special Requests   Final    BOTTLES DRAWN AEROBIC AND ANAEROBIC Blood Culture results may not be optimal due to an inadequate volume of blood received in culture bottles   Culture   Final    NO GROWTH 5 DAYS Performed at [facility], [address] [place], [place] [address]    Report Status 02/02/2024 FINAL  Final  Respiratory (~20 pathogens) panel by PCR     Status: None   Collection Time: 01/28/24  5:59 AM   Specimen: Nasopharyngeal Swab; Respiratory  Result Value Ref Range Status   Adenovirus NOT DETECTED NOT DETECTED Final   Coronavirus 229E NOT DETECTED NOT DETECTED Final    Comment: (NOTE) The Coronavirus on the Respiratory Panel, DOES NOT test for the novel  Coronavirus (2019 nCoV)    Coronavirus HKU1 NOT DETECTED NOT DETECTED Final   Coronavirus NL63 NOT DETECTED NOT DETECTED Final   Coronavirus OC43 NOT DETECTED NOT DETECTED Final   Metapneumovirus NOT DETECTED NOT DETECTED Final   Rhinovirus / Enterovirus NOT DETECTED NOT DETECTED Final   Influenza A NOT DETECTED NOT DETECTED Final   Influenza B NOT DETECTED NOT DETECTED Final   Parainfluenza Virus 1 NOT DETECTED NOT DETECTED Final   Parainfluenza Virus 2 NOT DETECTED NOT DETECTED Final   Parainfluenza Virus 3 NOT DETECTED NOT DETECTED Final   Parainfluenza Virus 4 NOT DETECTED NOT DETECTED Final   Respiratory Syncytial Virus NOT DETECTED NOT DETECTED Final   Bordetella pertussis NOT DETECTED NOT DETECTED Final   Bordetella Parapertussis NOT DETECTED NOT  DETECTED Final   Chlamydophila pneumoniae NOT DETECTED NOT DETECTED Final   Mycoplasma pneumoniae NOT DETECTED NOT DETECTED Final    Comment: Performed at [facility], [address], [place], [place] [address]  Culture, MAINE Urine     Status: Abnormal   Collection Time: 01/28/24 12:03 PM   Specimen: Urine, Random  Result Value Ref Range Status   Specimen Description URINE, RANDOM  Final   Special Requests NONE  Final   Culture (A)  Final    40,000 COLONIES/mL YEAST NO GROUP B STREP (S.AGALACTIAE) ISOLATED Performed at [facility], [address] [place], [place] [address]    Report Status 01/29/2024 FINAL  Final  MRSA Next Gen by PCR, Nasal     Status: Abnormal   Collection Time: 01/29/24  4:05 PM   Specimen: Nasal Mucosa; Nasal Swab  Result Value Ref Range Status   MRSA by PCR Next Gen DETECTED (A) NOT DETECTED Final    Comment: RESULT CALLED TO, READ BACK BY AND VERIFIED WITH: RN ANNITTA BATTLE [phone] AT 1724, ADC (NOTE) The GeneXpert MRSA Assay (FDA approved for NASAL specimens only), is one component of a comprehensive MRSA colonization surveillance program. It is not intended to diagnose MRSA infection nor to guide or monitor treatment for MRSA infections. Test performance is not FDA approved in patients less than 58 years old. Performed at [facility], [address], [place], [place] [address]   Culture, blood (Routine X 2) w Reflex to ID Panel     Status: None (Preliminary result)   Collection Time: 02/01/24 12:07 PM   Specimen: BLOOD RIGHT ARM  Result Value Ref Range Status   Specimen Description BLOOD RIGHT ARM  Final   Special Requests   Final    BOTTLES DRAWN AEROBIC ONLY Blood Culture results may not be optimal due to an inadequate volume of blood received in culture bottles   Culture   Final    NO GROWTH < 24 HOURS Performed at [facility], [address], [place], [place] [address]    Report Status PENDING  Incomplete  Culture, blood  (Routine X 2) w Reflex to ID Panel     Status: None (Preliminary result)   Collection Time: 02/01/24 12:07 PM   Specimen: BLOOD RIGHT ARM  Result Value Ref Range Status   Specimen Description BLOOD RIGHT ARM  Final   Special Requests   Final    BOTTLES DRAWN AEROBIC ONLY Blood Culture results may not be optimal due to an inadequate volume of blood received in culture bottles   Culture   Final    NO GROWTH < 24 HOURS Performed at [facility], [address] [place], [place] [address]    Report Status PENDING  Incomplete         Radiology Studies: US  Abdomen Limited RUQ (LIVER/GB) Result Date: 02/01/2024 CLINICAL DATA:  Hypotension. EXAM: ULTRASOUND ABDOMEN LIMITED RIGHT UPPER QUADRANT COMPARISON:  None Available. FINDINGS: Gallbladder: Status post cholecystectomy. Common bile duct: Diameter: 6 mm which is within normal limits. Liver: Evaluation is limited due to body habitus. No definite focal abnormality is noted. Evaluation of echotexture is limited as a result. Portal vein is patent on color Doppler imaging with normal direction of blood flow towards the liver. Other: None. IMPRESSION: Status post cholecystectomy. Evaluation of the liver is limited due to body habitus. Electronically Signed   By: Lynwood Landy Raddle M.D.   On: 02/01/2024 09:52   DG CHEST PORT 1 VIEW Result Date: 02/01/2024 CLINICAL DATA:  355160 Hypotension 355160. EXAM: PORTABLE CHEST 1 VIEW COMPARISON:  01/31/2024. FINDINGS: Low lung volume. Elevated right hemidiaphragm noted. There are mild-to-moderately increased vascular markings, likely accentuated by low lung volume. No frank pulmonary edema. Bilateral lung fields are otherwise clear. No acute consolidation or lung collapse. Bilateral costophrenic angles are clear. Stable cardio-mediastinal silhouette. No acute osseous abnormalities. The soft tissues are within normal limits. Right IJ central venous catheter noted with its tip overlying the midportion of superior  vena cava. Enteric tube is seen coursing below the left hemidiaphragm however, its tip and side hole are not included in the view. IMPRESSION: Low lung volume. Otherwise no acute cardiopulmonary abnormality. Electronically Signed   By: Ree Molt M.D.   On: 02/01/2024 08:10        Scheduled Meds:  Chlorhexidine  Gluconate Cloth  6 each Topical Daily   Gerhardt's butt cream   Topical TID   Continuous Infusions:  fentaNYL  infusion INTRAVENOUS 50 mcg/hr (02/02/24 0657)     LOS: 5 days    Time spent: 35 minutes    Cara Aguino JONETTA Fairly, DO [facility]  If 7PM-7AM, please contact night-coverage [email or web address] 02/02/2024, 4:05 PM

## 2024-02-02 NOTE — Care Management Important Message (Signed)
 Important Message  Patient Details  Name: [name] MRN: [id number] Date of Birth: Jun 04, 1946   Important Message Given:  Yes - Medicare IM     [name] 02/02/2024, 3:19 PM

## 2024-02-03 DIAGNOSIS — N170 Acute kidney failure with tubular necrosis: Secondary | ICD-10-CM | POA: Diagnosis not present

## 2024-02-03 DIAGNOSIS — Z515 Encounter for palliative care: Secondary | ICD-10-CM

## 2024-02-03 DIAGNOSIS — J9601 Acute respiratory failure with hypoxia: Secondary | ICD-10-CM | POA: Diagnosis not present

## 2024-02-03 DIAGNOSIS — K922 Gastrointestinal hemorrhage, unspecified: Secondary | ICD-10-CM | POA: Diagnosis not present

## 2024-02-03 LAB — GLUCOSE, CAPILLARY: Glucose-Capillary: 73 mg/dL (ref 70–99)

## 2024-02-03 NOTE — Plan of Care (Signed)
  Problem: Education: Goal: Ability to describe self-care measures that may prevent or decrease complications (Diabetes Survival Skills Education) will improve Outcome: Not Applicable Goal: Individualized Educational Video(s) Outcome: Not Applicable   Problem: Coping: Goal: Ability to adjust to condition or change in health will improve Outcome: Not Applicable   Problem: Fluid Volume: Goal: Ability to maintain a balanced intake and output will improve Outcome: Not Applicable   Problem: Health Behavior/Discharge Planning: Goal: Ability to identify and utilize available resources and services will improve Outcome: Not Applicable Goal: Ability to manage health-related needs will improve Outcome: Not Applicable   Problem: Metabolic: Goal: Ability to maintain appropriate glucose levels will improve Outcome: Not Applicable   Problem: Nutritional: Goal: Maintenance of adequate nutrition will improve Outcome: Not Applicable Goal: Progress toward achieving an optimal weight will improve Outcome: Not Applicable   Problem: Skin Integrity: Goal: Risk for impaired skin integrity will decrease Outcome: Not Applicable   Problem: Tissue Perfusion: Goal: Adequacy of tissue perfusion will improve Outcome: Not Applicable   Problem: Education: Goal: Knowledge of General Education information will improve Description: Including pain rating scale, medication(s)/side effects and non-pharmacologic comfort measures Outcome: Not Applicable   Problem: Health Behavior/Discharge Planning: Goal: Ability to manage health-related needs will improve Outcome: Not Applicable   Problem: Clinical Measurements: Goal: Ability to maintain clinical measurements within normal limits will improve Outcome: Not Applicable Goal: Will remain free from infection Outcome: Not Applicable Goal: Diagnostic test results will improve Outcome: Not Applicable Goal: Respiratory complications will improve Outcome: Not  Applicable Goal: Cardiovascular complication will be avoided Outcome: Not Applicable   Problem: Activity: Goal: Risk for activity intolerance will decrease Outcome: Not Applicable   Problem: Nutrition: Goal: Adequate nutrition will be maintained Outcome: Not Applicable   Problem: Coping: Goal: Level of anxiety will decrease Outcome: Not Applicable   Problem: Elimination: Goal: Will not experience complications related to bowel motility Outcome: Not Applicable Goal: Will not experience complications related to urinary retention Outcome: Not Applicable   Problem: Pain Managment: Goal: General experience of comfort will improve and/or be controlled Outcome: Not Applicable   Problem: Safety: Goal: Ability to remain free from injury will improve Outcome: Not Applicable   Problem: Skin Integrity: Goal: Risk for impaired skin integrity will decrease Outcome: Not Applicable

## 2024-02-03 NOTE — Progress Notes (Signed)
 PROGRESS NOTE    Mona Ayars [facility] [name]  FMW:[id number] DOB: 1946-05-07 DOA: 01/27/2024 PCP: Lenon Layman ORN, MD   Brief Narrative:    [name] is a 78 y/o F with a PMH significant for chronic diastolic heart failure, DVT on Eliquis, suspected OHS, morbid obesity, and chronic hypoxic respiratory failure on 3 L O2 by [place] who presented to [facility] for hematuria and rectal bleeding with hospital course c/b acute renal failure, uremic encephalopathy, and hypotension requiring CRRT and ICU admission.  She was admitted for hematuria and rectal bleeding with hospital course complicated by acute renal failure and uremic encephalopathy requiring CRRT.  She was severely hypotensive in the setting of possible sepsis and required norepinephrine  for blood pressure support.  Her overall prognosis was quite poor and she was transitioned to comfort care and has anticipated survival time of 5 hours to days.  Assessment & Plan:   Principal Problem:   ARF (acute renal failure) (HCC) Active Problems:   Acquired hypothyroidism   Chronic diastolic CHF (congestive heart failure) (HCC)   Cognitive deficit as late effect of traumatic brain injury (HCC)   HTN, goal below 140/90   Morbid obesity with BMI of 40.0-44.9, adult (HCC)   OSA (obstructive sleep apnea)   Anemia   Leucocytosis   Acute encephalopathy   Bleeding   History of DVT (deep vein thrombosis)   Altered mental status   Uremic encephalopathy   Acute hypoxic respiratory failure (HCC)   Acute GI bleeding  Assessment and Plan:   Continue comfort care measures -Appreciate palliative care -Anticipate in-hospital death with prognosis of hours to days    Subjective: Patient seen and evaluated today and appears to be comfortable with no acute overnight events noted.  Objective: Vitals:   02/02/24 2208 02/03/24 0731 02/03/24 0738 02/03/24 1135  BP: (!) 113/46 (!) 102/43 (!) 102/43 (!) 104/54  Pulse: 76 80 73 78  Resp:  18 18    Temp: 98.2 F (36.8 C)  98.2 F (36.8 C) 98.3 F (36.8 C)  TempSrc:      SpO2:   96% 97%  Weight:      Height:       No intake or output data in the 24 hours ending 02/03/24 1335  Filed Weights   01/31/24 0145 02/01/24 0530 02/02/24 0500  Weight: 128.6 kg 131.1 kg 133.9 kg    Examination:  General exam: Appears calm and comfortable  Respiratory system: Clear to auscultation. Respiratory effort normal. Cardiovascular system: S1 & S2 heard, RRR.  Gastrointestinal system: Abdomen is soft Central nervous system: Unresponsive Extremities: No edema Skin: No significant lesions noted    Data Reviewed: I have personally reviewed following labs and imaging studies  CBC: Recent Labs  Lab 01/27/24 1723 01/27/24 1750 01/28/24 0409 01/28/24 0410 01/29/24 1617 01/29/24 2309 01/30/24 0410 01/31/24 0253 02/01/24 0928  WBC 22.2*  --  21.7*  --  19.9* 19.2* 20.5* 19.9* 14.0*  NEUTROABS 20.3*  --  19.2*  --  17.3* 16.2*  --   --   --   HGB 11.6*   < > 10.6*   < > 8.2* 8.2* 8.4* 7.8* 7.1*  HCT 33.6*   < > 32.1*   < > 24.8* 23.4* 24.4* 23.5* 22.7*  MCV 92.1  --  95.8  --  94.7 91.1 91.7 96.3 100.4*  PLT 204  --  194   < > 99* 104* 97* 66* 65*   < > = values in this interval not  displayed.   Basic Metabolic Panel: Recent Labs  Lab 01/28/24 0409 01/29/24 0649 01/29/24 1617 01/30/24 0410 01/30/24 1809 01/31/24 0253 02/01/24 0300  NA 134*   < > 136 135 136 136 134*  K 4.0   < > 3.1* 3.3* 4.4 4.2 3.8  CL 99   < > 104 101 100 103 101  CO2 19*   < > 21* 23 25 25 24   GLUCOSE 240*   < > 177* 174* 166* 131* 262*  BUN 129*   < > 108* 34* 11 6* 32*  CREATININE 3.27*   < > 2.87* 1.32* 0.81 0.63 1.64*  CALCIUM 9.8   < > 8.4* 8.0* 7.6* 7.9* 8.3*  MG 2.1  --   --  2.0  --  2.1 2.2  PHOS 5.2*   < > 4.1 1.0* 3.4 1.9* 2.2*  2.2*   < > = values in this interval not displayed.   GFR: Estimated Creatinine Clearance: 39.8 mL/min (A) (by C-G formula based on SCr of 1.64 mg/dL  (H)). Liver Function Tests: Recent Labs  Lab 01/27/24 1723 01/28/24 0409 01/29/24 0649 01/29/24 1617 01/30/24 0410 01/30/24 1809 01/31/24 0253 02/01/24 0300  AST 26 21  --   --   --   --   --   --   ALT 17 15  --   --   --   --   --   --   ALKPHOS 43 42  --   --   --   --   --   --   BILITOT 0.9 1.1  --   --   --   --   --   --   PROT 6.3* 5.8*  --   --   --   --   --   --   ALBUMIN  3.3* 3.0*   < > 2.0* 2.0* 1.9* 1.8* 2.2*   < > = values in this interval not displayed.   No results for input(s): LIPASE, AMYLASE in the last 168 hours. Recent Labs  Lab 01/27/24 2343  AMMONIA 30   Coagulation Profile: Recent Labs  Lab 01/27/24 1723 01/28/24 0410  INR 1.9* 1.9*   Cardiac Enzymes: Recent Labs  Lab 01/28/24 0409 01/29/24 0649  CKTOTAL 98 68   BNP (last 3 results) No results for input(s): PROBNP in the last 8760 hours. HbA1C: No results for input(s): HGBA1C in the last 72 hours. CBG: Recent Labs  Lab 02/01/24 0323 02/01/24 0746 02/01/24 1108 02/01/24 1516 02/03/24 0804  GLUCAP 241* 273* 211* 121* 73   Lipid Profile: No results for input(s): CHOL, HDL, LDLCALC, TRIG, CHOLHDL, LDLDIRECT in the last 72 hours. Thyroid Function Tests: No results for input(s): TSH, T4TOTAL, FREET4, T3FREE, THYROIDAB in the last 72 hours. Anemia Panel: No results for input(s): VITAMINB12, FOLATE, FERRITIN, TIBC, IRON, RETICCTPCT in the last 72 hours. Sepsis Labs: Recent Labs  Lab 01/27/24 1631 01/27/24 1845  LATICACIDVEN 1.7 1.3    Recent Results (from the past 240 hours)  Culture, blood (Routine X 2) w Reflex to ID Panel     Status: None   Collection Time: 01/27/24 11:30 PM   Specimen: BLOOD  Result Value Ref Range Status   Specimen Description BLOOD SITE NOT SPECIFIED  Final   Special Requests   Final    BOTTLES DRAWN AEROBIC AND ANAEROBIC Blood Culture results may not be optimal due to an inadequate volume of blood received in  culture bottles   Culture  Final    NO GROWTH 5 DAYS Performed at [facility], [address], [place], [place] [address]    Report Status 02/02/2024 FINAL  Final  Culture, blood (Routine X 2) w Reflex to ID Panel     Status: None   Collection Time: 01/27/24 11:35 PM   Specimen: BLOOD  Result Value Ref Range Status   Specimen Description BLOOD SITE NOT SPECIFIED  Final   Special Requests   Final    BOTTLES DRAWN AEROBIC AND ANAEROBIC Blood Culture results may not be optimal due to an inadequate volume of blood received in culture bottles   Culture   Final    NO GROWTH 5 DAYS Performed at [facility], [address], [place], [place] [address]    Report Status 02/02/2024 FINAL  Final  Respiratory (~20 pathogens) panel by PCR     Status: None   Collection Time: 01/28/24  5:59 AM   Specimen: Nasopharyngeal Swab; Respiratory  Result Value Ref Range Status   Adenovirus NOT DETECTED NOT DETECTED Final   Coronavirus 229E NOT DETECTED NOT DETECTED Final    Comment: (NOTE) The Coronavirus on the Respiratory Panel, DOES NOT test for the novel  Coronavirus (2019 nCoV)    Coronavirus HKU1 NOT DETECTED NOT DETECTED Final   Coronavirus NL63 NOT DETECTED NOT DETECTED Final   Coronavirus OC43 NOT DETECTED NOT DETECTED Final   Metapneumovirus NOT DETECTED NOT DETECTED Final   Rhinovirus / Enterovirus NOT DETECTED NOT DETECTED Final   Influenza A NOT DETECTED NOT DETECTED Final   Influenza B NOT DETECTED NOT DETECTED Final   Parainfluenza Virus 1 NOT DETECTED NOT DETECTED Final   Parainfluenza Virus 2 NOT DETECTED NOT DETECTED Final   Parainfluenza Virus 3 NOT DETECTED NOT DETECTED Final   Parainfluenza Virus 4 NOT DETECTED NOT DETECTED Final   Respiratory Syncytial Virus NOT DETECTED NOT DETECTED Final   Bordetella pertussis NOT DETECTED NOT DETECTED Final   Bordetella Parapertussis NOT DETECTED NOT DETECTED Final   Chlamydophila pneumoniae NOT DETECTED NOT DETECTED Final    Mycoplasma pneumoniae NOT DETECTED NOT DETECTED Final    Comment: Performed at [facility], [address], [place], [place] [address]  Culture, MAINE Urine     Status: Abnormal   Collection Time: 01/28/24 12:03 PM   Specimen: Urine, Random  Result Value Ref Range Status   Specimen Description URINE, RANDOM  Final   Special Requests NONE  Final   Culture (A)  Final    40,000 COLONIES/mL YEAST NO GROUP B STREP (S.AGALACTIAE) ISOLATED Performed at [facility], [address], [place], [place] [address]    Report Status 01/29/2024 FINAL  Final  MRSA Next Gen by PCR, Nasal     Status: Abnormal   Collection Time: 01/29/24  4:05 PM   Specimen: Nasal Mucosa; Nasal Swab  Result Value Ref Range Status   MRSA by PCR Next Gen DETECTED (A) NOT DETECTED Final    Comment: RESULT CALLED TO, READ BACK BY AND VERIFIED WITH: RN ANNITTA BATTLE [phone] AT 1724, ADC (NOTE) The GeneXpert MRSA Assay (FDA approved for NASAL specimens only), is one component of a comprehensive MRSA colonization surveillance program. It is not intended to diagnose MRSA infection nor to guide or monitor treatment for MRSA infections. Test performance is not FDA approved in patients less than 15 years old. Performed at [facility], [address], [place], [place] [address]   Culture, blood (Routine X  2) w Reflex to ID Panel     Status: None (Preliminary result)   Collection Time: 02/01/24 12:07 PM   Specimen: BLOOD RIGHT ARM  Result Value Ref Range Status   Specimen Description BLOOD RIGHT ARM  Final   Special Requests   Final    BOTTLES DRAWN AEROBIC ONLY Blood Culture results may not be optimal due to an inadequate volume of blood received in culture bottles   Culture   Final    NO GROWTH 2 DAYS Performed at [facility], [address], [place], [place] [address]    Report Status PENDING  Incomplete  Culture, blood (Routine X 2) w Reflex to ID Panel     Status: None (Preliminary result)    Collection Time: 02/01/24 12:07 PM   Specimen: BLOOD RIGHT ARM  Result Value Ref Range Status   Specimen Description BLOOD RIGHT ARM  Final   Special Requests   Final    BOTTLES DRAWN AEROBIC ONLY Blood Culture results may not be optimal due to an inadequate volume of blood received in culture bottles   Culture   Final    NO GROWTH 2 DAYS Performed at [facility], [address], [place], [place] [address]    Report Status PENDING  Incomplete         Radiology Studies: No results found.       Scheduled Meds:  Chlorhexidine  Gluconate Cloth  6 each Topical Daily   Gerhardt's butt cream   Topical TID   Continuous Infusions:  fentaNYL  infusion INTRAVENOUS 50 mcg/hr (02/02/24 0657)     LOS: 6 days    Time spent: 35 minutes    Exander Shaul JONETTA Fairly, DO [facility]  If 7PM-7AM, please contact night-coverage [email or web address] 02/03/2024, 1:35 PM

## 2024-02-03 NOTE — Progress Notes (Signed)
                                                     Daily Progress Note   Patient Name: Joesphine Schemm [facility] [name]       Date: 02/03/2024 DOB: 1945/11/08  Age: 78 y.o. MRN#: [id number] Attending Physician: Maree Adron BIRCH, DO Primary Care Physician: Lenon Layman ORN, MD Admit Date: 01/27/2024  Reason for Consultation/Follow-up: Establishing goals of care and Terminal Care  Subjective: Medical records reviewed including progress notes, labs, imaging, MAR.  Patient is on fentanyl  infusion, no as needed boluses or other comfort medications required in the past 24 hours.  Patient assessed at the bedside.  She attempted to open her eyes to my voice, denied pain or distress, then went back to sleep.  Breathing unlabored.  No visitors present during my visit.  Questions and concerns addressed. PMT will continue to support holistically.   Length of Stay: 6   Physical Exam Vitals and nursing note reviewed.  Constitutional:      General: She is not in acute distress.    Appearance: She is ill-appearing.  Cardiovascular:     Rate and Rhythm: Normal rate.  Pulmonary:     Effort: Pulmonary effort is normal.  Neurological:     Mental Status: She is lethargic.             Vital Signs: BP (!) 102/43 (BP Location: Left Leg)   Pulse 73   Temp 98.2 F (36.8 C)   Resp 18   Ht 5' 6 (1.676 m)   Wt 133.9 kg   SpO2 96%   BMI 47.65 kg/m  SpO2: SpO2: 96 % O2 Device: O2 Device: Room Air O2 Flow Rate: O2 Flow Rate (L/min): (S) 6 L/min      Palliative Assessment/Data: 10%   Palliative Care Assessment & Plan   Patient Profile: Per intake H&P -->  [name] is a 78 y/o F with a PMH significant for chronic diastolic heart failure, DVT on Eliquis, suspected OHS, morbid obesity, and chronic hypoxic respiratory failure on 3 L O2 by [place] who presented to [facility] for hematuria and rectal bleeding  with hospital course c/b acute renal failure. Family meeting held on 8/19 and goals are geared towards comfort. The PMT is involved to offer ongoing support of symptoms.   Assessment: End of life care  Recommendations/Plan: Continue DNR/DNI Continue comfort focused care including fentanyl  infusion, no adjustments required today PMT will continue to follow and support   Prognosis:  Hours - Days  Discharge Planning: Anticipated Hospital Death           Karn Derk SHAUNNA Fell, [facility]  Palliative Medicine Team Team phone # [phone]  Thank you for allowing the Palliative Medicine Team to assist in the care of this patient. Please utilize secure chat with additional questions, if there is no response within 30 minutes please call the above phone number.  Palliative Medicine Team providers are available by phone from 7am to 7pm daily and can be reached through the team cell phone.  Should this patient require assistance outside of these hours, please call the patient's attending physician.

## 2024-02-04 DIAGNOSIS — Z789 Other specified health status: Secondary | ICD-10-CM

## 2024-02-04 DIAGNOSIS — G9349 Other encephalopathy: Secondary | ICD-10-CM

## 2024-02-04 DIAGNOSIS — K922 Gastrointestinal hemorrhage, unspecified: Secondary | ICD-10-CM | POA: Diagnosis not present

## 2024-02-04 DIAGNOSIS — Z66 Do not resuscitate: Secondary | ICD-10-CM

## 2024-02-04 DIAGNOSIS — Z515 Encounter for palliative care: Secondary | ICD-10-CM | POA: Diagnosis not present

## 2024-02-04 DIAGNOSIS — N170 Acute kidney failure with tubular necrosis: Secondary | ICD-10-CM | POA: Diagnosis not present

## 2024-02-04 NOTE — Progress Notes (Signed)
 Daily Progress Note   Date: 02/04/2024   Patient Name: [name] [facility] [name]  DOB: Oct 15, 1945  MRN: [id number]  Age / Sex: 78 y.o., female  Attending Physician: Maree Adron BIRCH, DO Primary Care Physician: Lenon Layman ORN, MD Admit Date: 01/27/2024 Length of Stay: 7 days  Reason for Follow-up: Establishing goals of care, Non pain symptom management, Pain control, Psychosocial/spiritual support, and Terminal Care  Past Medical History:  Diagnosis Date   Benign positional vertigo 03/04/2016   Brain injury [facility])    CHF (congestive heart failure) (HCC)    Climacteric    Depression    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 Updates received. Patient Assessed. Created space and opportunity for patient  and family to explore thoughts and feelings regarding current medical situation.  Today's Discussion: Today before meeting with the patient/family, I reviewed the chart notes including hospitalist note, nursing note. I also reviewed vital signs, nursing flowsheets, medication administrations record, labs, and imaging.  No labs due to comfort care status.  Vital signs show temperature 97.6, heart rate 73, respiratory rate 20, blood pressure 93/43, satting 76% on room air.  The patient remains on a fentanyl  drip at 50 mcg/h with no boluses needed in the past 24 hours.  No other symptom management needs in the past 24 hours.  Today saw the patient at the bedside, no family was present.  The patient did not open her eyes or make eye contact to voice.  However, while is observing her while her respirations are even and unlabored, she  did have some moaning and grimacing indicating likely pain.  I bolused the patient 100 mcg of fentanyl  from the pump and notified the nurse of such. I encouraged nursing to bolus the patient as needed symptoms.  Palliative medicine will continue to follow for symptom management needs.  Review of Systems  Unable to perform ROS   Objective:   Primary Diagnoses: Present on Admission:  ARF (acute renal failure) (HCC)  Acquired hypothyroidism  Chronic diastolic CHF (congestive heart failure) (HCC)  HTN, goal below 140/90  OSA (obstructive sleep apnea)   Vital Signs:  BP (!) 93/43 (BP Location: Left Leg)   Pulse 73   Temp 97.9 F (36.6 C)   Resp 20   Ht 5' 6 (1.676 m)   Wt 133.9 kg   SpO2 (!) 76%   BMI 47.65 kg/m   Physical Exam Vitals and nursing note reviewed.  Constitutional:      General: She is sleeping. She is not in acute distress.    Appearance: She is obese. She is ill-appearing.     Comments: Did not arouse to voice or light touch; moaning and movement indicative of pain  HENT:     Head: Atraumatic.  Cardiovascular:     Rate and Rhythm: Normal rate.  Pulmonary:     Effort: Pulmonary effort is normal. No respiratory distress.  Abdominal:     General: Abdomen is flat.  Skin:    General: Skin is warm and dry.  Neurological:  Mental Status: She is unresponsive.     Palliative Assessment/Data: 10%   Existing Vynca/ACP Documentation: None  Assessment & Plan:   HPI/Patient Profile:  [name] is a 78 y/o F with a PMH significant for chronic diastolic heart failure, DVT on Eliquis, suspected OHS, morbid obesity, and chronic hypoxic respiratory failure on 3 L O2 by [place] who presented to [facility] for hematuria and rectal bleeding with hospital course c/b acute renal failure. Family meeting held on 8/19 and goals are geared towards comfort. The PMT is involved to offer ongoing support of symptoms.   SUMMARY OF RECOMMENDATIONS   DNR-comfort Continue comfort  care See symptom management orders below Palliative medicine will continue to follow for symptom management  Symptom Management:  Tylenol  650 mg PR every 6 hours as needed fever Artificial tears 1 drop OU 4 times daily as needed dry eyes Fentanyl  infusion 0 to 400 mcg/h titrate per instructions Fentanyl  bolus via infusion 100 mcg IV every 15 minutes as needed uncontrolled pain, distress, RR greater than 18 Robinul  0.2 mg IV every 4 hours as needed excessive secretions Zofran  4 mg IV every 6 hours as needed nausea or vomiting  Code Status: DNR - Comfort  Prognosis: Hours - Days  Discharge Planning: Anticipated Hospital Death  Discussed with: Medical team, nursing team  Thank you for allowing us  to participate in the care of [name] [facility] [name] PMT will continue to support holistically.  Billing based on MDM: High  Problems Addressed: One acute or chronic illness or injury that poses a threat to life or bodily function  Risks: Parenteral controlled substances  Detailed review of medical records (labs, imaging, vital signs), medically appropriate exam, discussed with treatment team, counseling and education to patient, family, & staff, documenting clinical information, medication management, coordination of care  Camellia Kays, NP Palliative Medicine Team  Team Phone # [phone] (Nights/Weekends)  02/11/2021, 8:17 AM

## 2024-02-04 NOTE — Plan of Care (Signed)
  Problem: Pain Managment: Goal: General experience of comfort will improve and/or be controlled Outcome: Progressing   Problem: Safety: Goal: Ability to remain free from injury will improve Outcome: Progressing   Problem: Skin Integrity: Goal: Risk for impaired skin integrity will decrease Outcome: Progressing

## 2024-02-04 NOTE — Progress Notes (Signed)
 PROGRESS NOTE    [name]  FMW:[id number] DOB: 02/14/1946 DOA: 01/27/2024 PCP: Lenon Layman ORN, MD   Brief Narrative:    [name] is a 77 y/o F with a PMH significant for chronic diastolic heart failure, DVT on Eliquis, suspected OHS, morbid obesity, and chronic hypoxic respiratory failure on 3 L O2 by [place] who presented to [facility] for hematuria and rectal bleeding with hospital course c/b acute renal failure, uremic encephalopathy, and hypotension requiring CRRT and ICU admission.  She was admitted for hematuria and rectal bleeding with hospital course complicated by acute renal failure and uremic encephalopathy requiring CRRT.  She was severely hypotensive in the setting of possible sepsis and required norepinephrine  for blood pressure support.  Her overall prognosis was quite poor and she was transitioned to comfort care and has anticipated survival time of hours to days.  Assessment & Plan:   Principal Problem:   ARF (acute renal failure) (HCC) Active Problems:   Acquired hypothyroidism   Chronic diastolic CHF (congestive heart failure) (HCC)   Cognitive deficit as late effect of traumatic brain injury (HCC)   HTN, goal below 140/90   Morbid obesity with BMI of 40.0-44.9, adult (HCC)   OSA (obstructive sleep apnea)   Anemia   Leucocytosis   Acute encephalopathy   Bleeding   History of DVT (deep vein thrombosis)   Altered mental status   Uremic encephalopathy   Acute hypoxic respiratory failure (HCC)   Acute GI bleeding  Assessment and Plan:   Continue comfort care measures -Appreciate palliative care -Anticipate in-hospital death with prognosis of hours to days    Subjective: Patient seen and evaluated today and appears to be comfortable with no acute overnight events noted.  Objective: Vitals:   02/03/24 2050 02/04/24 0746 02/04/24 0746 02/04/24 1146  BP: (!) 109/56 (!) 91/41 (!) 93/43 (!) 120/51  Pulse: 89 84 73 (!) 112  Resp: 20 20     Temp: 98.3 F (36.8 C) 98 F (36.7 C) 97.9 F (36.6 C) 100.2 F (37.9 C)  TempSrc: Axillary Oral  Oral  SpO2: 98% 97% (!) 76% (!) 71%  Weight:      Height:        Intake/Output Summary (Last 24 hours) at 02/04/2024 1451 Last data filed at 02/04/2024 0433 Gross per 24 hour  Intake 267.34 ml  Output 300 ml  Net -32.66 ml    Filed Weights   01/31/24 0145 02/01/24 0530 02/02/24 0500  Weight: 128.6 kg 131.1 kg 133.9 kg    Examination:  General exam: Appears calm and comfortable  Respiratory system: Clear to auscultation. Respiratory effort normal. Cardiovascular system: S1 & S2 heard, RRR.  Gastrointestinal system: Abdomen is soft Central nervous system: Unresponsive Extremities: No edema Skin: No significant lesions noted    Data Reviewed: I have personally reviewed following labs and imaging studies  CBC: Recent Labs  Lab 01/29/24 1617 01/29/24 2309 01/30/24 0410 01/31/24 0253 02/01/24 0928  WBC 19.9* 19.2* 20.5* 19.9* 14.0*  NEUTROABS 17.3* 16.2*  --   --   --   HGB 8.2* 8.2* 8.4* 7.8* 7.1*  HCT 24.8* 23.4* 24.4* 23.5* 22.7*  MCV 94.7 91.1 91.7 96.3 100.4*  PLT 99* 104* 97* 66* 65*   Basic Metabolic Panel: Recent Labs  Lab 01/29/24 1617 01/30/24 0410 01/30/24 1809 01/31/24 0253 02/01/24 0300  NA 136 135 136 136 134*  K 3.1* 3.3* 4.4 4.2 3.8  CL 104 101 100 103 101  CO2 21* 23 25  25 24  GLUCOSE 177* 174* 166* 131* 262*  BUN 108* 34* 11 6* 32*  CREATININE 2.87* 1.32* 0.81 0.63 1.64*  CALCIUM 8.4* 8.0* 7.6* 7.9* 8.3*  MG  --  2.0  --  2.1 2.2  PHOS 4.1 1.0* 3.4 1.9* 2.2*  2.2*   GFR: Estimated Creatinine Clearance: 39.8 mL/min (A) (by C-G formula based on SCr of 1.64 mg/dL (H)). Liver Function Tests: Recent Labs  Lab 01/29/24 1617 01/30/24 0410 01/30/24 1809 01/31/24 0253 02/01/24 0300  ALBUMIN  2.0* 2.0* 1.9* 1.8* 2.2*   No results for input(s): LIPASE, AMYLASE in the last 168 hours. No results for input(s): AMMONIA in the last  168 hours.  Coagulation Profile: No results for input(s): INR, PROTIME in the last 168 hours.  Cardiac Enzymes: Recent Labs  Lab 01/29/24 0649  CKTOTAL 68   BNP (last 3 results) No results for input(s): PROBNP in the last 8760 hours. HbA1C: No results for input(s): HGBA1C in the last 72 hours. CBG: Recent Labs  Lab 02/01/24 0323 02/01/24 0746 02/01/24 1108 02/01/24 1516 02/03/24 0804  GLUCAP 241* 273* 211* 121* 73   Lipid Profile: No results for input(s): CHOL, HDL, LDLCALC, TRIG, CHOLHDL, LDLDIRECT in the last 72 hours. Thyroid Function Tests: No results for input(s): TSH, T4TOTAL, FREET4, T3FREE, THYROIDAB in the last 72 hours. Anemia Panel: No results for input(s): VITAMINB12, FOLATE, FERRITIN, TIBC, IRON, RETICCTPCT in the last 72 hours. Sepsis Labs: No results for input(s): PROCALCITON, LATICACIDVEN in the last 168 hours.   Recent Results (from the past 240 hours)  Culture, blood (Routine X 2) w Reflex to ID Panel     Status: None   Collection Time: 01/27/24 11:30 PM   Specimen: BLOOD  Result Value Ref Range Status   Specimen Description BLOOD SITE NOT SPECIFIED  Final   Special Requests   Final    BOTTLES DRAWN AEROBIC AND ANAEROBIC Blood Culture results may not be optimal due to an inadequate volume of blood received in culture bottles   Culture   Final    NO GROWTH 5 DAYS Performed at [facility], [address] [place], [place] [address]    Report Status 02/02/2024 FINAL  Final  Culture, blood (Routine X 2) w Reflex to ID Panel     Status: None   Collection Time: 01/27/24 11:35 PM   Specimen: BLOOD  Result Value Ref Range Status   Specimen Description BLOOD SITE NOT SPECIFIED  Final   Special Requests   Final    BOTTLES DRAWN AEROBIC AND ANAEROBIC Blood Culture results may not be optimal due to an inadequate volume of blood received in culture bottles   Culture   Final    NO GROWTH 5 DAYS Performed  at [facility], [address], [place], [place] [address]    Report Status 02/02/2024 FINAL  Final  Respiratory (~20 pathogens) panel by PCR     Status: None   Collection Time: 01/28/24  5:59 AM   Specimen: Nasopharyngeal Swab; Respiratory  Result Value Ref Range Status   Adenovirus NOT DETECTED NOT DETECTED Final   Coronavirus 229E NOT DETECTED NOT DETECTED Final    Comment: (NOTE) The Coronavirus on the Respiratory Panel, DOES NOT test for the novel  Coronavirus (2019 nCoV)    Coronavirus HKU1 NOT DETECTED NOT DETECTED Final   Coronavirus NL63 NOT DETECTED NOT DETECTED Final   Coronavirus OC43 NOT DETECTED NOT DETECTED Final   Metapneumovirus NOT DETECTED NOT DETECTED Final  Rhinovirus / Enterovirus NOT DETECTED NOT DETECTED Final   Influenza A NOT DETECTED NOT DETECTED Final   Influenza B NOT DETECTED NOT DETECTED Final   Parainfluenza Virus 1 NOT DETECTED NOT DETECTED Final   Parainfluenza Virus 2 NOT DETECTED NOT DETECTED Final   Parainfluenza Virus 3 NOT DETECTED NOT DETECTED Final   Parainfluenza Virus 4 NOT DETECTED NOT DETECTED Final   Respiratory Syncytial Virus NOT DETECTED NOT DETECTED Final   Bordetella pertussis NOT DETECTED NOT DETECTED Final   Bordetella Parapertussis NOT DETECTED NOT DETECTED Final   Chlamydophila pneumoniae NOT DETECTED NOT DETECTED Final   Mycoplasma pneumoniae NOT DETECTED NOT DETECTED Final    Comment: Performed at [facility], [address], [place], [place] [address]  Culture, MAINE Urine     Status: Abnormal   Collection Time: 01/28/24 12:03 PM   Specimen: Urine, Random  Result Value Ref Range Status   Specimen Description URINE, RANDOM  Final   Special Requests NONE  Final   Culture (A)  Final    40,000 COLONIES/mL YEAST NO GROUP B STREP (S.AGALACTIAE) ISOLATED Performed at [facility], [address] [place], [place] [address]    Report Status 01/29/2024 FINAL  Final  MRSA Next Gen by PCR, Nasal     Status:  Abnormal   Collection Time: 01/29/24  4:05 PM   Specimen: Nasal Mucosa; Nasal Swab  Result Value Ref Range Status   MRSA by PCR Next Gen DETECTED (A) NOT DETECTED Final    Comment: RESULT CALLED TO, READ BACK BY AND VERIFIED WITH: RN ANNITTA BATTLE [phone] AT 1724, ADC (NOTE) The GeneXpert MRSA Assay (FDA approved for NASAL specimens only), is one component of a comprehensive MRSA colonization surveillance program. It is not intended to diagnose MRSA infection nor to guide or monitor treatment for MRSA infections. Test performance is not FDA approved in patients less than 41 years old. Performed at [facility], [address], [place], [place] [address]   Culture, blood (Routine X 2) w Reflex to ID Panel     Status: None (Preliminary result)   Collection Time: 02/01/24 12:07 PM   Specimen: BLOOD RIGHT ARM  Result Value Ref Range Status   Specimen Description BLOOD RIGHT ARM  Final   Special Requests   Final    BOTTLES DRAWN AEROBIC ONLY Blood Culture results may not be optimal due to an inadequate volume of blood received in culture bottles   Culture   Final    NO GROWTH 3 DAYS Performed at [facility], [address], [place], [place] [address]    Report Status PENDING  Incomplete  Culture, blood (Routine X 2) w Reflex to ID Panel     Status: None (Preliminary result)   Collection Time: 02/01/24 12:07 PM   Specimen: BLOOD RIGHT ARM  Result Value Ref Range Status   Specimen Description BLOOD RIGHT ARM  Final   Special Requests   Final    BOTTLES DRAWN AEROBIC ONLY Blood Culture results may not be optimal due to an inadequate volume of blood received in culture bottles   Culture   Final    NO GROWTH 3 DAYS Performed at [facility], [address], [place], [place] [address]    Report Status PENDING  Incomplete         Radiology Studies: No results found.       Scheduled Meds:  Chlorhexidine  Gluconate Cloth  6 each Topical Daily  Gerhardt's butt  cream   Topical TID   Continuous Infusions:  fentaNYL  infusion INTRAVENOUS 50 mcg/hr (02/04/24 0433)     LOS: 7 days    Time spent: 35 minutes    Kirk Sampley D Maree, DO [facility]  If 7PM-7AM, please contact night-coverage [email or web address] 02/04/2024, 2:51 PM

## 2024-02-04 NOTE — Progress Notes (Signed)
 Assume care at 0700, patient lying in bed with eyes closed, no acute distress, even un-labored respirations, skin warm and dry to the touch. Patient continues to be on comfort measures. The call bell within reach and safety measures are in place.

## 2024-02-05 DIAGNOSIS — N170 Acute kidney failure with tubular necrosis: Secondary | ICD-10-CM | POA: Diagnosis not present

## 2024-02-05 DIAGNOSIS — G9340 Encephalopathy, unspecified: Secondary | ICD-10-CM | POA: Diagnosis not present

## 2024-02-05 DIAGNOSIS — Z515 Encounter for palliative care: Secondary | ICD-10-CM | POA: Diagnosis not present

## 2024-02-05 NOTE — Plan of Care (Signed)
  Problem: Pain Managment: Goal: General experience of comfort will improve and/or be controlled Outcome: Progressing   Problem: Safety: Goal: Ability to remain free from injury will improve Outcome: Progressing   Problem: Skin Integrity: Goal: Risk for impaired skin integrity will decrease Outcome: Progressing

## 2024-02-05 NOTE — Progress Notes (Signed)
 Daily Progress Note   Patient Name: [name] [facility] [name]       Date: 02/05/2024 DOB: Sep 08, 1945  Age: 78 y.o. MRN#: [id number] Attending Physician: Arlice Reichert, MD Primary Care Physician: Lenon Layman ORN, MD Admit Date: 01/27/2024  Reason for Consultation/Follow-up: Terminal Care  Subjective: No family at bedside. Patient unable to communicate.  Length of Stay: 8  Current Medications: Scheduled Meds:   Chlorhexidine  Gluconate Cloth  6 each Topical Daily   Gerhardt's butt cream   Topical TID    Continuous Infusions:  fentaNYL  infusion INTRAVENOUS 50 mcg/hr (02/05/24 0256)    PRN Meds: acetaminophen  **OR** acetaminophen , artificial tears, fentaNYL , glycopyrrolate  **OR** glycopyrrolate  **OR** glycopyrrolate , ondansetron  (ZOFRAN ) IV, mouth rinse  Physical Exam Constitutional:      General: She is not in acute distress.    Appearance: She is ill-appearing.     Comments: Opens eyes to voice, occ follows commands, unable to verbalize, no nonverbal s/s of pain or discomfort  Pulmonary:     Effort: Pulmonary effort is normal.  Skin:    General: Skin is warm and dry.             Vital Signs: BP (!) 99/49 (BP Location: Left Leg)   Pulse 81   Temp 98 F (36.7 C) (Axillary)   Resp (!) 22   Ht 5' 6 (1.676 m)   Wt 132.2 kg   SpO2 97%   BMI 47.04 kg/m  SpO2: SpO2: 97 % O2 Device: O2 Device: Room Air O2 Flow Rate: O2 Flow Rate (L/min): (S) 6 L/min  Intake/output summary:  Intake/Output Summary (Last 24 hours) at 02/05/2024 1030 Last data filed at 02/05/2024 0441 Gross per 24 hour  Intake 121.77 ml  Output 250 ml  Net -128.23 ml   LBM: Last BM Date : 02/04/24 Baseline Weight: Weight: 131.5 kg Most recent weight: Weight: 132.2 kg       Palliative Assessment/Data: PPS  10%      Patient Active Problem List   Diagnosis Date Noted   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   Acute hypoxic respiratory failure (HCC) 01/29/2024   Acute GI bleeding 01/29/2024   Anemia 01/28/2024   Leucocytosis 01/28/2024   Acute encephalopathy 01/28/2024   Bleeding 01/28/2024   History of DVT (deep vein thrombosis) 01/28/2024   Altered mental status 01/28/2024   Contusion of left foot 03/27/2023   Closed fracture of distal end of right fibula with routine healing 03/23/2023   Fibula fracture 03/23/2023   ARF (acute renal failure) (HCC) 10/27/2022   Weakness 10/27/2022   Venous stasis 10/27/2022   Community acquired pneumonia 07/03/2020   OSA (obstructive sleep apnea) 07/03/2020   CAP (community acquired pneumonia) 07/03/2020   Sepsis (HCC) 07/02/2020   History of anesthesia reaction 02/20/2020   Swelling of limb 02/20/2020   Chronic diastolic CHF (congestive heart failure) (HCC) 08/22/2019   Edema 07/29/2019   Prediabetes 07/07/2019   Depression 04/26/2019   Disease of thyroid gland 04/26/2019   GERD (gastroesophageal reflux disease) 04/26/2019   History of head injury 04/26/2019   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/26/2019   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   Spasm of lumbar paraspinous muscle 02/14/2019   Chronic pain syndrome 02/14/2019   Use  of cane as ambulatory aid 01/04/2019   Acneiform eruption 07/09/2017   Lumbar spondylosis 04/20/2017   Lumbar facet arthropathy 04/20/2017   Lumbar degenerative disc disease 04/20/2017   Anxiety 11/05/2016   Facet arthritis of lumbar region 09/16/2016   Low back pain 07/25/2016   Morbid obesity with BMI of 40.0-44.9, adult (HCC) 06/03/2016   Acquired hypothyroidism 03/04/2016   Cognitive deficit as late effect of traumatic brain injury (HCC) 03/04/2016   Health care maintenance 03/04/2016   HTN, goal below 140/90 03/04/2016   Hypokalemia 03/04/2016   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   Localized primary  osteoarthritis of lower leg, right 08/21/2015   Primary osteoarthritis of left knee 04/10/2015   Right knee pain 01/18/2013    Palliative Care Assessment & Plan   HPI: [name] is a 78 y/o F with a PMH significant for chronic diastolic heart failure, DVT on Eliquis, suspected OHS, morbid obesity, and chronic hypoxic respiratory failure on 3 L O2 by [place] who presented to [facility] for hematuria and rectal bleeding with hospital course c/b acute renal failure. Family meeting held on 8/19 and goals are geared towards comfort. The PMT is involved to offer ongoing support of symptoms.   Assessment: Symptom check today. Patient remains on fentanyl  infusion at 50 mcg. No boluses overnight or this AM.  She appears comfortable on my exam - no signs/symptoms of pain. Breathing is unlabored. She occasionally follows commands - lightly squeezes hand to request.  Attempts to speak but no intelligible speech produced.  No family at bedside.  Recommendations/Plan: Continue comfort measures only including fentanyl  infusion at 50 mcg with boluses prn  Symptom Management:  Tylenol  650 mg PR every 6 hours as needed fever Artificial tears 1 drop OU 4 times daily as needed dry eyes Fentanyl  infusion 0 to 400 mcg/h titrate per instructions Fentanyl  bolus via infusion 100 mcg IV every 15 minutes as needed uncontrolled pain, distress, RR greater than 18 Robinul  0.2 mg IV every 4 hours as needed excessive secretions Zofran  4 mg IV every 6 hours as needed nausea or vomiting  Goals of Care and Additional Recommendations: Limitations on Scope of Treatment: Full Comfort Care  Code Status: DNR  Prognosis:  Hours - Days  Discharge Planning: Anticipated Hospital Death  Thank you for allowing the Palliative Medicine Team to assist in the care of this patient.   Total Time 25 minutes Prolonged Time Billed  no   Time spent includes: Detailed review of medical records (labs, imaging, vital signs), medically  appropriate exam, discussion with treatment team, counseling and educating patient, family and/or staff, documenting clinical information, medication management and coordination of care.     *Please note that this is a verbal dictation therefore any spelling or grammatical errors are due to the Dragon Medical One system interpretation.  Robin Jama Barnacle, DNP, [facility] Palliative Medicine Team Team Phone # [phone]  Pager [phone]

## 2024-02-05 NOTE — Progress Notes (Signed)
 PROGRESS NOTE  Siyona Coto Hollifield [name]  DOB: 1945-08-29  PCP: Lenon Layman ORN, MD FMW:[id number]  DOA: 01/27/2024  LOS: 8 days  Hospital Day: 10  Brief narrative: [name] is a 78 y.o. female with PMH significant for morbid obesity, OSA, HTN, diastolic CHF, DVT on Eliquis, chronic respiratory failure on 3 L oxygen, recently hospitalized for E. coli bacteremia and discharged to rehab. 8/14, patient presented to ED with complaint of hematuria, rectal bleeding Admitted to [facility] Her course was complicated by acute kidney failure, uremic encephalopathy, hypertension for which she required transfer to ICU and initiation of vasopressors and CRRT. 8/19, palliative care was consulted.  Family made a decision to switch to comfort care. Currently on comfort care status.  Subjective: Patient was seen and examined this morning. Elderly Caucasian female.  Deeply somnolent on IV fentanyl  infusion.  On comfort care. Chart reviewed. Blood pressure 90s  Assessment and plan: Comfort care status Initially admitted for hematuria, rectal bleeding.   Hospital course complicated as mentioned above Currently on comfort care status Comfort care order sets in place In-hospital mortality anticipated  Goals of care   Code Status: Do not attempt resuscitation (DNR) - Comfort care   Fluid: IV fentanyl  infusion Consultants: Palliative care Family Communication: No family at bedside  Status: Inpatient Level of care:  Med-Surg   Patient is from: Home Needs to continue in-hospital care: In-hospital mortality anticipated   Diet:  Diet Order             Diet NPO time specified  Diet effective now                   Scheduled Meds:  Chlorhexidine  Gluconate Cloth  6 each Topical Daily   Gerhardt's butt cream   Topical TID    PRN meds: acetaminophen  **OR** acetaminophen , artificial tears, fentaNYL , glycopyrrolate  **OR** glycopyrrolate  **OR** glycopyrrolate ,  ondansetron  (ZOFRAN ) IV, mouth rinse   Infusions:   fentaNYL  infusion INTRAVENOUS 50 mcg/hr (02/05/24 0256)    Antimicrobials: Anti-infectives (From admission, onward)    Start     Dose/Rate Route Frequency Ordered Stop   02/01/24 1400  piperacillin -tazobactam (ZOSYN ) IVPB 2.25 g  Status:  Discontinued        2.25 g 100 mL/hr over 30 Minutes Intravenous Every 8 hours 02/01/24 0906 02/01/24 1417   02/01/24 1039  vancomycin  variable dose per unstable renal function (pharmacist dosing)  Status:  Discontinued         Does not apply See admin instructions 02/01/24 1039 02/01/24 1417   02/01/24 1000  vancomycin  (VANCOREADY) IVPB 2000 mg/400 mL        2,000 mg 200 mL/hr over 120 Minutes Intravenous  Once 02/01/24 0906 02/01/24 1213   01/30/24 1500  cefTRIAXone  (ROCEPHIN ) 2 g in sodium chloride  0.9 % 100 mL IVPB  Status:  Discontinued        2 g 200 mL/hr over 30 Minutes Intravenous Every 24 hours 01/30/24 0958 02/01/24 0859   01/29/24 1700  ceFEPIme  (MAXIPIME ) 2 g in sodium chloride  0.9 % 100 mL IVPB  Status:  Discontinued        2 g 200 mL/hr over 30 Minutes Intravenous Every 12 hours 01/29/24 1232 01/30/24 0958   01/28/24 0400  linezolid  (ZYVOX ) IVPB 600 mg  Status:  Discontinued        600 mg 300 mL/hr over 60 Minutes Intravenous Every 12 hours 01/28/24 0326 01/29/24 0902   01/28/24 0400  ceFEPIme  (MAXIPIME ) 2 g in sodium  chloride 0.9 % 100 mL IVPB  Status:  Discontinued        2 g 200 mL/hr over 30 Minutes Intravenous Every 24 hours 01/28/24 0332 01/29/24 1232       Objective: Vitals:   02/04/24 1146 02/04/24 1956  BP: (!) 120/51 (!) 99/49  Pulse: (!) 112 81  Resp:  (!) 22  Temp: 100.2 F (37.9 C) 98 F (36.7 C)  SpO2: (!) 71% 97%    Intake/Output Summary (Last 24 hours) at 02/05/2024 0812 Last data filed at 02/05/2024 0441 Gross per 24 hour  Intake 121.77 ml  Output 250 ml  Net -128.23 ml   Filed Weights   02/01/24 0530 02/02/24 0500 02/05/24 0500  Weight: 131.1 kg  133.9 kg 132.2 kg   Weight change:  Body mass index is 47.04 kg/m.   Physical Exam: General exam: Comfortable. Did not do digital examination because of comfort care status.  Data Review: I have personally reviewed the laboratory data and studies available.    Signed, Chapman Rota, MD [facility] 02/05/2024

## 2024-02-06 DIAGNOSIS — R0602 Shortness of breath: Secondary | ICD-10-CM

## 2024-02-06 DIAGNOSIS — R4182 Altered mental status, unspecified: Secondary | ICD-10-CM | POA: Diagnosis not present

## 2024-02-06 DIAGNOSIS — N170 Acute kidney failure with tubular necrosis: Secondary | ICD-10-CM | POA: Diagnosis not present

## 2024-02-06 DIAGNOSIS — K922 Gastrointestinal hemorrhage, unspecified: Secondary | ICD-10-CM | POA: Diagnosis not present

## 2024-02-06 DIAGNOSIS — N179 Acute kidney failure, unspecified: Secondary | ICD-10-CM | POA: Diagnosis not present

## 2024-02-06 DIAGNOSIS — R451 Restlessness and agitation: Secondary | ICD-10-CM

## 2024-02-06 LAB — CULTURE, BLOOD (ROUTINE X 2)
Culture: NO GROWTH
Culture: NO GROWTH

## 2024-02-06 MED ORDER — GERHARDT'S BUTT CREAM
1.0000 | TOPICAL_CREAM | CUTANEOUS | Status: DC | PRN
  Filled 2024-02-06: qty 60

## 2024-02-06 NOTE — Progress Notes (Signed)
 Daily Progress Note   Patient Name: [name]       Date: 02/06/2024 DOB: [birth year]  Age: 78 y.o. MRN#: [id number] Attending Physician: Arlice Reichert, MD Primary Care Physician: Lenon Layman ORN, MD Admit Date: 01/27/2024  Reason for Consultation/Follow-up: {Reason for Consult:23484}  Subjective: I have reviewed medical records including EPIC notes, MAR, any available advanced directives as necessary, and labs. Received report from primary RN - ***  All questions and concerns addressed. Encouraged to call with questions and/or concerns. PMT card provided.  Length of Stay: 9  Current Medications: Scheduled Meds:    Continuous Infusions:  fentaNYL  infusion INTRAVENOUS 50 mcg/hr (02/05/24 1814)    PRN Meds: acetaminophen  **OR** acetaminophen , artificial tears, fentaNYL , Gerhardt's butt cream, glycopyrrolate  **OR** glycopyrrolate  **OR** glycopyrrolate , ondansetron  (ZOFRAN ) IV, mouth rinse  Physical Exam          Vital Signs: BP (!) 88/45 (BP Location: Left Leg)   Pulse 92   Temp 98 F (36.7 C)   Resp (!) 5   Ht 5' 6 (1.676 m)   Wt 132.2 kg   SpO2 (!) 87%   BMI 47.04 kg/m  SpO2: SpO2: (!) 87 % O2 Device: O2 Device: Room Air O2 Flow Rate: O2 Flow Rate (L/min): (S) 6 L/min  Intake/output summary: No intake or output data in the 24 hours ending 02/06/24 0829 LBM: Last BM Date : 02/04/24 Baseline Weight: Weight: 131.5 kg Most recent weight: Weight: 132.2 kg       Palliative Assessment/Data:      Patient Active Problem List   Diagnosis Date Noted   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   Acute hypoxic respiratory failure (HCC) 01/29/2024   Acute GI bleeding 01/29/2024   Anemia 01/28/2024   Leucocytosis 01/28/2024   Acute encephalopathy 01/28/2024    Bleeding 01/28/2024   History of DVT (deep vein thrombosis) 01/28/2024   Altered mental status 01/28/2024   Contusion of left foot 03/27/2023   Closed fracture of distal end of right fibula with routine healing 03/23/2023   Fibula fracture 03/23/2023   ARF (acute renal failure) (HCC) 10/27/2022   Weakness 10/27/2022   Venous stasis 10/27/2022   Community acquired pneumonia 07/03/2020   OSA (obstructive sleep apnea) 07/03/2020   CAP (community acquired pneumonia) 07/03/2020   Sepsis (HCC) 07/02/2020   History of anesthesia reaction 02/20/2020  Swelling of limb 02/20/2020   Chronic diastolic CHF (congestive heart failure) (HCC) 08/22/2019   Edema 07/29/2019   Prediabetes 07/07/2019   Depression 04/26/2019   Disease of thyroid gland 04/26/2019   GERD (gastroesophageal reflux disease) 04/26/2019   History of head injury 04/26/2019   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/26/2019   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   Spasm of lumbar paraspinous muscle 02/14/2019   Chronic pain syndrome 02/14/2019   Use of cane as ambulatory aid 01/04/2019   Acneiform eruption 07/09/2017   Lumbar spondylosis 04/20/2017   Lumbar facet arthropathy 04/20/2017   Lumbar degenerative disc disease 04/20/2017   Anxiety 11/05/2016   Facet arthritis of lumbar region 09/16/2016   Low back pain 07/25/2016   Morbid obesity with BMI of 40.0-44.9, adult (HCC) 06/03/2016   Acquired hypothyroidism 03/04/2016   Cognitive deficit as late effect of traumatic brain injury (HCC) 03/04/2016   Health care maintenance 03/04/2016   HTN, goal below 140/90 03/04/2016   Hypokalemia 03/04/2016   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   Localized primary osteoarthritis of lower leg, right 08/21/2015   Primary osteoarthritis of left knee 04/10/2015   Right knee pain 01/18/2013    Palliative Care Assessment & Plan   Patient Profile: ***  Assessment: Principal Problem:   ARF (acute renal failure) (HCC) Active  Problems:   Acquired hypothyroidism   Chronic diastolic CHF (congestive heart failure) (HCC)   Cognitive deficit as late effect of traumatic brain injury (HCC)   HTN, goal below 140/90   Morbid obesity with BMI of 40.0-44.9, adult (HCC)   OSA (obstructive sleep apnea)   Anemia   Leucocytosis   Acute encephalopathy   Bleeding   History of DVT (deep vein thrombosis)   Altered mental status   Uremic encephalopathy   Acute hypoxic respiratory failure (HCC)   Acute GI bleeding   Recommendations/Plan: ***  Goals of Care and Additional Recommendations: Limitations on Scope of Treatment: {Recommended Scope and Preferences:21019}  Code Status:    Code Status Orders  (From admission, onward)           Start     Ordered   02/02/24 0740  Do not attempt resuscitation (DNR) - Comfort care  (Code Status)  Continuous       Question Answer Comment  If patient has no pulse and is not breathing Do Not Attempt Resuscitation   In Pre-Arrest Conditions (Patient Is Breathing and Has a Pulse) Provide comfort measures. Relieve any mechanical airway obstruction. Avoid transfer unless required for comfort.   Consent: Discussion documented in EHR or advanced directives reviewed      02/02/24 0740           Code Status History     Date Active Date Inactive Code Status Order ID Comments User Context   02/01/2024 1444 02/02/2024 0740 Do not attempt resuscitation (DNR) PRE-ARREST INTERVENTIONS DESIRED [id number]  Renne Homans, MD Inpatient   02/01/2024 1443 02/01/2024 1444 Do not attempt resuscitation (DNR) - Comfort care [id number]  Zaida Zola SAILOR, MD Inpatient   01/29/2024 1404 02/01/2024 1443 Limited: Do not attempt resuscitation (DNR) -DNR-LIMITED -Do Not Intubate/DNI  [id number]  Catherine Cools, MD Inpatient   01/28/2024 0325 01/29/2024 1404 Full Code [id number]  Franky Redia SAILOR, MD ED   03/22/2023 1423 03/29/2023 2033 Full Code [id number]  Marca Maude POUR, MD ED   10/27/2022 1449  11/02/2022 2025 Full Code [id number]  Eldonna Elspeth PARAS, MD ED   07/02/2020 2255 07/06/2020 2055 Full Code [id number]  Ricky Pax  T, DO ED       Prognosis:  {Palliative Care Prognosis:23504}  Discharge Planning: {Palliative dispostion:23505}  Care plan was discussed with ***  Thank you for allowing the Palliative Medicine Team to assist in the care of this patient.   Time In: *** Time Out: *** Total Time *** Prolonged Time Billed  {YES WN:77650}       Jeoffrey CHRISTELLA Sharps, NP  Please contact Palliative Medicine Team phone at [phone] for questions and concerns.   *Portions of this note are a verbal dictation therefore any spelling and/or grammatical errors are due to the Dragon Medical One system interpretation.

## 2024-02-06 NOTE — Plan of Care (Signed)
  Problem: Pain Managment: Goal: General experience of comfort will improve and/or be controlled Outcome: Progressing   Problem: Safety: Goal: Ability to remain free from injury will improve Outcome: Progressing   Problem: Skin Integrity: Goal: Risk for impaired skin integrity will decrease Outcome: Progressing

## 2024-02-06 NOTE — Progress Notes (Signed)
 PROGRESS NOTE  [name]  DOB: 12/22/1945  PCP: Lenon Layman ORN, MD FMW:[id number]  DOA: 01/27/2024  LOS: 9 days  Hospital Day: 11  Brief narrative: [name] is a 78 y.o. female with PMH significant for morbid obesity, OSA, HTN, diastolic CHF, DVT on Eliquis, chronic respiratory failure on 3 L oxygen, recently hospitalized for E. coli bacteremia and discharged to rehab. 8/14, patient presented to ED with complaint of hematuria, rectal bleeding Admitted to [facility] Her course was complicated by acute kidney failure, uremic encephalopathy, hypertension for which she required transfer to ICU and initiation of vasopressors and CRRT. 8/19, palliative care was consulted.  Family made a decision to switch to comfort care. Currently on comfort care status.  Subjective: Patient was seen and examined this morning. Remains on fentanyl  drip.  Family not at bedside.  Comfortable Blood pressure in 80s  Assessment and plan: Comfort care status Initially admitted for hematuria, rectal bleeding.   Hospital course complicated as mentioned above Currently on comfort care status Comfort care order sets in place In-hospital mortality anticipated  Goals of care   Code Status: Do not attempt resuscitation (DNR) - Comfort care   Fluid: IV fentanyl  infusion Consultants: Palliative care Family Communication: No family at bedside  Status: Inpatient Level of care:  Med-Surg   Patient is from: Home Needs to continue in-hospital care: In-hospital mortality anticipated   Diet:  Diet Order             Diet NPO time specified  Diet effective now                   Scheduled Meds:    PRN meds: acetaminophen  **OR** acetaminophen , artificial tears, fentaNYL , Gerhardt's butt cream, glycopyrrolate  **OR** glycopyrrolate  **OR** glycopyrrolate , ondansetron  (ZOFRAN ) IV, mouth rinse   Infusions:   fentaNYL  infusion INTRAVENOUS 50 mcg/hr (02/05/24 1814)     Antimicrobials: Anti-infectives (From admission, onward)    Start     Dose/Rate Route Frequency Ordered Stop   02/01/24 1400  piperacillin -tazobactam (ZOSYN ) IVPB 2.25 g  Status:  Discontinued        2.25 g 100 mL/hr over 30 Minutes Intravenous Every 8 hours 02/01/24 0906 02/01/24 1417   02/01/24 1039  vancomycin  variable dose per unstable renal function (pharmacist dosing)  Status:  Discontinued         Does not apply See admin instructions 02/01/24 1039 02/01/24 1417   02/01/24 1000  vancomycin  (VANCOREADY) IVPB 2000 mg/400 mL        2,000 mg 200 mL/hr over 120 Minutes Intravenous  Once 02/01/24 0906 02/01/24 1213   01/30/24 1500  cefTRIAXone  (ROCEPHIN ) 2 g in sodium chloride  0.9 % 100 mL IVPB  Status:  Discontinued        2 g 200 mL/hr over 30 Minutes Intravenous Every 24 hours 01/30/24 0958 02/01/24 0859   01/29/24 1700  ceFEPIme  (MAXIPIME ) 2 g in sodium chloride  0.9 % 100 mL IVPB  Status:  Discontinued        2 g 200 mL/hr over 30 Minutes Intravenous Every 12 hours 01/29/24 1232 01/30/24 0958   01/28/24 0400  linezolid  (ZYVOX ) IVPB 600 mg  Status:  Discontinued        600 mg 300 mL/hr over 60 Minutes Intravenous Every 12 hours 01/28/24 0326 01/29/24 0902   01/28/24 0400  ceFEPIme  (MAXIPIME ) 2 g in sodium chloride  0.9 % 100 mL IVPB  Status:  Discontinued        2 g  200 mL/hr over 30 Minutes Intravenous Every 24 hours 01/28/24 0332 01/29/24 1232       Objective: Vitals:   02/05/24 2030 02/06/24 0752  BP: (!) 109/54 (!) 88/45  Pulse: 83 92  Resp: 16 (!) 5  Temp: 98.1 F (36.7 C) 98 F (36.7 C)  SpO2: 92% (!) 87%   No intake or output data in the 24 hours ending 02/06/24 1522  Filed Weights   02/02/24 0500 02/05/24 0500 02/06/24 0432  Weight: 133.9 kg 132.2 kg 132.2 kg   Weight change: 0 kg Body mass index is 47.04 kg/m.   Physical Exam: General exam: Comfortable. Did not do digital examination because of comfort care status.  Data Review: I have personally  reviewed the laboratory data and studies available.    Signed, Chapman Rota, MD [facility] 02/06/2024

## 2024-02-07 DIAGNOSIS — N170 Acute kidney failure with tubular necrosis: Secondary | ICD-10-CM | POA: Diagnosis not present

## 2024-02-07 DIAGNOSIS — N179 Acute kidney failure, unspecified: Secondary | ICD-10-CM | POA: Diagnosis not present

## 2024-02-07 DIAGNOSIS — K922 Gastrointestinal hemorrhage, unspecified: Secondary | ICD-10-CM | POA: Diagnosis not present

## 2024-02-07 DIAGNOSIS — R4182 Altered mental status, unspecified: Secondary | ICD-10-CM | POA: Diagnosis not present

## 2024-02-07 MED ORDER — DIPHENHYDRAMINE-ZINC ACETATE 2-0.1 % EX CREA
1.0000 | TOPICAL_CREAM | Freq: Three times a day (TID) | CUTANEOUS | Status: DC | PRN
  Filled 2024-02-07: qty 28

## 2024-02-07 NOTE — Progress Notes (Signed)
 PROGRESS NOTE  [name]  DOB: 1945-11-24  PCP: Lenon Layman ORN, MD FMW:[id number]  DOA: 01/27/2024  LOS: 10 days  Hospital Day: 12  Brief narrative: [name] is a 78 y.o. female with PMH significant for morbid obesity, OSA, HTN, diastolic CHF, DVT on Eliquis, chronic respiratory failure on 3 L oxygen, recently hospitalized for E. coli bacteremia and discharged to rehab. 8/14, patient presented to ED with complaint of hematuria, rectal bleeding Admitted to [facility] Her course was complicated by acute kidney failure, uremic encephalopathy, hypertension for which she required transfer to ICU and initiation of vasopressors and CRRT. 8/19, palliative care was consulted.  Family made a decision to switch to comfort care. Currently on comfort care status.  Subjective: Patient was seen and examined this morning. Today, patient was alert, awake, uncomfortable looking and was fidgeting with her gown.  I reached out to RN to use pain meds as needed. Not at bedside.  Assessment and plan: Comfort care status Initially admitted for hematuria, rectal bleeding.   Hospital course complicated as mentioned above Currently on comfort care status Comfort care order sets in place In-hospital mortality was anticipated but it seems it will take longer than expected.  May need to pursue residential hospice placement. I reached out to palliative care as well as bedside RN  Goals of care   Code Status: Do not attempt resuscitation (DNR) - Comfort care   Fluid: IV fentanyl  infusion Consultants: Palliative care Family Communication: No family at bedside  Status: Inpatient Level of care:  Med-Surg   Patient is from: Home Needs to continue in-hospital care: May need residential hospice placement   Diet:  Diet Order             Diet NPO time specified  Diet effective now                   Scheduled Meds:    PRN meds: acetaminophen  **OR**  acetaminophen , artificial tears, diphenhydrAMINE -zinc  acetate, fentaNYL , Gerhardt's butt cream, glycopyrrolate  **OR** glycopyrrolate  **OR** glycopyrrolate , ondansetron  (ZOFRAN ) IV, mouth rinse   Infusions:   fentaNYL  infusion INTRAVENOUS 50 mcg/hr (02/05/24 1814)    Antimicrobials: Anti-infectives (From admission, onward)    Start     Dose/Rate Route Frequency Ordered Stop   02/01/24 1400  piperacillin -tazobactam (ZOSYN ) IVPB 2.25 g  Status:  Discontinued        2.25 g 100 mL/hr over 30 Minutes Intravenous Every 8 hours 02/01/24 0906 02/01/24 1417   02/01/24 1039  vancomycin  variable dose per unstable renal function (pharmacist dosing)  Status:  Discontinued         Does not apply See admin instructions 02/01/24 1039 02/01/24 1417   02/01/24 1000  vancomycin  (VANCOREADY) IVPB 2000 mg/400 mL        2,000 mg 200 mL/hr over 120 Minutes Intravenous  Once 02/01/24 0906 02/01/24 1213   01/30/24 1500  cefTRIAXone  (ROCEPHIN ) 2 g in sodium chloride  0.9 % 100 mL IVPB  Status:  Discontinued        2 g 200 mL/hr over 30 Minutes Intravenous Every 24 hours 01/30/24 0958 02/01/24 0859   01/29/24 1700  ceFEPIme  (MAXIPIME ) 2 g in sodium chloride  0.9 % 100 mL IVPB  Status:  Discontinued        2 g 200 mL/hr over 30 Minutes Intravenous Every 12 hours 01/29/24 1232 01/30/24 0958   01/28/24 0400  linezolid  (ZYVOX ) IVPB 600 mg  Status:  Discontinued  600 mg 300 mL/hr over 60 Minutes Intravenous Every 12 hours 01/28/24 0326 01/29/24 0902   01/28/24 0400  ceFEPIme  (MAXIPIME ) 2 g in sodium chloride  0.9 % 100 mL IVPB  Status:  Discontinued        2 g 200 mL/hr over 30 Minutes Intravenous Every 24 hours 01/28/24 0332 01/29/24 1232       Objective: Vitals:   02/06/24 0752 02/06/24 2038  BP: (!) 88/45 (!) 94/50  Pulse: 92 91  Resp: (!) 5 16  Temp: 98 F (36.7 C) 98 F (36.7 C)  SpO2: (!) 87% 94%    Intake/Output Summary (Last 24 hours) at 02/07/2024 1003 Last data filed at 02/07/2024  9347 Gross per 24 hour  Intake --  Output 52 ml  Net -52 ml    Filed Weights   02/05/24 0500 02/06/24 0432 02/07/24 0500  Weight: 132.2 kg 132.2 kg 132.2 kg   Weight change: 0 kg Body mass index is 47.04 kg/m.   Physical Exam: General exam: Seems a little uncomfortable today. Did not do digital examination because of comfort care status.  Data Review: I have personally reviewed the laboratory data and studies available.    Signed, Chapman Rota, MD [facility] 02/07/2024

## 2024-02-07 NOTE — Progress Notes (Signed)
 [id number] Liaison Note:   Notified by provider that patient has been transitioned to comfort care and would like assessment for [facility].   Patient will be assessed tomorrow. Team notified via [employer].    Thank you for the opportunity to participate in this patient's care.    Eleanor Nail, LPN Hospice Nurse Liaison [phone]

## 2024-02-07 NOTE — Plan of Care (Signed)
  Problem: Pain Managment: Goal: General experience of comfort will improve and/or be controlled Outcome: Progressing   Problem: Safety: Goal: Ability to remain free from injury will improve Outcome: Progressing   Problem: Skin Integrity: Goal: Risk for impaired skin integrity will decrease Outcome: Progressing

## 2024-02-07 NOTE — TOC Progression Note (Addendum)
 Transition of Care [facility]) - Progression Note    Patient Details  Name: [name] MRN: [id number] Date of Birth: 1946-06-07  Transition of Care [facility] CM/SW Contact  Rosaline JONELLE Joe, RN Phone Number: 02/07/2024, 10:40 AM  Clinical Narrative:    CM noted that [facility] consult was placed to discuss possible INpatient Hospice placement with the family.  Family are not present at the bedside at this time.  I called the patient's spouse and was unable to leave a voicemail.  I called and left a detailed message with the patient's daughter by phone.  Patient is currently on RA.  Fentanyl  drip is infusing to right neck CVL at this time.  CM will continue to follow the patient and will follow up with family today to discuss possibility of inpatient hospice placement if family is agreeable.  02/07/24 - I called and spoke with Dedra Dawn and offered choice regarding inpatient hospice placement.  Dawn states that she will speak with the family to determine which Hospice provider and location for inpatient services.  Dawn states that she will return my call tomorrow with her decision.   Expected Discharge Plan: Long Term Nursing Home Barriers to Discharge: Continued Medical Work up               Expected Discharge Plan and Services In-house Referral: Clinical Social Work   Post Acute Care Choice: Skilled Nursing Facility Living arrangements for the past 2 months: Skilled Nursing Facility                                       Social Drivers of Health (SDOH) Interventions SDOH Screenings   Food Insecurity: Patient Unable To Answer (01/29/2024)  Housing: Patient Unable To Answer (01/29/2024)  Transportation Needs: Patient Unable To Answer (01/29/2024)  Utilities: Patient Unable To Answer (01/29/2024)  Depression (PHQ2-9): Low Risk  (11/11/2022)  Social Connections: Patient Unable To Answer (01/29/2024)  Tobacco Use: Low Risk  (01/28/2024)    Readmission Risk  Interventions     No data to display

## 2024-02-07 NOTE — Progress Notes (Signed)
 Daily Progress Note   Patient Name: [name]       Date: 02/07/2024 DOB: 06/10/1946  Age: 78 y.o. MRN#: [id number] Attending Physician: Arlice Reichert, MD Primary Care Physician: Lenon Layman ORN, MD Admit Date: 01/27/2024  Reason for Consultation/Follow-up: {Reason for Consult:23484}  Subjective: I have reviewed medical records including EPIC notes, MAR, any available advanced directives as necessary, and labs. Received report from primary RN - ***  All questions and concerns addressed. Encouraged to call with questions and/or concerns. PMT card provided.  Length of Stay: 10  Current Medications: Scheduled Meds:    Continuous Infusions:  fentaNYL  infusion INTRAVENOUS 50 mcg/hr (02/05/24 1814)    PRN Meds: acetaminophen  **OR** acetaminophen , artificial tears, diphenhydrAMINE -zinc  acetate, fentaNYL , Gerhardt's butt cream, glycopyrrolate  **OR** glycopyrrolate  **OR** glycopyrrolate , ondansetron  (ZOFRAN ) IV, mouth rinse  Physical Exam          Vital Signs: BP (!) 94/50 (BP Location: Left Leg)   Pulse 91   Temp 98 F (36.7 C)   Resp 16   Ht 5' 6 (1.676 m)   Wt 132.2 kg   SpO2 94%   BMI 47.04 kg/m  SpO2: SpO2: 94 % O2 Device: O2 Device: Room Air O2 Flow Rate: O2 Flow Rate (L/min): (S) 6 L/min  Intake/output summary:  Intake/Output Summary (Last 24 hours) at 02/07/2024 0936 Last data filed at 02/07/2024 9347 Gross per 24 hour  Intake --  Output 52 ml  Net -52 ml   LBM: Last BM Date : 02/04/24 Baseline Weight: Weight: 131.5 kg Most recent weight: Weight: 132.2 kg       Palliative Assessment/Data:      Patient Active Problem List   Diagnosis Date Noted   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   Acute hypoxic respiratory failure (HCC) 01/29/2024    Acute GI bleeding 01/29/2024   Anemia 01/28/2024   Leucocytosis 01/28/2024   Acute encephalopathy 01/28/2024   Bleeding 01/28/2024   History of DVT (deep vein thrombosis) 01/28/2024   Altered mental status 01/28/2024   Contusion of left foot 03/27/2023   Closed fracture of distal end of right fibula with routine healing 03/23/2023   Fibula fracture 03/23/2023   ARF (acute renal failure) (HCC) 10/27/2022   Weakness 10/27/2022   Venous stasis 10/27/2022   Community acquired pneumonia 07/03/2020   OSA (obstructive sleep apnea) 07/03/2020  CAP (community acquired pneumonia) 07/03/2020   Sepsis (HCC) 07/02/2020   History of anesthesia reaction 02/20/2020   Swelling of limb 02/20/2020   Chronic diastolic CHF (congestive heart failure) (HCC) 08/22/2019   Edema 07/29/2019   Prediabetes 07/07/2019   Depression 04/26/2019   Disease of thyroid gland 04/26/2019   GERD (gastroesophageal reflux disease) 04/26/2019   History of head injury 04/26/2019   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/26/2019   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   Spasm of lumbar paraspinous muscle 02/14/2019   Chronic pain syndrome 02/14/2019   Use of cane as ambulatory aid 01/04/2019   Acneiform eruption 07/09/2017   Lumbar spondylosis 04/20/2017   Lumbar facet arthropathy 04/20/2017   Lumbar degenerative disc disease 04/20/2017   Anxiety 11/05/2016   Facet arthritis of lumbar region 09/16/2016   Low back pain 07/25/2016   Morbid obesity with BMI of 40.0-44.9, adult (HCC) 06/03/2016   Acquired hypothyroidism 03/04/2016   Cognitive deficit as late effect of traumatic brain injury (HCC) 03/04/2016   Health care maintenance 03/04/2016   HTN, goal below 140/90 03/04/2016   Hypokalemia 03/04/2016   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   Localized primary osteoarthritis of lower leg, right 08/21/2015   Primary osteoarthritis of left knee 04/10/2015   Right knee pain 01/18/2013    Palliative Care Assessment &  Plan   Patient Profile: ***  Assessment: Principal Problem:   ARF (acute renal failure) (HCC) Active Problems:   Acquired hypothyroidism   Chronic diastolic CHF (congestive heart failure) (HCC)   Cognitive deficit as late effect of traumatic brain injury (HCC)   HTN, goal below 140/90   Morbid obesity with BMI of 40.0-44.9, adult (HCC)   OSA (obstructive sleep apnea)   Anemia   Leucocytosis   Acute encephalopathy   Bleeding   History of DVT (deep vein thrombosis)   Altered mental status   Uremic encephalopathy   Acute hypoxic respiratory failure (HCC)   Acute GI bleeding   Recommendations/Plan: Continue DNR/DNI as previously documented - durable DNR form completed and placed in shadow chart. Copy was made and will be scanned into Vynca/ACP tab   Goals of Care and Additional Recommendations: Limitations on Scope of Treatment: {Recommended Scope and Preferences:21019}  Code Status:    Code Status Orders  (From admission, onward)           Start     Ordered   02/02/24 0740  Do not attempt resuscitation (DNR) - Comfort care  (Code Status)  Continuous       Question Answer Comment  If patient has no pulse and is not breathing Do Not Attempt Resuscitation   In Pre-Arrest Conditions (Patient Is Breathing and Has a Pulse) Provide comfort measures. Relieve any mechanical airway obstruction. Avoid transfer unless required for comfort.   Consent: Discussion documented in EHR or advanced directives reviewed      02/02/24 0740           Code Status History     Date Active Date Inactive Code Status Order ID Comments User Context   02/01/2024 1444 02/02/2024 0740 Do not attempt resuscitation (DNR) PRE-ARREST INTERVENTIONS DESIRED [id number]  Renne Homans, MD Inpatient   02/01/2024 1443 02/01/2024 1444 Do not attempt resuscitation (DNR) - Comfort care [id number]  Zaida Zola SAILOR, MD Inpatient   01/29/2024 1404 02/01/2024 1443 Limited: Do not attempt resuscitation (DNR)  -DNR-LIMITED -Do Not Intubate/DNI  [id number]  Catherine Cools, MD Inpatient   01/28/2024 0325 01/29/2024 1404 Full Code [id number]  Franky Redia SAILOR,  MD ED   03/22/2023 1423 03/29/2023 2033 Full Code [id number]  Marca Maude POUR, MD ED   10/27/2022 1449 11/02/2022 2025 Full Code [id number]  Eldonna Elspeth PARAS, MD ED   07/02/2020 2255 07/06/2020 2055 Full Code [id number]  Ricky Alfrieda DASEN, DO ED       Prognosis:  {Palliative Care Prognosis:23504}  Discharge Planning: {Palliative dispostion:23505}  Care plan was discussed with ***  Thank you for allowing the Palliative Medicine Team to assist in the care of this patient.   Time In: *** Time Out: *** Total Time *** Prolonged Time Billed  {YES WN:77650}       Jeoffrey CHRISTELLA Sharps, NP  Please contact Palliative Medicine Team phone at [phone] for questions and concerns.   *Portions of this note are a verbal dictation therefore any spelling and/or grammatical errors are due to the Dragon Medical One system interpretation.

## 2024-02-08 DIAGNOSIS — N170 Acute kidney failure with tubular necrosis: Secondary | ICD-10-CM | POA: Diagnosis not present

## 2024-02-08 DIAGNOSIS — Z515 Encounter for palliative care: Secondary | ICD-10-CM | POA: Diagnosis not present

## 2024-02-08 DIAGNOSIS — K922 Gastrointestinal hemorrhage, unspecified: Secondary | ICD-10-CM | POA: Diagnosis not present

## 2024-02-08 DIAGNOSIS — J9601 Acute respiratory failure with hypoxia: Secondary | ICD-10-CM | POA: Diagnosis not present

## 2024-02-08 MED ORDER — FENTANYL 2500MCG IN NS 250ML (10MCG/ML) PREMIX INFUSION: 0.0000 ug/h | INTRAVENOUS | Status: AC

## 2024-02-08 MED ORDER — ONDANSETRON HCL 4 MG/2ML IJ SOLN: 4.0000 mg | Freq: Four times a day (QID) | INTRAMUSCULAR | Status: AC | PRN

## 2024-02-08 MED ORDER — DIPHENHYDRAMINE-ZINC ACETATE 2-0.1 % EX CREA: 1.0000 | TOPICAL_CREAM | Freq: Three times a day (TID) | CUTANEOUS | Status: AC | PRN

## 2024-02-08 MED ORDER — ACETAMINOPHEN 325 MG PO TABS: 650.0000 mg | ORAL_TABLET | Freq: Four times a day (QID) | ORAL | Status: AC | PRN

## 2024-02-08 MED ORDER — FENTANYL BOLUS VIA INFUSION: 100.0000 ug | INTRAVENOUS | Status: AC | PRN

## 2024-02-08 MED ORDER — POLYVINYL ALCOHOL 1.4 % OP SOLN: 1.0000 [drp] | Freq: Four times a day (QID) | OPHTHALMIC | Status: AC | PRN

## 2024-02-08 MED ORDER — GERHARDT'S BUTT CREAM: 1.0000 | TOPICAL_CREAM | CUTANEOUS | Status: AC | PRN

## 2024-02-08 MED ORDER — GLYCOPYRROLATE 1 MG PO TABS: 1.0000 mg | ORAL_TABLET | ORAL | Status: AC | PRN

## 2024-02-08 NOTE — Progress Notes (Signed)
 DC order noted for hospice transfer. DC RN at bedside. AVS printed and set in transfer packet with DNR. Primary RN/charge RN notified.

## 2024-02-08 NOTE — Plan of Care (Signed)
  Problem: Pain Managment: Goal: General experience of comfort will improve and/or be controlled Outcome: Progressing   Problem: Safety: Goal: Ability to remain free from injury will improve Outcome: Progressing   Problem: Skin Integrity: Goal: Risk for impaired skin integrity will decrease Outcome: Progressing

## 2024-02-08 NOTE — Progress Notes (Incomplete)
 [facility] 2W32 [facility] Liaison Note  Received request from [facility] manager for family interest in [facility]. Eligibility confirmed. Met with patient and spoke to family to confirm interest and explain services. Family agreeable to transfer today. TOC aware. RN please call report to [phone] prior to patient leaving the unit. Please send signed DNR with patient at discharge.   Thank you for allowing us  to participate in this patient's care.  Eleanor Nail, LPN [facility] Liaison [phone]

## 2024-02-08 NOTE — TOC Progression Note (Signed)
 Transition of Care [facility]) - Progression Note    Patient Details  Name: JAEDYN MARRUFO [name] MRN: [id number] Date of Birth: May 22, 1946  Transition of [facility]) CM/SW Contact  Rosaline JONELLE Joe, RN Phone Number: 02/08/2024, 10:19 AM  Clinical Narrative:    CM met with the patient at the bedside alone with Eleanor Nail, CM with [facility] and she will review the patient for possible Inpatient Hospice placement at [facility] once reviewed today.   Expected Discharge Plan: Long Term Nursing Home Barriers to Discharge: Continued Medical Work up               Expected Discharge Plan and Services In-house Referral: Clinical Social Work   Post Acute Care Choice: Skilled Nursing Facility Living arrangements for the past 2 months: Skilled Nursing Facility                                       Social Drivers of Health (SDOH) Interventions SDOH Screenings   Food Insecurity: Patient Unable To Answer (01/29/2024)  Housing: Patient Unable To Answer (01/29/2024)  Transportation Needs: Patient Unable To Answer (01/29/2024)  Utilities: Patient Unable To Answer (01/29/2024)  Depression (PHQ2-9): Low Risk  (11/11/2022)  Social Connections: Patient Unable To Answer (01/29/2024)  Tobacco Use: Low Risk  (01/28/2024)    Readmission Risk Interventions     No data to display

## 2024-02-08 NOTE — Discharge Summary (Signed)
 Physician Discharge Summary  Tanja Gift [facility] [name] FMW:[id number] DOB: 07-16-1945 DOA: 01/27/2024  PCP: Lenon Layman ORN, MD  Admit date: 01/27/2024 Discharge date: 02/09/2024  Admitted from: Home Discharge disposition: Residential hospice  Brief narrative: CLAIRESSA BOULET [name] is a 78 y.o. female with PMH significant for morbid obesity, OSA, HTN, diastolic CHF, DVT on Eliquis, chronic respiratory failure on 3 L oxygen, recently hospitalized for E. coli bacteremia and discharged to rehab. 8/14, patient presented to ED with complaint of hematuria, rectal bleeding Admitted to [facility] Her course was complicated by acute kidney failure, uremic encephalopathy, hypertension for which she required transfer to ICU and initiation of vasopressors and CRRT. 8/19, palliative care was consulted.  Family made a decision to switch to comfort care. Currently on comfort care status.  Subjective: Patient was seen and examined this morning. Startles and tries to open eyes on calling her name.  Remains on fentanyl  drip  Assessment and plan: Comfort care status Initially admitted for hematuria, rectal bleeding.   Hospital course complicated as mentioned above Currently on comfort care status Comfort care order sets in place On fentanyl  drip currently. Preparations ongoing for residential hospice placement  Goals of care   Code Status: Do not attempt resuscitation (DNR) - Comfort care   Fluid: IV fentanyl  infusion Consultants: Palliative care Family Communication: No family at bedside    Diet:  Diet Order             Diet general           Diet NPO time specified  Diet effective now                   Nutritional status:  Body mass index is 46.42 kg/m.  Nutrition Problem: Inadequate oral intake Etiology: inability to eat Signs/Symptoms: NPO status  Wounds:  - Wound 01/29/24 Arm Lower;Posterior;Proximal;Right (Active)  Date First Assessed: 01/29/24   Location:  Arm  Location Orientation: Lower;Posterior;Proximal;Right    No assessment data to display     No associated orders.     Wound 01/29/24 1230 Pressure Injury Sacrum Stage 2 -  Partial thickness loss of dermis presenting as a shallow open injury with a red, pink wound bed without slough. (Active)  Date First Assessed/Time First Assessed: 01/29/24 1230   Present on Original Admission: Yes  Primary Wound Type: Pressure Injury  Location: Sacrum  Staging: Stage 2 -  Partial thickness loss of dermis presenting as a shallow open injury with a red, pi...    Assessments 01/29/2024 12:30 PM 02/08/2024 11:00 AM  Wound Image     Site / Wound Assessment Clean;Dry;Red;Pink Clean;Dry  Peri-wound Assessment -- Intact  Wound Length (cm) 1 cm --  Wound Width (cm) 1 cm --  Wound Surface Area (cm^2) 0.79 cm^2 --  Drainage Amount None --  Treatment Cleansed;Off loading --  Dressing Type Foam - Lift dressing to assess site every shift --  Dressing Changed Changed --  Dressing Status Clean, Dry, Intact --     No associated orders.     Wound 01/30/24 1544 Other (Comment) Elbow Left;Posterior (Active)  Date First Assessed/Time First Assessed: 01/30/24 1544   Present on Original Admission: No  Primary Wound Type: (c) Other (Comment)  Location: Elbow  Location Orientation: Left;Posterior    Assessments 01/30/2024  2:23 PM 02/08/2024 11:00 AM  Peri-wound Assessment -- Intact  Wound Length (cm) 4 cm --  Wound Width (cm) 4 cm --  Wound Surface Area (cm^2) 12.57 cm^2 --  Wound Depth (cm) 0 cm --  Wound Volume (cm^3) 0 cm^3 --  Drainage Description Serosanguineous Serosanguineous  Drainage Amount Scant Small     No associated orders.    Discharge Exam:   Vitals:   02/08/24 0500 02/08/24 0803 02/08/24 2020 02/09/24 0827  BP:  (!) 89/52 (!) 93/55 (!) 99/53  Pulse:  80 76 80  Resp:      Temp:  (!) 97.5 F (36.4 C)  (!) 97.3 F (36.3 C)  TempSrc:    Oral  SpO2:  97%  100%  Weight: 130.5 kg     Height:         Body mass index is 46.42 kg/m.  General exam: Seems a little uncomfortable today. Did not do digital examination because of comfort care status.  Follow ups:    Follow-up Information     Lenon Layman ORN, MD Follow up.   Specialty: Internal Medicine Contact information: [address] [facility] [place] [address] [phone]                 Discharge Instructions:   Discharge Instructions     Activity as tolerated - No restrictions   Complete by: As directed    Call MD for:   Complete by: As directed    Please get in touch with hospice MD/nurse for any symptom control.   Diet general   Complete by: As directed    If patient is alert and awake enough to eat, can allow luxury feeding.   Discharge instructions   Complete by: As directed    Medicines intended for comfort and pain management prescribed. Rest of the care per hospice policy.   Discharge wound care:   Complete by: As directed        Discharge Medications:   Allergies as of 02/09/2024       Reactions   Codeine Other (See Comments)   Seizure. Tolerates tramadol    Povidone Iodine Shortness Of Breath   Patient had difficulty breathing during procedure where dura prep was used to clean back.    Hydromorphone Hives, Itching, Other (See Comments)   Severe itching hallucinations   Latex Itching, Other (See Comments)   blisters   Oxycodone-acetaminophen  Nausea And Vomiting, Itching   Sulfa Antibiotics Itching, Swelling   Swelling eyes, hands, feet. Was at home and didn't go to the hospital   Bupropion Other (See Comments)   Unknown reaction   Gabapentin Other (See Comments)   tremors   Hydrocodone    Tolerates tramadol    Morphine  And Codeine Itching, Rash   Procaine         Medication List     STOP taking these medications    Amantadine  HCl 100 MG tablet   amLODipine  5 MG tablet Commonly known as: NORVASC    ascorbic acid  500 MG tablet Commonly  known as: VITAMIN C    baclofen  20 MG tablet Commonly known as: LIORESAL    bumetanide 1 MG tablet Commonly known as: BUMEX   carvedilol  25 MG tablet Commonly known as: COREG    dexamethasone  4 MG tablet Commonly known as: DECADRON    donepezil  10 MG tablet Commonly known as: ARICEPT    ELIQUIS DVT/PE STARTER PACK PO   ferrous sulfate 325 (65 FE) MG tablet   fluconazole 100 MG tablet Commonly known as: DIFLUCAN   furosemide  20 MG tablet Commonly known as: LASIX    hydrOXYzine 25 MG tablet Commonly known as: ATARAX   ipratropium-albuterol  0.5-2.5 (3) MG/3ML Soln  Commonly known as: DUONEB   levofloxacin 750 MG tablet Commonly known as: LEVAQUIN   levothyroxine  112 MCG tablet Commonly known as: SYNTHROID    losartan  100 MG tablet Commonly known as: COZAAR    montelukast  10 MG tablet Commonly known as: SINGULAIR    nystatin  100000 UNIT/ML suspension Commonly known as: MYCOSTATIN    nystatin  powder Commonly known as: MYCOSTATIN /NYSTOP    omeprazole 20 MG capsule Commonly known as: PRILOSEC   saccharomyces boulardii 250 MG capsule Commonly known as: FLORASTOR   spironolactone  25 MG tablet Commonly known as: ALDACTONE    topiramate  25 MG tablet Commonly known as: TOPAMAX    traZODone  50 MG tablet Commonly known as: DESYREL    venlafaxine  XR 75 MG 24 hr capsule Commonly known as: EFFEXOR -XR       TAKE these medications    acetaminophen  325 MG tablet Commonly known as: TYLENOL  Take 650 mg by mouth every 6 (six) hours as needed for mild pain (pain score 1-3). What changed: Another medication with the same name was added. Make sure you understand how and when to take each.   acetaminophen  325 MG tablet Commonly known as: TYLENOL  Take 2 tablets (650 mg total) by mouth every 6 (six) hours as needed for fever (Fever >/= 101). What changed: You were already taking a medication with the same name, and this prescription was added. Make sure you understand how and  when to take each.   artificial tears ophthalmic solution Place 1 drop into both eyes 4 (four) times daily as needed for dry eyes.   diphenhydrAMINE -zinc  acetate cream Commonly known as: BENADRYL  Apply 1 Application topically 3 (three) times daily as needed for itching.   fentaNYL  10 mcg/ml Soln infusion Inject 0-400 mcg/hr into the vein continuous.   fentaNYL  Soln Commonly known as: SUBLIMAZE  Inject 100 mcg into the vein every 15 (fifteen) minutes as needed (uncontrolled pain, distress, or if respiratory rate greater than 18 and then titrate up infusion rate per administration instructions.).   Gerhardt's butt cream Crea Apply 1 Application topically as needed for irritation.   glycopyrrolate  1 MG tablet Commonly known as: ROBINUL  Take 1 tablet (1 mg total) by mouth every 4 (four) hours as needed (excessive secretions).   ondansetron  4 MG/2ML Soln injection Commonly known as: ZOFRAN  Inject 2 mLs (4 mg total) into the vein every 6 (six) hours as needed for nausea or vomiting.               Discharge Care Instructions  (From admission, onward)           Start     Ordered   02/08/24 0000  Discharge wound care:        02/08/24 1140             The results of significant diagnostics from this hospitalization (including imaging, microbiology, ancillary and laboratory) are listed below for reference.    Procedures and Diagnostic Studies:   VAS US  LOWER EXTREMITY VENOUS (DVT) Result Date: 01/28/2024  Lower Venous DVT Study Patient Name:  ROSALAND SHIFFMAN  Date of Exam:   01/28/2024 Medical Rec #: [id number]                      Accession #:    [id number] Date of Birth: 04-17-1946                       Patient Gender: F Patient Age:   64 years Exam Location:  [facility] Procedure:  VAS US  LOWER EXTREMITY VENOUS (DVT) Referring Phys: REDIA CLEAVER --------------------------------------------------------------------------------  Indications:  Edema.  Anticoagulation: Eliquis. Limitations: Body habitus, poor ultrasound/tissue interface and apneic breathing, small vessels. Comparison Study: Previous exam (reflux) on 03/06/2020 was negative for DVT. Performing Technologist: Ezzie Potters RVT, RDMS  Examination Guidelines: A complete evaluation includes B-mode imaging, spectral Doppler, color Doppler, and power Doppler as needed of all accessible portions of each vessel. Bilateral testing is considered an integral part of a complete examination. Limited examinations for reoccurring indications may be performed as noted. The reflux portion of the exam is performed with the patient in reverse Trendelenburg.  +--------+---------------+---------+-----------+----------+--------------------+ RIGHT   CompressibilityPhasicitySpontaneityPropertiesThrombus Aging       +--------+---------------+---------+-----------+----------+--------------------+ CFV     Full           Yes      Yes                                       +--------+---------------+---------+-----------+----------+--------------------+ SFJ     Full                                                              +--------+---------------+---------+-----------+----------+--------------------+ FV Prox Full           Yes      Yes                                       +--------+---------------+---------+-----------+----------+--------------------+ FV Mid  Full           Yes      Yes                                       +--------+---------------+---------+-----------+----------+--------------------+ FV                     Yes      Yes                  patent by            Distal                                               color/doppler        +--------+---------------+---------+-----------+----------+--------------------+ PFV     Full                                                               +--------+---------------+---------+-----------+----------+--------------------+ POP     Full           Yes      Yes                                       +--------+---------------+---------+-----------+----------+--------------------+  PTV                                                  not visualized       +--------+---------------+---------+-----------+----------+--------------------+ PERO                                                 not visualized       +--------+---------------+---------+-----------+----------+--------------------+   +---------+---------------+---------+-----------+----------+-------------------+ LEFT     CompressibilityPhasicitySpontaneityPropertiesThrombus Aging      +---------+---------------+---------+-----------+----------+-------------------+ CFV      Full           Yes      Yes                                      +---------+---------------+---------+-----------+----------+-------------------+ SFJ      Full                                                             +---------+---------------+---------+-----------+----------+-------------------+ FV Prox  Full           Yes      Yes                                      +---------+---------------+---------+-----------+----------+-------------------+ FV Mid   Full           Yes      Yes                  Not well visualized +---------+---------------+---------+-----------+----------+-------------------+ FV DistalFull           Yes      Yes                                      +---------+---------------+---------+-----------+----------+-------------------+ PFV      Full                                                             +---------+---------------+---------+-----------+----------+-------------------+ POP      Full           Yes      Yes                                      +---------+---------------+---------+-----------+----------+-------------------+  PTV  not visualized      +---------+---------------+---------+-----------+----------+-------------------+ PERO                                                  not visualized      +---------+---------------+---------+-----------+----------+-------------------+     Summary: BILATERAL: - No evidence of deep vein thrombosis seen in the lower extremities, bilaterally (in areas visualized). -No evidence of popliteal cyst, bilaterally.   *See table(s) above for measurements and observations. Electronically signed by Penne Colorado MD on 01/28/2024 at 6:54:16 PM.    Final    ECHOCARDIOGRAM COMPLETE Result Date: 01/28/2024    ECHOCARDIOGRAM REPORT   Patient Name:   ANGLIA BLAKLEY Date of Exam: 01/28/2024 Medical Rec #:  [id number]                     Height:       66.0 in Accession #:    [id number]                    Weight:       290.0 lb Date of Birth:  [birth year]                      BSA:          2.342 m Patient Age:    78 years                      BP:           122/59 mmHg Patient Gender: F                             HR:           78 bpm. Exam Location:  Inpatient Procedure: 2D Echo, Cardiac Doppler and Color Doppler (Both Spectral and Color            Flow Doppler were utilized during procedure). Indications:    Elevated Troponin  History:        Patient has no prior history of Echocardiogram examinations.                 CHF; Risk Factors:Hypertension and Sleep Apnea.  Sonographer:    Jayson Gaskins Referring Phys: [phone] REDIA SAILOR [facility]  Sonographer Comments: Suboptimal parasternal window, no apical window, no subcostal window and patient is obese. IMPRESSIONS  1. Poor acoustic windows severely limit study Overall LVEF appears normal. Cannot fully evaluate regional wall motion due to limited views.  2. Right ventricular systolic function was not well visualized. The right ventricular size is normal.  3. The mitral valve was not well  visualized. No evidence of mitral valve regurgitation.  4. The aortic valve was not well visualized. Aortic valve regurgitation is not visualized. FINDINGS  Left Ventricle: Poor acoustic windows severely limit study Overall LVEF appears normal. Cannot fully evaluate regional wall motion due to limited views. The left ventricular internal cavity size was normal in size. Eccentric left ventricular hypertrophy. Right Ventricle: The right ventricular size is normal. Right vetricular wall thickness was not assessed. Right ventricular systolic function was not well visualized. Left Atrium: Left atrial size was not well visualized. Right Atrium: Right atrial size was not well visualized. Pericardium: There is no evidence of pericardial effusion.  Presence of epicardial fat layer. Mitral Valve: The mitral valve was not well visualized. No evidence of mitral valve regurgitation. Tricuspid Valve: The tricuspid valve is not well visualized. No evidence of tricuspid stenosis. Aortic Valve: The aortic valve was not well visualized. Aortic valve regurgitation is not visualized. Pulmonic Valve: The pulmonic valve was not assessed. Pulmonic valve regurgitation is not visualized. Aorta: The aortic root was not well visualized. IAS/Shunts: The interatrial septum was not assessed.  LEFT VENTRICLE PLAX 2D LVIDd:         4.20 cm LVIDs:         2.80 cm LV PW:         1.00 cm LV IVS:        1.30 cm LVOT diam:     1.90 cm LVOT Area:     2.84 cm   SHUNTS Systemic Diam: 1.90 cm Vina Gull MD Electronically signed by Vina Gull MD Signature Date/Time: 01/28/2024/5:01:20 PM    Final    CT CHEST WO CONTRAST Result Date: 01/28/2024 CLINICAL DATA:  Pulmonary nodules EXAM: CT CHEST WITHOUT CONTRAST TECHNIQUE: Multidetector CT imaging of the chest was performed following the standard protocol without IV contrast. RADIATION DOSE REDUCTION: This exam was performed according to the departmental dose-optimization program which includes automated  exposure control, adjustment of the mA and/or kV according to patient size and/or use of iterative reconstruction technique. COMPARISON:  CT chest same day FINDINGS: Cardiovascular: No significant vascular findings. Normal heart size. No pericardial effusion. Mediastinum/Nodes: No axillary or supraclavicular adenopathy. No mediastinal or hilar adenopathy. No pericardial fluid. Esophagus normal. Lungs/Pleura: Mild biapical pleuroparenchymal thickening. Mild thickening along the RIGHT fissures. No suspicious nodularity. Upper Abdomen: Limited view of the liver, kidneys, pancreas are unremarkable. Normal adrenal glands. Musculoskeletal: No aggressive osseous lesion. IMPRESSION: 1. No suspicious pulmonary nodularity. 2. Mild pleuroparenchymal thickening at the apices and along the RIGHT fissures. Electronically Signed   By: Jackquline Boxer M.D.   On: 01/28/2024 14:36   US  RENAL Result Date: 01/28/2024 EXAM: US  Retroperitoneum Complete, Renal. CLINICAL HISTORY: 409830 AKI (acute kidney injury) (HCC) 409830. AKI (acute kidney injury) (HCC). TECHNIQUE: Real-time ultrasound of the retroperitoneum (complete) with image documentation. COMPARISON: 10/28/2022 FINDINGS: RIGHT KIDNEY: The right kidney measures 10.7 x 4.9 x 4.8 cm. The volume is 132 cc. On the previous exam, the right kidney had a volume of 178.2 cc. No hydronephrosis, renal stone or mass visualized. LEFT KIDNEY: The left kidney measures 7.4 x 4.8 x 3.6 cm. The volume is equal to 67.2 cc. On the previous exam, the left kidney had a volume of 96.5 cc. There is mild increased parenchymal echogenicity within the left kidney. No hydronephrosis, renal stone or mass visualized. BLADDER: Unremarkable as visualized. IMPRESSION: 1. No hydronephrosis or renal stone. 2. Decreased volume of both kidneys compared to the prior study, with mild increased parenchymal echogenicity in the left kidney. 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  Electronically signed by: Waddell Calk MD 01/28/2024 07:36 AM EDT RP Workstation: [id number]   CT HEAD WO CONTRAST ( ) Result Date: 01/28/2024 CLINICAL DATA:  78 year old female with altered mental status. On Eliquis. Evidence of GI or GU bleeding. EXAM: CT HEAD WITHOUT CONTRAST TECHNIQUE: Contiguous axial images were obtained from the base of the skull through the vertex without intravenous contrast. RADIATION DOSE REDUCTION: This exam was performed according to the departmental dose-optimization program which includes automated exposure control, adjustment of the mA and/or kV according to patient size and/or use of iterative reconstruction technique.  COMPARISON:  Head CT 10/27/2022. FINDINGS: Brain: Stable cerebral volume. No midline shift, ventriculomegaly, mass effect, evidence of mass lesion, intracranial hemorrhage or evidence of cortically based acute infarction. Chronic periventricular white matter hypodensity, moderate for age is stable from last year along with gray-white differentiation. Vascular: No suspicious intracranial vascular hyperdensity. Calcified atherosclerosis at the skull base. Skull: Stable and intact. Sinuses/Orbits: New right middle ear and mastoid opacification from last year. Other visualized paranasal sinuses and mastoids are stable and well aerated. Other: Stable orbit and scalp soft tissues. IMPRESSION: 1. New right middle ear and mastoid opacification from last year. Consider otitis media with mastoid effusion. 2. No acute intracranial abnormality. Stable non contrast CT appearance of chronic white matter disease. Electronically Signed   By: VEAR Hurst M.D.   On: 01/28/2024 04:06   CT CHEST ABDOMEN PELVIS WO CONTRAST Result Date: 01/28/2024 CLINICAL DATA:  Chest and abdominal pain EXAM: CT CHEST, ABDOMEN AND PELVIS WITHOUT CONTRAST TECHNIQUE: Multidetector CT imaging of the chest, abdomen and pelvis was performed following the standard protocol without IV contrast. RADIATION  DOSE REDUCTION: This exam was performed according to the departmental dose-optimization program which includes automated exposure control, adjustment of the mA and/or kV according to patient size and/or use of iterative reconstruction technique. COMPARISON:  Chest x-ray from the previous day. FINDINGS: CT CHEST FINDINGS Cardiovascular: Somewhat limited due to lack of IV contrast. Atherosclerotic calcification of the aorta is noted without aneurysmal dilatation. No cardiac enlargement is seen. No abnormality in the right perihilar region is noted to correspond with that seen on prior chest x-ray. This was felt to have been related to patient rotation to the right. Mediastinum/Nodes: Thoracic inlet is within normal limits. No hilar or mediastinal adenopathy is noted. The esophagus as visualized is within normal limits. Lungs/Pleura: Lungs are well aerated bilaterally. No focal infiltrate or effusion is seen. No parenchymal nodules are noted. Musculoskeletal: Degenerative changes of the thoracic spine are seen. No acute rib abnormality is noted. CT ABDOMEN PELVIS FINDINGS Hepatobiliary: No focal liver abnormality is seen. Status post cholecystectomy. No biliary dilatation. Pancreas: Unremarkable. No pancreatic ductal dilatation or surrounding inflammatory changes. Spleen: Normal in size without focal abnormality. Adrenals/Urinary Tract: Adrenal glands are within normal limits. Kidneys are well visualized bilaterally. No renal calculi or obstructive changes are seen. Renal arterial calcifications are noted. The bladder is decompressed. Stomach/Bowel: Fecal material is noted scattered throughout the colon. Minimal diverticular changes seen. No diverticulitis is noted. The appendix is within normal limits. Small bowel and stomach are unremarkable. Vascular/Lymphatic: Aortic atherosclerosis. No enlarged abdominal or pelvic lymph nodes. Reproductive: Status post hysterectomy. Simple appearing right adnexal cyst measuring  5.9 cm. Other: No abdominal wall hernia or abnormality. No abdominopelvic ascites. Musculoskeletal: Degenerative change of the lumbar spine is noted. No acute bony abnormality is seen. IMPRESSION: CT of the chest: No acute abnormality to correspond with that seen on recent chest x-ray. CT of the abdomen and pelvis: Diverticulosis without diverticulitis. Right adnexal simple-appearing cyst measuring 5.9 cm. Recommend follow-up pelvic ultrasound in 6-12 months. Reference: JACR 2020 Feb;17(2):248-254 Electronically Signed   By: Oneil Devonshire M.D.   On: 01/28/2024 01:05   DG Chest Port 1 View Result Date: 01/27/2024 CLINICAL DATA:  Weakness EXAM: PORTABLE CHEST 1 VIEW COMPARISON:  None Available. FINDINGS: Low lung volumes due to in adequate inspiratory force. Fullness of the right suprahilar paramediastinal region may represent an artifact from vascular structures versus a lesion, new to prior. The heart size and mediastinal contours are within  normal limits. Both lungs are otherwise no pleural effusion or pneumothorax. Clear. The visualized skeletal structures are unremarkable. IMPRESSION: Fullness of right suprahilar may represent a vascular component versus a true pulmonary/mediastinal lesion. Recommend chest CT for further assessment. Electronically Signed   By: Megan  Zare M.D.   On: 01/27/2024 19:30     Labs:   Basic Metabolic Panel: No results for input(s): NA, K, CL, CO2, GLUCOSE, BUN, CREATININE, CALCIUM, MG, PHOS in the last 168 hours. GFR Estimated Creatinine Clearance: 39.2 mL/min (A) (by C-G formula based on SCr of 1.64 mg/dL (H)). Liver Function Tests: No results for input(s): AST, ALT, ALKPHOS, BILITOT, PROT, ALBUMIN  in the last 168 hours. No results for input(s): LIPASE, AMYLASE in the last 168 hours. No results for input(s): AMMONIA in the last 168 hours. Coagulation profile No results for input(s): INR, PROTIME in the last 168  hours.  CBC: No results for input(s): WBC, NEUTROABS, HGB, HCT, MCV, PLT in the last 168 hours. Cardiac Enzymes: No results for input(s): CKTOTAL, CKMB, CKMBINDEX, TROPONINI in the last 168 hours. BNP: Invalid input(s): POCBNP CBG: Recent Labs  Lab 02/03/24 0804  GLUCAP 73   D-Dimer No results for input(s): DDIMER in the last 72 hours. Hgb A1c No results for input(s): HGBA1C in the last 72 hours. Lipid Profile No results for input(s): CHOL, HDL, LDLCALC, TRIG, CHOLHDL, LDLDIRECT in the last 72 hours. Thyroid function studies No results for input(s): TSH, T4TOTAL, T3FREE, THYROIDAB in the last 72 hours.  Invalid input(s): FREET3 Anemia work up No results for input(s): VITAMINB12, FOLATE, FERRITIN, TIBC, IRON, RETICCTPCT in the last 72 hours. Microbiology Recent Results (from the past 240 hours)  Culture, blood (Routine X 2) w Reflex to ID Panel     Status: None   Collection Time: 02/01/24 12:07 PM   Specimen: BLOOD RIGHT ARM  Result Value Ref Range Status   Specimen Description BLOOD RIGHT ARM  Final   Special Requests   Final    BOTTLES DRAWN AEROBIC ONLY Blood Culture results may not be optimal due to an inadequate volume of blood received in culture bottles   Culture   Final    NO GROWTH 5 DAYS Performed at [facility], [address], [place], [place] [address]    Report Status 02/06/2024 FINAL  Final  Culture, blood (Routine X 2) w Reflex to ID Panel     Status: None   Collection Time: 02/01/24 12:07 PM   Specimen: BLOOD RIGHT ARM  Result Value Ref Range Status   Specimen Description BLOOD RIGHT ARM  Final   Special Requests   Final    BOTTLES DRAWN AEROBIC ONLY Blood Culture results may not be optimal due to an inadequate volume of blood received in culture bottles   Culture   Final    NO GROWTH 5 DAYS Performed at [facility], [address] [place], [place] [address]    Report Status  02/06/2024 FINAL  Final    Time coordinating discharge: 45 minutes  Signed: Anaiah Mcmannis  [facility] 02/09/2024, 11:17 AM

## 2024-02-08 NOTE — Progress Notes (Signed)
 Daily Progress Note   Patient Name: [name] [facility] [name]       Date: 02/08/2024 DOB: Dec 26, 1945  Age: 78 y.o. MRN#: [id number] Attending Physician: Arlice Reichert, MD Primary Care Physician: Lenon Layman ORN, MD Admit Date: 01/27/2024  Reason for Consultation/Follow-up: Terminal Care  Subjective: Medical records reviewed including progress notes and MAR. No PRN boluses og fentanyl  or other comfort medications required in the past 24 hours. Patient assessed at the bedside. She was breathing comfortably, staring with eyes open while she slept. She aroused to my touch and voice, nodding in response to my question about her comfort. She whispered water which I provided and she took a few sips of before going back to sleep. No visitors were present.  PMT will continue to support holistically.   Length of Stay: 11   Physical Exam Vitals and nursing note reviewed.  Constitutional:      General: She is not in acute distress.    Appearance: She is ill-appearing.     Comments: Opens eyes to voice, no nonverbal s/s of pain or discomfort  Cardiovascular:     Rate and Rhythm: Normal rate.  Pulmonary:     Effort: Pulmonary effort is normal.  Skin:    General: Skin is warm and dry.  Neurological:     Mental Status: She is easily aroused.           Vital Signs: BP (!) 84/50 (BP Location: Left Leg)   Pulse 81   Temp 98.5 F (36.9 C)   Resp 16   Ht 5' 6 (1.676 m)   Wt 130.5 kg   SpO2 95%   BMI 46.42 kg/m  SpO2: SpO2: 95 % O2 Device: O2 Device: Room Air O2 Flow Rate: O2 Flow Rate (L/min): (S) 6 L/min       Palliative Assessment/Data: PPS 10%   Palliative Care Assessment & Plan   HPI: 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 Family meeting held on 8/19 and goals are geared towards comfort. The PMT is involved to offer ongoing support of symptoms.   Assessment: End of life care  Recommendations/Plan: Continue DNR/DNI Continue comfort measures only including fentanyl  infusion at 75 mcg with boluses prn PMT will continue to follow   Symptom Management:  Tylenol  650 mg PR every 6 hours as needed fever Artificial tears 1 drop OU 4 times daily as needed dry eyes Fentanyl  infusion 0 to 400 mcg/h titrate per instructions Fentanyl  bolus via infusion 100 mcg IV every 15 minutes as needed uncontrolled pain, distress, RR greater than 18 Robinul  0.2 mg IV every 4 hours as needed excessive secretions Zofran  4 mg IV every 6 hours as needed nausea or vomiting    Prognosis:  Hours - Days  Discharge Planning: Anticipated Hospital Death vs hospice facility if family agreeable, defer to [facility]    Nevaen Tredway, PA-C Palliative Medicine Team Team phone # [phone]  Thank you for allowing the Palliative  Medicine Team to assist in the care of this patient. Please utilize secure chat with additional questions, if there is no response within 30 minutes please call the above phone number.  Palliative Medicine Team providers are available by phone from 7am to 7pm daily and can be reached through the team cell phone.  Should this patient require assistance outside of these hours, please call the patient's attending physician.  Portions of this note are a verbal dictation therefore any spelling and/or grammatical errors are due to the Dragon Medical One system interpretation.

## 2024-02-09 DIAGNOSIS — R4189 Other symptoms and signs involving cognitive functions and awareness: Secondary | ICD-10-CM

## 2024-02-09 DIAGNOSIS — S069XAS Unspecified intracranial injury with loss of consciousness status unknown, sequela: Secondary | ICD-10-CM

## 2024-02-09 DIAGNOSIS — R4182 Altered mental status, unspecified: Secondary | ICD-10-CM | POA: Diagnosis not present

## 2024-02-09 DIAGNOSIS — K922 Gastrointestinal hemorrhage, unspecified: Secondary | ICD-10-CM | POA: Diagnosis not present

## 2024-02-09 DIAGNOSIS — G9349 Other encephalopathy: Secondary | ICD-10-CM | POA: Diagnosis not present

## 2024-02-09 DIAGNOSIS — Z515 Encounter for palliative care: Secondary | ICD-10-CM | POA: Diagnosis not present

## 2024-02-09 NOTE — Progress Notes (Signed)
 Pt to discharge to [facility] report called and given to [facility].

## 2024-02-09 NOTE — Plan of Care (Signed)
  Problem: Pain Managment: Goal: General experience of comfort will improve and/or be controlled Outcome: Progressing   Problem: Safety: Goal: Ability to remain free from injury will improve Outcome: Progressing   Problem: Skin Integrity: Goal: Risk for impaired skin integrity will decrease Outcome: Progressing

## 2024-02-09 NOTE — Progress Notes (Signed)
 Pt discharged and Fentanyl  2500mcg drip was discontinued and wasted on 02/09/24 @ 1500. The amount wasted in the stericycle was 207.7 ml by this writer Steen Virgene PEAK and was witnessed by [employer], RN.

## 2024-02-09 NOTE — Progress Notes (Signed)
 Patient was prepared for discharge to residential hospice yesterday but it could not happen because of family could not sign the consent till late afternoon. Process has completed today. Seen and examined briefly this morning.  On fentanyl  drip, opens eyes on command.  Comfortable and falls right back asleep. Patient can be discharged today to residential hospice. [facility] will not bill for today.

## 2024-02-09 NOTE — Progress Notes (Signed)
 Daily Progress Note   Patient Name: [name] [facility] [name]       Date: 02/09/2024 DOB: January 20, 1946  Age: 78 y.o. MRN#: [id number] Attending Physician: Arlice Reichert, MD Primary Care Physician: Lenon Layman ORN, MD Admit Date: 01/27/2024  Reason for Consultation/Follow-up: Terminal Care  Subjective: Medical records reviewed including progress notes and MAR. No PRN boluses og fentanyl  or other comfort medications required in the past 24 hours. Patient fentanyl  gtt decreased to 10 mcg/hr overnight by nursing.   Patient assessed at the bedside. No signs of pain or distress. Discussed with primary RN's this morning. No concerns at this time. Discussed returning to previous rate of 75 mcg/hr. Await possible transfer to hospice facility.   PMT will continue to support holistically.   Length of Stay: 12   Physical Exam Vitals and nursing note reviewed.  Constitutional:      General: She is not in acute distress.    Appearance: She is ill-appearing.     Comments: Opens eyes to voice, no nonverbal s/s of pain or discomfort  Cardiovascular:     Rate and Rhythm: Normal rate.  Pulmonary:     Effort: Pulmonary effort is normal.  Skin:    General: Skin is warm and dry.  Neurological:     Mental Status: She is easily aroused.           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 Family meeting held on 8/19 and goals are geared towards comfort. The PMT is involved to offer ongoing support of symptoms.   Assessment: End of life care  Recommendations/Plan: Continue DNR/DNI Continue comfort measures only including fentanyl  infusion at 75 mcg with boluses prn PMT will continue to follow   Symptom Management:  Tylenol  650 mg PR every 6 hours as needed fever Artificial tears 1 drop OU 4 times daily as needed dry eyes Fentanyl  infusion 75 to 400 mcg/h titrate per instructions Fentanyl  bolus via infusion 100 mcg IV every 15 minutes as needed uncontrolled pain, distress, RR greater than 18 Robinul  0.2 mg IV every 4 hours as needed excessive secretions Zofran  4 mg IV every 6 hours as needed nausea or vomiting    Prognosis:  Hours - Days  Discharge Planning: Anticipated Hospital Death vs hospice facility    Zyon Grout, PA-C Palliative Medicine Team Team phone # [phone]  Thank you for allowing the Palliative Medicine Team to assist in the care of this  patient. Please utilize secure chat with additional questions, if there is no response within 30 minutes please call the above phone number.  Palliative Medicine Team providers are available by phone from 7am to 7pm daily and can be reached through the team cell phone.  Should this patient require assistance outside of these hours, please call the patient's attending physician.  Portions of this note are a verbal dictation therefore any spelling and/or grammatical errors are due to the Dragon Medical One system interpretation.

## 2024-02-09 NOTE — TOC Progression Note (Signed)
 Transition of Care [facility]) - Progression Note    Patient Details  Name: [name] MRN: [id number] Date of Birth: August 05, 1945  Transition of Care [facility] CM/SW Contact  Rosaline JONELLE Joe, RN Phone Number: 02/09/2024, 11:37 AM  Clinical Narrative:    CM spoke with [facility] and the facility has available bed for the patient today.  [facility] was called for transport the facility. Bedside nursing will call report to the facility and is aware of patient transfer to the facility.   Expected Discharge Plan: Long Term Nursing Home Barriers to Discharge: Continued Medical Work up               Expected Discharge Plan and Services In-house Referral: Clinical Social Work   Post Acute Care Choice: Skilled Nursing Facility Living arrangements for the past 2 months: Skilled Nursing Facility Expected Discharge Date: 02/09/24                                     Social Drivers of Health (SDOH) Interventions SDOH Screenings   Food Insecurity: Patient Unable To Answer (01/29/2024)  Housing: Patient Unable To Answer (01/29/2024)  Transportation Needs: Patient Unable To Answer (01/29/2024)  Utilities: Patient Unable To Answer (01/29/2024)  Depression (PHQ2-9): Low Risk  (11/11/2022)  Social Connections: Patient Unable To Answer (01/29/2024)  Tobacco Use: Low Risk  (01/28/2024)    Readmission Risk Interventions     No data to display

## 2024-02-11 NOTE — Progress Notes (Signed)
 Email ADT - DW MOD - MSSP - CCM  MSSP CCM Inpatient and ED at [facility] 03-08-24 (Thursday)  Recent hospital discharge Location: [facility]. Discharge date: 02/09/2024.     [facility] - Unable To Enroll Patient  A [facility] Coordinator has been unable to reach patient after multiple attempts.   Kimyata J Hollifield [name], was identified by admission/discharge/transfer alert as a candidate for [facility].   At this time, GAYE SCORZA [name] is not yet enrolled in [facility] care management services.  Please consider discussing the benefits of [facility] with Rock JINNY [place] [name] and referring again at a later date.  ALLAN HERNANDEZ    [address]; [place], [place] [address] l  [email or web address] l [phone]   For more information on [facility] services, click here.

## 2024-02-14 DEATH — deceased
